# Patient Record
Sex: Female | Born: 1965 | Race: White | Hispanic: No | Marital: Single | Smoking: Former smoker
Health system: Southern US, Community
[De-identification: ages and names within clinical notes are randomized; demographics above are authoritative.]

## PROBLEM LIST (undated history)

## (undated) DIAGNOSIS — I7300 Raynaud's syndrome without gangrene: Secondary | ICD-10-CM

## (undated) DIAGNOSIS — N83299 Other ovarian cyst, unspecified side: Secondary | ICD-10-CM

## (undated) DIAGNOSIS — B029 Zoster without complications: Secondary | ICD-10-CM

## (undated) DIAGNOSIS — R7301 Impaired fasting glucose: Secondary | ICD-10-CM

## (undated) DIAGNOSIS — K589 Irritable bowel syndrome without diarrhea: Secondary | ICD-10-CM

## (undated) DIAGNOSIS — E7800 Pure hypercholesterolemia, unspecified: Secondary | ICD-10-CM

## (undated) DIAGNOSIS — K56609 Unspecified intestinal obstruction, unspecified as to partial versus complete obstruction: Secondary | ICD-10-CM

## (undated) DIAGNOSIS — B9689 Other specified bacterial agents as the cause of diseases classified elsewhere: Secondary | ICD-10-CM

## (undated) DIAGNOSIS — Z8719 Personal history of other diseases of the digestive system: Secondary | ICD-10-CM

## (undated) DIAGNOSIS — F329 Major depressive disorder, single episode, unspecified: Secondary | ICD-10-CM

## (undated) DIAGNOSIS — J45909 Unspecified asthma, uncomplicated: Secondary | ICD-10-CM

## (undated) DIAGNOSIS — F32A Depression, unspecified: Secondary | ICD-10-CM

## (undated) DIAGNOSIS — R011 Cardiac murmur, unspecified: Secondary | ICD-10-CM

## (undated) DIAGNOSIS — G4730 Sleep apnea, unspecified: Secondary | ICD-10-CM

## (undated) DIAGNOSIS — N816 Rectocele: Secondary | ICD-10-CM

## (undated) DIAGNOSIS — R7303 Prediabetes: Secondary | ICD-10-CM

## (undated) DIAGNOSIS — M1990 Unspecified osteoarthritis, unspecified site: Secondary | ICD-10-CM

## (undated) DIAGNOSIS — N760 Acute vaginitis: Secondary | ICD-10-CM

## (undated) DIAGNOSIS — E559 Vitamin D deficiency, unspecified: Secondary | ICD-10-CM

## (undated) DIAGNOSIS — IMO0002 Reserved for concepts with insufficient information to code with codable children: Secondary | ICD-10-CM

## (undated) DIAGNOSIS — K219 Gastro-esophageal reflux disease without esophagitis: Secondary | ICD-10-CM

## (undated) DIAGNOSIS — M797 Fibromyalgia: Secondary | ICD-10-CM

## (undated) DIAGNOSIS — R42 Dizziness and giddiness: Secondary | ICD-10-CM

## (undated) DIAGNOSIS — B379 Candidiasis, unspecified: Secondary | ICD-10-CM

## (undated) DIAGNOSIS — R339 Retention of urine, unspecified: Secondary | ICD-10-CM

## (undated) DIAGNOSIS — Z205 Contact with and (suspected) exposure to viral hepatitis: Secondary | ICD-10-CM

## (undated) DIAGNOSIS — I10 Essential (primary) hypertension: Secondary | ICD-10-CM

## (undated) DIAGNOSIS — D4701 Cutaneous mastocytosis: Secondary | ICD-10-CM

## (undated) DIAGNOSIS — N951 Menopausal and female climacteric states: Secondary | ICD-10-CM

## (undated) DIAGNOSIS — R002 Palpitations: Secondary | ICD-10-CM

## (undated) DIAGNOSIS — F419 Anxiety disorder, unspecified: Secondary | ICD-10-CM

## (undated) DIAGNOSIS — R079 Chest pain, unspecified: Secondary | ICD-10-CM

## (undated) DIAGNOSIS — N815 Vaginal enterocele: Secondary | ICD-10-CM

## (undated) DIAGNOSIS — Z974 Presence of external hearing-aid: Secondary | ICD-10-CM

## (undated) DIAGNOSIS — E039 Hypothyroidism, unspecified: Secondary | ICD-10-CM

## (undated) DIAGNOSIS — M7918 Myalgia, other site: Secondary | ICD-10-CM

## (undated) DIAGNOSIS — M75100 Unspecified rotator cuff tear or rupture of unspecified shoulder, not specified as traumatic: Secondary | ICD-10-CM

## (undated) DIAGNOSIS — R638 Other symptoms and signs concerning food and fluid intake: Secondary | ICD-10-CM

## (undated) DIAGNOSIS — U071 COVID-19: Secondary | ICD-10-CM

## (undated) DIAGNOSIS — N200 Calculus of kidney: Secondary | ICD-10-CM

## (undated) DIAGNOSIS — E876 Hypokalemia: Secondary | ICD-10-CM

## (undated) DIAGNOSIS — J3489 Other specified disorders of nose and nasal sinuses: Secondary | ICD-10-CM

## (undated) DIAGNOSIS — J449 Chronic obstructive pulmonary disease, unspecified: Secondary | ICD-10-CM

## (undated) HISTORY — DX: Anxiety disorder, unspecified: F41.9

## (undated) HISTORY — PX: TONSILLECTOMY: SUR1361

## (undated) HISTORY — PX: APPENDECTOMY: SHX54

## (undated) HISTORY — DX: Acute vaginitis: N76.0

## (undated) HISTORY — DX: Sleep apnea, unspecified: G47.30

## (undated) HISTORY — PX: LITHOTRIPSY: SUR834

## (undated) HISTORY — DX: Cutaneous mastocytosis: D47.01

## (undated) HISTORY — DX: Vaginal enterocele: N81.5

## (undated) HISTORY — DX: Unspecified osteoarthritis, unspecified site: M19.90

## (undated) HISTORY — DX: Pure hypercholesterolemia, unspecified: E78.00

## (undated) HISTORY — DX: Rectocele: N81.6

## (undated) HISTORY — DX: Chest pain, unspecified: R07.9

## (undated) HISTORY — DX: Depression, unspecified: F32.A

## (undated) HISTORY — PX: UPPER GASTROINTESTINAL ENDOSCOPY: SHX188

## (undated) HISTORY — DX: Unspecified intestinal obstruction, unspecified as to partial versus complete obstruction: K56.609

## (undated) HISTORY — DX: Vitamin D deficiency, unspecified: E55.9

## (undated) HISTORY — PX: OOPHORECTOMY: SHX86

## (undated) HISTORY — DX: Other specified disorders of nose and nasal sinuses: J34.89

## (undated) HISTORY — PX: OTHER SURGICAL HISTORY: SHX169

## (undated) HISTORY — PX: COLONOSCOPY: SHX174

## (undated) HISTORY — DX: Essential (primary) hypertension: I10

## (undated) HISTORY — PX: ABDOMINAL HYSTERECTOMY: SHX81

## (undated) HISTORY — DX: Other symptoms and signs concerning food and fluid intake: R63.8

## (undated) HISTORY — DX: Fibromyalgia: M79.7

## (undated) HISTORY — DX: Menopausal and female climacteric states: N95.1

## (undated) HISTORY — DX: Retention of urine, unspecified: R33.9

## (undated) HISTORY — DX: Candidiasis, unspecified: B37.9

## (undated) HISTORY — PX: ANKLE SURGERY: SHX546

## (undated) HISTORY — DX: Prediabetes: R73.03

## (undated) HISTORY — DX: Gastro-esophageal reflux disease without esophagitis: K21.9

## (undated) HISTORY — DX: Contact with and (suspected) exposure to viral hepatitis: Z20.5

## (undated) HISTORY — DX: Raynaud's syndrome without gangrene: I73.00

## (undated) HISTORY — DX: Hypokalemia: E87.6

## (undated) HISTORY — DX: Reserved for concepts with insufficient information to code with codable children: IMO0002

## (undated) HISTORY — DX: Impaired fasting glucose: R73.01

## (undated) HISTORY — DX: Palpitations: R00.2

## (undated) HISTORY — PX: PARTIAL HYSTERECTOMY: SHX80

## (undated) HISTORY — DX: Major depressive disorder, single episode, unspecified: F32.9

## (undated) HISTORY — DX: Other specified bacterial agents as the cause of diseases classified elsewhere: B96.89

---
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
## 2004-10-14 ENCOUNTER — Ambulatory Visit: Payer: Self-pay | Admitting: Family Medicine

## 2004-11-23 ENCOUNTER — Ambulatory Visit: Payer: Self-pay | Admitting: Family Medicine

## 2005-02-18 ENCOUNTER — Ambulatory Visit: Payer: Self-pay | Admitting: Family Medicine

## 2005-08-16 ENCOUNTER — Ambulatory Visit: Payer: Self-pay | Admitting: Family Medicine

## 2005-09-13 ENCOUNTER — Ambulatory Visit: Payer: Self-pay | Admitting: Family Medicine

## 2005-10-04 ENCOUNTER — Ambulatory Visit: Payer: Self-pay | Admitting: Family Medicine

## 2005-12-23 ENCOUNTER — Ambulatory Visit: Payer: Self-pay | Admitting: Family Medicine

## 2008-07-25 ENCOUNTER — Encounter: Admission: RE | Admit: 2008-07-25 | Discharge: 2008-07-25 | Payer: Self-pay | Admitting: Family Medicine

## 2013-08-31 DIAGNOSIS — G4700 Insomnia, unspecified: Secondary | ICD-10-CM | POA: Insufficient documentation

## 2013-11-01 HISTORY — PX: COLPORRHAPHY: SHX921

## 2014-02-13 ENCOUNTER — Ambulatory Visit: Payer: Self-pay | Admitting: Obstetrics and Gynecology

## 2014-02-13 LAB — BASIC METABOLIC PANEL
ANION GAP: 6 — AB (ref 7–16)
BUN: 26 mg/dL — ABNORMAL HIGH (ref 7–18)
CALCIUM: 10.3 mg/dL — AB (ref 8.5–10.1)
CHLORIDE: 94 mmol/L — AB (ref 98–107)
CO2: 36 mmol/L — AB (ref 21–32)
Creatinine: 1.03 mg/dL (ref 0.60–1.30)
Glucose: 116 mg/dL — ABNORMAL HIGH (ref 65–99)
Osmolality: 278 (ref 275–301)
Potassium: 2.6 mmol/L — ABNORMAL LOW (ref 3.5–5.1)
SODIUM: 136 mmol/L (ref 136–145)

## 2014-02-13 LAB — CBC
HCT: 43.6 % (ref 35.0–47.0)
HGB: 14.3 g/dL (ref 12.0–16.0)
MCH: 27.9 pg (ref 26.0–34.0)
MCHC: 32.8 g/dL (ref 32.0–36.0)
MCV: 85 fL (ref 80–100)
Platelet: 408 10*3/uL (ref 150–440)
RBC: 5.12 10*6/uL (ref 3.80–5.20)
RDW: 13.9 % (ref 11.5–14.5)
WBC: 8.3 10*3/uL (ref 3.6–11.0)

## 2014-02-15 ENCOUNTER — Emergency Department: Payer: Self-pay | Admitting: Emergency Medicine

## 2014-02-15 LAB — BASIC METABOLIC PANEL
ANION GAP: 8 (ref 7–16)
BUN: 27 mg/dL — ABNORMAL HIGH (ref 7–18)
CALCIUM: 11.1 mg/dL — AB (ref 8.5–10.1)
CHLORIDE: 93 mmol/L — AB (ref 98–107)
CREATININE: 0.94 mg/dL (ref 0.60–1.30)
Co2: 35 mmol/L — ABNORMAL HIGH (ref 21–32)
EGFR (Non-African Amer.): >60
Glucose: 121 mg/dL — ABNORMAL HIGH (ref 65–99)
OSMOLALITY: 278 (ref 275–301)
Potassium: 2.6 mmol/L — ABNORMAL LOW (ref 3.5–5.1)
SODIUM: 136 mmol/L (ref 136–145)

## 2014-02-15 LAB — TROPONIN I: Troponin-I: <0.02 ng/mL

## 2014-02-15 LAB — MAGNESIUM: Magnesium: 1.5 mg/dL — ABNORMAL LOW

## 2014-02-15 LAB — POTASSIUM: Potassium: 3.2 mmol/L — ABNORMAL LOW (ref 3.5–5.1)

## 2014-02-18 ENCOUNTER — Ambulatory Visit: Payer: Self-pay | Admitting: Obstetrics and Gynecology

## 2014-02-19 LAB — ELECTROLYTE PANEL
ANION GAP: 8 (ref 7–16)
CHLORIDE: 102 mmol/L (ref 98–107)
CO2: 27 mmol/L (ref 21–32)
Potassium: 3.5 mmol/L (ref 3.5–5.1)
SODIUM: 137 mmol/L (ref 136–145)

## 2014-02-19 LAB — HEMOGLOBIN: HGB: 11.2 g/dL — AB (ref 12.0–16.0)

## 2014-02-21 LAB — URINE CULTURE

## 2014-06-12 ENCOUNTER — Inpatient Hospital Stay: Payer: Self-pay | Admitting: Internal Medicine

## 2014-06-12 LAB — MAGNESIUM
MAGNESIUM: 1 mg/dL — AB
Magnesium: 1.9 mg/dL

## 2014-06-12 LAB — DIFFERENTIAL
Basophil #: 0 10*3/uL (ref 0.0–0.1)
Basophil %: 0.2 %
Eosinophil #: 0 10*3/uL (ref 0.0–0.7)
Eosinophil %: 0.3 %
LYMPHS PCT: 17.6 %
Lymphocyte #: 2.3 10*3/uL (ref 1.0–3.6)
Monocyte #: 0.6 x10 3/mm (ref 0.2–0.9)
Monocyte %: 4.5 %
NEUTROS PCT: 77.4 %
Neutrophil #: 10 10*3/uL — ABNORMAL HIGH (ref 1.4–6.5)

## 2014-06-12 LAB — TROPONIN I
TROPONIN-I: 0.04 ng/mL
Troponin-I: 0.04 ng/mL
Troponin-I: 0.04 ng/mL

## 2014-06-12 LAB — CBC
HCT: 42.7 % (ref 35.0–47.0)
HGB: 14 g/dL (ref 12.0–16.0)
MCH: 27.7 pg (ref 26.0–34.0)
MCHC: 32.7 g/dL (ref 32.0–36.0)
MCV: 85 fL (ref 80–100)
Platelet: 374 10*3/uL (ref 150–440)
RBC: 5.04 10*6/uL (ref 3.80–5.20)
RDW: 13.8 % (ref 11.5–14.5)
WBC: 13 10*3/uL — AB (ref 3.6–11.0)

## 2014-06-12 LAB — BASIC METABOLIC PANEL
Anion Gap: 10 (ref 7–16)
BUN: 15 mg/dL (ref 7–18)
CALCIUM: 8.8 mg/dL (ref 8.5–10.1)
CHLORIDE: 91 mmol/L — AB (ref 98–107)
CREATININE: 1 mg/dL (ref 0.60–1.30)
Co2: 34 mmol/L — ABNORMAL HIGH (ref 21–32)
GLUCOSE: 148 mg/dL — AB (ref 65–99)
Osmolality: 274 (ref 275–301)
Potassium: 2 mmol/L — CL (ref 3.5–5.1)
Sodium: 135 mmol/L — ABNORMAL LOW (ref 136–145)

## 2014-06-12 LAB — CK-MB: CK-MB: <0.5 ng/mL — ABNORMAL LOW (ref 0.5–3.6)

## 2014-06-12 LAB — HEMOGLOBIN: HGB: 13 g/dL (ref 12.0–16.0)

## 2014-06-13 LAB — BASIC METABOLIC PANEL
ANION GAP: 7 (ref 7–16)
BUN: 14 mg/dL (ref 7–18)
CHLORIDE: 102 mmol/L (ref 98–107)
Calcium, Total: 7.9 mg/dL — ABNORMAL LOW (ref 8.5–10.1)
Co2: 32 mmol/L (ref 21–32)
Creatinine: 0.77 mg/dL (ref 0.60–1.30)
GLUCOSE: 103 mg/dL — AB (ref 65–99)
OSMOLALITY: 282 (ref 275–301)
Potassium: 3.8 mmol/L (ref 3.5–5.1)
Sodium: 141 mmol/L (ref 136–145)

## 2014-06-13 LAB — CBC WITH DIFFERENTIAL/PLATELET
BASOS ABS: 0 10*3/uL (ref 0.0–0.1)
BASOS PCT: 0.3 %
EOS PCT: 1.3 %
Eosinophil #: 0.1 10*3/uL (ref 0.0–0.7)
HCT: 38.9 % (ref 35.0–47.0)
HGB: 13.2 g/dL (ref 12.0–16.0)
LYMPHS PCT: 35.7 %
Lymphocyte #: 3.6 10*3/uL (ref 1.0–3.6)
MCH: 28.5 pg (ref 26.0–34.0)
MCHC: 33.9 g/dL (ref 32.0–36.0)
MCV: 84 fL (ref 80–100)
MONO ABS: 0.6 x10 3/mm (ref 0.2–0.9)
Monocyte %: 6.1 %
NEUTROS PCT: 56.6 %
Neutrophil #: 5.6 10*3/uL (ref 1.4–6.5)
Platelet: 347 10*3/uL (ref 150–440)
RBC: 4.61 10*6/uL (ref 3.80–5.20)
RDW: 13.8 % (ref 11.5–14.5)
WBC: 10 10*3/uL (ref 3.6–11.0)

## 2014-06-13 LAB — TSH: THYROID STIMULATING HORM: 2.85 u[IU]/mL

## 2014-06-13 LAB — CK-MB: CK-MB: 0.6 ng/mL (ref 0.5–3.6)

## 2014-06-13 LAB — MAGNESIUM: MAGNESIUM: 1.8 mg/dL

## 2014-12-03 DIAGNOSIS — E039 Hypothyroidism, unspecified: Secondary | ICD-10-CM | POA: Insufficient documentation

## 2014-12-03 DIAGNOSIS — J4540 Moderate persistent asthma, uncomplicated: Secondary | ICD-10-CM | POA: Insufficient documentation

## 2014-12-03 DIAGNOSIS — I10 Essential (primary) hypertension: Secondary | ICD-10-CM | POA: Insufficient documentation

## 2014-12-03 DIAGNOSIS — E559 Vitamin D deficiency, unspecified: Secondary | ICD-10-CM | POA: Insufficient documentation

## 2014-12-03 DIAGNOSIS — J45901 Unspecified asthma with (acute) exacerbation: Secondary | ICD-10-CM | POA: Insufficient documentation

## 2014-12-03 DIAGNOSIS — F419 Anxiety disorder, unspecified: Secondary | ICD-10-CM | POA: Insufficient documentation

## 2014-12-03 DIAGNOSIS — J4530 Mild persistent asthma, uncomplicated: Secondary | ICD-10-CM | POA: Insufficient documentation

## 2014-12-03 DIAGNOSIS — J4520 Mild intermittent asthma, uncomplicated: Secondary | ICD-10-CM | POA: Insufficient documentation

## 2014-12-03 HISTORY — DX: Vitamin D deficiency, unspecified: E55.9

## 2015-02-21 DIAGNOSIS — R339 Retention of urine, unspecified: Secondary | ICD-10-CM | POA: Insufficient documentation

## 2015-02-21 DIAGNOSIS — F419 Anxiety disorder, unspecified: Secondary | ICD-10-CM

## 2015-02-21 DIAGNOSIS — N951 Menopausal and female climacteric states: Secondary | ICD-10-CM | POA: Insufficient documentation

## 2015-02-21 DIAGNOSIS — F329 Major depressive disorder, single episode, unspecified: Secondary | ICD-10-CM | POA: Insufficient documentation

## 2015-02-21 DIAGNOSIS — E669 Obesity, unspecified: Secondary | ICD-10-CM | POA: Insufficient documentation

## 2015-02-21 DIAGNOSIS — N815 Vaginal enterocele: Secondary | ICD-10-CM | POA: Insufficient documentation

## 2015-02-21 DIAGNOSIS — IMO0002 Reserved for concepts with insufficient information to code with codable children: Secondary | ICD-10-CM | POA: Insufficient documentation

## 2015-02-21 DIAGNOSIS — N816 Rectocele: Secondary | ICD-10-CM | POA: Insufficient documentation

## 2015-02-22 NOTE — H&P (Signed)
PATIENT NAME:  Laura [name], Laura [name] MR#:  [id number] DATE OF BIRTH:  1966-07-02  DATE OF ADMISSION:  06/12/2014  PRIMARY CARE PROVIDER: [facility].  EMERGENCY DEPARTMENT REFERRING PHYSICIAN: Dr. Conni Slipper.   CHIEF COMPLAINT: Weakness, chest pain, and leg cramps.   HISTORY OF PRESENT ILLNESS: The patient is a 49 year old white female with history of hyperlipidemia, chronic lower extremity edema, hypothyroidism, hypertension, asthma, anxiety, and panic attacks who is on chronic Lasix therapy for lower extremity swelling, who states that she has been admitted multiple times to [facility], as well as hospital in [place] for severe hypokalemia related to her diuretic.  She reports that whenever her potassium decreases, she starts having chest pain, leg cramps, and weakness. She started having these symptoms since yesterday. She started having leg cramps yesterday and then started feeling very weak around 11:30 a.m. When patient arrived to the ED, her potassium was noted to be 2.0 and her magnesium was 1.0. The patient, otherwise, denies any fevers or chills. No shortness of breath. She does complain of substernal chest pain which she states that she gets whenever she has her potassium low like this. She has had some nausea, but no vomiting or diarrhea.  PAST MEDICAL HISTORY: Significant for:  1.  Hyperlipidemia. 2.  History of chronic lower extremity edema.  3.  Hypothyroidism.  4.  Hypertension.  5.  Asthma.  6.  History of anxiety.  7.  History of panic attacks.   PAST SURGICAL HISTORY:  1.  Status post ankle surgery.  2.  Partial hysterectomy.  3.  Appendectomy.  4.  D and C. 5.  Bilateral tubal ligation. 6.  Thumb surgery. 7.  Lithotripsy x 17. 8.  Tonsillectomy.   ALLERGIES: SHELLFISH, DEMEROL, AND CODEINE.   CURRENT MEDICATIONS:  1.  Xanax 1 mg tablet at bedtime. 2.  Wellbutrin 300 mg 1 tab p.o. at bedtime. 3.  Voltaren topically applied to affected area  as needed. 4.  Vitamin D2 50,000 international units once a week.  5.  Tramadol 50 q. 4 p.r.n..  6.  Protonix 40 daily.  7.  Lipitor 40 at bedtime.  8.  Levothyroxine 112 mcg daily.  9.  Lasix 60 one tab p.o. b.i.d. 10.  Klor-Con 20 mEq 2 tabs 4 times a day. 11.  Diovan 160 daily.  12.  Chlorthalidone 25 mg 1 tab p.o. daily as needed.  13.  Adderall 20 mg 1 tab p.o. b.i.d. p.r.n.   SOCIAL HISTORY: Does not smoke. Does not drink. No drugs.   FAMILY HISTORY: Positive for hypertension.   REVIEW OF SYSTEMS:  CONSTITUTIONAL: Complains of fatigue and weakness. No pain. No weight loss. No weight gain.  EYES: No blurred or double vision. No pain. No redness. No inflammation. No glaucoma. No cataracts.  EAR, NOSE, AND THROAT: No ear pain. No hearing loss. No seasonal or year-round allergies. No difficulty swallowing.  RESPIRATORY: Denies any cough, wheezing, or hemoptysis.  CARDIOVASCULAR: Complains of chest pain, but no orthopnea, edema, or arrhythmia.  GASTROINTESTINAL: Complains of some nausea, but no vomiting or diarrhea. No abdominal pain. No hematemesis. No melena. No ulcer. No GERD. No jaundice.  GENITOURINARY: Denies any dysuria, hematuria, renal calculus or frequency.  ENDOCRINE: Denies any polyuria or nocturia. Does have hypothyroidism.  HEMATOLOGY AND LYMPHATICS: Denies anemia, easy bruisability or bleeding.  SKIN: No acne. No rash.  MUSCULOSKELETAL: Denies any pain in the neck, back, or shoulder.  NEUROLOGIC: No numbness, CVA, TIA, or seizures.  PSYCHIATRIC: No  anxiety, insomnia, or ADD.   PHYSICAL EXAMINATION:  VITAL SIGNS: Temperature 97.8, pulse 88, respirations 18, blood pressure 156/86, oxygen saturation 95%.  GENERAL: The patient is an obese female in no acute distress.  HEENT: Head atraumatic, normocephalic. Pupils equally round, reactive to light and accommodation. There is no conjunctival pallor. No scleral icterus. Nasal exam shows no drainage or ulceration. Oropharynx  clear without any exudate.  NECK: Supple without any JVD.  CARDIOVASCULAR: Regular rate and rhythm. No murmurs, rubs, clicks, or gallops. PMI this is not displaced.  LUNGS: Clear to auscultation bilaterally without any rales, rhonchi, wheezing.  ABDOMEN: Soft, nontender, nondistended. Positive bowel sounds x 4.  EXTREMITIES: No clubbing, cyanosis, or edema.  SKIN: No rash.  LYMPHATICS: No lymph nodes palpable.  VASCULAR: Good DP, PT pulses.  PSYCHIATRIC: Not anxious or depressed.  NEUROLOGIC: Awake, alert, oriented x 3. No focal deficits.   EVALUATIONS:  Glucose 148, BUN 15, creatinine 1.0, sodium 135, potassium 2.0, chloride 91, CO2 is 34, calcium is 8.8, magnesium 1.0, troponin 0.04. WBC 13.0, hemoglobin 14, platelet count 374,000. EKG shows slightly prolonged QTc and no T-wave changes. No ST changes.   ASSESSMENT AND PLAN: The patient is a 49 year old white female with history of recurrent hypokalemia, presents with weakness, leg cramps, and chest pain.   1. Weakness and leg cramps, likely due to severe hypokalemia and hypomagnesemia. At this time, magnesium has been given IV. We will start her on p.o. magnesium. We will also start her on give her IV KCL x 1, followed by p.o. potassium. We will check a BMP every 8 hours.   2.  Chest pain, possibly due to hypokalemia. Reports that she has been worked up in the past with stress test, which has been negative. Her symptoms are resolved with replacement of the potassium.   3.  Hypothyroidism. Continue Synthroid.   4.  Hypertension. We will continue Diovan.   5.  Hyperlipidemia. We will continue atorvastatin as taking at home.   6.  Miscellaneous: We will do Lovenox for deep vein thrombosis prophylaxis.   TIME SPENT ON THIS PATIENT: 55 minutes.     ____________________________ Lafonda Mosses Posey Pronto, MD shp:TT D: 06/12/2014 16:35:48 ET T: 06/12/2014 16:52:05 ET JOB#: [id number]  cc: Cayne Yom H. Posey Pronto, MD, <Dictator> Alric Seton  MD ELECTRONICALLY SIGNED 06/18/2014 14:19

## 2015-02-22 NOTE — Discharge Summary (Signed)
PATIENT NAME:  Laura [name], Laura [name] MR#:  [id number] DATE OF BIRTH:  02/11/1966  DATE OF ADMISSION:  06/12/2014 DATE OF DISCHARGE:  06/13/2014  PRIMARY CARE PHYSICIAN: Eulogio Bear, FNP at [facility].   FINAL DIAGNOSES:  1.  Hypokalemia, hypomagnesemia.  2.  Chest pain.  3.  Hypertension.  4.  Hypothyroidism.  5.  Anxiety. I do recommend checking electrolytes weekly.   MEDICATIONS ON DISCHARGE: Include Lipitor 40 mg at bedtime, Xanax 1 mg at bedtime, levothyroxine 112 mcg daily, Voltaren topical 1% gel applied topically to affected area as needed, Protonix 40 mg at bedtime, Wellbutrin 300 mg per 24 hours 1 tablet once a day at bedtime, vitamin D2 at 50,000 international units 1 capsule per week, tramadol 50 mg every 4 hours as needed, Adderall 20 mg 1 tablet twice a day as needed, Klor-Con 20 mEq 2 tablets 4 times a day, Lasix 80 mg twice a day, magnesium oxide 400 mg every 8 hours. Stop taking Diovan and chlorthalidone.   HOME OXYGEN: No.   DIET: Low-sodium diet, regular consistency.   ACTIVITY: As tolerated.   FOLLOWUP: With Eulogio Bear, FNP in 1-2 weeks.   HOSPITAL COURSE: The patient was admitted 06/12/2014 and discharged 06/13/2014, came in with weakness, chest pain, leg cramps, found to have low potassium and magnesium, was started on IV and oral replacement. The chest pain was believed secondary to the hypokalemia. Had a stress test in the past which was negative.   LABORATORY AND RADIOLOGICAL DATA: EKG: Normal sinus rhythm. Magnesium was 1.0, glucose 148, BUN 15, creatinine 1.0, sodium 135, potassium 3.0, chloride 91, CO2 of 34, calcium 8.8. White blood cell count 13.0, hemoglobin and hematocrit 14.0 and 42.7, platelet count of 374,000. Cardiac enzymes x 3 were negative. Magnesium replaced up to 1.9. TSH 2.85. Potassium upon discharge 3.8.   HOSPITAL COURSE PER PROBLEM LIST:  1.  For the patient's severe hypokalemia, hypomagnesemia, weakness, electrolytes were replaced and  controlled by the next day and the patient was sent home in stable condition. I told her to stop the chlorthalidone. The patient insists that she must be on the Lasix for lower extremity edema. This means the patient has to check electrolytes on a weekly basis to make sure that they are stable and the patient must have oral supplementation.  2.  Chest pain resolved. Enzymes negative.  3.  Hypertension. Blood pressure on the lower side, stopped the Diovan and the chlorthalidone.  4.  Hypothyroidism. TSH normal range, on levothyroxine.  5.  Anxiety, on numerous psychiatric medications.  TIME SPENT ON DISCHARGE: Thirty-five minutes.    ____________________________ Tana Conch. Leslye Peer, MD rjw:TT D: 06/13/2014 14:12:48 ET T: 06/13/2014 15:24:23 ET JOB#: [id number]  cc: Tana Conch. Leslye Peer, MD, <Dictator> [place], FNP  Marisue Brooklyn MD ELECTRONICALLY SIGNED 07/05/2014 15:02

## 2015-02-22 NOTE — Op Note (Signed)
PATIENT NAME:  Laura [name], Laura [name] MR#:  [id number] DATE OF BIRTH:  Dec 08, 1965  DATE OF PROCEDURE:  02/18/2014  PREOPERATIVE DIAGNOSIS: 1.  Pelvic relaxation.   POSTOPERATIVE DIAGNOSES: 1.  High rectocele.  2.  Enterocele.   SURGICAL PROCEDURE:  Posterior colporrhaphy with enterocele ligation.   SURGEON: Alanda Slim. DeFrancesco, M.D.   FIRST ASSISTANT: Dr. Marcelline Mates.   SECOND ASSISTANT  Fredrik Cove, PA-S.   ANESTHESIA: General endotracheal.   INDICATIONS: The patient is a 49 year old white female status post hysterectomy in the past, who presents for surgical management of symptomatic pelvic relaxation. The patient had unsuccessful pessary trial. She does have high rectocele and enterocele on exam.   FINDINGS AT SURGERY: Revealed the high rectocele and enterocele. There was moderate scarring at the apex of the vagina. The enterocele sac was not entered. Two pursestring sutures were used to reduce the enterocele, and a good posterior shelf was created with the posterior colporrhaphy.   DESCRIPTION OF PROCEDURE: The patient was brought to the operating room where she was placed in the supine position. General endotracheal anesthesia was induced without difficulty. She was placed in the dorsal lithotomy position using candy cane stirrups. A Betadine perineal and intravaginal prep and drape was performed in standard fashion. A Foley catheter was placed and was draining clear yellow urine from the bladder. The lateral aspects of the introitus were grasped with Allis clamps. A triangular wedge of vaginal mucosa posteriorly was excised from the introitus. Metzenbaum scissors were used to undermine the vaginal mucosa in the midline, and this tissue was incised. Allis-Adair retractors were used to facilitate exposure. The sequential undermining and incising technique was carried out up to the vaginal apex. The perirectal fascia was dissected from the vagina through sharp and blunt dissection. Once adequately  mobilized, there appeared to be persistent scarring up at the apex of the vagina and decision was made not to open the enterocele sac.  Two pursestring sutures of 0 Vicryl were then placed to reduce the high enterocele. With successive sutures, the enterocele defect was reduced. The posterior colporrhaphy was then performed using horizontal mattress stitches of 0 Vicryl. Once this was completed, the excess vaginal mucosa was trimmed and the vagina was reapproximated in the midline. Following completion of the closure of the vaginal mucosa, the vagina was packed with Premarin-coated Kerlix guide. The patient was then awakened, extubated and taken to the recovery room in satisfactory condition.   ESTIMATED BLOOD LOSS: 100 mL.   INTRAVENOUS FLUIDS: 800 mL.   URINE OUTPUT: 200 mL.   No antibiotics were given.     ____________________________ Alanda Slim. DeFrancesco, MD mad:dmm D: 02/18/2014 12:14:30 ET T: 02/18/2014 12:25:19 ET JOB#: [id number]  cc: Hassell Done A. DeFrancesco, MD, <Dictator> Alanda Slim DEFRANCESCO MD ELECTRONICALLY SIGNED 02/21/2014 12:00

## 2015-04-02 ENCOUNTER — Encounter: Payer: Self-pay | Admitting: Obstetrics and Gynecology

## 2015-04-02 ENCOUNTER — Other Ambulatory Visit: Payer: Self-pay | Admitting: Family Medicine

## 2015-04-02 DIAGNOSIS — E039 Hypothyroidism, unspecified: Secondary | ICD-10-CM

## 2015-04-02 DIAGNOSIS — E876 Hypokalemia: Secondary | ICD-10-CM

## 2015-04-25 ENCOUNTER — Other Ambulatory Visit: Payer: Self-pay | Admitting: Family Medicine

## 2015-05-20 ENCOUNTER — Encounter: Payer: Self-pay | Admitting: Obstetrics and Gynecology

## 2015-05-27 ENCOUNTER — Other Ambulatory Visit: Payer: Self-pay | Admitting: Family Medicine

## 2015-05-27 DIAGNOSIS — E876 Hypokalemia: Secondary | ICD-10-CM

## 2015-06-06 ENCOUNTER — Ambulatory Visit (INDEPENDENT_AMBULATORY_CARE_PROVIDER_SITE_OTHER): Payer: BLUE CROSS/BLUE SHIELD | Admitting: Family Medicine

## 2015-06-06 ENCOUNTER — Encounter: Payer: Self-pay | Admitting: Family Medicine

## 2015-06-06 VITALS — BP 136/74 | HR 78 | Temp 98.0°F | Resp 18 | Wt 192.3 lb

## 2015-06-06 DIAGNOSIS — I10 Essential (primary) hypertension: Secondary | ICD-10-CM | POA: Diagnosis not present

## 2015-06-06 DIAGNOSIS — R600 Localized edema: Secondary | ICD-10-CM | POA: Diagnosis not present

## 2015-06-06 DIAGNOSIS — E039 Hypothyroidism, unspecified: Secondary | ICD-10-CM | POA: Diagnosis not present

## 2015-06-06 DIAGNOSIS — L2989 Other pruritus: Secondary | ICD-10-CM

## 2015-06-06 DIAGNOSIS — G8929 Other chronic pain: Secondary | ICD-10-CM

## 2015-06-06 DIAGNOSIS — M159 Polyosteoarthritis, unspecified: Secondary | ICD-10-CM | POA: Diagnosis not present

## 2015-06-06 DIAGNOSIS — M2550 Pain in unspecified joint: Secondary | ICD-10-CM | POA: Diagnosis not present

## 2015-06-06 DIAGNOSIS — E8941 Symptomatic postprocedural ovarian failure: Secondary | ICD-10-CM

## 2015-06-06 DIAGNOSIS — L298 Other pruritus: Secondary | ICD-10-CM

## 2015-06-06 DIAGNOSIS — E876 Hypokalemia: Secondary | ICD-10-CM

## 2015-06-06 DIAGNOSIS — N958 Other specified menopausal and perimenopausal disorders: Secondary | ICD-10-CM

## 2015-06-06 MED ORDER — TRIAMCINOLONE ACETONIDE 40 MG/ML IJ SUSP: 40.0000 mg | Freq: Once | INTRAMUSCULAR | Status: DC

## 2015-06-06 MED ORDER — NYSTATIN-TRIAMCINOLONE 100000-0.1 UNIT/GM-% EX OINT: 1.0000 "application " | TOPICAL_OINTMENT | Freq: Four times a day (QID) | CUTANEOUS | Status: DC

## 2015-06-06 MED ORDER — FLUCONAZOLE 150 MG PO TABS: ORAL_TABLET | ORAL | Status: DC

## 2015-06-06 MED ORDER — CELECOXIB 100 MG PO CAPS: 100.0000 mg | ORAL_CAPSULE | Freq: Two times a day (BID) | ORAL | Status: DC

## 2015-06-06 NOTE — Progress Notes (Signed)
Name: Laura [name]   MRN: [id number]    DOB: 10/24/1966   Date:06/06/2015       Progress Note  Subjective  Chief Complaint  Chief Complaint  Patient presents with  . Rash    patient states the rash is recurring and she has seen dermatology for this.Patient stated that she was givne Kenalog for this and it helped. Patient stated this always happens during her cycle.    HPI  Rash: Patient complains of rash involving the abdomen, back, torso and under breasts. Rash started several months ago. Appearance of rash at onset: Texture of lesion(s): pruritic. Rash has not changed over time Initial distribution: abdomen, back, torso and under breasts.  Discomfort associated with rash: is pruritic.  Associated symptoms: arthralgia and decrease in energy level. Denies: abdominal pain, congestion, cough, fever, nausea and sore throat. Patient has had previous evaluation of rash. Patient has had previous treatment.  Response to treatment: Seen dermatology and given kenalog shot.  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 Patient has not had contacts with similar rash. Patient has not identified precipitant. Patient has not had new exposures (soaps, lotions, laundry detergents, foods, medications, plants, insects or animals.)  Edema: Patient complains of edema. The location of the edema is lower leg(s) bilateral.  The edema has been mild, moderate and off and on.  Onset of symptoms was several years ago, stable since that time. The edema is present in the evening. The patient states the problem is long-standing.  The swelling has been aggravated by dependency of involved area, relieved by diuretics, support stockings, elevation of involved area, and been associated with venous insufficiency. Cardiac risk factors include dyslipidemia, hypertension and obesity  (BMI >= 30 kg/m2).  Hypertension: Patient is here for evaluation of routine follow up of hypertention associated with propensity for hypokalemia and hypomagnesium.  Age at onset of elevated blood pressure:  Several years ago. Cardiac symptoms lower extremity edema. Patient denies chest pain, chest pressure/discomfort, claudication, irregular heart beat and palpitations.  Cardiovascular risk factors: dyslipidemia, hypertension and obesity (BMI >= 30 kg/m2). Use of agents associated with hypertension: NSAIDS and thyroid hormones. History of target organ damage: none.   Joint/Muscle Pain: Patient complains of arthralgias for which has been present for several years. Pain is located in multiple joints, is described as aching and throbbing, and is constant .  Associated symptoms include: edema, tenderness and warmth.  The patient has tried NSAIDs for pain, with minimal relief.  Related to injury:   No.  Willing to try Celebrex again.  Patient Active Problem List   Diagnosis Date Noted  . 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 Hypokalemia 06/06/2015  . Bilateral lower extremity edema 06/06/2015  . Hot flashes due to surgical menopause 06/06/2015  . Chronic pain of multiple joints 06/06/2015  . Osteoarthrosis, generalized, multiple joints 06/06/2015  . Vaginal enterocele   . Urinary retention with incomplete bladder emptying   . Vaginal dryness, menopausal   . Increased BMI   . Rectocele   . Cystocele   . Anxiety and depression   . Acquired hypothyroidism 12/03/2014  . Anxiety disorder 12/03/2014  . Essential (primary) hypertension 12/03/2014  . Asthma, mild intermittent 12/03/2014  . Avitaminosis D 12/03/2014    History  Substance Use Topics  . Smoking status: Never Smoker   . Smokeless tobacco:  Not on file  . Alcohol Use: No     Current outpatient prescriptions:  .  ALPRAZolam (XANAX) 1 MG tablet, , Disp: , Rfl: 2 .  amphetamine-dextroamphetamine (ADDERALL) 20 MG tablet, ,  Disp: , Rfl: 0 .  atorvastatin (LIPITOR) 40 MG tablet, Take 40 mg by mouth daily., Disp: , Rfl:  .  diclofenac sodium (VOLTAREN) 1 % GEL, Apply topically 2 (two) times daily., Disp: , Rfl:  .  ergocalciferol (VITAMIN D2) 50000 UNITS capsule, Take 50,000 Units by mouth once a week., Disp: , Rfl:  .  fluconazole (DIFLUCAN) 150 MG tablet, Take one tablet by mouth every 7 days for total 4 doses., Disp: 4 tablet, Rfl: 1 .  furosemide (LASIX) 80 MG tablet, , Disp: , Rfl: 1 .  KLOR-CON 25 MEQ PACK, TAKE CONTENTS OF ONE PACKET BY MOUTH TWICE DAILY MIXED WITH WATER OR BEVERAGE, Disp: 60 each, Rfl: 3 .  KLOR-CON M20 20 MEQ tablet, TAKE TWO TABLETS BY MOUTH THREE TIMES DAILY, Disp: 180 tablet, Rfl: 5 .  levothyroxine (SYNTHROID, LEVOTHROID) 125 MCG tablet, TAKE ONE TABLET BY MOUTH ONCE DAILY, Disp: 90 tablet, Rfl: 1 .  magnesium gluconate (MAGONATE) 500 MG tablet, Take 500 mg by mouth 3 (three) times daily., Disp: , Rfl:  .  nystatin-triamcinolone ointment (MYCOLOG), Apply 1 application topically 4 (four) times daily., Disp: 60 g, Rfl: 1 .  pantoprazole (PROTONIX) 40 MG tablet, Take 40 mg by mouth daily., Disp: , Rfl:  .  tiZANidine (ZANAFLEX) 4 MG capsule, Take 4 mg by mouth 3 (three) times daily as needed., Disp: , Rfl:  .  valsartan (DIOVAN) 160 MG tablet, Take 160 mg by mouth daily., Disp: , Rfl:   Current facility-administered medications:  .  triamcinolone acetonide (KENALOG-40) injection 40 mg, 40 mg, Intramuscular, Once, Bobetta Lime, MD  History reviewed. No pertinent past surgical history.  No family history on file.  Allergies  Allergen Reactions  . Meperidine Hives  . Shellfish Allergy Shortness Of Breath and Swelling     Review of Systems  CONSTITUTIONAL: No significant weight changes, fever, chills, weakness or fatigue.  HEENT:  - Eyes: No visual changes.  - Ears: No auditory changes. No pain.  - Nose: No sneezing, congestion, runny nose. - Throat: No sore throat. No changes  in swallowing. SKIN: Yes rash or itching.  CARDIOVASCULAR: No chest pain, chest pressure or chest discomfort. Yes edema.  RESPIRATORY: No shortness of breath, cough or sputum.  GASTROINTESTINAL: No anorexia, nausea, vomiting. No changes in bowel habits. No abdominal pain or blood.  GENITOURINARY: No dysuria. No frequency. No discharge.  NEUROLOGICAL: No headache, dizziness, syncope, paralysis, ataxia, numbness or tingling in the extremities. No memory changes. No change in bowel or bladder control.  MUSCULOSKELETAL: Yes joint pain. No muscle pain. HEMATOLOGIC: No anemia, bleeding or bruising.  LYMPHATICS: No enlarged lymph nodes.  PSYCHIATRIC: No change in mood. No change in sleep pattern.  ENDOCRINOLOGIC: No reports of sweating, cold or heat intolerance. No polyuria or polydipsia.     Objective  BP 136/74 mmHg  Pulse 78  Temp(Src) 98 F (36.7 C) (Oral)  Resp 18  Wt 192 lb 4.8 oz (87.227 kg)  SpO2 98%  LMP  Body mass index is 31.05 kg/(m^2).  Physical Exam  Constitutional: Patient appears well-developed and well-nourished. In no distress.  HEENT:  - Head: Normocephalic and atraumatic.  - Ears: Bilateral TMs gray, no erythema or effusion - Nose: Nasal mucosa moist - Mouth/Throat: Oropharynx is clear and moist.  No tonsillar hypertrophy or erythema. No post nasal drainage.  - Eyes: Conjunctivae clear, EOM movements normal. PERRLA. No scleral icterus.  Neck: Normal range of motion. Neck supple. No JVD present. No thyromegaly present.  Cardiovascular: Normal rate, regular rhythm and normal heart sounds.  No murmur heard.  Pulmonary/Chest: Effort normal and breath sounds normal. No respiratory distress. Musculoskeletal: Normal range of motion bilateral UE and LE, no joint effusions but does have nodules at PIP/DIP joints. Peripheral vascular: Bilateral trace pedal edema.  Neurological: CN II-XII grossly intact with no focal deficits. Alert and oriented to person, place, and time.  Coordination, balance, strength, speech and gait are normal.  Skin: Skin is warm and dry. Scattered maculopapular fine rash under breasts, on abdomen, upper back and hips and under abdomen without scaling or hives. Psychiatric: Patient has a normal mood and affect. Behavior is normal in office today. Judgment and thought content normal in office today.    Assessment & Plan  1. Acquired hypothyroidism Recheck lab work.  - Lipid panel - TSH - T3, free - T4, free - Hemoglobin A1c  2. Chronic pruritic rash in adult Will initiate work up for autoimmune disorder.  - nystatin-triamcinolone ointment (MYCOLOG); Apply 1 application topically 4 (four) times daily.  Dispense: 60 g; Refill: 1 - fluconazole (DIFLUCAN) 150 MG tablet; Take one tablet by mouth every 7 days for total 4 doses.  Dispense: 4 tablet; Refill: 1 - CBC with Differential/Platelet - Comprehensive metabolic panel - Testosterone,Free and Total - Luteinizing hormone - Follicle stimulating hormone - Hemoglobin A1c - Prolactin - DHEA-sulfate - Antinuclear Antib (ANA) - Rheumatoid factor - Estradiol  3. Essential (primary) hypertension Stable.  - CBC with Differential/Platelet - Comprehensive metabolic panel - Lipid panel - Magnesium - Hemoglobin A1c  4. Hypokalemia Recheck lab work.  - Comprehensive metabolic panel - Magnesium  5. Bilateral lower extremity edema Chronic stable problem  - Comprehensive metabolic panel - Magnesium  6. Hot flashes due to surgical menopause Start with lab work.  - Testosterone,Free and Total - Luteinizing hormone - Follicle stimulating hormone  7. Chronic pain of multiple joints Willing to re-try Celebrex.  - triamcinolone acetonide (KENALOG-40) injection 40 mg; Inject 1 mL (40 mg total) into the muscle once. - celecoxib (CELEBREX) 100 MG capsule; Take 1 capsule (100 mg total) by mouth 2 (two) times daily.  Dispense: 60 capsule; Refill: 5 - Prolactin - DHEA-sulfate -  Antinuclear Antib (ANA) - Rheumatoid factor - Estradiol  8. Osteoarthrosis, generalized, multiple joints Willing to re-try celebrex.  - triamcinolone acetonide (KENALOG-40) injection 40 mg; Inject 1 mL (40 mg total) into the muscle once. - celecoxib (CELEBREX) 100 MG capsule; Take 1 capsule (100 mg total) by mouth 2 (two) times daily.  Dispense: 60 capsule; Refill: 5

## 2015-06-07 LAB — CBC WITH DIFFERENTIAL/PLATELET
BASOS ABS: 0 10*3/uL (ref 0.0–0.2)
BASOS: 1 %
EOS (ABSOLUTE): 0.2 10*3/uL (ref 0.0–0.4)
Eos: 2 %
HEMOGLOBIN: 13.2 g/dL (ref 11.1–15.9)
Hematocrit: 39.2 % (ref 34.0–46.6)
IMMATURE GRANS (ABS): 0 10*3/uL (ref 0.0–0.1)
IMMATURE GRANULOCYTES: 0 %
LYMPHS: 26 %
Lymphocytes Absolute: 2 10*3/uL (ref 0.7–3.1)
MCH: 28.8 pg (ref 26.6–33.0)
MCHC: 33.7 g/dL (ref 31.5–35.7)
MCV: 86 fL (ref 79–97)
MONOCYTES: 7 %
Monocytes Absolute: 0.5 10*3/uL (ref 0.1–0.9)
NEUTROS PCT: 64 %
Neutrophils Absolute: 4.9 10*3/uL (ref 1.4–7.0)
PLATELETS: 365 10*3/uL (ref 150–379)
RBC: 4.58 x10E6/uL (ref 3.77–5.28)
RDW: 14.3 % (ref 12.3–15.4)
WBC: 7.6 10*3/uL (ref 3.4–10.8)

## 2015-06-07 LAB — COMPREHENSIVE METABOLIC PANEL
A/G RATIO: 1.7 (ref 1.1–2.5)
ALBUMIN: 4 g/dL (ref 3.5–5.5)
ALK PHOS: 49 IU/L (ref 39–117)
ALT: 12 IU/L (ref 0–32)
AST: 13 IU/L (ref 0–40)
BILIRUBIN TOTAL: 0.3 mg/dL (ref 0.0–1.2)
BUN/Creatinine Ratio: 16 (ref 9–23)
BUN: 15 mg/dL (ref 6–24)
CHLORIDE: 101 mmol/L (ref 97–108)
CO2: 25 mmol/L (ref 18–29)
CREATININE: 0.92 mg/dL (ref 0.57–1.00)
Calcium: 9.5 mg/dL (ref 8.7–10.2)
GFR calc non Af Amer: 73 mL/min/{1.73_m2} (ref >59)
GFR, EST AFRICAN AMERICAN: 85 mL/min/{1.73_m2} (ref >59)
GLUCOSE: 91 mg/dL (ref 65–99)
Globulin, Total: 2.4 g/dL (ref 1.5–4.5)
Potassium: 4.1 mmol/L (ref 3.5–5.2)
Sodium: 140 mmol/L (ref 134–144)
Total Protein: 6.4 g/dL (ref 6.0–8.5)

## 2015-06-07 LAB — TESTOSTERONE,FREE AND TOTAL
TESTOSTERONE: 17 ng/dL (ref 8–48)
Testosterone, Free: 1.3 pg/mL (ref 0.0–4.2)

## 2015-06-07 LAB — HEMOGLOBIN A1C
Est. average glucose Bld gHb Est-mCnc: 123 mg/dL
Hgb A1c MFr Bld: 5.9 % — ABNORMAL HIGH (ref 4.8–5.6)

## 2015-06-07 LAB — ESTRADIOL: Estradiol: 326.6 pg/mL

## 2015-06-07 LAB — T4, FREE: Free T4: 1.41 ng/dL (ref 0.82–1.77)

## 2015-06-07 LAB — RHEUMATOID FACTOR: Rheumatoid fact SerPl-aCnc: <7 [IU]/mL (ref 0.0–13.9)

## 2015-06-07 LAB — LIPID PANEL
Chol/HDL Ratio: 4.4 ratio (ref 0.0–4.4)
Cholesterol, Total: 284 mg/dL — ABNORMAL HIGH (ref 100–199)
HDL: 65 mg/dL
LDL Calculated: 182 mg/dL — ABNORMAL HIGH (ref 0–99)
Triglycerides: 185 mg/dL — ABNORMAL HIGH (ref 0–149)
VLDL Cholesterol Cal: 37 mg/dL (ref 5–40)

## 2015-06-07 LAB — MAGNESIUM: Magnesium: 2.1 mg/dL (ref 1.6–2.3)

## 2015-06-07 LAB — T3, FREE: T3, Free: 2.6 pg/mL (ref 2.0–4.4)

## 2015-06-07 LAB — DHEA-SULFATE: DHEA-SO4: 113.3 ug/dL (ref 41.2–243.7)

## 2015-06-07 LAB — FOLLICLE STIMULATING HORMONE: FSH: 5.1 m[IU]/mL

## 2015-06-07 LAB — TSH: TSH: 2.75 u[IU]/mL (ref 0.450–4.500)

## 2015-06-07 LAB — PROLACTIN: Prolactin: 9.6 ng/mL (ref 4.8–23.3)

## 2015-06-07 LAB — LUTEINIZING HORMONE: LH: 7.2 m[IU]/mL

## 2015-06-08 ENCOUNTER — Encounter: Payer: Self-pay | Admitting: Family Medicine

## 2015-06-08 LAB — ANA: ANA: NEGATIVE

## 2015-06-13 ENCOUNTER — Ambulatory Visit (INDEPENDENT_AMBULATORY_CARE_PROVIDER_SITE_OTHER): Payer: BLUE CROSS/BLUE SHIELD | Admitting: Family Medicine

## 2015-06-13 ENCOUNTER — Encounter: Payer: Self-pay | Admitting: Family Medicine

## 2015-06-13 VITALS — BP 128/82 | HR 86 | Temp 97.7°F | Resp 16 | Wt 189.0 lb

## 2015-06-13 DIAGNOSIS — R112 Nausea with vomiting, unspecified: Secondary | ICD-10-CM

## 2015-06-13 DIAGNOSIS — K529 Noninfective gastroenteritis and colitis, unspecified: Secondary | ICD-10-CM

## 2015-06-13 DIAGNOSIS — R197 Diarrhea, unspecified: Secondary | ICD-10-CM | POA: Diagnosis not present

## 2015-06-13 DIAGNOSIS — K219 Gastro-esophageal reflux disease without esophagitis: Secondary | ICD-10-CM | POA: Insufficient documentation

## 2015-06-13 MED ORDER — ONDANSETRON 8 MG PO TBDP: 8.0000 mg | ORAL_TABLET | Freq: Three times a day (TID) | ORAL | Status: DC | PRN

## 2015-06-13 NOTE — Patient Instructions (Signed)

## 2015-06-13 NOTE — Progress Notes (Signed)
Name: Laura [name]   MRN: [id number]    DOB: May 02, 1966   Date:06/13/2015       Progress Note  Subjective  Chief Complaint  Chief Complaint  Patient presents with  . GI Problem    patient presents with GI upset for about 3 days. Patient has had nausea, vomitting, diarrhea and fever. Patient stated that she ate a buffet in [facility].    HPI  Abdominal Pain: Patient complains of abdominal pain associated with nausea, vomiting diarrhea after eating at a buffet 3 days ago. The pain is described as colicky and cramping, and is 7/10 in intensity. Pain is located in the diffusely without radiation. Onset was 3 days ago. Symptoms have been gradually improving since. Aggravating factors: eating.  Alleviating factors: bowel movements. Associated symptoms: anorexia, diarrhea, nausea and vomiting. The patient denies constipation, fever, hematochezia and myalgias.    Patient Active Problem List   Diagnosis Date Noted  . 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 Hypokalemia 06/06/2015  . Bilateral lower extremity edema 06/06/2015  . Hot flashes due to surgical menopause 06/06/2015  . Chronic pain of multiple joints 06/06/2015  . Osteoarthrosis, generalized, multiple joints 06/06/2015  . Vaginal enterocele   . Urinary retention with incomplete bladder emptying   . Vaginal dryness, menopausal   . Increased BMI   . Rectocele   . Cystocele   . Anxiety and depression   . Acquired hypothyroidism 12/03/2014  . Anxiety disorder 12/03/2014  . Essential (primary) hypertension 12/03/2014  . Asthma, mild intermittent 12/03/2014  . Avitaminosis D 12/03/2014    Social History  Substance Use Topics  . Smoking status: Never Smoker   . Smokeless tobacco: Not on file  . Alcohol Use: No     Current outpatient prescriptions:  .  ALPRAZolam (XANAX) 1 MG tablet, , Disp: , Rfl: 2 .  amphetamine-dextroamphetamine (ADDERALL) 20 MG tablet, , Disp: , Rfl: 0 .  atorvastatin (LIPITOR) 40 MG tablet, Take  40 mg by mouth daily., Disp: , Rfl:  .  celecoxib (CELEBREX) 100 MG capsule, Take 1 capsule (100 mg total) by mouth 2 (two) times daily., Disp: 60 capsule, Rfl: 5 .  diclofenac sodium (VOLTAREN) 1 % GEL, Apply topically 2 (two) times daily., Disp: , Rfl:  .  ergocalciferol (VITAMIN D2) 50000 UNITS capsule, Take 50,000 Units by mouth once a week., Disp: , Rfl:  .  fluconazole (DIFLUCAN) 150 MG tablet, Take one tablet by mouth every 7 days for total 4 doses., Disp: 4 tablet, Rfl: 1 .  furosemide (LASIX) 80 MG tablet, , Disp: , Rfl: 1 .  KLOR-CON 25 MEQ PACK, TAKE CONTENTS OF ONE PACKET BY MOUTH TWICE DAILY MIXED WITH WATER OR BEVERAGE, Disp: 60 each, Rfl: 3 .  KLOR-CON M20 20 MEQ tablet, TAKE TWO TABLETS BY MOUTH THREE TIMES DAILY, Disp: 180 tablet, Rfl: 5 .  levothyroxine (SYNTHROID, LEVOTHROID) 125 MCG tablet, TAKE ONE TABLET BY MOUTH ONCE DAILY, Disp: 90 tablet, Rfl: 1 .  magnesium gluconate (MAGONATE) 500 MG tablet, Take 500 mg by mouth 3 (three) times daily., Disp: , Rfl:  .  nystatin-triamcinolone ointment (MYCOLOG), Apply 1 application topically 4 (four) times daily., Disp: 60 g, Rfl: 1 .  pantoprazole (PROTONIX) 40 MG tablet, Take 40 mg by mouth daily., Disp: , Rfl:  .  tiZANidine (ZANAFLEX) 4 MG capsule, Take 4 mg by mouth 3 (three) times daily as needed., Disp: , Rfl:  .  valsartan (DIOVAN) 160 MG tablet, Take 160 mg by  mouth daily., Disp: , Rfl:   Current facility-administered medications:  .  triamcinolone acetonide (KENALOG-40) injection 40 mg, 40 mg, Intramuscular, Once, Bobetta Lime, MD  History reviewed. No pertinent past surgical history.  History reviewed. No pertinent family history.  Allergies  Allergen Reactions  . Meperidine Hives  . Shellfish Allergy Shortness Of Breath and Swelling     Review of Systems  CONSTITUTIONAL: No significant weight changes, fever, chills. Yes weakness and fatigue.  HEENT:  - Eyes: No visual changes.  - Ears: No auditory changes. No  pain.  - Nose: No sneezing, congestion, runny nose. - Throat: No sore throat. No changes in swallowing. SKIN: No rash or itching.  CARDIOVASCULAR: No chest pain, chest pressure or chest discomfort. No palpitations or edema.  RESPIRATORY: No shortness of breath, cough or sputum.  GASTROINTESTINAL: Yes anorexia, nausea, vomiting. Yes changes in bowel habits. Yes abdominal pain.  GENITOURINARY: No dysuria. No frequency. No discharge.  NEUROLOGICAL: No headache, dizziness, syncope, paralysis, ataxia, numbness or tingling in the extremities. No memory changes. No change in bowel or bladder control.  MUSCULOSKELETAL: Chronic joint pain. No muscle pain. HEMATOLOGIC: No anemia, bleeding or bruising.  LYMPHATICS: No enlarged lymph nodes.  PSYCHIATRIC: No change in mood. No change in sleep pattern.  ENDOCRINOLOGIC: No reports of sweating, cold or heat intolerance. No polyuria or polydipsia.     Objective  BP 128/82 mmHg  Pulse 86  Temp(Src) 97.7 F (36.5 C) (Oral)  Resp 16  Wt 189 lb (85.73 kg)  SpO2 95% Body mass index is 30.52 kg/(m^2).  Physical Exam  Constitutional: Patient is obese and well-nourished. In no distress but is fatigued and laying on exam table. HEENT:  - Head: Normocephalic and atraumatic.  - Ears: Bilateral TMs gray, no erythema or effusion - Nose: Nasal mucosa moist - Mouth/Throat: Oropharynx is clear and moist. No tonsillar hypertrophy or erythema. No post nasal drainage.  - Eyes: Conjunctivae clear, EOM movements normal. PERRLA. No scleral icterus.  Neck: Normal range of motion. Neck supple. No JVD present. No thyromegaly present.  Cardiovascular: Normal rate, regular rhythm and normal heart sounds.  No murmur heard.  Pulmonary/Chest: Effort normal and breath sounds normal. No respiratory distress. Abdomen: Soft, Non tender, non distended, bowel sounds increased throughout without guarding or fluid wave. Musculoskeletal: Normal range of motion bilateral UE and LE,  no joint effusions. Peripheral vascular: Bilateral LE no edema. Neurological: CN II-XII grossly intact with no focal deficits. Alert and oriented to person, place, and time. Coordination, balance, strength, speech and gait are normal.  Skin: Skin is warm and dry. No rash noted. No erythema.  Psychiatric: Patient has a stable mood and affect. Behavior is normal in office today. Judgment and thought content normal in office today.   Recent Results (from the past 2160 hour(s))  CBC with Differential/Platelet     Status: None   Collection Time: 06/06/15 10:15 AM  Result Value Ref Range   WBC 7.6 3.4 - 10.8 x10E3/uL   RBC 4.58 3.77 - 5.28 x10E6/uL   Hemoglobin 13.2 11.1 - 15.9 g/dL   Hematocrit 39.2 34.0 - 46.6 %   MCV 86 79 - 97 fL   MCH 28.8 26.6 - 33.0 pg   MCHC 33.7 31.5 - 35.7 g/dL   RDW 14.3 12.3 - 15.4 %   Platelets 365 150 - 379 x10E3/uL   Neutrophils 64 %   Lymphs 26 %   Monocytes 7 %   Eos 2 %   Basos 1 %  Neutrophils Absolute 4.9 1.4 - 7.0 x10E3/uL   Lymphocytes Absolute 2.0 0.7 - 3.1 x10E3/uL   Monocytes Absolute 0.5 0.1 - 0.9 x10E3/uL   EOS (ABSOLUTE) 0.2 0.0 - 0.4 x10E3/uL   Basophils Absolute 0.0 0.0 - 0.2 x10E3/uL   Immature Granulocytes 0 %   Immature Grans (Abs) 0.0 0.0 - 0.1 x10E3/uL  Comprehensive metabolic panel     Status: None   Collection Time: 06/06/15 10:15 AM  Result Value Ref Range   Glucose 91 65 - 99 mg/dL   BUN 15 6 - 24 mg/dL   Creatinine, Ser 0.92 0.57 - 1.00 mg/dL   GFR calc non Af Amer 73 >59 mL/min/1.73   GFR calc Af Amer 85 >59 mL/min/1.73   BUN/Creatinine Ratio 16 9 - 23   Sodium 140 134 - 144 mmol/L   Potassium 4.1 3.5 - 5.2 mmol/L   Chloride 101 97 - 108 mmol/L   CO2 25 18 - 29 mmol/L   Calcium 9.5 8.7 - 10.2 mg/dL   Total Protein 6.4 6.0 - 8.5 g/dL   Albumin 4.0 3.5 - 5.5 g/dL   Globulin, Total 2.4 1.5 - 4.5 g/dL   Albumin/Globulin Ratio 1.7 1.1 - 2.5   Bilirubin Total 0.3 0.0 - 1.2 mg/dL   Alkaline Phosphatase 49 39 - 117 IU/L    AST 13 0 - 40 IU/L   ALT 12 0 - 32 IU/L  Lipid panel     Status: Abnormal   Collection Time: 06/06/15 10:15 AM  Result Value Ref Range   Cholesterol, Total 284 (H) 100 - 199 mg/dL   Triglycerides 185 (H) 0 - 149 mg/dL   HDL 65 >39 mg/dL    Comment: According to ATP-III Guidelines, HDL-C >59 mg/dL is considered a negative risk factor for CHD.    VLDL Cholesterol Cal 37 5 - 40 mg/dL   LDL Calculated 182 (H) 0 - 99 mg/dL   Chol/HDL Ratio 4.4 0.0 - 4.4 ratio units    Comment:                                   T. Chol/HDL Ratio                                             Men  Women                               1/2 Avg.Risk  3.4    3.3                                   Avg.Risk  5.0    4.4                                2X Avg.Risk  9.6    7.1                                3X Avg.Risk 23.4   11.0   TSH     Status: None   Collection Time: 06/06/15 10:15 AM  Result Value  Ref Range   TSH 2.750 0.450 - 4.500 uIU/mL  T3, free     Status: None   Collection Time: 06/06/15 10:15 AM  Result Value Ref Range   T3, Free 2.6 2.0 - 4.4 pg/mL  T4, free     Status: None   Collection Time: 06/06/15 10:15 AM  Result Value Ref Range   Free T4 1.41 0.82 - 1.77 ng/dL  Testosterone,Free and Total     Status: None   Collection Time: 06/06/15 10:15 AM  Result Value Ref Range   Testosterone 17 8 - 48 ng/dL   Testosterone, Free 1.3 0.0 - 4.2 pg/mL  Luteinizing hormone     Status: None   Collection Time: 06/06/15 10:15 AM  Result Value Ref Range   LH 7.2 mIU/mL    Comment:                     Follicular phase        2.4 -  12.6                     Ovulation phase        14.0 -  95.6                     Luteal phase            1.0 -  11.4                     Postmenopausal          7.7 -  19.5   Follicle stimulating hormone     Status: None   Collection Time: 06/06/15 10:15 AM  Result Value Ref Range   FSH 5.1 mIU/mL    Comment:                     Follicular phase        3.5 -  12.5                      Ovulation phase         4.7 -  21.5                     Luteal phase            1.7 -   7.7                     Postmenopausal         25.8 - 134.8   Magnesium     Status: None   Collection Time: 06/06/15 10:15 AM  Result Value Ref Range   Magnesium 2.1 1.6 - 2.3 mg/dL  Hemoglobin A1c     Status: Abnormal   Collection Time: 06/06/15 10:15 AM  Result Value Ref Range   Hgb A1c MFr Bld 5.9 (H) 4.8 - 5.6 %    Comment:          Pre-diabetes: 5.7 - 6.4          Diabetes: >6.4          Glycemic control for adults with diabetes: <7.0    Est. average glucose Bld gHb Est-mCnc 123 mg/dL  Prolactin     Status: None   Collection Time: 06/06/15 10:15 AM  Result Value Ref Range   Prolactin 9.6 4.8 - 23.3 ng/mL  DHEA-sulfate     Status: None  Collection Time: 06/06/15 10:15 AM  Result Value Ref Range   DHEA-SO4 113.3 41.2 - 243.7 ug/dL  Antinuclear Antib (ANA)     Status: None   Collection Time: 06/06/15 10:15 AM  Result Value Ref Range   Anit Nuclear Antibody(ANA) Negative Negative  Rheumatoid factor     Status: None   Collection Time: 06/06/15 10:15 AM  Result Value Ref Range   Rhuematoid fact SerPl-aCnc <7.0 0.0 - 13.9 IU/mL  Estradiol     Status: None   Collection Time: 06/06/15 10:15 AM  Result Value Ref Range   Estradiol 326.6 pg/mL    Comment:                     Adult Female:                       Follicular phase   31.4 -   166.0                       Ovulation phase    85.8 -   498.0                       Luteal phase       43.8 -   211.0                       Postmenopausal     <6.0 -    54.7                     Pregnancy                       1st trimester     215.0 - >4300.0                     Girls (1-10 years)    6.0 -    27.0 Roche ECLIA methodology      Assessment & Plan  1. Acute gastroenteritis Clinically stable. Diet modification to clear liquids advance as tolerated. May use nausea mediation. If symptoms persist by next week will order stool  studies.  - ondansetron (ZOFRAN-ODT) 8 MG disintegrating tablet; Take 1 tablet (8 mg total) by mouth every 8 (eight) hours as needed for nausea or vomiting.  Dispense: 30 tablet; Refill: 1  2. Nausea vomiting and diarrhea Use zofran prn.  - ondansetron (ZOFRAN-ODT) 8 MG disintegrating tablet; Take 1 tablet (8 mg total) by mouth every 8 (eight) hours as needed for nausea or vomiting.  Dispense: 30 tablet; Refill: 1

## 2015-06-24 ENCOUNTER — Encounter: Payer: Self-pay | Admitting: Family Medicine

## 2015-06-25 ENCOUNTER — Other Ambulatory Visit: Payer: Self-pay | Admitting: Family Medicine

## 2015-06-30 ENCOUNTER — Other Ambulatory Visit: Payer: Self-pay | Admitting: Family Medicine

## 2015-06-30 DIAGNOSIS — R7309 Other abnormal glucose: Secondary | ICD-10-CM

## 2015-06-30 MED ORDER — GLUCOSE BLOOD VI STRP: ORAL_STRIP | Status: DC

## 2015-07-02 ENCOUNTER — Encounter: Payer: Self-pay | Admitting: Obstetrics and Gynecology

## 2015-07-02 ENCOUNTER — Other Ambulatory Visit: Payer: Self-pay

## 2015-07-02 MED ORDER — ERGOCALCIFEROL 1.25 MG (50000 UT) PO CAPS: 50000.0000 [IU] | ORAL_CAPSULE | ORAL | Status: DC

## 2015-07-02 MED ORDER — MAGNESIUM OXIDE 400 MG PO TABS: 400.0000 mg | ORAL_TABLET | Freq: Every day | ORAL | Status: DC

## 2015-07-02 NOTE — Telephone Encounter (Addendum)
Got a fax from [facility] requesting a refill of Vit D 50,000units and Mag-OX 400mg   to be sent in.  Refill request was sent to Dr. Bobetta Lime for approval and submission.

## 2015-07-08 ENCOUNTER — Ambulatory Visit (INDEPENDENT_AMBULATORY_CARE_PROVIDER_SITE_OTHER): Payer: BLUE CROSS/BLUE SHIELD | Admitting: Family Medicine

## 2015-07-08 ENCOUNTER — Encounter: Payer: Self-pay | Admitting: Family Medicine

## 2015-07-08 VITALS — BP 122/86 | HR 96 | Temp 98.5°F | Resp 16 | Wt 192.3 lb

## 2015-07-08 DIAGNOSIS — R238 Other skin changes: Secondary | ICD-10-CM | POA: Diagnosis not present

## 2015-07-08 DIAGNOSIS — R600 Localized edema: Secondary | ICD-10-CM

## 2015-07-08 DIAGNOSIS — I10 Essential (primary) hypertension: Secondary | ICD-10-CM | POA: Diagnosis not present

## 2015-07-08 DIAGNOSIS — E669 Obesity, unspecified: Secondary | ICD-10-CM

## 2015-07-08 DIAGNOSIS — R233 Spontaneous ecchymoses: Secondary | ICD-10-CM

## 2015-07-08 MED ORDER — LIRAGLUTIDE -WEIGHT MANAGEMENT 18 MG/3ML ~~LOC~~ SOPN: 3.0000 mg | PEN_INJECTOR | Freq: Every day | SUBCUTANEOUS | Status: DC

## 2015-07-08 NOTE — Patient Instructions (Signed)
Vitamin C supplement Iron supplement

## 2015-07-08 NOTE — Progress Notes (Signed)
Name: Laura [name]   MRN: [id number]    DOB: [birth year]   Date:07/08/2015       Progress Note  Subjective  Chief Complaint  Chief Complaint  Patient presents with  . Obesity    patient is here to try the new medication  . Bleeding/Bruising    patient stated that she has been bruising a lot and is concerned.    HPI  Laura [name] is a 49 year old female with ongoing issues concerning nonspecific symptoms of cycle body rash, edema, fatigue, joint pain, HTN, pre-diabetes.  She reports today having had high sodium foods over the long weekend which has not helped with her edema. Patient complains of edema. The location of the edema is lower leg(s) bilateral. The edema has been mild, moderate and off and on. Onset of symptoms was several years ago, stable since that time. The edema is present in the evening. The patient states the problem is long-standing. The swelling has been aggravated by dependency of involved area, relieved by diuretics, support stockings, elevation of involved area, and been associated with venous insufficiency. Cardiac risk factors include dyslipidemia, hypertension and obesity (BMI >= 30 kg/m2). Patient is here for evaluation of routine follow up of hypertention associated with propensity for hypokalemia and hypomagnesium. Age at onset of elevated blood pressure: Several years ago. Cardiac symptoms lower extremity edema. Patient denies chest pain, chest pressure/discomfort, claudication, irregular heart beat and palpitations. Cardiovascular risk factors: dyslipidemia, hypertension and obesity (BMI >= 30 kg/m2). Use of agents associated with hypertension: NSAIDS and thyroid hormones. History of target organ damage: none.  Laura [name] is appropriately concerned about her weight. Onset of weight issues started several years ago. Highest recorded weight 225 lbs. Associated symptoms or diseases include pain in weight bearing joints, HTN, HLD, pre-diabetes, skin lesions, depression, poor  self esteem. Current efforts to reduce weight include exercise aerobic and weights 30-45 mins 4x/week.  She is interested in discussing prescription weight loss therapy to aid in her efforts. Other symptoms she complains of is easy bruisability. Just bumping into another object or even sexual intercourse leaves her with bruises on her body. She denies heavy menses or prolonged bleeding or epistaxis.    Patient Active Problem List   Diagnosis Date Noted  . Blood glucose labile 06/30/2015  . Acute gastroenteritis 06/13/2015  . Nausea vomiting and diarrhea 06/13/2015  . 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 Hypokalemia 06/06/2015  . Bilateral lower extremity edema 06/06/2015  . Hot flashes due to surgical menopause 06/06/2015  . Chronic pain of multiple joints 06/06/2015  . Osteoarthrosis, generalized, multiple joints 06/06/2015  . Vaginal enterocele   . Urinary retention with incomplete bladder emptying   . Vaginal dryness, menopausal   . Increased BMI   . Rectocele   . Cystocele   . Anxiety and depression   . Acquired hypothyroidism 12/03/2014  . Anxiety disorder 12/03/2014  . Essential (primary) hypertension 12/03/2014  . Asthma, mild intermittent 12/03/2014  . Avitaminosis D 12/03/2014    Social History  Substance Use Topics  . Smoking status: Never Smoker   . Smokeless tobacco: Not on file  . Alcohol Use: No     Current outpatient prescriptions:  .  ALPRAZolam (XANAX) 1 MG tablet, , Disp: , Rfl: 2 .  amphetamine-dextroamphetamine (ADDERALL) 20 MG tablet, , Disp: , Rfl: 0 .  atorvastatin (LIPITOR) 40 MG tablet, Take 40 mg by mouth daily., Disp: , Rfl:  .  buPROPion (WELLBUTRIN XL) 150 MG 24 hr  tablet, , Disp: , Rfl: 0 .  celecoxib (CELEBREX) 100 MG capsule, Take 1 capsule (100 mg total) by mouth 2 (two) times daily., Disp: 60 capsule, Rfl: 5 .  diclofenac sodium (VOLTAREN) 1 % GEL, Apply topically 2 (two) times daily., Disp: , Rfl:  .  ergocalciferol (VITAMIN D2)  50000 UNITS capsule, Take 1 capsule (50,000 Units total) by mouth once a week., Disp: 12 capsule, Rfl: 0 .  fluconazole (DIFLUCAN) 150 MG tablet, Take one tablet by mouth every 7 days for total 4 doses., Disp: 4 tablet, Rfl: 1 .  furosemide (LASIX) 80 MG tablet, , Disp: , Rfl: 1 .  glucose blood (ACCU-CHEK SMARTVIEW) test strip, Use as instructed to check blood glucose once a day and prn, Disp: 50 each, Rfl: 1 .  KLOR-CON 25 MEQ PACK, TAKE CONTENTS OF ONE PACKET BY MOUTH TWICE DAILY MIXED WITH WATER OR BEVERAGE, Disp: 60 each, Rfl: 3 .  KLOR-CON M20 20 MEQ tablet, TAKE TWO TABLETS BY MOUTH THREE TIMES DAILY, Disp: 180 tablet, Rfl: 5 .  levothyroxine (SYNTHROID, LEVOTHROID) 125 MCG tablet, TAKE ONE TABLET BY MOUTH ONCE DAILY, Disp: 90 tablet, Rfl: 1 .  magnesium gluconate (MAGONATE) 500 MG tablet, Take 500 mg by mouth 3 (three) times daily., Disp: , Rfl:  .  magnesium oxide (MAG-OX) 400 MG tablet, Take 1 tablet (400 mg total) by mouth daily., Disp: 90 tablet, Rfl: 1 .  nystatin-triamcinolone ointment (MYCOLOG), Apply 1 application topically 4 (four) times daily., Disp: 60 g, Rfl: 1 .  ondansetron (ZOFRAN-ODT) 8 MG disintegrating tablet, Take 1 tablet (8 mg total) by mouth every 8 (eight) hours as needed for nausea or vomiting., Disp: 30 tablet, Rfl: 1 .  pantoprazole (PROTONIX) 40 MG tablet, Take 40 mg by mouth daily., Disp: , Rfl:  .  tiZANidine (ZANAFLEX) 4 MG capsule, Take 4 mg by mouth 3 (three) times daily as needed., Disp: , Rfl:  .  valsartan (DIOVAN) 160 MG tablet, Take 160 mg by mouth daily., Disp: , Rfl:   Current facility-administered medications:  .  triamcinolone acetonide (KENALOG-40) injection 40 mg, 40 mg, Intramuscular, Once, Bobetta Lime, MD  No past surgical history on file.  No family history on file.  Allergies  Allergen Reactions  . Meperidine Hives  . Shellfish Allergy Shortness Of Breath and Swelling     Review of Systems  CONSTITUTIONAL: No significant weight  changes, fever, chills, weakness. Chronic fatigue.  HEENT:  - Eyes: No visual changes.  - Ears: No auditory changes. No pain.  - Nose: No sneezing, congestion, runny nose. - Throat: No sore throat. No changes in swallowing. SKIN: Cyclic skin rash. CARDIOVASCULAR: No chest pain, chest pressure or chest discomfort. No palpitations. Yes edema RESPIRATORY: No shortness of breath, cough or sputum.  GASTROINTESTINAL: No anorexia, nausea, vomiting. No changes in bowel habits. No abdominal pain or blood.  GENITOURINARY: No dysuria. No frequency. No discharge.  NEUROLOGICAL: No headache, dizziness, syncope, paralysis, ataxia, numbness or tingling in the extremities. No memory changes. No change in bowel or bladder control.  MUSCULOSKELETAL: Yes joint pain. No muscle pain. HEMATOLOGIC: No anemia, bleeding.  Yes bruising.  LYMPHATICS: No enlarged lymph nodes.  PSYCHIATRIC: No change in mood. No change in sleep pattern.  ENDOCRINOLOGIC: No reports of sweating, cold or heat intolerance. No polyuria or polydipsia.     Objective  BP 122/86 mmHg  Pulse 96  Temp(Src) 98.5 F (36.9 C) (Oral)  Resp 16  Wt 192 lb 4.8 oz (87.227 kg)  SpO2 99% Body mass index is 31.05 kg/(m^2).  Physical Exam  Constitutional: Patient appears overweight and well-nourished. In no distress.  HEENT:  - Head: Normocephalic and atraumatic.  - Ears: Bilateral TMs gray, no erythema or effusion - Nose: Nasal mucosa moist - Mouth/Throat: Oropharynx is clear and moist. No tonsillar hypertrophy or erythema. No post nasal drainage.  - Eyes: Conjunctivae clear, EOM movements normal. PERRLA. No scleral icterus.  Neck: Normal range of motion. Neck supple. No JVD present. No thyromegaly present.  Cardiovascular: Normal rate, regular rhythm and normal heart sounds.  No murmur heard.  Pulmonary/Chest: Effort normal and breath sounds normal. No respiratory distress. Musculoskeletal: Normal range of motion bilateral UE and LE, no  joint effusions. Peripheral vascular: Bilateral LE 1+ pitting edema at ankles. Neurological: CN II-XII grossly intact with no focal deficits. Alert and oriented to person, place, and time. Coordination, balance, strength, speech and gait are normal.  Skin: Skin is warm and dry. Faint bruise left upper extremity. No scattered petechia. Psychiatric: Patient has a normal mood and affect. Behavior is normal in office today. Judgment and thought content normal in office today.   Recent Results (from the past 2160 hour(s))  CBC with Differential/Platelet     Status: None   Collection Time: 06/06/15 10:15 AM  Result Value Ref Range   WBC 7.6 3.4 - 10.8 x10E3/uL   RBC 4.58 3.77 - 5.28 x10E6/uL   Hemoglobin 13.2 11.1 - 15.9 g/dL   Hematocrit 39.2 34.0 - 46.6 %   MCV 86 79 - 97 fL   MCH 28.8 26.6 - 33.0 pg   MCHC 33.7 31.5 - 35.7 g/dL   RDW 14.3 12.3 - 15.4 %   Platelets 365 150 - 379 x10E3/uL   Neutrophils 64 %   Lymphs 26 %   Monocytes 7 %   Eos 2 %   Basos 1 %   Neutrophils Absolute 4.9 1.4 - 7.0 x10E3/uL   Lymphocytes Absolute 2.0 0.7 - 3.1 x10E3/uL   Monocytes Absolute 0.5 0.1 - 0.9 x10E3/uL   EOS (ABSOLUTE) 0.2 0.0 - 0.4 x10E3/uL   Basophils Absolute 0.0 0.0 - 0.2 x10E3/uL   Immature Granulocytes 0 %   Immature Grans (Abs) 0.0 0.0 - 0.1 x10E3/uL  Comprehensive metabolic panel     Status: None   Collection Time: 06/06/15 10:15 AM  Result Value Ref Range   Glucose 91 65 - 99 mg/dL   BUN 15 6 - 24 mg/dL   Creatinine, Ser 0.92 0.57 - 1.00 mg/dL   GFR calc non Af Amer 73 >59 mL/min/1.73   GFR calc Af Amer 85 >59 mL/min/1.73   BUN/Creatinine Ratio 16 9 - 23   Sodium 140 134 - 144 mmol/L   Potassium 4.1 3.5 - 5.2 mmol/L   Chloride 101 97 - 108 mmol/L   CO2 25 18 - 29 mmol/L   Calcium 9.5 8.7 - 10.2 mg/dL   Total Protein 6.4 6.0 - 8.5 g/dL   Albumin 4.0 3.5 - 5.5 g/dL   Globulin, Total 2.4 1.5 - 4.5 g/dL   Albumin/Globulin Ratio 1.7 1.1 - 2.5   Bilirubin Total 0.3 0.0 - 1.2  mg/dL   Alkaline Phosphatase 49 39 - 117 IU/L   AST 13 0 - 40 IU/L   ALT 12 0 - 32 IU/L  Lipid panel     Status: Abnormal   Collection Time: 06/06/15 10:15 AM  Result Value Ref Range   Cholesterol, Total 284 (H) 100 - 199 mg/dL  Triglycerides 185 (H) 0 - 149 mg/dL   HDL 65 >39 mg/dL    Comment: According to ATP-III Guidelines, HDL-C >59 mg/dL is considered a negative risk factor for CHD.    VLDL Cholesterol Cal 37 5 - 40 mg/dL   LDL Calculated 182 (H) 0 - 99 mg/dL   Chol/HDL Ratio 4.4 0.0 - 4.4 ratio units    Comment:                                   T. Chol/HDL Ratio                                             Men  Women                               1/2 Avg.Risk  3.4    3.3                                   Avg.Risk  5.0    4.4                                2X Avg.Risk  9.6    7.1                                3X Avg.Risk 23.4   11.0   TSH     Status: None   Collection Time: 06/06/15 10:15 AM  Result Value Ref Range   TSH 2.750 0.450 - 4.500 uIU/mL  T3, free     Status: None   Collection Time: 06/06/15 10:15 AM  Result Value Ref Range   T3, Free 2.6 2.0 - 4.4 pg/mL  T4, free     Status: None   Collection Time: 06/06/15 10:15 AM  Result Value Ref Range   Free T4 1.41 0.82 - 1.77 ng/dL  Testosterone,Free and Total     Status: None   Collection Time: 06/06/15 10:15 AM  Result Value Ref Range   Testosterone 17 8 - 48 ng/dL   Testosterone, Free 1.3 0.0 - 4.2 pg/mL  Luteinizing hormone     Status: None   Collection Time: 06/06/15 10:15 AM  Result Value Ref Range   LH 7.2 mIU/mL    Comment:                     Follicular phase        2.4 -  12.6                     Ovulation phase        14.0 -  95.6                     Luteal phase            1.0 -  11.4                     Postmenopausal          7.7 -  46.2   Follicle stimulating hormone     Status: None   Collection Time: 06/06/15 10:15 AM  Result Value Ref Range   FSH 5.1 mIU/mL    Comment:                      Follicular phase        3.5 -  12.5                     Ovulation phase         4.7 -  21.5                     Luteal phase            1.7 -   7.7                     Postmenopausal         25.8 - 134.8   Magnesium     Status: None   Collection Time: 06/06/15 10:15 AM  Result Value Ref Range   Magnesium 2.1 1.6 - 2.3 mg/dL  Hemoglobin A1c     Status: Abnormal   Collection Time: 06/06/15 10:15 AM  Result Value Ref Range   Hgb A1c MFr Bld 5.9 (H) 4.8 - 5.6 %    Comment:          Pre-diabetes: 5.7 - 6.4          Diabetes: >6.4          Glycemic control for adults with diabetes: <7.0    Est. average glucose Bld gHb Est-mCnc 123 mg/dL  Prolactin     Status: None   Collection Time: 06/06/15 10:15 AM  Result Value Ref Range   Prolactin 9.6 4.8 - 23.3 ng/mL  DHEA-sulfate     Status: None   Collection Time: 06/06/15 10:15 AM  Result Value Ref Range   DHEA-SO4 113.3 41.2 - 243.7 ug/dL  Antinuclear Antib (ANA)     Status: None   Collection Time: 06/06/15 10:15 AM  Result Value Ref Range   Anit Nuclear Antibody(ANA) Negative Negative  Rheumatoid factor     Status: None   Collection Time: 06/06/15 10:15 AM  Result Value Ref Range   Rhuematoid fact SerPl-aCnc <7.0 0.0 - 13.9 IU/mL  Estradiol     Status: None   Collection Time: 06/06/15 10:15 AM  Result Value Ref Range   Estradiol 326.6 pg/mL    Comment:                     Adult Female:                       Follicular phase   86.3 -   166.0                       Ovulation phase    85.8 -   498.0                       Luteal phase       43.8 -   211.0                       Postmenopausal     <6.0 -    54.7  Pregnancy                       1st trimester     215.0 - >4300.0                     Girls (1-10 years)    6.0 -    27.0 Roche ECLIA methodology      Assessment & Plan  1. Bilateral lower extremity edema Worse than previous due to recent high sodium diet. Elevate legs, balance fluid intake, continue  diuretic and weight loss.   2. Easy bruisability Will check common clotting and vitamin deficiencies.  - PTT - INR/PT - B12 and Folate Panel - Ferritin - Iron - Iron and TIBC  3. Obesity, Class I, BMI 30.0-34.9 (see actual BMI) Co morbidities include HTN, HLD, pre-diabetes, edema in lower extremities. Decided on Saxenda trial, sample kit provided to initiate. The patient has been counseled on the proper use, side effects and potential interactions of the new medication. Patient encouraged to review the side effects and safety profile pamphlet provided with the prescription from the pharmacy as well as request counseling from the pharmacy team as needed.   - Liraglutide -Weight Management (SAXENDA) 18 MG/3ML SOPN; Inject 3 mg into the skin daily.  Dispense: 12 mL; Refill: 3  4. Hypertension goal BP (blood pressure) < 140/90 Clinically stable findings based on clinical exam and on review of any pertinent results. Recommended to patient that they continue their current regimen with regular follow ups.

## 2015-07-09 ENCOUNTER — Other Ambulatory Visit: Payer: Self-pay | Admitting: Family Medicine

## 2015-07-09 LAB — IRON AND TIBC
IRON SATURATION: 15 % (ref 15–55)
IRON: 60 ug/dL (ref 27–159)
Total Iron Binding Capacity: 389 ug/dL (ref 250–450)
UIBC: 329 ug/dL (ref 131–425)

## 2015-07-09 LAB — PROTIME-INR
INR: 0.9 (ref 0.8–1.2)
Prothrombin Time: 9.6 s (ref 9.1–12.0)

## 2015-07-09 LAB — B12 AND FOLATE PANEL: Vitamin B-12: 486 pg/mL (ref 211–946)

## 2015-07-09 LAB — APTT: APTT: 24 s (ref 24–33)

## 2015-07-09 LAB — FERRITIN: Ferritin: 85 ng/mL (ref 15–150)

## 2015-07-09 MED ORDER — INSULIN PEN NEEDLE 32G X 6 MM MISC: 1.0000 | Freq: Every day | Status: DC

## 2015-07-10 ENCOUNTER — Encounter: Payer: Self-pay | Admitting: Family Medicine

## 2015-07-13 ENCOUNTER — Other Ambulatory Visit: Payer: Self-pay | Admitting: Family Medicine

## 2015-07-14 ENCOUNTER — Encounter: Payer: Self-pay | Admitting: Family Medicine

## 2015-07-14 ENCOUNTER — Other Ambulatory Visit: Payer: Self-pay | Admitting: Family Medicine

## 2015-07-14 DIAGNOSIS — K219 Gastro-esophageal reflux disease without esophagitis: Secondary | ICD-10-CM

## 2015-07-14 MED ORDER — PANTOPRAZOLE SODIUM 40 MG PO TBEC: 40.0000 mg | DELAYED_RELEASE_TABLET | Freq: Every day | ORAL | Status: DC

## 2015-07-15 ENCOUNTER — Telehealth: Payer: Self-pay

## 2015-07-15 NOTE — Telephone Encounter (Signed)
This patient called stating a authorization was needed for her Saxenda. I informed her that we have not received any prior authorizations.  Spoke to [employer] and she stated they have tried to send it and it has not gone through so she was going to manually send it to our office.   I then left a message for this patient to inform her that as soon as we get that paper our referral coordinator will get started on it.

## 2015-08-06 ENCOUNTER — Other Ambulatory Visit: Payer: Self-pay | Admitting: Family Medicine

## 2015-08-06 ENCOUNTER — Encounter: Payer: Self-pay | Admitting: Family Medicine

## 2015-08-07 ENCOUNTER — Telehealth: Payer: Self-pay | Admitting: Family Medicine

## 2015-08-07 MED ORDER — VALSARTAN 160 MG PO TABS: 160.0000 mg | ORAL_TABLET | Freq: Every day | ORAL | Status: DC

## 2015-08-07 NOTE — Telephone Encounter (Signed)
Sent pt medication to requested pharmacy.

## 2015-08-07 NOTE — Telephone Encounter (Signed)
Pt called again and didn't realize she was out of her medication. She is asking if someone can call this in for her.

## 2015-08-07 NOTE — Telephone Encounter (Signed)
Pt has been out of her BP meds for3 days. Needs refill . Pharm is [facility] on [address]

## 2015-08-07 NOTE — Telephone Encounter (Signed)
LMOM to inform pt °

## 2015-08-09 ENCOUNTER — Other Ambulatory Visit: Payer: Self-pay | Admitting: Family Medicine

## 2015-08-11 ENCOUNTER — Other Ambulatory Visit: Payer: Self-pay | Admitting: Family Medicine

## 2015-08-11 MED ORDER — VALSARTAN 160 MG PO TABS: 160.0000 mg | ORAL_TABLET | Freq: Every day | ORAL | Status: DC

## 2015-08-27 ENCOUNTER — Telehealth: Payer: Self-pay | Admitting: Family Medicine

## 2015-08-27 ENCOUNTER — Encounter: Payer: Self-pay | Admitting: Family Medicine

## 2015-08-27 NOTE — Telephone Encounter (Signed)
Patient is coming in for her flu, pnuemonia shot on this coming Friday. She would like to know if you advise her to get the shingle shot as well. Please return call [phone]

## 2015-08-27 NOTE — Telephone Encounter (Signed)
Shingle shot not approved for her age.

## 2015-08-28 NOTE — Telephone Encounter (Signed)
Informed of the shingle shot not being covered by her insurance. Patient was okay with that.

## 2015-08-29 ENCOUNTER — Ambulatory Visit (INDEPENDENT_AMBULATORY_CARE_PROVIDER_SITE_OTHER): Payer: BLUE CROSS/BLUE SHIELD

## 2015-08-29 DIAGNOSIS — Z23 Encounter for immunization: Secondary | ICD-10-CM

## 2015-09-01 ENCOUNTER — Other Ambulatory Visit: Payer: Self-pay

## 2015-09-01 DIAGNOSIS — L298 Other pruritus: Secondary | ICD-10-CM

## 2015-09-01 MED ORDER — FLUCONAZOLE 150 MG PO TABS: 150.0000 mg | ORAL_TABLET | Freq: Once | ORAL | Status: DC

## 2015-09-01 NOTE — Telephone Encounter (Signed)
When patient came in for her influenza vaccine on 08/29/15, she stated that she had a yeast infection and needed a refill of her Diflucan, patient was told that I would discuss this with Dr. Nadine Counts, but that she might need an office visit.

## 2015-09-18 ENCOUNTER — Telehealth: Payer: Self-pay | Admitting: Family Medicine

## 2015-09-18 MED ORDER — POTASSIUM BICARBONATE 25 MEQ PO TBEF: 25.0000 meq | EFFERVESCENT_TABLET | Freq: Two times a day (BID) | ORAL | Status: DC

## 2015-09-18 NOTE — Telephone Encounter (Signed)
Jan from [facility] called on behalf of pt about one of prescriptions. Please return her call @ [phone]

## 2015-09-18 NOTE — Telephone Encounter (Signed)
Contacted Jan at [facility] to see how we can be of assistance to her. Dr. Nadine Counts gave a verbal to change this patient's Klor Con since the packet will be discontinued. The new rx was submitted via phone.

## 2015-10-01 ENCOUNTER — Other Ambulatory Visit: Payer: Self-pay | Admitting: Family Medicine

## 2015-10-20 ENCOUNTER — Encounter: Payer: Self-pay | Admitting: Family Medicine

## 2015-11-09 ENCOUNTER — Other Ambulatory Visit: Payer: Self-pay | Admitting: Family Medicine

## 2015-11-12 ENCOUNTER — Telehealth: Payer: Self-pay

## 2015-11-12 NOTE — Telephone Encounter (Signed)
Laura [name] got a request for a prior auth on her saxenda.  Patient informed that could not be approved until she was seen for a f/u visit to get weight in order to get med approved.

## 2015-11-19 ENCOUNTER — Encounter: Payer: Self-pay | Admitting: Family Medicine

## 2015-11-19 ENCOUNTER — Ambulatory Visit (INDEPENDENT_AMBULATORY_CARE_PROVIDER_SITE_OTHER): Payer: BLUE CROSS/BLUE SHIELD | Admitting: Family Medicine

## 2015-11-19 VITALS — BP 120/84 | HR 94 | Temp 97.8°F | Resp 14 | Ht 66.0 in | Wt 186.1 lb

## 2015-11-19 DIAGNOSIS — E039 Hypothyroidism, unspecified: Secondary | ICD-10-CM | POA: Diagnosis not present

## 2015-11-19 DIAGNOSIS — E785 Hyperlipidemia, unspecified: Secondary | ICD-10-CM | POA: Diagnosis not present

## 2015-11-19 DIAGNOSIS — R7309 Other abnormal glucose: Secondary | ICD-10-CM

## 2015-11-19 DIAGNOSIS — N9489 Other specified conditions associated with female genital organs and menstrual cycle: Secondary | ICD-10-CM

## 2015-11-19 DIAGNOSIS — K219 Gastro-esophageal reflux disease without esophagitis: Secondary | ICD-10-CM | POA: Diagnosis not present

## 2015-11-19 DIAGNOSIS — E669 Obesity, unspecified: Secondary | ICD-10-CM | POA: Diagnosis not present

## 2015-11-19 DIAGNOSIS — E782 Mixed hyperlipidemia: Secondary | ICD-10-CM | POA: Insufficient documentation

## 2015-11-19 DIAGNOSIS — E559 Vitamin D deficiency, unspecified: Secondary | ICD-10-CM

## 2015-11-19 DIAGNOSIS — Z3009 Encounter for other general counseling and advice on contraception: Secondary | ICD-10-CM

## 2015-11-19 DIAGNOSIS — R21 Rash and other nonspecific skin eruption: Secondary | ICD-10-CM

## 2015-11-19 DIAGNOSIS — Z309 Encounter for contraceptive management, unspecified: Secondary | ICD-10-CM

## 2015-11-19 DIAGNOSIS — N898 Other specified noninflammatory disorders of vagina: Secondary | ICD-10-CM

## 2015-11-19 DIAGNOSIS — I10 Essential (primary) hypertension: Secondary | ICD-10-CM

## 2015-11-19 MED ORDER — LIRAGLUTIDE -WEIGHT MANAGEMENT 18 MG/3ML ~~LOC~~ SOPN: 3.0000 mg | PEN_INJECTOR | Freq: Every day | SUBCUTANEOUS | Status: DC

## 2015-11-19 MED ORDER — HYDROXYZINE HCL 10 MG PO TABS: 10.0000 mg | ORAL_TABLET | Freq: Three times a day (TID) | ORAL | Status: DC | PRN

## 2015-11-19 MED ORDER — MICONAZOLE NITRATE 4 % VA CREA: 1.0000 "application " | TOPICAL_CREAM | Freq: Every day | VAGINAL | Status: DC | PRN

## 2015-11-19 MED ORDER — PANTOPRAZOLE SODIUM 40 MG PO TBEC: 40.0000 mg | DELAYED_RELEASE_TABLET | Freq: Every day | ORAL | Status: DC

## 2015-11-19 MED ORDER — NORGESTIMATE-ETH ESTRADIOL 0.25-35 MG-MCG PO TABS: 1.0000 | ORAL_TABLET | Freq: Every day | ORAL | Status: DC

## 2015-11-19 NOTE — Progress Notes (Signed)
Name: Laura [name]   MRN: [id number]    DOB: 04-Jul-1966   Date:11/19/2015       Progress Note  Subjective  Chief Complaint  Chief Complaint  Patient presents with  . Medication Refill  . Hypertension  . Hyperlipidemia    Stoped med due to muscle pain  . Hypothyroidism  . Gastroesophageal Reflux  . Obesity    weight loss     HPI  Laura [name] is a 50 year old female with ongoing issues concerning nonspecific symptoms of cycle body rash, edema, fatigue, joint pain, HTN, pre-diabetes, obesity. She reports today that her chronic issues have been stable.   Previously she complained of edema. The location of the edema is lower leg(s) bilateral. The edema has been mild to moderate and off and on. Onset of symptoms was several years ago, stable since that time with use of diuretic. The edema is present in the evening usually. The patient states the problem is long-standing. Strong family history of edema if females on mother's side. The swelling has been aggravated by dependency of involved area, relieved by diuretics, support stockings, elevation of involved area, and been associated with venous insufficiency. Cardiac risk factors include dyslipidemia, hypertension and obesity (BMI >= 30 kg/m2).   She self discontinued statin medication due to muscle pain.   Patient is here for evaluation of routine follow up of hypertention associated with propensity for hypokalemia and hypomagnesium. Age at onset of elevated blood pressure: Several years ago. Cardiac symptoms lower extremity edema. Patient denies chest pain, chest pressure/discomfort, claudication, irregular heart beat and palpitations. Cardiovascular risk factors: dyslipidemia, hypertension and obesity (BMI >= 30 kg/m2). Use of agents associated with hypertension: NSAIDS and thyroid hormones. History of target organ damage: none.   Laura [name] is appropriately concerned about her weight. Onset of weight issues started several years ago.  Highest recorded weight 225 lbs. Associated symptoms or diseases include pain in weight bearing joints, HTN, HLD, pre-diabetes, skin lesions, depression, poor self esteem. Current efforts to reduce weight include exercise aerobic and weights 30-45 mins 4x/week. She is also prescribed Saxenda to help with weight loss.   Patient still has a rash that emerges on her body every month assoicated with menses. 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   In addition Laura [name] requests a prescription for miconazole to use external vaginal area as suggested by her gynecologist for chronic vaginal discharge after intercourse not necessarily bacterial vaginosis.   Past Medical History  Diagnosis Date  . Vaginal enterocele   . High cholesterol   . GERD (gastroesophageal reflux disease)   . Exposure to hepatitis C   . Bacterial vaginitis   . Yeast infection   . Kidney disease   . Urinary retention with incomplete bladder emptying   . Vaginal dryness, menopausal   . Increased BMI   . Hypokalemia   . Rectocele   . Anxiety and depression   . Cystocele   . Elevated fasting blood sugar     Patient Active Problem List   Diagnosis Date Noted  . Easy bruisability 07/08/2015  . Blood glucose labile 06/30/2015  . GERD without esophagitis 06/13/2015  . Nausea vomiting and diarrhea 06/13/2015  . 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 Hypokalemia 06/06/2015  . Bilateral lower extremity edema 06/06/2015  . Hot flashes due to surgical menopause 06/06/2015  . Chronic  pain of multiple joints 06/06/2015  . Osteoarthrosis, generalized, multiple joints 06/06/2015  . Vaginal enterocele   . Urinary retention with incomplete bladder emptying   . Vaginal dryness, menopausal   . Obesity, Class I, BMI 30.0-34.9 (see actual BMI)   . Rectocele   .  Cystocele   . Anxiety and depression   . Acquired hypothyroidism 12/03/2014  . Anxiety disorder 12/03/2014  . Hypertension goal BP (blood pressure) < 140/90 12/03/2014  . Asthma, mild intermittent 12/03/2014  . Avitaminosis D 12/03/2014    Social History  Substance Use Topics  . Smoking status: Never Smoker   . Smokeless tobacco: Not on file  . Alcohol Use: No     Current outpatient prescriptions:  .  ALPRAZolam (XANAX) 1 MG tablet, , Disp: , Rfl: 2 .  amphetamine-dextroamphetamine (ADDERALL) 20 MG tablet, , Disp: , Rfl: 0 .  buPROPion (WELLBUTRIN XL) 150 MG 24 hr tablet, , Disp: , Rfl: 0 .  celecoxib (CELEBREX) 100 MG capsule, Take 1 capsule (100 mg total) by mouth 2 (two) times daily., Disp: 60 capsule, Rfl: 5 .  diclofenac sodium (VOLTAREN) 1 % GEL, Apply topically 2 (two) times daily., Disp: , Rfl:  .  fluconazole (DIFLUCAN) 150 MG tablet, Take 1 tablet (150 mg total) by mouth once., Disp: 1 tablet, Rfl: 0 .  furosemide (LASIX) 80 MG tablet, TAKE ONE TABLET BY MOUTH TWICE DAILY, Disp: 180 tablet, Rfl: 2 .  glucose blood (ACCU-CHEK SMARTVIEW) test strip, Use as instructed to check blood glucose once a day and prn, Disp: 50 each, Rfl: 1 .  Insulin Pen Needle 32G X 6 MM MISC, 1 Device by Does not apply route daily., Disp: 50 each, Rfl: 1 .  levothyroxine (SYNTHROID, LEVOTHROID) 125 MCG tablet, TAKE ONE TABLET BY MOUTH ONCE DAILY, Disp: 90 tablet, Rfl: 1 .  Liraglutide -Weight Management (SAXENDA) 18 MG/3ML SOPN, Inject 3 mg into the skin daily., Disp: 12 mL, Rfl: 3 .  magnesium gluconate (MAGONATE) 500 MG tablet, Take 500 mg by mouth 3 (three) times daily., Disp: , Rfl:  .  magnesium oxide (MAG-OX) 400 MG tablet, Take 1 tablet (400 mg total) by mouth daily., Disp: 90 tablet, Rfl: 1 .  metFORMIN (GLUCOPHAGE) 500 MG tablet, TAKE ONE TABLET BY MOUTH ONCE DAILY, Disp: 90 tablet, Rfl: 2 .  nystatin-triamcinolone ointment (MYCOLOG), Apply 1 application topically 4 (four) times daily.,  Disp: 60 g, Rfl: 1 .  pantoprazole (PROTONIX) 40 MG tablet, Take 1 tablet (40 mg total) by mouth daily., Disp: 30 tablet, Rfl: 5 .  potassium bicarbonate (KLOR-CON/EF) 25 MEQ disintegrating tablet, Take 1 tablet (25 mEq total) by mouth 2 (two) times daily., Disp: 120 tablet, Rfl: 5 .  tiZANidine (ZANAFLEX) 4 MG capsule, Take 4 mg by mouth 3 (three) times daily as needed., Disp: , Rfl:  .  valsartan (DIOVAN) 160 MG tablet, Take 1 tablet (160 mg total) by mouth daily., Disp: 90 tablet, Rfl: 3 .  Vitamin D, Ergocalciferol, (DRISDOL) 50000 UNITS CAPS capsule, TAKE ONE CAPSULE BY MOUTH ONCE A WEEK, Disp: 12 capsule, Rfl: 0  Current facility-administered medications:  .  triamcinolone acetonide (KENALOG-40) injection 40 mg, 40 mg, Intramuscular, Once, Bobetta Lime, MD  No past surgical history on file.  No family history on file.  Allergies  Allergen Reactions  . Meperidine Hives  . Shellfish Allergy Shortness Of Breath and Swelling     Review of Systems  CONSTITUTIONAL: Yes intended weight loss. No fever, chills, weakness or fatigue.  SKIN: Cycle generalized rash currently not present.  CARDIOVASCULAR: No chest pain, chest pressure or chest discomfort. No palpitations or worsening edema.  RESPIRATORY: No shortness of breath, cough or sputum.  GASTROINTESTINAL: No anorexia, nausea, vomiting. No changes in bowel habits. No abdominal pain or blood.  NEUROLOGICAL: No headache, dizziness, syncope, paralysis, ataxia, numbness or tingling in the extremities. No memory changes. No change in bowel or bladder control.  MUSCULOSKELETAL: Occasional joint pain. No muscle pain. HEMATOLOGIC: No anemia, bleeding or bruising.  LYMPHATICS: No enlarged lymph nodes.  PSYCHIATRIC: No change in mood. No change in sleep pattern.  ENDOCRINOLOGIC: No reports of sweating, cold or heat intolerance. No polyuria or polydipsia.     Objective  BP 120/84 mmHg  Pulse 94  Temp(Src) 97.8 F (36.6 C) (Oral)   Resp 14  Ht 5\' 6"  (1.676 m)  Wt 186 lb 1.6 oz (84.414 kg)  BMI 30.05 kg/m2  SpO2 96% Body mass index is 30.05 kg/(m^2).  Physical Exam  Constitutional: Patient is overweight (overall weight loss) and well-nourished. In no distress.  Cardiovascular: Normal rate, regular rhythm and normal heart sounds.  No murmur heard.  Pulmonary/Chest: Effort normal and breath sounds normal. No respiratory distress. Musculoskeletal: Normal range of motion bilateral UE and LE, no joint effusions. Peripheral vascular: Bilateral LE trace pedal edema. Neurological: CN II-XII grossly intact with no focal deficits. Alert and oriented to person, place, and time. Coordination, balance, strength, speech and gait are normal.  Skin: Skin is warm and dry. No rash noted. No erythema.  Psychiatric: Patient has a normal mood and affect. Behavior is normal in office today. Judgment and thought content normal in office today.  Assessment & Plan  1. Hypertension goal BP (blood pressure) < 140/90 Well controled.  - CBC with Differential/Platelet - Comprehensive metabolic panel  2. GERD without esophagitis Refilled.  - pantoprazole (PROTONIX) 40 MG tablet; Take 1 tablet (40 mg total) by mouth daily.  Dispense: 90 tablet; Refill: 2  3. Obesity, Class I, BMI 30.0-34.9 (see actual BMI) Continues to lose weight on diet and exercise with Saxenda use which is also helping with her propensity for fluid retention.  - Liraglutide -Weight Management (SAXENDA) 18 MG/3ML SOPN; Inject 3 mg into the skin daily.  Dispense: 12 mL; Refill: 2  4. Encounter for general counseling on prescription of oral contraceptives Patient has been counseled on birth control options today. We discussed risks and benefits of each available contraception. Counseled on risk for STDs not reduced by birth control use. Decided on oral contraceptives. The patient has been counseled on the proper usage, side effects and potential interactions of the new  medication.  - norgestimate-ethinyl estradiol (ORTHO-CYCLEN,SPRINTEC,PREVIFEM) 0.25-35 MG-MCG tablet; Take 1 tablet by mouth daily.  Dispense: 1 Package; Refill: 11  5. Blood glucose labile Continues to take metformin 500 mg a day.  - Hemoglobin A1c  6. 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 Avitaminosis D  - VITAMIN D 25 Hydroxy (Vit-D Deficiency, Fractures)  8. Acquired hypothyroidism  - TSH - T3, free - T4, free  9. Hyperlipidemia LDL goal <100  - Lipid panel  10. Vaginal odor Refilled  - MICONAZOLE NITRATE VAGINAL 4 % CREA; Place 1 application vaginally daily as needed.  Dispense: 25 g; Refill: 3

## 2015-11-20 ENCOUNTER — Other Ambulatory Visit: Payer: Self-pay

## 2015-11-20 ENCOUNTER — Telehealth: Payer: Self-pay

## 2015-11-20 DIAGNOSIS — R21 Rash and other nonspecific skin eruption: Secondary | ICD-10-CM

## 2015-11-20 MED ORDER — HYDROXYZINE HCL 10 MG PO TABS: 10.0000 mg | ORAL_TABLET | Freq: Three times a day (TID) | ORAL | Status: DC | PRN

## 2015-11-20 NOTE — Telephone Encounter (Signed)
Pharmacy called states patient has been taking Vistaril 25mg , is this a dosage decrease?

## 2015-11-21 ENCOUNTER — Other Ambulatory Visit: Payer: Self-pay | Admitting: Family Medicine

## 2015-11-21 ENCOUNTER — Encounter: Payer: Self-pay | Admitting: Family Medicine

## 2015-11-21 DIAGNOSIS — N898 Other specified noninflammatory disorders of vagina: Secondary | ICD-10-CM

## 2015-11-21 MED ORDER — HYDROXYZINE HCL 25 MG PO TABS: 25.0000 mg | ORAL_TABLET | Freq: Three times a day (TID) | ORAL | Status: DC | PRN

## 2015-11-21 MED ORDER — METRONIDAZOLE 0.75 % VA GEL: 1.0000 | Freq: Every day | VAGINAL | Status: DC | PRN

## 2015-11-21 NOTE — Telephone Encounter (Signed)
Changed Rx from 10mg  to 25 mg and sent to pharmacy again.

## 2015-11-22 ENCOUNTER — Encounter: Payer: Self-pay | Admitting: Family Medicine

## 2015-11-24 ENCOUNTER — Encounter: Payer: Self-pay | Admitting: Family Medicine

## 2015-11-25 LAB — CBC WITH DIFFERENTIAL/PLATELET
BASOS: 1 %
Basophils Absolute: 0 10*3/uL (ref 0.0–0.2)
EOS (ABSOLUTE): 0.5 10*3/uL — AB (ref 0.0–0.4)
EOS: 7 %
HEMATOCRIT: 38.1 % (ref 34.0–46.6)
Hemoglobin: 12.5 g/dL (ref 11.1–15.9)
IMMATURE GRANULOCYTES: 0 %
Immature Grans (Abs): 0 10*3/uL (ref 0.0–0.1)
LYMPHS ABS: 2.9 10*3/uL (ref 0.7–3.1)
Lymphs: 40 %
MCH: 28.5 pg (ref 26.6–33.0)
MCHC: 32.8 g/dL (ref 31.5–35.7)
MCV: 87 fL (ref 79–97)
MONOS ABS: 0.6 10*3/uL (ref 0.1–0.9)
Monocytes: 8 %
NEUTROS ABS: 3.3 10*3/uL (ref 1.4–7.0)
NEUTROS PCT: 44 %
PLATELETS: 393 10*3/uL — AB (ref 150–379)
RBC: 4.39 x10E6/uL (ref 3.77–5.28)
RDW: 13.7 % (ref 12.3–15.4)
WBC: 7.3 10*3/uL (ref 3.4–10.8)

## 2015-11-25 LAB — LIPID PANEL
CHOL/HDL RATIO: 4.8 ratio — AB (ref 0.0–4.4)
CHOLESTEROL TOTAL: 238 mg/dL — AB (ref 100–199)
HDL: 50 mg/dL (ref >39)
LDL Calculated: 124 mg/dL — ABNORMAL HIGH (ref 0–99)
TRIGLYCERIDES: 319 mg/dL — AB (ref 0–149)
VLDL Cholesterol Cal: 64 mg/dL — ABNORMAL HIGH (ref 5–40)

## 2015-11-25 LAB — COMPREHENSIVE METABOLIC PANEL
A/G RATIO: 1.6 (ref 1.1–2.5)
ALT: 9 IU/L (ref 0–32)
AST: 11 IU/L (ref 0–40)
Albumin: 3.7 g/dL (ref 3.5–5.5)
Alkaline Phosphatase: 61 IU/L (ref 39–117)
BUN / CREAT RATIO: 20 (ref 9–23)
BUN: 18 mg/dL (ref 6–24)
CALCIUM: 9.2 mg/dL (ref 8.7–10.2)
CO2: 26 mmol/L (ref 18–29)
CREATININE: 0.88 mg/dL (ref 0.57–1.00)
Chloride: 101 mmol/L (ref 96–106)
GFR calc Af Amer: 89 mL/min/{1.73_m2} (ref >59)
GFR calc non Af Amer: 77 mL/min/{1.73_m2} (ref >59)
GLOBULIN, TOTAL: 2.3 g/dL (ref 1.5–4.5)
Glucose: 97 mg/dL (ref 65–99)
Potassium: 4.3 mmol/L (ref 3.5–5.2)
Sodium: 141 mmol/L (ref 134–144)
Total Protein: 6 g/dL (ref 6.0–8.5)

## 2015-11-25 LAB — VITAMIN D 25 HYDROXY (VIT D DEFICIENCY, FRACTURES): VIT D 25 HYDROXY: 37.4 ng/mL (ref 30.0–100.0)

## 2015-11-25 LAB — T4, FREE: Free T4: 1.12 ng/dL (ref 0.82–1.77)

## 2015-11-25 LAB — TSH: TSH: 4.84 u[IU]/mL — ABNORMAL HIGH (ref 0.450–4.500)

## 2015-11-25 LAB — HEMOGLOBIN A1C
ESTIMATED AVERAGE GLUCOSE: 114 mg/dL
HEMOGLOBIN A1C: 5.6 % (ref 4.8–5.6)

## 2015-11-25 LAB — T3, FREE: T3, Free: 2.2 pg/mL (ref 2.0–4.4)

## 2015-12-14 ENCOUNTER — Other Ambulatory Visit: Payer: Self-pay | Admitting: Family Medicine

## 2015-12-16 ENCOUNTER — Ambulatory Visit (INDEPENDENT_AMBULATORY_CARE_PROVIDER_SITE_OTHER): Payer: BLUE CROSS/BLUE SHIELD | Admitting: Family Medicine

## 2015-12-16 ENCOUNTER — Encounter: Payer: Self-pay | Admitting: Family Medicine

## 2015-12-16 VITALS — BP 122/80 | HR 104 | Temp 98.1°F | Resp 16 | Ht 65.0 in | Wt 190.2 lb

## 2015-12-16 DIAGNOSIS — R0602 Shortness of breath: Secondary | ICD-10-CM | POA: Diagnosis not present

## 2015-12-16 DIAGNOSIS — R002 Palpitations: Secondary | ICD-10-CM

## 2015-12-16 DIAGNOSIS — F329 Major depressive disorder, single episode, unspecified: Secondary | ICD-10-CM

## 2015-12-16 DIAGNOSIS — F419 Anxiety disorder, unspecified: Secondary | ICD-10-CM

## 2015-12-16 DIAGNOSIS — E876 Hypokalemia: Secondary | ICD-10-CM | POA: Diagnosis not present

## 2015-12-16 DIAGNOSIS — B379 Candidiasis, unspecified: Secondary | ICD-10-CM | POA: Insufficient documentation

## 2015-12-16 DIAGNOSIS — F418 Other specified anxiety disorders: Secondary | ICD-10-CM | POA: Diagnosis not present

## 2015-12-16 DIAGNOSIS — J0101 Acute recurrent maxillary sinusitis: Secondary | ICD-10-CM

## 2015-12-16 DIAGNOSIS — T3695XA Adverse effect of unspecified systemic antibiotic, initial encounter: Secondary | ICD-10-CM

## 2015-12-16 DIAGNOSIS — R011 Cardiac murmur, unspecified: Secondary | ICD-10-CM | POA: Diagnosis not present

## 2015-12-16 DIAGNOSIS — F32A Depression, unspecified: Secondary | ICD-10-CM

## 2015-12-16 DIAGNOSIS — J320 Chronic maxillary sinusitis: Secondary | ICD-10-CM | POA: Insufficient documentation

## 2015-12-16 DIAGNOSIS — L298 Other pruritus: Secondary | ICD-10-CM | POA: Diagnosis not present

## 2015-12-16 MED ORDER — AMOXICILLIN-POT CLAVULANATE 875-125 MG PO TABS: 1.0000 | ORAL_TABLET | Freq: Two times a day (BID) | ORAL | Status: DC

## 2015-12-16 MED ORDER — FLUCONAZOLE 150 MG PO TABS: 150.0000 mg | ORAL_TABLET | Freq: Once | ORAL | Status: DC

## 2015-12-16 NOTE — Progress Notes (Signed)
Name: Laura [name]   MRN: [id number]    DOB: [birth year]   Date:12/16/2015       Progress Note  Subjective  Chief Complaint  Chief Complaint  Patient presents with  . Palpitations    onset 2 weeks with elevated heart rate,sob and chest pain.  Thinks it may be due to low potassium  . Sinusitis    HPI  Laura [name] is a 50 year old female with ongoing issues concerning nonspecific symptoms of cycle body rash, edema, fatigue, joint pain, HTN, pre-diabetes, obesity. She reports today that since stopping Saxenda (due to mental status change) she is gaining weight, having more on/off swelling, SOB, intermittent palpitations.  She complains of chronic edema. The location of the edema is lower leg(s) bilateral. The edema has been mild to moderate and off and on. Onset of symptoms was several years ago, stable since that time with use of diuretic. The edema is present in the evening usually. The patient states the problem is long-standing. Strong family history of edema if females on mother's side. The swelling has been aggravated by dependency of involved area, relieved by diuretics, support stockings, elevation of involved area, and been associated with venous insufficiency. Cardiac risk factors include dyslipidemia, hypertension and obesity (BMI >= 30 kg/m2).    Today she also reports symptoms of intermittent palpitations, SOB, chest pressure up in her throat area onset 2 weeks ago. No arm numbness, no dizziness, no jaw pain or abdominal pain. Not sure if it is related to anxiety as she does have a lot going on in her life.   Patient is also here today with concerns regarding the following symptoms congestion, sneezing, ear pressure, sinus pressure and achiness that started weeks ago.  Associated with fatigue. Not associated with fever. Has tried the following home remedies: called tele doc and was given antibiotic amoxill. For 10 days 2 weeks ago with no relief.   Past Medical History   Diagnosis Date  . Vaginal enterocele   . High cholesterol   . GERD (gastroesophageal reflux disease)   . Exposure to hepatitis C   . Bacterial vaginitis   . Yeast infection   . Kidney disease   . Urinary retention with incomplete bladder emptying   . Vaginal dryness, menopausal   . Increased BMI   . Hypokalemia   . Rectocele   . Anxiety and depression   . Cystocele   . Elevated fasting blood sugar     Social History  Substance Use Topics  . Smoking status: Never Smoker   . Smokeless tobacco: Not on file  . Alcohol Use: No     Current outpatient prescriptions:  .  ALPRAZolam (XANAX) 1 MG tablet, , Disp: , Rfl: 2 .  amphetamine-dextroamphetamine (ADDERALL) 20 MG tablet, , Disp: , Rfl: 0 .  buPROPion (WELLBUTRIN XL) 150 MG 24 hr tablet, , Disp: , Rfl: 0 .  celecoxib (CELEBREX) 100 MG capsule, Take 1 capsule (100 mg total) by mouth 2 (two) times daily., Disp: 60 capsule, Rfl: 5 .  diclofenac sodium (VOLTAREN) 1 % GEL, Apply topically 2 (two) times daily., Disp: , Rfl:  .  fluconazole (DIFLUCAN) 150 MG tablet, Take 1 tablet (150 mg total) by mouth once., Disp: 1 tablet, Rfl: 0 .  furosemide (LASIX) 80 MG tablet, TAKE ONE TABLET BY MOUTH TWICE DAILY, Disp: 180 tablet, Rfl: 2 .  glucose blood (ACCU-CHEK SMARTVIEW) test strip, Use as instructed to check blood glucose once a day and prn, Disp:  50 each, Rfl: 1 .  hydrOXYzine (ATARAX/VISTARIL) 25 MG tablet, Take 1 tablet (25 mg total) by mouth 3 (three) times daily as needed., Disp: 60 tablet, Rfl: 2 .  Insulin Pen Needle 32G X 6 MM MISC, 1 Device by Does not apply route daily., Disp: 50 each, Rfl: 1 .  levothyroxine (SYNTHROID, LEVOTHROID) 125 MCG tablet, TAKE ONE TABLET BY MOUTH ONCE DAILY, Disp: 90 tablet, Rfl: 1 .  magnesium gluconate (MAGONATE) 500 MG tablet, Take 500 mg by mouth 3 (three) times daily., Disp: , Rfl:  .  magnesium oxide (MAG-OX) 400 MG tablet, TAKE ONE TABLET BY MOUTH ONCE DAILY, Disp: 90 tablet, Rfl: 0 .   metFORMIN (GLUCOPHAGE) 500 MG tablet, TAKE ONE TABLET BY MOUTH ONCE DAILY, Disp: 90 tablet, Rfl: 2 .  metroNIDAZOLE (METROGEL) 0.75 % vaginal gel, Place 1 Applicatorful vaginally daily as needed. Patient name previously [employer], Disp: 70 g, Rfl: 2 .  MICONAZOLE NITRATE VAGINAL 4 % CREA, Place 1 application vaginally daily as needed., Disp: 25 g, Rfl: 3 .  norgestimate-ethinyl estradiol (ORTHO-CYCLEN,SPRINTEC,PREVIFEM) 0.25-35 MG-MCG tablet, Take 1 tablet by mouth daily., Disp: 1 Package, Rfl: 11 .  nystatin-triamcinolone ointment (MYCOLOG), Apply 1 application topically 4 (four) times daily., Disp: 60 g, Rfl: 1 .  pantoprazole (PROTONIX) 40 MG tablet, Take 1 tablet (40 mg total) by mouth daily., Disp: 90 tablet, Rfl: 2 .  potassium bicarbonate (KLOR-CON/EF) 25 MEQ disintegrating tablet, Take 1 tablet (25 mEq total) by mouth 2 (two) times daily., Disp: 120 tablet, Rfl: 5 .  tiZANidine (ZANAFLEX) 4 MG capsule, Take 4 mg by mouth 3 (three) times daily as needed., Disp: , Rfl:  .  valsartan (DIOVAN) 160 MG tablet, Take 1 tablet (160 mg total) by mouth daily., Disp: 90 tablet, Rfl: 3 .  Vitamin D, Ergocalciferol, (DRISDOL) 50000 units CAPS capsule, TAKE ONE CAPSULE BY MOUTH ONCE A WEEK, Disp: 12 capsule, Rfl: 0  Current facility-administered medications:  .  triamcinolone acetonide (KENALOG-40) injection 40 mg, 40 mg, Intramuscular, Once, Bobetta Lime, MD  Allergies  Allergen Reactions  . Meperidine Hives  . Shellfish Allergy Shortness Of Breath and Swelling    ROS  CONSTITUTIONAL: No fever, chills, weakness or fatigue.  HEENT:  - Eyes: No visual changes.  - Ears: No auditory changes. No pain.  - Nose: Yes congestion and sinus pressure. - Throat: No sore throat. No changes in swallowing. SKIN: No rash or itching.  CARDIOVASCULAR: Yes chest pain, SOB, palpitations. Chronic edema.  RESPIRATORY: No cough or sputum.  GASTROINTESTINAL: No anorexia, nausea, vomiting. No changes in bowel  habits. No abdominal pain or blood.  GENITOURINARY: No dysuria. No frequency. No discharge.  NEUROLOGICAL: No headache, dizziness, syncope, paralysis, ataxia, numbness or tingling in the extremities. No memory changes. No change in bowel or bladder control.  MUSCULOSKELETAL: Chronic joint pain. No muscle pain. HEMATOLOGIC: No anemia, bleeding or bruising.  LYMPHATICS: No enlarged lymph nodes.  PSYCHIATRIC: No change in mood. No change in sleep pattern.  ENDOCRINOLOGIC: No reports of sweating, cold or heat intolerance. No polyuria or polydipsia.     Objective  Filed Vitals:   12/16/15 1559  BP: 122/80  Pulse: 104  Temp: 98.1 F (36.7 C)  TempSrc: Oral  Resp: 16  Height: 5\' 5"  (1.651 m)  Weight: 190 lb 3.2 oz (86.274 kg)  SpO2: 95%   Body mass index is 31.65 kg/(m^2).   Physical Exam  Constitutional: Patient appears well-developed and well-nourished. In no acute distress but does appear to  be fatigued from acute illness. HEENT:  - Head: Normocephalic and atraumatic.  - Ears: RIGHT TM bulging with minimal clear exudate, LEFT TM bulging with minimal clear exudate.  - Nose: Nasal mucosa boggy and congested.  - Mouth/Throat: Oropharynx is moist with slight erythema of bilateral tonsils without hypertrophy or exudates. Post nasal drainage present.  - Eyes: Conjunctivae clear, EOM movements normal. PERRLA. No scleral icterus.  Neck: Normal range of motion. Neck supple. No JVD present. No thyromegaly present. No local lymphadenopathy. Cardiovascular: Regular rate, regular rhythm with 2/6 SEM Pulmonary/Chest: Effort normal and breath sounds clear in all lung fields.  Musculoskeletal: Normal range of motion bilateral UE and LE, no joint effusions. Skin: Skin is warm and dry. No rash noted. Peripheral Vascular: Trace bilateral ankle edema Psychiatric: Patient has a normal mood and affect. Behavior is normal in office today. Judgment and thought content normal in office  today.   Assessment & Plan  1. Heart palpitations EKG reassuring, NSR with no ST segment changes or T wave inversions to suggest ACS. Further more symptoms are going on for 2 weeks. Anxiety vs intermittent arrhythmias vs electrolyte abnormalities vs cardiac strain. Will start with lab work and consider CTA or cardiac referral.  - CBC with Differential/Platelet - Comprehensive metabolic panel - TSH - T3, free - T4, free - D-Dimer, Quantitative - Magnesium  2. Shortness of breath Anxiety vs intermittent arrhythmias vs electrolyte abnormalities vs cardiac strain. Will start with lab work and consider CTA or cardiac referral.  - CBC with Differential/Platelet - Comprehensive metabolic panel - TSH - T3, free - T4, free - D-Dimer, Quantitative - Magnesium  3. Hypokalemia  - CBC with Differential/Platelet - Comprehensive metabolic panel - TSH - T3, free - T4, free - D-Dimer, Quantitative - Magnesium  4. Heart murmur More prominent than usual. May be due to cardiac demand, fluid overload. ACS less likely.   - CBC with Differential/Platelet - Comprehensive metabolic panel - TSH - T3, free - T4, free - D-Dimer, Quantitative - Magnesium  5. 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 She is not a cigarette smoker so risk of DVT/PE is less but we did discuss this as a possibility and I ordered D-dimer testing.   6. Anxiety and depression Keep this is mind as a possible trigger.  7. Recurrent maxillary sinusitis, unspecified chronicity  - amoxicillin-clavulanate (AUGMENTIN) 875-125 MG tablet; Take 1 tablet by mouth 2 (two) times daily.  Dispense: 20 tablet; Refill: 0  8. Antibiotic-induced yeast infection  - fluconazole (DIFLUCAN) 150 MG tablet; Take 1 tablet (150 mg total) by mouth once.  Dispense: 1 tablet; Refill: 0

## 2015-12-17 ENCOUNTER — Telehealth: Payer: Self-pay

## 2015-12-17 ENCOUNTER — Other Ambulatory Visit: Payer: Self-pay | Admitting: Family Medicine

## 2015-12-17 ENCOUNTER — Encounter: Payer: Self-pay | Admitting: Family Medicine

## 2015-12-17 DIAGNOSIS — E876 Hypokalemia: Secondary | ICD-10-CM

## 2015-12-17 DIAGNOSIS — R002 Palpitations: Secondary | ICD-10-CM

## 2015-12-17 DIAGNOSIS — R600 Localized edema: Secondary | ICD-10-CM

## 2015-12-17 DIAGNOSIS — R011 Cardiac murmur, unspecified: Secondary | ICD-10-CM

## 2015-12-17 LAB — CBC WITH DIFFERENTIAL/PLATELET
BASOS: 1 %
Basophils Absolute: 0 10*3/uL (ref 0.0–0.2)
EOS (ABSOLUTE): 0.3 10*3/uL (ref 0.0–0.4)
EOS: 3 %
HEMATOCRIT: 40.1 % (ref 34.0–46.6)
HEMOGLOBIN: 13.5 g/dL (ref 11.1–15.9)
IMMATURE GRANS (ABS): 0 10*3/uL (ref 0.0–0.1)
IMMATURE GRANULOCYTES: 0 %
LYMPHS ABS: 3.2 10*3/uL — AB (ref 0.7–3.1)
Lymphs: 40 %
MCH: 29 pg (ref 26.6–33.0)
MCHC: 33.7 g/dL (ref 31.5–35.7)
MCV: 86 fL (ref 79–97)
MONOS ABS: 0.6 10*3/uL (ref 0.1–0.9)
Monocytes: 8 %
Neutrophils Absolute: 3.8 10*3/uL (ref 1.4–7.0)
Neutrophils: 48 %
Platelets: 402 10*3/uL — ABNORMAL HIGH (ref 150–379)
RBC: 4.66 x10E6/uL (ref 3.77–5.28)
RDW: 13 % (ref 12.3–15.4)
WBC: 8 10*3/uL (ref 3.4–10.8)

## 2015-12-17 LAB — COMPREHENSIVE METABOLIC PANEL
ALBUMIN: 4.1 g/dL (ref 3.5–5.5)
ALT: 18 IU/L (ref 0–32)
AST: 18 IU/L (ref 0–40)
Albumin/Globulin Ratio: 1.6 (ref 1.1–2.5)
Alkaline Phosphatase: 45 IU/L (ref 39–117)
BUN / CREAT RATIO: 40 — AB (ref 9–23)
BUN: 31 mg/dL — AB (ref 6–24)
Bilirubin Total: <0.2 mg/dL (ref 0.0–1.2)
CALCIUM: 10 mg/dL (ref 8.7–10.2)
CO2: 32 mmol/L — AB (ref 18–29)
CREATININE: 0.78 mg/dL (ref 0.57–1.00)
Chloride: 92 mmol/L — ABNORMAL LOW (ref 96–106)
GFR calc Af Amer: 103 mL/min/{1.73_m2} (ref >59)
GFR, EST NON AFRICAN AMERICAN: 89 mL/min/{1.73_m2} (ref >59)
GLOBULIN, TOTAL: 2.6 g/dL (ref 1.5–4.5)
GLUCOSE: 103 mg/dL — AB (ref 65–99)
Potassium: 3.1 mmol/L — ABNORMAL LOW (ref 3.5–5.2)
SODIUM: 141 mmol/L (ref 134–144)
Total Protein: 6.7 g/dL (ref 6.0–8.5)

## 2015-12-17 LAB — T3, FREE: T3 FREE: 2.6 pg/mL (ref 2.0–4.4)

## 2015-12-17 LAB — D-DIMER, QUANTITATIVE: D-DIMER: 0.42 mg/L FEU (ref 0.00–0.49)

## 2015-12-17 LAB — TSH: TSH: 3.06 u[IU]/mL (ref 0.450–4.500)

## 2015-12-17 LAB — MAGNESIUM: Magnesium: 1.6 mg/dL (ref 1.6–2.3)

## 2015-12-17 LAB — T4, FREE: Free T4: 1.37 ng/dL (ref 0.82–1.77)

## 2015-12-17 MED ORDER — POTASSIUM BICARBONATE 25 MEQ PO TBEF: 50.0000 meq | EFFERVESCENT_TABLET | Freq: Two times a day (BID) | ORAL | Status: DC

## 2015-12-17 NOTE — Telephone Encounter (Signed)
Laura [name] was encouraged to go to the ER and she did, her K was lower, 2.5 in the ER, given IV potassium. I still want her to check her BMP next week, she agreed.

## 2015-12-17 NOTE — Telephone Encounter (Signed)
Patient notified about labs and was told she could increase Potassium med to 117meq a day due to it being low.  Patient states is currently already taking 15meq a day.  I notified patient there was nothing else Dr. Nadine Counts could do, due to being on a high dose of potassium already.  Patient notified Dr. Nadine Counts is going to place referral to Cardio, and if feeling really bad or symptoms get worse patient is reccommended to go to the ER for IV potassium, which would be the quickest way to increase level.

## 2015-12-19 ENCOUNTER — Other Ambulatory Visit: Payer: Self-pay | Admitting: Family Medicine

## 2015-12-23 ENCOUNTER — Other Ambulatory Visit: Payer: Self-pay | Admitting: Family Medicine

## 2015-12-23 DIAGNOSIS — E876 Hypokalemia: Secondary | ICD-10-CM

## 2015-12-23 DIAGNOSIS — R7303 Prediabetes: Secondary | ICD-10-CM

## 2015-12-23 HISTORY — DX: Prediabetes: R73.03

## 2015-12-23 LAB — BASIC METABOLIC PANEL
BUN / CREAT RATIO: 30 — AB (ref 9–23)
BUN: 24 mg/dL (ref 6–24)
CO2: 28 mmol/L (ref 18–29)
CREATININE: 0.79 mg/dL (ref 0.57–1.00)
Calcium: 9.3 mg/dL (ref 8.7–10.2)
Chloride: 89 mmol/L — ABNORMAL LOW (ref 96–106)
GFR calc non Af Amer: 88 mL/min/{1.73_m2} (ref >59)
GFR, EST AFRICAN AMERICAN: 101 mL/min/{1.73_m2} (ref >59)
Glucose: 89 mg/dL (ref 65–99)
Potassium: 3.6 mmol/L (ref 3.5–5.2)
Sodium: 135 mmol/L (ref 134–144)

## 2015-12-23 NOTE — Progress Notes (Signed)
Merdith had to go back to ER for IV iron, I spoke with ER physician who will be admitting her this time and re pleating potassium and magnesium as well as checking am cortisol levels for nephrogenic/adrenal source of electrolyte imbalance. I have placed urgent Nephrology referral.

## 2015-12-24 ENCOUNTER — Ambulatory Visit: Payer: BLUE CROSS/BLUE SHIELD | Admitting: Family Medicine

## 2015-12-27 ENCOUNTER — Encounter: Payer: Self-pay | Admitting: Family Medicine

## 2015-12-29 ENCOUNTER — Other Ambulatory Visit: Payer: Self-pay | Admitting: Family Medicine

## 2015-12-29 ENCOUNTER — Telehealth: Payer: Self-pay

## 2015-12-29 DIAGNOSIS — E876 Hypokalemia: Secondary | ICD-10-CM

## 2015-12-29 NOTE — Telephone Encounter (Signed)
Patient coming in tomorrow for Hospital f/u wants to get labs drawn.  Please order potassium

## 2015-12-29 NOTE — Telephone Encounter (Signed)
BMP lab ordered, please print out. Without considering lowering Furosemide dose I am not sure how else to help her maintain adequate K levels. I am awaiting final hospital notes. What dose of oral potassium is she taking now?

## 2015-12-30 ENCOUNTER — Ambulatory Visit
Admission: RE | Admit: 2015-12-30 | Discharge: 2015-12-30 | Disposition: A | Discharge status: Home / Self Care | Payer: BLUE CROSS/BLUE SHIELD | Source: Ambulatory Visit | Attending: Family Medicine | Admitting: Family Medicine

## 2015-12-30 ENCOUNTER — Encounter: Payer: Self-pay | Admitting: Family Medicine

## 2015-12-30 ENCOUNTER — Ambulatory Visit (INDEPENDENT_AMBULATORY_CARE_PROVIDER_SITE_OTHER): Payer: BLUE CROSS/BLUE SHIELD | Admitting: Family Medicine

## 2015-12-30 VITALS — BP 116/74 | HR 97 | Temp 97.6°F | Resp 14 | Ht 65.0 in | Wt 193.6 lb

## 2015-12-30 DIAGNOSIS — Z91013 Allergy to seafood: Secondary | ICD-10-CM

## 2015-12-30 DIAGNOSIS — R531 Weakness: Secondary | ICD-10-CM

## 2015-12-30 DIAGNOSIS — I10 Essential (primary) hypertension: Secondary | ICD-10-CM | POA: Diagnosis not present

## 2015-12-30 DIAGNOSIS — R002 Palpitations: Secondary | ICD-10-CM | POA: Diagnosis not present

## 2015-12-30 DIAGNOSIS — R42 Dizziness and giddiness: Secondary | ICD-10-CM

## 2015-12-30 DIAGNOSIS — E785 Hyperlipidemia, unspecified: Secondary | ICD-10-CM

## 2015-12-30 DIAGNOSIS — E876 Hypokalemia: Secondary | ICD-10-CM

## 2015-12-30 LAB — BASIC METABOLIC PANEL
BUN / CREAT RATIO: 20 (ref 9–23)
BUN: 17 mg/dL (ref 6–24)
CO2: 23 mmol/L (ref 18–29)
Calcium: 9.1 mg/dL (ref 8.7–10.2)
Chloride: 94 mmol/L — ABNORMAL LOW (ref 96–106)
Creatinine, Ser: 0.85 mg/dL (ref 0.57–1.00)
GFR, EST AFRICAN AMERICAN: 92 mL/min/{1.73_m2} (ref >59)
GFR, EST NON AFRICAN AMERICAN: 80 mL/min/{1.73_m2} (ref >59)
Glucose: 109 mg/dL — ABNORMAL HIGH (ref 65–99)
POTASSIUM: 4 mmol/L (ref 3.5–5.2)
SODIUM: 136 mmol/L (ref 134–144)

## 2015-12-30 MED ORDER — ATORVASTATIN CALCIUM 40 MG PO TABS: 40.0000 mg | ORAL_TABLET | Freq: Every day | ORAL | Status: DC

## 2015-12-30 MED ORDER — EPINEPHRINE 0.3 MG/0.3ML IJ SOAJ: 0.3000 mg | Freq: Once | INTRAMUSCULAR | Status: DC

## 2015-12-30 NOTE — Progress Notes (Signed)
Name: Laura [name]   MRN: [id number]    DOB: 20-Jan-1966   Date:12/30/2015       Progress Note  Subjective  Chief Complaint  Chief Complaint  Patient presents with  . Follow-up    Potassium states has an appointmnet with specialist that deals with potassium at [facility]  . Dizziness    Patient reports having blurred vision and off balance with muscle pain and weakness    HPI  Laura [name] is a 50 year old female here for hospital follow up. Hospitalized for chronic hypokalemia despite oral supplement up 100-125 mEq of potassium from 12/23/15 through 12/24/15. Other ongoing issues concerning nonspecific symptoms of cycle body rash, edema, fatigue, joint pain, HTN, pre-diabetes, obesity.   Today she is off balance and having blurred vision with muscle pain and weakness. Also having focus issues and difficulty driving. Her husband has driven her. But no focal neurological deficit, slurred speech or memory loss. If you may recall she did not tolerate statin previously and on hospital discharge she was placed on statin, benzo and tramadol which are not new to her. Has not taken the tramadol. The clonopin is not new to her, was on it previously, but finds that xanax works better. Overall patient is tearful and frustrated today.  She complains of chronic edema. The location of the edema is lower leg(s) bilateral but can affect arms as well. The edema has been mild to moderate and off and on. Onset of symptoms was several years ago, stable since that time with use of diuretic. The edema is present in the evening usually. The patient states the problem is long-standing. Strong family history of edema if females on mother's side. The swelling has been aggravated by dependency of involved area, relieved by diuretics, support stockings, elevation of involved area, and been associated with venous insufficiency. Cardiac risk factors include dyslipidemia, hypertension and obesity (BMI >= 30 kg/m2).   Her  potassium yesterday on 12/29/15 was 4.0. Changes to medication regimen to help conserve potassium was reducing lasix from 80mg  po bid to 60mg  po bid and adding on spirinolactone 50 mg. They also started atorvastatin 40 mg one po qhs.   Has appointment with [facility] electrolyte balance specialist.   Hospitalist also did 24hr urinary testing. Results pending.   Past Medical History  Diagnosis Date  . Vaginal enterocele   . High cholesterol   . GERD (gastroesophageal reflux disease)   . Exposure to hepatitis C   . Bacterial vaginitis   . Yeast infection   . Kidney disease   . Urinary retention with incomplete bladder emptying   . Vaginal dryness, menopausal   . Increased BMI   . Hypokalemia   . Rectocele   . Anxiety and depression   . Cystocele   . Elevated fasting blood sugar     Patient Active Problem List   Diagnosis Date Noted  . Heart palpitations 12/16/2015  . Shortness of breath 12/16/2015  . Heart murmur 12/16/2015  . Antibiotic-induced yeast infection 12/16/2015  . Rash, skin 11/19/2015  . Hyperlipidemia LDL goal <100 11/19/2015  . Vaginal odor 11/19/2015  . Easy bruisability 07/08/2015  . Blood glucose labile 06/30/2015  . GERD without esophagitis 06/13/2015  . 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 Hypokalemia 06/06/2015  . Bilateral lower extremity edema 06/06/2015  . Hot flashes due to surgical menopause 06/06/2015  . Chronic pain of multiple joints 06/06/2015  . Osteoarthrosis, generalized, multiple joints 06/06/2015  . Vaginal enterocele   .  Urinary retention with incomplete bladder emptying   . Vaginal dryness, menopausal   . Obesity, Class I, BMI 30.0-34.9 (see actual BMI)   . Rectocele   . Cystocele   . Anxiety and depression   . Acquired hypothyroidism 12/03/2014  . Hypertension goal BP (blood pressure) < 140/90 12/03/2014  . Asthma, mild intermittent 12/03/2014  . Avitaminosis D 12/03/2014    Social History  Substance Use Topics  . Smoking  status: Never Smoker   . Smokeless tobacco: Not on file  . Alcohol Use: No     Current outpatient prescriptions:  .  atorvastatin (LIPITOR) 40 MG tablet, Take 40 mg by mouth daily., Disp: , Rfl:  .  potassium chloride (K-DUR,KLOR-CON) 10 MEQ tablet, Take 10 mEq by mouth 3 (three) times daily., Disp: , Rfl:  .  amphetamine-dextroamphetamine (ADDERALL) 20 MG tablet, , Disp: , Rfl: 0 .  buPROPion (WELLBUTRIN XL) 150 MG 24 hr tablet, , Disp: , Rfl: 0 .  clonazePAM (KLONOPIN) 1 MG tablet, Take 1 tablet by mouth 2 (two) times daily., Disp: , Rfl: 0 .  diclofenac sodium (VOLTAREN) 1 % GEL, Apply topically 2 (two) times daily., Disp: , Rfl:  .  fluconazole (DIFLUCAN) 150 MG tablet, Take 1 tablet (150 mg total) by mouth once., Disp: 1 tablet, Rfl: 0 .  furosemide (LASIX) 20 MG tablet, Take 3 tablets by mouth 2 (two) times daily., Disp: , Rfl: 0 .  glucose blood (ACCU-CHEK SMARTVIEW) test strip, Use as instructed to check blood glucose once a day and prn, Disp: 50 each, Rfl: 1 .  Insulin Pen Needle 32G X 6 MM MISC, 1 Device by Does not apply route daily., Disp: 50 each, Rfl: 1 .  levothyroxine (SYNTHROID, LEVOTHROID) 125 MCG tablet, TAKE ONE TABLET BY MOUTH ONCE DAILY, Disp: 90 tablet, Rfl: 1 .  magnesium gluconate (MAGONATE) 500 MG tablet, Take 500 mg by mouth 3 (three) times daily., Disp: , Rfl:  .  magnesium oxide (MAG-OX) 400 MG tablet, TAKE ONE TABLET BY MOUTH ONCE DAILY, Disp: 90 tablet, Rfl: 0 .  metFORMIN (GLUCOPHAGE) 500 MG tablet, TAKE ONE TABLET BY MOUTH ONCE DAILY, Disp: 90 tablet, Rfl: 2 .  metroNIDAZOLE (METROGEL) 0.75 % vaginal gel, Place 1 Applicatorful vaginally daily as needed. Patient name previously [employer], Disp: 70 g, Rfl: 2 .  norgestimate-ethinyl estradiol (ORTHO-CYCLEN,SPRINTEC,PREVIFEM) 0.25-35 MG-MCG tablet, Take 1 tablet by mouth daily., Disp: 1 Package, Rfl: 11 .  pantoprazole (PROTONIX) 40 MG tablet, Take 1 tablet (40 mg total) by mouth daily., Disp: 90 tablet, Rfl:  2 .  spironolactone (ALDACTONE) 50 MG tablet, Take 1 tablet by mouth 2 (two) times daily., Disp: , Rfl: 0 .  tiZANidine (ZANAFLEX) 4 MG capsule, Take 4 mg by mouth 3 (three) times daily as needed., Disp: , Rfl:  .  traZODone (DESYREL) 100 MG tablet, Take 1 tablet by mouth as needed., Disp: , Rfl: 0 .  valsartan (DIOVAN) 160 MG tablet, Take 1 tablet (160 mg total) by mouth daily., Disp: 90 tablet, Rfl: 3 .  Vitamin D, Ergocalciferol, (DRISDOL) 50000 units CAPS capsule, TAKE ONE CAPSULE BY MOUTH ONCE A WEEK, Disp: 12 capsule, Rfl: 0  No past surgical history on file.  No family history on file.  Allergies  Allergen Reactions  . Meperidine Hives  . Shellfish Allergy Shortness Of Breath and Swelling     Review of Systems  CONSTITUTIONAL: No significant weight changes, fever, chills. Yes weakness or fatigue.  HEENT:  - Eyes: Yes visual  changes.  - Ears: No auditory changes. No pain.  - Nose: No sneezing, congestion, runny nose. - Throat: No sore throat. No changes in swallowing. SKIN: No rash or itching.  CARDIOVASCULAR: No chest pain, chest pressure or chest discomfort. No palpitations or edema.  RESPIRATORY: No shortness of breath, cough or sputum.  GASTROINTESTINAL: No anorexia, nausea, vomiting. No changes in bowel habits. No abdominal pain or blood.  GENITOURINARY: No dysuria. No frequency. No discharge. NEUROLOGICAL: No headache, syncope, paralysis, ataxia, numbness or tingling in the extremities. No memory changes. No change in bowel or bladder control.  MUSCULOSKELETAL: Yes joint pain. Yes muscle pain. HEMATOLOGIC: No anemia, bleeding or bruising.  LYMPHATICS: No enlarged lymph nodes.  PSYCHIATRIC: Yes change in mood. No change in sleep pattern.  ENDOCRINOLOGIC: No reports of sweating, cold or heat intolerance. No polyuria or polydipsia.     Objective  BP 116/74 mmHg  Pulse 97  Temp(Src) 97.6 F (36.4 C) (Oral)  Resp 14  Ht 5\' 5"  (1.651 m)  Wt 193 lb 9.6 oz (87.816  kg)  BMI 32.22 kg/m2  SpO2 97% Body mass index is 32.22 kg/(m^2).  Physical Exam  Constitutional: Patient appears well-developed and well-nourished. In no distress.  HEENT:  - Head: Normocephalic and atraumatic.  - Ears: Bilateral TMs gray, no erythema or effusion - Nose: Nasal mucosa moist - Mouth/Throat: Oropharynx is clear and moist. No tonsillar hypertrophy or erythema. No post nasal drainage.  - Eyes: Conjunctivae clear, EOM movements normal. PERRLA. No scleral icterus.  Neck: Normal range of motion. Neck supple. No JVD present. No thyromegaly present.  Cardiovascular: Regularly irregular rythm and No murmur heard.  Pulmonary/Chest: Effort normal and breath sounds normal. No respiratory distress. Musculoskeletal: Normal range of motion bilateral UE and LE, no joint effusions. Peripheral vascular: Bilateral LE no edema. Neurological: CN II-XII grossly intact with no focal deficits. Alert and oriented to person, place, and time. Coordination, balance, strength, speech and gait are normal.  Skin: Skin is warm and dry. No rash noted. No erythema.  Psychiatric: Patient has a tearful mood and affect. Behavior is normal in office today. Judgment and thought content normal in office today.   Recent Results (from the past 2160 hour(s))  CBC with Differential/Platelet     Status: Abnormal   Collection Time: 11/24/15  8:26 AM  Result Value Ref Range   WBC 7.3 3.4 - 10.8 x10E3/uL   RBC 4.39 3.77 - 5.28 x10E6/uL   Hemoglobin 12.5 11.1 - 15.9 g/dL   Hematocrit 38.1 34.0 - 46.6 %   MCV 87 79 - 97 fL   MCH 28.5 26.6 - 33.0 pg   MCHC 32.8 31.5 - 35.7 g/dL   RDW 13.7 12.3 - 15.4 %   Platelets 393 (H) 150 - 379 x10E3/uL   Neutrophils 44 %   Lymphs 40 %   Monocytes 8 %   Eos 7 %   Basos 1 %   Neutrophils Absolute 3.3 1.4 - 7.0 x10E3/uL   Lymphocytes Absolute 2.9 0.7 - 3.1 x10E3/uL   Monocytes Absolute 0.6 0.1 - 0.9 x10E3/uL   EOS (ABSOLUTE) 0.5 (H) 0.0 - 0.4 x10E3/uL   Basophils  Absolute 0.0 0.0 - 0.2 x10E3/uL   Immature Granulocytes 0 %   Immature Grans (Abs) 0.0 0.0 - 0.1 x10E3/uL  Comprehensive metabolic panel     Status: None   Collection Time: 11/24/15  8:26 AM  Result Value Ref Range   Glucose 97 65 - 99 mg/dL   BUN 18 6 -  24 mg/dL   Creatinine, Ser 0.88 0.57 - 1.00 mg/dL   GFR calc non Af Amer 77 >59 mL/min/1.73   GFR calc Af Amer 89 >59 mL/min/1.73   BUN/Creatinine Ratio 20 9 - 23   Sodium 141 134 - 144 mmol/L   Potassium 4.3 3.5 - 5.2 mmol/L   Chloride 101 96 - 106 mmol/L   CO2 26 18 - 29 mmol/L   Calcium 9.2 8.7 - 10.2 mg/dL   Total Protein 6.0 6.0 - 8.5 g/dL   Albumin 3.7 3.5 - 5.5 g/dL   Globulin, Total 2.3 1.5 - 4.5 g/dL   Albumin/Globulin Ratio 1.6 1.1 - 2.5   Bilirubin Total <0.2 0.0 - 1.2 mg/dL   Alkaline Phosphatase 61 39 - 117 IU/L   AST 11 0 - 40 IU/L   ALT 9 0 - 32 IU/L  Hemoglobin A1c     Status: None   Collection Time: 11/24/15  8:26 AM  Result Value Ref Range   Hgb A1c MFr Bld 5.6 4.8 - 5.6 %    Comment:          Pre-diabetes: 5.7 - 6.4          Diabetes: >6.4          Glycemic control for adults with diabetes: <7.0    Est. average glucose Bld gHb Est-mCnc 114 mg/dL  Lipid panel     Status: Abnormal   Collection Time: 11/24/15  8:26 AM  Result Value Ref Range   Cholesterol, Total 238 (H) 100 - 199 mg/dL   Triglycerides 319 (H) 0 - 149 mg/dL   HDL 50 >39 mg/dL   VLDL Cholesterol Cal 64 (H) 5 - 40 mg/dL   LDL Calculated 124 (H) 0 - 99 mg/dL   Chol/HDL Ratio 4.8 (H) 0.0 - 4.4 ratio units    Comment:                                   T. Chol/HDL Ratio                                             Men  Women                               1/2 Avg.Risk  3.4    3.3                                   Avg.Risk  5.0    4.4                                2X Avg.Risk  9.6    7.1                                3X Avg.Risk 23.4   11.0   TSH     Status: Abnormal   Collection Time: 11/24/15  8:26 AM  Result Value Ref Range   TSH 4.840  (H) 0.450 - 4.500 uIU/mL  T3, free     Status: None   Collection  Time: 11/24/15  8:26 AM  Result Value Ref Range   T3, Free 2.2 2.0 - 4.4 pg/mL  T4, free     Status: None   Collection Time: 11/24/15  8:26 AM  Result Value Ref Range   Free T4 1.12 0.82 - 1.77 ng/dL  VITAMIN D 25 Hydroxy (Vit-D Deficiency, Fractures)     Status: None   Collection Time: 11/24/15  8:26 AM  Result Value Ref Range   Vit D, 25-Hydroxy 37.4 30.0 - 100.0 ng/mL    Comment: Vitamin D deficiency has been defined by the [place] and an Endocrine Society practice guideline as a level of serum 25-OH vitamin D less than 20 ng/mL (1,2). The Endocrine Society went on to further define vitamin D insufficiency as a level between 21 and 29 ng/mL (2). 1. IOM (Institute of Medicine). 2010. Dietary reference    intakes for calcium and D. [place]: The    [employer]. 2. Holick MF, Binkley [place], Bischoff-Ferrari HA, et al.    Evaluation, treatment, and prevention of vitamin D    deficiency: an Endocrine Society clinical practice    guideline. JCEM. 2011 Jul; 96(7):1911-30.   CBC with Differential/Platelet     Status: Abnormal   Collection Time: 12/16/15  4:32 PM  Result Value Ref Range   WBC 8.0 3.4 - 10.8 x10E3/uL   RBC 4.66 3.77 - 5.28 x10E6/uL   Hemoglobin 13.5 11.1 - 15.9 g/dL   Hematocrit 40.1 34.0 - 46.6 %   MCV 86 79 - 97 fL   MCH 29.0 26.6 - 33.0 pg   MCHC 33.7 31.5 - 35.7 g/dL   RDW 13.0 12.3 - 15.4 %   Platelets 402 (H) 150 - 379 x10E3/uL   Neutrophils 48 %   Lymphs 40 %   Monocytes 8 %   Eos 3 %   Basos 1 %   Neutrophils Absolute 3.8 1.4 - 7.0 x10E3/uL   Lymphocytes Absolute 3.2 (H) 0.7 - 3.1 x10E3/uL   Monocytes Absolute 0.6 0.1 - 0.9 x10E3/uL   EOS (ABSOLUTE) 0.3 0.0 - 0.4 x10E3/uL   Basophils Absolute 0.0 0.0 - 0.2 x10E3/uL   Immature Granulocytes 0 %   Immature Grans (Abs) 0.0 0.0 - 0.1 x10E3/uL  Comprehensive metabolic panel     Status: Abnormal   Collection  Time: 12/16/15  4:32 PM  Result Value Ref Range   Glucose 103 (H) 65 - 99 mg/dL   BUN 31 (H) 6 - 24 mg/dL   Creatinine, Ser 0.78 0.57 - 1.00 mg/dL   GFR calc non Af Amer 89 >59 mL/min/1.73   GFR calc Af Amer 103 >59 mL/min/1.73   BUN/Creatinine Ratio 40 (H) 9 - 23   Sodium 141 134 - 144 mmol/L   Potassium 3.1 (L) 3.5 - 5.2 mmol/L   Chloride 92 (L) 96 - 106 mmol/L   CO2 32 (H) 18 - 29 mmol/L   Calcium 10.0 8.7 - 10.2 mg/dL   Total Protein 6.7 6.0 - 8.5 g/dL   Albumin 4.1 3.5 - 5.5 g/dL   Globulin, Total 2.6 1.5 - 4.5 g/dL   Albumin/Globulin Ratio 1.6 1.1 - 2.5    Comment: **Effective January 12, 2016 the reference interval**   for A/G Ratio will be changing to:              Age                Female  Female           0 -  7 days       1.1 - 2.3       1.1 - 2.3           8 - 30 days       1.2 - 2.8       1.2 - 2.8           1 -  6 months     1.3 - 3.6       1.3 - 3.6    7 months -  5 years      1.5 - 2.6       1.5 - 2.6              > 5 years      1.2 - 2.2       1.2 - 2.2    Bilirubin Total <0.2 0.0 - 1.2 mg/dL   Alkaline Phosphatase 45 39 - 117 IU/L   AST 18 0 - 40 IU/L   ALT 18 0 - 32 IU/L  TSH     Status: None   Collection Time: 12/16/15  4:32 PM  Result Value Ref Range   TSH 3.060 0.450 - 4.500 uIU/mL  T3, free     Status: None   Collection Time: 12/16/15  4:32 PM  Result Value Ref Range   T3, Free 2.6 2.0 - 4.4 pg/mL  T4, free     Status: None   Collection Time: 12/16/15  4:32 PM  Result Value Ref Range   Free T4 1.37 0.82 - 1.77 ng/dL  D-Dimer, Quantitative     Status: None   Collection Time: 12/16/15  4:32 PM  Result Value Ref Range   D-DIMER 0.42 0.00 - 0.49 mg/L FEU    Comment: According to the assay manufacturer's published package insert, a normal (<0.50 mg/L FEU) D-dimer result in conjunction with a non-high clinical probability assessment, excludes deep vein thrombosis (DVT) and pulmonary embolism (PE) with high sensitivity. D-dimer values increase  with age and this can make VTE exclusion of an older population difficult. To address this, the [place], based on best available evidence and recent guidelines, recommends that clinicians use age-adjusted D-dimer thresholds in patients greater than 75 years of age with: a) a low probability of PE who do not meet all Pulmonary Embolism Rule Out Criteria, or b) in those with intermediate probability of PE. The formula for an age-adjusted D-dimer cut-off is "age/100". For example, a 51 year old patient would have an age-adjusted cut-off of 0.60 mg/L FEU and an 51 year old 0.80 mg/L FEU.   Magnesium     Status: None   Collection Time: 12/16/15  4:32 PM  Result Value Ref Range   Magnesium 1.6 1.6 - 2.3 mg/dL  Basic Metabolic Panel (BMET)     Status: Abnormal   Collection Time: 12/22/15 12:26 PM  Result Value Ref Range   Glucose 89 65 - 99 mg/dL   BUN 24 6 - 24 mg/dL   Creatinine, Ser 0.79 0.57 - 1.00 mg/dL   GFR calc non Af Amer 88 >59 mL/min/1.73   GFR calc Af Amer 101 >59 mL/min/1.73   BUN/Creatinine Ratio 30 (H) 9 - 23   Sodium 135 134 - 144 mmol/L   Potassium 3.6 3.5 - 5.2 mmol/L   Chloride 89 (L) 96 - 106 mmol/L   CO2 28 18 - 29 mmol/L  Calcium 9.3 8.7 - 10.2 mg/dL  Basic metabolic panel     Status: Abnormal   Collection Time: 12/29/15 12:27 PM  Result Value Ref Range   Glucose 109 (H) 65 - 99 mg/dL   BUN 17 6 - 24 mg/dL   Creatinine, Ser 0.85 0.57 - 1.00 mg/dL   GFR calc non Af Amer 80 >59 mL/min/1.73   GFR calc Af Amer 92 >59 mL/min/1.73   BUN/Creatinine Ratio 20 9 - 23   Sodium 136 134 - 144 mmol/L   Potassium 4.0 3.5 - 5.2 mmol/L   Chloride 94 (L) 96 - 106 mmol/L   CO2 23 18 - 29 mmol/L   Calcium 9.1 8.7 - 10.2 mg/dL     Assessment & Plan  1. Hypertension goal BP (blood pressure) < 140/90 Well controled on new regimen.  - CBC with Differential/Platelet - Comprehensive metabolic panel  2. Hyperlipidemia LDL goal <100 Tolerating statin  therapy.  - CBC with Differential/Platelet - Comprehensive metabolic panel - atorvastatin (LIPITOR) 40 MG tablet; Take 1 tablet (40 mg total) by mouth daily.  Dispense: 90 tablet; Refill: 2  3. Hypokalemia Will recheck again today.  - Comprehensive metabolic panel  4. Weakness Etiologies unclear but we discuss possibly related to new medication regimen, changes yet again to her electrolyte balance (will get lab work), rule out stroke although neurological exam intact a cerebellar stroke should be ruled out.  - Magnesium - EKG 12-Lead - CT Head Wo Contrast; STAT, scheduled for today 12/30/15   5. Heart palpitations EKG shows sinus rhythm with PVCs about every 5 beats, no ST segment changes or contiguous T wave inversions.   - EKG 12-Lead - Troponin I  6. Shellfish allergy Refilled per her request  - EPINEPHRINE 0.3 mg/0.3 mL IJ SOAJ injection; Inject 0.3 mLs (0.3 mg total) into the muscle once.  Dispense: 2 Device; Refill: 1  7. Dizziness See A&P #4  - CT Head Wo Contrast;

## 2015-12-31 ENCOUNTER — Other Ambulatory Visit: Payer: Self-pay | Admitting: Family Medicine

## 2015-12-31 DIAGNOSIS — E876 Hypokalemia: Secondary | ICD-10-CM

## 2015-12-31 LAB — CBC WITH DIFFERENTIAL/PLATELET
BASOS: 0 %
Basophils Absolute: 0 10*3/uL (ref 0.0–0.2)
EOS (ABSOLUTE): 0.3 10*3/uL (ref 0.0–0.4)
EOS: 4 %
HEMATOCRIT: 40.9 % (ref 34.0–46.6)
HEMOGLOBIN: 13.5 g/dL (ref 11.1–15.9)
IMMATURE GRANS (ABS): 0 10*3/uL (ref 0.0–0.1)
IMMATURE GRANULOCYTES: 0 %
LYMPHS: 29 %
Lymphocytes Absolute: 2.1 10*3/uL (ref 0.7–3.1)
MCH: 28.4 pg (ref 26.6–33.0)
MCHC: 33 g/dL (ref 31.5–35.7)
MCV: 86 fL (ref 79–97)
MONOCYTES: 6 %
Monocytes Absolute: 0.4 10*3/uL (ref 0.1–0.9)
NEUTROS ABS: 4.5 10*3/uL (ref 1.4–7.0)
NEUTROS PCT: 61 %
Platelets: 389 10*3/uL — ABNORMAL HIGH (ref 150–379)
RBC: 4.75 x10E6/uL (ref 3.77–5.28)
RDW: 13.7 % (ref 12.3–15.4)
WBC: 7.5 10*3/uL (ref 3.4–10.8)

## 2015-12-31 LAB — COMPREHENSIVE METABOLIC PANEL
ALBUMIN: 4.3 g/dL (ref 3.5–5.5)
ALK PHOS: 48 IU/L (ref 39–117)
ALT: 21 IU/L (ref 0–32)
AST: 16 IU/L (ref 0–40)
Albumin/Globulin Ratio: 1.6 (ref 1.1–2.5)
BUN / CREAT RATIO: 24 — AB (ref 9–23)
BUN: 22 mg/dL (ref 6–24)
Bilirubin Total: <0.2 mg/dL (ref 0.0–1.2)
CALCIUM: 9.7 mg/dL (ref 8.7–10.2)
CO2: 25 mmol/L (ref 18–29)
CREATININE: 0.93 mg/dL (ref 0.57–1.00)
Chloride: 93 mmol/L — ABNORMAL LOW (ref 96–106)
GFR calc Af Amer: 83 mL/min/{1.73_m2} (ref >59)
GFR, EST NON AFRICAN AMERICAN: 72 mL/min/{1.73_m2} (ref >59)
GLOBULIN, TOTAL: 2.7 g/dL (ref 1.5–4.5)
GLUCOSE: 92 mg/dL (ref 65–99)
Potassium: 4.7 mmol/L (ref 3.5–5.2)
Sodium: 134 mmol/L (ref 134–144)
TOTAL PROTEIN: 7 g/dL (ref 6.0–8.5)

## 2015-12-31 LAB — MAGNESIUM: MAGNESIUM: 2.2 mg/dL (ref 1.6–2.3)

## 2015-12-31 LAB — TROPONIN I: Troponin I: 0.01 ng/mL (ref 0.00–0.04)

## 2015-12-31 MED ORDER — POTASSIUM CHLORIDE CRYS ER 10 MEQ PO TBCR: 10.0000 meq | EXTENDED_RELEASE_TABLET | Freq: Three times a day (TID) | ORAL | Status: DC

## 2016-01-05 ENCOUNTER — Telehealth: Payer: Self-pay

## 2016-01-05 DIAGNOSIS — E876 Hypokalemia: Secondary | ICD-10-CM

## 2016-01-05 NOTE — Telephone Encounter (Signed)
Patient called stating you were going to order lab to get potassium rechecked again?  Patient states had conversation on my chart b/c you do want her potassium to get to high?  If so please order.

## 2016-01-05 NOTE — Telephone Encounter (Signed)
Labs ordered. Please print our for patient.

## 2016-01-05 NOTE — Telephone Encounter (Signed)
Patient notified

## 2016-01-06 ENCOUNTER — Other Ambulatory Visit: Payer: Self-pay | Admitting: Family Medicine

## 2016-01-07 ENCOUNTER — Encounter: Payer: Self-pay | Admitting: Family Medicine

## 2016-01-07 LAB — BASIC METABOLIC PANEL
BUN / CREAT RATIO: 19 (ref 9–23)
BUN: 17 mg/dL (ref 6–24)
CO2: 25 mmol/L (ref 18–29)
Calcium: 9.4 mg/dL (ref 8.7–10.2)
Chloride: 93 mmol/L — ABNORMAL LOW (ref 96–106)
Creatinine, Ser: 0.9 mg/dL (ref 0.57–1.00)
GFR calc Af Amer: 86 mL/min/{1.73_m2} (ref >59)
GFR calc non Af Amer: 75 mL/min/{1.73_m2} (ref >59)
GLUCOSE: 97 mg/dL (ref 65–99)
POTASSIUM: 4.6 mmol/L (ref 3.5–5.2)
SODIUM: 135 mmol/L (ref 134–144)

## 2016-01-09 ENCOUNTER — Other Ambulatory Visit: Payer: Self-pay | Admitting: Family Medicine

## 2016-01-09 ENCOUNTER — Telehealth: Payer: Self-pay | Admitting: Family Medicine

## 2016-01-09 DIAGNOSIS — F32A Depression, unspecified: Secondary | ICD-10-CM

## 2016-01-09 DIAGNOSIS — F419 Anxiety disorder, unspecified: Principal | ICD-10-CM

## 2016-01-09 DIAGNOSIS — F329 Major depressive disorder, single episode, unspecified: Secondary | ICD-10-CM

## 2016-01-09 MED ORDER — CLONAZEPAM 1 MG PO TABS: 1.0000 mg | ORAL_TABLET | Freq: Two times a day (BID) | ORAL | Status: DC | PRN

## 2016-01-09 NOTE — Telephone Encounter (Signed)
Company faxed over another form.  It was filled out and faxed back in.  Faxed confirmed.

## 2016-01-09 NOTE — Telephone Encounter (Signed)
Patient is checking status on short term disability ([place] [employer]-attending physicians statement) paper work that should have been filled out and faxed back to her job. Stated that her job faxed over the paperwork on 01-05-16

## 2016-01-12 ENCOUNTER — Encounter: Payer: Self-pay | Admitting: Family Medicine

## 2016-01-14 ENCOUNTER — Other Ambulatory Visit: Payer: Self-pay

## 2016-01-14 ENCOUNTER — Telehealth: Payer: Self-pay | Admitting: Family Medicine

## 2016-01-14 ENCOUNTER — Other Ambulatory Visit: Payer: Self-pay | Admitting: Family Medicine

## 2016-01-14 NOTE — Telephone Encounter (Signed)
Pt called and wanted to know if she can get an extended work note. Pt states she is still not able to drive and wants to get a note until she sees her Cardiologist on 02/05/2016. Pt states if she goes back to work 1 day it will mess up her disabilitiy. Please advise pt.

## 2016-01-14 NOTE — Progress Notes (Signed)
Date error on initial letter, corrected.

## 2016-01-14 NOTE — Progress Notes (Signed)
Patient still can not drive safely (see her MyChart messages to me) and requested letter to keep her out of work until after she sees the specialists she has been referred to.

## 2016-01-19 ENCOUNTER — Telehealth: Payer: Self-pay | Admitting: Family Medicine

## 2016-01-19 NOTE — Telephone Encounter (Signed)
Pt needs call back about disability forms that was given to sundaram on Friday. Please advise if done or not.

## 2016-01-20 NOTE — Telephone Encounter (Signed)
Dr. Nadine Counts already gone and did not fill out.  Mailed back to patient to get specialist to fill out.

## 2016-01-26 ENCOUNTER — Telehealth: Payer: Self-pay

## 2016-01-26 ENCOUNTER — Other Ambulatory Visit: Payer: Self-pay

## 2016-01-26 NOTE — Telephone Encounter (Signed)
Sorry. I don't feel comfortable changing. She also sees a sub-specialist at [facility] and they have been adjusting her diuretics. She can contact [facility] and see if they want to adjust her dose.

## 2016-01-26 NOTE — Telephone Encounter (Signed)
Patient called needs refill on Klor con, but states Dr. Nadine Counts wrote it wrong.  She was on klor con 20 bid.  But could not swallow so decreased to 10 and was suppost to be qid, but only wrote for tid.  Can you please change?

## 2016-01-27 NOTE — Telephone Encounter (Signed)
Patient is very upset and states she doesn't understand! She will end up in the hospital again like she has several times over the past couple of months due to her potassium being to low.

## 2016-02-02 ENCOUNTER — Other Ambulatory Visit: Payer: Self-pay

## 2016-02-02 ENCOUNTER — Encounter: Payer: Self-pay | Admitting: Family Medicine

## 2016-02-02 ENCOUNTER — Ambulatory Visit (INDEPENDENT_AMBULATORY_CARE_PROVIDER_SITE_OTHER): Payer: BLUE CROSS/BLUE SHIELD | Admitting: Family Medicine

## 2016-02-02 VITALS — BP 122/82 | HR 86 | Temp 97.7°F | Resp 14 | Wt 191.4 lb

## 2016-02-02 DIAGNOSIS — K219 Gastro-esophageal reflux disease without esophagitis: Secondary | ICD-10-CM | POA: Diagnosis not present

## 2016-02-02 DIAGNOSIS — J4520 Mild intermittent asthma, uncomplicated: Secondary | ICD-10-CM | POA: Diagnosis not present

## 2016-02-02 DIAGNOSIS — R011 Cardiac murmur, unspecified: Secondary | ICD-10-CM

## 2016-02-02 DIAGNOSIS — E876 Hypokalemia: Secondary | ICD-10-CM | POA: Diagnosis not present

## 2016-02-02 DIAGNOSIS — R600 Localized edema: Secondary | ICD-10-CM | POA: Diagnosis not present

## 2016-02-02 DIAGNOSIS — R42 Dizziness and giddiness: Secondary | ICD-10-CM

## 2016-02-02 LAB — BASIC METABOLIC PANEL
BUN / CREAT RATIO: 23 (ref 9–23)
BUN: 20 mg/dL (ref 6–24)
CO2: 26 mmol/L (ref 18–29)
CREATININE: 0.87 mg/dL (ref 0.57–1.00)
Calcium: 10 mg/dL (ref 8.7–10.2)
Chloride: 96 mmol/L (ref 96–106)
GFR, EST AFRICAN AMERICAN: 90 mL/min/{1.73_m2} (ref >59)
GFR, EST NON AFRICAN AMERICAN: 78 mL/min/{1.73_m2} (ref >59)
Glucose: 102 mg/dL — ABNORMAL HIGH (ref 65–99)
Potassium: 5.7 mmol/L — ABNORMAL HIGH (ref 3.5–5.2)
SODIUM: 140 mmol/L (ref 134–144)

## 2016-02-02 MED ORDER — MONTELUKAST SODIUM 10 MG PO TABS: 10.0000 mg | ORAL_TABLET | Freq: Every day | ORAL | Status: DC

## 2016-02-02 MED ORDER — ALBUTEROL SULFATE HFA 108 (90 BASE) MCG/ACT IN AERS: 2.0000 | INHALATION_SPRAY | RESPIRATORY_TRACT | Status: DC | PRN

## 2016-02-02 MED ORDER — POTASSIUM CHLORIDE CRYS ER 10 MEQ PO TBCR: 40.0000 meq | EXTENDED_RELEASE_TABLET | Freq: Three times a day (TID) | ORAL | Status: DC

## 2016-02-02 MED ORDER — VALSARTAN 160 MG PO TABS: 160.0000 mg | ORAL_TABLET | Freq: Every day | ORAL | Status: DC

## 2016-02-02 NOTE — Assessment & Plan Note (Addendum)
Seeing cardiologist and will likely get echo soon; also see vascular doctor; patient is on excessive amounts of diuretics, and has been evaluated by nephrologist who agrees that decreasing her diuretics would be ideal; she is upcoming appts with vascular and cardiologist for this purpose (to see if edema due to other factor and to see if any heart failure); I will not change dose today as she is not interested in that right now, but will look for changing dose at f/u later this week; she has not tolerated compression stockings; last albumin was normal

## 2016-02-02 NOTE — Assessment & Plan Note (Addendum)
Refill of SABA; only use if needed; Mucinex DM; add singulair

## 2016-02-02 NOTE — Assessment & Plan Note (Signed)
Avoiding citrus and high acidic; on PPI which may decrease Mg2+ absorption

## 2016-02-02 NOTE — Patient Instructions (Addendum)
Return for re-evaluation and paperwork later this week (Thursday or Friday) Go right to the emergency room if you feel worse, or develop any chest pain I very much want to work with you on decreasing your lasix (furosemide) We'll contact you with the lab results from today Start the singulair to help with your asthma; be aware that the inhaler can affect your potassium and cause it to drop, so only use it if absolutely necessary

## 2016-02-02 NOTE — Assessment & Plan Note (Addendum)
Check stat K+ today; also will check Mg2+; check EKG today; chronic issue, and this is my first time seeing patient; she had been taking 60 mg of lasix TID as well as 40 mEq of KCl TID prior to today; review of nephrologist note suggests that her low K+ is most likely related to her lasix use; I do not know why she is on this much lasix, but I will be working with patient to get her dose down; the spironolactone was added by last doctor in the last month, and she is on an ARB; will have to watch this closely to avoid over-replacement

## 2016-02-02 NOTE — Progress Notes (Signed)
BP 122/82 mmHg  Pulse 86  Temp(Src) 97.7 F (36.5 C) (Oral)  Resp 14  Wt 191 lb 6.4 oz (86.818 kg)  SpO2 97%   Subjective:    Patient ID: Laura [name], female    DOB: [birth year], 50 y.o.   MRN: [id number]  HPI: Laura [name] is a 50 y.o. female  Chief Complaint  Patient presents with  . Medication Refill    Low paotassium has been hospitalized several times.  Called last week states Dr. Nadine Counts wrote dosage for potassium wrong it is supposed to be 10 meg qid.  . Joint Pain  . Gait Problem    off balance  . Dizziness   She has had years of low potassium; she has been on 60 mg of lasix three times a day; has had weight gain of about 10 pounds around her waist; she does not think she has ever had an echo, but has had multiple EKGs; other symptoms too; dizziness, gait issues, shooting pains in joints she has never had pains in; knees, right shoulder; muscle fatigue; confusion; she does not think she needs to go to the ER; orthostatic hypotension; she is not normally confused She has only been taking  60 mg of lasix TID, but her potassium has not been what she is used to taking She was prescribed potassium 80 mEq TID, and then down to 60 mEq a day, and now is only taking 30 mEq of potassium for the last week; she is concerned that her potassium is going to be too low she has full work-up by nephrologist; she is going to see a cardiologist Dr. Rockey Situ soon She says they did an EKG here last time and it wasn't normal; also had one at the hospital and it wasn't normal either; T waves were upside down patient tells me Has been having heart palpitations with this Reviewed nephrologist note from March 17th; she saw a [facility] kidney specialist and that note mentioned that the cause of her low K+ was most likely related to the lasix therapy She is going to see vascular doctor, April 10th or 11th Dr. Nadine Counts did not want her to drive; on short-term disability, but needs to redo some paperwork; she  is not capable of driving; they are wanting to review her case  Coughing, get allergies and bronchitis; around grandchildren who have been sick; has asthma  Relevant past medical, surgical, family and social history reviewed and updated as indicated Past Medical History  Diagnosis Date  . Vaginal enterocele   . High cholesterol   . GERD (gastroesophageal reflux disease)   . Exposure to hepatitis C   . Bacterial vaginitis   . Yeast infection   . Kidney disease   . Urinary retention with incomplete bladder emptying   . Vaginal dryness, menopausal   . Increased BMI   . Hypokalemia   . Rectocele   . Anxiety and depression   . Cystocele   . Elevated fasting blood sugar    No past surgical history on file.  No family history on file.  Social History  Substance Use Topics  . Smoking status: Former Research scientist (life sciences)  . Smokeless tobacco: None  . Alcohol Use: 0.0 oz/week    0 Standard drinks or equivalent per week   Review of Systems Per HPI unless specifically indicated above     Objective:    BP 122/82 mmHg  Pulse 86  Temp(Src) 97.7 F (36.5 C) (Oral)  Resp 14  Wt 191  lb 6.4 oz (86.818 kg)  SpO2 97%  Wt Readings from Last 3 Encounters:  02/02/16 191 lb 6.4 oz (86.818 kg)  12/30/15 193 lb 9.6 oz (87.816 kg)  12/16/15 190 lb 3.2 oz (86.274 kg)    Physical Exam  Constitutional: She appears well-developed and well-nourished. No distress.  HENT:  Head: Normocephalic and atraumatic.  Right Ear: Hearing, tympanic membrane, external ear and ear canal normal. No drainage or swelling. Tympanic membrane is not erythematous.  Left Ear: Hearing, tympanic membrane, external ear and ear canal normal. No drainage or swelling. Tympanic membrane is not erythematous.  Nose: No rhinorrhea.  Mouth/Throat: Oropharynx is clear and moist and mucous membranes are normal.  Eyes: EOM are normal. Right eye exhibits no discharge. Left eye exhibits no discharge. No scleral icterus.  Cardiovascular:  Normal rate and regular rhythm.   No extrasystoles are present.  Pulmonary/Chest: Effort normal. She has wheezes (very slight wheezes with occasional cough).  Abdominal: She exhibits no distension.  Musculoskeletal: She exhibits no edema.  Psychiatric: She has a normal mood and affect.  Good eye contact with examiner      Assessment & Plan:   Problem List Items Addressed This Visit      Respiratory   Asthma, mild intermittent    Refill of SABA; only use if needed; Mucinex DM; add singulair      Relevant Medications   albuterol (PROVENTIL HFA;VENTOLIN HFA) 108 (90 Base) MCG/ACT inhaler   montelukast (SINGULAIR) 10 MG tablet     Digestive   GERD without esophagitis    Avoiding citrus and high acidic; on PPI which may decrease Mg2+ absorption        Other   Hypokalemia - Primary    Check stat K+ today; also will check Mg2+; check EKG today; chronic issue, and this is my first time seeing patient; she had been taking 60 mg of lasix TID as well as 40 mEq of KCl TID prior to today; review of nephrologist note suggests that her low K+ is most likely related to her lasix use; I do not know why she is on this much lasix, but I will be working with patient to get her dose down; the spironolactone was added by last doctor in the last month, and she is on an ARB; will have to watch this closely to avoid over-replacement      Relevant Orders   EKG [id number]   Basic metabolic panel   Magnesium   Bilateral lower extremity edema    Seeing cardiologist and will likely get echo soon; also see vascular doctor; patient is on excessive amounts of diuretics, and has been evaluated by nephrologist who agrees that decreasing her diuretics would be ideal; she is upcoming appts with vascular and cardiologist for this purpose (to see if edema due to other factor and to see if any heart failure); I will not change dose today as she is not interested in that right now, but will look for changing dose at f/u  later this week; she has not tolerated compression stockings; last albumin was normal      Heart murmur    Noted by previous doctor; I do not hear one today; I do not see an echo in this chart; she has appointment with cardiologist soon; will defer ordering echo, as I am sure that will be part of the work-up if not already done and accessible elsewhere in teh system      Dizziness    Noted by  previous doctor; she is not driving per her instructions; I do not know the etiology of this; she will return for further eval in a few days; to ER if worse         Follow up plan: Return 3-4 days, for follow-up.

## 2016-02-03 ENCOUNTER — Telehealth: Payer: Self-pay | Admitting: Family Medicine

## 2016-02-03 ENCOUNTER — Emergency Department
Admission: EM | Admit: 2016-02-03 | Discharge: 2016-02-03 | Disposition: A | Discharge status: Home / Self Care | Payer: BLUE CROSS/BLUE SHIELD | Attending: Emergency Medicine | Admitting: Emergency Medicine

## 2016-02-03 ENCOUNTER — Encounter: Payer: Self-pay | Admitting: Emergency Medicine

## 2016-02-03 DIAGNOSIS — E876 Hypokalemia: Secondary | ICD-10-CM

## 2016-02-03 DIAGNOSIS — R7989 Other specified abnormal findings of blood chemistry: Secondary | ICD-10-CM | POA: Diagnosis present

## 2016-02-03 DIAGNOSIS — Z87891 Personal history of nicotine dependence: Secondary | ICD-10-CM | POA: Insufficient documentation

## 2016-02-03 LAB — CBC WITH DIFFERENTIAL/PLATELET
Basophils Absolute: 0.1 10*3/uL (ref 0–0.1)
Basophils Relative: 1 %
EOS ABS: 0.4 10*3/uL (ref 0–0.7)
Eosinophils Relative: 6 %
HCT: 38.6 % (ref 35.0–47.0)
HEMOGLOBIN: 12.9 g/dL (ref 12.0–16.0)
Lymphocytes Relative: 29 %
Lymphs Abs: 2 10*3/uL (ref 1.0–3.6)
MCH: 28.6 pg (ref 26.0–34.0)
MCHC: 33.5 g/dL (ref 32.0–36.0)
MCV: 85.3 fL (ref 80.0–100.0)
MONOS PCT: 9 %
Monocytes Absolute: 0.6 10*3/uL (ref 0.2–0.9)
NEUTROS PCT: 55 %
Neutro Abs: 3.8 10*3/uL (ref 1.4–6.5)
Platelets: 338 10*3/uL (ref 150–440)
RBC: 4.53 MIL/uL (ref 3.80–5.20)
RDW: 13.6 % (ref 11.5–14.5)
WBC: 7 10*3/uL (ref 3.6–11.0)

## 2016-02-03 LAB — COMPREHENSIVE METABOLIC PANEL
ALK PHOS: 52 U/L (ref 38–126)
ALT: 19 U/L (ref 14–54)
ANION GAP: 7 (ref 5–15)
AST: 22 U/L (ref 15–41)
Albumin: 3.8 g/dL (ref 3.5–5.0)
BILIRUBIN TOTAL: 0.4 mg/dL (ref 0.3–1.2)
BUN: 19 mg/dL (ref 6–20)
CALCIUM: 9.1 mg/dL (ref 8.9–10.3)
CO2: 27 mmol/L (ref 22–32)
CREATININE: 0.7 mg/dL (ref 0.44–1.00)
Chloride: 100 mmol/L — ABNORMAL LOW (ref 101–111)
Glucose, Bld: 112 mg/dL — ABNORMAL HIGH (ref 65–99)
Potassium: 3.1 mmol/L — ABNORMAL LOW (ref 3.5–5.1)
SODIUM: 134 mmol/L — AB (ref 135–145)
TOTAL PROTEIN: 6.9 g/dL (ref 6.5–8.1)

## 2016-02-03 LAB — URINALYSIS COMPLETE WITH MICROSCOPIC (ARMC ONLY)
BILIRUBIN URINE: NEGATIVE
Bacteria, UA: NONE SEEN
GLUCOSE, UA: NEGATIVE mg/dL
HGB URINE DIPSTICK: NEGATIVE
KETONES UR: NEGATIVE mg/dL
LEUKOCYTES UA: NEGATIVE
NITRITE: NEGATIVE
PH: 7 (ref 5.0–8.0)
Protein, ur: NEGATIVE mg/dL
RBC / HPF: NONE SEEN RBC/hpf (ref 0–5)
Specific Gravity, Urine: 1.002 — ABNORMAL LOW (ref 1.005–1.030)

## 2016-02-03 MED ORDER — SODIUM CHLORIDE 0.9 % IV SOLN
Freq: Once | INTRAVENOUS | Status: AC
  Administered 2016-02-03: 07:00:00 via INTRAVENOUS

## 2016-02-03 MED ORDER — POTASSIUM CHLORIDE CRYS ER 10 MEQ PO TBCR: 40.0000 meq | EXTENDED_RELEASE_TABLET | Freq: Two times a day (BID) | ORAL | Status: DC

## 2016-02-03 MED ORDER — FUROSEMIDE 20 MG PO TABS: ORAL_TABLET | ORAL | Status: DC

## 2016-02-03 MED ORDER — POTASSIUM CHLORIDE CRYS ER 20 MEQ PO TBCR
20.0000 meq | EXTENDED_RELEASE_TABLET | Freq: Once | ORAL | Status: AC
  Administered 2016-02-03: 20 meq via ORAL
  Filled 2016-02-03: qty 1

## 2016-02-03 MED ORDER — SPIRONOLACTONE 50 MG PO TABS: 50.0000 mg | ORAL_TABLET | Freq: Two times a day (BID) | ORAL | Status: DC

## 2016-02-03 MED ORDER — KETOTIFEN FUMARATE 0.025 % OP SOLN: 2.0000 [drp] | Freq: Two times a day (BID) | OPHTHALMIC | Status: DC

## 2016-02-03 MED ORDER — POTASSIUM CHLORIDE 10 MEQ/100ML IV SOLN
10.0000 meq | Freq: Once | INTRAVENOUS | Status: AC
  Administered 2016-02-03: 10 meq via INTRAVENOUS
  Filled 2016-02-03: qty 100

## 2016-02-03 NOTE — ED Provider Notes (Addendum)
Time [name]: Approximately 0 510  I have reviewed the triage notes  Chief Complaint: Abnormal Lab   History of Present Illness: Laura [name] is a 50 y.o. female *who has a significant history of hypo-Kaylene DM. Patient has been hospitalized in the past for significantly low potassium. The patient states that she gets diffuse body aches and heart palpitations. She's been having these symptoms now for the last several days. Patient describes some generalized weakness. The patient saw her primary physician yesterday and had a potassium level of 5.7. He was referred to the emergency department for repeat evaluation of her potassium. She denies any nausea, vomiting, diarrhea, chest pain, syncope, or any other concerns. She does state that she's had some persistent nasal drainage and has had some redness in both of her eyes especially in the left eye. She only has some mild eye pain. She states she hasn't taken any antihistamines because of the risk of elevating her blood pressure. She denies any visual acuity changes. Past Medical History  Diagnosis Date  . Vaginal enterocele   . High cholesterol   . GERD (gastroesophageal reflux disease)   . Exposure to hepatitis C   . Bacterial vaginitis   . Yeast infection   . Kidney disease   . Urinary retention with incomplete bladder emptying   . Vaginal dryness, menopausal   . Increased BMI   . Hypokalemia   . Rectocele   . Anxiety and depression   . Cystocele   . Elevated fasting blood sugar     Patient Active Problem List   Diagnosis Date Noted  . Hypomagnesemia syndrome 01/05/2016  . Weakness 12/30/2015  . Shellfish allergy 12/30/2015  . Dizziness 12/30/2015  . Heart palpitations 12/16/2015  . Shortness of breath 12/16/2015  . Heart murmur 12/16/2015  . Rash, skin 11/19/2015  . Hyperlipidemia LDL goal <100 11/19/2015  . Vaginal odor 11/19/2015  . Easy bruisability 07/08/2015  . Blood glucose labile 06/30/2015  . GERD without  esophagitis 06/13/2015  . 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 Hypokalemia 06/06/2015  . Bilateral lower extremity edema 06/06/2015  . Hot flashes due to surgical menopause 06/06/2015  . Chronic pain of multiple joints 06/06/2015  . Osteoarthrosis, generalized, multiple joints 06/06/2015  . Vaginal enterocele   . Urinary retention with incomplete bladder emptying   . Vaginal dryness, menopausal   . Obesity, Class I, BMI 30.0-34.9 (see actual BMI)   . Rectocele   . Cystocele   . Anxiety and depression   . Acquired hypothyroidism 12/03/2014  . Hypertension goal BP (blood pressure) < 140/90 12/03/2014  . Asthma, mild intermittent 12/03/2014  . Avitaminosis D 12/03/2014    History reviewed. No pertinent past surgical history.  History reviewed. No pertinent past surgical history.  Current Outpatient Rx  Name  Route  Sig  Dispense  Refill  . albuterol (PROVENTIL HFA;VENTOLIN HFA) 108 (90 Base) MCG/ACT inhaler   Inhalation   Inhale 2 puffs into the lungs every 4 (four) hours as needed for wheezing or shortness of breath.   1 Inhaler   1   . ALPRAZolam (XANAX) 1 MG tablet   Oral   Take 1 mg by mouth 4 (four) times daily.         Marland Kitchen amphetamine-dextroamphetamine (ADDERALL) 20 MG tablet            0   . atorvastatin (LIPITOR) 40 MG tablet   Oral   Take 1 tablet (40 mg total) by mouth  daily.   90 tablet   2   . buPROPion (WELLBUTRIN XL) 150 MG 24 hr tablet            0   . diclofenac sodium (VOLTAREN) 1 % GEL   Topical   Apply topically 2 (two) times daily.         Marland Kitchen EPINEPHRINE 0.3 mg/0.3 mL IJ SOAJ injection   Intramuscular   Inject 0.3 mLs (0.3 mg total) into the muscle once.   2 Device   1     Dispense as written.   . furosemide (LASIX) 20 MG tablet   Oral   Take 3 tablets by mouth 3 (three) times daily.      0   . glucose blood (ACCU-CHEK SMARTVIEW) test strip      Use as instructed to check blood glucose once a day and prn   50 each    1   . Insulin Pen Needle 32G X 6 MM MISC   Does not apply   1 Device by Does not apply route daily.   50 each   1   . levothyroxine (SYNTHROID, LEVOTHROID) 125 MCG tablet      TAKE ONE TABLET BY MOUTH ONCE DAILY   90 tablet   1   . magnesium gluconate (MAGONATE) 500 MG tablet   Oral   Take 500 mg by mouth 3 (three) times daily.         . metFORMIN (GLUCOPHAGE) 500 MG tablet      TAKE ONE TABLET BY MOUTH ONCE DAILY   90 tablet   2   . metroNIDAZOLE (METROGEL) 0.75 % vaginal gel   Vaginal   Place 1 Applicatorful vaginally daily as needed. Patient name previously Tam Dutta   70 g   2   . montelukast (SINGULAIR) 10 MG tablet   Oral   Take 1 tablet (10 mg total) by mouth at bedtime. For allergies and asthma   90 tablet   3   . pantoprazole (PROTONIX) 40 MG tablet   Oral   Take 1 tablet (40 mg total) by mouth daily.   90 tablet   2   . potassium chloride (K-DUR,KLOR-CON) 10 MEQ tablet   Oral   Take 4 tablets (40 mEq total) by mouth 3 (three) times daily.   360 tablet   1   . spironolactone (ALDACTONE) 50 MG tablet   Oral   Take 1 tablet by mouth 2 (two) times daily.      0   . tiZANidine (ZANAFLEX) 4 MG capsule   Oral   Take 4 mg by mouth 3 (three) times daily as needed.         . traZODone (DESYREL) 100 MG tablet   Oral   Take 1 tablet by mouth as needed.      0   . valsartan (DIOVAN) 160 MG tablet   Oral   Take 1 tablet (160 mg total) by mouth daily.   90 tablet   3   . Vitamin D, Ergocalciferol, (DRISDOL) 50000 units CAPS capsule      TAKE ONE CAPSULE BY MOUTH ONCE A WEEK   12 capsule   0     Allergies:  Iodine; Meperidine; and Shellfish allergy  Family History: No family history on file.  Social History: Social History  Substance Use Topics  . Smoking status: Former Research scientist (life sciences)  . Smokeless tobacco: None  . Alcohol Use: 0.0 oz/week    0 Standard drinks or  equivalent per week     Review of Systems:   10 point review of  systems was performed and was otherwise negative:  Constitutional: No fever Eyes: No visual disturbances ENT: No sore throat, ear pain Cardiac: No chest pain Respiratory: No shortness of breath, wheezing, or stridor Abdomen: No abdominal pain, no vomiting, No diarrhea Endocrine: No weight loss, No night sweats Extremities: No peripheral edema, cyanosis Skin: No rashes, easy bruising Neurologic: No focal weakness, trouble with speech or swollowing Urologic: No dysuria, Hematuria, or urinary frequency Diffuse body aches  Physical Exam:  ED Triage Vitals  Enc Vitals Group     BP 02/03/16 0517 134/83 mmHg     Pulse Rate 02/03/16 0517 75     Resp 02/03/16 0517 15     Temp --      Temp src --      SpO2 02/03/16 0517 98 %     Weight --      Height --      Head Cir --      Peak Flow --      Pain Score 02/03/16 0032 6     Pain Loc --      Pain Edu? --      Excl. in [place]? --     General: Awake , Alert , and Oriented times 3; GCS 15 Head: Normal cephalic , atraumatic Eyes: Pupils equal , round, reactive to lightBilateral erythema of the conjunctiva are without any involvement of the iris. Nose/Throat: No nasal drainage, patent upper airway without erythema or exudate.  Neck: Supple, Full range of motion, No anterior adenopathy or palpable thyroid masses Lungs: Clear to ascultation without wheezes , rhonchi, or rales Heart: Regular rate, regular rhythm without murmurs , gallops , or rubs Abdomen: Soft, non tender without rebound, guarding , or rigidity; bowel sounds positive and symmetric in all 4 quadrants. No organomegaly .        Extremities: 2 plus symmetric pulses. No edema, clubbing or cyanosis Neurologic: normal ambulation, Motor symmetric without deficits, sensory intact Skin: warm, dry, no rashes   Labs:   All laboratory work was reviewed including any pertinent negatives or positives listed below:  Labs Reviewed  COMPREHENSIVE METABOLIC PANEL - Abnormal; Notable for  the following:    Sodium 134 (*)    Potassium 3.1 (*)    Chloride 100 (*)    Glucose, Bld 112 (*)    All other components within normal limits  URINALYSIS COMPLETEWITH MICROSCOPIC ([facility] ONLY) - Abnormal; Notable for the following:    Color, Urine COLORLESS (*)    APPearance CLEAR (*)    Specific Gravity, Urine 1.002 (*)    Squamous Epithelial / LPF 0-5 (*)    All other components within normal limits  CBC WITH DIFFERENTIAL/PLATELET  Potassium levels is 3.1     EKG:  ED ECG REPORT I, Daymon Larsen, the attending physician, personally viewed and interpreted this ECG.  Date: 02/03/2016 EKG Time: 0 030 Rate: *95 Rhythm: normal sinus rhythm QRS Axis: normal Intervals: normal ST/T Wave abnormalities: normal Conduction Disturbances: none Narrative Interpretation: unremarkable Low-voltage QRS Poor R-wave progression in the anterior leads with no acute ischemic changes No changes indicative of hypokalemia    ED Course:  Patient's stay here was uneventful and the patient remained hemodynamically stable. Patient was given IV 10 mEq of potassium with normal saline piggyback. She was also given 20 mEq by mouth potassium. She understands hypokalemia and was advised to follow up with  her primary physician for repeat assessment. The patient's of conjunctivitis appears to be allergic conjunctivitis and I felt this was unlikely to be infectious. Recommended eyedrops anti-histamine.    Assessment: * Hypokalemia Allergic conjunctivitis   Final Clinical Impression:   Final diagnoses:  Hypokalemia     Plan: * Outpatient management Patient was advised to return immediately if condition worsens. Patient was advised to follow up with their primary care physician or other specialized physicians involved in their outpatient care. The patient and/or family member/power of attorney had laboratory results reviewed at the bedside. All questions and concerns were addressed and appropriate  discharge instructions were distributed by the nursing staff.             Daymon Larsen, MD 02/03/16 [id number]  Daymon Larsen, MD 02/03/16 [phone]

## 2016-02-03 NOTE — Assessment & Plan Note (Signed)
Check labs Thursday

## 2016-02-03 NOTE — Telephone Encounter (Signed)
Patient states that Laura [name] did not receive her potassium, furosemide nor spironolactone prescriptions. She also stated that she is now home from the ER and her potassium level is different. Yesterday from [facility] it was 5.7 and at midnight it was 3.1

## 2016-02-03 NOTE — ED Notes (Signed)
Pharmacy called to verify that they were sending the KCL 10 meq/111ml

## 2016-02-03 NOTE — Discharge Instructions (Signed)

## 2016-02-03 NOTE — ED Notes (Signed)
Pt presents to ED with possible abnormal potassium level. Pt states the past couple of week she has had heart palpitations, weakness, confusion, and chest pain. Typically has hypokalemia and pt was concerned this may be the case again due to her symptoms. Seen by her pcp today and had labs drawn with a resulting k+ of 5.7. Pt reports she had taken 2 more 40mg  doses prior to hearing lab results so this value could be higher now. Currently has generalized pain everywhere.

## 2016-02-03 NOTE — Telephone Encounter (Signed)
I spoke with patient earlier; will have decrease her lasix by one extra pill today She verifies that she was taking 180 mg total of Lasix per day and 120 mEq of potassium per day when she had blood drawn at the ER and it was 3.1; I suspect the potassium drawn at [place] [facility] was hemolyzed Nephrology note says that too much lasix is the suspected cause of her hypokalemia ------------------------------------------- I want to decrease her lasix and therefore potassium even more; she is not on potassium-sparing diuretic and ARB; I believe once we get her lasix dose down, her potassium needs will be much less Roselyn Reef -- please contact patient's nephrologist first thing Wednesday morning so I can speak with her, as I want to run this new lower schedule of diuretic past her: 1.  I would like to decrease her lasix further to 60 mg BID (will be 120 mg, down from 180 mg total per day) and decrease KCl to 40 mEq BID (will be 80 mEq, down from 120 mEq per day) 2.  Should she remain on spironolactone 50 mg BID and the valsartan Also, I entered lab orders for Thursday; thank you

## 2016-02-03 NOTE — ED Notes (Addendum)
Pt reports that her MD told her that her K+ was high and that she needed to come to the ER - pt c/o muscle fatigue and pain all over her body

## 2016-02-04 NOTE — Telephone Encounter (Signed)
Patient notified and agrees

## 2016-02-04 NOTE — Assessment & Plan Note (Signed)
Noted by previous doctor; she is not driving per her instructions; I do not know the etiology of this; she will return for further eval in a few days; to ER if worse

## 2016-02-04 NOTE — Telephone Encounter (Signed)
I spoke with nephrologist; she agrees with the idea of decrease lasix and potassium equally (by 25-30%) at first, and then next step would be to decrease the spironolactone, so she's not left unabated on spironolactone plus ARB We'll watch her potassium closely over the coming weeks as we wean the lasix down ------------------ Laura [name]: please let patient know that I spoke with the nephrologist Have her take lasix as follows: SIXTY milligrams twice a day Have her take potassium as follows:  FORTY milliequivalents twice a day Check K+ tomorrow We'll probably check K+ again on Friday and Monday as we change dose This is a tough case

## 2016-02-04 NOTE — Assessment & Plan Note (Signed)
Noted by previous doctor; I do not hear one today; I do not see an echo in this chart; she has appointment with cardiologist soon; will defer ordering echo, as I am sure that will be part of the work-up if not already done and accessible elsewhere in teh system

## 2016-02-05 ENCOUNTER — Ambulatory Visit: Payer: Self-pay | Admitting: Cardiovascular Disease

## 2016-02-05 ENCOUNTER — Other Ambulatory Visit: Payer: Self-pay

## 2016-02-06 ENCOUNTER — Encounter: Payer: Self-pay | Admitting: Family Medicine

## 2016-02-06 ENCOUNTER — Other Ambulatory Visit
Admission: RE | Admit: 2016-02-06 | Discharge: 2016-02-06 | Disposition: A | Discharge status: Home / Self Care | Payer: BLUE CROSS/BLUE SHIELD | Source: Ambulatory Visit | Attending: Family Medicine | Admitting: Family Medicine

## 2016-02-06 ENCOUNTER — Ambulatory Visit (INDEPENDENT_AMBULATORY_CARE_PROVIDER_SITE_OTHER): Payer: BLUE CROSS/BLUE SHIELD | Admitting: Family Medicine

## 2016-02-06 VITALS — BP 112/84 | HR 104 | Temp 97.9°F | Resp 16 | Wt 191.0 lb

## 2016-02-06 DIAGNOSIS — R410 Disorientation, unspecified: Secondary | ICD-10-CM | POA: Diagnosis not present

## 2016-02-06 DIAGNOSIS — E876 Hypokalemia: Secondary | ICD-10-CM | POA: Diagnosis present

## 2016-02-06 DIAGNOSIS — R531 Weakness: Secondary | ICD-10-CM

## 2016-02-06 DIAGNOSIS — H539 Unspecified visual disturbance: Secondary | ICD-10-CM | POA: Diagnosis not present

## 2016-02-06 LAB — BASIC METABOLIC PANEL
ANION GAP: 7 (ref 5–15)
BUN: 13 mg/dL (ref 6–20)
CHLORIDE: 98 mmol/L — AB (ref 101–111)
CO2: 30 mmol/L (ref 22–32)
CREATININE: 0.83 mg/dL (ref 0.44–1.00)
Calcium: 8.8 mg/dL — ABNORMAL LOW (ref 8.9–10.3)
GFR calc non Af Amer: >60 mL/min (ref >60)
Glucose, Bld: 104 mg/dL — ABNORMAL HIGH (ref 65–99)
POTASSIUM: 3.3 mmol/L — AB (ref 3.5–5.1)
SODIUM: 135 mmol/L (ref 135–145)

## 2016-02-06 LAB — POTASSIUM: POTASSIUM: 4.9 mmol/L (ref 3.5–5.2)

## 2016-02-06 LAB — MAGNESIUM: MAGNESIUM: 1.8 mg/dL (ref 1.7–2.4)

## 2016-02-06 NOTE — Assessment & Plan Note (Signed)
Refer to neurologist 

## 2016-02-06 NOTE — Patient Instructions (Signed)
Please go from here to the hospital outpatient lab for bloodwork I'll call you when I get those results and we'll talk about what to do with your medicine  We'll have you see the neurologist Do not drive Do not work Practice safe precautions (no ladders, no baths, no swimming, etc.)

## 2016-02-06 NOTE — Assessment & Plan Note (Signed)
Redraw stat K+ at the hospital and will continue to try to lower diuretics

## 2016-02-06 NOTE — Progress Notes (Signed)
BP 112/84 mmHg  Pulse 104  Temp(Src) 97.9 F (36.6 C) (Oral)  Resp 16  Wt 191 lb (86.637 kg)  SpO2 97%   Subjective:    Patient ID: Laura [name], female    DOB: 15-Dec-1965, 50 y.o.   MRN: [id number]  HPI: CARLETA BOURDO is a 50 y.o. female  Chief Complaint  Patient presents with  . Follow-up    disability paperwork  . Abnormal Lab    hypokalemia: we decreased lasix to 60mg  bid and K+ 64meq bid    She is here for follow-up  She feels dizzy, light-headed maybe; hard to explain she says; skin in different areas is tender; when touched, the skin actually hurts; confusion; feels bad; new things are coming up that's she has never had before; she can't drive; she has had some vision changes; things she used to be able to see, she has trouble seeing things closer; this is drastically different all of a sudden Light sensitivity, sound sensitivity; whole body hurts; she has been tested for Lyme disease already Joints at the bases of the thumbs hurts; that is normal for her; she has arthritis in those joints; pain in the knees has been new, just in the last 4 weeks or so; not like taking a step wrong; will get shooting pain in the knee just hits her knee; right ankle hurting too; every muscle in her body hurts; muscles are burning and she feels so weak; she doesn't understand if her potassium is   Potassium yesterday was 4.9; previously on Monday, stat K+ was 5.7 but redraw at the hospital was 3.1; significant difference between hospital lab and [place] lab; will have her get labs at the hospital for the time-being She drinks excessive amounts of liquids; has been doing this for years She works Psychiatric nurse; does a lot of typing; however, has lots of pain in the right thumb and has trouble seeing the keyboard and focusing; looks at it like it's completely foreign  Relevant past medical, surgical, family and social history reviewed and updated as indicated. Interim medical history  since our last visit reviewed. No family hx of lupus or MS or epilepsy All the girls have edema in her family High cholesterol and high TG run in the family Father had a stroke  Allergies and medications reviewed and updated.  Review of Systems Per HPI unless specifically indicated above     Objective:    BP 112/84 mmHg  Pulse 104  Temp(Src) 97.9 F (36.6 C) (Oral)  Resp 16  Wt 191 lb (86.637 kg)  SpO2 97%  Wt Readings from Last 3 Encounters:  02/19/16 189 lb 8 oz (85.957 kg)  02/13/16 191 lb 9.6 oz (86.909 kg)  02/06/16 191 lb (86.637 kg)    Physical Exam  Constitutional: She appears well-developed and well-nourished. No distress.  Eyes: No scleral icterus.  Neck: No JVD present.  Cardiovascular: Normal rate and regular rhythm.   Pulmonary/Chest: Effort normal and breath sounds normal.  Musculoskeletal: She exhibits no edema.  Neurological: She is alert. She has normal strength. She displays no tremor. No cranial nerve deficit or sensory deficit. Gait normal.  Psychiatric: Her mood appears anxious.  Good eye contact with examiner    Results for orders placed or performed in visit on 02/05/16  Potassium  Result Value Ref Range   Potassium 4.9 3.5 - 5.2 mmol/L      Assessment & Plan:   Problem List Items Addressed This Visit  Nervous and Auditory   Confusion - Primary   Relevant Orders   Ambulatory referral to Neurology     Other   Hypokalemia    Redraw stat K+ at the hospital and will continue to try to lower diuretics      Relevant Orders   Basic metabolic panel   Magnesium   Weakness    Refer to neurologist      Relevant Orders   Ambulatory referral to Neurology   Visual changes   Relevant Orders   Ambulatory referral to Neurology      Follow up plan: Return in about 1 week (around 02/13/2016) for follow-up.  An after-visit summary was printed and given to the patient at [place].  Please see the patient instructions which may contain  other information and recommendations beyond what is mentioned above in the assessment and plan.

## 2016-02-07 ENCOUNTER — Encounter: Payer: Self-pay | Admitting: Family Medicine

## 2016-02-09 ENCOUNTER — Encounter: Payer: Self-pay | Admitting: Family Medicine

## 2016-02-09 ENCOUNTER — Telehealth: Payer: Self-pay | Admitting: Family Medicine

## 2016-02-09 NOTE — Telephone Encounter (Signed)
How  Long are you wanting to write her out of work for?

## 2016-02-09 NOTE — Telephone Encounter (Signed)
PT IS NEEDING UPDATE WORK EXCUSE STARTING Dec 24 2015 THRU WHEN EVER . SHE DOES NOT KNOW WHEN SHE IS TO GO BACK TO WORK IS DETERMINED BY YOU. AND NEEDS IT TO BE FAXED TO [phone]. ATT: [place] DAVIS. VASCULAR APPT WENT VERY WELL WITH NO PROBLEMS.

## 2016-02-09 NOTE — Telephone Encounter (Signed)
I'm glad to hear that her appointment went well Please write her out of work from Dec 24, 2015 through February 29, 2016, tentatively to return Mar 01, 2016; any restrictions upon returning to work will be listed at that time

## 2016-02-10 ENCOUNTER — Other Ambulatory Visit: Payer: Self-pay

## 2016-02-10 ENCOUNTER — Ambulatory Visit: Payer: Self-pay | Admitting: Cardiology

## 2016-02-10 ENCOUNTER — Telehealth: Payer: Self-pay

## 2016-02-10 NOTE — Telephone Encounter (Signed)
Letter faxed.

## 2016-02-10 NOTE — Telephone Encounter (Signed)
Patient called was seeing if her disability paperwork was finnished and faxed in?  She states you were going to fill it out over the past weekend?

## 2016-02-12 NOTE — Telephone Encounter (Signed)
Done and faxed in

## 2016-02-13 ENCOUNTER — Encounter: Payer: Self-pay | Admitting: Family Medicine

## 2016-02-13 ENCOUNTER — Other Ambulatory Visit
Admission: RE | Admit: 2016-02-13 | Discharge: 2016-02-13 | Disposition: A | Discharge status: Home / Self Care | Payer: BLUE CROSS/BLUE SHIELD | Source: Ambulatory Visit | Attending: Family Medicine | Admitting: Family Medicine

## 2016-02-13 ENCOUNTER — Ambulatory Visit (INDEPENDENT_AMBULATORY_CARE_PROVIDER_SITE_OTHER): Payer: BLUE CROSS/BLUE SHIELD | Admitting: Family Medicine

## 2016-02-13 VITALS — BP 118/88 | HR 90 | Temp 98.2°F | Resp 16 | Ht 65.0 in | Wt 191.6 lb

## 2016-02-13 DIAGNOSIS — H539 Unspecified visual disturbance: Secondary | ICD-10-CM

## 2016-02-13 DIAGNOSIS — F418 Other specified anxiety disorders: Secondary | ICD-10-CM

## 2016-02-13 DIAGNOSIS — R002 Palpitations: Secondary | ICD-10-CM

## 2016-02-13 DIAGNOSIS — E875 Hyperkalemia: Secondary | ICD-10-CM

## 2016-02-13 DIAGNOSIS — M797 Fibromyalgia: Secondary | ICD-10-CM

## 2016-02-13 DIAGNOSIS — E785 Hyperlipidemia, unspecified: Secondary | ICD-10-CM

## 2016-02-13 DIAGNOSIS — F329 Major depressive disorder, single episode, unspecified: Secondary | ICD-10-CM

## 2016-02-13 DIAGNOSIS — F419 Anxiety disorder, unspecified: Secondary | ICD-10-CM

## 2016-02-13 DIAGNOSIS — E876 Hypokalemia: Secondary | ICD-10-CM

## 2016-02-13 DIAGNOSIS — F32A Depression, unspecified: Secondary | ICD-10-CM

## 2016-02-13 DIAGNOSIS — R410 Disorientation, unspecified: Secondary | ICD-10-CM

## 2016-02-13 DIAGNOSIS — I10 Essential (primary) hypertension: Secondary | ICD-10-CM | POA: Diagnosis not present

## 2016-02-13 LAB — BASIC METABOLIC PANEL
Anion gap: 6 (ref 5–15)
BUN: 20 mg/dL (ref 6–20)
CALCIUM: 9.2 mg/dL (ref 8.9–10.3)
CO2: 29 mmol/L (ref 22–32)
CREATININE: 0.88 mg/dL (ref 0.44–1.00)
Chloride: 99 mmol/L — ABNORMAL LOW (ref 101–111)
GFR calc Af Amer: >60 mL/min (ref >60)
GLUCOSE: 107 mg/dL — AB (ref 65–99)
Potassium: 4.3 mmol/L (ref 3.5–5.1)
Sodium: 134 mmol/L — ABNORMAL LOW (ref 135–145)

## 2016-02-13 LAB — MAGNESIUM: Magnesium: 1.7 mg/dL (ref 1.7–2.4)

## 2016-02-13 MED ORDER — DULOXETINE HCL 60 MG PO CPEP: 60.0000 mg | ORAL_CAPSULE | Freq: Every day | ORAL | Status: DC

## 2016-02-13 MED ORDER — MAGNESIUM GLUCONATE 500 MG PO TABS: 500.0000 mg | ORAL_TABLET | Freq: Two times a day (BID) | ORAL | Status: DC

## 2016-02-13 MED ORDER — SPIRONOLACTONE 50 MG PO TABS: 50.0000 mg | ORAL_TABLET | Freq: Every day | ORAL | Status: DC

## 2016-02-13 MED ORDER — POTASSIUM CHLORIDE CRYS ER 10 MEQ PO TBCR: 20.0000 meq | EXTENDED_RELEASE_TABLET | Freq: Two times a day (BID) | ORAL | Status: DC

## 2016-02-13 MED ORDER — MIRTAZAPINE 15 MG PO TABS: 15.0000 mg | ORAL_TABLET | Freq: Every day | ORAL | Status: DC

## 2016-02-13 MED ORDER — FUROSEMIDE 20 MG PO TABS: ORAL_TABLET | ORAL | Status: DC

## 2016-02-13 MED ORDER — ATORVASTATIN CALCIUM 40 MG PO TABS: 20.0000 mg | ORAL_TABLET | Freq: Every day | ORAL | Status: DC

## 2016-02-13 MED ORDER — DULOXETINE HCL 20 MG PO CPEP: 20.0000 mg | ORAL_CAPSULE | Freq: Every day | ORAL | Status: DC

## 2016-02-13 NOTE — Assessment & Plan Note (Signed)
Tender over trigger points; patient has read the information about fibro dx and believes this fits her; she has seen rheum, had testing for autoimmune diseases; will dx her with probable fibromyalgia and initiate cymbalta; taper up slowly; close f/u

## 2016-02-13 NOTE — Assessment & Plan Note (Signed)
She had a head CT Feb 28th; she goes to see neurologist soon; we discussed that some of this may be stress, "fibro fog", depression and anxiety; regardless of the etiology, she is confused and forgetful enough that she cannot remember her job title, cannot drive a car, cannot write checks, etc.; out of work for the time-being

## 2016-02-13 NOTE — Assessment & Plan Note (Signed)
Going to see neuro soon, resolved since stopping eye drops

## 2016-02-13 NOTE — Progress Notes (Signed)
BP 118/88 mmHg  Pulse 90  Temp(Src) 98.2 F (36.8 C) (Oral)  Resp 16  Ht 5\' 5"  (1.651 m)  Wt 191 lb 9.6 oz (86.909 kg)  BMI 31.88 kg/m2  SpO2 96%   Subjective:    Patient ID: Laura [name], female    DOB: Jul 21, 1966, 50 y.o.   MRN: [id number]  HPI: Laura [name] is a 50 y.o. female  Chief Complaint  Patient presents with  . Follow-up    patient is here for her 1 week f/u. she stated that she hurts everywhere. she also stated that she has some confusion. patient stated that she figured out that the eye drops was causing the vision problems.   She has been seeing blue hues around things since starting some new eye drops prescribed at the ER; she moved her arms and saw like 25 arms following it; that lasted a few hours; thought it was the eye drops, so she stopped the eye drops and the weird visual symptoms have resolved; no major headaches associated with it  She has stopped the trazodone because of difficulty urinating; she has had a major problem sleeping for years; not sleeping well; trouble falling asleep and staying asleep; awake again in 2 hours  She is having a little swelling in her legs, but not as bad; hands are swelling; rings are tight, usually they were floppy  Still having heart palpitations, but not as frequently; not sure if potassium or stress or anxiety; she goes to see the cardiologist next week  This whole set of symptoms and episode has been "horrible", very stressful; she has been unable to work; can't concentrate; can't drive; doesn't even feel like she can manage her house; doesn't even feel confident writing checks, doing budget, managing money; this is all new; crummy short-term memory; not having the sensation of wanting to faint as much; drinking water  She read the websites about fibromyalgia; she has felt like this for years off an on; every place that hurts now has hurt in the past; she read about it and thinks it is spot on; she was reading that stuff  and it was looking in the mirror; very tender in many of the trigger locations she saw on-line; terrible pain over the SI joints; pain over the bases of the thumbs; trouble even holdings glasses; she saw the rheumatologist and she had all the blood work to look for RA and she doesn't  Does get panic attacks, uses xanax PRN, no red flags per patient; clonazepam did not work for her, did not help; she does not think there is any issue with her using xanax; used to have a drug problem in her early 72s, but that is all resolved, years ago, water under the bridge, and the xanax is not a trigger or making her think of relapse potential   Depression screen [facility] 2/9 02/13/2016 02/02/2016 11/19/2015 06/06/2015  Decreased Interest 0 0 0 2  Down, Depressed, Hopeless 1 1 0 3  PHQ - 2 Score 1 1 0 5  Altered sleeping 3 - - 1  Tired, decreased energy 3 - - 1  Change in appetite 0 - - 0  Feeling bad or failure about yourself  0 - - 1  Trouble concentrating 1 - - 2  Moving slowly or fidgety/restless 0 - - 0  Suicidal thoughts 0 - - 0  PHQ-9 Score 8 - - 10  Difficult doing work/chores Very difficult - - Somewhat difficult  Relevant past medical, surgical, family and social history reviewed and updated as indicated. Interim medical history since our last visit reviewed. Allergies and medications reviewed and updated.  Review of Systems Per HPI unless specifically indicated above     Objective:    BP 118/88 mmHg  Pulse 90  Temp(Src) 98.2 F (36.8 C) (Oral)  Resp 16  Ht 5\' 5"  (1.651 m)  Wt 191 lb 9.6 oz (86.909 kg)  BMI 31.88 kg/m2  SpO2 96%  Wt Readings from Last 3 Encounters:  02/13/16 191 lb 9.6 oz (86.909 kg)  02/06/16 191 lb (86.637 kg)  02/02/16 191 lb 6.4 oz (86.818 kg)    Physical Exam  Constitutional: She appears well-developed and well-nourished. No distress.  Weight is stable  HENT:  Head: Normocephalic and atraumatic.  Eyes: EOM are normal. No scleral icterus.  Neck: No JVD present.    Musculoskeletal: She exhibits no edema (no pitting pretibial edema).  Very tender to palpation under the medial right clavicle and right lateral epicondyle  Neurological: She is alert.  Psychiatric: Her mood appears anxious. Her affect is not blunt, not labile and not inappropriate. Her speech is not slurred. She is not slowed and not withdrawn. Cognition and memory are impaired (patient was not able to remember the name of her job title at work). She does not express impulsivity or inappropriate judgment. She exhibits a depressed mood (briefly tearful; laid on the exam table during some of the visit, but had good overall eye contact with examiner). She expresses no homicidal and no suicidal ideation.   Results for orders placed or performed during the hospital encounter of 0000000  Basic metabolic panel  Result Value Ref Range   Sodium 135 135 - 145 mmol/L   Potassium 3.3 (L) 3.5 - 5.1 mmol/L   Chloride 98 (L) 101 - 111 mmol/L   CO2 30 22 - 32 mmol/L   Glucose, Bld 104 (H) 65 - 99 mg/dL   BUN 13 6 - 20 mg/dL   Creatinine, Ser 0.83 0.44 - 1.00 mg/dL   Calcium 8.8 (L) 8.9 - 10.3 mg/dL   GFR calc non Af Amer >60 >60 mL/min   GFR calc Af Amer >60 >60 mL/min   Anion gap 7 5 - 15  Magnesium  Result Value Ref Range   Magnesium 1.8 1.7 - 2.4 mg/dL      Assessment & Plan:   Problem List Items Addressed This Visit      Cardiovascular and Mediastinum   Hypertension goal BP (blood pressure) < 140/90    Controlled today; will be checking K+ today and continue to wean down the diuretics that her previous doctor had her on      Relevant Medications   atorvastatin (LIPITOR) 40 MG tablet     Nervous and Auditory   Confusion    She had a head CT Feb 28th; she goes to see neurologist soon; we discussed that some of this may be stress, "fibro fog", depression and anxiety; regardless of the etiology, she is confused and forgetful enough that she cannot remember her job title, cannot drive a car,  cannot write checks, etc.; out of work for the time-being        Musculoskeletal and Integument   Fibromyalgia    Tender over trigger points; patient has read the information about fibro dx and believes this fits her; she has seen rheum, had testing for autoimmune diseases; will dx her with probable fibromyalgia and initiate cymbalta; taper up slowly;  close f/u        Other   Anxiety and depression    Will add cymbalta, and slowly taper up, starting with 20 mg daily x 10 days, then 40 mg daily x 10 days, then 60 mg daily; this should also treat fibro (if indeed that's what she has); close f/u      Hypokalemia - Primary    Check K+ today; will continue to wean down the high doses of diuretics that her previous provider had her on      Relevant Orders   Basic metabolic panel   Hyperlipidemia LDL goal <100    With her aches, will drop her statin dose by 50% and reassess at f/u      Relevant Medications   atorvastatin (LIPITOR) 40 MG tablet   Heart palpitations    Still occurring, less frequently; check K+ and Mg2+ today; she sees cardiologist next week      Hypomagnesemia syndrome    Check Mg2+; increase dietary intake, may take 3-4 a day if needed of supplement      Relevant Medications   magnesium gluconate (MAGONATE) 500 MG tablet   Other Relevant Orders   Magnesium   Visual changes    Going to see neuro soon, resolved since stopping eye drops         Follow up plan: Return in about 2 weeks (around 02/27/2016) for follow-up.  Form completed for work; see copy on EMR  An after-visit summary was printed and given to the patient at [place].  Please see the patient instructions which may contain other information and recommendations beyond what is mentioned above in the assessment and plan.  Meds ordered this encounter  Medications  . DULoxetine (CYMBALTA) 60 MG capsule    Sig: Take 1 capsule (60 mg total) by mouth daily. After tapering up    Dispense:  30 capsule     Refill:  0  . DULoxetine (CYMBALTA) 20 MG capsule    Sig: Take 1 capsule (20 mg total) by mouth daily. For 10 days, then 2 capsules x 10 days, then 60 mg strength    Dispense:  30 capsule    Refill:  0  . mirtazapine (REMERON) 15 MG tablet    Sig: Take 1 tablet (15 mg total) by mouth at bedtime.    Dispense:  30 tablet    Refill:  0  . magnesium gluconate (MAGONATE) 500 MG tablet    Sig: Take 1 tablet (500 mg total) by mouth 2 (two) times daily.    Dispense:  60 tablet    Refill:  0  . atorvastatin (LIPITOR) 40 MG tablet    Sig: Take 0.5 tablets (20 mg total) by mouth daily.    Dispense:  45 tablet    Refill:  0   Orders Placed This Encounter  Procedures  . Basic metabolic panel  . Magnesium

## 2016-02-13 NOTE — Assessment & Plan Note (Signed)
Check Mg2+; increase dietary intake, may take 3-4 a day if needed of supplement

## 2016-02-13 NOTE — Telephone Encounter (Signed)
Changing medications; trying to wean her off of significant diuretics from previous provider; close f/u, lab monitoring

## 2016-02-13 NOTE — Assessment & Plan Note (Addendum)
Check K+ today; will continue to wean down the high doses of diuretics that her previous provider had her on

## 2016-02-13 NOTE — Assessment & Plan Note (Signed)
Still occurring, less frequently; check K+ and Mg2+ today; she sees cardiologist next week

## 2016-02-13 NOTE — Assessment & Plan Note (Signed)
Will add cymbalta, and slowly taper up, starting with 20 mg daily x 10 days, then 40 mg daily x 10 days, then 60 mg daily; this should also treat fibro (if indeed that's what she has); close f/u

## 2016-02-13 NOTE — Assessment & Plan Note (Signed)
Controlled today; will be checking K+ today and continue to wean down the diuretics that her previous doctor had her on

## 2016-02-13 NOTE — Assessment & Plan Note (Signed)
With her aches, will drop her statin dose by 50% and reassess at f/u

## 2016-02-13 NOTE — Patient Instructions (Addendum)
Start the cymbalta (duloxetine) and taper up gradually over the next 3 weeks Start the remeron (mirtazipine) at bedtime; if too strong, okay to take half of a pill Let's get you to the neurologist and the cardiologist Let's check the potassium today and we'll keep tapering your diuretics down Decrease the atorvastatin to 20 mg at bedtime You have my blessing to take magnesium twice a day a day, or just increase your dietary intake of magnesium Fish oil twice a day if you can (put in the frig to help limit burping and fishy taste) Return in 2 weeks

## 2016-02-18 ENCOUNTER — Other Ambulatory Visit
Admission: RE | Admit: 2016-02-18 | Discharge: 2016-02-18 | Disposition: A | Discharge status: Home / Self Care | Payer: BLUE CROSS/BLUE SHIELD | Source: Ambulatory Visit | Attending: Family Medicine | Admitting: Family Medicine

## 2016-02-18 ENCOUNTER — Other Ambulatory Visit: Payer: Self-pay | Admitting: Family Medicine

## 2016-02-18 ENCOUNTER — Telehealth: Payer: Self-pay | Admitting: Family Medicine

## 2016-02-18 DIAGNOSIS — E875 Hyperkalemia: Secondary | ICD-10-CM

## 2016-02-18 LAB — POTASSIUM: Potassium: 4.2 mmol/L (ref 3.5–5.1)

## 2016-02-18 NOTE — Telephone Encounter (Signed)
I am so pleased to see that the K+ is normal Let's leave the dose alone, she'll see cardiologist tomorrow If no indication for such high doses of diuretics, we'll continue to taper I spoke with patient; she will see me next Friday If sx of electrolyte disturbance before then, I'm happy to co-sign order for K+, Dx: E87.6 She thinks the cymbalta is already starting to work Mirtazipine gave her a bad reaction; swelling in the face, so she stopped it

## 2016-02-19 ENCOUNTER — Encounter: Payer: Self-pay | Admitting: Cardiology

## 2016-02-19 ENCOUNTER — Ambulatory Visit (INDEPENDENT_AMBULATORY_CARE_PROVIDER_SITE_OTHER): Payer: BLUE CROSS/BLUE SHIELD | Admitting: Cardiology

## 2016-02-19 ENCOUNTER — Encounter: Payer: Self-pay | Admitting: Family Medicine

## 2016-02-19 VITALS — BP 136/94 | HR 71 | Ht 65.0 in | Wt 189.5 lb

## 2016-02-19 DIAGNOSIS — R002 Palpitations: Secondary | ICD-10-CM

## 2016-02-19 DIAGNOSIS — R609 Edema, unspecified: Secondary | ICD-10-CM

## 2016-02-19 DIAGNOSIS — R079 Chest pain, unspecified: Secondary | ICD-10-CM

## 2016-02-19 DIAGNOSIS — R0602 Shortness of breath: Secondary | ICD-10-CM | POA: Diagnosis not present

## 2016-02-19 NOTE — Patient Instructions (Addendum)
Medication Instructions:  Your physician recommends that you continue on your current medications as directed. Please refer to the Current Medication list given to you today.   Labwork: Labs today are BNP  Testing/Procedures: Your physician has requested that you have an echocardiogram. Echocardiography is a painless test that uses sound waves to create images of your heart. It provides your doctor with information about the size and shape of your heart and how well your heart's chambers and valves are working. This procedure takes approximately one hour. There are no restrictions for this procedure.  Date & Time: ______________________________________________________  Your physician has recommended that you wear a holter monitor. Holter monitors are medical devices that record the heart's electrical activity. Doctors most often use these monitors to diagnose arrhythmias. Arrhythmias are problems with the speed or rhythm of the heartbeat. The monitor is a small, portable device. You can wear one while you do your normal daily activities. This is usually used to diagnose what is causing palpitations/syncope (passing out).  Date & Time:_________________________________________________________  [place]  Your caregiver has ordered a Stress Test with nuclear imaging. The purpose of this test is to evaluate the blood supply to your heart muscle. This procedure is referred to as a "Non-Invasive Stress Test." This is because other than having an IV started in your vein, nothing is inserted or "invades" your body. Cardiac stress tests are done to find areas of poor blood flow to the heart by determining the extent of coronary artery disease (CAD). Some patients exercise on a treadmill, which naturally increases the blood flow to your heart, while others who are  unable to walk on a treadmill due to physical limitations have a pharmacologic/chemical stress agent called Lexiscan . This medicine will  mimic walking on a treadmill by temporarily increasing your coronary blood flow.   Please note: these test may take anywhere between 2-4 hours to complete  PLEASE REPORT TO [facility] AT THE FIRST DESK WILL DIRECT YOU WHERE TO GO  Date of Procedure:____Thursday February 26, 2016 at 07:30AM___________  Arrival Time for Procedure:_____Arrive at 07:15AM____________  Instructions regarding medication:   _X___ : Hold diabetes medication morning of procedure     PLEASE NOTIFY THE OFFICE AT LEAST 24 HOURS IN ADVANCE IF YOU ARE UNABLE TO KEEP YOUR APPOINTMENT.  [phone] AND  PLEASE NOTIFY NUCLEAR MEDICINE AT [facility] AT LEAST 24 HOURS IN ADVANCE IF YOU ARE UNABLE TO KEEP YOUR APPOINTMENT. [phone]  How to prepare for your Myoview test:   Do not eat or drink after midnight  No caffeine for 24 hours prior to test  No smoking 24 hours prior to test.  Your medication may be taken with water.  If your doctor stopped a medication because of this test, do not take that medication.  Ladies, please do not wear dresses.  Skirts or pants are appropriate. Please wear a short sleeve shirt.  No perfume, cologne or lotion.  Wear comfortable walking shoes. No heels!            Follow-Up: Your physician recommends that you schedule a follow-up appointment after testing to review results.  Date & Time:_________________________________________________________________   Any Other Special Instructions Will Be Listed Below (If Applicable).     If you need a refill on your cardiac medications before your next appointment, please call your pharmacy.  Echocardiogram An echocardiogram, or echocardiography, uses sound waves (ultrasound) to produce an image of your heart. The echocardiogram is simple, painless,  obtained within a short period of time, and offers valuable information to your health care provider. The images from an echocardiogram can provide  information such as:  Evidence of coronary artery disease (CAD).  Heart size.  Heart muscle function.  Heart valve function.  Aneurysm detection.  Evidence of a past heart attack.  Fluid buildup around the heart.  Heart muscle thickening.  Assess heart valve function. LET [facility] CARE PROVIDER KNOW ABOUT:  Any allergies you have.  All medicines you are taking, including vitamins, herbs, eye drops, creams, and over-the-counter medicines.  Previous problems you or members of your family have had with the use of anesthetics.  Any blood disorders you have.  Previous surgeries you have had.  Medical conditions you have.  Possibility of pregnancy, if this applies. BEFORE THE PROCEDURE  No special preparation is needed. Eat and drink normally.  PROCEDURE   In order to produce an image of your heart, gel will be applied to your chest and a wand-like tool (transducer) will be moved over your chest. The gel will help transmit the sound waves from the transducer. The sound waves will harmlessly bounce off your heart to allow the heart images to be captured in real-time motion. These images will then be recorded.  You may need an IV to receive a medicine that improves the quality of the pictures. AFTER THE PROCEDURE You may return to your normal schedule including diet, activities, and medicines, unless your health care provider tells you otherwise.   This information is not intended to replace advice given to you by your health care provider. Make sure you discuss any questions you have with your health care provider.   Document Released: 10/15/2000 Document Revised: 11/08/2014 Document Reviewed: 06/25/2013 Elsevier Interactive Patient Education 2016 Elsevier Inc.   Holter Monitoring A Holter monitor is a small device that is used to detect abnormal heart rhythms. It clips to your clothing and is connected by wires to flat, sticky disks (electrodes) that attach to your  chest. It is worn continuously for 24-48 hours. HOME CARE INSTRUCTIONS  Wear your Holter monitor at all times, even while exercising and sleeping, for as long as directed by your health care provider.  Make sure that the Holter monitor is safely clipped to your clothing or close to your body as recommended by your health care provider.  Do not get the monitor or wires wet.  Do not put body lotion or moisturizer on your chest.  Keep your skin clean.  Keep a diary of your daily activities, such as walking and doing chores. If you feel that your heartbeat is abnormal or that your heart is fluttering or skipping a beat:  Record what you are doing when it happens.  Record what time of day the symptoms occur.  Return your Holter monitor as directed by your health care provider.  Keep all follow-up visits as directed by your health care provider. This is important. SEEK IMMEDIATE MEDICAL CARE IF:  You feel lightheaded or you faint.  You have trouble breathing.  You feel pain in your chest, upper arm, or jaw.  You feel sick to your stomach and your skin is pale, cool, or damp.  You heartbeat feels unusual or abnormal.   This information is not intended to replace advice given to you by your health care provider. Make sure you discuss any questions you have with your health care provider.   Document Released: 07/16/2004 Document Revised: 11/08/2014 Document Reviewed: 05/27/2014 Elsevier  Interactive Patient Education 2016 [employer].   Cardiac Nuclear Scanning A cardiac nuclear scan is used to check your heart for problems, such as the following:  A portion of the heart is not getting enough blood.  Part of the heart muscle has died, which happens with a heart attack.  The heart wall is not working normally.  In this test, a radioactive dye (tracer) is injected into your bloodstream. After the tracer has traveled to your heart, a scanning device is used to measure how much  of the tracer is absorbed by or distributed to various areas of your heart. LET [facility] CARE PROVIDER KNOW ABOUT:  Any allergies you have.  All medicines you are taking, including vitamins, herbs, eye drops, creams, and over-the-counter medicines.  Previous problems you or members of your family have had with the use of anesthetics.  Any blood disorders you have.  Previous surgeries you have had.  Medical conditions you have.  RISKS AND COMPLICATIONS Generally, this is a safe procedure. However, as with any procedure, problems can occur. Possible problems include:   Serious chest pain.  Rapid heartbeat.  Sensation of warmth in your chest. This usually passes quickly. BEFORE THE PROCEDURE Ask your health care provider about changing or stopping your regular medicines. PROCEDURE This procedure is usually done at a hospital and takes 2-4 hours.  An IV tube is inserted into one of your veins.  Your health care provider will inject a small amount of radioactive tracer through the tube.  You will then wait for 20-40 minutes while the tracer travels through your bloodstream.  You will lie down on an exam table so images of your heart can be taken. Images will be taken for about 15-20 minutes.  You will exercise on a treadmill or stationary bike. While you exercise, your heart activity will be monitored with an electrocardiogram (ECG), and your blood pressure will be checked.  If you are unable to exercise, you may be given a medicine to make your heart beat faster.  When blood flow to your heart has peaked, tracer will again be injected through the IV tube.  After 20-40 minutes, you will get back on the exam table and have more images taken of your heart.  When the procedure is over, your IV tube will be removed. AFTER THE PROCEDURE  You will likely be able to leave shortly after the test. Unless your health care provider tells you otherwise, you may return to your normal  schedule, including diet, activities, and medicines.  Make sure you find out how and when you will get your test results.   This information is not intended to replace advice given to you by your health care provider. Make sure you discuss any questions you have with your health care provider.   Document Released: 11/12/2004 Document Revised: 10/23/2013 Document Reviewed: 09/26/2013 Elsevier Interactive Patient Education [employer].

## 2016-02-19 NOTE — Progress Notes (Signed)
Cardiology Office Note   Date:  02/19/2016   ID:  Laura [name], DOB 1966-07-05, MRN [id number]  Referring Doctor:  Enid Derry, MD   Cardiologist:   Wende Bushy, MD   Reason for consultation:  Chief Complaint  Patient presents with  . other     C/o Palpitations and edema ankles/feet. Meds reviewed verbally with pt.      History of Present Illness: Laura [name] is a 50 y.o. female who presents for Evaluation of palpitations, shortness of breath, swelling.  In terms of palpitations, this has been going on for quite some time now. She describes it as fluttering in the chest. This curse likely on a daily basis. Duration varies but the most would be  1-1/2 hours. Randomly occurring. Symptoms mainly in the chest, nonradiating. Associated with some chest tightness. At the worse she describes it as 10 out of 10 in severity.  In terms of the shortness of breath, she experiences this with the palpitations. Also, she reports dyspnea with exertion. In the past, she was able to walk approximately 7 miles almost every day. Beginning of this year, when she had issues with low potassium levels, she started noticing more shortness of breath. She just feels very tired and unable to continue with her usual physical activity.  In terms of swelling, she describes swelling of her feet more on the right foot. She also reports episodes with "generalized swelling" to include the abdomen and her hands. She's had this for years. Other family members also has a same condition. She's gone to several doctors, including a nephrologist. She is still not sure why she continues to be dependent on Lasix. She is being workup by her regular doctor and a nephrologist for this. Otherwise, patient denies headache, fever, cough, colds, abdominal pain, orthopnea, PND.  ROS:  Please see the history of present illness. Aside from mentioned under HPI, all other systems are reviewed and negative.    Past Medical History    Diagnosis Date  . Vaginal enterocele   . High cholesterol   . GERD (gastroesophageal reflux disease)   . Exposure to hepatitis C   . Bacterial vaginitis   . Yeast infection   . Kidney disease   . Urinary retention with incomplete bladder emptying   . Vaginal dryness, menopausal   . Increased BMI   . Hypokalemia   . Rectocele   . Anxiety and depression   . Cystocele   . Elevated fasting blood sugar   . Hypertension     Past Surgical History  Procedure Laterality Date  . Thumb surgery    . Partial hysterectomy    . Appendectomy       reports that she has quit smoking. She does not have any smokeless tobacco history on file. She reports that she drinks alcohol. She reports that she does not use illicit drugs.   family history includes Stroke in her father.   Current Outpatient Prescriptions  Medication Sig Dispense Refill  . albuterol (PROVENTIL HFA;VENTOLIN HFA) 108 (90 Base) MCG/ACT inhaler Inhale 2 puffs into the lungs every 4 (four) hours as needed for wheezing or shortness of breath. 1 Inhaler 1  . ALPRAZolam (XANAX) 1 MG tablet Take 1 mg by mouth 4 (four) times daily.    Marland Kitchen amphetamine-dextroamphetamine (ADDERALL) 20 MG tablet   0  . atorvastatin (LIPITOR) 40 MG tablet Take 0.5 tablets (20 mg total) by mouth daily. 45 tablet 0  . buPROPion (WELLBUTRIN XL) 150  MG 24 hr tablet   0  . diclofenac sodium (VOLTAREN) 1 % GEL Apply topically 2 (two) times daily. Reported on 02/19/2016    . DULoxetine (CYMBALTA) 20 MG capsule Take 1 capsule (20 mg total) by mouth daily. For 10 days, then 2 capsules x 10 days, then 60 mg strength 30 capsule 0  . EPINEPHRINE 0.3 mg/0.3 mL IJ SOAJ injection Inject 0.3 mLs (0.3 mg total) into the muscle once. 2 Device 1  . furosemide (LASIX) 20 MG tablet Two tablets (40 mg) every morning, two tablets (40 mg) every afternoon - new instructions 1 tablet 0  . glucose blood (ACCU-CHEK SMARTVIEW) test strip Use as instructed to check blood glucose once a  day and prn 50 each 1  . Insulin Pen Needle 32G X 6 MM MISC 1 Device by Does not apply route daily. 50 each 1  . levothyroxine (SYNTHROID, LEVOTHROID) 125 MCG tablet TAKE ONE TABLET BY MOUTH ONCE DAILY 90 tablet 1  . magnesium gluconate (MAGONATE) 500 MG tablet Take 1 tablet (500 mg total) by mouth 2 (two) times daily. 60 tablet 0  . metroNIDAZOLE (METROGEL) 0.75 % vaginal gel Place 1 Applicatorful vaginally daily as needed. Patient name previously Iyesha Clary 70 g 2  . montelukast (SINGULAIR) 10 MG tablet Take 1 tablet (10 mg total) by mouth at bedtime. For allergies and asthma 90 tablet 3  . pantoprazole (PROTONIX) 40 MG tablet Take 1 tablet (40 mg total) by mouth daily. 90 tablet 2  . potassium chloride (K-DUR,KLOR-CON) 10 MEQ tablet Take 2 tablets (20 mEq total) by mouth 2 (two) times daily. 1 tablet 0  . spironolactone (ALDACTONE) 50 MG tablet Take 1 tablet (50 mg total) by mouth daily. 1 tablet 0  . tiZANidine (ZANAFLEX) 4 MG capsule Take 4 mg by mouth 3 (three) times daily as needed.    . valsartan (DIOVAN) 160 MG tablet Take 1 tablet (160 mg total) by mouth daily. 90 tablet 3  . Vitamin D, Ergocalciferol, (DRISDOL) 50000 units CAPS capsule TAKE ONE CAPSULE BY MOUTH ONCE A WEEK 12 capsule 0  . DULoxetine (CYMBALTA) 60 MG capsule Take 1 capsule (60 mg total) by mouth daily. After tapering up (Patient not taking: Reported on 02/19/2016) 30 capsule 0   No current facility-administered medications for this visit.    Allergies: Iodine; Meperidine; Shellfish allergy; and Mirtazapine    PHYSICAL EXAM: VS:  BP 136/94 mmHg  Pulse 71  Ht 5\' 5"  (1.651 m)  Wt 189 lb 8 oz (85.957 kg)  BMI 31.53 kg/m2 , Body mass index is 31.53 kg/(m^2). Wt Readings from Last 3 Encounters:  02/19/16 189 lb 8 oz (85.957 kg)  02/13/16 191 lb 9.6 oz (86.909 kg)  02/06/16 191 lb (86.637 kg)    GENERAL:  well developed, well nourished, obese, not in acute distress HEENT: normocephalic, pink conjunctivae,  anicteric sclerae, no xanthelasma, normal dentition, oropharynx clear NECK:  no neck vein engorgement, JVP normal, no hepatojugular reflux, carotid upstroke brisk and symmetric, no bruit, no thyromegaly, no lymphadenopathy LUNGS:  good respiratory effort, clear to auscultation bilaterally CV:  PMI not displaced, no thrills, no lifts, S1 and S2 within normal limits, no palpable S3 or S4, no murmurs, no rubs, no gallops ABD:  Soft, nontender, nondistended, normoactive bowel sounds, no abdominal aortic bruit, no hepatomegaly, no splenomegaly MS: nontender back, no kyphosis, no scoliosis, no joint deformities EXT:  2+ DP/PT pulses, Nonpitting edema over the dorsum of right foot, no varicosities, no cyanosis, no  clubbing SKIN: warm, nondiaphoretic, normal turgor, no ulcers NEUROPSYCH: alert, oriented to person, place, and time, sensory/motor grossly intact, normal mood, appropriate affect  Recent Labs: 12/16/2015: TSH 3.060 02/03/2016: ALT 19; Hemoglobin 12.9; Platelets 338 02/13/2016: BUN 20; Creatinine, Ser 0.88; Magnesium 1.7; Sodium 134* 02/18/2016: Potassium 4.2   Lipid Panel    Component Value Date/Time   CHOL 238* 11/24/2015 0826   TRIG 319* 11/24/2015 0826   HDL 50 11/24/2015 0826   CHOLHDL 4.8* 11/24/2015 0826   LDLCALC 124* 11/24/2015 0826     Other studies Reviewed:  EKG:  The ekg from 02/19/2016 was personally reviewed by me and it revealed sinus rhythm, 71 BPM, low voltage QRS.  Additional studies/ records that were reviewed personally reviewed by me today include: None available   ASSESSMENT AND PLAN:  Palpitations, associated with chest tightness Recommend objective evaluation with 24-hour Holter monitor. Recommend echocardiogram.  Shortness of breath with exertion Risk factors for coronary artery disease include hypertension, postmenopausal state, hyperlipidemia. Recommend exercise nuclear stress test.  Swelling Nonpitting edema over right foot No other evidence of  congestive heart failure on physical exam Echocardiogram has been ordered. Recommend to check BNP. Recommend sodium restriction.  Obesity Body mass index is 31.53 kg/(m^2).Marland Kitchen Recommend aggressive weight loss through diet and increased physical activity, once cardiac workup has been done.    Current medicines are reviewed at length with the patient today.  The patient does not have concerns regarding medicines.  Labs/ tests ordered today include:  Orders Placed This Encounter  Procedures  . NM Myocar Multi W/Spect W/Wall Motion / EF  . B Nat Peptide  . Holter monitor - 24 hour  . EKG 12-Lead  . Echocardiogram    I had a lengthy and detailed discussion with the patient regarding diagnoses, prognosis, diagnostic options, treatment options .   I counseled the patient on importance of lifestyle modification including heart healthy diet, regular physical activity .   Disposition:   FU with undersigned after tests   Signed, Wende Bushy, MD  02/19/2016 10:33 AM    [place]

## 2016-02-20 LAB — BRAIN NATRIURETIC PEPTIDE: BNP: 4.8 pg/mL (ref 0.0–100.0)

## 2016-02-25 ENCOUNTER — Telehealth: Payer: Self-pay | Admitting: Cardiology

## 2016-02-25 NOTE — Telephone Encounter (Signed)
Spoke with patient and confirmed exercise myoview scheduled for tomorrow at 07:30AM. Patient verbalized understanding of instructions, time, and location of test with no further questions at this time.

## 2016-02-26 ENCOUNTER — Encounter
Admission: RE | Admit: 2016-02-26 | Discharge: 2016-02-26 | Disposition: A | Discharge status: Home / Self Care | Payer: BLUE CROSS/BLUE SHIELD | Source: Ambulatory Visit | Attending: Cardiology | Admitting: Cardiology

## 2016-02-26 DIAGNOSIS — R079 Chest pain, unspecified: Secondary | ICD-10-CM

## 2016-02-26 DIAGNOSIS — R609 Edema, unspecified: Secondary | ICD-10-CM | POA: Diagnosis not present

## 2016-02-26 DIAGNOSIS — R002 Palpitations: Secondary | ICD-10-CM | POA: Diagnosis not present

## 2016-02-26 DIAGNOSIS — R0602 Shortness of breath: Secondary | ICD-10-CM

## 2016-02-26 LAB — NM MYOCAR MULTI W/SPECT W/WALL MOTION / EF
CHL CUP NUCLEAR SDS: 0
CHL CUP NUCLEAR SRS: 0
CHL CUP NUCLEAR SSS: 0
CHL CUP STRESS STAGE 1 GRADE: 0 %
CHL CUP STRESS STAGE 2 HR: 121 {beats}/min
CHL CUP STRESS STAGE 3 DBP: 78 mmHg
CHL CUP STRESS STAGE 3 GRADE: 12 %
CHL CUP STRESS STAGE 4 HR: 150 {beats}/min
CHL CUP STRESS STAGE 5 HR: 136 {beats}/min
CHL CUP STRESS STAGE 5 SPEED: 0 mph
CHL CUP STRESS STAGE 6 DBP: 78 mmHg
CHL CUP STRESS STAGE 6 GRADE: 0 %
CSEPPMHR: 88 %
Estimated workload: 8.5 METS
Exercise duration (min): 7 min
LV sys vol: 19 mL
LVDIAVOL: 62 mL (ref 46–106)
Peak HR: 150 {beats}/min
Percent HR: 88 %
Rest HR: 76 {beats}/min
Stage 1 HR: 100 {beats}/min
Stage 1 Speed: 0 mph
Stage 2 DBP: 72 mmHg
Stage 2 Grade: 10 %
Stage 2 SBP: 152 mmHg
Stage 2 Speed: 1.7 mph
Stage 3 HR: 146 {beats}/min
Stage 3 SBP: 171 mmHg
Stage 3 Speed: 2.5 mph
Stage 4 Grade: 14 %
Stage 4 Speed: 3.4 mph
Stage 5 Grade: 0 %
Stage 6 HR: 98 {beats}/min
Stage 6 SBP: 134 mmHg
Stage 6 Speed: 0 mph
TID: 1.03

## 2016-02-26 MED ORDER — TECHNETIUM TC 99M SESTAMIBI - CARDIOLITE
13.2600 | Freq: Once | INTRAVENOUS | Status: AC | PRN
  Administered 2016-02-26: 13.26 via INTRAVENOUS

## 2016-02-26 MED ORDER — TECHNETIUM TC 99M SESTAMIBI - CARDIOLITE
30.3900 | Freq: Once | INTRAVENOUS | Status: AC | PRN
  Administered 2016-02-26: 10:00:00 30.39 via INTRAVENOUS

## 2016-03-02 ENCOUNTER — Encounter: Payer: Self-pay | Admitting: Family Medicine

## 2016-03-02 ENCOUNTER — Other Ambulatory Visit: Payer: Self-pay | Admitting: Family Medicine

## 2016-03-02 ENCOUNTER — Other Ambulatory Visit
Admission: RE | Admit: 2016-03-02 | Discharge: 2016-03-02 | Disposition: A | Discharge status: Home / Self Care | Payer: BLUE CROSS/BLUE SHIELD | Source: Ambulatory Visit | Attending: Family Medicine | Admitting: Family Medicine

## 2016-03-02 ENCOUNTER — Ambulatory Visit (INDEPENDENT_AMBULATORY_CARE_PROVIDER_SITE_OTHER): Payer: BLUE CROSS/BLUE SHIELD | Admitting: Family Medicine

## 2016-03-02 VITALS — BP 122/78 | HR 97 | Temp 98.2°F | Resp 16 | Wt 191.0 lb

## 2016-03-02 DIAGNOSIS — M791 Myalgia, unspecified site: Secondary | ICD-10-CM

## 2016-03-02 DIAGNOSIS — E559 Vitamin D deficiency, unspecified: Secondary | ICD-10-CM | POA: Diagnosis not present

## 2016-03-02 DIAGNOSIS — E785 Hyperlipidemia, unspecified: Secondary | ICD-10-CM | POA: Diagnosis not present

## 2016-03-02 DIAGNOSIS — E876 Hypokalemia: Secondary | ICD-10-CM

## 2016-03-02 DIAGNOSIS — R002 Palpitations: Secondary | ICD-10-CM

## 2016-03-02 DIAGNOSIS — M797 Fibromyalgia: Secondary | ICD-10-CM

## 2016-03-02 DIAGNOSIS — M7918 Myalgia, other site: Secondary | ICD-10-CM | POA: Insufficient documentation

## 2016-03-02 LAB — BASIC METABOLIC PANEL
Anion gap: 9 (ref 5–15)
BUN: 11 mg/dL (ref 6–20)
CALCIUM: 9.7 mg/dL (ref 8.9–10.3)
CO2: 28 mmol/L (ref 22–32)
CREATININE: 0.86 mg/dL (ref 0.44–1.00)
Chloride: 102 mmol/L (ref 101–111)
Glucose, Bld: 130 mg/dL — ABNORMAL HIGH (ref 65–99)
Potassium: 3.5 mmol/L (ref 3.5–5.1)
Sodium: 139 mmol/L (ref 135–145)

## 2016-03-02 LAB — CK: Total CK: 44 U/L (ref 38–234)

## 2016-03-02 LAB — MAGNESIUM: MAGNESIUM: 1.8 mg/dL (ref 1.7–2.4)

## 2016-03-02 MED ORDER — DULOXETINE HCL 60 MG PO CPEP: 60.0000 mg | ORAL_CAPSULE | Freq: Every day | ORAL | Status: DC

## 2016-03-02 MED ORDER — FUROSEMIDE 20 MG PO TABS: ORAL_TABLET | ORAL | Status: DC

## 2016-03-02 MED ORDER — MAGNESIUM OXIDE 400 MG PO TABS: 400.0000 mg | ORAL_TABLET | Freq: Two times a day (BID) | ORAL | Status: DC

## 2016-03-02 NOTE — Progress Notes (Signed)
BP 122/78 mmHg  Pulse 97  Temp(Src) 98.2 F (36.8 C) (Oral)  Resp 16  Wt 191 lb (86.637 kg)  SpO2 96%   Subjective:    Patient ID: Laura [name], female    DOB: August 11, 1966, 50 y.o.   MRN: [id number]  HPI: [name] is a 50 y.o. female  Chief Complaint  Patient presents with  . Follow-up    2 weeks   She is here for f/u Had a rough night of very poor sleep; hurting everywhere, exhausted and fatigued She has seen the cardiologist; had the stress test last week; she had a hard time with breathing and had double heart beats that they watched; she didn't understand the test results on MyChart We reviewed the stress test in detail NM Myocar Multi W/Spect W/Wall Motion / EF   Status: Final result       PACS Images     Show images for NM Myocar Multi W/Spect W/Wall Motion / EF     Study Result      Blood pressure demonstrated a hypertensive response to exercise. Frequent PVCs with exercise.  There was no ST segment deviation noted during stress.  No T wave inversion was noted during stress.  The study is normal.  This is a low risk study.  The left ventricular ejection fraction is normal (55-65%).   The cardiologist told her that she doesn't need diuretics; they are going to do the Holter monitor With her diuretics; she is taking two of the 20 mg BID; same with ppotassium, two of the 10 mEq BID Taking spironolactone, taking 50 mg daily  She needs Rx for magnesium; wants to increase the dose from one pill a day to two pills a day  Still taking vit D supplementation, 50k iu weekly; 12 week Rx written by last provider  When starting cymbalta, she could tell a difference at first; weaning up, still better than what it was; just in awful pain, achy and tired; the atorvastatin has been decreased; she has super high TG  She does not think she can work right now; just to sit up and pay the bills, she's just beat; the pain is worse than anything; every joint and  every muscle in her body hurts  Depression screen [facility] 2/9 02/13/2016 02/02/2016 11/19/2015 06/06/2015  Decreased Interest 0 0 0 2  Down, Depressed, Hopeless 1 1 0 3  PHQ - 2 Score 1 1 0 5  Altered sleeping 3 - - 1  Tired, decreased energy 3 - - 1  Change in appetite 0 - - 0  Feeling bad or failure about yourself  0 - - 1  Trouble concentrating 1 - - 2  Moving slowly or fidgety/restless 0 - - 0  Suicidal thoughts 0 - - 0  PHQ-9 Score 8 - - 10  Difficult doing work/chores Very difficult - - Somewhat difficult   Relevant past medical, surgical, family and social history reviewed Past Medical History  Diagnosis Date  . Vaginal enterocele   . High cholesterol   . GERD (gastroesophageal reflux disease)   . Exposure to hepatitis C   . Bacterial vaginitis   . Yeast infection   . Kidney disease   . Urinary retention with incomplete bladder emptying   . Vaginal dryness, menopausal   . Increased BMI   . Hypokalemia   . Rectocele   . Anxiety and depression   . Cystocele   . Elevated fasting blood sugar   .  Hypertension   . Vitamin D deficiency 12/03/2014   Past Surgical History  Procedure Laterality Date  . Thumb surgery    . Partial hysterectomy    . Appendectomy     Family History  Problem Relation Age of Onset  . Stroke Father    Social History  Substance Use Topics  . Smoking status: Former Research scientist (life sciences)  . Smokeless tobacco: None  . Alcohol Use: 0.0 oz/week    0 Standard drinks or equivalent per week   Interim medical history since last visit reviewed. Allergies and medications reviewed  Review of Systems Per HPI unless specifically indicated above     Objective:    BP 122/78 mmHg  Pulse 97  Temp(Src) 98.2 F (36.8 C) (Oral)  Resp 16  Wt 191 lb (86.637 kg)  SpO2 96%  Wt Readings from Last 3 Encounters:  03/02/16 191 lb (86.637 kg)  02/19/16 189 lb 8 oz (85.957 kg)  02/13/16 191 lb 9.6 oz (86.909 kg)    Physical Exam  Constitutional: She appears well-developed and  well-nourished.  HENT:  Mouth/Throat: Mucous membranes are normal.  Eyes: EOM are normal. No scleral icterus.  Neck: No JVD present.  Cardiovascular: Normal rate and regular rhythm.   Pulmonary/Chest: Effort normal and breath sounds normal.  Musculoskeletal: She exhibits edema (trace nonpitting edema).  Skin: No pallor.  Psychiatric: She has a normal mood and affect. Her behavior is normal.   Results for orders placed or performed during the hospital encounter of 02/26/16  NM Myocar Multi W/Spect W/Wall Motion / EF  Result Value Ref Range   Rest HR 76 bpm   Rest BP 131/90 mmHg   Exercise duration (min) 7 min   Exercise duration (sec)  sec   Estimated workload 8.5 METS   Peak HR 150 BPM   Peak BP  mmHg   MPHR  bpm   Percent HR 88 %   RPE     LV sys vol 19 mL   TID 1.03    LV dias vol 62 46 - 106 mL   LHR     SSS 0    SRS 0    SDS 0    Phase 1 name Exercise    Stage 1 Name STAGE 1    Stage 1 Attribute Baseline    Stage 1 Time 00:00:01    Stage 1 Speed 0.0 mph   Stage 1 Grade 0.0 %   Stage 1 HR 100 bpm   Phase 2 Name Exercise    Stage 2 Name STAGE 1    Stage 2 Time 00:03:00    Stage 2 Speed 1.7 mph   Stage 2 Grade 10.0 %   Stage 2 HR 121 bpm   Stage 2 SBP 152 mmHg   Stage 2 DBP 72 mmHg   Phase 3 Name Exercise    Stage 3 Name STAGE 2    Stage 3 Time 00:03:00    Stage 3 Speed 2.5 mph   Stage 3 Grade 12.0 %   Stage 3 HR 146 bpm   Stage 3 SBP 171 mmHg   Stage 3 DBP 78 mmHg   Phase 4 Name Exercise    Stage 4 Name STAGE 3    Stage 4 Attribute Peak    Stage 4 Time 00:01:01    Stage 4 Speed 3.4 mph   Stage 4 Grade 14.0 %   Stage 4 HR 150 bpm   Phase 5 Name Recovery    Stage 5 Attribute  Recovery 58min    Stage 5 Time 00:01:00    Stage 5 Speed 0.0 mph   Stage 5 Grade 0.0 %   Stage 5 HR 136 bpm   Phase 6 Name Recovery    Stage 6 Time 00:06:09    Stage 6 Speed 0.0 mph   Stage 6 Grade 0.0 %   Stage 6 HR 98 bpm   Stage 6 SBP 134 mmHg   Stage 6 DBP 78 mmHg    Percent of predicted max HR 88 %      Assessment & Plan:   Problem List Items Addressed This Visit      Musculoskeletal and Integument   Fibromyalgia    Tapering up; will start 60 mg daily soon; may need 90 mg daily; recheck in 3 weeks; out of work for another 3 weeks until day after our appointment        Other   Vitamin D deficiency    Continue vitamin D supplement for 12 week course, then 1000 iu daily      Hypokalemia - Primary    Check levels today; patient not ready to decrease her diuretics today; will check labs and readdress the taper of diuretics at next visit      Relevant Orders   Basic metabolic panel   Magnesium   Hyperlipidemia LDL goal <100    On half dose of statin; patient not ready to stop altogether; will check CK today      Heart palpitations    PVCs on stress test; pending holter monitor; check -lytes      Myalgia   Relevant Orders   CK (Creatine Kinase)      Follow up plan: Return in about 3 weeks (around 03/23/2016).  An after-visit summary was printed and given to the patient at [place].  Please see the patient instructions which may contain other information and recommendations beyond what is mentioned above in the assessment and plan.  Meds ordered this encounter  Medications  . magnesium oxide (MAG-OX) 400 MG tablet    Sig: Take 1 tablet (400 mg total) by mouth 2 (two) times daily.    Dispense:  60 tablet    Refill:  2  . DULoxetine (CYMBALTA) 60 MG capsule    Sig: Take 1 capsule (60 mg total) by mouth daily. After tapering up    Dispense:  30 capsule    Refill:  0    Orders Placed This Encounter  Procedures  . Basic metabolic panel  . Magnesium  . CK (Creatine Kinase)

## 2016-03-02 NOTE — Assessment & Plan Note (Signed)
Tapering up; will start 60 mg daily soon; may need 90 mg daily; recheck in 3 weeks; out of work for another 3 weeks until day after our appointment

## 2016-03-02 NOTE — Assessment & Plan Note (Signed)
PVCs on stress test; pending holter monitor; check -lytes

## 2016-03-02 NOTE — Assessment & Plan Note (Signed)
On half dose of statin; patient not ready to stop altogether; will check CK today

## 2016-03-02 NOTE — Patient Instructions (Addendum)
When you finish Rx vitamin D, start 1000 iu vitamin D3 once a day Check labs today Return in 3 weeks to see if cymbalta needs to be increased and we'll think about weaning the diuretics at that visit a little more Out of work until I see you again in 3 weeks

## 2016-03-02 NOTE — Assessment & Plan Note (Signed)
Check levels today; patient not ready to decrease her diuretics today; will check labs and readdress the taper of diuretics at next visit

## 2016-03-02 NOTE — Assessment & Plan Note (Addendum)
Continue vitamin D supplement for 12 week course, then 1000 iu daily

## 2016-03-09 ENCOUNTER — Ambulatory Visit (INDEPENDENT_AMBULATORY_CARE_PROVIDER_SITE_OTHER): Payer: BLUE CROSS/BLUE SHIELD

## 2016-03-09 ENCOUNTER — Other Ambulatory Visit: Payer: Self-pay

## 2016-03-09 ENCOUNTER — Telehealth: Payer: Self-pay | Admitting: Family Medicine

## 2016-03-09 DIAGNOSIS — R002 Palpitations: Secondary | ICD-10-CM

## 2016-03-09 DIAGNOSIS — R609 Edema, unspecified: Secondary | ICD-10-CM

## 2016-03-09 DIAGNOSIS — R0602 Shortness of breath: Secondary | ICD-10-CM

## 2016-03-09 DIAGNOSIS — R079 Chest pain, unspecified: Secondary | ICD-10-CM

## 2016-03-09 DIAGNOSIS — E876 Hypokalemia: Secondary | ICD-10-CM

## 2016-03-09 NOTE — Telephone Encounter (Signed)
BMP order ready; thanks

## 2016-03-09 NOTE — Telephone Encounter (Signed)
Done lab slip up front

## 2016-03-09 NOTE — Telephone Encounter (Signed)
Requesting that you write a lab order for it to be drawn at the hospital tomorrow.

## 2016-03-09 NOTE — Assessment & Plan Note (Signed)
Check K+ 

## 2016-03-10 ENCOUNTER — Other Ambulatory Visit
Admission: RE | Admit: 2016-03-10 | Discharge: 2016-03-10 | Disposition: A | Discharge status: Home / Self Care | Payer: BLUE CROSS/BLUE SHIELD | Source: Ambulatory Visit | Attending: Family Medicine | Admitting: Family Medicine

## 2016-03-10 DIAGNOSIS — E876 Hypokalemia: Secondary | ICD-10-CM | POA: Diagnosis not present

## 2016-03-10 LAB — BASIC METABOLIC PANEL
Anion gap: 13 (ref 5–15)
BUN: 16 mg/dL (ref 6–20)
CHLORIDE: 99 mmol/L — AB (ref 101–111)
CO2: 26 mmol/L (ref 22–32)
CREATININE: 0.89 mg/dL (ref 0.44–1.00)
Calcium: 9.9 mg/dL (ref 8.9–10.3)
GFR calc non Af Amer: >60 mL/min (ref >60)
Glucose, Bld: 99 mg/dL (ref 65–99)
Potassium: 3.7 mmol/L (ref 3.5–5.1)
Sodium: 138 mmol/L (ref 135–145)

## 2016-03-11 ENCOUNTER — Ambulatory Visit
Admission: RE | Admit: 2016-03-11 | Discharge: 2016-03-11 | Disposition: A | Discharge status: Home / Self Care | Payer: BLUE CROSS/BLUE SHIELD | Source: Ambulatory Visit | Attending: Cardiology | Admitting: Cardiology

## 2016-03-11 DIAGNOSIS — R079 Chest pain, unspecified: Secondary | ICD-10-CM | POA: Insufficient documentation

## 2016-03-11 DIAGNOSIS — R0602 Shortness of breath: Secondary | ICD-10-CM | POA: Insufficient documentation

## 2016-03-11 DIAGNOSIS — R002 Palpitations: Secondary | ICD-10-CM | POA: Diagnosis present

## 2016-03-23 ENCOUNTER — Encounter: Payer: Self-pay | Admitting: Cardiology

## 2016-03-23 ENCOUNTER — Ambulatory Visit (INDEPENDENT_AMBULATORY_CARE_PROVIDER_SITE_OTHER): Payer: BLUE CROSS/BLUE SHIELD | Admitting: Cardiology

## 2016-03-23 VITALS — BP 130/82 | HR 91 | Ht 65.0 in | Wt 195.0 lb

## 2016-03-23 DIAGNOSIS — I493 Ventricular premature depolarization: Secondary | ICD-10-CM | POA: Diagnosis not present

## 2016-03-23 DIAGNOSIS — R609 Edema, unspecified: Secondary | ICD-10-CM | POA: Diagnosis not present

## 2016-03-23 DIAGNOSIS — E669 Obesity, unspecified: Secondary | ICD-10-CM | POA: Diagnosis not present

## 2016-03-23 DIAGNOSIS — R0602 Shortness of breath: Secondary | ICD-10-CM

## 2016-03-23 MED ORDER — METOPROLOL SUCCINATE ER 25 MG PO TB24: 25.0000 mg | ORAL_TABLET | Freq: Every day | ORAL | Status: DC

## 2016-03-23 NOTE — Patient Instructions (Signed)
Medication Instructions:  Your physician has recommended you make the following change in your medication:  1. Start metoprolol 25 mg once daily   Labwork: None ordered  Testing/Procedures: None ordered  Follow-Up: Your physician recommends that you schedule a follow-up appointment in: 3 months with Dr. Yvone Neu.  Date & Time: ___________________________________________________________________   Any Other Special Instructions Will Be Listed Below (If Applicable).  Discuss possible sleep study testing with your primary care physician.   If you need a refill on your cardiac medications before your next appointment, please call your pharmacy.

## 2016-03-23 NOTE — Progress Notes (Signed)
Cardiology Office Note   Date:  03/23/2016   ID:  Laura [name], DOB November 29, 1965, MRN [id number]  Referring Doctor:  Enid Derry, MD   Cardiologist:   Wende Bushy, MD   Reason for consultation:  Chief Complaint  Patient presents with  . other    F/u echo/holter results c/o palpitations. Meds reviewed verbally with pt.      History of Present Illness: Laura [name] is a 50 y.o. female who presents for Follow-up after tests.   In terms of palpitations, she continues to have episodes. Episodes are similar to prior ones.  In terms of the shortness of breath, she experiences this with the palpitations.   Otherwise, patient denies headache, fever, cough, colds, abdominal pain, orthopnea, PND.  ROS:  Please see the history of present illness. Aside from mentioned under HPI, all other systems are reviewed and negative.    Past Medical History  Diagnosis Date  . Vaginal enterocele   . High cholesterol   . GERD (gastroesophageal reflux disease)   . Exposure to hepatitis C   . Bacterial vaginitis   . Yeast infection   . Kidney disease   . Urinary retention with incomplete bladder emptying   . Vaginal dryness, menopausal   . Increased BMI   . Hypokalemia   . Rectocele   . Anxiety and depression   . Cystocele   . Elevated fasting blood sugar   . Hypertension   . Vitamin D deficiency 12/03/2014    Past Surgical History  Procedure Laterality Date  . Thumb surgery    . Partial hysterectomy    . Appendectomy       reports that she has quit smoking. She does not have any smokeless tobacco history on file. She reports that she drinks alcohol. She reports that she does not use illicit drugs.   family history includes Stroke in her father.   Current Outpatient Prescriptions  Medication Sig Dispense Refill  . albuterol (PROVENTIL HFA;VENTOLIN HFA) 108 (90 Base) MCG/ACT inhaler Inhale 2 puffs into the lungs every 4 (four) hours as needed for wheezing or shortness of  breath. 1 Inhaler 1  . ALPRAZolam (XANAX) 1 MG tablet Take 1 mg by mouth 4 (four) times daily.    Marland Kitchen amphetamine-dextroamphetamine (ADDERALL) 20 MG tablet   0  . atorvastatin (LIPITOR) 40 MG tablet Take 0.5 tablets (20 mg total) by mouth daily. 45 tablet 0  . buPROPion (WELLBUTRIN XL) 150 MG 24 hr tablet   0  . diclofenac sodium (VOLTAREN) 1 % GEL Apply topically 2 (two) times daily. Reported on 02/19/2016    . DULoxetine (CYMBALTA) 60 MG capsule Take 1 capsule (60 mg total) by mouth daily. After tapering up 30 capsule 0  . EPINEPHRINE 0.3 mg/0.3 mL IJ SOAJ injection Inject 0.3 mLs (0.3 mg total) into the muscle once. 2 Device 1  . furosemide (LASIX) 20 MG tablet Two tablets (40 mg) every morning, one tablet (20 mg) every afternoon - new instructions 90 tablet 0  . glucose blood (ACCU-CHEK SMARTVIEW) test strip Use as instructed to check blood glucose once a day and prn 50 each 1  . Insulin Pen Needle 32G X 6 MM MISC 1 Device by Does not apply route daily. 50 each 1  . levothyroxine (SYNTHROID, LEVOTHROID) 125 MCG tablet TAKE ONE TABLET BY MOUTH ONCE DAILY 90 tablet 1  . magnesium oxide (MAG-OX) 400 MG tablet Take 1 tablet (400 mg total) by mouth 2 (two) times daily.  60 tablet 2  . metroNIDAZOLE (METROGEL) 0.75 % vaginal gel Place 1 Applicatorful vaginally daily as needed. Patient name previously Henri Shira 70 g 2  . montelukast (SINGULAIR) 10 MG tablet Take 1 tablet (10 mg total) by mouth at bedtime. For allergies and asthma 90 tablet 3  . pantoprazole (PROTONIX) 40 MG tablet Take 1 tablet (40 mg total) by mouth daily. 90 tablet 2  . potassium chloride (K-DUR,KLOR-CON) 10 MEQ tablet Take 2 tablets (20 mEq total) by mouth 2 (two) times daily. 1 tablet 0  . spironolactone (ALDACTONE) 50 MG tablet Take 1 tablet (50 mg total) by mouth daily. 1 tablet 0  . tiZANidine (ZANAFLEX) 4 MG capsule Take 4 mg by mouth 3 (three) times daily as needed.    . valsartan (DIOVAN) 160 MG tablet Take 1 tablet (160 mg  total) by mouth daily. 90 tablet 3  . Vitamin D, Ergocalciferol, (DRISDOL) 50000 units CAPS capsule TAKE ONE CAPSULE BY MOUTH ONCE A WEEK 12 capsule 0   No current facility-administered medications for this visit.    Allergies: Iodine; Meperidine; Shellfish allergy; and Mirtazapine    PHYSICAL EXAM: VS:  BP 130/82 mmHg  Pulse 91  Ht 5\' 5"  (1.651 m)  Wt 195 lb (88.451 kg)  BMI 32.45 kg/m2 , Body mass index is 32.45 kg/(m^2). Wt Readings from Last 3 Encounters:  03/23/16 195 lb (88.451 kg)  03/02/16 191 lb (86.637 kg)  02/19/16 189 lb 8 oz (85.957 kg)    GENERAL:  well developed, well nourished, obese, not in acute distress HEENT: normocephalic, pink conjunctivae, anicteric sclerae, no xanthelasma, normal dentition, oropharynx clear NECK:  no neck vein engorgement, JVP normal, no hepatojugular reflux, carotid upstroke brisk and symmetric, no bruit, no thyromegaly, no lymphadenopathy LUNGS:  good respiratory effort, clear to auscultation bilaterally CV:  PMI not displaced, no thrills, no lifts, S1 and S2 within normal limits, no palpable S3 or S4, no murmurs, no rubs, no gallops ABD:  Soft, nontender, nondistended, normoactive bowel sounds, no abdominal aortic bruit, no hepatomegaly, no splenomegaly MS: nontender back, no kyphosis, no scoliosis, no joint deformities EXT:  2+ DP/PT pulses, Nonpitting edema over the dorsum of right foot, no varicosities, no cyanosis, no clubbing SKIN: warm, nondiaphoretic, normal turgor, no ulcers NEUROPSYCH: alert, oriented to person, place, and time, sensory/motor grossly intact, normal mood, appropriate affect  Recent Labs: 12/16/2015: TSH 3.060 02/03/2016: ALT 19; Hemoglobin 12.9; Platelets 338 02/19/2016: BNP 4.8 03/02/2016: Magnesium 1.8 03/10/2016: BUN 16; Creatinine, Ser 0.89; Potassium 3.7; Sodium 138   Lipid Panel    Component Value Date/Time   CHOL 238* 11/24/2015 0826   TRIG 319* 11/24/2015 0826   HDL 50 11/24/2015 0826   CHOLHDL 4.8*  11/24/2015 0826   LDLCALC 124* 11/24/2015 0826     Other studies Reviewed:  EKG:  The ekg from 02/19/2016 was personally reviewed by me and it revealed sinus rhythm, 71 BPM, low voltage QRS.  Additional studies/ records that were reviewed personally reviewed by me today include: Echo 03/09/2016: Left ventricle: The cavity size was normal. Systolic function was  normal. The estimated ejection fraction was in the range of 60%  to 65%. Wall motion was normal; there were no regional wall  motion abnormalities. Left ventricular diastolic function  parameters were normal. - Mitral valve: There was mild regurgitation. - Left atrium: The atrium was normal in size. - Right ventricle: Systolic function was normal. - Pulmonary arteries: Systolic pressure was within the normal  range. - Inferior vena cava:  The vessel was normal in size. The  respirophasic diameter changes were in the normal range (>= 50%),  consistent with normal central venous pressure.  Holter 03/09/2016: Overall rhythm was sinus, average of 83 BPM. Minimum heart rate of 64 BPM. Maximum heart rate of 123 BPM.  Supraventricular ectopy comprised of 4 isolated PACs.  Ventricular ectopy comprised of 9% of the total number of beats:10,356 isolated PVCs. One ventricular couplet. 3842 in ventricular bigeminy. 1 nonsustained ventricular run comprised of 4 beats at 146 BPM, 4 a.m. 03/10/2016.  Ex nuc stress test 02/26/2016:  Blood pressure demonstrated a hypertensive response to exercise. Frequent PVCs with exercise.  There was no ST segment deviation noted during stress.  No T wave inversion was noted during stress.  The study is normal.  This is a low risk study. The left ventricular ejection fraction is normal (55-65%).    ASSESSMENT AND PLAN:  Palpitations Discussed findings of Holter monitor: Ventricular ectopy comprised 9% of the total number of beats.10,356 isolated PVCs. One ventricular couplet. 3842 in  ventricular bigeminy. 1 nonsustained ventricular run comprised of 4 beats at 146 BPM, 4 a.m. 03/10/2016. No ischemia on stress testing. Recommend to start beta blocker, metoprolol ER 25 mg once a day. Monitor blood pressure and symptoms. Patient reports that she has been noted to snore. We talked about possibility of sleep apnea in relation to her PVCs. She will discuss this with her PCP and follow-up tomorrow. Recommend sleep study.  Shortness of breath with exertion Risk factors for coronary artery disease include hypertension, postmenopausal state, hyperlipidemia. Exercise nuclear stress test did not reveal ischemia. Normal study. This portends a low likelihood of clinically significant CAD. Recommend risk factor modification.  Swelling Nonpitting edema over right foot No other evidence of congestive heart failure on physical exam Preserved ejection fraction and echocardiogram. BNP was within normal limits. All these findings point to low likelihood of CHF. Recommend sodium restriction.  Obesity Body mass index is 32.45 kg/(m^2).Marland Kitchen Recommend aggressive weight loss through diet and increased physical activity.  Current medicines are reviewed at length with the patient today.  The patient does not have concerns regarding medicines.  Labs/ tests ordered today include:  No orders of the defined types were placed in this encounter.    I had a lengthy and detailed discussion with the patient regarding diagnoses, prognosis, diagnostic options, treatment options .   I counseled the patient on importance of lifestyle modification including heart healthy diet, regular physical activity .   Disposition:   FU with undersigned in 3 months  Signed, Wende Bushy, MD  03/23/2016 9:10 AM    [place]

## 2016-03-24 ENCOUNTER — Ambulatory Visit (INDEPENDENT_AMBULATORY_CARE_PROVIDER_SITE_OTHER): Payer: BLUE CROSS/BLUE SHIELD | Admitting: Family Medicine

## 2016-03-24 ENCOUNTER — Other Ambulatory Visit: Payer: Self-pay | Admitting: Family Medicine

## 2016-03-24 ENCOUNTER — Other Ambulatory Visit
Admission: RE | Admit: 2016-03-24 | Discharge: 2016-03-24 | Disposition: A | Discharge status: Home / Self Care | Payer: BLUE CROSS/BLUE SHIELD | Source: Ambulatory Visit | Attending: Family Medicine | Admitting: Family Medicine

## 2016-03-24 ENCOUNTER — Encounter: Payer: Self-pay | Admitting: Family Medicine

## 2016-03-24 VITALS — BP 110/84 | HR 97 | Temp 98.0°F | Resp 16 | Wt 195.0 lb

## 2016-03-24 DIAGNOSIS — M797 Fibromyalgia: Secondary | ICD-10-CM | POA: Diagnosis not present

## 2016-03-24 DIAGNOSIS — H11002 Unspecified pterygium of left eye: Secondary | ICD-10-CM | POA: Diagnosis not present

## 2016-03-24 DIAGNOSIS — R600 Localized edema: Secondary | ICD-10-CM

## 2016-03-24 DIAGNOSIS — E876 Hypokalemia: Secondary | ICD-10-CM

## 2016-03-24 DIAGNOSIS — J0100 Acute maxillary sinusitis, unspecified: Secondary | ICD-10-CM | POA: Diagnosis not present

## 2016-03-24 DIAGNOSIS — E669 Obesity, unspecified: Secondary | ICD-10-CM | POA: Diagnosis not present

## 2016-03-24 LAB — BASIC METABOLIC PANEL
ANION GAP: 8 (ref 5–15)
BUN: 14 mg/dL (ref 6–20)
CHLORIDE: 102 mmol/L (ref 101–111)
CO2: 27 mmol/L (ref 22–32)
CREATININE: 0.78 mg/dL (ref 0.44–1.00)
Calcium: 9.3 mg/dL (ref 8.9–10.3)
GFR calc non Af Amer: >60 mL/min (ref >60)
Glucose, Bld: 100 mg/dL — ABNORMAL HIGH (ref 65–99)
POTASSIUM: 3.6 mmol/L (ref 3.5–5.1)
SODIUM: 137 mmol/L (ref 135–145)

## 2016-03-24 LAB — MAGNESIUM: Magnesium: 1.9 mg/dL (ref 1.7–2.4)

## 2016-03-24 MED ORDER — DULOXETINE HCL 30 MG PO CPEP: 90.0000 mg | ORAL_CAPSULE | Freq: Every day | ORAL | Status: DC

## 2016-03-24 MED ORDER — POTASSIUM CHLORIDE CRYS ER 10 MEQ PO TBCR: 20.0000 meq | EXTENDED_RELEASE_TABLET | Freq: Two times a day (BID) | ORAL | Status: DC

## 2016-03-24 MED ORDER — POTASSIUM CHLORIDE CRYS ER 10 MEQ PO TBCR: EXTENDED_RELEASE_TABLET | ORAL | Status: DC

## 2016-03-24 MED ORDER — FUROSEMIDE 20 MG PO TABS: ORAL_TABLET | ORAL | Status: DC

## 2016-03-24 MED ORDER — AMOXICILLIN-POT CLAVULANATE 875-125 MG PO TABS: 1.0000 | ORAL_TABLET | Freq: Two times a day (BID) | ORAL | Status: DC

## 2016-03-24 NOTE — Assessment & Plan Note (Signed)
Patient will see eye doctor soon 

## 2016-03-24 NOTE — Assessment & Plan Note (Addendum)
Continue to be out of work as we taper up medicine; increase cymbalta from 60 mg daily to 90 mg daily; return in 3 weeks; I'm trying to dissuade her from thinking of this as a permanent reason to seek disability; I think with the right medicine, activity level, weight loss, etc, she should begin to feel better

## 2016-03-24 NOTE — Assessment & Plan Note (Signed)
Continue to wean.

## 2016-03-24 NOTE — Assessment & Plan Note (Signed)
Check potassium and magnesium today; continue to wean the diuretics

## 2016-03-24 NOTE — Assessment & Plan Note (Signed)
Antibiotic; cautions about C diff; see AVS

## 2016-03-24 NOTE — Assessment & Plan Note (Signed)
Start back on Saxenda; return in 6 weeks just for obesity and weight discussion

## 2016-03-24 NOTE — Progress Notes (Signed)
BP 110/84 mmHg  Pulse 97  Temp(Src) 98 F (36.7 C) (Oral)  Resp 16  Wt 195 lb (88.451 kg)  SpO2 95%   Subjective:    Patient ID: Laura [name], female    DOB: 10-22-1966, 50 y.o.   MRN: [id number]  HPI: Laura [name] is a 50 y.o. female  Chief Complaint  Patient presents with  . Follow-up   She is hurting all over pretty much; neck and upper back and lower back and chest and hands; definitely the hands, "those are my worst"; my muscles Hurts to just do the dishes; just standing at the sink to do a load of dishes She is trying to get long-term disability now; she has exceeded her short-term disability benefits She was referred to neurologist, but she never saw Dr. Melrose Nakayama; she is not having any confusion; she does not think it's necessary to see him; she thinks that was medicine effect; not feeling crazy anymore; just physical part; husband still not letting her drive; she can be out doing something and within minutes she can be exhausted  She saw cardiologist yesterday, Dr. Yvone Neu; they did holter monitor, significant amount were double beats; leaky valve too; goes back to see her in 3 months; on metoprolol for the double beats; I corrected med list; she is only on the long-acting metoprolol  Hx of low potassium; feels like it is low today  Reddish blood vessel growth on the lateral left eye; been there since February; eyes feel itchy; has eye doctor appt coming up  Sinuses bothering her; a good 10 days or so; slight fever; L=R; blowiing out nasty bloody stuff, green; pain and pressure in cheeks, "major" pain and pressure, just to lay face on pillow  She asked about Saxenda; has been on it before; no hx of thyroid cancer; no hx of MEN-2; she did not have problems from the drug; her peak weight was 227 pounds; 195 pounds right now; goal is 145-150 pounds; she does not think seeing nutritionist would be helpful; eats well  Depression screen [facility] 2/9 03/24/2016 02/13/2016 02/02/2016  11/19/2015 06/06/2015  Decreased Interest 3 0 0 0 2  Down, Depressed, Hopeless 2 1 1  0 3  PHQ - 2 Score 5 1 1  0 5  Altered sleeping 2 3 - - 1  Tired, decreased energy 3 3 - - 1  Change in appetite 0 0 - - 0  Feeling bad or failure about yourself  2 0 - - 1  Trouble concentrating 3 1 - - 2  Moving slowly or fidgety/restless 0 0 - - 0  Suicidal thoughts 0 0 - - 0  PHQ-9 Score 15 8 - - 10  Difficult doing work/chores Somewhat difficult Very difficult - - Somewhat difficult   Relevant past medical, surgical, family and social history reviewed Past Medical History  Diagnosis Date  . Vaginal enterocele   . High cholesterol   . GERD (gastroesophageal reflux disease)   . Exposure to hepatitis C   . Bacterial vaginitis   . Yeast infection   . Kidney disease   . Urinary retention with incomplete bladder emptying   . Vaginal dryness, menopausal   . Increased BMI   . Hypokalemia   . Rectocele   . Anxiety and depression   . Cystocele   . Elevated fasting blood sugar   . Hypertension   . Vitamin D deficiency 12/03/2014   Past Surgical History  Procedure Laterality Date  . Thumb surgery    .  Partial hysterectomy    . Appendectomy     Family History  Problem Relation Age of Onset  . Stroke Father    Social History  Substance Use Topics  . Smoking status: Former Research scientist (life sciences)  . Smokeless tobacco: None  . Alcohol Use: 0.0 oz/week    0 Standard drinks or equivalent per week   Interim medical history since last visit reviewed. Allergies and medications reviewed  Review of Systems Per HPI unless specifically indicated above     Objective:    BP 110/84 mmHg  Pulse 97  Temp(Src) 98 F (36.7 C) (Oral)  Resp 16  Wt 195 lb (88.451 kg)  SpO2 95%  Wt Readings from Last 3 Encounters:  03/24/16 195 lb (88.451 kg)  03/23/16 195 lb (88.451 kg)  03/02/16 191 lb (86.637 kg)   body mass index is 32.45 kg/(m^2).  Physical Exam  Constitutional: She appears well-developed and  well-nourished. No distress.  HENT:  Head: Normocephalic and atraumatic.  Nose: Rhinorrhea present.  Mouth/Throat: Oropharynx is clear and moist and mucous membranes are normal.  Bloody purulent material both nares  Eyes: EOM are normal. No scleral icterus.  Yellowish-erythematous fibrous appearing growth of tissue lateral left eye  Neck: No thyromegaly present.  Cardiovascular: Normal rate, regular rhythm and normal heart sounds.   No murmur heard. Pulmonary/Chest: Effort normal and breath sounds normal. No respiratory distress. She has no wheezes.  Abdominal: Soft. Bowel sounds are normal. She exhibits no distension.  Musculoskeletal: Normal range of motion. She exhibits no edema.  Neurological: She is alert. She exhibits normal muscle tone.  Skin: Skin is warm and dry. No pallor.  Psychiatric: She has a normal mood and affect. Her behavior is normal. Judgment and thought content normal.   Results for orders placed or performed during the hospital encounter of 0000000  Basic metabolic panel  Result Value Ref Range   Sodium 138 135 - 145 mmol/L   Potassium 3.7 3.5 - 5.1 mmol/L   Chloride 99 (L) 101 - 111 mmol/L   CO2 26 22 - 32 mmol/L   Glucose, Bld 99 65 - 99 mg/dL   BUN 16 6 - 20 mg/dL   Creatinine, Ser 0.89 0.44 - 1.00 mg/dL   Calcium 9.9 8.9 - 10.3 mg/dL   GFR calc non Af Amer >60 >60 mL/min   GFR calc Af Amer >60 >60 mL/min   Anion gap 13 5 - 15      Assessment & Plan:   Problem List Items Addressed This Visit      Respiratory   Maxillary sinusitis, acute    Antibiotic; cautions about C diff; see AVS      Relevant Medications   amoxicillin-clavulanate (AUGMENTIN) 875-125 MG tablet     Musculoskeletal and Integument   Fibromyalgia - Primary    Continue to be out of work as we taper up medicine; increase cymbalta from 60 mg daily to 90 mg daily; return in 3 weeks; I'm trying to dissuade her from thinking of this as a permanent reason to seek disability; I think  with the right medicine, activity level, weight loss, etc, she should begin to feel better        Other   Bilateral lower extremity edema    Continue to wean      Hypokalemia    Check potassium and magnesium today; continue to wean the diuretics      Relevant Orders   Magnesium   Basic metabolic panel   Obesity, Class  I, BMI 30.0-34.9 (see actual BMI)    Start back on Saxenda; return in 6 weeks just for obesity and weight discussion      Relevant Medications   Liraglutide -Weight Management (SAXENDA) 18 MG/3ML SOPN   Pterygium of left eye    Patient will see eye doctor soon         Follow up plan: Return in about 3 weeks (around 04/14/2016) for fibromyalgia; 6 weeks for obesity/Saxenda.  An after-visit summary was printed and given to the patient at [facility].  Please see the patient instructions which may contain other information and recommendations beyond what is mentioned above in the assessment and plan.  Meds ordered this encounter  Medications  . DISCONTD: metoprolol tartrate (LOPRESSOR) 25 MG tablet    Sig: Take 25 mg by mouth daily.  . Liraglutide -Weight Management (SAXENDA) 18 MG/3ML SOPN    Sig: Inject 0.6 mg into the skin. Daily x 1 week, then 1.2 mg daily x 1 week, then 1.8 mg daily x 1 week, then 2.4 mg daily x 1 week, then 3 mg daily    Dispense:  1 mL    Refill:  0  . DULoxetine (CYMBALTA) 30 MG capsule    Sig: Take 3 capsules (90 mg total) by mouth daily. (stop the 60 mg strength)    Dispense:  270 capsule    Refill:  1    Changing dose from 60 mg daily to 90 mg daily  . amoxicillin-clavulanate (AUGMENTIN) 875-125 MG tablet    Sig: Take 1 tablet by mouth 2 (two) times daily.    Dispense:  20 tablet    Refill:  0    Orders Placed This Encounter  Procedures  . Magnesium  . Basic metabolic panel

## 2016-03-24 NOTE — Patient Instructions (Addendum)
Please do see the eye doctor for the growth on your left eye Do talk with a disability lawyer as you consider disability Start the augmentin Please do eat yogurt daily or take a probiotic daily for the next month or two We want to replace the healthy germs in the gut If you notice foul, watery diarrhea in the next two months, schedule an appointment RIGHT AWAY Use nasal saline rinses Increase the cymbalta from 60 mg daily to 90 mg daily Gradually increase low impact activity Have labs done today

## 2016-03-25 ENCOUNTER — Other Ambulatory Visit: Payer: Self-pay | Admitting: Family Medicine

## 2016-03-25 MED ORDER — FUROSEMIDE 20 MG PO TABS: ORAL_TABLET | ORAL | Status: DC

## 2016-04-01 ENCOUNTER — Other Ambulatory Visit: Payer: Self-pay | Admitting: Family Medicine

## 2016-04-01 NOTE — Telephone Encounter (Signed)
I already denied the OLD furosemide prescription; a newer one was just sent on 03/25/16

## 2016-04-01 NOTE — Telephone Encounter (Signed)
I received a refill request for furosemide; I sent new Rx with new instructions on 03/25/16 Please resolve with pharmacy Thanks

## 2016-04-02 ENCOUNTER — Other Ambulatory Visit: Payer: Self-pay

## 2016-04-03 MED ORDER — INSULIN PEN NEEDLE 32G X 6 MM MISC: 1.0000 | Freq: Every day | Status: DC

## 2016-04-07 ENCOUNTER — Telehealth: Payer: Self-pay | Admitting: Cardiology

## 2016-04-07 ENCOUNTER — Encounter: Payer: Self-pay | Admitting: Family Medicine

## 2016-04-07 ENCOUNTER — Telehealth: Payer: Self-pay | Admitting: Family Medicine

## 2016-04-07 ENCOUNTER — Other Ambulatory Visit
Admission: RE | Admit: 2016-04-07 | Discharge: 2016-04-07 | Disposition: A | Discharge status: Home / Self Care | Payer: BLUE CROSS/BLUE SHIELD | Source: Ambulatory Visit | Attending: Family Medicine | Admitting: Family Medicine

## 2016-04-07 DIAGNOSIS — E876 Hypokalemia: Secondary | ICD-10-CM | POA: Diagnosis not present

## 2016-04-07 DIAGNOSIS — K219 Gastro-esophageal reflux disease without esophagitis: Secondary | ICD-10-CM

## 2016-04-07 DIAGNOSIS — K22719 Barrett's esophagus with dysplasia, unspecified: Secondary | ICD-10-CM

## 2016-04-07 DIAGNOSIS — K2270 Barrett's esophagus without dysplasia: Secondary | ICD-10-CM

## 2016-04-07 HISTORY — DX: Barrett's esophagus without dysplasia: K22.70

## 2016-04-07 LAB — POTASSIUM: POTASSIUM: 4 mmol/L (ref 3.5–5.1)

## 2016-04-07 MED ORDER — PANTOPRAZOLE SODIUM 40 MG PO TBEC
40.0000 mg | DELAYED_RELEASE_TABLET | Freq: Every day | ORAL | Status: DC | PRN
Start: 2016-04-07 — End: 2016-04-07

## 2016-04-07 MED ORDER — PANTOPRAZOLE SODIUM 40 MG PO TBEC: 40.0000 mg | DELAYED_RELEASE_TABLET | Freq: Every day | ORAL | Status: DC

## 2016-04-07 MED ORDER — FLUCONAZOLE 150 MG PO TABS: 150.0000 mg | ORAL_TABLET | Freq: Once | ORAL | Status: DC

## 2016-04-07 NOTE — Telephone Encounter (Signed)
-   Informed and/or LMOV to patient regarding release of information packet.  - Mailed release packet to patient.  Pt is aware of packet coming to her and of the $25 processing fee.

## 2016-04-07 NOTE — Telephone Encounter (Signed)
Ah, thank you! That was not in her problem list or history, so I just added it to both places No, if she has Barrett's, she will stay on that; I sent new Rx to pharmacy Let her know to stay on the pantoprazole Thank you

## 2016-04-07 NOTE — Telephone Encounter (Signed)
Needs lab slip for potassium per the dr. Hazel Sams to be checked again. She is also asking for Plotonix ( for stoamch) and she needs something for a bad yeast infection.  Pharm is [facility] in [place] on [address]

## 2016-04-07 NOTE — Telephone Encounter (Signed)
Pt states has to take protonix due to barretts esophagus?

## 2016-04-07 NOTE — Telephone Encounter (Signed)
I sent the medicines as requested Do let her know, though, to NOT take her cholesterol medicine for 3 days Also, Protonix is not good to stay on long-term; it can cause problems down the road Unless she has an active ulcer or bleeding, I'd suggest she start to wean down (may skip Wednesdays and Sundays for a few weeks and see how she does, then just take one every third day, for example) I'd rather put her on something like 300 mg Zantac instead Potassium order is ready Thank you

## 2016-04-07 NOTE — Assessment & Plan Note (Signed)
Recheck K 

## 2016-04-14 ENCOUNTER — Other Ambulatory Visit
Admission: RE | Admit: 2016-04-14 | Discharge: 2016-04-14 | Disposition: A | Discharge status: Home / Self Care | Payer: BLUE CROSS/BLUE SHIELD | Source: Ambulatory Visit | Attending: Family Medicine | Admitting: Family Medicine

## 2016-04-14 ENCOUNTER — Ambulatory Visit (INDEPENDENT_AMBULATORY_CARE_PROVIDER_SITE_OTHER): Payer: BLUE CROSS/BLUE SHIELD | Admitting: Family Medicine

## 2016-04-14 ENCOUNTER — Encounter: Payer: Self-pay | Admitting: Family Medicine

## 2016-04-14 VITALS — BP 136/82 | HR 89 | Temp 98.4°F | Resp 16 | Wt 200.0 lb

## 2016-04-14 DIAGNOSIS — E876 Hypokalemia: Secondary | ICD-10-CM | POA: Diagnosis not present

## 2016-04-14 DIAGNOSIS — E039 Hypothyroidism, unspecified: Secondary | ICD-10-CM | POA: Diagnosis not present

## 2016-04-14 DIAGNOSIS — Z1211 Encounter for screening for malignant neoplasm of colon: Secondary | ICD-10-CM | POA: Diagnosis not present

## 2016-04-14 DIAGNOSIS — R002 Palpitations: Secondary | ICD-10-CM | POA: Diagnosis not present

## 2016-04-14 DIAGNOSIS — M797 Fibromyalgia: Secondary | ICD-10-CM

## 2016-04-14 DIAGNOSIS — I10 Essential (primary) hypertension: Secondary | ICD-10-CM

## 2016-04-14 DIAGNOSIS — L237 Allergic contact dermatitis due to plants, except food: Secondary | ICD-10-CM | POA: Diagnosis not present

## 2016-04-14 DIAGNOSIS — Z1239 Encounter for other screening for malignant neoplasm of breast: Secondary | ICD-10-CM

## 2016-04-14 LAB — BASIC METABOLIC PANEL
Anion gap: 8 (ref 5–15)
BUN: 14 mg/dL (ref 6–20)
CHLORIDE: 103 mmol/L (ref 101–111)
CO2: 29 mmol/L (ref 22–32)
CREATININE: 0.8 mg/dL (ref 0.44–1.00)
Calcium: 9.5 mg/dL (ref 8.9–10.3)
GFR calc Af Amer: >60 mL/min (ref >60)
GFR calc non Af Amer: >60 mL/min (ref >60)
GLUCOSE: 90 mg/dL (ref 65–99)
Potassium: 4.1 mmol/L (ref 3.5–5.1)
Sodium: 140 mmol/L (ref 135–145)

## 2016-04-14 MED ORDER — CLOBETASOL PROPIONATE 0.05 % EX CREA: 1.0000 "application " | TOPICAL_CREAM | Freq: Two times a day (BID) | CUTANEOUS | Status: DC

## 2016-04-14 NOTE — Assessment & Plan Note (Signed)
Check BMP today; still trying to wean fluid pills

## 2016-04-14 NOTE — Progress Notes (Signed)
BP 136/82 mmHg  Pulse 89  Temp(Src) 98.4 F (36.9 C) (Oral)  Resp 16  Wt 200 lb (90.719 kg)  SpO2 94%   Subjective:    Patient ID: Laura [name], female    DOB: Feb 10, 1966, 50 y.o.   MRN: [id number]  HPI: KARAN INCLAN is a 50 y.o. female  Chief Complaint  Patient presents with  . Follow-up   Since last visit, no trips to specialists Poison ivy broke out about 3 weeks ago; took a week and a half to break out; on legs and arms; extremely itchy; using calamine lotion  She is having swelling in her legs and hands; eyes puffy; has had echocardiogram by cardiologist Down to 20 mg furosemide BID; off of spironolactone; on potassium 20 am and 10 pm  One of her BP meds stopped her kidneys completely; it is not listed on her allergies; she thinks it was a -pril or in that class; just wanted [place] to know; she has been on ARB; she is not adding any salt; canning her own vegetables most years; avoid canned veggies from stores; rare bacon, once a month; no other processed meats  Weight loss attempts with Saxenda, 1.8 mg now just started this week; drinking enough water  Still having palpitations; on beta-blocker per cardiologist  Fibromyalgia; not walking every day, but trying; was in bed until 3 pm one day; on cymbalta; affected by weather, when barometric pressure changing; stress affects her, immediately starts to hurt; not doing any counseling; does some yoga at home; cut back cholesterol medicine, then cut out completely; did not make any difference, going to go back on whole pill; she thinks benefits outweight negatives  No more confusion; going back to July 3rd; would go back 4 hours Mon-Wed-Fri  Relevant past medical, surgical, family and social history reviewed Past Medical History  Diagnosis Date  . Vaginal enterocele   . High cholesterol   . GERD (gastroesophageal reflux disease)   . Exposure to hepatitis C   . Bacterial vaginitis   . Yeast infection   . Kidney disease   .  Urinary retention with incomplete bladder emptying   . Vaginal dryness, menopausal   . Increased BMI   . Hypokalemia   . Rectocele   . Anxiety and depression   . Cystocele   . Elevated fasting blood sugar   . Hypertension   . Vitamin D deficiency 12/03/2014  . Barrett's esophagus 04/07/2016   Past Surgical History  Procedure Laterality Date  . Thumb surgery    . Partial hysterectomy    . Appendectomy     Family History  Problem Relation Age of Onset  . Stroke Father    Social History  Substance Use Topics  . Smoking status: Former Research scientist (life sciences)  . Smokeless tobacco: None  . Alcohol Use: 0.0 oz/week    0 Standard drinks or equivalent per week   Interim medical history since last visit reviewed. Allergies and medications reviewed  Review of Systems Per HPI unless specifically indicated above     Objective:    BP 136/82 mmHg  Pulse 89  Temp(Src) 98.4 F (36.9 C) (Oral)  Resp 16  Wt 200 lb (90.719 kg)  SpO2 94%  Wt Readings from Last 3 Encounters:  04/14/16 200 lb (90.719 kg)  03/24/16 195 lb (88.451 kg)  03/23/16 195 lb (88.451 kg)    Physical Exam  Constitutional: She appears well-developed and well-nourished. No distress.  HENT:  Head: Normocephalic and atraumatic.  Eyes: EOM are normal. No scleral icterus.  Neck: No thyromegaly present.  Cardiovascular: Normal rate, regular rhythm and normal heart sounds.   No extrasystoles are present.  Pulmonary/Chest: Effort normal and breath sounds normal. No respiratory distress. She has no wheezes.  Abdominal: Soft. She exhibits no distension.  Musculoskeletal: Normal range of motion. She exhibits no edema (nonpitting edema both feet, ankles).  Neurological: She is alert. She exhibits normal muscle tone.  Skin: Skin is warm and dry. Rash (several excoriations and linear marks on arm and legs) noted. She is not diaphoretic. No pallor.  Psychiatric: She has a normal mood and affect. Her behavior is normal. Judgment and thought  content normal.   Results for orders placed or performed during the hospital encounter of 04/07/16  Potassium  Result Value Ref Range   Potassium 4.0 3.5 - 5.1 mmol/L      Assessment & Plan:   Problem List Items Addressed This Visit      Cardiovascular and Mediastinum   Hypertension goal BP (blood pressure) < 140/90    DASH guidelines        Endocrine   Acquired hypothyroidism    Last TSH in Feb normal        Musculoskeletal and Integument   Fibromyalgia    Daily yoga or tai chi; continue cymbalta        Other   Breast cancer screening    Order entered for mammogram      Relevant Orders   MM DIGITAL SCREENING BILATERAL   Colon cancer screening    Order entered for colonoscopy      Relevant Orders   Ambulatory referral to Gastroenterology   Heart palpitations    On beta-blocker, seeing cardiologist; keep f/u; checking electrolytes      Hypokalemia    Check BMP today; still trying to wean fluid pills      Relevant Orders   Basic Metabolic Panel (BMET)    Other Visit Diagnoses    Poison ivy dermatitis    -  Primary    explained mode of transmission, how to prevent spread; may use rx cream       Follow up plan: Return in about 2 months (around 06/14/2016) for follow-up.  An after-visit summary was printed and given to the patient at [place].  Please see the patient instructions which may contain other information and recommendations beyond what is mentioned above in the assessment and plan.  Meds ordered this encounter  Medications  . clobetasol cream (TEMOVATE) 0.05 %    Sig: Apply 1 application topically 2 (two) times daily.    Dispense:  30 g    Refill:  0   Orders Placed This Encounter  Procedures  . MM DIGITAL SCREENING BILATERAL  . Basic Metabolic Panel (BMET)  . Ambulatory referral to Gastroenterology

## 2016-04-14 NOTE — Assessment & Plan Note (Signed)
DASH guidelines 

## 2016-04-14 NOTE — Assessment & Plan Note (Signed)
Order entered for mammogram.

## 2016-04-14 NOTE — Assessment & Plan Note (Signed)
Order entered for colonoscopy.

## 2016-04-14 NOTE — Patient Instructions (Addendum)
Use the new cream for the poison ivy You cannot spread poison ivy once you have showered I'll suggest cognitive behavioral therapy for chronic pain and fibromyalgia, as well as yoga and/or tai chi Have labs done today Return to work on July 3rd, and work that first week just 4 hours a day on Monday, Wednesday, and Friday The next week at 4 hours a day, Monday through Friday (July 10th - July 14th) The following week will be 8 hours a day starting July 17th Return in 2 months Please do call to schedule your mammogram; the number to schedule one at either [facility] or [facility] is [phone] I've put in a referral for a colonoscopy Have labs done today If you have not heard anything from my staff in a week about any orders/referrals/studies from today, please contact us here to follow-up [phone]

## 2016-04-14 NOTE — Assessment & Plan Note (Signed)
Last TSH in Feb normal

## 2016-04-14 NOTE — Assessment & Plan Note (Signed)
Daily yoga or tai chi; continue cymbalta

## 2016-04-14 NOTE — Assessment & Plan Note (Signed)
On beta-blocker, seeing cardiologist; keep f/u; checking electrolytes

## 2016-05-05 ENCOUNTER — Ambulatory Visit: Payer: BLUE CROSS/BLUE SHIELD | Admitting: Family Medicine

## 2016-05-06 ENCOUNTER — Encounter: Payer: Self-pay | Admitting: Emergency Medicine

## 2016-05-06 ENCOUNTER — Emergency Department
Admission: EM | Admit: 2016-05-06 | Discharge: 2016-05-06 | Disposition: A | Discharge status: Home / Self Care | Payer: BLUE CROSS/BLUE SHIELD | Attending: Emergency Medicine | Admitting: Emergency Medicine

## 2016-05-06 ENCOUNTER — Ambulatory Visit (INDEPENDENT_AMBULATORY_CARE_PROVIDER_SITE_OTHER): Payer: BLUE CROSS/BLUE SHIELD | Admitting: Family Medicine

## 2016-05-06 ENCOUNTER — Emergency Department: Payer: BLUE CROSS/BLUE SHIELD

## 2016-05-06 ENCOUNTER — Encounter: Payer: Self-pay | Admitting: Family Medicine

## 2016-05-06 VITALS — BP 124/70 | HR 100 | Temp 98.5°F | Resp 16 | Ht 65.0 in | Wt 194.3 lb

## 2016-05-06 DIAGNOSIS — R112 Nausea with vomiting, unspecified: Secondary | ICD-10-CM | POA: Diagnosis not present

## 2016-05-06 DIAGNOSIS — R1011 Right upper quadrant pain: Secondary | ICD-10-CM | POA: Diagnosis present

## 2016-05-06 DIAGNOSIS — Z79899 Other long term (current) drug therapy: Secondary | ICD-10-CM | POA: Diagnosis not present

## 2016-05-06 DIAGNOSIS — E785 Hyperlipidemia, unspecified: Secondary | ICD-10-CM | POA: Diagnosis not present

## 2016-05-06 DIAGNOSIS — I7300 Raynaud's syndrome without gangrene: Secondary | ICD-10-CM

## 2016-05-06 DIAGNOSIS — I10 Essential (primary) hypertension: Secondary | ICD-10-CM | POA: Insufficient documentation

## 2016-05-06 DIAGNOSIS — Z87891 Personal history of nicotine dependence: Secondary | ICD-10-CM | POA: Insufficient documentation

## 2016-05-06 DIAGNOSIS — R509 Fever, unspecified: Secondary | ICD-10-CM

## 2016-05-06 DIAGNOSIS — R1013 Epigastric pain: Secondary | ICD-10-CM | POA: Diagnosis not present

## 2016-05-06 DIAGNOSIS — J4520 Mild intermittent asthma, uncomplicated: Secondary | ICD-10-CM | POA: Diagnosis not present

## 2016-05-06 DIAGNOSIS — E039 Hypothyroidism, unspecified: Secondary | ICD-10-CM | POA: Diagnosis not present

## 2016-05-06 DIAGNOSIS — R195 Other fecal abnormalities: Secondary | ICD-10-CM

## 2016-05-06 LAB — COMPREHENSIVE METABOLIC PANEL
ALBUMIN: 4.2 g/dL (ref 3.5–5.0)
ALK PHOS: 63 U/L (ref 38–126)
ALT: 18 U/L (ref 14–54)
AST: 21 U/L (ref 15–41)
Anion gap: 7 (ref 5–15)
BILIRUBIN TOTAL: 0.3 mg/dL (ref 0.3–1.2)
BUN: 17 mg/dL (ref 6–20)
CALCIUM: 9.2 mg/dL (ref 8.9–10.3)
CO2: 29 mmol/L (ref 22–32)
CREATININE: 0.86 mg/dL (ref 0.44–1.00)
Chloride: 99 mmol/L — ABNORMAL LOW (ref 101–111)
GFR calc Af Amer: >60 mL/min (ref >60)
GFR calc non Af Amer: >60 mL/min (ref >60)
GLUCOSE: 84 mg/dL (ref 65–99)
Potassium: 4.4 mmol/L (ref 3.5–5.1)
SODIUM: 135 mmol/L (ref 135–145)
TOTAL PROTEIN: 7.4 g/dL (ref 6.5–8.1)

## 2016-05-06 LAB — URINALYSIS COMPLETE WITH MICROSCOPIC (ARMC ONLY)
BILIRUBIN URINE: NEGATIVE
GLUCOSE, UA: NEGATIVE mg/dL
Hgb urine dipstick: NEGATIVE
KETONES UR: NEGATIVE mg/dL
Leukocytes, UA: NEGATIVE
NITRITE: NEGATIVE
PH: 5 (ref 5.0–8.0)
Protein, ur: NEGATIVE mg/dL
Specific Gravity, Urine: 1.008 (ref 1.005–1.030)

## 2016-05-06 LAB — CBC
HCT: 44.6 % (ref 35.0–47.0)
Hemoglobin: 15.1 g/dL (ref 12.0–16.0)
MCH: 28.9 pg (ref 26.0–34.0)
MCHC: 33.9 g/dL (ref 32.0–36.0)
MCV: 85.4 fL (ref 80.0–100.0)
PLATELETS: 350 10*3/uL (ref 150–440)
RBC: 5.22 MIL/uL — ABNORMAL HIGH (ref 3.80–5.20)
RDW: 14.1 % (ref 11.5–14.5)
WBC: 8.1 10*3/uL (ref 3.6–11.0)

## 2016-05-06 LAB — LIPASE, BLOOD: Lipase: 25 U/L (ref 11–51)

## 2016-05-06 MED ORDER — ONDANSETRON HCL 4 MG/2ML IJ SOLN
4.0000 mg | Freq: Once | INTRAMUSCULAR | Status: AC
  Administered 2016-05-06: 4 mg via INTRAVENOUS
  Filled 2016-05-06: qty 2

## 2016-05-06 MED ORDER — DIATRIZOATE MEGLUMINE & SODIUM 66-10 % PO SOLN: 15.0000 mL | Freq: Once | ORAL | Status: DC

## 2016-05-06 MED ORDER — SODIUM CHLORIDE 0.9 % IV BOLUS (SEPSIS)
1000.0000 mL | Freq: Once | INTRAVENOUS | Status: AC
  Administered 2016-05-06: 1000 mL via INTRAVENOUS

## 2016-05-06 MED ORDER — MORPHINE SULFATE (PF) 4 MG/ML IV SOLN
4.0000 mg | Freq: Once | INTRAVENOUS | Status: AC
  Administered 2016-05-06: 4 mg via INTRAVENOUS
  Filled 2016-05-06: qty 1

## 2016-05-06 MED ORDER — HYDROCODONE-ACETAMINOPHEN 5-325 MG PO TABS: 1.0000 | ORAL_TABLET | ORAL | Status: DC | PRN

## 2016-05-06 MED ORDER — IOPAMIDOL (ISOVUE-300) INJECTION 61%
100.0000 mL | Freq: Once | INTRAVENOUS | Status: AC | PRN
  Administered 2016-05-06: 100 mL via INTRAVENOUS

## 2016-05-06 NOTE — ED Provider Notes (Signed)
[facility] Emergency Department Provider Note  ____________________________________________  Time seen: Approximately 1:48 PM  I have reviewed the triage vital signs and the nursing notes.   HISTORY  Chief Complaint Abdominal Pain   HPI Laura [name] Seen is a 50 y.o. female with a history of Barrett's esophagitis, hypertension, hypokalemia, kidney stones, and GERD who presents for evaluation of right upper quadrant abdominal pain. Patient reports that the pain started 4 days ago, it is sharp, constant, located in the right upper quadrant, radiating to the epigastric region, associated with severe nausea and a few episodes of nonbloody nonbilious emesis. She reports the pain is exacerbated by eating. She currently has 7/10 pain. She denies ever having similar pain. She went to see her primary care doctor today who referred her to the emergency department to rule out gallbladder/ pancreas pathology as patient is on Saxenda which can cause pancreatitis. Patient also reports she has had a fever as high as 101.18F intermittently over the course of the last 6 days. She reports she hasn't tried anything at home for the pain. Patient denies chest pain, shortness of breath, personal or family history of blood clots, leg pain or swelling, recent travel or immobilization. She describes the pain as nonpleuritic. She denies cough and shortness of breath. She reports multiple kidney stones in the past although reports the pain this time feels different. She also reports that her stools have become clay colored.  Past Medical History  Diagnosis Date  . Vaginal enterocele   . High cholesterol   . GERD (gastroesophageal reflux disease)   . Exposure to hepatitis C   . Bacterial vaginitis   . Yeast infection   . Kidney disease   . Urinary retention with incomplete bladder emptying   . Vaginal dryness, menopausal   . Increased BMI   . Hypokalemia   . Rectocele   . Anxiety and  depression   . Cystocele   . Elevated fasting blood sugar   . Hypertension   . Vitamin D deficiency 12/03/2014  . Barrett's esophagus 04/07/2016    Patient Active Problem List   Diagnosis Date Noted  . Raynaud disease 05/06/2016  . Breast cancer screening 04/14/2016  . Colon cancer screening 04/14/2016  . Barrett's esophagus 04/07/2016  . Pterygium of left eye 03/24/2016  . Myalgia 03/02/2016  . SOB (shortness of breath) 02/19/2016  . Pain in the chest 02/19/2016  . Edema 02/19/2016  . Fibromyalgia 02/13/2016  . Confusion 02/06/2016  . Visual changes 02/06/2016  . Hypomagnesemia syndrome 01/05/2016  . Weakness 12/30/2015  . Shellfish allergy 12/30/2015  . Heart palpitations 12/16/2015  . Heart murmur 12/16/2015  . Hyperlipidemia LDL goal <100 11/19/2015  . GERD without esophagitis 06/13/2015  . 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 Hypokalemia 06/06/2015  . Bilateral lower extremity edema 06/06/2015  . Hot flashes due to surgical menopause 06/06/2015  . Chronic pain of multiple joints 06/06/2015  . Osteoarthrosis, generalized, multiple joints 06/06/2015  . Vaginal enterocele   . Urinary retention with incomplete bladder emptying   . Vaginal dryness, menopausal   . Obesity, Class I, BMI 30.0-34.9 (see actual BMI)   . Rectocele   . Cystocele   . Anxiety and depression   . Acquired hypothyroidism 12/03/2014  . Hypertension goal BP (blood pressure) < 140/90 12/03/2014  . Asthma, mild intermittent 12/03/2014  . Vitamin D deficiency 12/03/2014    Past Surgical History  Procedure Laterality Date  . Thumb surgery    .  Partial hysterectomy    . Appendectomy      Current Outpatient Rx  Name  Route  Sig  Dispense  Refill  . albuterol (PROVENTIL HFA;VENTOLIN HFA) 108 (90 Base) MCG/ACT inhaler   Inhalation   Inhale 2 puffs into the lungs every 4 (four) hours as needed for wheezing or shortness of breath.   1 Inhaler   1   . ALPRAZolam (XANAX) 1 MG tablet   Oral    Take 1 mg by mouth 4 (four) times daily.         Marland Kitchen amphetamine-dextroamphetamine (ADDERALL) 20 MG tablet            0   . atorvastatin (LIPITOR) 40 MG tablet   Oral   Take 0.5 tablets (20 mg total) by mouth daily.   45 tablet   0   . buPROPion (WELLBUTRIN XL) 150 MG 24 hr tablet            0   . clobetasol cream (TEMOVATE) 0.05 %   Topical   Apply 1 application topically 2 (two) times daily.   30 g   0   . diclofenac sodium (VOLTAREN) 1 % GEL   Topical   Apply topically as needed. Reported on 02/19/2016         . DULoxetine (CYMBALTA) 30 MG capsule   Oral   Take 3 capsules (90 mg total) by mouth daily. (stop the 60 mg strength) Patient taking differently: Take 90 mg by mouth daily. (stop the 60 mg strength)   270 capsule   1     Changing dose from 60 mg daily to 90 mg daily   . EPINEPHRINE 0.3 mg/0.3 mL IJ SOAJ injection   Intramuscular   Inject 0.3 mLs (0.3 mg total) into the muscle once.   2 Device   1     Dispense as written.   . furosemide (LASIX) 20 MG tablet      One tablet every morning, one tablet every afternoon - new instructions   60 tablet   0     New instructions; tapering her down   . glucose blood (ACCU-CHEK SMARTVIEW) test strip      Use as instructed to check blood glucose once a day and prn   50 each   1   . Insulin Pen Needle 32G X 6 MM MISC   Does not apply   1 Device by Does not apply route daily.   50 each   0   . levothyroxine (SYNTHROID, LEVOTHROID) 125 MCG tablet      TAKE ONE TABLET BY MOUTH ONCE DAILY   90 tablet   1   . magnesium oxide (MAG-OX) 400 MG tablet   Oral   Take 1 tablet (400 mg total) by mouth 2 (two) times daily.   60 tablet   2   . metoprolol succinate (TOPROL XL) 25 MG 24 hr tablet   Oral   Take 1 tablet (25 mg total) by mouth daily.   30 tablet   9   . pantoprazole (PROTONIX) 40 MG tablet   Oral   Take 1 tablet (40 mg total) by mouth daily. Caution:prolonged use may increase risk of  pneumonia, colitis, osteoporosis, anemia   30 tablet   5     Cancel the other Rx for pantoprazole from earlier  ...   . potassium chloride (K-DUR,KLOR-CON) 10 MEQ tablet      Two tablets by mouth in the morning, one tablet  in the afternoon   1 tablet   0     No Rx sent   . tiZANidine (ZANAFLEX) 4 MG capsule   Oral   Take 4 mg by mouth 3 (three) times daily as needed.         . valsartan (DIOVAN) 160 MG tablet   Oral   Take 1 tablet (160 mg total) by mouth daily.   90 tablet   3   . Vitamin D, Ergocalciferol, (DRISDOL) 50000 units CAPS capsule      TAKE ONE CAPSULE BY MOUTH ONCE A WEEK   12 capsule   0     Allergies Iodine; Meperidine; Shellfish allergy; and Mirtazapine  Family History  Problem Relation Age of Onset  . Stroke Father     Social History Social History  Substance Use Topics  . Smoking status: Former Research scientist (life sciences)  . Smokeless tobacco: None  . Alcohol Use: 0.0 oz/week    0 Standard drinks or equivalent per week    Review of Systems  Constitutional: Negative for fever. Eyes: Negative for visual changes. ENT: Negative for sore throat. Cardiovascular: Negative for chest pain. Respiratory: Negative for shortness of breath. Gastrointestinal: + Right upper quadrant abdominal pain, nausea and vomiting. + Clay-colored stools. No diarrhea. Genitourinary: Negative for dysuria. Musculoskeletal: Negative for back pain. Skin: Negative for rash. Neurological: Negative for headaches, weakness or numbness.  ____________________________________________   PHYSICAL EXAM:  VITAL SIGNS: ED Triage Vitals  Enc Vitals Group     BP 05/06/16 1209 108/68 mmHg     Pulse Rate 05/06/16 1209 91     Resp 05/06/16 1209 18     Temp 05/06/16 1209 97.9 F (36.6 C)     Temp Source 05/06/16 1209 Oral     SpO2 05/06/16 1209 97 %     Weight 05/06/16 1209 194 lb (87.998 kg)     Height 05/06/16 1209 5\' 5"  (1.651 m)     Head Cir --      Peak Flow --      Pain Score  05/06/16 1209 5     Pain Loc --      Pain Edu? --      Excl. in [place]? --     Constitutional: Alert and oriented. Well appearing and in no apparent distress. HEENT:      Head: Normocephalic and atraumatic.         Eyes: Conjunctivae are normal. Sclera is non-icteric. EOMI. PERRL      Mouth/Throat: Mucous membranes are moist.       Neck: Supple with no signs of meningismus. Cardiovascular: Regular rate and rhythm. No murmurs, gallops, or rubs. 2+ symmetrical distal pulses are present in all extremities. No JVD. Respiratory: Normal respiratory effort. Lungs are clear to auscultation bilaterally. No wheezes, crackles, or rhonchi.  Gastrointestinal: Soft, Patient is tender to palpation on the right upper quadrant and epigastric regions,  non distended with positive bowel sounds. No rebound or guarding. Genitourinary: No suprapubic tenderness. No CVA tenderness. Musculoskeletal: Nontender with normal range of motion in all extremities. No edema, cyanosis, or erythema of extremities. Neurologic: Normal speech and language. Face is symmetric. Moving all extremities. No gross focal neurologic deficits are appreciated. Skin: Skin is warm, dry and intact. No rash noted. Psychiatric: Mood and affect are normal. Speech and behavior are normal.  ____________________________________________   LABS (all labs ordered are listed, but only abnormal results are displayed)  Labs Reviewed  COMPREHENSIVE METABOLIC PANEL - Abnormal; Notable for the following:  Chloride 99 (*)    All other components within normal limits  CBC - Abnormal; Notable for the following:    RBC 5.22 (*)    All other components within normal limits  URINALYSIS COMPLETEWITH MICROSCOPIC ([facility] ONLY) - Abnormal; Notable for the following:    Color, Urine YELLOW (*)    APPearance CLEAR (*)    Bacteria, UA RARE (*)    Squamous Epithelial / LPF 6-30 (*)    All other components within normal limits  LIPASE, BLOOD    ____________________________________________  EKG  ED ECG REPORT  I, Rudene Re, the attending physician, personally viewed and interpreted this ECG.  Normal sinus rhythm, rate of 83, normal intervals, normal axis, no ST elevations or depressions. Unchanged from prior.      ____________________________________________  RADIOLOGY  CXR: no acute pathology RUQ [place]: WNL ____________________________________________   PROCEDURES  Procedure(s) performed: None Critical Care performed:  None ____________________________________________   INITIAL IMPRESSION / ASSESSMENT AND PLAN / ED COURSE  50 y.o. female with a history of Barrett's esophagitis, hypertension, hypokalemia, kidney stones, and GERD who presents for evaluation of right upper quadrant abdominal pain present for the last 4 days worse postprandially, associated with Clay-colored stools, nausea and vomiting, and intermittent fever. Patient is well appearing, in no distress, her vital signs are within normal limits, her abdomen is soft with tenderness to palpation on the right upper quadrant and epigastric regions, no rebound or guarding. Labs within normal limits with normal white count of 8.1, normal LFTs, normal alkaline phosphatase, normal T bili, normal lipase, normal UA. Based on these labs low Suspicion for gallbladder pathology however since patient is tender will send her for a right upper quadrant ultrasound. We'll also get a chest x-ray to rule out for possible right lower lobe pathology. Pain is nonpleuritic, patient has no shortness of breath, no hormone use, no prior history of PE or DVT, no recent immobilization or surgery, no hemoptysis, no leg swelling, normal vital signs making PE extremely less likely.   ----------------------------------------- 4:03 PM on 05/06/2016 -----------------------------------------  Patient's ultrasound was negative. Patient continues to have tenderness in the right upper  quadrant and epigastric region and is actively vomiting. Will order CT scan to rule out any other intra-abdominal pathology. Zofran ordered. Care transferred to Dr. Kerman Passey.  Pertinent labs & imaging results that were available during my care of the patient were reviewed by me and considered in my medical decision making (see chart for details).    ____________________________________________   FINAL CLINICAL IMPRESSION(S) / ED DIAGNOSES  Final diagnoses:  RUQ abdominal pain      NEW MEDICATIONS STARTED DURING THIS VISIT:  New Prescriptions   No medications on file     Note:  This document was prepared using Dragon voice recognition software and may include unintentional dictation errors.    Rudene Re, MD 05/06/16 [phone]

## 2016-05-06 NOTE — ED Notes (Signed)
Sent by PCP for eval of RUQ abd pain 4 days , vomiting and nauseated, yesterday fever 101.6

## 2016-05-06 NOTE — Discharge Instructions (Signed)

## 2016-05-06 NOTE — Progress Notes (Signed)
BP 124/70 mmHg  Pulse 100  Temp(Src) 98.5 F (36.9 C) (Oral)  Resp 16  Ht 5\' 5"  (1.651 m)  Wt 194 lb 4.8 oz (88.134 kg)  BMI 32.33 kg/m2  SpO2 93%   Subjective:    Patient ID: Laura [name], female    DOB: [birth year], 50 y.o.   MRN: [id number]  HPI: Laura [name] is a 50 y.o. female  Chief Complaint  Patient presents with  . Obesity    6 week recheck on Saxenda. Having side effects from meds   She is having terrible pain for 3-4 days, mostly under the RUQ and epigastric area; sharp pain She has never had pancreatitis; also occasionally gets sharp pain in the midline Stool has been a funny color, kind of grey; 3-4 days She has been on Saxenda for 5-6 weeks, currently on 3 mg daily Has had a low grade fever, off and on, but was 101.6 last night Appetite has been okay She has lost 6 pounds since starting the Saxenda She vomited all morning yesterday; still nauseated but no vomiting today  Feet and hands have been turning blue; hands turn red; worse when cold; under a lot of stress; mother-in-law just had open heart surgery, valve replacement; just lost her job too; not sleeping okay; not stress eating  Relevant past medical, surgical, family and social history reviewed Past Medical History  Diagnosis Date  . Vaginal enterocele   . High cholesterol   . GERD (gastroesophageal reflux disease)   . Exposure to hepatitis C   . Bacterial vaginitis   . Yeast infection   . Kidney disease   . Urinary retention with incomplete bladder emptying   . Vaginal dryness, menopausal   . Increased BMI   . Hypokalemia   . Rectocele   . Anxiety and depression   . Cystocele   . Elevated fasting blood sugar   . Hypertension   . Vitamin D deficiency 12/03/2014  . Barrett's esophagus 04/07/2016   Past Surgical History  Procedure Laterality Date  . Thumb surgery    . Partial hysterectomy    . Appendectomy     Family History  Problem Relation Age of Onset  . Stroke Father    Social  History  Substance Use Topics  . Smoking status: Former Research scientist (life sciences)  . Smokeless tobacco: None  . Alcohol Use: 0.0 oz/week    0 Standard drinks or equivalent per week   Interim medical history since last visit reviewed. Allergies and medications reviewed  Review of Systems  Gastrointestinal: Positive for nausea, vomiting, abdominal pain, diarrhea (loose and clay colored) and abdominal distention (after eating, in the epigastric region). Negative for blood in stool.  Skin:       Rash on abdomen, going on for 1 week; using steroid cream   Per HPI unless specifically indicated above     Objective:    BP 124/70 mmHg  Pulse 100  Temp(Src) 98.5 F (36.9 C) (Oral)  Resp 16  Ht 5\' 5"  (1.651 m)  Wt 194 lb 4.8 oz (88.134 kg)  BMI 32.33 kg/m2  SpO2 93%  Wt Readings from Last 3 Encounters:  05/06/16 194 lb 4.8 oz (88.134 kg)  04/14/16 200 lb (90.719 kg)  03/24/16 195 lb (88.451 kg)    Physical Exam  Constitutional: She appears well-developed and well-nourished.  Non-toxic appearance. No distress.  Cardiovascular: Regular rhythm.   No extrasystoles are present. Tachycardia present.   Pulses:      Dorsalis pedis  pulses are 2+ on the right side, and 2+ on the left side.  borderiine tachy  Pulmonary/Chest: Breath sounds normal.  Abdominal: Bowel sounds are normal. She exhibits no distension. There is no hepatosplenomegaly. There is tenderness. There is positive Murphy's sign. There is no guarding. No hernia.  Slightly bloated appearance today; tender in the RUQ, epigastrium; positive Murphy's sign  Neurological: She is alert.    Results for orders placed or performed during the hospital encounter of 0000000  Basic metabolic panel  Result Value Ref Range   Sodium 140 135 - 145 mmol/L   Potassium 4.1 3.5 - 5.1 mmol/L   Chloride 103 101 - 111 mmol/L   CO2 29 22 - 32 mmol/L   Glucose, Bld 90 65 - 99 mg/dL   BUN 14 6 - 20 mg/dL   Creatinine, Ser 0.80 0.44 - 1.00 mg/dL   Calcium 9.5 8.9  - 10.3 mg/dL   GFR calc non Af Amer >60 >60 mL/min   GFR calc Af Amer >60 >60 mL/min   Anion gap 8 5 - 15      Assessment & Plan:   Problem List Items Addressed This Visit      Cardiovascular and Mediastinum   Raynaud disease    Other Visit Diagnoses    Epigastric pain    -  Primary    positive Murphy's sign; on Saxenda; risk of pancreatitis, gallbladder disease; STOP Saxenda and send to ER for evaluation    Clay-colored stools        concerning for biliary tract obstruction; sending to ER    Non-intractable vomiting with nausea, vomiting of unspecified type        Fever and chills        101.6 at home; sending patient to ER for labs and evaluation       Follow up plan: No Follow-up on file.  An after-visit summary was printed and given to the patient at [place].  Please see the patient instructions which may contain other information and recommendations beyond what is mentioned above in the assessment and plan.  Medications Discontinued During This Encounter  Medication Reason  . Liraglutide -Weight Management (SAXENDA) 18 MG/3ML SOPN Discontinued by provider

## 2016-05-06 NOTE — ED Provider Notes (Signed)
ED ECG REPORT I, Rudene Re, the attending physician, personally viewed and interpreted this ECG.  Normal sinus rhythm, rate of 83, normal intervals, normal axis, no ST elevations or depressions. Unable to visualize old EKG.  Rudene Re, MD 05/06/16 1218

## 2016-05-06 NOTE — ED Notes (Signed)
Patient completed contrast.  Radiology called and informed she is ready for CT.

## 2016-05-06 NOTE — ED Notes (Signed)
Patient transported to Ultrasound 

## 2016-05-06 NOTE — Patient Instructions (Addendum)
STOP the Saxenda immediatey and forever  GO to the ER now, to be evaluated for possible pancreatitis and/or gallbladder disease   Raynaud Phenomenon Raynaud phenomenon is a condition that affects the blood vessels (arteries) that carry blood to your fingers and toes. The arteries that supply blood to your ears or the tip of your nose might also be affected. Raynaud phenomenon causes the arteries to temporarily narrow. As a result, the flow of blood to the affected areas is temporarily decreased. This usually occurs in response to cold temperatures or stress. During an attack, the skin in the affected areas turns white. You may also feel tingling or numbness in those areas. Attacks usually last for only a brief period, and then the blood flow to the area returns to normal. In most cases, Raynaud phenomenon does not cause serious health problems. CAUSES  For many people with this condition, the cause is not known. Raynaud phenomenon is sometimes associated with other diseases, such as scleroderma or lupus. RISK FACTORS Raynaud phenomenon can affect anyone, but it develops most often in people who are 72-40 years old. It affects more females than males. SIGNS AND SYMPTOMS Symptoms of Raynaud phenomenon may occur when you are exposed to cold temperatures or when you have emotional stress. The symptoms may last for a few minutes or up to several hours. They usually affect your fingers but may also affect your toes, ears, or the tip of your nose. Symptoms may include:  Changes in skin color. The skin in the affected areas will turn pale or white. The skin may then change from white to bluish to red as normal blood flow returns to the area.  Numbness, tingling, or pain in the affected areas. In severe cases, sores may develop in the affected areas.  DIAGNOSIS  Your health care provider will do a physical exam and take your medical history. You may be asked to put your hands in cold water to check for a  reaction to cold temperature. Blood tests may be done to check for other diseases or conditions. Your health care provider may also order a test to check the movement of blood through your arteries and veins (vascular ultrasound). TREATMENT  Treatment often involves making lifestyle changes and taking steps to control your exposure to cold temperatures. For more severe cases, medicine (calcium channel blockers) may be used to improve blood flow. Surgery is sometimes done to block the nerves that control the affected arteries, but this is rare. HOME CARE INSTRUCTIONS   Avoid exposure to cold by taking these steps:  If possible, stay indoors during cold weather.  When you go outside during cold weather, dress in layers and wear mittens, a hat, a scarf, and warm footwear.  Wear mittens or gloves when handling ice or frozen food.  Use holders for glasses or cans containing cold drinks.  Let warm water run for a while before taking a shower or bath.  Warm up the car before driving in cold weather.  If possible, avoid stressful and emotional situations. Exercise, meditation, and yoga may help you cope with stress. Biofeedback may be useful.  Do not use any tobacco products, including cigarettes, chewing tobacco, or electronic cigarettes. If you need help quitting, ask your health care provider.  Avoid secondhand smoke.  Limit your use of caffeine. Switch to decaffeinated coffee, tea, and soda. Avoid chocolate.  Wear loose fitting socks and comfortable, roomy shoes.  Avoid vibrating tools and machinery.  Take medicines only as directed  by your health care provider. SEEK MEDICAL CARE IF:   Your discomfort becomes worse despite lifestyle changes.  You develop sores on your fingers or toes that do not heal.  Your fingers or toes turn black.  You have breaks in the skin on your fingers or toes.  You have a fever.  You have pain or swelling in your joints.  You have a rash.  Your  symptoms occur on only one side of your body.   This information is not intended to replace advice given to you by your health care provider. Make sure you discuss any questions you have with your health care provider.   Document Released: 10/15/2000 Document Revised: 11/08/2014 Document Reviewed: 05/23/2014 Elsevier Interactive Patient Education [employer].

## 2016-05-06 NOTE — ED Provider Notes (Signed)
-----------------------------------------   6:19 PM on 05/06/2016 -----------------------------------------  CT scan has resulted in largely within normal limits besides esophageal thickening, the patient states she has a history of Barrett's esophagus. With normal lab results, normal ultrasound and a largely normal CT scan we will discharge the patient home. I will prescribe a short course of pain medication, and she will also take s over-the-counter Maalox which I discussed with the patient. Patient follow-up with GI medicine area to discuss return precautions, patient is agreeable.  Harvest Dark, MD 05/06/16 Tresa Moore

## 2016-05-11 ENCOUNTER — Other Ambulatory Visit: Payer: Self-pay

## 2016-05-11 ENCOUNTER — Other Ambulatory Visit: Payer: Self-pay | Admitting: Family Medicine

## 2016-05-11 MED ORDER — LEVOTHYROXINE SODIUM 125 MCG PO TABS: 125.0000 ug | ORAL_TABLET | Freq: Every day | ORAL | Status: DC

## 2016-05-11 NOTE — Telephone Encounter (Signed)
Last TSH reviewed, rx approved

## 2016-05-14 ENCOUNTER — Encounter: Payer: Self-pay | Admitting: Family Medicine

## 2016-05-18 ENCOUNTER — Encounter: Payer: Self-pay | Admitting: Internal Medicine

## 2016-05-21 ENCOUNTER — Telehealth: Payer: Self-pay | Admitting: Cardiology

## 2016-05-21 NOTE — Telephone Encounter (Signed)
Pt calling stating she was put on Metoprolol for irregular heart beat  But she is calling stating it does not seem to be working. Would like to know what can be done Please advise.

## 2016-05-24 MED ORDER — METOPROLOL SUCCINATE ER 50 MG PO TB24: 50.0000 mg | ORAL_TABLET | Freq: Every day | ORAL | 3 refills | Status: DC

## 2016-05-24 NOTE — Telephone Encounter (Signed)
Patient reports that she continues to have very prominent palpitations. Her blood pressure this AM was 111/63. Reviewed Dr. Tora Kindred recommendations to increase metoprolol ER to 50 mg Once daily. She agreed with this plan and verbalized understanding to monitor her blood pressure closely and monitor for symptoms. She wants to try this to see if it helps. She verbalized understanding of our conversation, agreement with plan of care, and denies any further questions at this time.

## 2016-05-24 NOTE — Telephone Encounter (Signed)
If patient continues to have significant palpitations, we can increase Metoprolol ER to 50mg  po qd but need to monitor BP and make sure it remains > [id number] systolic and that she is not symptomatic with lightheadedness/dizziness, etc. If she decides to go this route, rec ffup in 1 month. Otherwise, if she would like, she can come in for a visit to discuss.

## 2016-05-25 ENCOUNTER — Encounter: Payer: Self-pay | Admitting: Family Medicine

## 2016-05-25 ENCOUNTER — Ambulatory Visit (INDEPENDENT_AMBULATORY_CARE_PROVIDER_SITE_OTHER): Payer: BLUE CROSS/BLUE SHIELD | Admitting: Family Medicine

## 2016-05-25 ENCOUNTER — Ambulatory Visit
Admission: RE | Admit: 2016-05-25 | Discharge: 2016-05-25 | Disposition: A | Discharge status: Home / Self Care | Payer: BLUE CROSS/BLUE SHIELD | Source: Ambulatory Visit | Attending: Family Medicine | Admitting: Family Medicine

## 2016-05-25 VITALS — BP 124/82 | HR 92 | Temp 98.2°F | Resp 14 | Wt 195.0 lb

## 2016-05-25 DIAGNOSIS — M159 Polyosteoarthritis, unspecified: Secondary | ICD-10-CM

## 2016-05-25 DIAGNOSIS — K22719 Barrett's esophagus with dysplasia, unspecified: Secondary | ICD-10-CM | POA: Diagnosis not present

## 2016-05-25 DIAGNOSIS — M2550 Pain in unspecified joint: Secondary | ICD-10-CM | POA: Diagnosis not present

## 2016-05-25 DIAGNOSIS — Z1231 Encounter for screening mammogram for malignant neoplasm of breast: Secondary | ICD-10-CM | POA: Diagnosis present

## 2016-05-25 DIAGNOSIS — M797 Fibromyalgia: Secondary | ICD-10-CM | POA: Diagnosis not present

## 2016-05-25 DIAGNOSIS — I10 Essential (primary) hypertension: Secondary | ICD-10-CM

## 2016-05-25 DIAGNOSIS — Z1239 Encounter for other screening for malignant neoplasm of breast: Secondary | ICD-10-CM | POA: Insufficient documentation

## 2016-05-25 DIAGNOSIS — Z23 Encounter for immunization: Secondary | ICD-10-CM

## 2016-05-25 DIAGNOSIS — G8929 Other chronic pain: Secondary | ICD-10-CM | POA: Diagnosis not present

## 2016-05-25 NOTE — Assessment & Plan Note (Signed)
Refer to pain clinic? 

## 2016-05-25 NOTE — Assessment & Plan Note (Signed)
controlled 

## 2016-05-25 NOTE — Progress Notes (Signed)
BP 124/82   Pulse 92   Temp 98.2 F (36.8 C) (Oral)   Resp 14   Wt 195 lb (88.5 kg)   SpO2 95%   BMI 32.45 kg/m    Subjective:    Patient ID: Laura [name], female    DOB: September 09, 1966, 50 y.o.   MRN: [id number]  HPI: Laura [name] is a 50 y.o. female  Chief Complaint  Patient presents with  . ER follow-up    Stomach pain, due to inflammed esophagus due to saxenda   She was fired from her job; happened the day she went back; not looking for anything else right now She had back pain, could not stand to get the dishes done; just walks around her house and cries Seeing psychiatrist, Dr. Edwyna Perfect, in [place]; she just put her on Lyrica and she thinks that is going to help her some; husband says that she did not moan in pain during the night the last two nights after starting it She has not been to a pain clinic; taking tramadol but it does not change anything, prescribed by rheumatologist, has inflammation of the joints; seeing rheum at [facility] but would like to see someone closer so she doesn't have to drive Psychiatrist is looking at what she's going through, helping her; patient is not sure she can do much at this point; she is in pain 100% of the time; can't do things to take care of her own household, then the depression hits She is upset about disability decision; she says she cannot work  She was in the ER and is here for f/u; she was having stomach pains and was concerned about possible pancreatitis over the Saxenda; her esophagus was badly inflamed at the bottom; that was from the [place]; she stopped the Saxenda and within a few days, it started to feel better; on omeprazole, continuous gnawing sensation while on omeprazole, that has resolved not that she is off of omeprazole; she is on protonix; she saw the gastroenterologist; has Barrett's esophagus; grandfather died from esophageal cancer; mother has GERD too; no blood in the stools; watches for that  CT scan of abd, pelvis May 06, 2016: IMPRESSION: No acute intra-abdominal intrapelvic abnormalities.  Questionable minimal wall thickening of distal esophagus, can be seen with esophagitis and reflux disease.  Electronically Signed   By: Lavonia Dana M.D.   On: 05/06/2016 17:17  [place] of RUQ May 06, 2016: IMPRESSION: Normal right upper quadrant ultrasound.  Electronically Signed   By: Kathreen Devoid   On: 05/06/2016 15:05  Potassium is normal; patient is very pleased about that; she had suffered for years with abnormal potassium levels; we have weaned down her fluid pills over the last 3-4 months; she is still having swelling in the right foot; has to try to elevate foot over hip or wear compression stocking  HIV testing already done; negative, hepatitis C testing already done, negative; had it done more than once  Depression screen [facility] 2/9 05/25/2016 03/24/2016 02/13/2016 02/02/2016 11/19/2015  Decreased Interest 0 3 0 0 0  Down, Depressed, Hopeless 3 2 1 1  0  PHQ - 2 Score 3 5 1 1  0  Altered sleeping 3 2 3  - -  Tired, decreased energy 3 3 3  - -  Change in appetite 2 0 0 - -  Feeling bad or failure about yourself  3 2 0 - -  Trouble concentrating 3 3 1  - -  Moving slowly or fidgety/restless 0  0 0 - -  Suicidal thoughts 0 0 0 - -  PHQ-9 Score 17 15 8  - -  Difficult doing work/chores Extremely dIfficult Somewhat difficult Very difficult - -   Relevant past medical, surgical, family and social history reviewed Past Medical History:  Diagnosis Date  . Anxiety and depression   . Bacterial vaginitis   . Barrett's esophagus 04/07/2016  . Cystocele   . Elevated fasting blood sugar   . Exposure to hepatitis C   . GERD (gastroesophageal reflux disease)   . High cholesterol   . Hypertension   . Hypokalemia   . Increased BMI   . Kidney disease   . Rectocele   . Urinary retention with incomplete bladder emptying   . Vaginal dryness, menopausal   . Vaginal enterocele   . Vitamin D deficiency 12/03/2014  . Yeast  infection    Past Surgical History:  Procedure Laterality Date  . ABDOMINAL HYSTERECTOMY    . APPENDECTOMY    . PARTIAL HYSTERECTOMY    . thumb surgery     Family History  Problem Relation Age of Onset  . Stroke Father   . Breast cancer Mother 40   Social History  Substance Use Topics  . Smoking status: Former Research scientist (life sciences)  . Smokeless tobacco: Not on file  . Alcohol use 0.0 oz/week   Interim medical history since last visit reviewed. Allergies and medications reviewed  Review of Systems Per HPI unless specifically indicated above     Objective:    BP 124/82   Pulse 92   Temp 98.2 F (36.8 C) (Oral)   Resp 14   Wt 195 lb (88.5 kg)   SpO2 95%   BMI 32.45 kg/m   Wt Readings from Last 3 Encounters:  05/25/16 195 lb (88.5 kg)  05/06/16 194 lb (88 kg)  05/06/16 194 lb 4.8 oz (88.1 kg)    Physical Exam  Constitutional: She appears well-developed and well-nourished. No distress.  Cardiovascular: Normal rate and regular rhythm.   Pulmonary/Chest: Effort normal and breath sounds normal.  Musculoskeletal: She exhibits edema (right pedal edema).  Neurological: She is alert.  Psychiatric: Her mood appears anxious. Her affect is angry. Her speech is not rapid and/or pressured. She does not express impulsivity or inappropriate judgment. She exhibits a depressed mood.  Good eye contact with examiner; upset and angry but not hostile or ugly; cried during part of the visit, visible frustration; not inconsolable or distraught; future-oriented comments   Results for orders placed or performed during the hospital encounter of 05/06/16  Lipase, blood  Result Value Ref Range   Lipase 25 11 - 51 U/L  Comprehensive metabolic panel  Result Value Ref Range   Sodium 135 135 - 145 mmol/L   Potassium 4.4 3.5 - 5.1 mmol/L   Chloride 99 (L) 101 - 111 mmol/L   CO2 29 22 - 32 mmol/L   Glucose, Bld 84 65 - 99 mg/dL   BUN 17 6 - 20 mg/dL   Creatinine, Ser 0.86 0.44 - 1.00 mg/dL   Calcium 9.2  8.9 - 10.3 mg/dL   Total Protein 7.4 6.5 - 8.1 g/dL   Albumin 4.2 3.5 - 5.0 g/dL   AST 21 15 - 41 U/L   ALT 18 14 - 54 U/L   Alkaline Phosphatase 63 38 - 126 U/L   Total Bilirubin 0.3 0.3 - 1.2 mg/dL   GFR calc non Af Amer >60 >60 mL/min   GFR calc Af Amer >60 >60 mL/min  Anion gap 7 5 - 15  CBC  Result Value Ref Range   WBC 8.1 3.6 - 11.0 K/uL   RBC 5.22 (H) 3.80 - 5.20 MIL/uL   Hemoglobin 15.1 12.0 - 16.0 g/dL   HCT 44.6 35.0 - 47.0 %   MCV 85.4 80.0 - 100.0 fL   MCH 28.9 26.0 - 34.0 pg   MCHC 33.9 32.0 - 36.0 g/dL   RDW 14.1 11.5 - 14.5 %   Platelets 350 150 - 440 K/uL  Urinalysis complete, with microscopic  Result Value Ref Range   Color, Urine YELLOW (A) YELLOW   APPearance CLEAR (A) CLEAR   Glucose, UA NEGATIVE NEGATIVE mg/dL   Bilirubin Urine NEGATIVE NEGATIVE   Ketones, ur NEGATIVE NEGATIVE mg/dL   Specific Gravity, Urine 1.008 1.005 - 1.030   Hgb urine dipstick NEGATIVE NEGATIVE   pH 5.0 5.0 - 8.0   Protein, ur NEGATIVE NEGATIVE mg/dL   Nitrite NEGATIVE NEGATIVE   Leukocytes, UA NEGATIVE NEGATIVE   RBC / HPF 0-5 0 - 5 RBC/hpf   WBC, UA 0-5 0 - 5 WBC/hpf   Bacteria, UA RARE (A) NONE SEEN   Squamous Epithelial / LPF 6-30 (A) NONE SEEN   Mucous PRESENT    Budding Yeast PRESENT       Assessment & Plan:   Problem List Items Addressed This Visit      Cardiovascular and Mediastinum   Hypertension goal BP (blood pressure) < 140/90    controlled        Digestive   Barrett's esophagus    Saw GI, needs to be on PPI for life per patient        Musculoskeletal and Integument   Osteoarthrosis, generalized, multiple joints    Refer to pain clinic      Relevant Medications   traMADol (ULTRAM) 50 MG tablet   Other Relevant Orders   Ambulatory referral to Pain Clinic   Ambulatory referral to Rheumatology   Fibromyalgia    Refer to pain clinic      Relevant Orders   Ambulatory referral to Pain Clinic   Ambulatory referral to Rheumatology     Other    Chronic pain of multiple joints - Primary    Has been seen by [facility] and [facility] rheumatology; refer to pain clinic for management of chronic, daily pain      Relevant Medications   LYRICA 100 MG capsule   traZODone (DESYREL) 100 MG tablet   traMADol (ULTRAM) 50 MG tablet   Other Relevant Orders   Ambulatory referral to Pain Clinic   Ambulatory referral to Rheumatology    Other Visit Diagnoses    Need for Tdap vaccination       Relevant Orders   Tdap vaccine greater than or equal to 7yo IM (Completed)      Follow up plan: Return in about 2 months (around 07/26/2016).  An after-visit summary was printed and given to the patient at [place].  Please see the patient instructions which may contain other information and recommendations beyond what is mentioned above in the assessment and plan.  Meds ordered this encounter  Medications  . LYRICA 100 MG capsule    Refill:  0  . traZODone (DESYREL) 100 MG tablet    Sig: Take 100 mg by mouth.  . traMADol (ULTRAM) 50 MG tablet    Sig: Take 50 mg by mouth as needed.    Orders Placed This Encounter  Procedures  . Tdap vaccine greater than  or equal to 7yo IM  . Ambulatory referral to Pain Clinic  . Ambulatory referral to Rheumatology

## 2016-05-25 NOTE — Assessment & Plan Note (Signed)
Saw GI, needs to be on PPI for life per patient

## 2016-05-25 NOTE — Assessment & Plan Note (Signed)
Has been seen by [facility] Distance and [facility] rheumatology; refer to pain clinic for management of chronic, daily pain

## 2016-05-25 NOTE — Patient Instructions (Addendum)
I've put in referrals for rheumatology and the pain clinic Please do call to schedule your mammogram; the number to schedule one at either [facility] or [facility] is [phone] You received the vaccine to protect against tetanus and diphtheria and pertussis today; the tetanus and diphtheria portions will provide protection up to ten years, and the pertussis component will give you protection against whooping cough for life Caution: prolonged use of proton pump inhibitors like omeprazole (Prilosec), pantoprazole (Protonix), esomeprazole (Nexium), and others like Dexilant and Aciphex may increase your risk of pneumonia, Clostridium difficile colitis, osteoporosis, anemia and other health complications Try to limit or avoid triggers like coffee, caffeinated beverages, onions, chocolate, spicy foods, peppermint, acid foods like pizza, spaghetti sauce, and orange juice Lose weight if you are overweight or obese Try elevating the head of your bed by placing a small wedge between your mattress and box springs to keep acid in the stomach at night instead of coming up into your esophagus

## 2016-05-26 ENCOUNTER — Other Ambulatory Visit: Payer: Self-pay | Admitting: Family Medicine

## 2016-05-26 DIAGNOSIS — E876 Hypokalemia: Secondary | ICD-10-CM

## 2016-05-26 MED ORDER — POTASSIUM CHLORIDE CRYS ER 10 MEQ PO TBCR: EXTENDED_RELEASE_TABLET | ORAL | 1 refills | Status: DC

## 2016-06-21 ENCOUNTER — Telehealth: Payer: Self-pay

## 2016-06-21 NOTE — Telephone Encounter (Signed)
Form is ready.

## 2016-06-21 NOTE — Telephone Encounter (Signed)
[employer] for disability sent a letter by fax and mail, wants to make sure you received and when they will be expecting a response.

## 2016-06-22 NOTE — Progress Notes (Signed)
Cardiology Office Note   Date:  06/23/2016   ID:  [name], DOB January 11, 1966, MRN [id number]  Referring Doctor:  Enid Derry, MD   Cardiologist:   Wende Bushy, MD   Reason for consultation:  Chief Complaint  Patient presents with  . Other    3 month follow up. Pt. c/o decreased blood pressure and has frequent palpitations.       History of Present Illness: Laura [name] is a 50 y.o. female who presents for Follow-up after tests.   In terms of palpitations, she continues to have episodes. Episodes are similar to prior ones.  In terms of the shortness of breath, she experiences this with the palpitations.   Despite trying the higher dose of metoprolol, she did not notice any improvement with the palpitations. She noticed her blood pressure drop to the 80s over 50s.  Otherwise, patient denies headache, fever, cough, colds, abdominal pain, orthopnea, PND.  ROS:  Please see the history of present illness. Aside from mentioned under HPI, all other systems are reviewed and negative.    Past Medical History:  Diagnosis Date  . Anxiety and depression   . Bacterial vaginitis   . Barrett's esophagus 04/07/2016  . Cystocele   . Elevated fasting blood sugar   . Exposure to hepatitis C   . GERD (gastroesophageal reflux disease)   . High cholesterol   . Hypertension   . Hypokalemia   . Increased BMI   . Kidney disease   . Rectocele   . Urinary retention with incomplete bladder emptying   . Vaginal dryness, menopausal   . Vaginal enterocele   . Vitamin D deficiency 12/03/2014  . Yeast infection     Past Surgical History:  Procedure Laterality Date  . ABDOMINAL HYSTERECTOMY    . APPENDECTOMY    . PARTIAL HYSTERECTOMY    . thumb surgery       reports that she has quit smoking. She has never used smokeless tobacco. She reports that she drinks alcohol. She reports that she does not use drugs.   family history includes Breast cancer (age of onset: 57) in her mother;  Stroke in her father.   Current Outpatient Prescriptions  Medication Sig Dispense Refill  . albuterol (PROVENTIL HFA;VENTOLIN HFA) 108 (90 Base) MCG/ACT inhaler Inhale 2 puffs into the lungs every 4 (four) hours as needed for wheezing or shortness of breath. 1 Inhaler 1  . ALPRAZolam (XANAX) 1 MG tablet Take 1 mg by mouth 4 (four) times daily.    Marland Kitchen amphetamine-dextroamphetamine (ADDERALL) 20 MG tablet   0  . atorvastatin (LIPITOR) 40 MG tablet Take 0.5 tablets (20 mg total) by mouth daily. 45 tablet 0  . buPROPion (WELLBUTRIN XL) 150 MG 24 hr tablet   0  . clobetasol cream (TEMOVATE) [id number] % Apply 1 application topically 2 (two) times daily. 30 g 0  . diclofenac sodium (VOLTAREN) 1 % GEL Apply topically as needed. Reported on 02/19/2016    . DULoxetine (CYMBALTA) 30 MG capsule Take 3 capsules (90 mg total) by mouth daily. (stop the 60 mg strength) (Patient taking differently: Take 90 mg by mouth daily. (stop the 60 mg strength)) 270 capsule 1  . EPINEPHRINE 0.3 mg/0.3 mL IJ SOAJ injection Inject 0.3 mLs (0.3 mg total) into the muscle once. 2 Device 1  . furosemide (LASIX) 20 MG tablet TAKE ONE TABLET BY MOUTH IN THE MORNING AND  1  EVERY  AFTERNOON 60 tablet 1  . glucose  blood (ACCU-CHEK SMARTVIEW) test strip Use as instructed to check blood glucose once a day and prn 50 each 1  . Insulin Pen Needle 32G X 6 MM MISC 1 Device by Does not apply route daily. 50 each 0  . levothyroxine (SYNTHROID, LEVOTHROID) 125 MCG tablet Take 1 tablet (125 mcg total) by mouth daily. 90 tablet 1  . magnesium oxide (MAG-OX) 400 MG tablet Take 1 tablet (400 mg total) by mouth 2 (two) times daily. 60 tablet 2  . pantoprazole (PROTONIX) 40 MG tablet Take 1 tablet (40 mg total) by mouth daily. Caution:prolonged use may increase risk of pneumonia, colitis, osteoporosis, anemia 30 tablet 5  . potassium chloride (K-DUR,KLOR-CON) 10 MEQ tablet Two tablets by mouth in the morning, one tablet in the afternoon 90 tablet 1  .  tiZANidine (ZANAFLEX) 4 MG capsule Take 4 mg by mouth 3 (three) times daily as needed.    . valsartan (DIOVAN) 160 MG tablet Take 1 tablet (160 mg total) by mouth daily. 90 tablet 3  . acebutolol (SECTRAL) 200 MG capsule Take 1 capsule (200 mg total) by mouth 2 (two) times daily. 60 capsule 6  . gabapentin (NEURONTIN) 600 MG tablet Take 600 mg by mouth 4 (four) times daily.   0   No current facility-administered medications for this visit.     Allergies: Meperidine; Shellfish allergy; and Mirtazapine    PHYSICAL EXAM: VS:  BP 120/80 (BP Location: Left Arm, Patient Position: Sitting, Cuff Size: Normal)   Pulse 67   Ht 5\' 5"  (1.651 m)   Wt 197 lb 5 oz (89.5 kg)   BMI 32.83 kg/m  , Body mass index is 32.83 kg/m. Wt Readings from Last 3 Encounters:  06/23/16 197 lb 5 oz (89.5 kg)  05/25/16 195 lb (88.5 kg)  05/06/16 194 lb (88 kg)    GENERAL:  well developed, well nourished, obese, not in acute distress HEENT: normocephalic, pink conjunctivae, anicteric sclerae, no xanthelasma, normal dentition, oropharynx clear NECK:  no neck vein engorgement, JVP normal, no hepatojugular reflux, carotid upstroke brisk and symmetric, no bruit, no thyromegaly, no lymphadenopathy LUNGS:  good respiratory effort, clear to auscultation bilaterally CV:  PMI not displaced, no thrills, no lifts, S1 and S2 within normal limits, no palpable S3 or S4, no murmurs, no rubs, no gallops ABD:  Soft, nontender, nondistended, normoactive bowel sounds, no abdominal aortic bruit, no hepatomegaly, no splenomegaly MS: nontender back, no kyphosis, no scoliosis, no joint deformities EXT:  2+ DP/PT pulses, Nonpitting edema over the dorsum of right foot, no varicosities, no cyanosis, no clubbing SKIN: warm, nondiaphoretic, normal turgor, no ulcers NEUROPSYCH: alert, oriented to person, place, and time, sensory/motor grossly intact, normal mood, appropriate affect  Recent Labs: 12/16/2015: TSH 3.060 02/19/2016: BNP  4.8 03/24/2016: Magnesium 1.9 05/06/2016: ALT 18; BUN 17; Creatinine, Ser 0.86; Hemoglobin 15.1; Platelets 350; Potassium 4.4; Sodium 135   Lipid Panel    Component Value Date/Time   CHOL 238 (H) 11/24/2015 0826   TRIG 319 (H) 11/24/2015 0826   HDL 50 11/24/2015 0826   CHOLHDL 4.8 (H) 11/24/2015 0826   LDLCALC 124 (H) 11/24/2015 [id number]     Other studies Reviewed:  EKG:  The ekg from 02/19/2016 was personally reviewed by me and it revealed sinus rhythm, 71 BPM, low voltage QRS.  EKG from 06/23/2016 was personally reviewed by me. This showed sinus rhythm, 67 BPM.  Additional studies/ records that were reviewed personally reviewed by me today include: Echo 03/09/2016: Left ventricle: The cavity  size was normal. Systolic function was  normal. The estimated ejection fraction was in the range of 60%  to 65%. Wall motion was normal; there were no regional wall  motion abnormalities. Left ventricular diastolic function  parameters were normal. - Mitral valve: There was mild regurgitation. - Left atrium: The atrium was normal in size. - Right ventricle: Systolic function was normal. - Pulmonary arteries: Systolic pressure was within the normal  range. - Inferior vena cava: The vessel was normal in size. The  respirophasic diameter changes were in the normal range (>= 50%),  consistent with normal central venous pressure.  Holter 03/09/2016: Overall rhythm was sinus, average of 83 BPM. Minimum heart rate of 64 BPM. Maximum heart rate of 123 BPM.  Supraventricular ectopy comprised of 4 isolated PACs.  Ventricular ectopy comprised of 9% of the total number of beats:10,356 isolated PVCs. One ventricular couplet. 3842 in ventricular bigeminy. 1 nonsustained ventricular run comprised of 4 beats at 146 BPM, 4 a.m. 03/10/2016.  Ex nuc stress test 02/26/2016:  Blood pressure demonstrated a hypertensive response to exercise. Frequent PVCs with exercise.  There was no ST segment deviation  noted during stress.  No T wave inversion was noted during stress.  The study is normal.  This is a low risk study. The left ventricular ejection fraction is normal (55-65%).    ASSESSMENT AND PLAN:  Palpitations Discussed findings of Holter monitor: Ventricular ectopy comprised 9% of the total number of beats.10,356 isolated PVCs. One ventricular couplet. 3842 in ventricular bigeminy. 1 nonsustained ventricular run comprised of 4 beats at 146 BPM, 4 a.m. 03/10/2016. No ischemia on stress testing. Since the patient did not see any improvement with metoprolol, we'll try Sectral 200 mg twice a day. Patient reports that she has been noted to snore. We talked about possibility of sleep apnea in relation to her PVCs. She will discuss this with her PCP. Recommend sleep study.  Shortness of breath with exertion Risk factors for coronary artery disease include hypertension, postmenopausal state, hyperlipidemia. Exercise nuclear stress test did not reveal ischemia. Normal study. This portends a low likelihood of clinically significant CAD. Recommend risk factor modification.  Swelling Nonpitting edema over right foot No other evidence of congestive heart failure on physical exam Preserved ejection fraction and echocardiogram. BNP was within normal limits. All these findings point to low likelihood of CHF. Recommend sodium restriction.  Obesity Body mass index is 32.83 kg/m.Marland Kitchen Recommend aggressive weight loss through diet and increased physical activity.  Current medicines are reviewed at length with the patient today.  The patient does not have concerns regarding medicines.  Labs/ tests ordered today include:  No orders of the defined types were placed in this encounter.   I had a lengthy and detailed discussion with the patient regarding diagnoses, prognosis, diagnostic options, treatment options .   I counseled the patient on importance of lifestyle modification including heart  healthy diet, regular physical activity .   Disposition:   FU with undersigned in 61month  Signed, Wende Bushy, MD  06/23/2016 12:59 PM    [place]

## 2016-06-23 ENCOUNTER — Ambulatory Visit (INDEPENDENT_AMBULATORY_CARE_PROVIDER_SITE_OTHER): Payer: BLUE CROSS/BLUE SHIELD | Admitting: Cardiology

## 2016-06-23 ENCOUNTER — Other Ambulatory Visit: Payer: Self-pay | Admitting: Family Medicine

## 2016-06-23 ENCOUNTER — Encounter: Payer: Self-pay | Admitting: Cardiology

## 2016-06-23 VITALS — BP 120/80 | HR 67 | Ht 65.0 in | Wt 197.3 lb

## 2016-06-23 DIAGNOSIS — I493 Ventricular premature depolarization: Secondary | ICD-10-CM | POA: Diagnosis not present

## 2016-06-23 DIAGNOSIS — E669 Obesity, unspecified: Secondary | ICD-10-CM

## 2016-06-23 DIAGNOSIS — R0683 Snoring: Secondary | ICD-10-CM

## 2016-06-23 MED ORDER — ACEBUTOLOL HCL 200 MG PO CAPS: 200.0000 mg | ORAL_CAPSULE | Freq: Two times a day (BID) | ORAL | 6 refills | Status: DC

## 2016-06-23 NOTE — Patient Instructions (Signed)
Medication Instructions:  Your physician has recommended you make the following change in your medication:  1. STOP taking metoprolol 2. START taking Sectral 200 mg Twice Daily  Your physician has requested that you regularly monitor and record your blood pressure readings at home. Please use the same machine at the same time of day to check your readings and record them to bring to your follow-up visit.   Follow-Up: Your physician recommends that you schedule a follow-up appointment in: 1 month with Dr. Yvone Neu.  It was a pleasure seeing you today here in the office. Please do not hesitate to give [place] a call back if you have any further questions. [place], BSN

## 2016-06-23 NOTE — Progress Notes (Signed)
Referral to pulm for sleep study put in after reading Dr. Tora Kindred note

## 2016-06-24 ENCOUNTER — Encounter: Payer: Self-pay | Admitting: Cardiology

## 2016-06-25 NOTE — Addendum Note (Signed)
Addended by: Dede Query R on: 06/25/2016 02:50 PM   Modules accepted: Orders

## 2016-06-28 ENCOUNTER — Encounter: Payer: Self-pay | Admitting: Internal Medicine

## 2016-06-28 ENCOUNTER — Ambulatory Visit (INDEPENDENT_AMBULATORY_CARE_PROVIDER_SITE_OTHER): Payer: BLUE CROSS/BLUE SHIELD | Admitting: Internal Medicine

## 2016-06-28 VITALS — BP 132/84 | HR 54 | Ht 65.0 in | Wt 199.6 lb

## 2016-06-28 DIAGNOSIS — G4719 Other hypersomnia: Secondary | ICD-10-CM

## 2016-06-28 NOTE — Patient Instructions (Signed)
Obtain Sleep Study  Sleep Apnea  Sleep apnea is a sleep disorder characterized by abnormal pauses in breathing while you sleep. When your breathing pauses, the level of oxygen in your blood decreases. This causes you to move out of deep sleep and into light sleep. As a result, your quality of sleep is poor, and the system that carries your blood throughout your body (cardiovascular system) experiences stress. If sleep apnea remains untreated, the following conditions can develop:  High blood pressure (hypertension).  Coronary artery disease.  Inability to achieve or maintain an erection (impotence).  Impairment of your thought process (cognitive dysfunction). There are three types of sleep apnea: 1. Obstructive sleep apnea--Pauses in breathing during sleep because of a blocked airway. 2. Central sleep apnea--Pauses in breathing during sleep because the area of the brain that controls your breathing does not send the correct signals to the muscles that control breathing. 3. Mixed sleep apnea--A combination of both obstructive and central sleep apnea. RISK FACTORS The following risk factors can increase your risk of developing sleep apnea:  Being overweight.  Smoking.  Having narrow passages in your nose and throat.  Being of older age.  Being female.  Alcohol use.  Sedative and tranquilizer use.  Ethnicity. Among individuals younger than 35 years, African Americans are at increased risk of sleep apnea. SYMPTOMS   Difficulty staying asleep.  Daytime sleepiness and fatigue.  Loss of energy.  Irritability.  Loud, heavy snoring.  Morning headaches.  Trouble concentrating.  Forgetfulness.  Decreased interest in sex.  Unexplained sleepiness. DIAGNOSIS  In order to diagnose sleep apnea, your caregiver will perform a physical examination. A sleep study done in the comfort of your own home may be appropriate if you are otherwise healthy. Your caregiver may also recommend  that you spend the night in a sleep lab. In the sleep lab, several monitors record information about your heart, lungs, and brain while you sleep. Your leg and arm movements and blood oxygen level are also recorded. TREATMENT The following actions may help to resolve mild sleep apnea:  Sleeping on your side.   Using a decongestant if you have nasal congestion.   Avoiding the use of depressants, including alcohol, sedatives, and narcotics.   Losing weight and modifying your diet if you are overweight. There also are devices and treatments to help open your airway:  Oral appliances. These are custom-made mouthpieces that shift your lower jaw forward and slightly open your bite. This opens your airway.  Devices that create positive airway pressure. This positive pressure "splints" your airway open to help you breathe better during sleep. The following devices create positive airway pressure:  Continuous positive airway pressure (CPAP) device. The CPAP device creates a continuous level of air pressure with an air pump. The air is delivered to your airway through a mask while you sleep. This continuous pressure keeps your airway open.  Nasal expiratory positive airway pressure (EPAP) device. The EPAP device creates positive air pressure as you exhale. The device consists of single-use valves, which are inserted into each nostril and held in place by adhesive. The valves create very little resistance when you inhale but create much more resistance when you exhale. That increased resistance creates the positive airway pressure. This positive pressure while you exhale keeps your airway open, making it easier to breath when you inhale again.  Bilevel positive airway pressure (BPAP) device. The BPAP device is used mainly in patients with central sleep apnea. This device is similar to  the CPAP device because it also uses an air pump to deliver continuous air pressure through a mask. However, with the  BPAP machine, the pressure is set at two different levels. The pressure when you exhale is lower than the pressure when you inhale.  Surgery. Typically, surgery is only done if you cannot comply with less invasive treatments or if the less invasive treatments do not improve your condition. Surgery involves removing excess tissue in your airway to create a wider passage way.   This information is not intended to replace advice given to you by your health care provider. Make sure you discuss any questions you have with your health care provider.   Document Released: 10/08/2002 Document Revised: 11/08/2014 Document Reviewed: 02/24/2012 Elsevier Interactive Patient Education [employer].

## 2016-06-28 NOTE — Progress Notes (Signed)
[facility] Consultation      Date: 06/28/2016,   MRN# [id number] Laura [name] Laura [name] [birth year] Code Status:  Code Status History    This patient does not have a recorded code status. Please follow your organizational policy for patients in this situation.     Hosp day:@LENGTHOFSTAYDAYS @ Referring MD: @ATDPROV @     PCP:      AdmissionWeight: 199 lb 9.6 oz (90.5 kg)                 CurrentWeight: 199 lb 9.6 oz (90.5 kg) Laura [name] Seen is a 50 y.o. old female seen in consultation for sl;eep problems at the request of Dr. Sanda Klein.     CHIEF COMPLAINT:   Problems sleeping   HISTORY OF PRESENT ILLNESS   50 yo pleasant white female sen today for problems with sleep She has restless sleep, Non-resfreshing sleep when she wakes up She has daily fatigue and daytime sleepiness This has been going on for a long time-for many years Has dx of fibromyalgia which makes her sleep issues worse Has dx of ASTHMA uses albuterol as needed Nonsmoker quit 25 years ago, used to smoke 1 ppd for 8 years Last ER vsisit for ASTHMA was 8 years ago She is steroid reponsive but has lots of side affects Has GERD on protonix Has HTN on 2 meds Has gained 20 pounds in last year  No signs of infection at this time No acute resp issues at this time No wheezing, no cough, no SIB Has chronic LE swelling and edema    PAST MEDICAL HISTORY   Past Medical History:  Diagnosis Date  . Anxiety and depression   . Bacterial vaginitis   . Barrett's esophagus 04/07/2016  . Cystocele   . Elevated fasting blood sugar   . Exposure to hepatitis C   . GERD (gastroesophageal reflux disease)   . High cholesterol   . Hypertension   . Hypokalemia   . Increased BMI   . Kidney disease   . Rectocele   . Urinary retention with incomplete bladder emptying   . Vaginal dryness, menopausal   . Vaginal enterocele   . Vitamin D deficiency 12/03/2014  . Yeast infection      SURGICAL HISTORY   Past  Surgical History:  Procedure Laterality Date  . ABDOMINAL HYSTERECTOMY    . APPENDECTOMY    . PARTIAL HYSTERECTOMY    . thumb surgery       FAMILY HISTORY   Family History  Problem Relation Age of Onset  . Stroke Father   . Breast cancer Mother 54     SOCIAL HISTORY   Social History  Substance Use Topics  . Smoking status: Former Research scientist (life sciences)  . Smokeless tobacco: Never Used  . Alcohol use 0.0 oz/week     MEDICATIONS    Home Medication:  Current Outpatient Rx  . Order #: [id number] Class: Normal  . Order #: [id number] Class: Normal  . Order #: [id number] Class: Historical Med  . Order #: [id number] Class: Historical Med  . Order #: [id number] Class: Normal  . Order #: [id number] Class: Historical Med  . Order #: [id number] Class: Normal  . Order #: [id number] Class: Historical Med  . Order #: [id number] Class: Normal  . Order #: [id number] Class: Normal  . Order #: [id number] Class: Normal  . Order #: [id number] Class: Historical Med  . Order #: [id number] Class: Normal  . Order #: [id number] Class: Normal  . Order #: [id number] Class: Normal  . Order #: [id number] Class: Normal  . Order #: [id number] Class: Normal  .  Order #: [id number] Class: Normal  . Order #: [id number] Class: Historical Med  . Order #: [id number] Class: Normal    Current Medication:  Current Outpatient Prescriptions:  .  acebutolol (SECTRAL) 200 MG capsule, Take 1 capsule (200 mg total) by mouth 2 (two) times daily., Disp: 60 capsule, Rfl: 6 .  albuterol (PROVENTIL HFA;VENTOLIN HFA) 108 (90 Base) MCG/ACT inhaler, Inhale 2 puffs into the lungs every 4 (four) hours as needed for wheezing or shortness of breath., Disp: 1 Inhaler, Rfl: 1 .  ALPRAZolam (XANAX) 1 MG tablet, Take 1 mg by mouth 4 (four) times daily., Disp: , Rfl:  .  amphetamine-dextroamphetamine (ADDERALL) 20 MG tablet, , Disp: , Rfl: 0 .  atorvastatin (LIPITOR) 40 MG tablet, Take 0.5 tablets (20 mg total) by mouth daily., Disp: 45 tablet, Rfl: 0 .  buPROPion (WELLBUTRIN  XL) 150 MG 24 hr tablet, , Disp: , Rfl: 0 .  clobetasol cream (TEMOVATE) [id number] %, Apply 1 application topically 2 (two) times daily., Disp: 30 g, Rfl: 0 .  diclofenac sodium (VOLTAREN) 1 % GEL, Apply topically as needed. Reported on 02/19/2016, Disp: , Rfl:  .  DULoxetine (CYMBALTA) 30 MG capsule, Take 3 capsules (90 mg total) by mouth daily. (stop the 60 mg strength) (Patient taking differently: Take 90 mg by mouth daily. (stop the 60 mg strength)), Disp: 270 capsule, Rfl: 1 .  EPINEPHRINE 0.3 mg/0.3 mL IJ SOAJ injection, Inject 0.3 mLs (0.3 mg total) into the muscle once., Disp: 2 Device, Rfl: 1 .  furosemide (LASIX) 20 MG tablet, TAKE ONE TABLET BY MOUTH IN THE MORNING AND  1  EVERY  AFTERNOON, Disp: 60 tablet, Rfl: 1 .  gabapentin (NEURONTIN) 600 MG tablet, Take 600 mg by mouth 4 (four) times daily. , Disp: , Rfl: 0 .  glucose blood (ACCU-CHEK SMARTVIEW) test strip, Use as instructed to check blood glucose once a day and prn, Disp: 50 each, Rfl: 1 .  Insulin Pen Needle 32G X 6 MM MISC, 1 Device by Does not apply route daily., Disp: 50 each, Rfl: 0 .  levothyroxine (SYNTHROID, LEVOTHROID) 125 MCG tablet, Take 1 tablet (125 mcg total) by mouth daily., Disp: 90 tablet, Rfl: 1 .  magnesium oxide (MAG-OX) 400 MG tablet, Take 1 tablet (400 mg total) by mouth 2 (two) times daily., Disp: 60 tablet, Rfl: 2 .  pantoprazole (PROTONIX) 40 MG tablet, Take 1 tablet (40 mg total) by mouth daily. Caution:prolonged use may increase risk of pneumonia, colitis, osteoporosis, anemia, Disp: 30 tablet, Rfl: 5 .  potassium chloride (K-DUR,KLOR-CON) 10 MEQ tablet, Two tablets by mouth in the morning, one tablet in the afternoon, Disp: 90 tablet, Rfl: 1 .  tiZANidine (ZANAFLEX) 4 MG capsule, Take 4 mg by mouth 3 (three) times daily as needed., Disp: , Rfl:  .  valsartan (DIOVAN) 160 MG tablet, Take 1 tablet (160 mg total) by mouth daily., Disp: 90 tablet, Rfl: 3    ALLERGIES   Meperidine; Shellfish allergy; and  Mirtazapine     REVIEW OF SYSTEMS   Review of Systems  Constitutional: Positive for malaise/fatigue. Negative for chills, diaphoresis, fever and weight loss.  HENT: Negative for congestion and hearing loss.   Eyes: Negative for blurred vision and double vision.  Respiratory: Negative for cough, hemoptysis, sputum production, shortness of breath and wheezing.   Cardiovascular: Negative for chest pain, palpitations and orthopnea.  Gastrointestinal: Positive for heartburn. Negative for abdominal pain, nausea and vomiting.  Genitourinary: Negative for dysuria and urgency.  Musculoskeletal: Negative for  back pain, myalgias and neck pain.  Skin: Negative for rash.  Neurological: Negative for dizziness, tingling, tremors, weakness and headaches.  Endo/Heme/Allergies: Does not bruise/bleed easily.  Psychiatric/Behavioral: Negative for depression, substance abuse and suicidal ideas. The patient is nervous/anxious.   All other systems reviewed and are negative.    VS: BP 132/84 (BP Location: Left Arm, Cuff Size: Normal)   Pulse (!) 54   Ht 5\' 5"  (1.651 m)   Wt 199 lb 9.6 oz (90.5 kg)   SpO2 97%   BMI 33.22 kg/m      PHYSICAL EXAM  Physical Exam  Constitutional: She is oriented to person, place, and time. She appears well-developed and well-nourished. No distress.  HENT:  Head: Normocephalic and atraumatic.  Mouth/Throat: No oropharyngeal exudate.  Eyes: EOM are normal. Pupils are equal, round, and reactive to light. No scleral icterus.  Neck: Normal range of motion. Neck supple.  Cardiovascular: Normal rate, regular rhythm and normal heart sounds.   No murmur heard. Pulmonary/Chest: No stridor. No respiratory distress. She has no wheezes. She has no rales.  Abdominal: Soft. Bowel sounds are normal.  Musculoskeletal: Normal range of motion. She exhibits edema.  Neurological: She is alert and oriented to person, place, and time. No cranial nerve deficit.  Skin: Skin is warm. She  is not diaphoretic.  Psychiatric: She has a normal mood and affect.          IMAGING    CXR 05/2016 images reviewed 06/28/2016 No acute opacities, no masess no efffusions   ASSESSMENT/PLAN   50 yo white female with h/o Fibromyalgia with problems with sleep with probable underlying OSA with increased weight gain and HTN with Mild intermittent well controlled ASTHMA with Well controlled GERD    1.need to obtain sleep study to assess for OSA 2.continue albuterol as needed 3.continue PPI and follow up with GI this week 4.diet and exercise and weight loss recommended  Follow up after test completed    I have personally obtained a history, examined the patient, evaluated laboratory and independently reviewed imaging results, formulated the assessment and plan and placed orders.  The Patient requires high complexity decision making for assessment and support, frequent evaluation and titration of therapies, application of advanced monitoring technologies and extensive interpretation of multiple databases.   Patient satisfied with Plan of action and management. All questions answered  Corrin Parker, M.D.  [facility]  Medical Director [place] Director [facility] Cardio-Pulmonary Department

## 2016-07-01 ENCOUNTER — Telehealth: Payer: Self-pay

## 2016-07-01 ENCOUNTER — Ambulatory Visit: Payer: BLUE CROSS/BLUE SHIELD

## 2016-07-01 VITALS — Ht 65.0 in | Wt 197.4 lb

## 2016-07-01 DIAGNOSIS — Z1211 Encounter for screening for malignant neoplasm of colon: Secondary | ICD-10-CM

## 2016-07-01 NOTE — Telephone Encounter (Signed)
OK to add EGD - Barrett's esophagus dx

## 2016-07-01 NOTE — Telephone Encounter (Signed)
Dr Carlean Purl H/O duodenal ulcer and Barrett's (mgf deceased from esophageal CA 97's) 10-15 yrs ago by we think Ivory Broad, [facility] in [place].  May we add an EGD on?                                 Thank you, Jonie Burdell/PV

## 2016-07-06 DIAGNOSIS — F419 Anxiety disorder, unspecified: Secondary | ICD-10-CM | POA: Insufficient documentation

## 2016-07-06 DIAGNOSIS — F329 Major depressive disorder, single episode, unspecified: Secondary | ICD-10-CM | POA: Insufficient documentation

## 2016-07-06 DIAGNOSIS — I10 Essential (primary) hypertension: Secondary | ICD-10-CM | POA: Insufficient documentation

## 2016-07-06 DIAGNOSIS — M159 Polyosteoarthritis, unspecified: Secondary | ICD-10-CM | POA: Insufficient documentation

## 2016-07-06 DIAGNOSIS — F32A Depression, unspecified: Secondary | ICD-10-CM | POA: Insufficient documentation

## 2016-07-06 DIAGNOSIS — I7300 Raynaud's syndrome without gangrene: Secondary | ICD-10-CM | POA: Insufficient documentation

## 2016-07-06 DIAGNOSIS — F411 Generalized anxiety disorder: Secondary | ICD-10-CM | POA: Insufficient documentation

## 2016-07-13 ENCOUNTER — Encounter: Payer: Self-pay | Admitting: Internal Medicine

## 2016-07-13 ENCOUNTER — Ambulatory Visit (AMBULATORY_SURGERY_CENTER): Payer: BLUE CROSS/BLUE SHIELD | Admitting: Internal Medicine

## 2016-07-13 VITALS — BP 149/86 | HR 66 | Temp 98.6°F | Resp 21 | Ht 65.0 in | Wt 197.0 lb

## 2016-07-13 DIAGNOSIS — K2270 Barrett's esophagus without dysplasia: Secondary | ICD-10-CM

## 2016-07-13 DIAGNOSIS — Z1211 Encounter for screening for malignant neoplasm of colon: Secondary | ICD-10-CM

## 2016-07-13 DIAGNOSIS — D128 Benign neoplasm of rectum: Secondary | ICD-10-CM

## 2016-07-13 DIAGNOSIS — D123 Benign neoplasm of transverse colon: Secondary | ICD-10-CM | POA: Diagnosis not present

## 2016-07-13 DIAGNOSIS — K208 Other esophagitis: Secondary | ICD-10-CM | POA: Diagnosis not present

## 2016-07-13 MED ORDER — SODIUM CHLORIDE 0.9 % IV SOLN: 500.0000 mL | INTRAVENOUS | Status: DC

## 2016-07-13 NOTE — Patient Instructions (Addendum)
You might have Barrett's esophagus - I think mostly changes from a hiatal hernia. I took biopsies and will let you know.  I found and removed 2 polyps from the colon - tiny and benign in appearance.  I will let you know pathology results and when to have another routine colonoscopy by mail/phone.  I appreciate the opportunity to care for you. Gatha Mayer, MD, FACG     YOU HAD AN ENDOSCOPIC PROCEDURE TODAY AT [facility]:   Refer to the procedure report that was given to you for any specific questions about what was found during the examination.  If the procedure report does not answer your questions, please call your gastroenterologist to clarify.  If you requested that your care partner not be given the details of your procedure findings, then the procedure report has been included in a sealed envelope for you to review at your convenience later.  YOU SHOULD EXPECT: Some feelings of bloating in the abdomen. Passage of more gas than usual.  Walking can help get rid of the air that was put into your GI tract during the procedure and reduce the bloating. If you had a lower endoscopy (such as a colonoscopy or flexible sigmoidoscopy) you may notice spotting of blood in your stool or on the toilet paper. If you underwent a bowel prep for your procedure, you may not have a normal bowel movement for a few days.  Please Note:  You might notice some irritation and congestion in your nose or some drainage.  This is from the oxygen used during your procedure.  There is no need for concern and it should clear up in a day or so.  SYMPTOMS TO REPORT IMMEDIATELY:   Following lower endoscopy (colonoscopy or flexible sigmoidoscopy):  Excessive amounts of blood in the stool  Significant tenderness or worsening of abdominal pains  Swelling of the abdomen that is new, acute  Fever of 100F or higher   Following upper endoscopy (EGD)  Vomiting of blood or coffee ground  material  New chest pain or pain under the shoulder blades  Painful or persistently difficult swallowing  New shortness of breath  Fever of 100F or higher  Black, tarry-looking stools  For urgent or emergent issues, a gastroenterologist can be reached at any hour by calling [phone].   DIET:  We do recommend a small meal at first, but then you may proceed to your regular diet.  Drink plenty of fluids but you should avoid alcoholic beverages for 24 hours.  ACTIVITY:  You should plan to take it easy for the rest of today and you should NOT DRIVE or use heavy machinery until tomorrow (because of the sedation medicines used during the test).    FOLLOW UP: Our staff will call the number listed on your records the next business day following your procedure to check on you and address any questions or concerns that you may have regarding the information given to you following your procedure. If we do not reach you, we will leave a message.  However, if you are feeling well and you are not experiencing any problems, there is no need to return our call.  We will assume that you have returned to your regular daily activities without incident.  If any biopsies were taken you will be contacted by phone or by letter within the next 1-3 weeks.  Please call us at [phone] if you have not heard about the biopsies in  3 weeks.    SIGNATURES/CONFIDENTIALITY: You and/or your care partner have signed paperwork which will be entered into your electronic medical record.  These signatures attest to the fact that that the information above on your After Visit Summary has been reviewed and is understood.  Full responsibility of the confidentiality of this discharge information lies with you and/or your care-partner.   Handouts were given to your care partner on polyps and a hiatal hernia. You may resume your current medications today. Await biopsy results. Please call if any questions or  concerns.

## 2016-07-13 NOTE — Progress Notes (Signed)
Called to room to assist during endoscopic procedure.  Patient ID and intended procedure confirmed with present staff. Received instructions for my participation in the procedure from the performing physician.  

## 2016-07-13 NOTE — Progress Notes (Signed)
No problems noted in the recovery room. maw 

## 2016-07-13 NOTE — Op Note (Signed)
[facility] Patient Name: Laura [name] Procedure Date: 07/13/2016 2:52 PM MRN: [id number] Endoscopist: Gatha Mayer , MD Age: 50 Referring MD:  Date of Birth: 1966-04-14 Gender: Female Account #: [id number] Procedure:                Colonoscopy Indications:              Screening for colorectal malignant neoplasm, This                            is the patient's first colonoscopy Medicines:                Propofol per Anesthesia, Monitored Anesthesia Care Procedure:                Pre-Anesthesia Assessment:                           - Prior to the procedure, a History and Physical                            was performed, and patient medications and                            allergies were reviewed. The patient's tolerance of                            previous anesthesia was also reviewed. The risks                            and benefits of the procedure and the sedation                            options and risks were discussed with the patient.                            All questions were answered, and informed consent                            was obtained. Prior Anticoagulants: The patient has                            taken no previous anticoagulant or antiplatelet                            agents. ASA Grade Assessment: II - A patient with                            mild systemic disease. After reviewing the risks                            and benefits, the patient was deemed in                            satisfactory condition to undergo the procedure.  After obtaining informed consent, the colonoscope                            was passed under direct vision. Throughout the                            procedure, the patient's blood pressure, pulse, and                            oxygen saturations were monitored continuously. The                            Model CF-HQ190L [id number]) scope was introduced   through the anus and advanced to the the cecum,                            identified by appendiceal orifice and ileocecal                            valve. The quality of the bowel preparation was                            adequate. The colonoscopy was performed without                            difficulty. The patient tolerated the procedure                            well. The bowel preparation used was Miralax. The                            ileocecal valve, appendiceal orifice, and rectum                            were photographed. Scope In: 3:12:02 PM Scope Out: 3:30:58 PM Scope Withdrawal Time: 0 hours 15 minutes 45 seconds  Total Procedure Duration: 0 hours 18 minutes 56 seconds  Findings:                 The perianal and digital rectal examinations were                            normal.                           No additional abnormalities were found on                            retroflexion.                           Two sessile polyps were found in the rectum and                            transverse colon. The polyps were 3 to 5 mm in  size. These polyps were removed with a cold snare.                            Resection and retrieval were complete. Verification                            of patient identification for the specimen was                            done. Estimated blood loss was minimal.                           The exam was otherwise without abnormality on                            direct and retroflexion views. Complications:            No immediate complications. Estimated blood loss:                            None. Estimated Blood Loss:     Estimated blood loss: none. Impression:               - Two 3 to 5 mm polyps in the rectum and in the                            transverse colon, removed with a cold snare.                            Resected and retrieved.                           - The examination was otherwise  normal on direct                            and retroflexion views. Recommendation:           - Patient has a contact number available for                            emergencies. The signs and symptoms of potential                            delayed complications were discussed with the                            patient. Return to normal activities tomorrow.                            Written discharge instructions were provided to the                            patient.                           - Resume previous diet.                           -  Continue present medications.                           - Patient has a contact number available for                            emergencies. The signs and symptoms of potential                            delayed complications were discussed with the                            patient. Return to normal activities tomorrow.                            Written discharge instructions were provided to the                            patient.                           - Repeat colonoscopy is recommended. The                            colonoscopy date will be determined after pathology                            results from today's exam become available for                            review. Gatha Mayer, MD 07/13/2016 3:45:36 PM This report has been signed electronically.

## 2016-07-13 NOTE — Op Note (Signed)
[facility] Patient Name: Laura [name] Procedure Date: 07/13/2016 2:53 PM MRN: [id number] Endoscopist: Gatha Mayer , MD Age: 50 Referring MD:  Date of Birth: [birth year] Gender: Female Account #: [id number] Procedure:                Upper GI endoscopy Indications:              Follow-up of Barrett's esophagus Medicines:                Propofol per Anesthesia, Monitored Anesthesia Care Procedure:                Pre-Anesthesia Assessment:                           - Prior to the procedure, a History and Physical                            was performed, and patient medications and                            allergies were reviewed. The patient's tolerance of                            previous anesthesia was also reviewed. The risks                            and benefits of the procedure and the sedation                            options and risks were discussed with the patient.                            All questions were answered, and informed consent                            was obtained. Prior Anticoagulants: The patient has                            taken no previous anticoagulant or antiplatelet                            agents. ASA Grade Assessment: II - A patient with                            mild systemic disease. After reviewing the risks                            and benefits, the patient was deemed in                            satisfactory condition to undergo the procedure.                           After obtaining informed consent, the endoscope was  passed under direct vision. Throughout the                            procedure, the patient's blood pressure, pulse, and                            oxygen saturations were monitored continuously. The                            Model GIF-HQ190 [id number]) scope was introduced                            through the mouth, and advanced to the second part                             of duodenum. The upper GI endoscopy was                            accomplished without difficulty. The patient                            tolerated the procedure well. Scope In: Scope Out: Findings:                 The esophagus and gastroesophageal junction were                            examined with white light and narrow band imaging                            (NBI) from a forward view and retroflexed position.                            Barrett's esophagus was present. This involved the                            mucosa along an irregular Z-line (30 cm from the                            incisors). One tongue of salmon-colored mucosa was                            present from 29 to 30 cm. The maximum longitudinal                            extent of these esophageal mucosal changes was 0.5                            cm in length. Biopsies were taken with a cold                            forceps for histology. Verification of patient  identification for the specimen was done. Estimated                            blood loss was minimal.                           A 2 cm hiatal hernia was present.                           The exam was otherwise without abnormality.                           The cardia and gastric fundus were normal on                            retroflexion. Complications:            No immediate complications. Estimated Blood Loss:     Estimated blood loss was minimal. Impression:               - Possible Barrett's esophagus vs hiatal hernia                            effect/irregular Z-line. Biopsied to look for                            intestinal metaplasia in the one tongue.                           - 2 cm hiatal hernia.                           - The examination was otherwise normal. Recommendation:           - Patient has a contact number available for                            emergencies. The signs and symptoms of potential                             delayed complications were discussed with the                            patient. Return to normal activities tomorrow.                            Written discharge instructions were provided to the                            patient.                           - Resume previous diet.                           - Continue present medications.                           -  Await pathology results.                           - See the other procedure note for documentation of                            additional recommendations.                           - These changes do not seem to meet current                            Barrett's criteria - i.e. tongue < 1 cm. she has a                            hx of the dx and is concerned and grandfather died                            of esophageal cancer so I took biopises to look for                            intestinal metaplasia. Gatha Mayer, MD 07/13/2016 3:42:02 PM This report has been signed electronically.

## 2016-07-13 NOTE — Progress Notes (Signed)
Patient awakening,vss,report to rn 

## 2016-07-14 ENCOUNTER — Telehealth: Payer: Self-pay | Admitting: *Deleted

## 2016-07-14 NOTE — Telephone Encounter (Signed)
Name identifier, left message, follow-up 

## 2016-07-15 ENCOUNTER — Encounter: Payer: BLUE CROSS/BLUE SHIELD | Admitting: Internal Medicine
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
## 2016-07-20 ENCOUNTER — Ambulatory Visit (INDEPENDENT_AMBULATORY_CARE_PROVIDER_SITE_OTHER): Payer: BLUE CROSS/BLUE SHIELD | Admitting: Cardiology

## 2016-07-20 ENCOUNTER — Encounter: Payer: Self-pay | Admitting: Cardiology

## 2016-07-20 VITALS — BP 150/78 | HR 89 | Ht 65.0 in | Wt 195.8 lb

## 2016-07-20 DIAGNOSIS — R002 Palpitations: Secondary | ICD-10-CM | POA: Diagnosis not present

## 2016-07-20 MED ORDER — PROPRANOLOL HCL 10 MG PO TABS: 10.0000 mg | ORAL_TABLET | Freq: Three times a day (TID) | ORAL | 6 refills | Status: DC

## 2016-07-20 NOTE — Patient Instructions (Signed)
Medication Instructions:  Your physician has recommended you make the following change in your medication:  1. START Propranolol 10 mg Three times a day  Follow-Up: Your physician recommends that you schedule a follow-up appointment in: 3 months with Dr. Yvone Neu.  It was a pleasure seeing you today here in the office. Please do not hesitate to give [place] a call back if you have any further questions. [place], BSN

## 2016-07-21 ENCOUNTER — Encounter: Payer: Self-pay | Admitting: Internal Medicine

## 2016-07-21 NOTE — Progress Notes (Signed)
No Barrett's hyperplastic rectal polyp and benign mucosal transverse polyp  No EGD recall Recall colon 2027  No letter - I sent My Chart note

## 2016-07-26 ENCOUNTER — Encounter: Payer: Self-pay | Admitting: Family Medicine

## 2016-07-26 ENCOUNTER — Ambulatory Visit (INDEPENDENT_AMBULATORY_CARE_PROVIDER_SITE_OTHER): Payer: BLUE CROSS/BLUE SHIELD | Admitting: Family Medicine

## 2016-07-26 ENCOUNTER — Other Ambulatory Visit: Payer: Self-pay | Admitting: Family Medicine

## 2016-07-26 VITALS — BP 108/72 | HR 98 | Temp 98.3°F | Resp 16 | Wt 191.0 lb

## 2016-07-26 DIAGNOSIS — E039 Hypothyroidism, unspecified: Secondary | ICD-10-CM | POA: Diagnosis not present

## 2016-07-26 DIAGNOSIS — E538 Deficiency of other specified B group vitamins: Secondary | ICD-10-CM

## 2016-07-26 DIAGNOSIS — Z23 Encounter for immunization: Secondary | ICD-10-CM

## 2016-07-26 DIAGNOSIS — E559 Vitamin D deficiency, unspecified: Secondary | ICD-10-CM | POA: Diagnosis not present

## 2016-07-26 DIAGNOSIS — M797 Fibromyalgia: Secondary | ICD-10-CM | POA: Diagnosis not present

## 2016-07-26 HISTORY — DX: Deficiency of other specified B group vitamins: E53.8

## 2016-07-26 NOTE — Assessment & Plan Note (Signed)
Consider integrative medicine

## 2016-07-26 NOTE — Patient Instructions (Addendum)
Try sublingual vitamin B12 under the tongue, 1000 or 2000 mcg daily Let me know what tests your daughter was positive for and we'll see about further testing Do build up activity gradually and slowly You received the flu shot today; it should protect you against the flu virus over the coming months; it will take about two weeks for antibodies to develop; do try to stay away from hospitals, nursing homes, and daycares during peak flu season; taking extra vitamin C daily during flu season may help you avoid getting sick Real green tea can be really helpful Consider meat-free and dairy-free and organic diet if possible Consider functional medicine consultation Such as [facility] Address: [address], [place], [place] [address]  Phone: [phone]

## 2016-07-26 NOTE — Assessment & Plan Note (Signed)
Check labs this week or next; currently on 2,000 iu vitamin D3 daily

## 2016-07-26 NOTE — Assessment & Plan Note (Signed)
Autoimmune disease in the family; check TPO antibodies, Free T3 and Free T4, as well as TSH
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
## 2016-07-26 NOTE — Assessment & Plan Note (Signed)
Start sublingual B12, 1000 or 2000 mcg daily

## 2016-07-28 ENCOUNTER — Ambulatory Visit: Payer: BLUE CROSS/BLUE SHIELD | Attending: Internal Medicine

## 2016-07-28 DIAGNOSIS — G4710 Hypersomnia, unspecified: Secondary | ICD-10-CM | POA: Insufficient documentation

## 2016-07-28 DIAGNOSIS — G4733 Obstructive sleep apnea (adult) (pediatric): Secondary | ICD-10-CM | POA: Diagnosis not present

## 2016-07-28 DIAGNOSIS — R0683 Snoring: Secondary | ICD-10-CM | POA: Diagnosis not present

## 2016-08-05 ENCOUNTER — Telehealth: Payer: Self-pay | Admitting: *Deleted

## 2016-08-05 DIAGNOSIS — G4733 Obstructive sleep apnea (adult) (pediatric): Secondary | ICD-10-CM

## 2016-08-05 NOTE — Telephone Encounter (Signed)
Pt informed of sleep study results. Order placed. Nothing further needed. 

## 2016-08-05 NOTE — Telephone Encounter (Signed)
-----   Message from Laverle Hobby, MD sent at 08/04/2016  2:47 PM EDT ----- Regarding: sleep study Sleep study positive for mild OSA. Will need titration (auto or in lab).

## 2016-08-12 ENCOUNTER — Ambulatory Visit: Payer: BLUE CROSS/BLUE SHIELD | Attending: Internal Medicine

## 2016-08-13 DIAGNOSIS — G4730 Sleep apnea, unspecified: Secondary | ICD-10-CM | POA: Diagnosis not present

## 2016-08-20 ENCOUNTER — Ambulatory Visit: Payer: BLUE CROSS/BLUE SHIELD

## 2016-08-24 ENCOUNTER — Ambulatory Visit: Payer: BLUE CROSS/BLUE SHIELD | Attending: Internal Medicine

## 2016-08-24 DIAGNOSIS — G4733 Obstructive sleep apnea (adult) (pediatric): Secondary | ICD-10-CM | POA: Diagnosis not present

## 2016-08-30 ENCOUNTER — Encounter: Payer: Self-pay | Admitting: Family Medicine

## 2016-08-30 ENCOUNTER — Telehealth: Payer: Self-pay | Admitting: Family Medicine

## 2016-08-30 NOTE — Telephone Encounter (Signed)
Patient stated that she put in a refill request to her pharmacy that will be sending the request here for 4 of her RX maintenance prescriptions.  Patient is requesting a 90 day supply in order to save her money due to her not working at this time.

## 2016-09-02 ENCOUNTER — Other Ambulatory Visit: Payer: Self-pay

## 2016-09-02 ENCOUNTER — Emergency Department
Admission: EM | Admit: 2016-09-02 | Discharge: 2016-09-02 | Disposition: A | Discharge status: Home / Self Care | Payer: BLUE CROSS/BLUE SHIELD | Attending: Emergency Medicine | Admitting: Emergency Medicine

## 2016-09-02 ENCOUNTER — Emergency Department: Payer: BLUE CROSS/BLUE SHIELD

## 2016-09-02 ENCOUNTER — Encounter: Payer: Self-pay | Admitting: Family Medicine

## 2016-09-02 ENCOUNTER — Encounter: Payer: Self-pay | Admitting: *Deleted

## 2016-09-02 DIAGNOSIS — Z87891 Personal history of nicotine dependence: Secondary | ICD-10-CM | POA: Insufficient documentation

## 2016-09-02 DIAGNOSIS — I10 Essential (primary) hypertension: Secondary | ICD-10-CM | POA: Insufficient documentation

## 2016-09-02 DIAGNOSIS — Z791 Long term (current) use of non-steroidal anti-inflammatories (NSAID): Secondary | ICD-10-CM | POA: Diagnosis not present

## 2016-09-02 DIAGNOSIS — Z79899 Other long term (current) drug therapy: Secondary | ICD-10-CM | POA: Insufficient documentation

## 2016-09-02 DIAGNOSIS — M5410 Radiculopathy, site unspecified: Secondary | ICD-10-CM

## 2016-09-02 DIAGNOSIS — J45909 Unspecified asthma, uncomplicated: Secondary | ICD-10-CM | POA: Insufficient documentation

## 2016-09-02 DIAGNOSIS — M25511 Pain in right shoulder: Secondary | ICD-10-CM | POA: Diagnosis present

## 2016-09-02 DIAGNOSIS — E039 Hypothyroidism, unspecified: Secondary | ICD-10-CM | POA: Insufficient documentation

## 2016-09-02 LAB — BASIC METABOLIC PANEL
ANION GAP: 7 (ref 5–15)
BUN: 12 mg/dL (ref 6–20)
CHLORIDE: 103 mmol/L (ref 101–111)
CO2: 30 mmol/L (ref 22–32)
Calcium: 9.7 mg/dL (ref 8.9–10.3)
Creatinine, Ser: 0.81 mg/dL (ref 0.44–1.00)
GFR calc Af Amer: >60 mL/min (ref >60)
GLUCOSE: 101 mg/dL — AB (ref 65–99)
POTASSIUM: 3.9 mmol/L (ref 3.5–5.1)
Sodium: 140 mmol/L (ref 135–145)

## 2016-09-02 LAB — CBC
HEMATOCRIT: 40.6 % (ref 35.0–47.0)
HEMOGLOBIN: 14 g/dL (ref 12.0–16.0)
MCH: 28.9 pg (ref 26.0–34.0)
MCHC: 34.4 g/dL (ref 32.0–36.0)
MCV: 84 fL (ref 80.0–100.0)
Platelets: 363 10*3/uL (ref 150–440)
RBC: 4.84 MIL/uL (ref 3.80–5.20)
RDW: 14.7 % — ABNORMAL HIGH (ref 11.5–14.5)
WBC: 7.7 10*3/uL (ref 3.6–11.0)

## 2016-09-02 LAB — TROPONIN I: Troponin I: <0.03 ng/mL (ref <0.03)

## 2016-09-02 MED ORDER — ACETAMINOPHEN 500 MG PO TABS
ORAL_TABLET | ORAL | Status: AC
  Administered 2016-09-02: 1000 mg via ORAL
  Filled 2016-09-02: qty 2

## 2016-09-02 MED ORDER — KETOROLAC TROMETHAMINE 60 MG/2ML IM SOLN
INTRAMUSCULAR | Status: AC
  Administered 2016-09-02: 60 mg via INTRAMUSCULAR
  Filled 2016-09-02: qty 2

## 2016-09-02 MED ORDER — OXYCODONE HCL 5 MG PO TABS
ORAL_TABLET | ORAL | Status: AC
  Administered 2016-09-02: 5 mg via ORAL
  Filled 2016-09-02: qty 1

## 2016-09-02 MED ORDER — OXYCODONE-ACETAMINOPHEN 5-325 MG PO TABS: 1.0000 | ORAL_TABLET | ORAL | 0 refills | Status: DC | PRN

## 2016-09-02 MED ORDER — KETOROLAC TROMETHAMINE 60 MG/2ML IM SOLN
60.0000 mg | Freq: Once | INTRAMUSCULAR | Status: AC
  Administered 2016-09-02: 60 mg via INTRAMUSCULAR

## 2016-09-02 MED ORDER — ACETAMINOPHEN 500 MG PO TABS
1000.0000 mg | ORAL_TABLET | Freq: Once | ORAL | Status: AC
  Administered 2016-09-02: 1000 mg via ORAL

## 2016-09-02 MED ORDER — OXYCODONE HCL 5 MG PO TABS
5.0000 mg | ORAL_TABLET | Freq: Once | ORAL | Status: AC
  Administered 2016-09-02: 5 mg via ORAL

## 2016-09-02 NOTE — ED Notes (Signed)
Pt drove herself to the ER - states she can get a ride home (giving her a narcotic).

## 2016-09-02 NOTE — ED Triage Notes (Signed)
Pt reports right side shoulder/neck pain for 3 days.  No known injury.  Pain began after getting up in the morning.  Pt taking tylenol/motrin without pain relief.  Intermittent nausea.  No chest pain. Pt alert speech clear.
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
## 2016-09-02 NOTE — ED Notes (Signed)
Pt ambulated to room. Pt states rt shoulder pain that goes into her neck. Denies injury. States tylenol and motrin "don't work". No cp, intermittent nausea with the pain.

## 2016-09-12 ENCOUNTER — Encounter: Payer: Self-pay | Admitting: Family Medicine

## 2016-09-13 ENCOUNTER — Telehealth: Payer: Self-pay | Admitting: *Deleted

## 2016-09-13 DIAGNOSIS — G4733 Obstructive sleep apnea (adult) (pediatric): Secondary | ICD-10-CM

## 2016-09-13 NOTE — Telephone Encounter (Signed)
LMOM for pt to return call in regards to CPAP titration.

## 2016-09-13 NOTE — Telephone Encounter (Signed)
Laura [name], please see note about calling pharmacy; thank you

## 2016-09-13 NOTE — Telephone Encounter (Signed)
Laura [name], We do not have any record of prescribing tizanidine Please call her pharmacy and get fill hx for this for the last year with prescriber data Thank you

## 2016-09-14 NOTE — Telephone Encounter (Signed)
LMOM for pt to return call. 

## 2016-09-14 NOTE — Telephone Encounter (Signed)
Laura [name], please see my two previous notes about calling the pharmacy Thank you

## 2016-09-15 NOTE — Telephone Encounter (Signed)
LMOM for pt to return call. 

## 2016-09-16 NOTE — Telephone Encounter (Signed)
Pt informed we will be placing order for her CPAP machine. Order placed. Nothing further needed.

## 2016-09-20 ENCOUNTER — Ambulatory Visit: Payer: BLUE CROSS/BLUE SHIELD | Admitting: Internal Medicine

## 2016-10-05 ENCOUNTER — Ambulatory Visit: Payer: BLUE CROSS/BLUE SHIELD | Admitting: Cardiology

## 2016-10-11 DIAGNOSIS — J45909 Unspecified asthma, uncomplicated: Secondary | ICD-10-CM

## 2016-10-11 DIAGNOSIS — E079 Disorder of thyroid, unspecified: Secondary | ICD-10-CM

## 2016-10-11 DIAGNOSIS — F1190 Opioid use, unspecified, uncomplicated: Secondary | ICD-10-CM | POA: Insufficient documentation

## 2016-10-11 DIAGNOSIS — E785 Hyperlipidemia, unspecified: Secondary | ICD-10-CM | POA: Insufficient documentation

## 2016-10-11 DIAGNOSIS — Z79891 Long term (current) use of opiate analgesic: Secondary | ICD-10-CM | POA: Insufficient documentation

## 2016-10-11 DIAGNOSIS — N189 Chronic kidney disease, unspecified: Secondary | ICD-10-CM | POA: Insufficient documentation

## 2016-10-11 NOTE — Progress Notes (Signed)
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 Long term prescription opiate use   4. Opiate use   5. Fibromyalgia   6. Osteoarthrosis, generalized, multiple joints   7. Chronic bilateral low back pain without sciatica   8. Lumbar facet joint syndrome   9. Chronic sacroiliac joint pain   10. Vitamin D deficiency   11. Vitamin B12 deficiency   12. SOB (shortness of breath) on exertion   13. Bilateral lower extremity edema   14. Chronic shoulder pain (Location of Primary Source of Pain) (Bilateral) (R>L)   15. Chronic neck pain (Location of Secondary source of pain) (Bilateral) (R>L)   16. Chronic upper back pain (Bilateral) (L>R)   17. Chronic hand pain, unspecified laterality   18. Chronic hand pain (Right)   19. Chronic hand pain (Left)   20. Chronic knee pain (Right)   21. Osteoarthritis of knee (Right)   22. Musculoskeletal pain    Plan of Care  Initial treatment plan:  Please be advised that as per protocol, today's visit has been an evaluation only. We  have not taken over the patient's controlled substance management.  Problem-specific plan: No problem-specific Assessment & Plan notes found for this encounter.  Ordered Lab-work, Procedure(s), Referral(s), & Consult(s): Orders Placed This Encounter  Procedures  . DG Lumbar Spine Complete W/Bend  . DG Si Joints  . DG Shoulder Left  . DG Shoulder Right  . DG Cervical Spine Complete  . DG Knee 1-2 Views Right  . DG Hand Complete Left  . DG Hand Complete Right  . Compliance Drug Analysis, Ur  . Comprehensive metabolic panel  . C-reactive protein  . Magnesium  . Sedimentation rate  . Vitamin B12  . 25-Hydroxyvitamin D Lcms D2+D3  . Brain natriuretic peptide  . Ambulatory referral to Psychology   Pharmacotherapy: Medications ordered:  No orders of the defined types were placed in this encounter.  Medications administered during this visit: Laura [name] had no medications administered during this visit.   Pharmacotherapy under consideration:  Opioid Analgesics: The patient was informed that there is no guarantee that she would be a candidate for opioid analgesics. The decision will be made following CDC guidelines. This decision will be based on the results of diagnostic studies, as well as Laura [name]'s risk profile.  Membrane stabilizer: To be determined at a later time Muscle relaxant: To be determined at a later time NSAID: To be determined at a later time Other analgesic(s): To be determined at a later time   Interventional therapies under consideration: Laura [name] was informed that there is no guarantee that she would be a candidate for interventional therapies. The decision will be based on the results of diagnostic studies, as well as Laura [name]'s risk profile.  Possible procedure(s): Diagnostic bilateral intra-articular shoulder joint injection under fluoroscopic guidance  Diagnostic bilateral suprascapular nerve block under fluoroscopic guidance  Possible bilateral  suprascapular nerve radiofrequency ablation  Right cervical epidural steroid injection under fluoroscopic guidance  Diagnostic bilateral cervical facet block under fluoroscopic guidance  Diagnostic bilateral lumbar facet block  Possible bilateral lumbar facet radiofrequency ablation  Right intra-articular knee joint injection with local anesthetic and steroid  Series of 5 intra-articular Hyalgan knee injections for the right knee  Diagnostic right knee genicular nerve blocks  Possible right knee genicular radiofrequency ablation    Provider-requested follow-up: Return for 2nd Visit, after [facility] evaluation.  Future Appointments Date Time Provider [place]  10/13/2016 8:40 AM Arnetha Courser, MD [place] None  12/02/2016 11:30 AM Flora Lipps, MD [facility] None    Primary Care Physician: Arnetha Courser, MD Location: [facility] Note by: Kathlen Brunswick Dossie Arbour, M.D, DABA, DABAPM, DABPM, DABIPP, FIPP Date: 10/12/16; Time: 10:42 AM  Pain Score Disclaimer: We use the NRS-11 scale. This is a self-reported, subjective measurement of pain severity with only modest accuracy. It is used primarily to identify changes within a particular patient. It must be understood that outpatient pain scales are significantly less accurate that those used for research, where they can be applied under ideal controlled circumstances with minimal exposure to variables. In reality, the score is likely to be a combination of pain intensity and pain affect, where pain affect describes the degree of emotional arousal or changes in action readiness caused by the sensory experience of pain. Factors such as social and work situation, setting, emotional state, anxiety levels, expectation, and prior pain experience may influence pain perception and show large inter-individual differences that may also be affected by time variables.  Patient instructions provided during this  appointment: There are no Patient Instructions on file for this visit.

## 2016-10-12 ENCOUNTER — Ambulatory Visit
Admission: RE | Admit: 2016-10-12 | Discharge: 2016-10-12 | Disposition: A | Discharge status: Home / Self Care | Payer: BLUE CROSS/BLUE SHIELD | Source: Ambulatory Visit | Attending: Pain Medicine | Admitting: Pain Medicine

## 2016-10-12 ENCOUNTER — Ambulatory Visit: Payer: BLUE CROSS/BLUE SHIELD | Attending: Pain Medicine | Admitting: Pain Medicine

## 2016-10-12 ENCOUNTER — Other Ambulatory Visit
Admission: RE | Admit: 2016-10-12 | Discharge: 2016-10-12 | Disposition: A | Discharge status: Home / Self Care | Payer: BLUE CROSS/BLUE SHIELD | Source: Ambulatory Visit | Attending: Pain Medicine | Admitting: Pain Medicine

## 2016-10-12 ENCOUNTER — Encounter: Payer: Self-pay | Admitting: Pain Medicine

## 2016-10-12 VITALS — BP 155/84 | HR 90 | Temp 98.4°F | Resp 16 | Ht 65.0 in | Wt 197.0 lb

## 2016-10-12 DIAGNOSIS — M533 Sacrococcygeal disorders, not elsewhere classified: Secondary | ICD-10-CM

## 2016-10-12 DIAGNOSIS — M542 Cervicalgia: Secondary | ICD-10-CM | POA: Diagnosis not present

## 2016-10-12 DIAGNOSIS — Z79891 Long term (current) use of opiate analgesic: Secondary | ICD-10-CM | POA: Diagnosis not present

## 2016-10-12 DIAGNOSIS — Z803 Family history of malignant neoplasm of breast: Secondary | ICD-10-CM | POA: Insufficient documentation

## 2016-10-12 DIAGNOSIS — R0602 Shortness of breath: Secondary | ICD-10-CM | POA: Insufficient documentation

## 2016-10-12 DIAGNOSIS — M797 Fibromyalgia: Secondary | ICD-10-CM | POA: Diagnosis not present

## 2016-10-12 DIAGNOSIS — M79642 Pain in left hand: Secondary | ICD-10-CM | POA: Diagnosis not present

## 2016-10-12 DIAGNOSIS — J4520 Mild intermittent asthma, uncomplicated: Secondary | ICD-10-CM | POA: Diagnosis not present

## 2016-10-12 DIAGNOSIS — G8929 Other chronic pain: Secondary | ICD-10-CM

## 2016-10-12 DIAGNOSIS — M50321 Other cervical disc degeneration at C4-C5 level: Secondary | ICD-10-CM | POA: Insufficient documentation

## 2016-10-12 DIAGNOSIS — E559 Vitamin D deficiency, unspecified: Secondary | ICD-10-CM

## 2016-10-12 DIAGNOSIS — G894 Chronic pain syndrome: Secondary | ICD-10-CM

## 2016-10-12 DIAGNOSIS — M25512 Pain in left shoulder: Secondary | ICD-10-CM

## 2016-10-12 DIAGNOSIS — G629 Polyneuropathy, unspecified: Secondary | ICD-10-CM | POA: Insufficient documentation

## 2016-10-12 DIAGNOSIS — M19042 Primary osteoarthritis, left hand: Secondary | ICD-10-CM | POA: Diagnosis not present

## 2016-10-12 DIAGNOSIS — M79643 Pain in unspecified hand: Secondary | ICD-10-CM

## 2016-10-12 DIAGNOSIS — Z87891 Personal history of nicotine dependence: Secondary | ICD-10-CM | POA: Insufficient documentation

## 2016-10-12 DIAGNOSIS — E7800 Pure hypercholesterolemia, unspecified: Secondary | ICD-10-CM | POA: Diagnosis not present

## 2016-10-12 DIAGNOSIS — M25511 Pain in right shoulder: Secondary | ICD-10-CM | POA: Insufficient documentation

## 2016-10-12 DIAGNOSIS — G4700 Insomnia, unspecified: Secondary | ICD-10-CM | POA: Diagnosis not present

## 2016-10-12 DIAGNOSIS — R600 Localized edema: Secondary | ICD-10-CM | POA: Insufficient documentation

## 2016-10-12 DIAGNOSIS — F329 Major depressive disorder, single episode, unspecified: Secondary | ICD-10-CM | POA: Insufficient documentation

## 2016-10-12 DIAGNOSIS — M546 Pain in thoracic spine: Secondary | ICD-10-CM | POA: Insufficient documentation

## 2016-10-12 DIAGNOSIS — M545 Low back pain, unspecified: Secondary | ICD-10-CM | POA: Insufficient documentation

## 2016-10-12 DIAGNOSIS — M19012 Primary osteoarthritis, left shoulder: Secondary | ICD-10-CM | POA: Insufficient documentation

## 2016-10-12 DIAGNOSIS — M50322 Other cervical disc degeneration at C5-C6 level: Secondary | ICD-10-CM | POA: Insufficient documentation

## 2016-10-12 DIAGNOSIS — M25561 Pain in right knee: Secondary | ICD-10-CM | POA: Insufficient documentation

## 2016-10-12 DIAGNOSIS — M5031 Other cervical disc degeneration,  high cervical region: Secondary | ICD-10-CM | POA: Diagnosis not present

## 2016-10-12 DIAGNOSIS — M47816 Spondylosis without myelopathy or radiculopathy, lumbar region: Secondary | ICD-10-CM | POA: Insufficient documentation

## 2016-10-12 DIAGNOSIS — I129 Hypertensive chronic kidney disease with stage 1 through stage 4 chronic kidney disease, or unspecified chronic kidney disease: Secondary | ICD-10-CM | POA: Diagnosis not present

## 2016-10-12 DIAGNOSIS — M7918 Myalgia, other site: Secondary | ICD-10-CM

## 2016-10-12 DIAGNOSIS — H11002 Unspecified pterygium of left eye: Secondary | ICD-10-CM | POA: Diagnosis not present

## 2016-10-12 DIAGNOSIS — M791 Myalgia: Secondary | ICD-10-CM

## 2016-10-12 DIAGNOSIS — M5136 Other intervertebral disc degeneration, lumbar region: Secondary | ICD-10-CM | POA: Insufficient documentation

## 2016-10-12 DIAGNOSIS — I509 Heart failure, unspecified: Secondary | ICD-10-CM | POA: Insufficient documentation

## 2016-10-12 DIAGNOSIS — K219 Gastro-esophageal reflux disease without esophagitis: Secondary | ICD-10-CM | POA: Insufficient documentation

## 2016-10-12 DIAGNOSIS — M1990 Unspecified osteoarthritis, unspecified site: Secondary | ICD-10-CM | POA: Diagnosis not present

## 2016-10-12 DIAGNOSIS — N951 Menopausal and female climacteric states: Secondary | ICD-10-CM | POA: Insufficient documentation

## 2016-10-12 DIAGNOSIS — Z91013 Allergy to seafood: Secondary | ICD-10-CM | POA: Insufficient documentation

## 2016-10-12 DIAGNOSIS — M19041 Primary osteoarthritis, right hand: Secondary | ICD-10-CM | POA: Insufficient documentation

## 2016-10-12 DIAGNOSIS — R739 Hyperglycemia, unspecified: Secondary | ICD-10-CM | POA: Diagnosis not present

## 2016-10-12 DIAGNOSIS — I7300 Raynaud's syndrome without gangrene: Secondary | ICD-10-CM | POA: Insufficient documentation

## 2016-10-12 DIAGNOSIS — E039 Hypothyroidism, unspecified: Secondary | ICD-10-CM | POA: Insufficient documentation

## 2016-10-12 DIAGNOSIS — N189 Chronic kidney disease, unspecified: Secondary | ICD-10-CM | POA: Insufficient documentation

## 2016-10-12 DIAGNOSIS — E669 Obesity, unspecified: Secondary | ICD-10-CM | POA: Diagnosis not present

## 2016-10-12 DIAGNOSIS — Z823 Family history of stroke: Secondary | ICD-10-CM | POA: Insufficient documentation

## 2016-10-12 DIAGNOSIS — E538 Deficiency of other specified B group vitamins: Secondary | ICD-10-CM

## 2016-10-12 DIAGNOSIS — F1190 Opioid use, unspecified, uncomplicated: Secondary | ICD-10-CM

## 2016-10-12 DIAGNOSIS — M549 Dorsalgia, unspecified: Secondary | ICD-10-CM

## 2016-10-12 DIAGNOSIS — E876 Hypokalemia: Secondary | ICD-10-CM | POA: Insufficient documentation

## 2016-10-12 DIAGNOSIS — M1288 Other specific arthropathies, not elsewhere classified, other specified site: Secondary | ICD-10-CM

## 2016-10-12 DIAGNOSIS — M1711 Unilateral primary osteoarthritis, right knee: Secondary | ICD-10-CM | POA: Insufficient documentation

## 2016-10-12 DIAGNOSIS — Z90711 Acquired absence of uterus with remaining cervical stump: Secondary | ICD-10-CM | POA: Insufficient documentation

## 2016-10-12 DIAGNOSIS — M79641 Pain in right hand: Secondary | ICD-10-CM | POA: Diagnosis not present

## 2016-10-12 DIAGNOSIS — M159 Polyosteoarthritis, unspecified: Secondary | ICD-10-CM

## 2016-10-12 LAB — MAGNESIUM: Magnesium: 1.8 mg/dL (ref 1.7–2.4)

## 2016-10-12 LAB — COMPREHENSIVE METABOLIC PANEL WITH GFR
ALT: 21 U/L (ref 14–54)
AST: 25 U/L (ref 15–41)
Albumin: 4.4 g/dL (ref 3.5–5.0)
Alkaline Phosphatase: 57 U/L (ref 38–126)
Anion gap: 7 (ref 5–15)
BUN: 17 mg/dL (ref 6–20)
CO2: 29 mmol/L (ref 22–32)
Calcium: 9.8 mg/dL (ref 8.9–10.3)
Chloride: 101 mmol/L (ref 101–111)
Creatinine, Ser: 0.67 mg/dL (ref 0.44–1.00)
GFR calc Af Amer: >60 mL/min
GFR calc non Af Amer: >60 mL/min
Glucose, Bld: 113 mg/dL — ABNORMAL HIGH (ref 65–99)
Potassium: 3.6 mmol/L (ref 3.5–5.1)
Sodium: 137 mmol/L (ref 135–145)
Total Bilirubin: 0.6 mg/dL (ref 0.3–1.2)
Total Protein: 8 g/dL (ref 6.5–8.1)

## 2016-10-12 LAB — SEDIMENTATION RATE: SED RATE: 6 mm/h (ref 0–30)

## 2016-10-12 LAB — BRAIN NATRIURETIC PEPTIDE: B Natriuretic Peptide: 11 pg/mL (ref 0.0–100.0)

## 2016-10-12 LAB — VITAMIN B12: Vitamin B-12: 689 pg/mL (ref 180–914)

## 2016-10-12 LAB — C-REACTIVE PROTEIN: CRP: <0.8 mg/dL (ref <1.0)

## 2016-10-12 NOTE — Progress Notes (Signed)
Safety precautions to be maintained throughout the outpatient stay will include: orient to surroundings, keep bed in low position, maintain call bell within reach at all times, provide assistance with transfer out of bed and ambulation.  

## 2016-10-12 NOTE — Progress Notes (Signed)
-   Normal fasting (NPO x 8 hours) glucose levels are between 65-99 mg/dl, with 2 hour fasting, levels are usually less than 140 mg/dl. Any random blood glucose level greater than 200 mg/dl is considered to be Diabetes.

## 2016-10-13 ENCOUNTER — Ambulatory Visit: Payer: BLUE CROSS/BLUE SHIELD | Admitting: Family Medicine

## 2016-10-15 LAB — 25-HYDROXY VITAMIN D LCMS D2+D3
25-Hydroxy, Vitamin D-2: 19 ng/mL
25-Hydroxy, Vitamin D-3: 42 ng/mL

## 2016-10-15 LAB — 25-HYDROXYVITAMIN D LCMS D2+D3: 25-HYDROXY, VITAMIN D: 61 ng/mL

## 2016-10-16 LAB — COMPLIANCE DRUG ANALYSIS, UR

## 2016-10-18 ENCOUNTER — Encounter: Payer: Self-pay | Admitting: Family Medicine

## 2016-10-18 ENCOUNTER — Ambulatory Visit (INDEPENDENT_AMBULATORY_CARE_PROVIDER_SITE_OTHER): Payer: BLUE CROSS/BLUE SHIELD | Admitting: Family Medicine

## 2016-10-18 DIAGNOSIS — R739 Hyperglycemia, unspecified: Secondary | ICD-10-CM

## 2016-10-18 DIAGNOSIS — R519 Headache, unspecified: Secondary | ICD-10-CM

## 2016-10-18 DIAGNOSIS — R51 Headache: Secondary | ICD-10-CM | POA: Diagnosis not present

## 2016-10-18 DIAGNOSIS — R479 Unspecified speech disturbances: Secondary | ICD-10-CM | POA: Insufficient documentation

## 2016-10-18 DIAGNOSIS — N182 Chronic kidney disease, stage 2 (mild): Secondary | ICD-10-CM

## 2016-10-18 DIAGNOSIS — E039 Hypothyroidism, unspecified: Secondary | ICD-10-CM | POA: Diagnosis not present

## 2016-10-18 DIAGNOSIS — R413 Other amnesia: Secondary | ICD-10-CM | POA: Diagnosis not present

## 2016-10-18 DIAGNOSIS — E785 Hyperlipidemia, unspecified: Secondary | ICD-10-CM | POA: Diagnosis not present

## 2016-10-18 DIAGNOSIS — M62838 Other muscle spasm: Secondary | ICD-10-CM

## 2016-10-18 MED ORDER — TIZANIDINE HCL 4 MG PO TABS: 4.0000 mg | ORAL_TABLET | Freq: Three times a day (TID) | ORAL | 0 refills | Status: DC | PRN

## 2016-10-18 NOTE — Assessment & Plan Note (Signed)
Check A1c. 

## 2016-10-18 NOTE — Assessment & Plan Note (Signed)
Check lipids tomorrow

## 2016-10-18 NOTE — Assessment & Plan Note (Signed)
Check labs; avoid NSAIDs 

## 2016-10-18 NOTE — Assessment & Plan Note (Signed)
Check Mg2+ and refer to neurologist
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
## 2016-10-18 NOTE — Patient Instructions (Addendum)
Try probiotics or kimchi or yogurt with live cultures Return for fasting labs tomorrow Return in the next 2 weeks for further work-up

## 2016-10-18 NOTE — Assessment & Plan Note (Signed)
Refer to neurologist; not sure if fibromyalgia, but she is having other neurologic symptoms and would like specialist evaluation

## 2016-10-18 NOTE — Assessment & Plan Note (Signed)
Refer to neurologist for new headaches with other neurologic symptoms

## 2016-10-18 NOTE — Assessment & Plan Note (Signed)
Check TSH 

## 2016-10-19 ENCOUNTER — Encounter: Payer: Self-pay | Admitting: Family Medicine

## 2016-10-19 ENCOUNTER — Other Ambulatory Visit: Payer: Self-pay

## 2016-10-19 DIAGNOSIS — R739 Hyperglycemia, unspecified: Secondary | ICD-10-CM

## 2016-10-19 DIAGNOSIS — E785 Hyperlipidemia, unspecified: Secondary | ICD-10-CM

## 2016-10-19 LAB — LIPID PANEL
CHOL/HDL RATIO: 5.4 ratio — AB (ref <5.0)
Cholesterol: 281 mg/dL — ABNORMAL HIGH (ref <200)
HDL: 52 mg/dL (ref >50)
LDL CALC: 187 mg/dL — AB (ref <100)
Triglycerides: 212 mg/dL — ABNORMAL HIGH (ref <150)
VLDL: 42 mg/dL — AB (ref <30)

## 2016-10-19 LAB — POTASSIUM: Potassium: 4.5 mmol/L (ref 3.5–5.3)

## 2016-10-19 LAB — HEMOGLOBIN A1C
HEMOGLOBIN A1C: 5.7 % — AB (ref <5.7)
Mean Plasma Glucose: 117 mg/dL

## 2016-10-19 LAB — MAGNESIUM: Magnesium: 2 mg/dL (ref 1.5–2.5)

## 2016-10-21 ENCOUNTER — Ambulatory Visit: Payer: BLUE CROSS/BLUE SHIELD | Admitting: Family Medicine

## 2016-10-28 ENCOUNTER — Encounter: Payer: Self-pay | Admitting: Family Medicine

## 2016-10-28 ENCOUNTER — Ambulatory Visit (INDEPENDENT_AMBULATORY_CARE_PROVIDER_SITE_OTHER): Payer: BLUE CROSS/BLUE SHIELD | Admitting: Family Medicine

## 2016-10-28 DIAGNOSIS — R7303 Prediabetes: Secondary | ICD-10-CM | POA: Diagnosis not present

## 2016-10-28 DIAGNOSIS — E785 Hyperlipidemia, unspecified: Secondary | ICD-10-CM | POA: Diagnosis not present

## 2016-10-28 MED ORDER — COLESEVELAM HCL 625 MG PO TABS: 1875.0000 mg | ORAL_TABLET | Freq: Two times a day (BID) | ORAL | 0 refills | Status: DC

## 2016-10-28 NOTE — Assessment & Plan Note (Signed)
Start welchol, limit sweets, whites like she is doing; work on weight loss; discussed importance of weight loss to help prevent progression to diabetes; limited with activity, understood
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
## 2016-10-28 NOTE — Patient Instructions (Signed)
Titrate up on the Welchol, start with just one pill a day with a meal; then increase by one pill every 3-5 days as tolerated Goal is three pills with morning and evening meals (6 per day total) Try to limit saturated fats in your diet (bologna, hot dogs, barbeque, cheeseburgers, hamburgers, steak, bacon, sausage, cheese, etc.) and get more fresh fruits, vegetables, and whole grains Return in 3 months for fasting labs

## 2016-10-28 NOTE — Assessment & Plan Note (Signed)
Intolerant to statins; start welchol because of benefit for lipids and A1c; gradually titrate, one pill every few days

## 2016-11-01 ENCOUNTER — Other Ambulatory Visit: Payer: Self-pay | Admitting: Family Medicine

## 2016-11-01 DIAGNOSIS — K219 Gastro-esophageal reflux disease without esophagitis: Secondary | ICD-10-CM

## 2016-11-01 DIAGNOSIS — K2270 Barrett's esophagus without dysplasia: Secondary | ICD-10-CM

## 2016-11-02 ENCOUNTER — Other Ambulatory Visit: Payer: Self-pay | Admitting: Family Medicine

## 2016-11-02 DIAGNOSIS — K2270 Barrett's esophagus without dysplasia: Secondary | ICD-10-CM | POA: Insufficient documentation

## 2016-11-02 MED ORDER — CHOLESTYRAMINE 4 GM/DOSE PO POWD: 4.0000 g | Freq: Three times a day (TID) | ORAL | 12 refills | Status: DC

## 2016-11-02 NOTE — Telephone Encounter (Signed)
Glitch in computer system; I was not getting refill requests; addressing today ------------------------- [place] per Dr. Celesta Aver note; Rx approved

## 2016-11-02 NOTE — Assessment & Plan Note (Signed)
Continue PPI ?

## 2016-11-02 NOTE — Progress Notes (Signed)
Welchol not covered by pt's insurance Try cholestyramine

## 2016-11-04 ENCOUNTER — Other Ambulatory Visit: Payer: Self-pay

## 2016-11-04 MED ORDER — FUROSEMIDE 20 MG PO TABS: 20.0000 mg | ORAL_TABLET | Freq: Every day | ORAL | 2 refills | Status: DC

## 2016-11-04 NOTE — Telephone Encounter (Signed)
Yes, I already switched to [place]; thanks

## 2016-11-04 NOTE — Telephone Encounter (Signed)
Also prior auth for welchol was denied can you switch to something else?

## 2016-12-01 ENCOUNTER — Telehealth: Payer: Self-pay | Admitting: Family Medicine

## 2016-12-01 MED ORDER — LEVOTHYROXINE SODIUM 125 MCG PO TABS: 125.0000 ug | ORAL_TABLET | Freq: Every day | ORAL | 0 refills | Status: DC

## 2016-12-01 NOTE — Telephone Encounter (Signed)
Lab Results  Component Value Date   TSH 3.060 12/16/2015    rx approved

## 2016-12-01 NOTE — Telephone Encounter (Signed)
Pt has changed pharmacy and is asking that you send levothyroxine 125 mg 90 day supply to [facility] [address]. As of today patient is completely out.

## 2016-12-02 ENCOUNTER — Ambulatory Visit: Payer: BLUE CROSS/BLUE SHIELD | Admitting: Internal Medicine

## 2016-12-30 ENCOUNTER — Other Ambulatory Visit: Payer: Self-pay

## 2016-12-30 MED ORDER — FUROSEMIDE 20 MG PO TABS: 20.0000 mg | ORAL_TABLET | Freq: Every day | ORAL | 1 refills | Status: DC

## 2016-12-30 NOTE — Telephone Encounter (Signed)
Patient needs 90 day supply

## 2017-01-27 ENCOUNTER — Encounter: Payer: Self-pay | Admitting: Family Medicine

## 2017-01-27 ENCOUNTER — Ambulatory Visit (INDEPENDENT_AMBULATORY_CARE_PROVIDER_SITE_OTHER): Payer: BLUE CROSS/BLUE SHIELD | Admitting: Family Medicine

## 2017-01-27 VITALS — BP 160/108 | HR 90 | Temp 98.8°F | Resp 16 | Wt 200.1 lb

## 2017-01-27 DIAGNOSIS — R7303 Prediabetes: Secondary | ICD-10-CM | POA: Diagnosis not present

## 2017-01-27 DIAGNOSIS — K2270 Barrett's esophagus without dysplasia: Secondary | ICD-10-CM

## 2017-01-27 DIAGNOSIS — R002 Palpitations: Secondary | ICD-10-CM | POA: Diagnosis not present

## 2017-01-27 DIAGNOSIS — I10 Essential (primary) hypertension: Secondary | ICD-10-CM | POA: Diagnosis not present

## 2017-01-27 DIAGNOSIS — E559 Vitamin D deficiency, unspecified: Secondary | ICD-10-CM

## 2017-01-27 DIAGNOSIS — N182 Chronic kidney disease, stage 2 (mild): Secondary | ICD-10-CM

## 2017-01-27 DIAGNOSIS — Z5181 Encounter for therapeutic drug level monitoring: Secondary | ICD-10-CM | POA: Diagnosis not present

## 2017-01-27 DIAGNOSIS — E785 Hyperlipidemia, unspecified: Secondary | ICD-10-CM

## 2017-01-27 DIAGNOSIS — E876 Hypokalemia: Secondary | ICD-10-CM

## 2017-01-27 LAB — COMPLETE METABOLIC PANEL WITH GFR
ALT: 18 U/L (ref 6–29)
AST: 18 U/L (ref 10–35)
Albumin: 3.8 g/dL (ref 3.6–5.1)
Alkaline Phosphatase: 64 U/L (ref 33–130)
BUN: 10 mg/dL (ref 7–25)
CALCIUM: 9.8 mg/dL (ref 8.6–10.4)
CHLORIDE: 102 mmol/L (ref 98–110)
CO2: 27 mmol/L (ref 20–31)
Creat: 0.74 mg/dL (ref 0.50–1.05)
Glucose, Bld: 110 mg/dL — ABNORMAL HIGH (ref 65–99)
POTASSIUM: 4.6 mmol/L (ref 3.5–5.3)
Sodium: 141 mmol/L (ref 135–146)
Total Bilirubin: 0.3 mg/dL (ref 0.2–1.2)
Total Protein: 6.7 g/dL (ref 6.1–8.1)

## 2017-01-27 LAB — CBC WITH DIFFERENTIAL/PLATELET
BASOS PCT: 1 %
Basophils Absolute: 52 cells/uL (ref 0–200)
Eosinophils Absolute: 364 cells/uL (ref 15–500)
Eosinophils Relative: 7 %
HCT: 41.9 % (ref 35.0–45.0)
HEMOGLOBIN: 13.5 g/dL (ref 11.7–15.5)
LYMPHS ABS: 1924 {cells}/uL (ref 850–3900)
Lymphocytes Relative: 37 %
MCH: 28.2 pg (ref 27.0–33.0)
MCHC: 32.2 g/dL (ref 32.0–36.0)
MCV: 87.7 fL (ref 80.0–100.0)
MONO ABS: 364 {cells}/uL (ref 200–950)
MPV: 8.5 fL (ref 7.5–12.5)
Monocytes Relative: 7 %
NEUTROS ABS: 2496 {cells}/uL (ref 1500–7800)
Neutrophils Relative %: 48 %
Platelets: 366 10*3/uL (ref 140–400)
RBC: 4.78 MIL/uL (ref 3.80–5.10)
RDW: 13.6 % (ref 11.0–15.0)
WBC: 5.2 10*3/uL (ref 3.8–10.8)

## 2017-01-27 LAB — TSH: TSH: 2.04 m[IU]/L

## 2017-01-27 MED ORDER — VALSARTAN 320 MG PO TABS: 320.0000 mg | ORAL_TABLET | Freq: Every day | ORAL | 0 refills | Status: DC

## 2017-01-27 MED ORDER — AMOXICILLIN-POT CLAVULANATE 875-125 MG PO TABS: 1.0000 | ORAL_TABLET | Freq: Two times a day (BID) | ORAL | 0 refills | Status: AC

## 2017-01-27 NOTE — Assessment & Plan Note (Signed)
Check lipids; avoid saturated fats 

## 2017-01-27 NOTE — Assessment & Plan Note (Signed)
Try DASH guidelines, increase ARB; return in 2 weeks for recheck and BMP

## 2017-01-27 NOTE — Assessment & Plan Note (Signed)
Taking PPI; no blood in stool

## 2017-01-27 NOTE — Assessment & Plan Note (Signed)
Avoid NSAIDs 

## 2017-01-27 NOTE — Patient Instructions (Addendum)
I'll recommend that you do not take your Adderall until your blood pressure is controlled Increase the Diovan dose to 320 mg and have you return in 2 weeks for recheck and NON-fasting labs Start the antibiotics Please do eat yogurt daily or take a probiotic daily for the next month or two We want to replace the healthy germs in the gut If you notice foul, watery diarrhea in the next two months, schedule an appointment RIGHT AWAY  [place] stands for "Dietary Approaches to Stop Hypertension." The DASH eating plan is a healthy eating plan that has been shown to reduce high blood pressure (hypertension). It may also reduce your risk for type 2 diabetes, heart disease, and stroke. The DASH eating plan may also help with weight loss. What are tips for following this plan? General guidelines   Avoid eating more than 2,300 mg (milligrams) of salt (sodium) a day. If you have hypertension, you may need to reduce your sodium intake to 1,500 mg a day.  Limit alcohol intake to no more than 1 drink a day for nonpregnant women and 2 drinks a day for men. One drink equals 12 oz of beer, 5 oz of wine, or 1 oz of hard liquor.  Work with your health care provider to maintain a healthy body weight or to lose weight. Ask what an ideal weight is for you.  Get at least 30 minutes of exercise that causes your heart to beat faster (aerobic exercise) most days of the week. Activities may include walking, swimming, or biking.  Work with your health care provider or diet and nutrition specialist (dietitian) to adjust your eating plan to your individual calorie needs. Reading food labels   Check food labels for the amount of sodium per serving. Choose foods with less than 5 percent of the Daily Value of sodium. Generally, foods with less than 300 mg of sodium per serving fit into this eating plan.  To find whole grains, look for the word "whole" as the first word in the ingredient list. Shopping   Buy  products labeled as "low-sodium" or "no salt added."  Buy fresh foods. Avoid canned foods and premade or frozen meals. Cooking   Avoid adding salt when cooking. Use salt-free seasonings or herbs instead of table salt or sea salt. Check with your health care provider or pharmacist before using salt substitutes.  Do not fry foods. Cook foods using healthy methods such as baking, boiling, grilling, and broiling instead.  Cook with heart-healthy oils, such as olive, canola, soybean, or sunflower oil. Meal planning    Eat a balanced diet that includes:  5 or more servings of fruits and vegetables each day. At each meal, try to fill half of your plate with fruits and vegetables.  Up to 6-8 servings of whole grains each day.  Less than 6 oz of lean meat, poultry, or fish each day. A 3-oz serving of meat is about the same size as a deck of cards. One egg equals 1 oz.  2 servings of low-fat dairy each day.  A serving of nuts, seeds, or beans 5 times each week.  Heart-healthy fats. Healthy fats called Omega-3 fatty acids are found in foods such as flaxseeds and coldwater fish, like sardines, salmon, and mackerel.  Limit how much you eat of the following:  Canned or prepackaged foods.  Food that is high in trans fat, such as fried foods.  Food that is high in saturated fat, such as fatty  meat.  Sweets, desserts, sugary drinks, and other foods with added sugar.  Full-fat dairy products.  Do not salt foods before eating.  Try to eat at least 2 vegetarian meals each week.  Eat more home-cooked food and less restaurant, buffet, and fast food.  When eating at a restaurant, ask that your food be prepared with less salt or no salt, if possible. What foods are recommended? The items listed may not be a complete list. Talk with your dietitian about what dietary choices are best for you. Grains  Whole-grain or whole-wheat bread. Whole-grain or whole-wheat pasta. Brown rice. [place]row. Bulgur. Whole-grain and low-sodium cereals. Pita bread. Low-fat, low-sodium crackers. Whole-wheat flour tortillas. Vegetables  Fresh or frozen vegetables (raw, steamed, roasted, or grilled). Low-sodium or reduced-sodium tomato and vegetable juice. Low-sodium or reduced-sodium tomato sauce and tomato paste. Low-sodium or reduced-sodium canned vegetables. Fruits  All fresh, dried, or frozen fruit. Canned fruit in natural juice (without added sugar). Meat and other protein foods  Skinless chicken or [place]. Ground chicken or [place]. Pork with fat trimmed off. Fish and seafood. Egg whites. Dried beans, peas, or lentils. Unsalted nuts, nut butters, and seeds. Unsalted canned beans. Lean cuts of beef with fat trimmed off. Low-sodium, lean deli meat. Dairy  Low-fat (1%) or fat-free (skim) milk. Fat-free, low-fat, or reduced-fat cheeses. Nonfat, low-sodium ricotta or cottage cheese. Low-fat or nonfat yogurt. Low-fat, low-sodium cheese. Fats and oils  Soft margarine without trans fats. Vegetable oil. Low-fat, reduced-fat, or light mayonnaise and salad dressings (reduced-sodium). Canola, safflower, olive, soybean, and sunflower oils. Avocado. Seasoning and other foods  Herbs. Spices. Seasoning mixes without salt. Unsalted popcorn and pretzels. Fat-free sweets. What foods are not recommended? The items listed may not be a complete list. Talk with your dietitian about what dietary choices are best for you. Grains  Baked goods made with fat, such as croissants, muffins, or some breads. Dry pasta or rice meal packs. Vegetables  Creamed or fried vegetables. Vegetables in a cheese sauce. Regular canned vegetables (not low-sodium or reduced-sodium). Regular canned tomato sauce and paste (not low-sodium or reduced-sodium). Regular tomato and vegetable juice (not low-sodium or reduced-sodium). Angie Fava. Olives. Fruits  Canned fruit in a light or heavy syrup. Fried fruit. Fruit in cream or butter sauce. Meat  and other protein foods  Fatty cuts of meat. Ribs. Fried meat. Berniece Salines. Sausage. Bologna and other processed lunch meats. Salami. Fatback. Hotdogs. Bratwurst. Salted nuts and seeds. Canned beans with added salt. Canned or smoked fish. Whole eggs or egg yolks. Chicken or [place] with skin. Dairy  Whole or 2% milk, cream, and half-and-half. Whole or full-fat cream cheese. Whole-fat or sweetened yogurt. Full-fat cheese. Nondairy creamers. Whipped toppings. Processed cheese and cheese spreads. Fats and oils  Butter. Stick margarine. Lard. Shortening. Ghee. Bacon fat. Tropical oils, such as coconut, palm kernel, or palm oil. Seasoning and other foods  Salted popcorn and pretzels. Onion salt, garlic salt, seasoned salt, table salt, and sea salt. Worcestershire sauce. Tartar sauce. Barbecue sauce. Teriyaki sauce. Soy sauce, including reduced-sodium. Steak sauce. Canned and packaged gravies. Fish sauce. Oyster sauce. Cocktail sauce. Horseradish that you find on the shelf. Ketchup. Mustard. Meat flavorings and tenderizers. Bouillon cubes. Hot sauce and Tabasco sauce. Premade or packaged marinades. Premade or packaged taco seasonings. Relishes. Regular salad dressings. Where to find more information:  National Heart, Lung, and [place]: https://wilson-eaton.com/  American Heart Association: www.heart.org Summary  The DASH eating plan is a healthy eating plan that has been shown  to reduce high blood pressure (hypertension). It may also reduce your risk for type 2 diabetes, heart disease, and stroke.  With the DASH eating plan, you should limit salt (sodium) intake to 2,300 mg a day. If you have hypertension, you may need to reduce your sodium intake to 1,500 mg a day.  When on the DASH eating plan, aim to eat more fresh fruits and vegetables, whole grains, lean proteins, low-fat dairy, and heart-healthy fats.  Work with your health care provider or diet and nutrition specialist (dietitian) to adjust your  eating plan to your individual calorie needs. This information is not intended to replace advice given to you by your health care provider. Make sure you discuss any questions you have with your health care provider. Document Released: 10/07/2011 Document Revised: 10/11/2016 Document Reviewed: 10/11/2016 Elsevier Interactive Patient Education  2017 [employer].

## 2017-01-27 NOTE — Assessment & Plan Note (Signed)
Check vit D level. 
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
## 2017-01-27 NOTE — Assessment & Plan Note (Signed)
Check Mg2+ and K+ 

## 2017-01-27 NOTE — Assessment & Plan Note (Signed)
Check A1c; avoid whites

## 2017-01-27 NOTE — Assessment & Plan Note (Signed)
Check K+ and Mg2+ 

## 2017-01-28 LAB — HEMOGLOBIN A1C
Hgb A1c MFr Bld: 5.8 % — ABNORMAL HIGH
Mean Plasma Glucose: 120 mg/dL

## 2017-01-28 LAB — VITAMIN D 25 HYDROXY (VIT D DEFICIENCY, FRACTURES): Vit D, 25-Hydroxy: 32 ng/mL (ref 30–100)

## 2017-02-11 ENCOUNTER — Ambulatory Visit (INDEPENDENT_AMBULATORY_CARE_PROVIDER_SITE_OTHER): Payer: BLUE CROSS/BLUE SHIELD | Admitting: Family Medicine

## 2017-02-11 ENCOUNTER — Encounter: Payer: Self-pay | Admitting: Family Medicine

## 2017-02-11 VITALS — BP 124/82 | HR 91 | Temp 98.0°F | Resp 14 | Wt 201.3 lb

## 2017-02-11 DIAGNOSIS — F329 Major depressive disorder, single episode, unspecified: Secondary | ICD-10-CM | POA: Diagnosis not present

## 2017-02-11 DIAGNOSIS — R140 Abdominal distension (gaseous): Secondary | ICD-10-CM

## 2017-02-11 DIAGNOSIS — I10 Essential (primary) hypertension: Secondary | ICD-10-CM

## 2017-02-11 DIAGNOSIS — F419 Anxiety disorder, unspecified: Secondary | ICD-10-CM

## 2017-02-11 DIAGNOSIS — B3731 Acute candidiasis of vulva and vagina: Secondary | ICD-10-CM

## 2017-02-11 DIAGNOSIS — B373 Candidiasis of vulva and vagina: Secondary | ICD-10-CM

## 2017-02-11 DIAGNOSIS — F32A Depression, unspecified: Secondary | ICD-10-CM

## 2017-02-11 MED ORDER — POLYETHYLENE GLYCOL 3350 17 GM/SCOOP PO POWD: 17.0000 g | Freq: Every day | ORAL | 1 refills | Status: DC

## 2017-02-11 MED ORDER — SULFAMETHOXAZOLE-TRIMETHOPRIM 800-160 MG PO TABS: 1.0000 | ORAL_TABLET | Freq: Two times a day (BID) | ORAL | 0 refills | Status: AC

## 2017-02-11 MED ORDER — FLUCONAZOLE 150 MG PO TABS: 150.0000 mg | ORAL_TABLET | Freq: Once | ORAL | 0 refills | Status: AC

## 2017-02-11 NOTE — Patient Instructions (Addendum)
Check out services at [facility] Phone [phone] Address: [address], [place], [place] [address]   Start the miralax Let's get the pelvic ultrasound Try to get regular walking   12 Ways to Curb Anxiety  ?Anxiety is normal human sensation. It is what helped our ancestors survive the pitfalls of the wilderness. Anxiety is defined as experiencing worry or nervousness about an imminent event or something with an uncertain outcome. It is a feeling experienced by most people at some point in their lives. Anxiety can be triggered by a very personal issue, such as the illness of a loved one, or an event of global proportions, such as a refugee crisis. Some of the symptoms of anxiety are:  Feeling restless.  Having a feeling of impending danger.  Increased heart rate.  Rapid breathing. Sweating.  Shaking.  Weakness or feeling tired.  Difficulty concentrating on anything except the current worry.  Insomnia.  Stomach or bowel problems. What can we do about anxiety we may be feeling? There are many techniques to help manage stress and relax. Here are 12 ways you can reduce your anxiety almost immediately: 1. Turn off the constant feed of information. Take a social media sabbatical. Studies have shown that social media directly contributes to social anxiety.  2. Monitor your television viewing habits. Are you watching shows that are also contributing to your anxiety, such as 24-hour news stations? Try watching something else, or better yet, nothing at all. Instead, listen to music, read an inspirational book or practice a hobby. 3. Eat nutritious meals. Also, don't skip meals and keep healthful snacks on hand. Hunger and poor diet contributes to feeling anxious. 4. Sleep. Sleeping on a regular schedule for at least seven to eight hours a night will do wonders for your outlook when you are awake. 5. Exercise. Regular exercise will help rid your body of that anxious energy and help you get more restful  sleep. 6. Try deep (diaphragmatic) breathing. Inhale slowly through your nose for five seconds and exhale through your mouth. 7. Practice acceptance and gratitude. When anxiety hits, accept that there are things out of your control that shouldn't be of immediate concern.  8. Seek out humor. When anxiety strikes, watch a funny video, read jokes or call a friend who makes you laugh. Laughter is healing for our bodies and releases endorphins that are calming. 9. Stay positive. Take the effort to replace negative thoughts with positive ones. Try to see a stressful situation in a positive light. Try to come up with solutions rather than dwelling on the problem. 10. Figure out what triggers your anxiety. Keep a journal and make note of anxious moments and the events surrounding them. This will help you identify triggers you can avoid or even eliminate. 11. Talk to someone. Let a trusted friend, family member or even trained professional know that you are feeling overwhelmed and anxious. Verbalize what you are feeling and why.  12. Volunteer. If your anxiety is triggered by a crisis on a large scale, become an advocate and work to resolve the problem that is causing you unease. Anxiety is often unwelcome and can become overwhelming. If not kept in check, it can become a disorder that could require medical treatment. However, if you take the time to care for yourself and avoid the triggers that make you anxious, you will be able to find moments of relaxation and clarity that make your life much more enjoyable.
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
## 2017-02-13 NOTE — Assessment & Plan Note (Signed)
Well controlled 

## 2017-02-13 NOTE — Assessment & Plan Note (Signed)
Patient encouraged to follow-up with her psychiatrist; she was told about [facility] services, walk-in clinic for crisis; see AVS for dealing with anxiety

## 2017-02-13 NOTE — Assessment & Plan Note (Deleted)
Well-contorlled

## 2017-02-16 ENCOUNTER — Ambulatory Visit: Payer: BLUE CROSS/BLUE SHIELD

## 2017-02-23 IMAGING — CR DG SHOULDER 2+V*R*
1 series · 3 of 3 positions shown · non-contrast
Comparison: 09/02/2016

CLINICAL DATA: Chronic pain.  Fibromyalgia.

EXAM:
RIGHT SHOULDER - 2+ VIEW

[Series 1: dg shoulder right · 0.14mm/px · 3 of 3 slices shown]
[im 1/3]
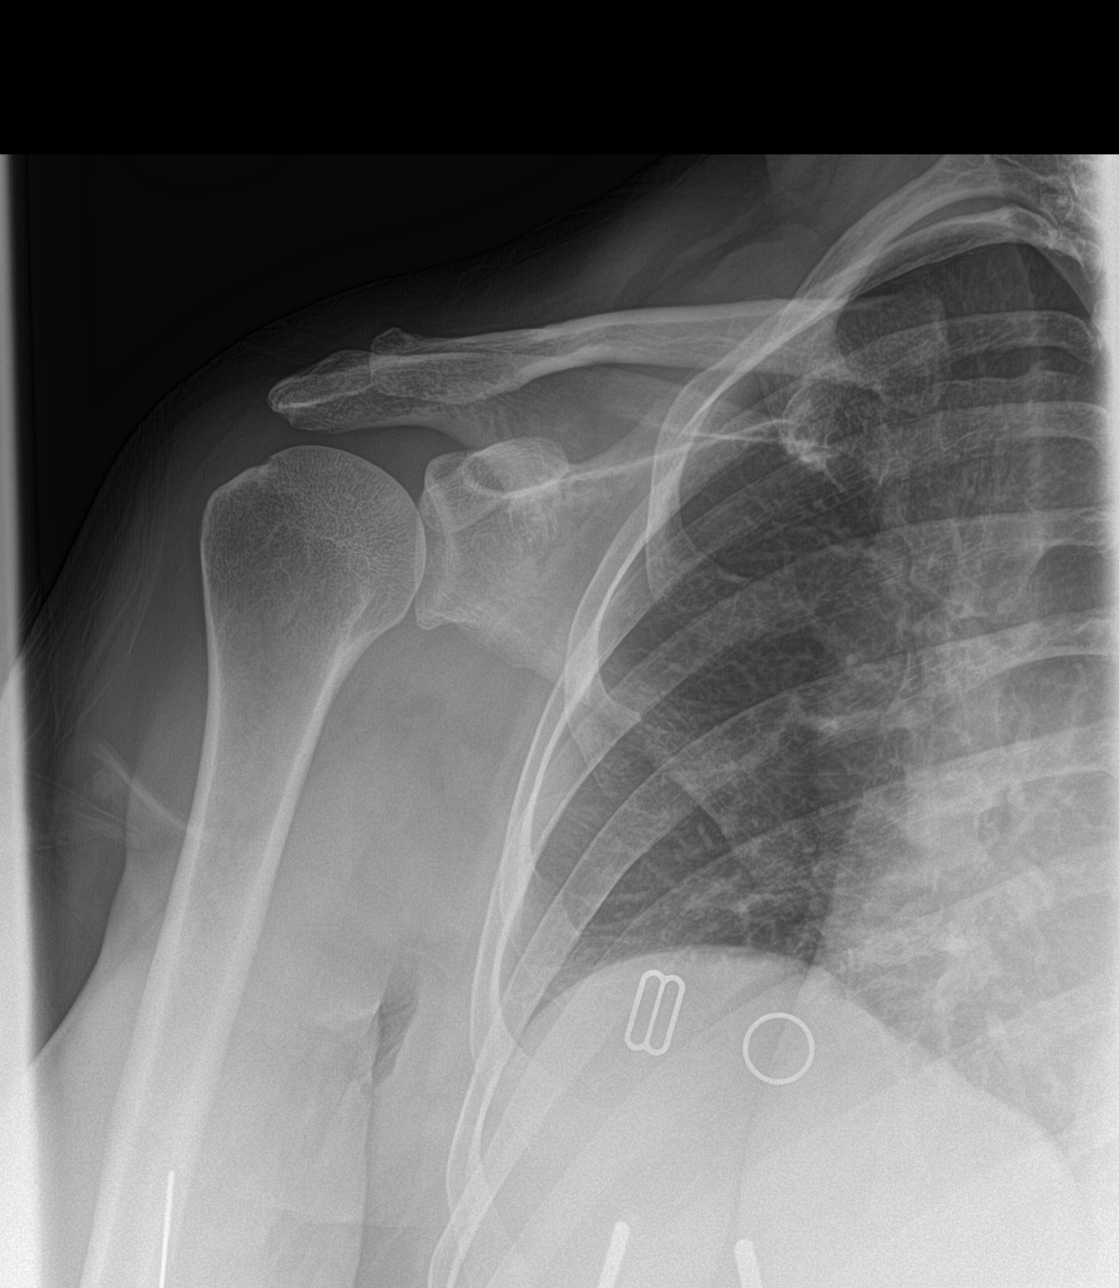
[im 2/3]
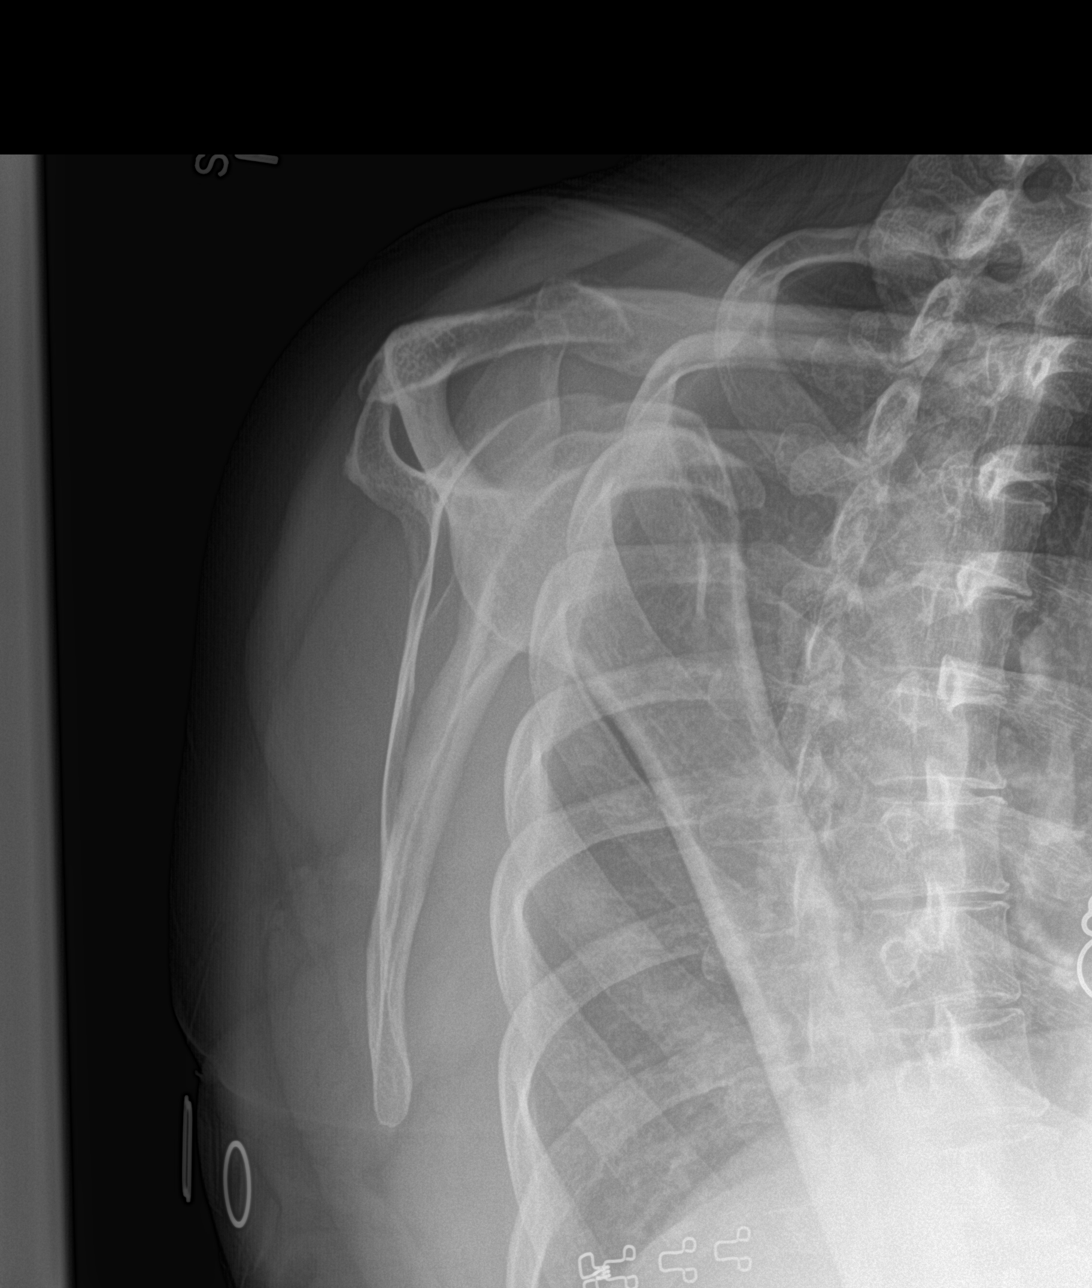
[im 3/3]
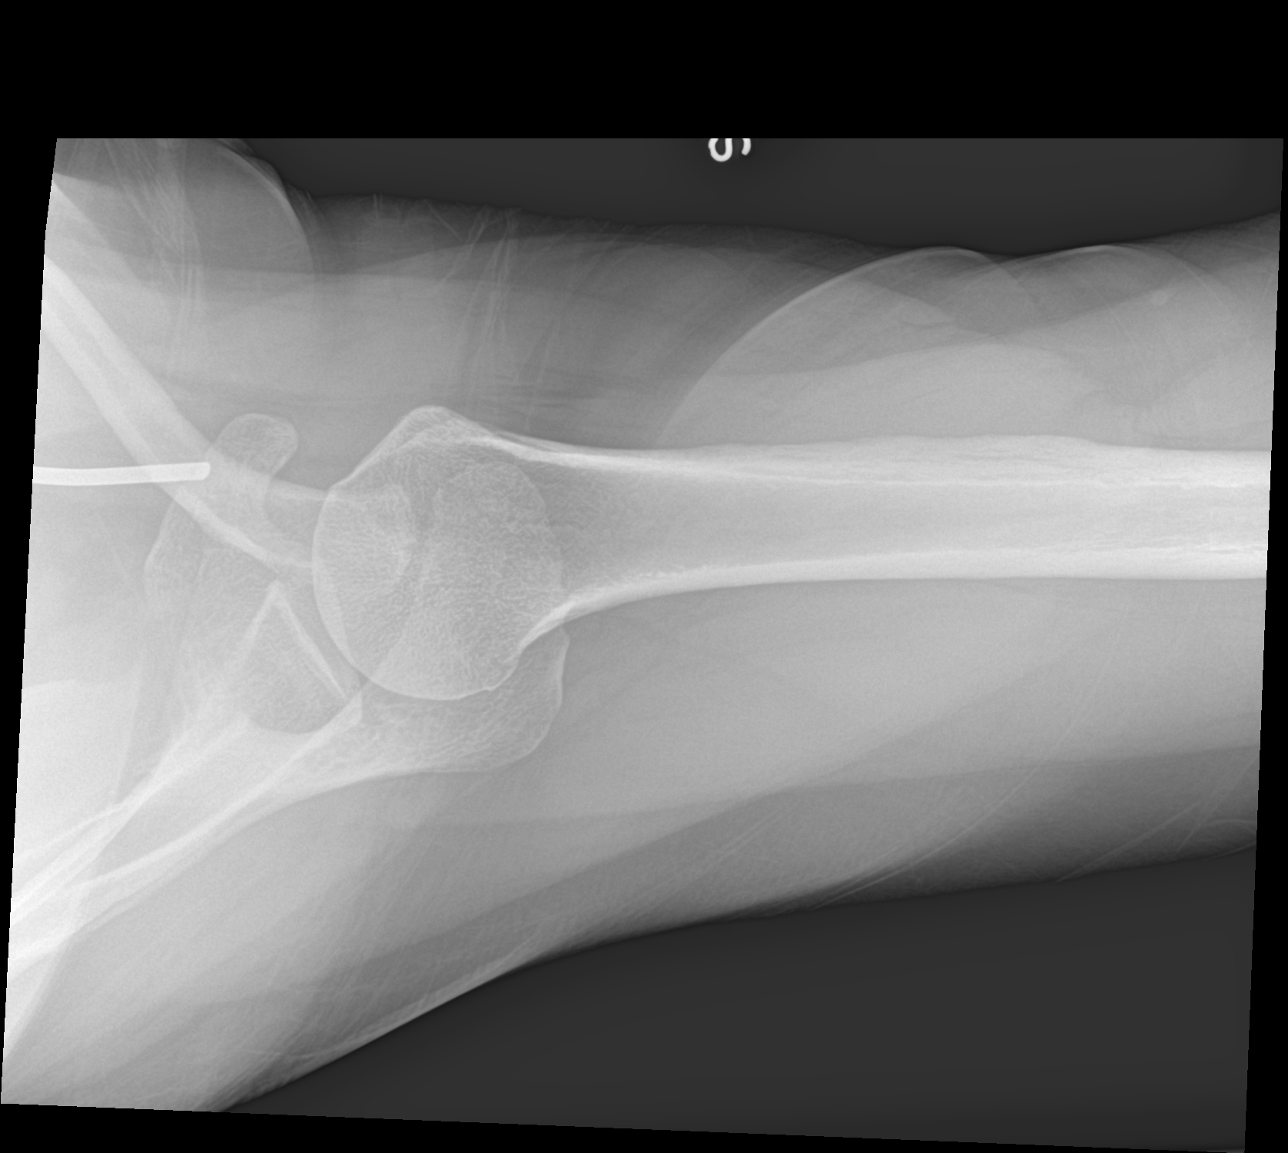

[3 of 3 positions shown; findings below may reference images not displayed]

FINDINGS: No fracture.  No bone lesion.

The glenohumeral and AC joints are normally spaced and aligned.
There are no significant arthropathic changes.

Soft tissues are unremarkable.
IMPRESSION: Negative.

## 2017-02-23 IMAGING — CR DG KNEE 1-2V*R*
1 series · 2 of 2 positions shown · non-contrast
Comparison: 09/11/2015

CLINICAL DATA: Chronic right knee pain.  Fibromyalgia.

EXAM:
RIGHT KNEE - 1-2 VIEW

[Series 1: dg knee 1-2 views right · 0.14mm/px · 2 of 2 slices shown]
[im 1/2]
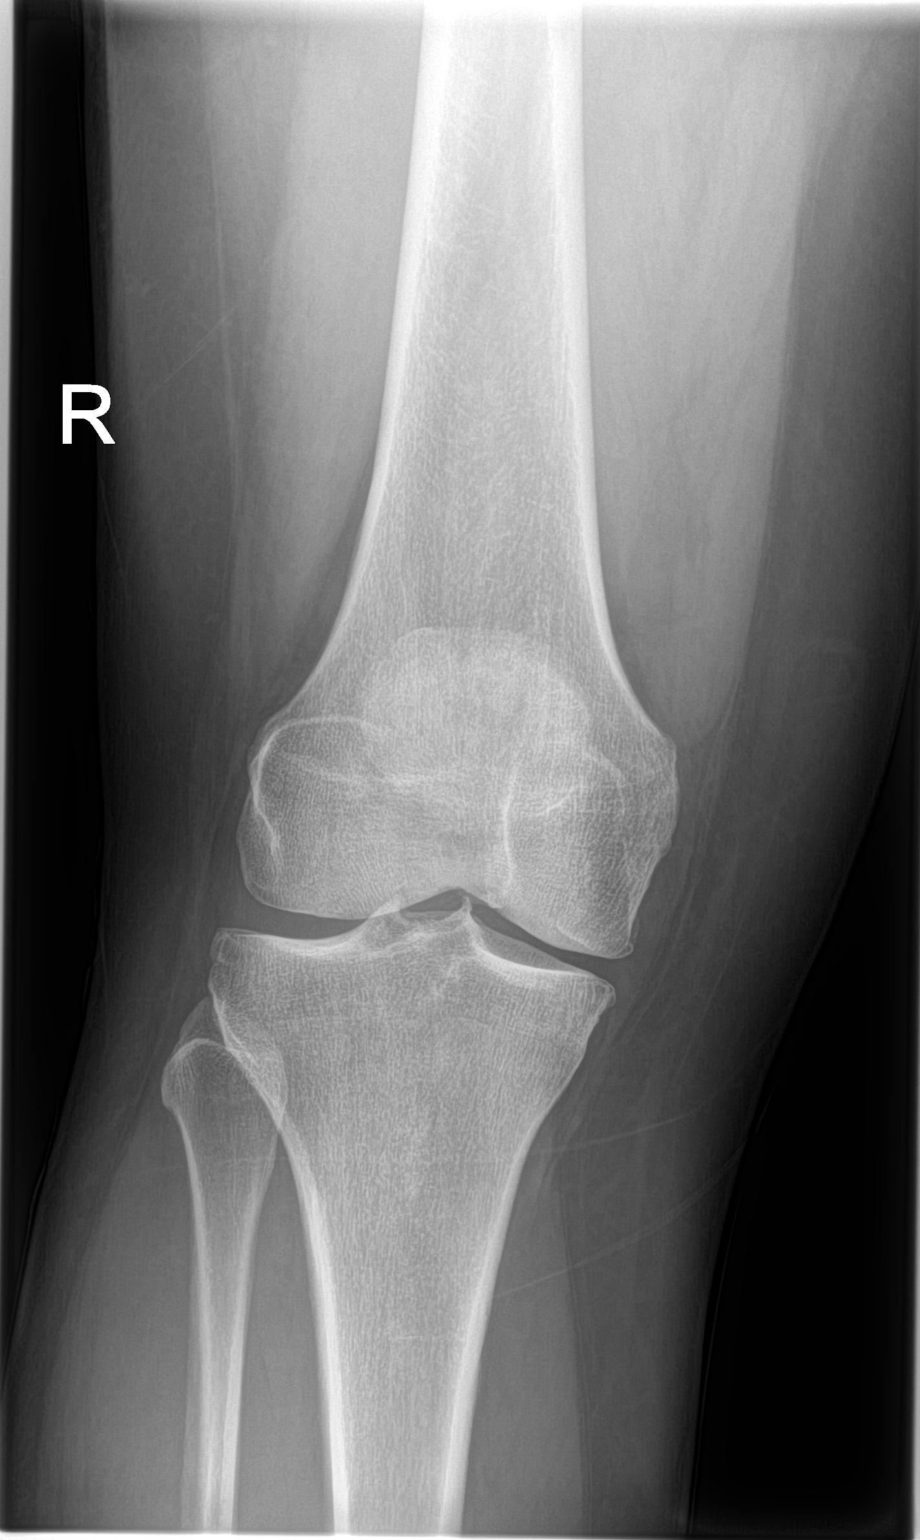
[im 2/2]
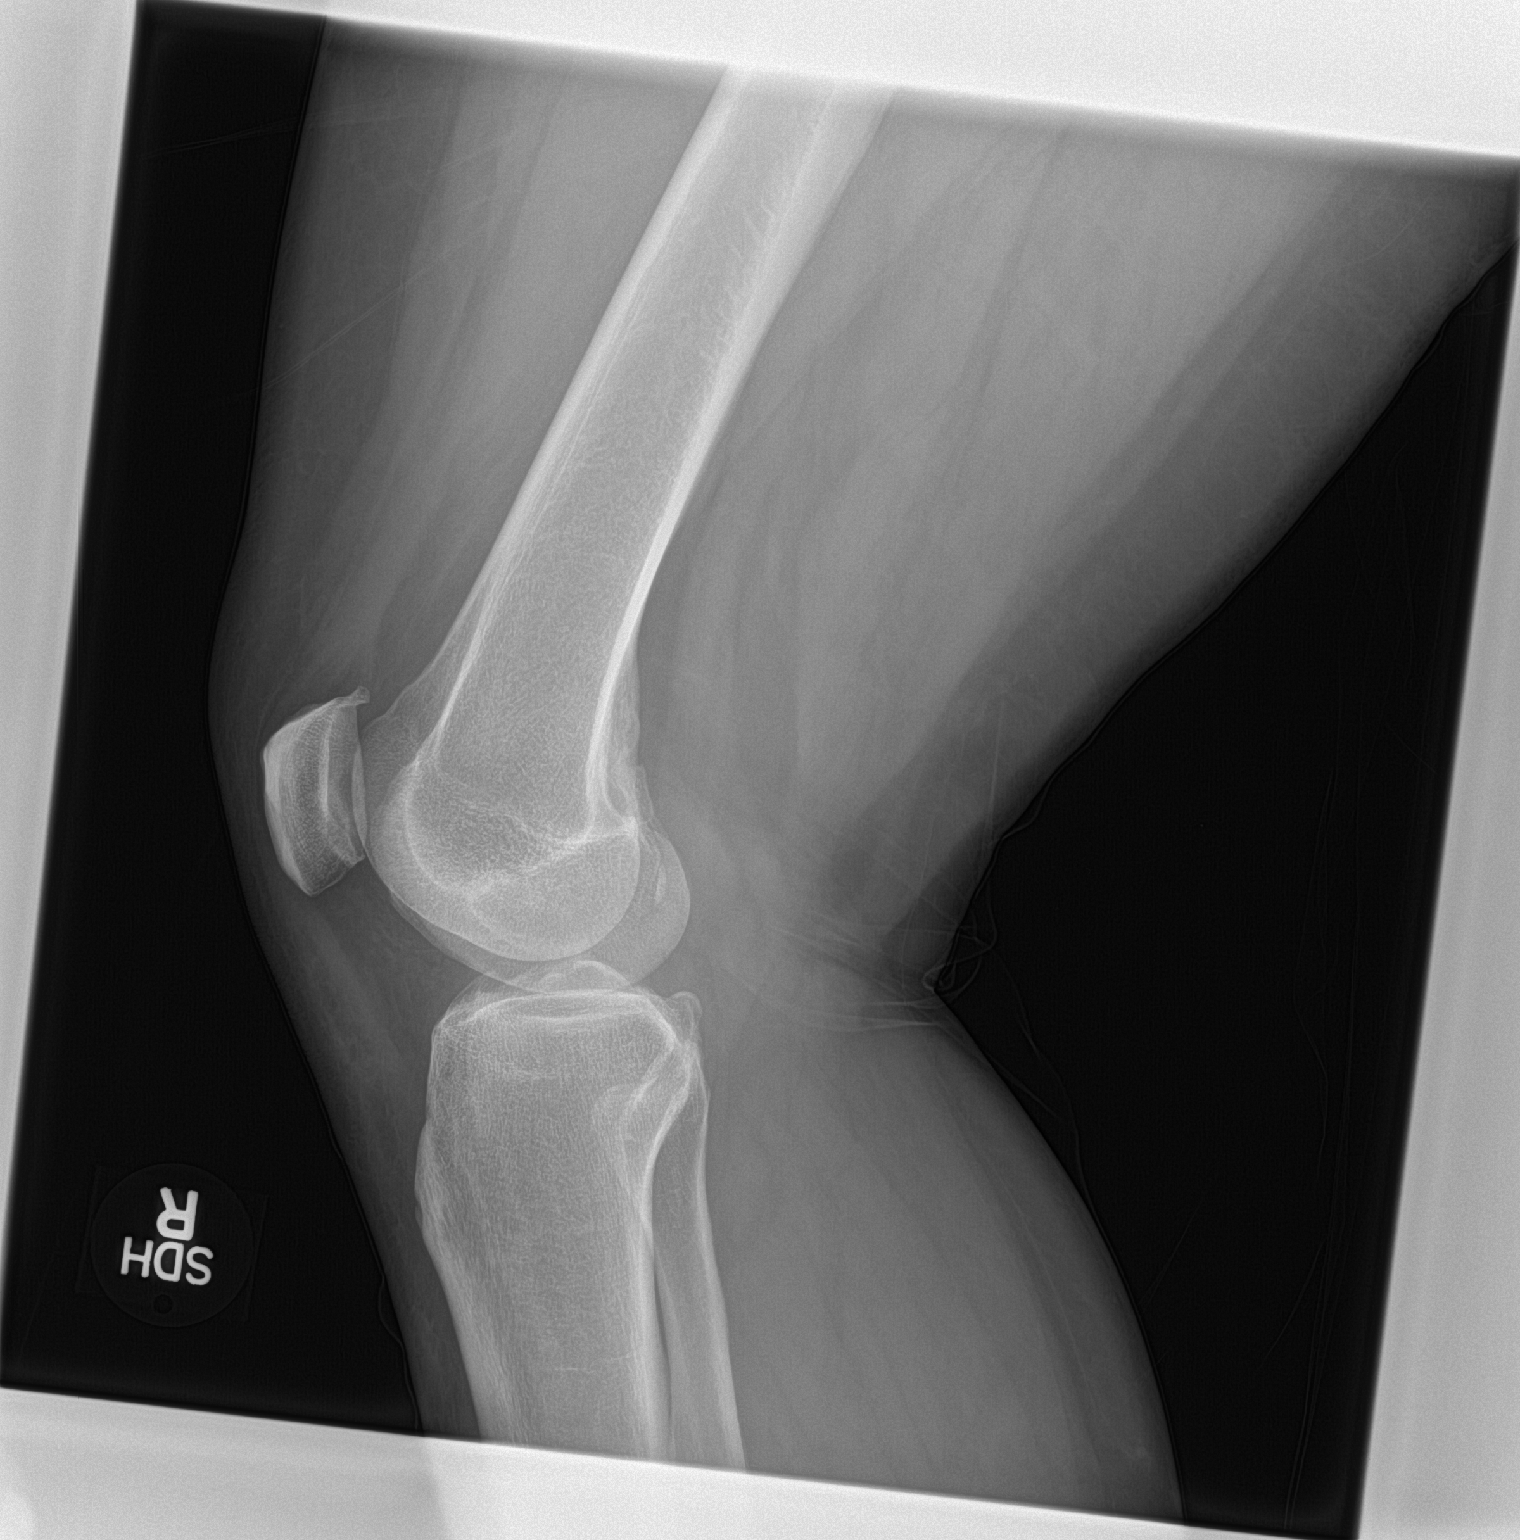

[2 of 2 positions shown; findings below may reference images not displayed]

FINDINGS: No fracture.  No bone lesion.

Mild narrowing of the medial joint space compartment. Small spurs
noted from the medial compartment and superior patella.

No other degenerative/ arthropathic change.  No joint effusion.

Soft tissues are unremarkable.
IMPRESSION: 1. No fracture, bone lesion or acute finding.
2. Mild osteoarthritis.

## 2017-02-23 IMAGING — CR DG HAND COMPLETE 3+V*L*
1 series · 3 of 3 positions shown · non-contrast
Comparison: None.

CLINICAL DATA: Chronic left hand pain.  Fibromyalgia.

EXAM:
LEFT HAND - COMPLETE 3+ VIEW

[Series 1: dg hand complete left · 0.14mm/px · 3 of 3 slices shown]
[im 1/3]
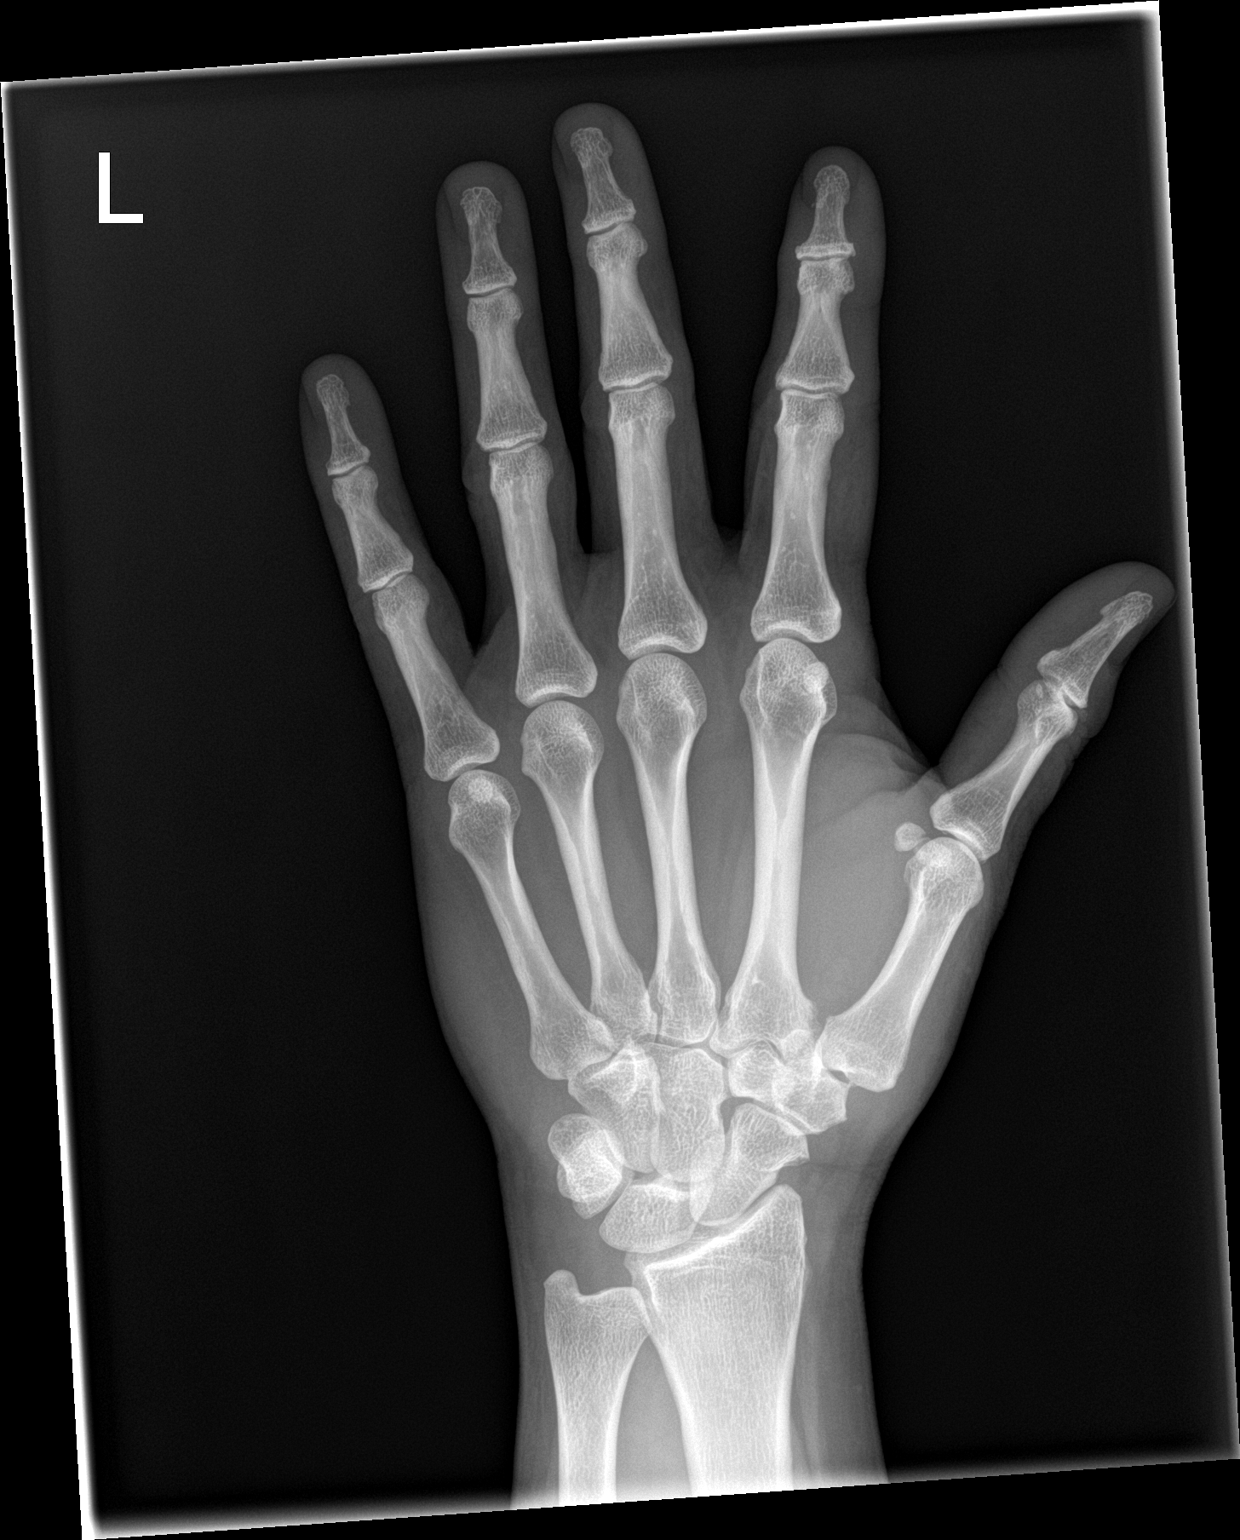
[im 2/3]
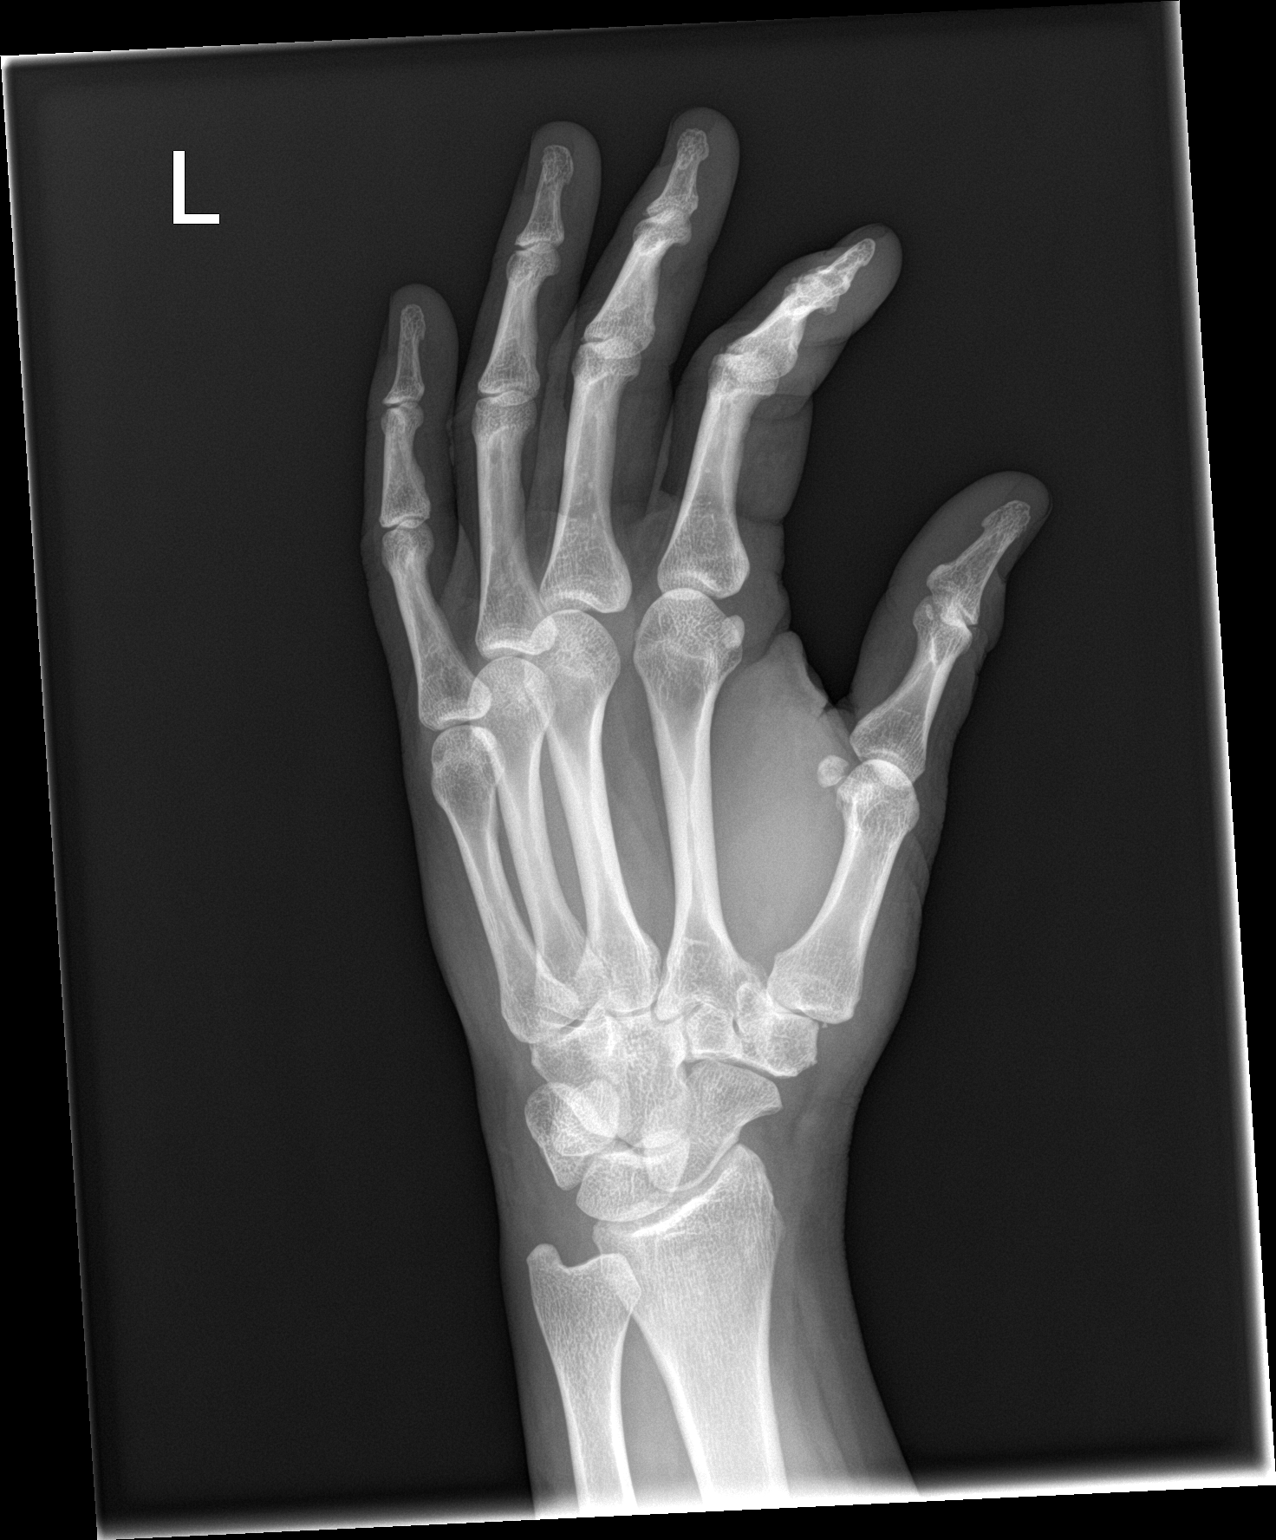
[im 3/3]
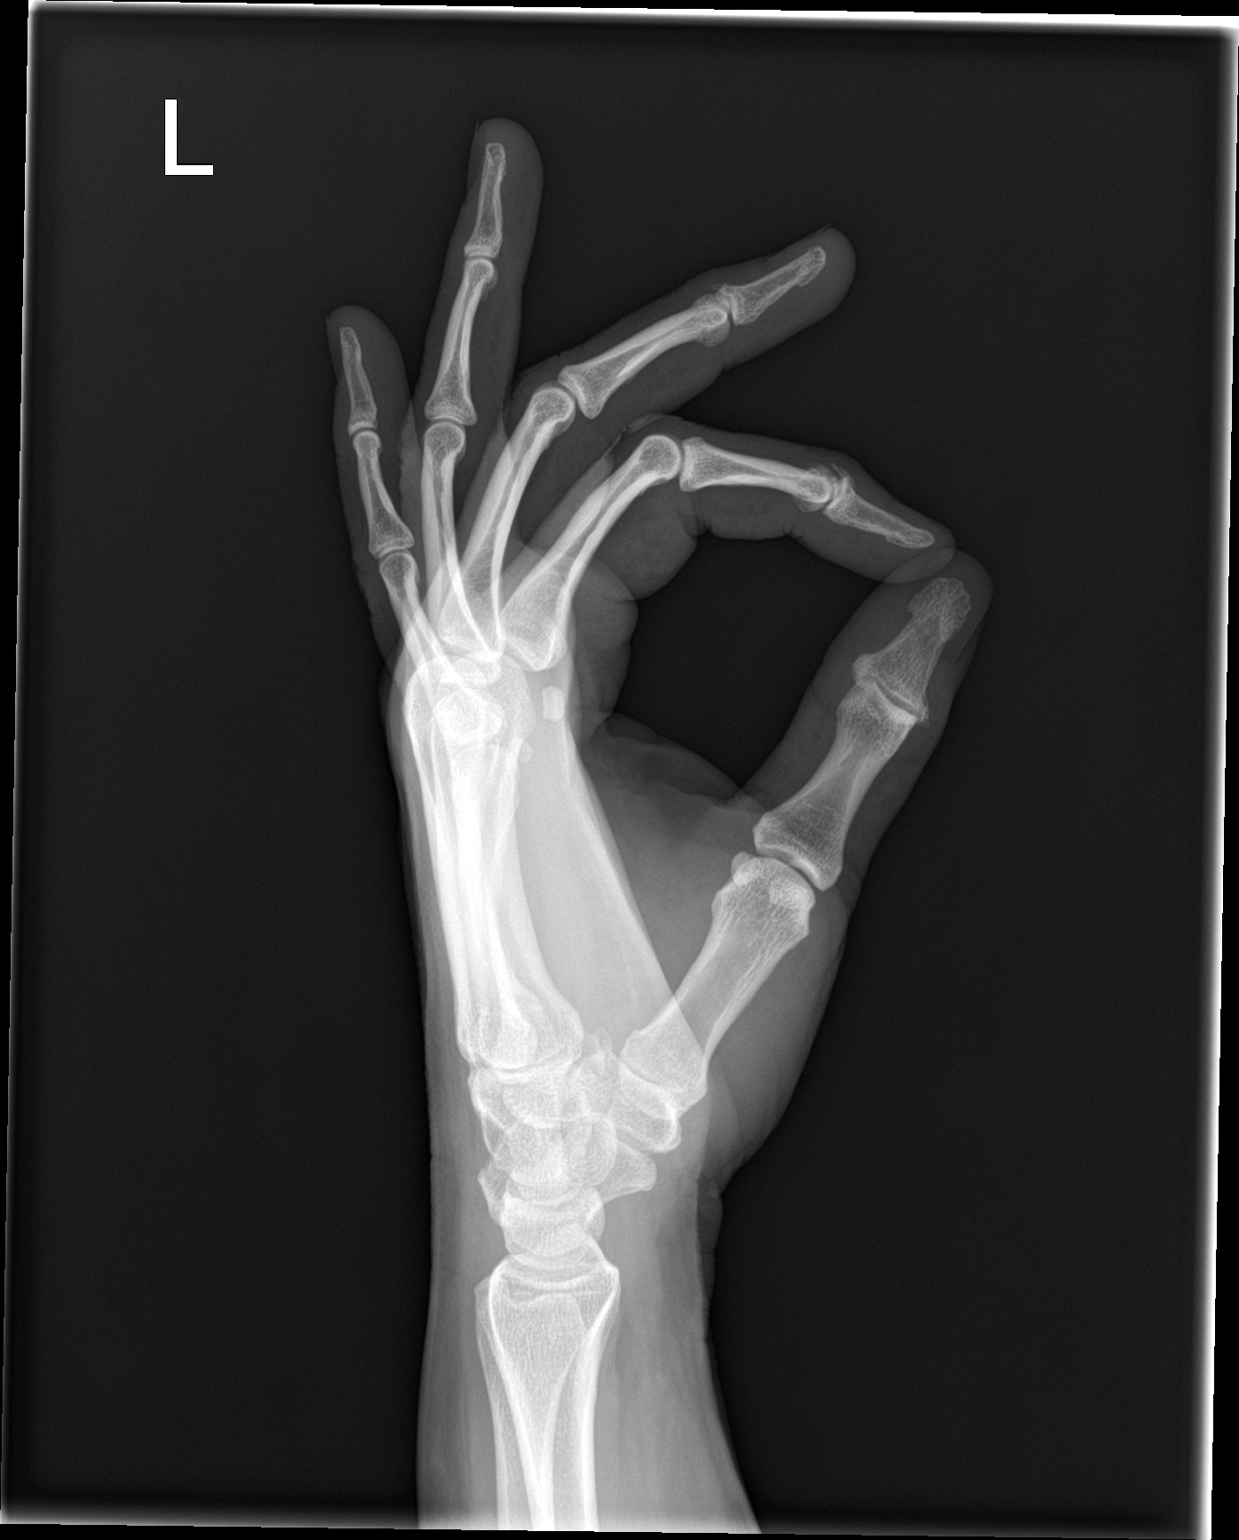

[3 of 3 positions shown; findings below may reference images not displayed]

FINDINGS: No fracture or bone lesion.

There is minor asymmetric joint space narrowing at the IP joint of
the thumb and DIP joints of the index and middle fingers with
associated small marginal osteophytes. There is minor joint space
narrowing at the trapezium first metacarpal articulation. These
findings are consistent with osteoarthritis. No periarticular
erosions.

Normal soft tissues.
IMPRESSION: 1. No fracture, bone lesion or acute abnormality.
2. Mild osteoarthritis involving the IP joint of the thumb, and DIP
joints of the index and middle fingers and first carpal metacarpal
articulation.

## 2017-02-24 ENCOUNTER — Ambulatory Visit
Admission: RE | Admit: 2017-02-24 | Discharge: 2017-02-24 | Disposition: A | Discharge status: Home / Self Care | Payer: BLUE CROSS/BLUE SHIELD | Source: Ambulatory Visit | Attending: Family Medicine | Admitting: Family Medicine

## 2017-02-24 DIAGNOSIS — N838 Other noninflammatory disorders of ovary, fallopian tube and broad ligament: Secondary | ICD-10-CM | POA: Insufficient documentation

## 2017-02-24 DIAGNOSIS — Z90721 Acquired absence of ovaries, unilateral: Secondary | ICD-10-CM | POA: Insufficient documentation

## 2017-02-24 DIAGNOSIS — R140 Abdominal distension (gaseous): Secondary | ICD-10-CM | POA: Insufficient documentation

## 2017-02-24 DIAGNOSIS — Z90710 Acquired absence of both cervix and uterus: Secondary | ICD-10-CM | POA: Insufficient documentation

## 2017-02-28 ENCOUNTER — Telehealth: Payer: Self-pay | Admitting: Family Medicine

## 2017-02-28 ENCOUNTER — Other Ambulatory Visit: Payer: Self-pay | Admitting: Family Medicine

## 2017-02-28 DIAGNOSIS — N838 Other noninflammatory disorders of ovary, fallopian tube and broad ligament: Secondary | ICD-10-CM

## 2017-02-28 MED ORDER — LEVOTHYROXINE SODIUM 125 MCG PO TABS: 125.0000 ug | ORAL_TABLET | Freq: Every day | ORAL | 3 refills | Status: DC

## 2017-02-28 NOTE — Telephone Encounter (Signed)
I talked with patient; refer back to Dr. Keturah Barre; disposition up to him If no word about appt in 1 week, call us; she agrees

## 2017-02-28 NOTE — Telephone Encounter (Signed)
Pt had a ultrasound done last week and would like to know if results are available? Please call her back.

## 2017-02-28 NOTE — Telephone Encounter (Signed)
Spoke with [place]; Pharm Tech from [facility]. She states pt  has a month left of her Valsartan.

## 2017-02-28 NOTE — Telephone Encounter (Signed)
Pt taking 320 mg Valsartan Daily. Pt stated the 160 mg was a old refill. Pt also need a refill on her thyroid medicine.

## 2017-02-28 NOTE — Telephone Encounter (Signed)
Please clarify with patient which strength of valsartan she takes We have 320 mg daily listed Pharmacy requesting 160 mg Thank you I'll contact her this afternoon about her scan results

## 2017-02-28 NOTE — Telephone Encounter (Signed)
She shouldn't be out of the 320 mg valsartan yet I'll send thyroid med

## 2017-03-01 ENCOUNTER — Other Ambulatory Visit: Payer: Self-pay | Admitting: Family Medicine

## 2017-03-01 NOTE — Telephone Encounter (Signed)
One year approved yesterday

## 2017-03-04 ENCOUNTER — Telehealth: Payer: Self-pay | Admitting: Obstetrics and Gynecology

## 2017-03-04 NOTE — Telephone Encounter (Signed)
Ref from Dr Sanda Klein for ovarian cyst - scheduled patient for the first opening I see - 03/17/2017 - patietn is having pain and wants to know if she can possibly be seen any sooner  Please call

## 2017-03-07 NOTE — Telephone Encounter (Signed)
R/s pt appt for 03/08/17 at 8:45. Pt very appreciative.

## 2017-03-08 ENCOUNTER — Encounter: Payer: Self-pay | Admitting: Obstetrics and Gynecology

## 2017-03-08 ENCOUNTER — Telehealth: Payer: Self-pay | Admitting: Obstetrics and Gynecology

## 2017-03-08 ENCOUNTER — Ambulatory Visit (INDEPENDENT_AMBULATORY_CARE_PROVIDER_SITE_OTHER): Payer: BLUE CROSS/BLUE SHIELD | Admitting: Obstetrics and Gynecology

## 2017-03-08 VITALS — BP 130/84 | HR 88 | Ht 65.0 in | Wt 203.7 lb

## 2017-03-08 DIAGNOSIS — Z90710 Acquired absence of both cervix and uterus: Secondary | ICD-10-CM | POA: Insufficient documentation

## 2017-03-08 DIAGNOSIS — N83299 Other ovarian cyst, unspecified side: Secondary | ICD-10-CM

## 2017-03-08 DIAGNOSIS — R1032 Left lower quadrant pain: Secondary | ICD-10-CM

## 2017-03-08 HISTORY — DX: Other ovarian cyst, unspecified side: N83.299

## 2017-03-08 HISTORY — DX: Acquired absence of both cervix and uterus: Z90.710

## 2017-03-08 NOTE — Patient Instructions (Signed)
1. Blood work is obtained today for ovarian malignancy screening 2. Return in 10 days for follow-up
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
## 2017-03-08 NOTE — Telephone Encounter (Signed)
Pt states she is having left sided pelvic pain. Tried tylenol 500mg  3 tabs and 4 hours later took tramadol(1:00pm) with no relief. Pls advise.

## 2017-03-08 NOTE — Telephone Encounter (Signed)
Patient was seen today for an ovarian cyst and states she is now in a lot of pain. Please Advise.

## 2017-03-09 LAB — OVARIAN MALIGNANCY RISK-ROMA
CA 125: 16.5 U/mL (ref 0.0–38.1)
HE4 (HUMAN EPID PROT 4): 51.7 pmol/L (ref 0.0–105.2)
POSTMENOPAUSAL ROMA: 1.26
PREMENOPAUSAL ROMA: 0.81

## 2017-03-09 LAB — POSTMENOPAUSAL INTERP: LOW

## 2017-03-09 LAB — PREMENOPAUSAL INTERP: LOW

## 2017-03-09 NOTE — Telephone Encounter (Signed)
Pt aware  Per mad to take 800mg  ibup tid and ES tylenol 2 qid. Pt voices understanding.

## 2017-03-17 ENCOUNTER — Encounter: Payer: BLUE CROSS/BLUE SHIELD | Admitting: Obstetrics and Gynecology

## 2017-03-21 ENCOUNTER — Other Ambulatory Visit: Payer: Self-pay | Admitting: Family Medicine

## 2017-03-22 ENCOUNTER — Encounter: Payer: Self-pay | Admitting: Obstetrics and Gynecology

## 2017-03-22 ENCOUNTER — Ambulatory Visit (INDEPENDENT_AMBULATORY_CARE_PROVIDER_SITE_OTHER): Payer: BLUE CROSS/BLUE SHIELD | Admitting: Obstetrics and Gynecology

## 2017-03-22 VITALS — BP 137/90 | HR 86 | Ht 65.0 in | Wt 201.8 lb

## 2017-03-22 DIAGNOSIS — Z90710 Acquired absence of both cervix and uterus: Secondary | ICD-10-CM

## 2017-03-22 DIAGNOSIS — N83299 Other ovarian cyst, unspecified side: Secondary | ICD-10-CM | POA: Diagnosis not present

## 2017-03-22 DIAGNOSIS — R1032 Left lower quadrant pain: Secondary | ICD-10-CM | POA: Diagnosis not present

## 2017-03-22 NOTE — Progress Notes (Signed)
Chief complaint: 1. Chronic left lower quadrant pain 2. Complex left ovarian cyst  Patient presents for follow-up on laboratory testing for evaluation of complex left ovarian cyst. ROMA score is reassuring for postmenopausal patient. Patient is continuing to experience left lower quadrant discomfort, and desires surgical management. Multiple questions regarding libido and sexual performance were addressed.  OBJECTIVE: BP 137/90   Pulse 86   Ht 5\' 5"  (1.651 m)   Wt 201 lb 12.8 oz (91.5 kg)   BMI 33.58 kg/m  Physical exam-deferred  Postmenopausal ROMA-1.26 (normal)  Ultrasound:Left ovary Measurements: 3.6 x 1.5 x 2.4 cm. 3.4 x 1.1 x 3.7 cm hypoechoic left ovarian lesion. Although this could represent a complex cyst a solid lesion cannot be excluded. Left ovarian cancer cannot be excluded. Gynecologic evaluation suggested .  ASSESSMENT: 1. Symptomatic complex left ovarian cyst 2. ROMA score low potential for malignancy 3. Patient desires surgical intervention  PLAN: 1. Laparoscopic left oophorectomy scheduled 2. Return for preoperative appointment before surgery 3. Multiple questions regarding sexual performance and ERT were addressed  A total of 15 minutes were spent face-to-face with the patient during this encounter and over half of that time dealt with counseling and coordination of care.  Brayton Mars, MD  Note: This dictation was prepared with Dragon dictation along with smaller phrase technology. Any transcriptional errors that result from this process are unintentional.

## 2017-03-22 NOTE — Patient Instructions (Signed)
1. Laparoscopic left oophorectomy will be scheduled 2. Return for preop appointment the week before surgery

## 2017-03-26 ENCOUNTER — Other Ambulatory Visit: Payer: Self-pay | Admitting: Family Medicine

## 2017-03-26 NOTE — Telephone Encounter (Signed)
rx request for tizanidine; I just approved a prescription a few days ago; denied this one

## 2017-04-05 ENCOUNTER — Encounter: Payer: Self-pay | Admitting: Obstetrics and Gynecology

## 2017-04-05 ENCOUNTER — Other Ambulatory Visit: Payer: BLUE CROSS/BLUE SHIELD

## 2017-04-05 ENCOUNTER — Ambulatory Visit (INDEPENDENT_AMBULATORY_CARE_PROVIDER_SITE_OTHER): Payer: BLUE CROSS/BLUE SHIELD | Admitting: Obstetrics and Gynecology

## 2017-04-05 VITALS — BP 153/84 | HR 88 | Ht 65.0 in | Wt 197.9 lb

## 2017-04-05 DIAGNOSIS — Z90710 Acquired absence of both cervix and uterus: Secondary | ICD-10-CM

## 2017-04-05 DIAGNOSIS — R1032 Left lower quadrant pain: Secondary | ICD-10-CM

## 2017-04-05 DIAGNOSIS — N83299 Other ovarian cyst, unspecified side: Secondary | ICD-10-CM

## 2017-04-05 DIAGNOSIS — Z01818 Encounter for other preprocedural examination: Secondary | ICD-10-CM
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
## 2017-04-05 NOTE — Patient Instructions (Signed)
1. Return for postop evaluation 1 week after surgery

## 2017-04-07 ENCOUNTER — Encounter
Admission: RE | Admit: 2017-04-07 | Discharge: 2017-04-07 | Disposition: A | Discharge status: Home / Self Care | Payer: BLUE CROSS/BLUE SHIELD | Source: Ambulatory Visit | Attending: Obstetrics and Gynecology | Admitting: Obstetrics and Gynecology

## 2017-04-07 DIAGNOSIS — Z136 Encounter for screening for cardiovascular disorders: Secondary | ICD-10-CM

## 2017-04-07 DIAGNOSIS — Z01810 Encounter for preprocedural cardiovascular examination: Secondary | ICD-10-CM | POA: Insufficient documentation

## 2017-04-07 DIAGNOSIS — R609 Edema, unspecified: Secondary | ICD-10-CM | POA: Insufficient documentation

## 2017-04-07 DIAGNOSIS — Z01812 Encounter for preprocedural laboratory examination: Secondary | ICD-10-CM | POA: Insufficient documentation

## 2017-04-07 HISTORY — DX: Cardiac murmur, unspecified: R01.1

## 2017-04-07 HISTORY — DX: Calculus of kidney: N20.0

## 2017-04-07 HISTORY — DX: Prediabetes: R73.03

## 2017-04-07 HISTORY — DX: Chronic obstructive pulmonary disease, unspecified: J44.9

## 2017-04-07 HISTORY — DX: Unspecified asthma, uncomplicated: J45.909

## 2017-04-07 HISTORY — DX: Irritable bowel syndrome without diarrhea: K58.9

## 2017-04-07 LAB — CBC WITH DIFFERENTIAL/PLATELET
Basophils Absolute: 0 10*3/uL (ref 0–0.1)
Basophils Relative: 1 %
EOS PCT: 6 %
Eosinophils Absolute: 0.3 10*3/uL (ref 0–0.7)
HEMATOCRIT: 40.8 % (ref 35.0–47.0)
Hemoglobin: 13.8 g/dL (ref 12.0–16.0)
LYMPHS ABS: 1.5 10*3/uL (ref 1.0–3.6)
LYMPHS PCT: 29 %
MCH: 28.3 pg (ref 26.0–34.0)
MCHC: 33.9 g/dL (ref 32.0–36.0)
MCV: 83.5 fL (ref 80.0–100.0)
MONO ABS: 0.5 10*3/uL (ref 0.2–0.9)
Monocytes Relative: 9 %
NEUTROS ABS: 3 10*3/uL (ref 1.4–6.5)
Neutrophils Relative %: 55 %
PLATELETS: 364 10*3/uL (ref 150–440)
RBC: 4.88 MIL/uL (ref 3.80–5.20)
RDW: 14.6 % — AB (ref 11.5–14.5)
WBC: 5.3 10*3/uL (ref 3.6–11.0)

## 2017-04-07 LAB — BASIC METABOLIC PANEL
Anion gap: 8 (ref 5–15)
BUN: 17 mg/dL (ref 6–20)
CHLORIDE: 103 mmol/L (ref 101–111)
CO2: 28 mmol/L (ref 22–32)
Calcium: 9.7 mg/dL (ref 8.9–10.3)
Creatinine, Ser: 0.71 mg/dL (ref 0.44–1.00)
GFR calc Af Amer: >60 mL/min (ref >60)
GFR calc non Af Amer: >60 mL/min (ref >60)
GLUCOSE: 116 mg/dL — AB (ref 65–99)
POTASSIUM: 3.6 mmol/L (ref 3.5–5.1)
SODIUM: 139 mmol/L (ref 135–145)

## 2017-04-07 LAB — TYPE AND SCREEN
ABO/RH(D): A POS
Antibody Screen: NEGATIVE

## 2017-04-07 LAB — RAPID HIV SCREEN (HIV 1/2 AB+AG)
HIV 1/2 Antibodies: NONREACTIVE
HIV-1 P24 Antigen - HIV24: NONREACTIVE

## 2017-04-07 NOTE — Patient Instructions (Signed)
Your procedure is scheduled on: 04/11/17 Mon Report to Same Day Surgery 2nd floor medical mall [facility] Entrance-take elevator on left to 2nd floor.  Check in with surgery information desk.) To find out your arrival time please call [phone] between 1PM - 3PM on 04/08/17 Fri  Remember: Instructions that are not followed completely may result in serious medical risk, up to and including death, or upon the discretion of your surgeon and anesthesiologist your surgery may need to be rescheduled.    _x___ 1. Do not eat food or drink liquids after midnight. No gum chewing or                              hard candies.     __x__ 2. No Alcohol for 24 hours before or after surgery.   __x__3. No Smoking for 24 prior to surgery.   ____  4. Bring all medications with you on the day of surgery if instructed.    __x__ 5. Notify your doctor if there is any change in your medical condition     (cold, fever, infections).     Do not wear jewelry, make-up, hairpins, clips or nail polish.  Do not wear lotions, powders, or perfumes. You may wear deodorant.  Do not shave 48 hours prior to surgery. Men may shave face and neck.  Do not bring valuables to the hospital.    [facility] is not responsible for any belongings or valuables.               Contacts, dentures or bridgework may not be worn into surgery.  Leave your suitcase in the car. After surgery it may be brought to your room.  For patients admitted to the hospital, discharge time is determined by your                       treatment team.   Patients discharged the day of surgery will not be allowed to drive home.  You will need someone to drive you home and stay with you the night of your procedure.    Please read over the following fact sheets that you were given:   [facility] Preparing for Surgery and or MRSA Information   _x___ Take anti-hypertensive (unless it includes a diuretic), cardiac, seizure, asthma,     anti-reflux and  psychiatric medicines. These include:  1. albuterol (PROVENTIL   2.levothyroxine (SYNTHROID  3.pantoprazole (PROTONIX)  4.valsartan (DIOVAN  5.  6.  ____Fleets enema or Magnesium Citrate as directed.   _x___ Use CHG Soap or sage wipes as directed on instruction sheet   _x___ Use inhalers on the day of surgery and bring to hospital day of surgery  ____ Stop Metformin and Janumet 2 days prior to surgery.    ____ Take 1/2 of usual insulin dose the night before surgery and none on the morning     surgery.   _x___ Follow recommendations from Cardiologist, Pulmonologist or PCP regarding          stopping Aspirin, Coumadin, Pllavix ,Eliquis, Effient, or Pradaxa, and Pletal.  X____Stop Anti-inflammatories such as Advil, Aleve, Ibuprofen, Motrin, Naproxen, Naprosyn, Goodies powders or aspirin products. OK to take Tylenol and                          Celebrex.   _x___ Stop supplements until after surgery.  But may  continue Vitamin D, Vitamin B,       and multivitamin.   ____ Bring C-Pap to the hospital.

## 2017-04-08 LAB — RPR: RPR Ser Ql: NONREACTIVE

## 2017-04-11 ENCOUNTER — Ambulatory Visit: Payer: BLUE CROSS/BLUE SHIELD | Admitting: Anesthesiology

## 2017-04-11 ENCOUNTER — Encounter
Admission: RE | Disposition: A | Discharge status: Home / Self Care | Payer: Self-pay | Source: Ambulatory Visit | Attending: Obstetrics and Gynecology

## 2017-04-11 ENCOUNTER — Ambulatory Visit
Admission: RE | Admit: 2017-04-11 | Discharge: 2017-04-11 | Disposition: A | Discharge status: Home / Self Care | Payer: BLUE CROSS/BLUE SHIELD | Source: Ambulatory Visit | Attending: Obstetrics and Gynecology | Admitting: Obstetrics and Gynecology

## 2017-04-11 ENCOUNTER — Encounter: Payer: Self-pay | Admitting: *Deleted

## 2017-04-11 DIAGNOSIS — F329 Major depressive disorder, single episode, unspecified: Secondary | ICD-10-CM | POA: Diagnosis not present

## 2017-04-11 DIAGNOSIS — M47816 Spondylosis without myelopathy or radiculopathy, lumbar region: Secondary | ICD-10-CM

## 2017-04-11 DIAGNOSIS — E039 Hypothyroidism, unspecified: Secondary | ICD-10-CM | POA: Insufficient documentation

## 2017-04-11 DIAGNOSIS — Z803 Family history of malignant neoplasm of breast: Secondary | ICD-10-CM | POA: Insufficient documentation

## 2017-04-11 DIAGNOSIS — K219 Gastro-esophageal reflux disease without esophagitis: Secondary | ICD-10-CM | POA: Diagnosis not present

## 2017-04-11 DIAGNOSIS — Z91013 Allergy to seafood: Secondary | ICD-10-CM | POA: Diagnosis not present

## 2017-04-11 DIAGNOSIS — Z79899 Other long term (current) drug therapy: Secondary | ICD-10-CM | POA: Diagnosis not present

## 2017-04-11 DIAGNOSIS — Z87891 Personal history of nicotine dependence: Secondary | ICD-10-CM | POA: Insufficient documentation

## 2017-04-11 DIAGNOSIS — Z888 Allergy status to other drugs, medicaments and biological substances status: Secondary | ICD-10-CM | POA: Diagnosis not present

## 2017-04-11 DIAGNOSIS — N83299 Other ovarian cyst, unspecified side: Secondary | ICD-10-CM

## 2017-04-11 DIAGNOSIS — Z800 Family history of malignant neoplasm of digestive organs: Secondary | ICD-10-CM | POA: Insufficient documentation

## 2017-04-11 DIAGNOSIS — R1032 Left lower quadrant pain: Secondary | ICD-10-CM | POA: Diagnosis present

## 2017-04-11 DIAGNOSIS — F1120 Opioid dependence, uncomplicated: Secondary | ICD-10-CM | POA: Diagnosis not present

## 2017-04-11 DIAGNOSIS — N83292 Other ovarian cyst, left side: Secondary | ICD-10-CM | POA: Insufficient documentation

## 2017-04-11 DIAGNOSIS — N736 Female pelvic peritoneal adhesions (postinfective): Secondary | ICD-10-CM | POA: Insufficient documentation

## 2017-04-11 DIAGNOSIS — F419 Anxiety disorder, unspecified: Secondary | ICD-10-CM | POA: Insufficient documentation

## 2017-04-11 DIAGNOSIS — M797 Fibromyalgia: Secondary | ICD-10-CM | POA: Insufficient documentation

## 2017-04-11 DIAGNOSIS — Z885 Allergy status to narcotic agent status: Secondary | ICD-10-CM | POA: Diagnosis not present

## 2017-04-11 DIAGNOSIS — Z9889 Other specified postprocedural states: Secondary | ICD-10-CM

## 2017-04-11 DIAGNOSIS — I10 Essential (primary) hypertension: Secondary | ICD-10-CM | POA: Insufficient documentation

## 2017-04-11 DIAGNOSIS — Z90711 Acquired absence of uterus with remaining cervical stump: Secondary | ICD-10-CM | POA: Diagnosis not present

## 2017-04-11 DIAGNOSIS — J449 Chronic obstructive pulmonary disease, unspecified: Secondary | ICD-10-CM | POA: Insufficient documentation

## 2017-04-11 HISTORY — PX: LAPAROSCOPIC SALPINGO OOPHERECTOMY: SHX5927

## 2017-04-11 HISTORY — PX: CYSTOSCOPY: SHX5120

## 2017-04-11 LAB — ABO/RH: ABO/RH(D): A POS

## 2017-04-11 LAB — GLUCOSE, CAPILLARY: GLUCOSE-CAPILLARY: 110 mg/dL — AB (ref 65–99)

## 2017-04-11 SURGERY — SALPINGO-OOPHORECTOMY, LAPAROSCOPIC
Anesthesia: General | Laterality: Left | Wound class: Clean Contaminated

## 2017-04-11 MED ORDER — SUGAMMADEX SODIUM 200 MG/2ML IV SOLN
INTRAVENOUS | Status: AC
  Filled 2017-04-11: qty 2

## 2017-04-11 MED ORDER — LIDOCAINE HCL (PF) 2 % IJ SOLN
INTRAMUSCULAR | Status: AC
Start: 2017-04-11 — End: 2017-04-11
  Filled 2017-04-11: qty 2

## 2017-04-11 MED ORDER — ROCURONIUM BROMIDE 100 MG/10ML IV SOLN
INTRAVENOUS | Status: DC | PRN
  Administered 2017-04-11: 50 mg via INTRAVENOUS
  Administered 2017-04-11: 30 mg via INTRAVENOUS

## 2017-04-11 MED ORDER — FLUORESCEIN SODIUM 10 % IV SOLN
INTRAVENOUS | Status: DC | PRN
  Administered 2017-04-11: 50 mg via INTRAVENOUS

## 2017-04-11 MED ORDER — ROCURONIUM BROMIDE 50 MG/5ML IV SOLN
INTRAVENOUS | Status: AC
  Filled 2017-04-11: qty 1

## 2017-04-11 MED ORDER — FLUORESCEIN SODIUM 10 % IV SOLN
INTRAVENOUS | Status: AC
Start: 2017-04-11 — End: 2017-04-11
  Filled 2017-04-11: qty 5

## 2017-04-11 MED ORDER — SUGAMMADEX SODIUM 200 MG/2ML IV SOLN
INTRAVENOUS | Status: DC | PRN
  Administered 2017-04-11: 100 mg via INTRAVENOUS

## 2017-04-11 MED ORDER — PROPOFOL 10 MG/ML IV BOLUS
INTRAVENOUS | Status: DC | PRN
  Administered 2017-04-11: 150 mg via INTRAVENOUS
  Administered 2017-04-11: 50 mg via INTRAVENOUS

## 2017-04-11 MED ORDER — FENTANYL CITRATE (PF) 250 MCG/5ML IJ SOLN
INTRAMUSCULAR | Status: AC
  Filled 2017-04-11: qty 5

## 2017-04-11 MED ORDER — ACETAMINOPHEN NICU IV SYRINGE 10 MG/ML
INTRAVENOUS | Status: AC
Start: 2017-04-11 — End: 2017-04-11
  Filled 2017-04-11: qty 1

## 2017-04-11 MED ORDER — MIDAZOLAM HCL 2 MG/2ML IJ SOLN
INTRAMUSCULAR | Status: DC | PRN
  Administered 2017-04-11: 2 mg via INTRAVENOUS

## 2017-04-11 MED ORDER — FENTANYL CITRATE (PF) 100 MCG/2ML IJ SOLN
25.0000 ug | INTRAMUSCULAR | Status: DC | PRN
  Administered 2017-04-11 (×2): 25 ug via INTRAVENOUS

## 2017-04-11 MED ORDER — SCOPOLAMINE 1 MG/3DAYS TD PT72
1.0000 | MEDICATED_PATCH | TRANSDERMAL | Status: DC
  Administered 2017-04-11: 1.5 mg via TRANSDERMAL

## 2017-04-11 MED ORDER — DEXAMETHASONE SODIUM PHOSPHATE 4 MG/ML IJ SOLN
INTRAMUSCULAR | Status: DC | PRN
  Administered 2017-04-11: 5 mg via INTRAVENOUS

## 2017-04-11 MED ORDER — LIDOCAINE HCL (CARDIAC) 20 MG/ML IV SOLN
INTRAVENOUS | Status: DC | PRN
  Administered 2017-04-11: 100 mg via INTRAVENOUS

## 2017-04-11 MED ORDER — PROPOFOL 10 MG/ML IV BOLUS
INTRAVENOUS | Status: AC
  Filled 2017-04-11: qty 20

## 2017-04-11 MED ORDER — ACETAMINOPHEN 10 MG/ML IV SOLN
INTRAVENOUS | Status: DC | PRN
Start: 2017-04-11 — End: 2017-04-11
  Administered 2017-04-11: 1000 mg via INTRAVENOUS

## 2017-04-11 MED ORDER — MIDAZOLAM HCL 2 MG/2ML IJ SOLN
INTRAMUSCULAR | Status: AC
  Filled 2017-04-11: qty 2

## 2017-04-11 MED ORDER — TRAMADOL HCL 50 MG PO TABS
ORAL_TABLET | ORAL | Status: AC
  Administered 2017-04-11: 50 mg via ORAL
  Filled 2017-04-11: qty 1

## 2017-04-11 MED ORDER — GLYCOPYRROLATE 0.2 MG/ML IJ SOLN
INTRAMUSCULAR | Status: AC
  Filled 2017-04-11: qty 1

## 2017-04-11 MED ORDER — SODIUM CHLORIDE 0.9 % IV SOLN
INTRAVENOUS | Status: DC
Start: 2017-04-11 — End: 2017-04-11
  Administered 2017-04-11: 09:00:00 via INTRAVENOUS

## 2017-04-11 MED ORDER — SCOPOLAMINE 1 MG/3DAYS TD PT72
MEDICATED_PATCH | TRANSDERMAL | Status: AC
  Administered 2017-04-11: 1.5 mg via TRANSDERMAL
  Filled 2017-04-11: qty 1

## 2017-04-11 MED ORDER — IBUPROFEN 800 MG PO TABS: 800.0000 mg | ORAL_TABLET | Freq: Three times a day (TID) | ORAL | 1 refills | Status: DC

## 2017-04-11 MED ORDER — ONDANSETRON HCL 4 MG/2ML IJ SOLN: 4.0000 mg | Freq: Once | INTRAMUSCULAR | Status: DC | PRN

## 2017-04-11 MED ORDER — FENTANYL CITRATE (PF) 100 MCG/2ML IJ SOLN
INTRAMUSCULAR | Status: DC | PRN
  Administered 2017-04-11 (×2): 75 ug via INTRAVENOUS
  Administered 2017-04-11 (×2): 100 ug via INTRAVENOUS
  Administered 2017-04-11: 150 ug via INTRAVENOUS

## 2017-04-11 MED ORDER — FENTANYL CITRATE (PF) 100 MCG/2ML IJ SOLN
INTRAMUSCULAR | Status: AC
  Administered 2017-04-11: 25 ug via INTRAVENOUS
  Filled 2017-04-11: qty 2

## 2017-04-11 MED ORDER — TRAMADOL HCL 50 MG PO TABS
50.0000 mg | ORAL_TABLET | Freq: Four times a day (QID) | ORAL | Status: DC
  Administered 2017-04-11: 50 mg via ORAL
  Filled 2017-04-11: qty 1

## 2017-04-11 MED ORDER — LACTATED RINGERS IV SOLN: INTRAVENOUS | Status: DC

## 2017-04-11 MED ORDER — TRAMADOL HCL 50 MG PO TABS: 50.0000 mg | ORAL_TABLET | Freq: Four times a day (QID) | ORAL | 0 refills | Status: DC | PRN

## 2017-04-11 SURGICAL SUPPLY — 39 items
ADH SKN CLS APL DERMABOND .7 (GAUZE/BANDAGES/DRESSINGS)
ANCHOR TIS RET SYS 235ML (MISCELLANEOUS) IMPLANT
BAG TISS RTRVL C235 10X14 (MISCELLANEOUS)
BLADE SURG SZ11 CARB STEEL (BLADE) ×3 IMPLANT
CANISTER SUCT 1200ML W/VALVE (MISCELLANEOUS) ×3 IMPLANT
CATH ROBINSON RED A/P 16FR (CATHETERS) ×3 IMPLANT
CHLORAPREP W/TINT 26ML (MISCELLANEOUS) ×3 IMPLANT
DERMABOND ADVANCED (GAUZE/BANDAGES/DRESSINGS)
DERMABOND ADVANCED .7 DNX12 (GAUZE/BANDAGES/DRESSINGS) IMPLANT
DRSG TEGADERM 2-3/8X2-3/4 SM (GAUZE/BANDAGES/DRESSINGS) ×9 IMPLANT
GLOVE BIO SURGEON STRL SZ8 (GLOVE) ×6 IMPLANT
GLOVE INDICATOR 8.0 STRL GRN (GLOVE) ×3 IMPLANT
GOWN STRL REUS W/ TWL LRG LVL3 (GOWN DISPOSABLE) ×4 IMPLANT
GOWN STRL REUS W/ TWL XL LVL3 (GOWN DISPOSABLE) ×2 IMPLANT
GOWN STRL REUS W/TWL LRG LVL3 (GOWN DISPOSABLE) ×6
GOWN STRL REUS W/TWL XL LVL3 (GOWN DISPOSABLE) ×3
IRRIGATION STRYKERFLOW (MISCELLANEOUS) IMPLANT
IRRIGATOR STRYKERFLOW (MISCELLANEOUS) ×3
IV LACTATED RINGERS 1000ML (IV SOLUTION) ×3 IMPLANT
KIT PINK PAD W/HEAD ARE REST (MISCELLANEOUS) ×3
KIT PINK PAD W/HEAD ARM REST (MISCELLANEOUS) ×2 IMPLANT
KIT RM TURNOVER CYSTO AR (KITS) ×3 IMPLANT
LABEL OR SOLS (LABEL) ×3 IMPLANT
NS IRRIG 1000ML POUR BTL (IV SOLUTION) ×3 IMPLANT
NS IRRIG 500ML POUR BTL (IV SOLUTION) ×3 IMPLANT
PACK GYN LAPAROSCOPIC (MISCELLANEOUS) ×3 IMPLANT
PAD OB MATERNITY 4.3X12.25 (PERSONAL CARE ITEMS) ×3 IMPLANT
PAD PREP 24X41 OB/GYN DISP (PERSONAL CARE ITEMS) ×3 IMPLANT
POUCH ENDO CATCH 10MM SPEC (MISCELLANEOUS) IMPLANT
SCISSORS METZENBAUM CVD 33 (INSTRUMENTS) ×1 IMPLANT
SHEARS HARMONIC ACE PLUS 36CM (ENDOMECHANICALS) ×1 IMPLANT
SLEEVE ENDOPATH XCEL 5M (ENDOMECHANICALS) IMPLANT
SUT MNCRL 4-0 (SUTURE) ×3
SUT MNCRL 4-0 27XMFL (SUTURE) ×2
SUT VIC AB 0 UR5 27 (SUTURE) ×3 IMPLANT
SUTURE MNCRL 4-0 27XMF (SUTURE) ×2 IMPLANT
TROCAR ENDO BLADELESS 11MM (ENDOMECHANICALS) ×1 IMPLANT
TROCAR XCEL NON-BLD 5MMX100MML (ENDOMECHANICALS) ×3 IMPLANT
TUBING INSUFFLATOR HI FLOW (MISCELLANEOUS) ×3 IMPLANT
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
## 2017-04-11 NOTE — Op Note (Signed)
OPERATIVE NOTE:  [name] PROCEDURE DATE: 04/11/2017   PREOPERATIVE DIAGNOSIS:  1. Left lower quadrant pain 2. Complex left ovarian cyst  POSTOPERATIVE DIAGNOSIS:  1. Left lower quadrant pain 2. Dense pelvic adhesive disease  PROCEDURE:  1. Laparoscopic adhesiolysis with left oophorectomy  2. Cystoscopy  SURGEON:  Brayton Mars, MD ASSISTANTS: Dr. Jeannie Fend ANESTHESIA: General INDICATIONS: 51 y.o. G4P0010 with history of chronic left lower quadrant pain, worsening, with preoperative workup notable for possible complex left ovarian cyst. Patient desires definitive surgery.  FINDINGS:  Extensive pelvic adhesive disease involving small bowel, cul-de-sac, left adnexa, and left pelvic sidewall. The left ovary was removed. The ureters could not be identified intraoperatively and therefore cystoscopy was performed to verify ureteral patency. There was eflux from each ureteral orifice bilaterally   I/O's: Total I/O In: -  Out: 150 [Urine:150]; EBL: 25 cc COUNTS:  YES SPECIMENS: Left ovary ANTIBIOTIC PROPHYLAXIS:N/A COMPLICATIONS: None immediate  PROCEDURE IN DETAIL: Patient was brought to the operating room and placed in position. General endotracheal anesthesia was induced without difficulty. She was placed in the dorsolithotomy position using the bumblebee stirrups. A Hibiclens and ChloraPrep abdominal perineal intravaginal prep and drape was performed in standard fashion. Timeout was completed. Red Robison catheter was used to drain 150 mL of urine from the bladder. A sponge stick was placed into the vagina to facilitate anatomic orientation intraoperatively. The 5 mm. Optiview laparoscopic trocar was placed directly into the abdominal pelvic cavity following a 5 mm subumbilical incision. No bowel or vascular injury was identified. A 5 mm port was placed in the right lower quadrant under direct visualization. An 11 mm port was placed in the left lower quadrant under  direct visualization. Survey of the abdomen was notable for extensive omental adhesive disease in the upper abdomen as well as in the right lower quadrant. There also was extensive bowel/adnexa/cul-de-sac adhesive disease that had to be lysed. The Ace Harmonic scalpel was used along with graspers to help perform the 90 minutes of adhesiolysis procedure. Eventually there was restoration of somewhat normal anatomy in the left lower quadrant where the ovary was identified, eventually isolated, and resected. The Ace Harmonic scalpel was used to facilitate the oophorectomy. Residual adhesive disease, minimal, was left within the cul-de-sac region because of the density of the adhesions and the patient's lack of symptoms which could have warranted a more extensive adhesiolysis. There was slight bleeding from the infundibulopelvic ligament following the oophorectomy. This was controlled with Kleppinger bipolar forceps. Once satisfied with hemostasis in the pelvis, the 11 mm port incision site was closed with a Carter-Thomason suture LigaSure device. The skin incisions were closed with 4-0 Monocryl suture. Dermabond glue was placed over the incisions. Cystoscopy was then performed in standard fashion. The 60 scope was utilized. Fluorescein dye was injected, 0.5 cc intravenously. Cystoscopy did demonstrate good reflux of fluorescein dye from each ureter. No gross abnormalities were identified with the bladder mucosa. The procedure was then terminated. All instrumentation was removed from the vagina and bladder. Bladder was drained. Patient was then awakened mobilized and taken to recovery room in satisfactory condition.  Marrtin A. Zipporah Plants, MD, A[facility]

## 2017-04-11 NOTE — Transfer of Care (Signed)
Immediate Anesthesia Transfer of Care Note  Patient: Laura [name]  Procedure(s) Performed: Procedure(s): LAPAROSCOPIC LEFT SALPINGO OOPHORECTOMY (Left) CYSTOSCOPY  Patient Location: PACU  Anesthesia Type:General  Level of Consciousness: drowsy  Airway & Oxygen Therapy: Patient Spontanous Breathing and Patient connected to nasal cannula oxygen  Post-op Assessment: Report given to RN and Post -op Vital signs reviewed and stable  Post vital signs: Reviewed and stable  Last Vitals:  Vitals:   04/11/17 0825 04/11/17 1135  BP: (!) 149/99   Pulse: 98 82  Resp: 16 12  Temp: 36.6 C 37.8 C    Last Pain:  Vitals:   04/11/17 0825  TempSrc: Oral  PainSc: 7          Complications: No apparent anesthesia complications

## 2017-04-11 NOTE — Discharge Instructions (Signed)
AMBULATORY SURGERY  DISCHARGE INSTRUCTIONS   1) The drugs that you were given will stay in your system until tomorrow so for the next 24 hours you should not:  A) Drive an automobile B) Make any legal decisions C) Drink any alcoholic beverage   2) You may resume regular meals tomorrow.  Today it is better to start with liquids and gradually work up to solid foods.  You may eat anything you prefer, but it is better to start with liquids, then soup and crackers, and gradually work up to solid foods.   3) Please notify your doctor immediately if you have any unusual bleeding, trouble breathing, redness and pain at the surgery site, drainage, fever, or pain not relieved by medication.    4) Additional Instructions:        Please contact your physician with any problems or Same Day Surgery at [phone], Monday through Friday 6 am to 4 pm, or [place] at [facility], The number at [phone].      Ovarian Cyst An ovarian cyst is a fluid-filled sac on an ovary. The ovaries are organs that make eggs in women. Most ovarian cysts go away on their own and are not cancerous (are benign). Some cysts need treatment. Follow these instructions at home:  Take over-the-counter and prescription medicines only as told by your doctor.  Do not drive or use heavy machinery while taking prescription pain medicine.  Get pelvic exams and Pap tests as often as told by your doctor.  Return to your normal activities as told by your doctor. Ask your doctor what activities are safe for you.  Do not use any products that contain nicotine or tobacco, such as cigarettes and e-cigarettes. If you need help quitting, ask your doctor.  Keep all follow-up visits as told by your doctor. This is important. Contact a doctor if:  Your periods are: ? Late. ? Irregular. ? Painful.  Your periods stop.  You have pelvic pain that does not go away.  You have pressure on your bladder.  You have  trouble making your bladder empty when you pee (urinate).  You have pain during sex.  You have any of the following in your belly (abdomen): ? A feeling of fullness. ? Pressure. ? Discomfort. ? Pain that does not go away. ? Swelling.  You feel sick most of the time.  You have trouble pooping (have constipation).  You are not as hungry as usual (you lose your appetite).  You get very bad acne.  You start to have more hair on your body and face.  You are gaining weight or losing weight without changing your exercise and eating habits.  You think you may be pregnant. Get help right away if:  You have belly pain that is very bad or gets worse.  You cannot eat or drink without throwing up (vomiting).  You suddenly get a fever.  Your period is a lot heavier than usual. This information is not intended to replace advice given to you by your health care provider. Make sure you discuss any questions you have with your health care provider. Document Released: 04/05/2008 Document Revised: 05/07/2016 Document Reviewed: 03/21/2016 Elsevier Interactive Patient Education  2017 [employer].

## 2017-04-11 NOTE — Anesthesia Post-op Follow-up Note (Cosign Needed)
Anesthesia QCDR form completed.        

## 2017-04-11 NOTE — Anesthesia Preprocedure Evaluation (Addendum)
Anesthesia Evaluation  Patient identified by MRN, date of birth, ID band Patient awake    Reviewed: Allergy & Precautions, NPO status , Patient's Chart, lab work & pertinent test results  Airway Mallampati: II  TM Distance: >3 FB     Dental no notable dental hx.    Pulmonary asthma , COPD,  COPD inhaler, former smoker,    Pulmonary exam normal        Cardiovascular hypertension, + angina with exertion + Peripheral Vascular Disease  Normal cardiovascular exam+ dysrhythmias + Valvular Problems/Murmurs      Neuro/Psych  Headaches, PSYCHIATRIC DISORDERS Anxiety Depression    GI/Hepatic GERD  ,  Endo/Other  Hypothyroidism   Renal/GU Renal InsufficiencyRenal disease  negative genitourinary   Musculoskeletal  (+) Arthritis , Osteoarthritis,  Fibromyalgia -, narcotic dependent  Abdominal Normal abdominal exam  (+)   Peds negative pediatric ROS (+)  Hematology   Anesthesia Other Findings Past Medical History: No date: Anginal pain ([place])     Comment: "stress related" No date: Anxiety and depression No date: Asthma No date: Bacterial vaginitis No date: COPD (chronic obstructive pulmonary disease) (* No date: Cystocele No date: Dysrhythmia No date: Elevated fasting blood sugar No date: Exposure to hepatitis C No date: Fibromyalgia No date: GERD (gastroesophageal reflux disease) No date: Heart murmur No date: High cholesterol No date: Hypertension No date: Hypokalemia No date: IBS (irritable bowel syndrome) No date: Increased BMI No date: Kidney disease No date: Kidney stones No date: Osteoarthritis No date: Pre-diabetes 12/23/2015: Prediabetes     Comment: Overview:  Hba1c higher but not diabetic. Took              metformin to try to lessen No date: Raynaud disease No date: Raynaud's disease No date: Rectocele No date: Urinary retention with incomplete bladder empt* No date: Vaginal dryness, menopausal No  date: Vaginal enterocele 12/03/2014: Vitamin D deficiency No date: Yeast infection   Noted some stiffness of neck when extending and moving side to side   Reproductive/Obstetrics                            Anesthesia Physical Anesthesia Plan  ASA: III  Anesthesia Plan: General   Post-op Pain Management:    Induction: Intravenous  PONV Risk Score and Plan: 4 or greater and Ondansetron, Dexamethasone, Propofol, Midazolam, Scopolamine patch - Pre-op and Treatment may vary due to age or medical condition  Airway Management Planned: Oral ETT  Additional Equipment:   Intra-op Plan:   Post-operative Plan: Extubation in OR  Informed Consent: I have reviewed the patients History and Physical, chart, labs and discussed the procedure including the risks, benefits and alternatives for the proposed anesthesia with the patient or authorized representative who has indicated his/her understanding and acceptance.   Dental advisory given  Plan Discussed with: CRNA and Surgeon  Anesthesia Plan Comments:         Anesthesia Quick Evaluation

## 2017-04-11 NOTE — Anesthesia Procedure Notes (Signed)
Procedure Name: Intubation Date/Time: 04/11/2017 9:16 AM Performed by: Rosaria Ferries, Sylvio Weatherall Pre-anesthesia Checklist: Patient identified, Emergency Drugs available, Suction available and Patient being monitored Patient Re-evaluated:Patient Re-evaluated prior to inductionOxygen Delivery Method: Circle system utilized Preoxygenation: Pre-oxygenation with 100% oxygen Intubation Type: IV induction Laryngoscope Size: Mac and 3 Grade View: Grade I Tube size: 7.0 mm Number of attempts: 1 Placement Confirmation: ETT inserted through vocal cords under direct vision,  breath sounds checked- equal and bilateral and positive ETCO2 Secured at: 21 cm Tube secured with: Tape Dental Injury: Teeth and Oropharynx as per pre-operative assessment

## 2017-04-11 NOTE — Interval H&P Note (Signed)
History and Physical Interval Note:  04/11/2017 8:51 AM  Laura [name]  has presented today for surgery, with the diagnosis of COMPLEX OVARIAN CYST, LLQ PAIN  The various methods of treatment have been discussed with the patient and family. After consideration of risks, benefits and other options for treatment, the patient has consented to  Procedure(s): LAPAROSCOPIC LEFT SALPINGO OOPHORECTOMY (Left) as a surgical intervention .  The patient's history has been reviewed, patient examined, no change in status, stable for surgery.  I have reviewed the patient's chart and labs.  Questions were answered to the patient's satisfaction.     Hassell Done A Defrancesco

## 2017-04-12 ENCOUNTER — Encounter: Payer: Self-pay | Admitting: Obstetrics and Gynecology

## 2017-04-12 ENCOUNTER — Telehealth: Payer: Self-pay

## 2017-04-12 ENCOUNTER — Ambulatory Visit (INDEPENDENT_AMBULATORY_CARE_PROVIDER_SITE_OTHER): Payer: BLUE CROSS/BLUE SHIELD | Admitting: Obstetrics and Gynecology

## 2017-04-12 VITALS — BP 120/80 | HR 69 | Temp 97.6°F | Wt 204.2 lb

## 2017-04-12 DIAGNOSIS — T7840XA Allergy, unspecified, initial encounter: Secondary | ICD-10-CM | POA: Diagnosis not present

## 2017-04-12 DIAGNOSIS — Z4889 Encounter for other specified surgical aftercare: Secondary | ICD-10-CM

## 2017-04-12 LAB — SURGICAL PATHOLOGY

## 2017-04-12 MED ORDER — OXYCODONE-ACETAMINOPHEN 2.5-325 MG PO TABS: 1.0000 | ORAL_TABLET | ORAL | 0 refills | Status: DC | PRN

## 2017-04-12 NOTE — Telephone Encounter (Signed)
Ovarian cyst removed yesterday. Incision bleeding. Incision  No drainage other than blood. Pt states it is open about 1/8 inch. No fever. Pain scale 7. Tramadol has been making her itch. Pain medication keeping her awake. Pt scheduled an appt at 10:45am

## 2017-04-12 NOTE — Anesthesia Postprocedure Evaluation (Signed)
Anesthesia Post Note  Patient: Laura [name]  Procedure(s) Performed: Procedure(s) (LRB): LAPAROSCOPIC LEFT SALPINGO OOPHORECTOMY (Left) CYSTOSCOPY  Patient location during evaluation: PACU Anesthesia Type: General Level of consciousness: awake and alert and oriented Pain management: pain level controlled Vital Signs Assessment: post-procedure vital signs reviewed and stable Respiratory status: spontaneous breathing Cardiovascular status: blood pressure returned to baseline Anesthetic complications: no     Last Vitals:  Vitals:   04/11/17 1245 04/11/17 1252  BP: 122/78 121/78  Pulse: 68 69  Resp: 16 18  Temp: 36.9 C     Last Pain:  Vitals:   04/11/17 1245  TempSrc:   PainSc: 5                  Kole Hilyard

## 2017-04-12 NOTE — Addendum Note (Signed)
Addended by: Raliegh Ip on: 04/12/2017 12:08 PM   Modules accepted: Orders

## 2017-04-12 NOTE — Progress Notes (Signed)
HPI:      Laura [name] is a 51 y.o. G4P0010 who LMP was No LMP recorded. Patient has had a hysterectomy.  Subjective:   She presents today With complaint of an allergic reaction to tramadol causing itching and hives mainly on her face. She has taken Benadryl but it is not helping at this time.  We have discussed her pain medication and her allergic reactions in detail. She states that she has never tried Ambulance person and is willing to try this. I have explained to her that this may also cause an allergic reaction but other choices are limited. She also complains that her left lower quadrant port incision drained "huge amounts"of blood. Has had some drainage today but not very much. Patient passing flatus. Denies nausea and vomiting. Urinating without difficulty.    Hx: The following portions of the patient's history were reviewed and updated as appropriate:             She  has a past medical history of Anginal pain ([place]); Anxiety and depression; Asthma; Bacterial vaginitis; COPD (chronic obstructive pulmonary disease) ([place]); Cystocele; Dysrhythmia; Elevated fasting blood sugar; Exposure to hepatitis C; Fibromyalgia; GERD (gastroesophageal reflux disease); Heart murmur; High cholesterol; Hypertension; Hypokalemia; IBS (irritable bowel syndrome); Increased BMI; Kidney disease; Kidney stones; Osteoarthritis; Pre-diabetes; Prediabetes (12/23/2015); Raynaud disease; Raynaud's disease; Rectocele; Urinary retention with incomplete bladder emptying; Vaginal dryness, menopausal; Vaginal enterocele; Vitamin D deficiency (12/03/2014); and Yeast infection. She  does not have any pertinent problems on file. She  has a past surgical history that includes thumb surgery; Partial hysterectomy; Appendectomy; Abdominal hysterectomy; Ankle surgery; Lithotripsy; Colporrhaphy (2015); Laparoscopic salpingo oophorectomy (Left, 04/11/2017); Cystoscopy (04/11/2017); and Oophorectomy. Her family history includes Breast cancer (age  of onset: 42) in her mother; Diabetes in her father; Stroke in her father. She  reports that she quit smoking about 22 years ago. She has never used smokeless tobacco. She reports that she drinks about 0.6 oz of alcohol per week . She reports that she does not use drugs. She is allergic to meperidine; shellfish allergy; acebutolol; codeine; hydrocodone; metoprolol; mirtazapine; sectral [acebutolol hcl]; and tramadol.       Review of Systems:  Review of Systems  Constitutional: Denied constitutional symptoms, night sweats, recent illness, fatigue, fever, insomnia and weight loss.  Eyes: Denied eye symptoms, eye pain, photophobia, vision change and visual disturbance.  Ears/Nose/Throat/Neck: Denied ear, nose, throat or neck symptoms, hearing loss, nasal discharge, sinus congestion and sore throat.  Cardiovascular: Denied cardiovascular symptoms, arrhythmia, chest pain/pressure, edema, exercise intolerance, orthopnea and palpitations.  Respiratory: Denied pulmonary symptoms, asthma, pleuritic pain, productive sputum, cough, dyspnea and wheezing.  Gastrointestinal: Denied, gastro-esophageal reflux, melena, nausea and vomiting.  Genitourinary: See HPI for additional information.  Musculoskeletal: Denied musculoskeletal symptoms, stiffness, swelling, muscle weakness and myalgia.  Dermatologic: Denied dermatology symptoms, rash and scar.  Neurologic: Denied neurology symptoms, dizziness, headache, neck pain and syncope.  Psychiatric: Denied psychiatric symptoms, anxiety and depression.  Endocrine: Denied endocrine symptoms including hot flashes and night sweats.   Meds:   Current Outpatient Prescriptions on File Prior to Visit  Medication Sig Dispense Refill  . albuterol (PROVENTIL HFA;VENTOLIN HFA) 108 (90 Base) MCG/ACT inhaler Inhale 2 puffs into the lungs every 4 (four) hours as needed for wheezing or shortness of breath. 1 Inhaler 1  . ALPRAZolam (XANAX) 1 MG tablet Take 1 mg by mouth 4 (four)  times daily.     Marland Kitchen amphetamine-dextroamphetamine (ADDERALL) 30 MG tablet TAKE 1 TABLET BY MOUTH  EVERY MORNING AND TAKE 1 TABLET AT NOON AND TAKE 1 TABLET AT 4PM  0  . Cetirizine HCl 10 MG CAPS Take 10 mg by mouth daily.    . clobetasol cream (TEMOVATE) 8.29 % Apply 1 application topically 2 (two) times daily. (Patient taking differently: Apply 1 application topically 2 (two) times daily as needed. ) 30 g 0  . diclofenac sodium (VOLTAREN) 1 % GEL Apply 1 application topically 2 (two) times daily as needed. Reported on 02/19/2016    . DULoxetine (CYMBALTA) 60 MG capsule Take 120 mg by mouth daily.    Marland Kitchen EPINEPHRINE 0.3 mg/0.3 mL IJ SOAJ injection Inject 0.3 mLs (0.3 mg total) into the muscle once. 2 Device 1  . furosemide (LASIX) 20 MG tablet Take 1 tablet (20 mg total) by mouth daily. (Patient taking differently: Take 20 mg by mouth 2 (two) times daily. ) 90 tablet 1  . glucose blood ([employer] TEST STRIPS) test strip     . ibuprofen (ADVIL,MOTRIN) 800 MG tablet Take 1 tablet (800 mg total) by mouth 3 (three) times daily. 30 tablet 1  . levothyroxine (SYNTHROID, LEVOTHROID) 125 MCG tablet Take 1 tablet (125 mcg total) by mouth daily before breakfast. 90 tablet 3  . magnesium oxide (MAG-OX) 400 MG tablet Take 1 tablet (400 mg total) by mouth 2 (two) times daily. (Patient taking differently: Take 400 mg by mouth daily. ) 60 tablet 2  . pantoprazole (PROTONIX) 40 MG tablet TAKE 1 TABLET BY MOUTH DAILY 30 tablet 5  . polyethylene glycol powder (GLYCOLAX/MIRALAX) powder Take 17 g by mouth daily. (Patient taking differently: Take 17 g by mouth daily as needed for moderate constipation. ) 3350 g 1  . potassium chloride (K-DUR,KLOR-CON) 10 MEQ tablet Two tablets by mouth in the morning, one tablet in the afternoon 90 tablet 1  . tiZANidine (ZANAFLEX) 4 MG tablet TAKE 1 TABLET (4 MG TOTAL) BY MOUTH EVERY 8 (EIGHT) HOURS AS NEEDED FOR MUSCLE SPASMS. 30 tablet 0  . traMADol (ULTRAM) 50 MG tablet Take 1 tablet (50 mg  total) by mouth every 6 (six) hours as needed. 20 tablet 0  . valsartan (DIOVAN) 320 MG tablet Take 1 tablet (320 mg total) by mouth daily. 90 tablet 0  . zolpidem (AMBIEN) 10 MG tablet Take 10 mg by mouth at bedtime.     No current facility-administered medications on file prior to visit.     Objective:     Vitals:   04/12/17 1113  BP: 120/80  Pulse: 69               Abdomen: Soft.  Mildly-tender.  No masses.  No HSM. No rebound  No guarding.  Incision/s: Intact.  Healing well.  No erythema.  No drainage at this time.  Bruising at LLQ port site.        Assessment:    G4P0010 Patient Active Problem List   Diagnosis Date Noted  . Status post hysterectomy 03/08/2017  . Left lower quadrant pain 03/08/2017  . Complex ovarian cyst, left 03/08/2017  . Barrett's esophagus 11/02/2016  . Difficulty with speech 10/18/2016  . Muscle spasm 10/18/2016  . Headache 10/18/2016  . Memory impairment 10/18/2016  . Chronic pain syndrome 10/12/2016  . Chronic low back pain (Location of Tertiary source of pain) (Bilateral) (L>R) 10/12/2016  . Lumbar facet joint syndrome ([place]) 10/12/2016  . Chronic sacroiliac joint pain 10/12/2016  . SOB (shortness of breath) on exertion 10/12/2016  . Chronic shoulder pain (Location of  Primary Source of Pain) (Bilateral) (R>L) 10/12/2016  . Chronic neck pain (Location of Secondary source of pain) (Bilateral) (R>L) 10/12/2016  . Chronic upper back pain (Bilateral) (L>R) 10/12/2016  . Chronic hand pain (Bilateral) (R>L) 10/12/2016  . Chronic hand pain (Right) 10/12/2016  . Chronic hand pain (Left) 10/12/2016  . Chronic knee pain (Right) 10/12/2016  . Osteoarthritis of knee (Right) 10/12/2016  . Asthma without status asthmaticus 10/11/2016  . CKD (chronic kidney disease) 10/11/2016  . Hyperlipidemia, unspecified 10/11/2016  . Thyroid disease 10/11/2016  . Long term current use of opiate analgesic 10/11/2016  . Long term prescription opiate use 10/11/2016   . Opiate use 10/11/2016  . Vitamin B12 deficiency 07/26/2016  . Anxiety 07/06/2016  . Depression 07/06/2016  . Generalized osteoarthritis of hand 07/06/2016  . Raynaud's disease without gangrene 07/06/2016  . Raynaud disease 05/06/2016  . Breast cancer screening 04/14/2016  . Pterygium of left eye 03/24/2016  . Musculoskeletal pain 03/02/2016  . Fibromyalgia 02/13/2016  . Hypomagnesemia syndrome 01/05/2016  . Weakness 12/30/2015  . Shellfish allergy 12/30/2015  . Prediabetes 12/23/2015  . Hypomagnesemia 12/23/2015  . Hypokalemia 12/17/2015  . Localized edema 12/17/2015  . Heart palpitations 12/16/2015  . Heart murmur 12/16/2015  . Hyperlipidemia LDL goal <100 11/19/2015  . GERD without esophagitis 06/13/2015  . Bilateral lower extremity edema 06/06/2015  . Hot flashes due to surgical menopause 06/06/2015  . Chronic pain of multiple joints 06/06/2015  . Osteoarthrosis, generalized, multiple joints 06/06/2015  . Vaginal enterocele   . Urinary retention with incomplete bladder emptying   . Vaginal dryness, menopausal   . Obesity, Class I, BMI 30.0-34.9 (see actual BMI)   . Rectocele   . Cystocele   . Anxiety and depression   . Acquired hypothyroidism 12/03/2014  . Hypertension goal BP (blood pressure) < 140/90 12/03/2014  . Asthma, mild intermittent 12/03/2014  . Vitamin D deficiency 12/03/2014  . Mild intermittent asthma without complication 37/85/8850  . Insomnia 08/31/2013     1. Encounter for postoperative wound care   2. Allergic reaction to drug, initial encounter     Possibly had drainage of a seroma yesterday. No evidence of drainage today. Wounds look excellent and are well approximated.  Allergic reaction to tramadol. We will try Percocet. ( Have discussed possible allergic reaction in detail with the patient)  use of Benadryl also discussed.  Narcotic choices very limited.   Plan:            1.  Stop tramadol, we will try Percocet.  2. Benadryl for  current allergy reaction.  3.  Wound care discussed in detail. Orders No orders of the defined types were placed in this encounter.    Meds ordered this encounter  Medications  . oxycodone-acetaminophen (PERCOCET) 2.5-325 MG tablet    Sig: Take 1 tablet by mouth every 4 (four) hours as needed for pain.    Dispense:  30 tablet    Refill:  0        F/U  Return for As Scheduled Post-op.  Finis Bud, M.D. 04/12/2017 11:52 AM

## 2017-04-13 ENCOUNTER — Telehealth: Payer: Self-pay | Admitting: Obstetrics and Gynecology

## 2017-04-13 ENCOUNTER — Other Ambulatory Visit: Payer: Self-pay | Admitting: Obstetrics and Gynecology

## 2017-04-13 NOTE — Telephone Encounter (Signed)
Patient left VM that she was up all night itching due to the medication - Is there another medication she can be given for pain?? Please call

## 2017-04-13 NOTE — Telephone Encounter (Signed)
Pt was given morphine 15mg  due to continually having problems with generalized itching when using  various pain meds. Pt notified she needs to pick script up from office. States husband will be by to pick it up.

## 2017-04-19 ENCOUNTER — Other Ambulatory Visit: Payer: Self-pay | Admitting: Family Medicine

## 2017-04-19 NOTE — Telephone Encounter (Signed)
Patient has Ambien, Ultram, Xanax, and Percocet in her current medicine list, in addition to her Adderall I am NOT comfortable with all of these (I don't prescribe any of them) Please let her know that I'm concerned about her medicines, as they can interact and result in accidental overdose and death I am NOT comfortable prescribing her any Zanaflex (requested) and will remove it from her medicine list If she wants help or thinks she has a problem with addiction to controlled substances, we are here to help and not judge; we are just worried about her

## 2017-04-19 NOTE — Telephone Encounter (Signed)
Left detailed voicemail

## 2017-04-20 ENCOUNTER — Ambulatory Visit (INDEPENDENT_AMBULATORY_CARE_PROVIDER_SITE_OTHER): Payer: BLUE CROSS/BLUE SHIELD | Admitting: Obstetrics and Gynecology

## 2017-04-20 ENCOUNTER — Encounter: Payer: Self-pay | Admitting: Obstetrics and Gynecology

## 2017-04-20 VITALS — BP 134/80 | HR 102 | Ht 65.0 in | Wt 196.2 lb

## 2017-04-20 DIAGNOSIS — Z09 Encounter for follow-up examination after completed treatment for conditions other than malignant neoplasm: Secondary | ICD-10-CM

## 2017-04-20 DIAGNOSIS — R509 Fever, unspecified: Secondary | ICD-10-CM | POA: Diagnosis not present

## 2017-04-20 DIAGNOSIS — N83299 Other ovarian cyst, unspecified side: Secondary | ICD-10-CM

## 2017-04-20 DIAGNOSIS — R1032 Left lower quadrant pain: Secondary | ICD-10-CM

## 2017-04-20 LAB — POCT URINALYSIS DIPSTICK
Bilirubin, UA: NEGATIVE
Blood, UA: NEGATIVE
Glucose, UA: NEGATIVE
KETONES UA: NEGATIVE
NITRITE UA: NEGATIVE
PH UA: 6 (ref 5.0–8.0)
PROTEIN UA: NEGATIVE
Spec Grav, UA: 1.01 (ref 1.010–1.025)
UROBILINOGEN UA: 0.2 U/dL

## 2017-04-20 MED ORDER — ESTRADIOL 1 MG PO TABS: 1.0000 mg | ORAL_TABLET | Freq: Every day | ORAL | 12 refills | Status: DC

## 2017-04-20 NOTE — Progress Notes (Signed)
Chief complaint: 1. Postop check 2. Status post laparoscopic adhesiolysis with left oophorectomy 3. History of chronic left lower quadrant pain 4. Vasomotor symptoms  Patient presents for follow-up 3 weeks after laparoscopic surgery for removal of complex left ovarian cyst noted on ultrasound during workup for evaluation of left lower quadrant pain.  Pathology revealed simple ovarian cyst without any other pathologic changes. Findings from surgery were reviewed including extensive adhesive disease involving the bowel, omentum, anterior abdominal wall, pelvic sidewalls, and adnexa.  Since surgery patient has had a rocky course with significant narcotic tolerance. She finally is taking oral morphine with some success for severe exacerbations of pain. Patient is needing to use routine dosing of Advil which tends to control pain except prior to bedtime. Bowel and bladder function are normal. Patient reports having fever of 101F yesterday. She does not complain of any focal symptoms of upper respiratory infection, chronic cough, extremity tenderness, lower extremity edema, nausea or vomiting, or food intolerance. She does not report significant UTI symptoms although she has difficulty with starting her urine stream and feeding it. She states that she feels very similar to when she gets symptomatic kidney stones. She has not noted any blood in her urine.  Past medical history, past surgical history, problem list, medications, and allergies are reviewed  OBJECTIVE: BP 134/80   Pulse (!) 102   Ht 5\' 5"  (1.651 m)   Wt 196 lb 3.2 oz (89 kg)   BMI 32.65 kg/m  Pleasant female, alert and oriented, in no acute distress Back: No CVA tenderness Abdomen: Soft, without peritoneal signs; there is moderate ecchymoses in the left lower quadrant port sites without palpable mass or hernia. Mild ecchymosis is noted in the right lower quadrant port site. Subumbilical incision site is well approximated and without  evidence of erythema or drainage  ASSESSMENT: 1. Postop check, 3 weeks status post laparoscopic adhesiolysis and left oophorectomy 2. Isolated fever to 101F yesterday without focal symptomatology 3. UTI symptoms with a sense of stretching or pulling the left lower quadrant and she voids (similar to when she has renal lithiasis) 4. Surgical menopause, symptomatic, vasomotor symptoms  PLAN: 1. Urinalysis and urine culture to rule out hematuria or UTI 2. CBC 3. Return in 2 weeks for follow-up or sooner if symptoms worsen regarding recurrent fever, worsening abdominal pain, or UTI symptoms. 4. Begin estradiol 1 mg a day  Brayton Mars, MD  Note: This dictation was prepared with Dragon dictation along with smaller phrase technology. Any transcriptional errors that result from this process are unintentional.

## 2017-04-20 NOTE — Patient Instructions (Signed)
1. Begin estradiol 1 mg a day 2. Continue with ibuprofen as needed for abdominal pelvic pain 3. Return in 2 weeks for follow-up or sooner if worsening symptoms develop 4. Urinalysis with urine culture is obtained today 5. CBC is obtained today.

## 2017-04-21 LAB — CBC WITH DIFFERENTIAL/PLATELET
BASOS ABS: 0 10*3/uL (ref 0.0–0.2)
BASOS: 0 %
EOS (ABSOLUTE): 0.3 10*3/uL (ref 0.0–0.4)
Eos: 3 %
Hematocrit: 39.6 % (ref 34.0–46.6)
Hemoglobin: 13 g/dL (ref 11.1–15.9)
IMMATURE GRANULOCYTES: 0 %
Immature Grans (Abs): 0 10*3/uL (ref 0.0–0.1)
LYMPHS: 26 %
Lymphocytes Absolute: 2.5 10*3/uL (ref 0.7–3.1)
MCH: 27.9 pg (ref 26.6–33.0)
MCHC: 32.8 g/dL (ref 31.5–35.7)
MCV: 85 fL (ref 79–97)
MONOS ABS: 0.9 10*3/uL (ref 0.1–0.9)
Monocytes: 9 %
NEUTROS ABS: 5.7 10*3/uL (ref 1.4–7.0)
NEUTROS PCT: 62 %
PLATELETS: 416 10*3/uL — AB (ref 150–379)
RBC: 4.66 x10E6/uL (ref 3.77–5.28)
RDW: 14.8 % (ref 12.3–15.4)
WBC: 9.4 10*3/uL (ref 3.4–10.8)

## 2017-04-22 LAB — URINE CULTURE

## 2017-04-24 ENCOUNTER — Other Ambulatory Visit: Payer: Self-pay | Admitting: Family Medicine

## 2017-04-25 ENCOUNTER — Telehealth: Payer: Self-pay

## 2017-04-25 MED ORDER — NITROFURANTOIN MONOHYD MACRO 100 MG PO CAPS: 100.0000 mg | ORAL_CAPSULE | Freq: Two times a day (BID) | ORAL | 0 refills | Status: DC

## 2017-04-25 NOTE — Telephone Encounter (Signed)
Pt aware. Med erx. 

## 2017-04-25 NOTE — Telephone Encounter (Signed)
-----   Message from Brayton Mars, MD sent at 04/22/2017  3:17 PM EDT ----- Please notify - Abnormal Labs UTI present. Call in [place] twice a day for 7 days

## 2017-04-27 ENCOUNTER — Other Ambulatory Visit: Payer: Self-pay | Admitting: Family Medicine

## 2017-04-27 NOTE — Telephone Encounter (Signed)
Pt needs refill on Valsartan 320mg . Pharm is [facility]. Says that pharm sent a request several days ago, but explained that dr is on vac. She is out.

## 2017-04-28 MED ORDER — VALSARTAN 320 MG PO TABS: 320.0000 mg | ORAL_TABLET | Freq: Every day | ORAL | 0 refills | Status: DC

## 2017-04-28 NOTE — Telephone Encounter (Addendum)
Sent to [facility] in [place]. [facility] was [facility] [place]. [facility] and [facility]. The pharmacist will have it cancel at the [place].

## 2017-05-03 ENCOUNTER — Telehealth: Payer: Self-pay | Admitting: Obstetrics and Gynecology

## 2017-05-03 NOTE — Telephone Encounter (Signed)
Note appears to be blank; signing off 

## 2017-05-03 NOTE — Telephone Encounter (Signed)
Pt states the Macrobid helped. She is having slight discomfort but not has bad as it was at her visit on 6/20. Advised her to come by and drop off a urine. She states she will wait until her visit on 7/10. If sx gets worse she will contact office. Advised to push h20 and cranberry juice.

## 2017-05-03 NOTE — Telephone Encounter (Signed)
Patient called wanting to speak with Joyice Faster, She stated that her medication is working well and improving condition but would like another refill. Patient did not disclose another information. Please advise.

## 2017-05-10 ENCOUNTER — Telehealth: Payer: Self-pay

## 2017-05-10 ENCOUNTER — Ambulatory Visit (INDEPENDENT_AMBULATORY_CARE_PROVIDER_SITE_OTHER): Payer: BLUE CROSS/BLUE SHIELD | Admitting: Obstetrics and Gynecology

## 2017-05-10 ENCOUNTER — Encounter: Payer: Self-pay | Admitting: Obstetrics and Gynecology

## 2017-05-10 DIAGNOSIS — N736 Female pelvic peritoneal adhesions (postinfective): Secondary | ICD-10-CM | POA: Insufficient documentation

## 2017-05-10 DIAGNOSIS — R1032 Left lower quadrant pain: Secondary | ICD-10-CM

## 2017-05-10 DIAGNOSIS — N951 Menopausal and female climacteric states: Secondary | ICD-10-CM | POA: Diagnosis not present

## 2017-05-10 DIAGNOSIS — N390 Urinary tract infection, site not specified: Secondary | ICD-10-CM | POA: Diagnosis not present

## 2017-05-10 HISTORY — DX: Female pelvic peritoneal adhesions (postinfective): N73.6

## 2017-05-10 MED ORDER — EST ESTROGENS-METHYLTEST 1.25-2.5 MG PO TABS: 1.0000 | ORAL_TABLET | Freq: Every day | ORAL | 5 refills | Status: DC

## 2017-05-10 NOTE — Patient Instructions (Signed)
1. Stop Estrace 1 mg daily tablets 2. Begin Estratest 1.25/2.5 mg daily 3. Return in 6 weeks for follow-up on vasomotor symptoms 4. Urinalysis with urine culture is ordered today to verify UTI is resolved  Esterified Estrogens; Methyltestosterone tablets What is this medicine? ESTERIFIED ESTROGENS; METHYLTESTOSTERONE (es TAIR i fyed ES troe jenz; meth il tes TOS te rone) is a combination of hormones. This medicine is used to treat some of the symptoms of menopause like hot flashes and vaginal dryness. This medicine may be used for other purposes; ask your health care provider or pharmacist if you have questions. COMMON BRAND NAME(S): Covaryx, Covaryx H.S., EEMT, EEMT HS, Essian, Essian HS, Estratest, Estratest HS, Syntest DS, Syntest HS What should I tell my health care provider before I take this medicine? They need to know if you have any of these conditions: -abnormal vaginal bleeding -blood vessel disease or blood clots -breast, cervical, endometrial, ovarian, liver, or uterine cancer -dementia -diabetes -gallbladder disease -heart disease or recent heart attack -high blood pressure -high cholesterol -high level of calcium in the blood -hysterectomy -kidney disease -liver disease -migraine headaches -stroke -systemic lupus erythematosus (SLE) -tobacco smoker -vaginal bleeding -an unusual or allergic reaction to estrogens, other hormones, medicines, foods, dyes, or preservatives -pregnant or trying to get pregnant -breast-feeding How should I use this medicine? Take this medicine by mouth with a glass of water. To reduce nausea, this medicine may be taken with food. Follow the directions on the prescription label. Take this medicine at the same time each day. Do not take your medicine more often than directed. Talk to your pediatrician regarding the use of this medicine in children. Special care may be needed. A patient package insert for the product will be given with each  prescription and refill. Read this sheet carefully each time. The sheet may change frequently. Overdosage: If you think you have taken too much of this medicine contact a poison control center or emergency room at once. NOTE: This medicine is only for you. Do not share this medicine with others. What if I miss a dose? If you miss a dose, take it as soon as you can. If it is almost time for your next dose, take only that dose. Do not take double or extra doses. What may interact with this medicine? Do not take this medicine with any of the following medications: -medicines for cancer like aminoglutethimide, anastrozole, exemestane, letrozole, testolactone, vorozole This medicine may also interact with the following medications: -antibiotics like erythromycin, clarithromycin -carbamazepine -female hormones, like estrogens or progestins and birth control pills -grapefruit juice -herbal remedies for menopause or female problems -insulin -itraconazole -ketoconazole -medicines that treat or prevent blood clots like warfarin -oxyphenbutazone -phenobarbital -rifampin -ritonavir -St. John's Wort This list may not describe all possible interactions. Give your health care provider a list of all the medicines, herbs, non-prescription drugs, or dietary supplements you use. Also tell them if you smoke, drink alcohol, or use illegal drugs. Some items may interact with your medicine. What should I watch for while using this medicine? Visit your doctor or health care professional for regular checks on your progress. You will need a regular breast and pelvic exam and Pap smear while on this medicine. You should also discuss the need for regular mammograms with your health care professional, and follow his or her guidelines for these tests. This medicine can make your body retain fluid, making your fingers, hands, or ankles swell. Your blood pressure can go up. Contact your  doctor or health care professional  if you feel you are retaining fluid. If you have any reason to think you are pregnant, stop taking this medicine right away and contact your doctor or health care professional. Smoking increases the risk of getting a blood clot or having a stroke while you are taking this medicine, especially if you are more than 52 years old. You are strongly advised not to smoke. If you wear contact lenses and notice visual changes, or if the lenses begin to feel uncomfortable, consult your eye doctor or health care professional. This medicine can increase the risk of developing a condition (endometrial hyperplasia) that may lead to cancer of the lining of the uterus. Taking progestins, another hormone drug, with this medicine lowers the risk of developing this condition. Therefore, if your uterus has not been removed (by a hysterectomy), your doctor may prescribe a progestin for you to take together with your estrogen. You should know, however, that taking estrogens with progestins may have additional health risks. You should discuss the use of estrogens and progestins with your health care professional to determine the benefits and risks for you. If you are going to have surgery, you may need to stop taking this medicine. Consult your health care professional for advice before you schedule the surgery. What side effects may I notice from receiving this medicine? Side effects that you should report to your doctor or health care professional as soon as possible: -allergic reactions like skin rash, itching or hives, swelling of the face, lips, or tongue -breast tissue changes or discharge -changes in vision -chest pain -confusion, trouble speaking or understanding -dark urine -general ill feeling or flu-like symptoms -light-colored stools -nausea, vomiting -pain, swelling, warmth in the leg -right upper belly pain -severe headaches -shortness of breath -sudden numbness or weakness of the face, arm or  leg -trouble walking, dizziness, loss of balance or coordination -unusual vaginal bleeding -yellowing of the eyes or skin Side effects that usually do not require medical attention (report to your doctor or health care professional if they continue or are bothersome): -hair loss -increased hunger or thirst -increased urination -symptoms of vaginal infection like itching, irritation or unusual discharge -unusually weak or tired This list may not describe all possible side effects. Call your doctor for medical advice about side effects. You may report side effects to FDA at 1-800-FDA-1088. Where should I keep my medicine? Keep out of the reach of children. Store at room temperature between 15 and 30 degrees C (59 and 86 degrees F). Throw away any unused medicine after the expiration date. NOTE: This sheet is a summary. It may not cover all possible information. If you have questions about this medicine, talk to your doctor, pharmacist, or health care provider.  2018 Elsevier/Gold Standard (2008-10-03 11:46:40)

## 2017-05-10 NOTE — Telephone Encounter (Signed)
Pt aware.

## 2017-05-10 NOTE — Progress Notes (Signed)
Chief complaint: 1. Left lower quadrant pain 2. Status post UTI therapy 3. Status post laparoscopic adhesiolysis with left oophorectomy 1 month ago 4. Vasomotor symptoms  Patient reports feeling much improved since her last visit 2 weeks ago. She was diagnosed with Escherichia coli UTI and treated with antibiotics. She is not having any urinary frequency, urgency or dysuria; she is however, experiencing the dull achy left mid abdominal discomfort which she commonly associates with her kidney stones. Vasomotor symptoms are improved but not completely resolved with Estrace 1 mg daily tablets.  Past medical history, past surgical history, problem list, medications, and allergies are reviewed  OBJECTIVE: There were no vitals taken for this visit.  Pleasant female, alert and oriented, in no acute distress Back: No CVA tenderness Abdomen: Soft, without peritoneal signs; moderate ecchymoses is resolved in the left lower quadrant port sites; no palpable mass or hernia. Mild ecchymosis in the right lower quadrant port site is resolved. Subumbilical incision site is well approximated and without evidence of erythema or drainage.  ASSESSMENT: 1. Status post laparoscopic adhesiolysis with left oophorectomy 1 month ago, improving symptom complex 2. UTI, treated, and asymptomatic at this time 3. Vasomotor symptoms, improving, but not resolved  PLAN: 1. Urinalysis with urine culture 2. Discontinue estradiol tablets 3. Begin Estratest 1.5/2.5 mg daily 4. Return in 6 weeks for follow-up  A total of 15 minutes were spent face-to-face with the patient during this encounter and over half of that time dealt with counseling and coordination of care.  Brayton Mars, MD  Note: This dictation was prepared with Dragon dictation along with smaller phrase technology. Any transcriptional errors that result from this process are unintentional.

## 2017-05-10 NOTE — Telephone Encounter (Signed)
Pt's insurance will not cover estra test. She would like an alternative. Pls advise.

## 2017-05-13 ENCOUNTER — Encounter: Payer: Self-pay | Admitting: Family Medicine

## 2017-05-13 ENCOUNTER — Ambulatory Visit (INDEPENDENT_AMBULATORY_CARE_PROVIDER_SITE_OTHER): Payer: BLUE CROSS/BLUE SHIELD | Admitting: Family Medicine

## 2017-05-13 DIAGNOSIS — Z5181 Encounter for therapeutic drug level monitoring: Secondary | ICD-10-CM | POA: Diagnosis not present

## 2017-05-13 DIAGNOSIS — N390 Urinary tract infection, site not specified: Secondary | ICD-10-CM | POA: Diagnosis not present

## 2017-05-13 DIAGNOSIS — N182 Chronic kidney disease, stage 2 (mild): Secondary | ICD-10-CM | POA: Diagnosis not present

## 2017-05-13 DIAGNOSIS — E785 Hyperlipidemia, unspecified: Secondary | ICD-10-CM | POA: Diagnosis not present

## 2017-05-13 DIAGNOSIS — Z713 Dietary counseling and surveillance: Secondary | ICD-10-CM | POA: Insufficient documentation

## 2017-05-13 DIAGNOSIS — I10 Essential (primary) hypertension: Secondary | ICD-10-CM

## 2017-05-13 DIAGNOSIS — R7303 Prediabetes: Secondary | ICD-10-CM | POA: Diagnosis not present

## 2017-05-13 LAB — POCT URINALYSIS DIPSTICK
Bilirubin, UA: NEGATIVE
Blood, UA: NEGATIVE
Glucose, UA: NEGATIVE
KETONES UA: NEGATIVE
LEUKOCYTES UA: NEGATIVE
Nitrite, UA: NEGATIVE
PH UA: 6 (ref 5.0–8.0)
PROTEIN UA: NEGATIVE
Spec Grav, UA: 1.025 (ref 1.010–1.025)
UROBILINOGEN UA: 0.2 U/dL

## 2017-05-13 LAB — LIPID PANEL
Cholesterol: 279 mg/dL — ABNORMAL HIGH (ref <200)
HDL: 47 mg/dL — AB (ref >50)
Total CHOL/HDL Ratio: 5.9 Ratio — ABNORMAL HIGH (ref <5.0)
Triglycerides: 427 mg/dL — ABNORMAL HIGH (ref <150)

## 2017-05-13 LAB — HEPATIC FUNCTION PANEL
ALBUMIN: 3.7 g/dL (ref 3.6–5.1)
ALT: 8 U/L (ref 6–29)
AST: 12 U/L (ref 10–35)
Alkaline Phosphatase: 70 U/L (ref 33–130)
BILIRUBIN TOTAL: 0.2 mg/dL (ref 0.2–1.2)
Bilirubin, Direct: 0 mg/dL (ref <=0.2)
Indirect Bilirubin: 0.2 mg/dL (ref 0.2–1.2)
Total Protein: 6.4 g/dL (ref 6.1–8.1)

## 2017-05-13 LAB — GLUCOSE, RANDOM: GLUCOSE: 91 mg/dL (ref 65–99)

## 2017-05-13 MED ORDER — AMLODIPINE BESYLATE 2.5 MG PO TABS: 2.5000 mg | ORAL_TABLET | Freq: Every day | ORAL | 3 refills | Status: DC

## 2017-05-13 NOTE — Addendum Note (Signed)
Addended by: Elouise Munroe on: 05/13/2017 11:50 AM   Modules accepted: Orders
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
## 2017-05-13 NOTE — Patient Instructions (Signed)
Add the amlodipine Monitor blood pressure and notify me if not under 140/90 Try the DASH guidelines We'll get labs and contact you with the results

## 2017-05-13 NOTE — Assessment & Plan Note (Addendum)
Continue ARB; Cr from April 07, 2017

## 2017-05-13 NOTE — Assessment & Plan Note (Signed)
Check A1c. 

## 2017-05-13 NOTE — Assessment & Plan Note (Signed)
Patient going to have this checked through GYN

## 2017-05-13 NOTE — Assessment & Plan Note (Signed)
Check today; limit saturated fats 

## 2017-05-13 NOTE — Assessment & Plan Note (Signed)
Check sgpt 

## 2017-05-13 NOTE — Assessment & Plan Note (Signed)
Add CCB; avoid beta-blockers; monitor BP at home and call me if not to goal; try DASH guidelines

## 2017-05-14 ENCOUNTER — Other Ambulatory Visit: Payer: Self-pay | Admitting: Family Medicine

## 2017-05-14 ENCOUNTER — Encounter: Payer: Self-pay | Admitting: Family Medicine

## 2017-05-14 DIAGNOSIS — E785 Hyperlipidemia, unspecified: Secondary | ICD-10-CM

## 2017-05-14 DIAGNOSIS — Z5181 Encounter for therapeutic drug level monitoring: Secondary | ICD-10-CM

## 2017-05-14 LAB — HEMOGLOBIN A1C
HEMOGLOBIN A1C: 5.8 % — AB (ref <5.7)
MEAN PLASMA GLUCOSE: 120 mg/dL

## 2017-05-14 MED ORDER — PITAVASTATIN CALCIUM 2 MG PO TABS: 1.0000 | ORAL_TABLET | Freq: Every day | ORAL | 1 refills | Status: DC

## 2017-05-14 NOTE — Progress Notes (Signed)
  Will try livalo; did not tolerate atorvastatin Lipids and sgpt orders entered

## 2017-05-14 NOTE — Assessment & Plan Note (Signed)
Check lipids after 6 weeks of livalo

## 2017-05-14 NOTE — Assessment & Plan Note (Signed)
Check liver enzymes after 6 weeks on livalo

## 2017-05-15 LAB — URINE CULTURE

## 2017-05-16 ENCOUNTER — Telehealth: Payer: Self-pay | Admitting: Family Medicine

## 2017-05-16 NOTE — Telephone Encounter (Signed)
Pharmacy updated.

## 2017-05-16 NOTE — Telephone Encounter (Signed)
Medicine already sent; glad she is willing to take it

## 2017-05-16 NOTE — Telephone Encounter (Signed)
FYI: Pt states that her prescriptions keep getting sent to the wrong pharmacy. Please update chart the only pharmacy that should be in her chart is the [facility]

## 2017-05-17 ENCOUNTER — Telehealth: Payer: Self-pay | Admitting: Obstetrics and Gynecology

## 2017-05-17 NOTE — Telephone Encounter (Signed)
Pt aware per vm- urine culture neg.

## 2017-05-17 NOTE — Telephone Encounter (Signed)
Patient called and stated that she missed a call from [employer] and that she would like for her to call her back. Please advise.

## 2017-05-31 ENCOUNTER — Other Ambulatory Visit: Payer: Self-pay | Admitting: Family Medicine

## 2017-05-31 DIAGNOSIS — K219 Gastro-esophageal reflux disease without esophagitis: Secondary | ICD-10-CM

## 2017-06-21 ENCOUNTER — Telehealth: Payer: Self-pay

## 2017-06-21 ENCOUNTER — Emergency Department: Payer: BLUE CROSS/BLUE SHIELD

## 2017-06-21 ENCOUNTER — Encounter: Payer: Self-pay | Admitting: *Deleted

## 2017-06-21 ENCOUNTER — Emergency Department
Admission: EM | Admit: 2017-06-21 | Discharge: 2017-06-21 | Disposition: A | Discharge status: Home / Self Care | Payer: BLUE CROSS/BLUE SHIELD | Attending: Emergency Medicine | Admitting: Emergency Medicine

## 2017-06-21 ENCOUNTER — Encounter: Payer: BLUE CROSS/BLUE SHIELD | Admitting: Obstetrics and Gynecology

## 2017-06-21 DIAGNOSIS — Z87891 Personal history of nicotine dependence: Secondary | ICD-10-CM | POA: Insufficient documentation

## 2017-06-21 DIAGNOSIS — R0789 Other chest pain: Secondary | ICD-10-CM | POA: Insufficient documentation

## 2017-06-21 DIAGNOSIS — R0602 Shortness of breath: Secondary | ICD-10-CM | POA: Diagnosis not present

## 2017-06-21 DIAGNOSIS — N189 Chronic kidney disease, unspecified: Secondary | ICD-10-CM | POA: Insufficient documentation

## 2017-06-21 DIAGNOSIS — Z79899 Other long term (current) drug therapy: Secondary | ICD-10-CM | POA: Diagnosis not present

## 2017-06-21 DIAGNOSIS — I129 Hypertensive chronic kidney disease with stage 1 through stage 4 chronic kidney disease, or unspecified chronic kidney disease: Secondary | ICD-10-CM | POA: Insufficient documentation

## 2017-06-21 DIAGNOSIS — R002 Palpitations: Secondary | ICD-10-CM | POA: Insufficient documentation

## 2017-06-21 DIAGNOSIS — J45909 Unspecified asthma, uncomplicated: Secondary | ICD-10-CM | POA: Insufficient documentation

## 2017-06-21 DIAGNOSIS — I10 Essential (primary) hypertension: Secondary | ICD-10-CM

## 2017-06-21 DIAGNOSIS — R079 Chest pain, unspecified: Secondary | ICD-10-CM

## 2017-06-21 LAB — BASIC METABOLIC PANEL
ANION GAP: 11 (ref 5–15)
BUN: 17 mg/dL (ref 6–20)
CALCIUM: 9.7 mg/dL (ref 8.9–10.3)
CO2: 27 mmol/L (ref 22–32)
CREATININE: 0.8 mg/dL (ref 0.44–1.00)
Chloride: 101 mmol/L (ref 101–111)
GFR calc Af Amer: >60 mL/min (ref >60)
GLUCOSE: 109 mg/dL — AB (ref 65–99)
Potassium: 3.7 mmol/L (ref 3.5–5.1)
Sodium: 139 mmol/L (ref 135–145)

## 2017-06-21 LAB — CBC
HCT: 42.9 % (ref 35.0–47.0)
HEMOGLOBIN: 14.5 g/dL (ref 12.0–16.0)
MCH: 27.9 pg (ref 26.0–34.0)
MCHC: 33.9 g/dL (ref 32.0–36.0)
MCV: 82.4 fL (ref 80.0–100.0)
PLATELETS: 420 10*3/uL (ref 150–440)
RBC: 5.21 MIL/uL — ABNORMAL HIGH (ref 3.80–5.20)
RDW: 14.3 % (ref 11.5–14.5)
WBC: 7.2 10*3/uL (ref 3.6–11.0)

## 2017-06-21 LAB — TROPONIN I
TROPONIN I: 0.03 ng/mL — AB (ref <0.03)
TROPONIN I: 0.03 ng/mL — AB (ref <0.03)

## 2017-06-21 LAB — FIBRIN DERIVATIVES D-DIMER (ARMC ONLY): Fibrin derivatives D-dimer (ARMC): 726.87 — ABNORMAL HIGH (ref 0.00–499.00)

## 2017-06-21 MED ORDER — AMLODIPINE BESYLATE 5 MG PO TABS
5.0000 mg | ORAL_TABLET | Freq: Once | ORAL | Status: AC
  Administered 2017-06-21: 5 mg via ORAL
  Filled 2017-06-21: qty 1

## 2017-06-21 MED ORDER — HYDROCHLOROTHIAZIDE 12.5 MG PO CAPS
12.5000 mg | ORAL_CAPSULE | Freq: Once | ORAL | Status: AC
  Administered 2017-06-21: 12.5 mg via ORAL
  Filled 2017-06-21: qty 1

## 2017-06-21 MED ORDER — IOPAMIDOL (ISOVUE-370) INJECTION 76%
75.0000 mL | Freq: Once | INTRAVENOUS | Status: AC | PRN
  Administered 2017-06-21: 75 mL via INTRAVENOUS

## 2017-06-21 NOTE — ED Triage Notes (Signed)
Pt report shaving had chest pains and feeling as though her heart is racing for the past 2 weeks. Pt reports hx of low K+ and irregular heart beat. Pt has toe cramps and reports feeling weak like  She had in the past.   Pt also reports having elevated blood pressure over the past week but has been taken off losartan and was told by PCP to come to ED because "they did not have a replacement medication at the pharmacy."

## 2017-06-21 NOTE — ED Notes (Signed)
Date and time results received: 08/21/181230 (use smartphrase ".now" to insert current time)  Test: troponin Critical Value: 0.03  Name of Provider Notified: Dr Jimmye Norman  Orders Received? Or Actions Taken?:

## 2017-06-21 NOTE — Telephone Encounter (Signed)
Pt having chest pains for a week now, BP  Readings 180 top number last couple of days  Last reading was 158/98. Pt states she has dizziness, visual blurriness, headaches , chest pains and irregular heart beats. Pt denies any other symptoms. Pt states she's taking valsartan and just now receive a letter regarding the recall. She also takes amlodipine but the pt states the lowest her BP has been 158/98. She was asking about other medication she can take but I mention to pt she will need too go to the ER ASAP and see if someone can take her I do not recommend for her to drive.  I suggest to the pt the first priority is to get her to the ER ASAP and I will send DR. lada a message.  Pt agreed to get someone to take her to the ER.

## 2017-06-21 NOTE — ED Notes (Signed)
Per MD will draw troponin at 420
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
## 2017-06-21 NOTE — Discharge Instructions (Signed)
Please return for worse chest pain shortness of breath or any other symptoms. Please follow-up with the cardiologist. He should call you tomorrow with an appointment. If he does not you can call him at the number included in this chart. Please take 5 mg of amlodipine instead of 2-1/2.

## 2017-06-22 ENCOUNTER — Telehealth: Payer: Self-pay | Admitting: Cardiology

## 2017-06-22 ENCOUNTER — Telehealth: Payer: Self-pay

## 2017-06-22 MED ORDER — LOSARTAN POTASSIUM 100 MG PO TABS: 100.0000 mg | ORAL_TABLET | Freq: Every day | ORAL | 2 refills | Status: DC

## 2017-06-22 NOTE — Telephone Encounter (Signed)
Spoke with patient and she was previously taking Valsartan 320 mg once daily and her pharmacy does not have any of this to refill due to recall. Let her know that I would route it to NP here today for his review and recommendation and will give her a call back. She has upcoming appointment with Ignacia Bayley NP 07/13/17. She had no further questions or concerns at this time.

## 2017-06-22 NOTE — Telephone Encounter (Signed)
Lmov for patient to call back Need to schedule appointment  Will try again at a later time

## 2017-06-22 NOTE — Telephone Encounter (Signed)
Ok to replace w/ losartan 100 mg PO daily.

## 2017-06-22 NOTE — Telephone Encounter (Signed)
Left voicemail message to call back  

## 2017-06-22 NOTE — Telephone Encounter (Signed)
(  See previous phone note)   Pt has been having high bp for the last three weeks she states.  Her normal is around 112/72  She is stating it has been around 180/102 She was advised that if her BP went stayed that high to go ED  She understood and is aware. She wanted to also mention up until yesterday she was taking her Valsartan She was not aware of the recall   Please advise

## 2017-06-22 NOTE — Telephone Encounter (Signed)
-----   Message from Debbora Dus sent at 06/22/2017 12:17 PM EDT ----- Kermit Balo afternoon!  Dr. Corinna Capra sent Dr. Curt Bears a message asking to get this patient in to see one of your doc's in [place]. She is having chest pain-pressure-tightness, some SOB.  Thanks, Mleissa

## 2017-06-22 NOTE — Telephone Encounter (Signed)
Spoke with patient and reviewed medication substitute Losartan 100 mg once daily and instructed her to monitor her blood pressures and keep a log of those readings. Instructed her to call if blood pressures are 140/80 or higher and she verbalized understanding with no further questions or needs at this time.

## 2017-06-23 NOTE — Telephone Encounter (Signed)
Patient states that her blood pressure this AM was 111/71 and so far is doing well on the medication. Reviewed with her to continue monitoring and please give [place] a call if it should be 140/80 or higher. She was appreciative for the follow up and had no further questions at this time.

## 2017-06-23 NOTE — Telephone Encounter (Signed)
Patient states that her blood pressure right now is 184/96 with heart rate of 100 and is having palpitations with left leg swelling. She states that she has had previous kidney failure with Metoprolol and is very sensitive with blood pressure medications. She states that she had swelling and it has gotten worse since they increased her amlodipine from 2.5 mg to 5 mg. She feels that the swelling has increased since the increase in amlodipine. She was just not sure if it was the losartan or not. Let her know that I would send this message for review. Advised her to eat low sodium diet, compression hose, and blood pressure log. Reviewed indications to seek attention in the ED and let her know that we would give her a call back with further recommendations.

## 2017-06-24 ENCOUNTER — Telehealth: Payer: Self-pay | Admitting: Nurse Practitioner

## 2017-06-24 MED ORDER — HYDRALAZINE HCL 10 MG PO TABS: 10.0000 mg | ORAL_TABLET | Freq: Three times a day (TID) | ORAL | 3 refills | Status: DC

## 2017-06-24 NOTE — Telephone Encounter (Signed)
Patient states that she was directed to go seek ED evaluation and then they in turn advised her to seek cardiology advice. She reports that she is having chest pain and increased palpitations along with the elevated blood pressures and just feels as if she is just being passed around. She reports her blood pressure 1 & 1/2 hour after AM meds was 178/82. Let her know that I can certainly understand her frustrations and that I would review this with someone for further direction. She was appreciative for the call and had no further questions or concerns at this time.

## 2017-06-24 NOTE — Telephone Encounter (Signed)
Reviewed recommendations with patient and discussed medication changes. She verbalized understanding of all instructions and read back instructions of Amlodipine 2.5 mg once daily, Hydralazine 10 mg three times a day, and Losartan 100 mg once daily. Instructed her to continue low sodium diet, compression socks, and monitoring her blood pressures. She verbalized understanding of our conversation, agreement with plan, and had no further questions.

## 2017-06-24 NOTE — Telephone Encounter (Signed)
I agree that titration of amlodipine may be contributing to lower ext swelling.  She may back down to 2.5 mg daily on amlodipine and we can add hydralazine 10 mg tid.  She may continue lasix and losartan @ current doses.  She should continue to follow her bp and notify us if pressures remain in the 140-150 range, at which point we could titrate hydralazine further.  If she is having chest pain, she will need to be evaluated in the ED.  Recent ED eval was negative for MI and PE, which is reassuring.

## 2017-06-28 ENCOUNTER — Telehealth: Payer: Self-pay | Admitting: Nurse Practitioner

## 2017-06-28 NOTE — Telephone Encounter (Signed)
Patient called and states that she had a rough weekend. She was recently placed on losartan (replacing Valsartan) and was also on amlodipine once daily. She reports that her swelling got much worse, eyes swollen, having trouble breathing along with hands and feet swelling. She stopped the losartan and amlodipine and the swelling has now gone down. She was also not urinating while on those medications. So she stopped those meds, swelling has improved, and urinating has improved as well. She increased her hydralazine to 20 mg three times daily and now her blood pressures have been much better and no noted side effects at this time. Blood pressures have been running between 112/78 to 122/80 but she still has chest pain and palpitations. She did confirm her upcoming appointment with Ignacia Bayley NP and understands indications to seek evaluation for chest pain, palpitations, and shortness of breath. Will route to Ignacia Bayley NP to update on the changes and for his review.

## 2017-06-28 NOTE — Telephone Encounter (Signed)
Pt did not want to leave a message  Just said it was about her medication  Please call back She asked for Pam to call back

## 2017-07-07 ENCOUNTER — Encounter: Payer: Self-pay | Admitting: Family Medicine

## 2017-07-07 ENCOUNTER — Ambulatory Visit (INDEPENDENT_AMBULATORY_CARE_PROVIDER_SITE_OTHER): Payer: BLUE CROSS/BLUE SHIELD | Admitting: Family Medicine

## 2017-07-07 ENCOUNTER — Telehealth: Payer: Self-pay | Admitting: Family Medicine

## 2017-07-07 VITALS — BP 126/78 | HR 96 | Temp 97.8°F | Resp 16 | Ht 65.0 in | Wt 199.9 lb

## 2017-07-07 DIAGNOSIS — M159 Polyosteoarthritis, unspecified: Secondary | ICD-10-CM | POA: Diagnosis not present

## 2017-07-07 DIAGNOSIS — B373 Candidiasis of vulva and vagina: Secondary | ICD-10-CM

## 2017-07-07 DIAGNOSIS — B3731 Acute candidiasis of vulva and vagina: Secondary | ICD-10-CM

## 2017-07-07 DIAGNOSIS — R195 Other fecal abnormalities: Secondary | ICD-10-CM | POA: Diagnosis not present

## 2017-07-07 DIAGNOSIS — R1011 Right upper quadrant pain: Secondary | ICD-10-CM

## 2017-07-07 LAB — COMPLETE METABOLIC PANEL WITH GFR
AG Ratio: 1.5 (calc) (ref 1.0–2.5)
ALT: 18 U/L (ref 6–29)
AST: 22 U/L (ref 10–35)
Albumin: 4.6 g/dL (ref 3.6–5.1)
Alkaline phosphatase (APISO): 75 U/L (ref 33–130)
BUN: 20 mg/dL (ref 7–25)
CO2: 30 mmol/L (ref 20–32)
Calcium: 10.5 mg/dL — ABNORMAL HIGH (ref 8.6–10.4)
Chloride: 96 mmol/L — ABNORMAL LOW (ref 98–110)
Creat: 0.77 mg/dL (ref 0.50–1.05)
GFR, Est African American: 104 mL/min/{1.73_m2} (ref >=60)
GFR, Est Non African American: 89 mL/min/{1.73_m2} (ref >=60)
Globulin: 3 g/dL (calc) (ref 1.9–3.7)
Glucose, Bld: 103 mg/dL (ref 65–139)
Potassium: 4.5 mmol/L (ref 3.5–5.3)
Sodium: 134 mmol/L — ABNORMAL LOW (ref 135–146)
Total Bilirubin: 0.3 mg/dL (ref 0.2–1.2)
Total Protein: 7.6 g/dL (ref 6.1–8.1)

## 2017-07-07 LAB — CBC
HCT: 43.1 % (ref 35.0–45.0)
Hemoglobin: 14.2 g/dL (ref 11.7–15.5)
MCH: 27.7 pg (ref 27.0–33.0)
MCHC: 32.9 g/dL (ref 32.0–36.0)
MCV: 84.2 fL (ref 80.0–100.0)
MPV: 9.2 fL (ref 7.5–12.5)
Platelets: 374 10*3/uL (ref 140–400)
RBC: 5.12 10*6/uL — ABNORMAL HIGH (ref 3.80–5.10)
RDW: 13.1 % (ref 11.0–15.0)
WBC: 5 10*3/uL (ref 3.8–10.8)

## 2017-07-07 LAB — LIPASE: Lipase: 44 U/L (ref 7–60)

## 2017-07-07 MED ORDER — FLUCONAZOLE 150 MG PO TABS: 150.0000 mg | ORAL_TABLET | Freq: Once | ORAL | 0 refills | Status: AC

## 2017-07-07 MED ORDER — DICLOFENAC SODIUM 1 % TD GEL: 4.0000 g | Freq: Two times a day (BID) | TRANSDERMAL | 0 refills | Status: DC | PRN

## 2017-07-07 NOTE — Telephone Encounter (Signed)
PT ASKING FOR RESULTS OF LABS. CASSANDRA BRINGING THEM TO YOU OR HELEN,. ALSO WANTS TO KNOW ABOUT THE ORDER FOR U/S OF GALLBLADDER. SAYS THAT THE DR USUALLY JUST PLACES THE ORDER IN AND SHE GOES ON OVER BUT NURSE TOLD HER THAT THEY WOULD CALL HER.

## 2017-07-07 NOTE — Progress Notes (Addendum)
Name: Laura [name]   MRN: [id number]    DOB: [birth year]   Date:07/07/2017       Progress Note  Subjective  Chief Complaint  Chief Complaint  Patient presents with  . Abdominal Pain    RUQ  . Vaginitis    HPI  Patient presents with multiple complaints:  Vaginal Candidiasis: Was diagnosed with UTI 04/20/2017 and treated with Macrobid by Dr. Enzo Bi - completed course, but has been dealing with a progressively worsening vaginal yeast infection since then.  She is very uncomfortable today - describes vaginal itching.  Denies vaginal pain, pelvic pain, or low abdominal pain. Clumpy white discharge. Also has surrounding rash in vaginal area.  Does not want STD check today - is monogamous with husband.  RUQ Pain: Has been ongoing for several months.  Pain lasts for minutes to hours; she is unsure if it occurs after eating but thinks it may be related.  Stool has been tan colored like "molded clay" - does go back to normal; has back and forth diarrhea and constipation.  No dark/tarry stools and no bright red blood. Endorses nausea and occasional vomiting. She does note that urine has been a little darker than normal lately - drinks plenty of water but takes HCTZ and supplements with Potassium.  Does have mild BLE edema.  Chest Pain/ER Visit: Patient went to the ER on 06/21/2017 for chest pain and palpitations, had mildly elevated Troponin, and she was discharged home.  Has appointment with Cardiology on 07/13/2017 for a follow up.  She has had some intermittent palpitations and chest pains since this visit and feels fatigued - does note that she has fibromyalgia and questions if this is contributing to her fatigue and chest pain. CT angio negative for clot, Chest Xray negative for PNA or other abnormality.  We will defer to cardiology for further work up.  Thumb Pain: Has bilateral thumb pain related to arthritis - uses voltaren gel PRN and has been having a flare the last few weeks. She requests a  refill of this medication today. She has history of osteoarthritis of multiple joints.  Patient Active Problem List   Diagnosis Date Noted  . Medication monitoring encounter 05/13/2017  . 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 Recurrent UTI 05/10/2017  . Status post hysterectomy 03/08/2017  . Complex ovarian cyst, left 03/08/2017  . Barrett's esophagus 11/02/2016  . Difficulty with speech 10/18/2016  . Muscle spasm 10/18/2016  . Headache 10/18/2016  . Memory impairment 10/18/2016  . Chronic pain syndrome 10/12/2016  . Chronic low back pain (Location of Tertiary source of pain) (Bilateral) (L>R) 10/12/2016  . Lumbar facet joint syndrome ([place]) 10/12/2016  . Chronic sacroiliac joint pain 10/12/2016  . SOB (shortness of breath) on exertion 10/12/2016  . Chronic shoulder pain (Location of Primary Source of Pain) (Bilateral) (R>L) 10/12/2016  . Chronic neck pain (Location of Secondary source of pain) (Bilateral) (R>L) 10/12/2016  . Chronic upper back pain (Bilateral) (L>R) 10/12/2016  . Chronic hand pain (Bilateral) (R>L) 10/12/2016  . Chronic hand pain (Right) 10/12/2016  . Chronic hand pain (Left) 10/12/2016  . Chronic knee pain (Right) 10/12/2016  . Osteoarthritis of knee (Right) 10/12/2016  . Asthma without status asthmaticus 10/11/2016  . CKD (chronic kidney disease) 10/11/2016  . Thyroid disease 10/11/2016  . Long term prescription opiate use 10/11/2016  . Opiate use 10/11/2016  . Vitamin B12 deficiency 07/26/2016  . Anxiety 07/06/2016  . Depression 07/06/2016  . Generalized osteoarthritis of hand 07/06/2016  .  Raynaud's disease without gangrene 07/06/2016  . Raynaud disease 05/06/2016  . Breast cancer screening 04/14/2016  . Pterygium of left eye 03/24/2016  . Musculoskeletal pain 03/02/2016  . Fibromyalgia 02/13/2016  . Weakness 12/30/2015  . Shellfish allergy 12/30/2015  . Prediabetes 12/23/2015  . Heart palpitations 12/16/2015  . Heart murmur 12/16/2015  .  Hyperlipidemia LDL goal <100 11/19/2015  . GERD without esophagitis 06/13/2015  . Bilateral lower extremity edema 06/06/2015  . Hot flashes due to surgical menopause 06/06/2015  . Chronic pain of multiple joints 06/06/2015  . Osteoarthrosis, generalized, multiple joints 06/06/2015  . Vaginal enterocele   . Urinary retention with incomplete bladder emptying   . Vaginal dryness, menopausal   . Obesity, Class I, BMI 30.0-34.9 (see actual BMI)   . Rectocele   . Cystocele   . Anxiety and depression   . Acquired hypothyroidism 12/03/2014  . Hypertension goal BP (blood pressure) < 140/90 12/03/2014  . Asthma, mild intermittent 12/03/2014  . Vitamin D deficiency 12/03/2014  . Mild intermittent asthma without complication 82/42/3536  . Insomnia 08/31/2013    Social History  Substance Use Topics  . Smoking status: Former Smoker    Quit date: 04/08/1995  . Smokeless tobacco: Never Used  . Alcohol use 0.6 oz/week    1 Glasses of wine per week     Comment: rare; once every 6 months     Current Outpatient Prescriptions:  .  albuterol (PROVENTIL HFA;VENTOLIN HFA) 108 (90 Base) MCG/ACT inhaler, Inhale 2 puffs into the lungs every 4 (four) hours as needed for wheezing or shortness of breath., Disp: 1 Inhaler, Rfl: 1 .  ALPRAZolam (XANAX) 1 MG tablet, Take 1 mg by mouth 4 (four) times daily. , Disp: , Rfl:  .  amLODipine (NORVASC) 2.5 MG tablet, Take 1 tablet (2.5 mg total) by mouth daily., Disp: 90 tablet, Rfl: 3 .  amphetamine-dextroamphetamine (ADDERALL) 30 MG tablet, TAKE 1 TABLET BY MOUTH EVERY MORNING AND TAKE 1 TABLET AT NOON AND TAKE 1 TABLET AT 4PM, Disp: , Rfl: 0 .  Cetirizine HCl 10 MG CAPS, Take 10 mg by mouth daily., Disp: , Rfl:  .  clobetasol cream (TEMOVATE) 1.44 %, Apply 1 application topically 2 (two) times daily., Disp: 30 g, Rfl: 0 .  diclofenac sodium (VOLTAREN) 1 % GEL, Apply 1 application topically 2 (two) times daily as needed. Reported on 02/19/2016, Disp: , Rfl:  .   DULoxetine (CYMBALTA) 60 MG capsule, Take 120 mg by mouth daily., Disp: , Rfl:  .  EPINEPHRINE 0.3 mg/0.3 mL IJ SOAJ injection, Inject 0.3 mLs (0.3 mg total) into the muscle once., Disp: 2 Device, Rfl: 1 .  estradiol (ESTRACE) 1 MG tablet, Take 1 tablet (1 mg total) by mouth daily. (Patient taking differently: Take 1 mg by mouth daily. ), Disp: 30 tablet, Rfl: 12 .  furosemide (LASIX) 20 MG tablet, Take 1 tablet (20 mg total) by mouth daily. (Patient taking differently: Take 20 mg by mouth 2 (two) times daily. ), Disp: 90 tablet, Rfl: 1 .  glucose blood ([employer] TEST STRIPS) test strip, , Disp: , Rfl:  .  hydrALAZINE (APRESOLINE) 10 MG tablet, Take 1 tablet (10 mg total) by mouth 3 (three) times daily., Disp: 90 tablet, Rfl: 3 .  levothyroxine (SYNTHROID, LEVOTHROID) 125 MCG tablet, Take 1 tablet (125 mcg total) by mouth daily before breakfast., Disp: 90 tablet, Rfl: 3 .  pantoprazole (PROTONIX) 40 MG tablet, TAKE 1 TABLET BY MOUTH DAILY, Disp: 30 tablet, Rfl: 5 .  Pitavastatin Calcium (LIVALO) 2 MG TABS, Take 1 tablet (2 mg total) by mouth at bedtime. For cholesterol, Disp: 30 tablet, Rfl: 1 .  potassium chloride (K-DUR,KLOR-CON) 10 MEQ tablet, Two tablets by mouth in the morning, one tablet in the afternoon (Patient taking differently: Take 20 mEq by mouth daily. Two tablets by mouth in the morning, one tablet in the afternoon), Disp: 90 tablet, Rfl: 1 .  zolpidem (AMBIEN) 10 MG tablet, Take 10 mg by mouth at bedtime., Disp: , Rfl:  .  losartan (COZAAR) 100 MG tablet, Take 1 tablet (100 mg total) by mouth daily. (Patient not taking: Reported on 07/07/2017), Disp: 30 tablet, Rfl: 2 .  magnesium oxide (MAG-OX) 400 MG tablet, Take 1 tablet (400 mg total) by mouth 2 (two) times daily. (Patient not taking: Reported on 07/07/2017), Disp: 60 tablet, Rfl: 2 .  polyethylene glycol powder (GLYCOLAX/MIRALAX) powder, Take 17 g by mouth daily. (Patient not taking: Reported on 06/21/2017), Disp: 3350 g, Rfl: 1 .   tiZANidine (ZANAFLEX) 4 MG tablet, , Disp: , Rfl: 0  Allergies  Allergen Reactions  . Meperidine Hives  . Shellfish Allergy Shortness Of Breath and Swelling  . Acebutolol Swelling  . Codeine Hives and Nausea And Vomiting  . Hydrocodone Other (See Comments)    Keeps patient awake.  . Metoprolol Swelling  . Mirtazapine Swelling  . Oxycodone Itching  . Sectral [Acebutolol Hcl] Swelling  . Tramadol     Unable to sleep, makes her itch    ROS  Ten systems reviewed and is negative except as mentioned in HPI  Objective  Vitals:   07/07/17 0946  BP: 126/78  Pulse: (!) 110  Resp: 16  Temp: 97.8 F (36.6 C)  TempSrc: Oral  SpO2: 95%  Weight: 199 lb 14.4 oz (90.7 kg)  Height: 5' 5"  (1.651 m)   Body mass index is 33.27 kg/m.  Nursing Note and Vital Signs reviewed.  Physical Exam  Constitutional: Patient appears well-developed and well-nourished. No distress.  HEENT: head atraumatic, normocephalic Cardiovascular: Normal rate, regular rhythm, S1/S2 present.  No murmur or rub heard. No BLE edema. Pulmonary/Chest: Effort normal and breath sounds clear. No respiratory distress or retractions. Abdominal: Soft abdomen. RUQ tenderness present; bowel sounds present x4 quadrants.  No CVA Tenderness. Psychiatric: Patient has a normal mood and affect. behavior is normal. Judgment and thought content normal. Skin: No rash noted to bilateral thighs or vaginal area. FEMALE GENITALIA:  External genitalia normal; mild vulvar irritation. External urethra normal MSK: Bilateral thumbs are non-tender to palpation, no swelling or erythema. Otherwise strength is equal bilaterally and good AROM of extremities.  Recent Results (from the past 2160 hour(s))  Glucose, capillary     Status: Abnormal   Collection Time: 04/11/17  8:23 AM  Result Value Ref Range   Glucose-Capillary 110 (H) 65 - 99 mg/dL  ABO/Rh     Status: None   Collection Time: 04/11/17  8:43 AM  Result Value Ref Range    ABO/RH(D) A POS   Surgical pathology     Status: None   Collection Time: 04/11/17 10:46 AM  Result Value Ref Range   SURGICAL PATHOLOGY      Surgical Pathology CASE: [id number] PATIENT: Denecia ATKINS Surgical Pathology Report     SPECIMEN SUBMITTED: A. Ovary, left  CLINICAL HISTORY: None provided  PRE-OPERATIVE DIAGNOSIS: Complex ovarian cyst, LLQ pain  POST-OPERATIVE DIAGNOSIS: Same as pre-op     DIAGNOSIS: A. LEFT OVARY; LEFT OOPHORECTOMY: - SIMPLE CYST AND AGE-RELATED CHANGE.  GROSS DESCRIPTION:  A. Labeled: left ovary   Integrity: shaggy purple tan with adherent fibrous and fatty tissue, grossly intact  Size of specimen:           Ovary: 3.5 x 2.2 x 1.1 cm           Fallopian tube: no fallopian tube grossly identified  Weight of specimen: 7 grams  Ovarian surface: shaggy purple tan with adherent fibrous and fatty tissue  Description of mass(es): focal unilocular cyst  Cyst contents: not determined  Block summary: 1-4-representative sections    Final Diagnosis performed by Delorse Lek, MD.  Electronically signed 04/12/2017 12:22:26PM    The electronic signature i ndicates that the named Attending Pathologist has evaluated the specimen  Technical component performed at [facility], [address], [place], [place] [address] Lab: [phone] Dir: Darrick Penna. Evette Doffing, MD  Professional component performed at [facility], [facility], [place], [place], [place] [address] Lab: [phone] Dir: Dellia Nims. Rubinas, MD    Urine Culture     Status: Abnormal   Collection Time: 04/20/17 12:11 PM  Result Value Ref Range   Urine Culture, Routine Final report (A)    Organism ID, Bacteria Escherichia coli (A)     Comment: Greater than 100,000 colony forming units per mL Cefazolin <=4 ug/mL Cefazolin with an MIC <=16 predicts susceptibility to the oral agents cefaclor, cefdinir, cefpodoxime, cefprozil, cefuroxime, cephalexin, and  loracarbef when used for therapy of uncomplicated urinary tract infections due to E. coli, Klebsiella pneumoniae, and Proteus mirabilis.    Antimicrobial Susceptibility Comment     Comment:       ** S = Susceptible; I = Intermediate; R = Resistant **                    P = Positive; N = Negative             MICS are expressed in micrograms per mL    Antibiotic                 RSLT#1    RSLT#2    RSLT#3    RSLT#4 Amoxicillin/Clavulanic Acid    S Ampicillin                     S Cefepime                       S Ceftriaxone                    S Cefuroxime                     S Ciprofloxacin                  S Ertapenem                      S Gentamicin                     S Imipenem                       S Levofloxacin                   S Meropenem                      S Nitrofurantoin  S Piperacillin/Tazobactam        S Tetracycline                   S Tobramycin                     S Trimethoprim/Sulfa             S   POCT urinalysis dipstick     Status: Abnormal   Collection Time: 04/20/17  1:07 PM  Result Value Ref Range   Color, UA yellow    Clarity, UA cloudy    Glucose, UA neg    Bilirubin, UA neg    Ketones, UA neg    Spec Grav, UA 1.010 1.010 - 1.025   Blood, UA neg    pH, UA 6.0 5.0 - 8.0   Protein, UA neg    Urobilinogen, UA 0.2 0.2 or 1.0 E.U./dL   Nitrite, UA neg    Leukocytes, UA Trace (A) Negative  CBC with Differential/Platelet     Status: Abnormal   Collection Time: 04/20/17  2:42 PM  Result Value Ref Range   WBC 9.4 3.4 - 10.8 x10E3/uL   RBC 4.66 3.77 - 5.28 x10E6/uL   Hemoglobin 13.0 11.1 - 15.9 g/dL   Hematocrit 39.6 34.0 - 46.6 %   MCV 85 79 - 97 fL   MCH 27.9 26.6 - 33.0 pg   MCHC 32.8 31.5 - 35.7 g/dL   RDW 14.8 12.3 - 15.4 %   Platelets 416 (H) 150 - 379 x10E3/uL   Neutrophils 62 Not Estab. %   Lymphs 26 Not Estab. %   Monocytes 9 Not Estab. %   Eos 3 Not Estab. %   Basos 0 Not Estab. %   Neutrophils Absolute 5.7 1.4 - 7.0  x10E3/uL   Lymphocytes Absolute 2.5 0.7 - 3.1 x10E3/uL   Monocytes Absolute 0.9 0.1 - 0.9 x10E3/uL   EOS (ABSOLUTE) 0.3 0.0 - 0.4 x10E3/uL   Basophils Absolute 0.0 0.0 - 0.2 x10E3/uL   Immature Granulocytes 0 Not Estab. %   Immature Grans (Abs) 0.0 0.0 - 0.1 x10E3/uL  Lipid panel     Status: Abnormal   Collection Time: 05/13/17  8:38 AM  Result Value Ref Range   Cholesterol 279 (H) <200 mg/dL   Triglycerides 427 (H) <150 mg/dL   HDL 47 (L) >50 mg/dL   Total CHOL/HDL Ratio 5.9 (H) <5.0 Ratio   VLDL NOT CALC <30 mg/dL    Comment:   Not calculated due to Triglyceride >400. Suggest ordering Direct LDL (Unit Code: [phone]).    LDL Cholesterol NOT CALC <100 mg/dL    Comment:   Not calculated due to Triglyceride >400. Suggest ordering Direct LDL (Unit Code: [phone]).   Hemoglobin A1c     Status: Abnormal   Collection Time: 05/13/17  8:38 AM  Result Value Ref Range   Hgb A1c MFr Bld 5.8 (H) <5.7 %    Comment:   For someone without known diabetes, a hemoglobin A1c value between 5.7% and 6.4% is consistent with prediabetes and should be confirmed with a follow-up test.   For someone with known diabetes, a value <7% indicates that their diabetes is well controlled. A1c targets should be individualized based on duration of diabetes, age, co-morbid conditions and other considerations.   This assay result is consistent with an increased risk of diabetes.   Currently, no consensus exists regarding use of hemoglobin A1c for diagnosis of diabetes in children.  Mean Plasma Glucose 120 mg/dL  Glucose     Status: None   Collection Time: 05/13/17  8:38 AM  Result Value Ref Range   Glucose, Bld 91 65 - 99 mg/dL  Hepatic function panel     Status: None   Collection Time: 05/13/17  8:38 AM  Result Value Ref Range   Total Bilirubin 0.2 0.2 - 1.2 mg/dL   Bilirubin, Direct 0.0 <=0.2 mg/dL   Indirect Bilirubin 0.2 0.2 - 1.2 mg/dL   Alkaline Phosphatase 70 33 - 130 U/L   AST 12 10 - 35 U/L    ALT 8 6 - 29 U/L   Total Protein 6.4 6.1 - 8.1 g/dL   Albumin 3.7 3.6 - 5.1 g/dL  POCT urinalysis dipstick     Status: None   Collection Time: 05/13/17 11:49 AM  Result Value Ref Range   Color, UA yellow    Clarity, UA clear    Glucose, UA neg    Bilirubin, UA neg    Ketones, UA neg    Spec Grav, UA 1.025 1.010 - 1.025   Blood, UA neg    pH, UA 6.0 5.0 - 8.0   Protein, UA neg    Urobilinogen, UA 0.2 0.2 or 1.0 E.U./dL   Nitrite, UA neg    Leukocytes, UA Negative Negative  Urine Culture     Status: None   Collection Time: 05/13/17  2:40 PM  Result Value Ref Range   Urine Culture, Routine Final report    Organism ID, Bacteria Comment     Comment: Mixed urogenital flora 10,000-25,000 colony forming units per mL   Basic metabolic panel     Status: Abnormal   Collection Time: 06/21/17 11:36 AM  Result Value Ref Range   Sodium 139 135 - 145 mmol/L   Potassium 3.7 3.5 - 5.1 mmol/L   Chloride 101 101 - 111 mmol/L   CO2 27 22 - 32 mmol/L   Glucose, Bld 109 (H) 65 - 99 mg/dL   BUN 17 6 - 20 mg/dL   Creatinine, Ser 0.80 0.44 - 1.00 mg/dL   Calcium 9.7 8.9 - 10.3 mg/dL   GFR calc non Af Amer >60 >60 mL/min   GFR calc Af Amer >60 >60 mL/min    Comment: (NOTE) The eGFR has been calculated using the CKD EPI equation. This calculation has not been validated in all clinical situations. eGFR's persistently <60 mL/min signify possible Chronic Kidney Disease.    Anion gap 11 5 - 15  CBC     Status: Abnormal   Collection Time: 06/21/17 11:36 AM  Result Value Ref Range   WBC 7.2 3.6 - 11.0 K/uL   RBC 5.21 (H) 3.80 - 5.20 MIL/uL   Hemoglobin 14.5 12.0 - 16.0 g/dL   HCT 42.9 35.0 - 47.0 %   MCV 82.4 80.0 - 100.0 fL   MCH 27.9 26.0 - 34.0 pg   MCHC 33.9 32.0 - 36.0 g/dL   RDW 14.3 11.5 - 14.5 %   Platelets 420 150 - 440 K/uL  Troponin I     Status: Abnormal   Collection Time: 06/21/17 11:36 AM  Result Value Ref Range   Troponin I 0.03 (HH) <0.03 ng/mL    Comment: CRITICAL  RESULT CALLED TO, READ BACK BY AND VERIFIED WITH JILL COTRONE@1224  ON 06/21/17 BY HKP   Troponin I     Status: Abnormal   Collection Time: 06/21/17  2:18 PM  Result Value Ref Range   Troponin I  0.03 (HH) <0.03 ng/mL    Comment: CRITICAL VALUE NOTED. VALUE IS CONSISTENT WITH PREVIOUSLY REPORTED/CALLED VALUE JJB  Fibrin derivatives D-Dimer ([place] only)     Status: Abnormal   Collection Time: 06/21/17  3:50 PM  Result Value Ref Range   Fibrin derivatives D-dimer ([facility]) 726.87 (H) 0.00 - 499.00    Comment: (NOTE) <> Exclusion of Venous Thromboembolism (VTE) - OUTPATIENT ONLY   (Emergency Department or [place])   0-499 ng/ml (FEU): With a low to intermediate pretest probability                      for VTE this test result excludes the diagnosis                      of VTE.   >499 ng/ml (FEU) : VTE not excluded; additional work up for VTE is                      required. <> Testing on Inpatients and Evaluation of Disseminated Intravascular   Coagulation ([facility]IC) Reference Range:   0-499 ng/ml (FEU)   Troponin I     Status: None   Collection Time: 06/21/17  4:30 PM  Result Value Ref Range   Troponin I <0.03 <0.03 ng/mL     Assessment & Plan  1. RUQ abdominal pain - US Abdomen Limited RUQ; Future - CBC - COMPLETE METABOLIC PANEL WITH GFR - Lipase  2. Vaginal candidiasis - fluconazole (DIFLUCAN) 150 MG tablet; Take 1 tablet (150 mg total) by mouth once. If not improving after 72 hours, take second dose.  Dispense: 2 tablet; Refill: 0  3. Clay-colored stools - US Abdomen Limited RUQ; Future - CBC - COMPLETE METABOLIC PANEL WITH GFR - Lipase  4. Osteoarthrosis, generalized, multiple joints - diclofenac sodium (VOLTAREN) 1 % GEL; Apply 4 g topically 2 (two) times daily as needed. Reported on 02/19/2016  Dispense: 100 g; Refill: 0  -Red flags and when to present for emergency care or RTC including fever >101.85F, chest pain/palpitations that do not go away, shortness of breath, severe  abdominal pain, vomiting that will not stop, new/worsening/un-resolving symptoms, reviewed with patient at time of visit. Follow up and care instructions discussed and provided in AVS.  I have reviewed this encounter including the documentation in this note and/or discussed this patient with the Johney Maine, FNP, NP-C. I am certifying that I agree with the content of this note as supervising physician.  Steele Sizer, MD [facility] 07/09/2017, 4:36 PM

## 2017-07-07 NOTE — Telephone Encounter (Signed)
LMOM for patient to call back regarding results.

## 2017-07-07 NOTE — Patient Instructions (Addendum)
Vaginal Yeast infection, Adult Vaginal yeast infection is a condition that causes soreness, swelling, and redness (inflammation) of the vagina. It also causes vaginal discharge. This is a common condition. Some women get this infection frequently. What are the causes? This condition is caused by a change in the normal balance of the yeast (candida) and bacteria that live in the vagina. This change causes an overgrowth of yeast, which causes the inflammation. What increases the risk? This condition is more likely to develop in:  Women who take antibiotic medicines.  Women who have diabetes.  Women who take birth control pills.  Women who are pregnant.  Women who douche often.  Women who have a weak defense (immune) system.  Women who have been taking steroid medicines for a long time.  Women who frequently wear tight clothing.  What are the signs or symptoms? Symptoms of this condition include:  White, thick vaginal discharge.  Swelling, itching, redness, and irritation of the vagina. The lips of the vagina (vulva) may be affected as well.  Pain or a burning feeling while urinating.  Pain during sex.  How is this diagnosed? This condition is diagnosed with a medical history and physical exam. This will include a pelvic exam. Your health care provider will examine a sample of your vaginal discharge under a microscope. Your health care provider may send this sample for testing to confirm the diagnosis. How is this treated? This condition is treated with medicine. Medicines may be over-the-counter or prescription. You may be told to use one or more of the following:  Medicine that is taken orally.  Medicine that is applied as a cream.  Medicine that is inserted directly into the vagina (suppository).  Follow these instructions at home:  Take or apply over-the-counter and prescription medicines only as told by your health care provider.  Do not have sex until your health  care provider has approved. Tell your sex partner that you have a yeast infection. That person should go to his or her health care provider if he or she develops symptoms.  Do not wear tight clothes, such as pantyhose or tight pants.  Avoid using tampons until your health care provider approves.  Eat more yogurt. This may help to keep your yeast infection from returning.  Try taking a sitz bath to help with discomfort. This is a warm water bath that is taken while you are sitting down. The water should only come up to your hips and should cover your buttocks. Do this 3-4 times per day or as told by your health care provider.  Do not douche.  Wear breathable, cotton underwear.  If you have diabetes, keep your blood sugar levels under control. Contact a health care provider if:  You have a fever.  Your symptoms go away and then return.  Your symptoms do not get better with treatment.  Your symptoms get worse.  You have new symptoms.  You develop blisters in or around your vagina.  You have blood coming from your vagina and it is not your menstrual period.  You develop pain in your abdomen. This information is not intended to replace advice given to you by your health care provider. Make sure you discuss any questions you have with your health care provider. Document Released: 07/28/2005 Document Revised: 03/31/2016 Document Reviewed: 04/21/2015 Elsevier Interactive Patient Education  2018 [employer].  Nonspecific Chest Pain Chest pain can be caused by many different conditions. There is a chance that your pain could  be related to something serious, such as a heart attack or a blood clot in your lungs. Chest pain can also be caused by conditions that are not life-threatening. If you have chest pain, it is very important to follow up with your doctor. Follow these instructions at home: Medicines  If you were prescribed an antibiotic medicine, take it as told by your doctor. Do  not stop taking the antibiotic even if you start to feel better.  Take over-the-counter and prescription medicines only as told by your doctor. Lifestyle  Do not use any products that contain nicotine or tobacco, such as cigarettes and e-cigarettes. If you need help quitting, ask your doctor.  Do not drink alcohol.  Make lifestyle changes as told by your doctor. These may include: ? Getting regular exercise. Ask your doctor for some activities that are safe for you. ? Eating a heart-healthy diet. A diet specialist (dietitian) can help you to learn healthy eating options. ? Staying at a healthy weight. ? Managing diabetes, if needed. ? Lowering your stress, as with deep breathing or spending time in nature. General instructions  Avoid any activities that make you feel chest pain.  If your chest pain is because of heartburn: ? Raise (elevate) the head of your bed about 6 inches (15 cm). You can do this by putting blocks under the bed legs at the head of the bed. ? Do not sleep with extra pillows under your head. That does not help heartburn.  Keep all follow-up visits as told by your doctor. This is important. This includes any further testing if your chest pain does not go away. Contact a doctor if:  Your chest pain does not go away.  You have a rash with blisters on your chest.  You have a fever.  You have chills. Get help right away if:  Your chest pain is worse.  You have a cough that gets worse, or you cough up blood.  You have very bad (severe) pain in your belly (abdomen).  You are very weak.  You pass out (faint).  You have either of these for no clear reason: ? Sudden chest discomfort. ? Sudden discomfort in your arms, back, neck, or jaw.  You have shortness of breath at any time.  You suddenly start to sweat, or your skin gets clammy.  You feel sick to your stomach (nauseous).  You throw up (vomit).  You suddenly feel light-headed or dizzy.  Your  heart starts to beat fast, or it feels like it is skipping beats. These symptoms may be an emergency. Do not wait to see if the symptoms will go away. Get medical help right away. Call your local emergency services (911 in the [place] Do not drive yourself to the hospital. This information is not intended to replace advice given to you by your health care provider. Make sure you discuss any questions you have with your health care provider. Document Released: 04/05/2008 Document Revised: 07/12/2016 Document Reviewed: 07/12/2016 Elsevier Interactive Patient Education  2017 [employer].

## 2017-07-13 ENCOUNTER — Ambulatory Visit (INDEPENDENT_AMBULATORY_CARE_PROVIDER_SITE_OTHER): Payer: BLUE CROSS/BLUE SHIELD | Admitting: Nurse Practitioner

## 2017-07-13 ENCOUNTER — Encounter: Payer: Self-pay | Admitting: Nurse Practitioner

## 2017-07-13 ENCOUNTER — Other Ambulatory Visit
Admission: RE | Admit: 2017-07-13 | Discharge: 2017-07-13 | Disposition: A | Discharge status: Home / Self Care | Payer: BLUE CROSS/BLUE SHIELD | Source: Ambulatory Visit | Attending: Nurse Practitioner | Admitting: Nurse Practitioner

## 2017-07-13 VITALS — BP 129/90 | HR 49 | Ht 65.0 in | Wt 203.2 lb

## 2017-07-13 DIAGNOSIS — E876 Hypokalemia: Secondary | ICD-10-CM | POA: Insufficient documentation

## 2017-07-13 DIAGNOSIS — I10 Essential (primary) hypertension: Secondary | ICD-10-CM

## 2017-07-13 DIAGNOSIS — R002 Palpitations: Secondary | ICD-10-CM | POA: Diagnosis not present

## 2017-07-13 LAB — BASIC METABOLIC PANEL
Anion gap: 10 (ref 5–15)
BUN: 14 mg/dL (ref 6–20)
CHLORIDE: 98 mmol/L — AB (ref 101–111)
CO2: 30 mmol/L (ref 22–32)
CREATININE: 0.88 mg/dL (ref 0.44–1.00)
Calcium: 10.3 mg/dL (ref 8.9–10.3)
GFR calc Af Amer: >60 mL/min (ref >60)
GFR calc non Af Amer: >60 mL/min (ref >60)
GLUCOSE: 98 mg/dL (ref 65–99)
Potassium: 3.9 mmol/L (ref 3.5–5.1)
Sodium: 138 mmol/L (ref 135–145)

## 2017-07-13 MED ORDER — HYDRALAZINE HCL 25 MG PO TABS: 25.0000 mg | ORAL_TABLET | Freq: Three times a day (TID) | ORAL | 3 refills | Status: DC
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
## 2017-07-13 NOTE — Patient Instructions (Signed)
Medication Instructions:  Your physician has recommended you make the following change in your medication:  INCREASE hydralazine to 25mg  three times a day   Labwork: BMET today at the [facility]  Testing/Procedures: none  Follow-Up: Your physician recommends that you schedule a follow-up appointment in: 3-6 months with Christell Faith, PA-C   Any Other Special Instructions Will Be Listed Below (If Applicable).     If you need a refill on your cardiac medications before your next appointment, please call your pharmacy.

## 2017-07-15 ENCOUNTER — Telehealth: Payer: Self-pay | Admitting: Nurse Practitioner

## 2017-07-15 ENCOUNTER — Ambulatory Visit
Admission: RE | Admit: 2017-07-15 | Discharge: 2017-07-15 | Disposition: A | Discharge status: Home / Self Care | Payer: BLUE CROSS/BLUE SHIELD | Source: Ambulatory Visit | Attending: Family Medicine | Admitting: Family Medicine

## 2017-07-15 DIAGNOSIS — R1011 Right upper quadrant pain: Secondary | ICD-10-CM | POA: Insufficient documentation

## 2017-07-15 DIAGNOSIS — N261 Atrophy of kidney (terminal): Secondary | ICD-10-CM | POA: Diagnosis not present

## 2017-07-15 DIAGNOSIS — R195 Other fecal abnormalities: Secondary | ICD-10-CM | POA: Insufficient documentation

## 2017-07-15 DIAGNOSIS — R932 Abnormal findings on diagnostic imaging of liver and biliary tract: Secondary | ICD-10-CM | POA: Insufficient documentation

## 2017-07-15 NOTE — Telephone Encounter (Signed)
Reviewed recent labs results w/pt who understands to continue current dose of potassium

## 2017-07-15 NOTE — Telephone Encounter (Signed)
Pt returing call regarding lab results.

## 2017-07-18 ENCOUNTER — Telehealth: Payer: Self-pay | Admitting: *Deleted

## 2017-07-18 ENCOUNTER — Telehealth: Payer: Self-pay

## 2017-07-18 DIAGNOSIS — N261 Atrophy of kidney (terminal): Secondary | ICD-10-CM | POA: Insufficient documentation

## 2017-07-18 NOTE — Telephone Encounter (Signed)
Pharmacy requesting a ninety day refill on losartan 100 mg tabs for the patient.

## 2017-07-18 NOTE — Telephone Encounter (Signed)
Spoke with patient and she states that must have been a error because she is no longer on that medication. She states that she will call and report this to her pharmacy. She was appreciative for the call and had no further questions at this time.

## 2017-07-18 NOTE — Telephone Encounter (Signed)
Patient concern about her [place] results. Can you please look over the result.

## 2017-07-18 NOTE — Telephone Encounter (Signed)
Please review for refill. I do not see Losartan on patient's medication list.

## 2017-07-18 NOTE — Telephone Encounter (Signed)
Contacted patient and discussed the RUQ ultrasound findings which are as follows: 1. Possible fatty infiltration of the liver, and discussed that this may come about as a result of obesity, excess alcohol consumption, other metabolic syndrome such as diabetes and hyperlipidemia. Recommended increasing physical activity, avoiding high carbohydrate load foods etc. 2. Right renal atrophy, this may happen because of infection, renal infarct, history of kidney stones etc. Patient reports history of previous kidney stones, she also has Raynaud's disease. I have recommended a referral to nephrology and will place the referral.  Patient verbalized with management and plan

## 2017-07-18 NOTE — Telephone Encounter (Signed)
Losartan was discontinued on this patient and she should be taking hydralazine.

## 2017-07-19 ENCOUNTER — Other Ambulatory Visit: Payer: Self-pay

## 2017-07-19 MED ORDER — ESTRADIOL 1 MG PO TABS: 1.5000 mg | ORAL_TABLET | Freq: Every day | ORAL | 0 refills | Status: DC

## 2017-07-25 ENCOUNTER — Telehealth: Payer: Self-pay | Admitting: Nurse Practitioner

## 2017-07-25 MED ORDER — HYDRALAZINE HCL 50 MG PO TABS: 50.0000 mg | ORAL_TABLET | Freq: Three times a day (TID) | ORAL | 5 refills | Status: DC

## 2017-07-25 NOTE — Telephone Encounter (Signed)
Looks like we need to push her hydralazine to 50 tid.  She should cont to follow bp and call us in a few days.  Will likely need to titrate further.

## 2017-07-25 NOTE — Telephone Encounter (Signed)
Pt reports elevated BP for the past 24 hours.   Sunday afternoon 187/91 Sunday PM       188/106 Monday AM       16 8/87   Last took hydralazine at 4:30am. Lasix 20mg  BID. Chest aches but thinks this is related to fibromyalgia. Losartan and amlodipine d/c'd d/t LE swelling. Hydralazine increased to 25mg  TID at Sept 12 OV. She is spacing doses out q8hrs for better coverage.  Reports now consuming low sodium diet and "watching what I eat".  Will route to Ignacia Bayley, NP, for consideration of increasing hydralazine.

## 2017-07-25 NOTE — Telephone Encounter (Signed)
Pt c/o BP issue: STAT if pt c/o blurred vision, one-sided weakness or slurred speech  1. What are your last 5 BP readings?   this morning-168/87 Last night-188/106 afternoon, 187/91 These are all resting  2. Are you having any other symptoms (ex. Dizziness, headache, blurred vision, passed out)? Some chest pain, states she does have fibromyalgia  3. What is your BP issue? Elevated, states her medication was changes at last visit

## 2017-07-25 NOTE — Telephone Encounter (Signed)
Reviewed recommendations w/pt who verbalized understanding. New prescription sent to [facility], [facility]. Pt to call back with BP readings in a few days.

## 2017-07-26 ENCOUNTER — Encounter: Payer: Self-pay | Admitting: Emergency Medicine

## 2017-07-26 ENCOUNTER — Ambulatory Visit: Payer: BLUE CROSS/BLUE SHIELD | Admitting: Family Medicine

## 2017-07-26 ENCOUNTER — Emergency Department
Admission: EM | Admit: 2017-07-26 | Discharge: 2017-07-26 | Disposition: A | Discharge status: Home / Self Care | Payer: BLUE CROSS/BLUE SHIELD | Attending: Student in an Organized Health Care Education/Training Program | Admitting: Student in an Organized Health Care Education/Training Program

## 2017-07-26 ENCOUNTER — Emergency Department: Payer: BLUE CROSS/BLUE SHIELD

## 2017-07-26 DIAGNOSIS — R51 Headache: Secondary | ICD-10-CM | POA: Diagnosis not present

## 2017-07-26 DIAGNOSIS — E860 Dehydration: Secondary | ICD-10-CM | POA: Insufficient documentation

## 2017-07-26 DIAGNOSIS — J449 Chronic obstructive pulmonary disease, unspecified: Secondary | ICD-10-CM | POA: Diagnosis not present

## 2017-07-26 DIAGNOSIS — N189 Chronic kidney disease, unspecified: Secondary | ICD-10-CM | POA: Insufficient documentation

## 2017-07-26 DIAGNOSIS — G8929 Other chronic pain: Secondary | ICD-10-CM | POA: Insufficient documentation

## 2017-07-26 DIAGNOSIS — Z79899 Other long term (current) drug therapy: Secondary | ICD-10-CM | POA: Diagnosis not present

## 2017-07-26 DIAGNOSIS — E039 Hypothyroidism, unspecified: Secondary | ICD-10-CM | POA: Diagnosis not present

## 2017-07-26 DIAGNOSIS — Z87891 Personal history of nicotine dependence: Secondary | ICD-10-CM | POA: Insufficient documentation

## 2017-07-26 DIAGNOSIS — J45909 Unspecified asthma, uncomplicated: Secondary | ICD-10-CM | POA: Insufficient documentation

## 2017-07-26 DIAGNOSIS — I129 Hypertensive chronic kidney disease with stage 1 through stage 4 chronic kidney disease, or unspecified chronic kidney disease: Secondary | ICD-10-CM | POA: Diagnosis not present

## 2017-07-26 DIAGNOSIS — R531 Weakness: Secondary | ICD-10-CM | POA: Diagnosis present

## 2017-07-26 DIAGNOSIS — R519 Headache, unspecified: Secondary | ICD-10-CM

## 2017-07-26 LAB — URINALYSIS, COMPLETE (UACMP) WITH MICROSCOPIC
Bilirubin Urine: NEGATIVE
Glucose, UA: NEGATIVE mg/dL
Hgb urine dipstick: NEGATIVE
Ketones, ur: NEGATIVE mg/dL
Leukocytes, UA: NEGATIVE
NITRITE: NEGATIVE
PROTEIN: NEGATIVE mg/dL
RBC / HPF: NONE SEEN RBC/hpf (ref 0–5)
Specific Gravity, Urine: 1.011 (ref 1.005–1.030)
pH: 7 (ref 5.0–8.0)

## 2017-07-26 LAB — COMPREHENSIVE METABOLIC PANEL
ALBUMIN: 4.5 g/dL (ref 3.5–5.0)
ALT: 20 U/L (ref 14–54)
ANION GAP: 14 (ref 5–15)
AST: 29 U/L (ref 15–41)
Alkaline Phosphatase: 78 U/L (ref 38–126)
BUN: 21 mg/dL — ABNORMAL HIGH (ref 6–20)
CHLORIDE: 95 mmol/L — AB (ref 101–111)
CO2: 29 mmol/L (ref 22–32)
Calcium: 10.4 mg/dL — ABNORMAL HIGH (ref 8.9–10.3)
Creatinine, Ser: 0.66 mg/dL (ref 0.44–1.00)
GFR calc non Af Amer: >60 mL/min (ref >60)
GLUCOSE: 122 mg/dL — AB (ref 65–99)
Potassium: 3.6 mmol/L (ref 3.5–5.1)
SODIUM: 138 mmol/L (ref 135–145)
Total Bilirubin: 0.4 mg/dL (ref 0.3–1.2)
Total Protein: 8.5 g/dL — ABNORMAL HIGH (ref 6.5–8.1)

## 2017-07-26 LAB — CBC
HEMATOCRIT: 45 % (ref 35.0–47.0)
HEMOGLOBIN: 15.2 g/dL (ref 12.0–16.0)
MCH: 27.8 pg (ref 26.0–34.0)
MCHC: 33.9 g/dL (ref 32.0–36.0)
MCV: 82 fL (ref 80.0–100.0)
Platelets: 402 10*3/uL (ref 150–440)
RBC: 5.48 MIL/uL — AB (ref 3.80–5.20)
RDW: 14.1 % (ref 11.5–14.5)
WBC: 7.6 10*3/uL (ref 3.6–11.0)

## 2017-07-26 LAB — TROPONIN I: Troponin I: 0.03 ng/mL (ref <0.03)

## 2017-07-26 MED ORDER — PROCHLORPERAZINE EDISYLATE 5 MG/ML IJ SOLN
10.0000 mg | Freq: Once | INTRAMUSCULAR | Status: AC
  Administered 2017-07-26: 10 mg via INTRAVENOUS
  Filled 2017-07-26: qty 2

## 2017-07-26 MED ORDER — SODIUM CHLORIDE 0.9 % IV BOLUS (SEPSIS)
1000.0000 mL | Freq: Once | INTRAVENOUS | Status: AC
  Administered 2017-07-26: 1000 mL via INTRAVENOUS

## 2017-07-26 MED ORDER — HYDRALAZINE HCL 50 MG PO TABS
25.0000 mg | ORAL_TABLET | Freq: Once | ORAL | Status: AC
Start: 2017-07-26 — End: 2017-07-26
  Administered 2017-07-26: 25 mg via ORAL
  Filled 2017-07-26: qty 1

## 2017-07-26 MED ORDER — LORAZEPAM 0.5 MG PO TABS
0.5000 mg | ORAL_TABLET | Freq: Once | ORAL | Status: AC
  Administered 2017-07-26: 0.5 mg via ORAL
  Filled 2017-07-26: qty 1

## 2017-07-26 MED ORDER — NITROFURANTOIN MONOHYD MACRO 100 MG PO CAPS: 100.0000 mg | ORAL_CAPSULE | Freq: Two times a day (BID) | ORAL | 0 refills | Status: AC

## 2017-07-26 MED ORDER — DIPHENHYDRAMINE HCL 50 MG/ML IJ SOLN
12.5000 mg | Freq: Once | INTRAMUSCULAR | Status: AC
  Administered 2017-07-26: 12.5 mg via INTRAVENOUS
  Filled 2017-07-26: qty 1

## 2017-07-26 MED ORDER — ACETAMINOPHEN 500 MG PO TABS
1000.0000 mg | ORAL_TABLET | Freq: Once | ORAL | Status: AC
  Administered 2017-07-26: 1000 mg via ORAL
  Filled 2017-07-26: qty 2

## 2017-07-26 NOTE — Discharge Instructions (Signed)
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
## 2017-07-26 NOTE — ED Triage Notes (Signed)
Pt c/o extreme fatigue and weakness getting worse since yesterday. Also had headache.  Hx hypokalemia and has been urinating a lot since yesterday so afraid may be low again.  Ambulatory without difficulty. ST but VSS.  Skin warm and dry.  Respirations unlabored.

## 2017-07-26 NOTE — ED Notes (Signed)
D/c inst to pt.  Pt alert.  Speech clear.

## 2017-07-26 NOTE — ED Notes (Signed)
Meds given as ordered. Pt able to sit up and swallow PO without difficulty. IV meds given. Pt states her husband will be picking her up and he is on his way home from [facility]. Told pt she is to lay down and sleep to help alleviate the pain.

## 2017-07-26 NOTE — ED Notes (Signed)
Pt reports aching all over.  Pt has a headache since yesterday.  Pt report nausea.  Took tylenol without relief.  Pt tearful.  Blood pressure elevated. Pt reports taking meds today.  Pt alert.  Speech clear.  nsr on monitor.

## 2017-07-27 ENCOUNTER — Other Ambulatory Visit: Payer: Self-pay

## 2017-07-27 NOTE — Telephone Encounter (Signed)
90 day supply

## 2017-07-28 MED ORDER — PITAVASTATIN CALCIUM 2 MG PO TABS: 1.0000 | ORAL_TABLET | Freq: Every day | ORAL | 5 refills | Status: DC

## 2017-07-28 NOTE — Telephone Encounter (Signed)
Last sgpt reviewed; Rx approved 

## 2017-07-31 ENCOUNTER — Other Ambulatory Visit: Payer: Self-pay | Admitting: Family Medicine

## 2017-08-01 NOTE — Telephone Encounter (Signed)
Last BP and Cr reviewed; Rx approved

## 2017-08-04 ENCOUNTER — Other Ambulatory Visit: Payer: Self-pay | Admitting: Family Medicine

## 2017-08-04 DIAGNOSIS — M159 Polyosteoarthritis, unspecified: Secondary | ICD-10-CM

## 2017-08-11 ENCOUNTER — Encounter: Payer: Self-pay | Admitting: Family Medicine

## 2017-08-11 ENCOUNTER — Ambulatory Visit (INDEPENDENT_AMBULATORY_CARE_PROVIDER_SITE_OTHER): Payer: BLUE CROSS/BLUE SHIELD | Admitting: Family Medicine

## 2017-08-11 VITALS — BP 142/94 | HR 93 | Temp 98.1°F | Resp 16 | Ht 65.0 in | Wt 204.6 lb

## 2017-08-11 DIAGNOSIS — K2270 Barrett's esophagus without dysplasia: Secondary | ICD-10-CM

## 2017-08-11 DIAGNOSIS — E669 Obesity, unspecified: Secondary | ICD-10-CM

## 2017-08-11 DIAGNOSIS — E876 Hypokalemia: Secondary | ICD-10-CM | POA: Diagnosis not present

## 2017-08-11 DIAGNOSIS — N261 Atrophy of kidney (terminal): Secondary | ICD-10-CM

## 2017-08-11 DIAGNOSIS — E785 Hyperlipidemia, unspecified: Secondary | ICD-10-CM

## 2017-08-11 DIAGNOSIS — Z5181 Encounter for therapeutic drug level monitoring: Secondary | ICD-10-CM | POA: Diagnosis not present

## 2017-08-11 DIAGNOSIS — G4733 Obstructive sleep apnea (adult) (pediatric): Secondary | ICD-10-CM | POA: Insufficient documentation

## 2017-08-11 DIAGNOSIS — Z23 Encounter for immunization: Secondary | ICD-10-CM | POA: Diagnosis not present

## 2017-08-11 DIAGNOSIS — M797 Fibromyalgia: Secondary | ICD-10-CM

## 2017-08-11 DIAGNOSIS — R238 Other skin changes: Secondary | ICD-10-CM | POA: Diagnosis not present

## 2017-08-11 DIAGNOSIS — R233 Spontaneous ecchymoses: Secondary | ICD-10-CM

## 2017-08-11 DIAGNOSIS — K760 Fatty (change of) liver, not elsewhere classified: Secondary | ICD-10-CM | POA: Diagnosis not present

## 2017-08-11 DIAGNOSIS — I10 Essential (primary) hypertension: Secondary | ICD-10-CM

## 2017-08-11 DIAGNOSIS — G4730 Sleep apnea, unspecified: Secondary | ICD-10-CM | POA: Insufficient documentation

## 2017-08-11 DIAGNOSIS — E039 Hypothyroidism, unspecified: Secondary | ICD-10-CM

## 2017-08-11 MED ORDER — DEXLANSOPRAZOLE 30 MG PO CPDR: 30.0000 mg | DELAYED_RELEASE_CAPSULE | Freq: Every day | ORAL | 5 refills | Status: DC

## 2017-08-11 NOTE — Assessment & Plan Note (Signed)
Check lipids (unsweetened tea at 4 am); continue livalo

## 2017-08-11 NOTE — Assessment & Plan Note (Signed)
Recheck TSH since she is trying to lose weight and actually gaining weight

## 2017-08-11 NOTE — Patient Instructions (Addendum)
Avoid foods that come from pigs or cows completely Avoid fried foods Check out the information at familydoctor.org entitled "Nutrition for Weight Loss: What You Need to Know about Fad Diets" Try to lose between 1-2 pounds per week by taking in fewer calories and burning off more calories You can succeed by limiting portions, limiting foods dense in calories and fat, becoming more active, and drinking 8 glasses of water a day (64 ounces) Don't skip meals, especially breakfast, as skipping meals may alter your metabolism Do not use over-the-counter weight loss pills or gimmicks that claim rapid weight loss A healthy BMI (or body mass index) is between 18.5 and 24.9 You can calculate your ideal BMI at the [place] website ClubMonetize.fr We'll refer you to bariatric surgeon We'll get labs today Stop the pantoprazole and start new medicine for Barrett's  DASH Eating Plan DASH stands for "Dietary Approaches to Stop Hypertension." The DASH eating plan is a healthy eating plan that has been shown to reduce high blood pressure (hypertension). It may also reduce your risk for type 2 diabetes, heart disease, and stroke. The DASH eating plan may also help with weight loss. What are tips for following this plan? General guidelines  Avoid eating more than 2,300 mg (milligrams) of salt (sodium) a day. If you have hypertension, you may need to reduce your sodium intake to 1,500 mg a day.  Limit alcohol intake to no more than 1 drink a day for nonpregnant women and 2 drinks a day for men. One drink equals 12 oz of beer, 5 oz of wine, or 1 oz of hard liquor.  Work with your health care provider to maintain a healthy body weight or to lose weight. Ask what an ideal weight is for you.  Get at least 30 minutes of exercise that causes your heart to beat faster (aerobic exercise) most days of the week. Activities may include walking, swimming, or biking.  Work with  your health care provider or diet and nutrition specialist (dietitian) to adjust your eating plan to your individual calorie needs. Reading food labels  Check food labels for the amount of sodium per serving. Choose foods with less than 5 percent of the Daily Value of sodium. Generally, foods with less than 300 mg of sodium per serving fit into this eating plan.  To find whole grains, look for the word "whole" as the first word in the ingredient list. Shopping  Buy products labeled as "low-sodium" or "no salt added."  Buy fresh foods. Avoid canned foods and premade or frozen meals. Cooking  Avoid adding salt when cooking. Use salt-free seasonings or herbs instead of table salt or sea salt. Check with your health care provider or pharmacist before using salt substitutes.  Do not fry foods. Cook foods using healthy methods such as baking, boiling, grilling, and broiling instead.  Cook with heart-healthy oils, such as olive, canola, soybean, or sunflower oil. Meal planning   Eat a balanced diet that includes: ? 5 or more servings of fruits and vegetables each day. At each meal, try to fill half of your plate with fruits and vegetables. ? Up to 6-8 servings of whole grains each day. ? Less than 6 oz of lean meat, poultry, or fish each day. A 3-oz serving of meat is about the same size as a deck of cards. One egg equals 1 oz. ? 2 servings of low-fat dairy each day. ? A serving of nuts, seeds, or beans 5 times each week. ? Heart-healthy  fats. Healthy fats called Omega-3 fatty acids are found in foods such as flaxseeds and coldwater fish, like sardines, salmon, and mackerel.  Limit how much you eat of the following: ? Canned or prepackaged foods. ? Food that is high in trans fat, such as fried foods. ? Food that is high in saturated fat, such as fatty meat. ? Sweets, desserts, sugary drinks, and other foods with added sugar. ? Full-fat dairy products.  Do not salt foods before  eating.  Try to eat at least 2 vegetarian meals each week.  Eat more home-cooked food and less restaurant, buffet, and fast food.  When eating at a restaurant, ask that your food be prepared with less salt or no salt, if possible. What foods are recommended? The items listed may not be a complete list. Talk with your dietitian about what dietary choices are best for you. Grains Whole-grain or whole-wheat bread. Whole-grain or whole-wheat pasta. Brown rice. [place]. Bulgur. Whole-grain and low-sodium cereals. Pita bread. Low-fat, low-sodium crackers. Whole-wheat flour tortillas. Vegetables Fresh or frozen vegetables (raw, steamed, roasted, or grilled). Low-sodium or reduced-sodium tomato and vegetable juice. Low-sodium or reduced-sodium tomato sauce and tomato paste. Low-sodium or reduced-sodium canned vegetables. Fruits All fresh, dried, or frozen fruit. Canned fruit in natural juice (without added sugar). Meat and other protein foods Skinless chicken or [place]. Ground chicken or [place]. Pork with fat trimmed off. Fish and seafood. Egg whites. Dried beans, peas, or lentils. Unsalted nuts, nut butters, and seeds. Unsalted canned beans. Lean cuts of beef with fat trimmed off. Low-sodium, lean deli meat. Dairy Low-fat (1%) or fat-free (skim) milk. Fat-free, low-fat, or reduced-fat cheeses. Nonfat, low-sodium ricotta or cottage cheese. Low-fat or nonfat yogurt. Low-fat, low-sodium cheese. Fats and oils Soft margarine without trans fats. Vegetable oil. Low-fat, reduced-fat, or light mayonnaise and salad dressings (reduced-sodium). Canola, safflower, olive, soybean, and sunflower oils. Avocado. Seasoning and other foods Herbs. Spices. Seasoning mixes without salt. Unsalted popcorn and pretzels. Fat-free sweets. What foods are not recommended? The items listed may not be a complete list. Talk with your dietitian about what dietary choices are best for you. Grains Baked goods made with fat,  such as croissants, muffins, or some breads. Dry pasta or rice meal packs. Vegetables Creamed or fried vegetables. Vegetables in a cheese sauce. Regular canned vegetables (not low-sodium or reduced-sodium). Regular canned tomato sauce and paste (not low-sodium or reduced-sodium). Regular tomato and vegetable juice (not low-sodium or reduced-sodium). Angie Fava. Olives. Fruits Canned fruit in a light or heavy syrup. Fried fruit. Fruit in cream or butter sauce. Meat and other protein foods Fatty cuts of meat. Ribs. Fried meat. Berniece Salines. Sausage. Bologna and other processed lunch meats. Salami. Fatback. Hotdogs. Bratwurst. Salted nuts and seeds. Canned beans with added salt. Canned or smoked fish. Whole eggs or egg yolks. Chicken or [place] with skin. Dairy Whole or 2% milk, cream, and half-and-half. Whole or full-fat cream cheese. Whole-fat or sweetened yogurt. Full-fat cheese. Nondairy creamers. Whipped toppings. Processed cheese and cheese spreads. Fats and oils Butter. Stick margarine. Lard. Shortening. Ghee. Bacon fat. Tropical oils, such as coconut, palm kernel, or palm oil. Seasoning and other foods Salted popcorn and pretzels. Onion salt, garlic salt, seasoned salt, table salt, and sea salt. Worcestershire sauce. Tartar sauce. Barbecue sauce. Teriyaki sauce. Soy sauce, including reduced-sodium. Steak sauce. Canned and packaged gravies. Fish sauce. Oyster sauce. Cocktail sauce. Horseradish that you find on the shelf. Ketchup. Mustard. Meat flavorings and tenderizers. Bouillon cubes. Hot sauce and Tabasco sauce. Premade  or packaged marinades. Premade or packaged taco seasonings. Relishes. Regular salad dressings. Where to find more information:  National Heart, Lung, and [place]: https://wilson-eaton.com/  American Heart Association: www.heart.org Summary  The DASH eating plan is a healthy eating plan that has been shown to reduce high blood pressure (hypertension). It may also reduce your risk for  type 2 diabetes, heart disease, and stroke.  With the DASH eating plan, you should limit salt (sodium) intake to 2,300 mg a day. If you have hypertension, you may need to reduce your sodium intake to 1,500 mg a day.  When on the DASH eating plan, aim to eat more fresh fruits and vegetables, whole grains, lean proteins, low-fat dairy, and heart-healthy fats.  Work with your health care provider or diet and nutrition specialist (dietitian) to adjust your eating plan to your individual calorie needs. This information is not intended to replace advice given to you by your health care provider. Make sure you discuss any questions you have with your health care provider. Document Released: 10/07/2011 Document Revised: 10/11/2016 Document Reviewed: 10/11/2016 Elsevier Interactive Patient Education  2017 [employer].

## 2017-08-11 NOTE — Assessment & Plan Note (Signed)
Check liver and kidneys 

## 2017-08-11 NOTE — Assessment & Plan Note (Signed)
Weight loss encouraged 

## 2017-08-11 NOTE — Assessment & Plan Note (Addendum)
Managed by cardiologist; not to goal; she'll work with cardiology on this

## 2017-08-11 NOTE — Assessment & Plan Note (Signed)
Changing PPI

## 2017-08-11 NOTE — Assessment & Plan Note (Signed)
Limiting ability to exercise and lose weight

## 2017-08-11 NOTE — Assessment & Plan Note (Signed)
Refer to bariatric surgeon 
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
## 2017-08-11 NOTE — Assessment & Plan Note (Signed)
Patient is unfortunately not using her CPAP; I pointed out her elevated RBC on last labs

## 2017-08-11 NOTE — Assessment & Plan Note (Signed)
Going to see nephrologist on Monday

## 2017-08-12 ENCOUNTER — Other Ambulatory Visit: Payer: Self-pay | Admitting: Family Medicine

## 2017-08-12 DIAGNOSIS — Z5181 Encounter for therapeutic drug level monitoring: Secondary | ICD-10-CM

## 2017-08-12 DIAGNOSIS — E785 Hyperlipidemia, unspecified: Secondary | ICD-10-CM

## 2017-08-12 MED ORDER — ROSUVASTATIN CALCIUM 20 MG PO TABS: 20.0000 mg | ORAL_TABLET | Freq: Every day | ORAL | 1 refills | Status: DC

## 2017-08-12 NOTE — Progress Notes (Signed)
Increase statin; check labs in 6-8 weeks

## 2017-08-14 ENCOUNTER — Encounter: Payer: Self-pay | Admitting: Family Medicine

## 2017-08-14 LAB — CBC WITH DIFFERENTIAL/PLATELET
BASOS PCT: 1 %
Basophils Absolute: 40 cells/uL (ref 0–200)
EOS ABS: 128 {cells}/uL (ref 15–500)
Eosinophils Relative: 3.2 %
HCT: 44 % (ref 35.0–45.0)
Hemoglobin: 14.3 g/dL (ref 11.7–15.5)
Lymphs Abs: 1292 cells/uL (ref 850–3900)
MCH: 27 pg (ref 27.0–33.0)
MCHC: 32.5 g/dL (ref 32.0–36.0)
MCV: 83 fL (ref 80.0–100.0)
MONOS PCT: 7.7 %
MPV: 9 fL (ref 7.5–12.5)
NEUTROS PCT: 55.8 %
Neutro Abs: 2232 cells/uL (ref 1500–7800)
PLATELETS: 401 10*3/uL — AB (ref 140–400)
RBC: 5.3 10*6/uL — AB (ref 3.80–5.10)
RDW: 13.2 % (ref 11.0–15.0)
TOTAL LYMPHOCYTE: 32.3 %
WBC mixed population: 308 cells/uL (ref 200–950)
WBC: 4 10*3/uL (ref 3.8–10.8)

## 2017-08-14 LAB — LIPID PANEL
CHOLESTEROL: 415 mg/dL — AB (ref <200)
HDL: 63 mg/dL (ref >50)
Non-HDL Cholesterol (Calc): 352 mg/dL (calc) — ABNORMAL HIGH (ref <130)
Total CHOL/HDL Ratio: 6.6 (calc) — ABNORMAL HIGH (ref <5.0)
Triglycerides: 623 mg/dL — ABNORMAL HIGH (ref <150)

## 2017-08-14 LAB — PROTIME-INR
INR: 0.9
Prothrombin Time: 9.8 s (ref 9.0–11.5)

## 2017-08-14 LAB — COMPLETE METABOLIC PANEL WITH GFR
AG Ratio: 1.2 (calc) (ref 1.0–2.5)
ALBUMIN MSPROF: 4.3 g/dL (ref 3.6–5.1)
ALKALINE PHOSPHATASE (APISO): 76 U/L (ref 33–130)
ALT: 18 U/L (ref 6–29)
AST: 21 U/L (ref 10–35)
BUN: 21 mg/dL (ref 7–25)
CO2: 33 mmol/L — AB (ref 20–32)
CREATININE: 0.69 mg/dL (ref 0.50–1.05)
Calcium: 10.2 mg/dL (ref 8.6–10.4)
Chloride: 95 mmol/L — ABNORMAL LOW (ref 98–110)
GFR, EST AFRICAN AMERICAN: 117 mL/min/{1.73_m2} (ref >=60)
GFR, EST NON AFRICAN AMERICAN: 101 mL/min/{1.73_m2} (ref >=60)
GLOBULIN: 3.5 g/dL (ref 1.9–3.7)
Glucose, Bld: 98 mg/dL (ref 65–99)
Potassium: 3.6 mmol/L (ref 3.5–5.3)
SODIUM: 137 mmol/L (ref 135–146)
TOTAL PROTEIN: 7.8 g/dL (ref 6.1–8.1)
Total Bilirubin: 0.3 mg/dL (ref 0.2–1.2)

## 2017-08-14 LAB — FACTOR 8 RISTOCETIN COFACTOR: RISTOCETIN CO-FACTOR, PLASMA: 143 % (ref 42–200)

## 2017-08-14 LAB — TSH: TSH: 4.36 mIU/L

## 2017-08-14 LAB — APTT: APTT: 28 s (ref 22–34)

## 2017-08-15 ENCOUNTER — Other Ambulatory Visit: Payer: Self-pay

## 2017-08-15 DIAGNOSIS — M159 Polyosteoarthritis, unspecified: Secondary | ICD-10-CM

## 2017-08-15 MED ORDER — DICLOFENAC SODIUM 1 % TD GEL: 2.0000 g | Freq: Two times a day (BID) | TRANSDERMAL | 1 refills | Status: DC | PRN

## 2017-08-15 NOTE — Telephone Encounter (Signed)
Request 90 day supply

## 2017-08-18 ENCOUNTER — Emergency Department: Payer: BLUE CROSS/BLUE SHIELD

## 2017-08-18 ENCOUNTER — Emergency Department
Admission: EM | Admit: 2017-08-18 | Discharge: 2017-08-18 | Disposition: A | Discharge status: Home / Self Care | Payer: BLUE CROSS/BLUE SHIELD | Attending: Emergency Medicine | Admitting: Emergency Medicine

## 2017-08-18 ENCOUNTER — Encounter: Payer: Self-pay | Admitting: Emergency Medicine

## 2017-08-18 DIAGNOSIS — Z79899 Other long term (current) drug therapy: Secondary | ICD-10-CM | POA: Insufficient documentation

## 2017-08-18 DIAGNOSIS — Z87891 Personal history of nicotine dependence: Secondary | ICD-10-CM | POA: Insufficient documentation

## 2017-08-18 DIAGNOSIS — J449 Chronic obstructive pulmonary disease, unspecified: Secondary | ICD-10-CM | POA: Insufficient documentation

## 2017-08-18 DIAGNOSIS — I10 Essential (primary) hypertension: Secondary | ICD-10-CM | POA: Diagnosis not present

## 2017-08-18 DIAGNOSIS — E876 Hypokalemia: Secondary | ICD-10-CM | POA: Diagnosis not present

## 2017-08-18 DIAGNOSIS — J45909 Unspecified asthma, uncomplicated: Secondary | ICD-10-CM | POA: Diagnosis not present

## 2017-08-18 LAB — BASIC METABOLIC PANEL
ANION GAP: 13 (ref 5–15)
BUN: 17 mg/dL (ref 6–20)
CALCIUM: 10.5 mg/dL — AB (ref 8.9–10.3)
CO2: 37 mmol/L — ABNORMAL HIGH (ref 22–32)
Chloride: 89 mmol/L — ABNORMAL LOW (ref 101–111)
Creatinine, Ser: 0.79 mg/dL (ref 0.44–1.00)
Glucose, Bld: 96 mg/dL (ref 65–99)
Potassium: 2.9 mmol/L — ABNORMAL LOW (ref 3.5–5.1)
SODIUM: 139 mmol/L (ref 135–145)

## 2017-08-18 LAB — CBC WITH DIFFERENTIAL/PLATELET
BASOS ABS: 0 10*3/uL (ref 0–0.1)
BASOS PCT: 1 %
EOS ABS: 0.2 10*3/uL (ref 0–0.7)
EOS PCT: 3 %
HCT: 42.9 % (ref 35.0–47.0)
Hemoglobin: 14.3 g/dL (ref 12.0–16.0)
Lymphocytes Relative: 29 %
Lymphs Abs: 1.9 10*3/uL (ref 1.0–3.6)
MCH: 27.7 pg (ref 26.0–34.0)
MCHC: 33.3 g/dL (ref 32.0–36.0)
MCV: 83.4 fL (ref 80.0–100.0)
MONO ABS: 0.4 10*3/uL (ref 0.2–0.9)
Monocytes Relative: 7 %
NEUTROS ABS: 4 10*3/uL (ref 1.4–6.5)
Neutrophils Relative %: 60 %
Platelets: 428 10*3/uL (ref 150–440)
RBC: 5.14 MIL/uL (ref 3.80–5.20)
RDW: 14 % (ref 11.5–14.5)
WBC: 6.5 10*3/uL (ref 3.6–11.0)

## 2017-08-18 MED ORDER — MAGNESIUM CHLORIDE 64 MG PO TBEC
2.0000 | DELAYED_RELEASE_TABLET | Freq: Once | ORAL | Status: AC
  Administered 2017-08-18: 128 mg via ORAL
  Filled 2017-08-18: qty 2

## 2017-08-18 MED ORDER — POTASSIUM CHLORIDE CRYS ER 20 MEQ PO TBCR
80.0000 meq | EXTENDED_RELEASE_TABLET | Freq: Once | ORAL | Status: AC
  Administered 2017-08-18: 80 meq via ORAL
  Filled 2017-08-18: qty 4

## 2017-08-18 NOTE — ED Triage Notes (Signed)
Pt reports had blood drawn Monday for routine labs and they called today because potassium 2.6.  Has had fatigue all week but admits to leg cramps today.  Has had intermittent chest pain today.  Ambulatory to triage.  VSS.

## 2017-08-18 NOTE — ED Notes (Signed)
Patient presents to the ED with likely hypokalemia.  Patient states her potassium was 2.6 on Monday.  Patient is in no obvious distress at this time.  Reports feeling weak.

## 2017-08-18 NOTE — Discharge Instructions (Signed)
For the next week please increase your home dose of potassium and magnesium and follow up with your primary care physician this coming Monday for recheck. Return to the emergency department sooner for any concerns whatsoever.  It was a pleasure to take care of you today, and thank you for coming to our emergency department.  If you have any questions or concerns before leaving please ask the nurse to grab me and I'm more than happy to go through your aftercare instructions again.  If you were prescribed any opioid pain medication today such as Norco, Vicodin, Percocet, morphine, hydrocodone, or oxycodone please make sure you do not drive when you are taking this medication as it can alter your ability to drive safely.  If you have any concerns once you are home that you are not improving or are in fact getting worse before you can make it to your follow-up appointment, please do not hesitate to call 911 and come back for further evaluation.  Darel Hong, MD  Results for orders placed or performed during the hospital encounter of 08/18/17  CBC with Differential  Result Value Ref Range   WBC 6.5 3.6 - 11.0 K/uL   RBC 5.14 3.80 - 5.20 MIL/uL   Hemoglobin 14.3 12.0 - 16.0 g/dL   HCT 42.9 35.0 - 47.0 %   MCV 83.4 80.0 - 100.0 fL   MCH 27.7 26.0 - 34.0 pg   MCHC 33.3 32.0 - 36.0 g/dL   RDW 14.0 11.5 - 14.5 %   Platelets 428 150 - 440 K/uL   Neutrophils Relative % 60 %   Neutro Abs 4.0 1.4 - 6.5 K/uL   Lymphocytes Relative 29 %   Lymphs Abs 1.9 1.0 - 3.6 K/uL   Monocytes Relative 7 %   Monocytes Absolute 0.4 0.2 - 0.9 K/uL   Eosinophils Relative 3 %   Eosinophils Absolute 0.2 0 - 0.7 K/uL   Basophils Relative 1 %   Basophils Absolute 0.0 0 - 0.1 K/uL  Basic metabolic panel  Result Value Ref Range   Sodium 139 135 - 145 mmol/L   Potassium 2.9 (L) 3.5 - 5.1 mmol/L   Chloride 89 (L) 101 - 111 mmol/L   CO2 37 (H) 22 - 32 mmol/L   Glucose, Bld 96 65 - 99 mg/dL   BUN 17 6 - 20 mg/dL   Creatinine, Ser 0.79 0.44 - 1.00 mg/dL   Calcium 10.5 (H) 8.9 - 10.3 mg/dL   GFR calc non Af Amer >60 >60 mL/min   GFR calc Af Amer >60 >60 mL/min   Anion gap 13 5 - 15   Dg Chest 2 View  Result Date: 08/18/2017 CLINICAL DATA:  Short of breath, history of fibromyalgia EXAM: CHEST  2 VIEW COMPARISON:  Chest x-ray of 07/26/2017 FINDINGS: No active infiltrate or effusion is seen. Mediastinal and hilar contours are unremarkable. The heart is within normal limits in size. No bony abnormality is seen. IMPRESSION: No active cardiopulmonary disease. Electronically Signed   By: Ivar Drape M.D.   On: 08/18/2017 14:56   Dg Chest 2 View  Result Date: 07/26/2017 CLINICAL DATA:  Extreme fatigue and weakness.  Headaches. EXAM: CHEST  2 VIEW COMPARISON:  Radiographs and CT 06/21/2017. FINDINGS: The heart size and mediastinal contours are normal. The lungs are clear. There is no pleural effusion or pneumothorax. No acute osseous findings are identified. IMPRESSION: Stable chest.  No active cardiopulmonary process. Electronically Signed   By: Caryl Comes.D.  On: 07/26/2017 15:25

## 2017-08-18 NOTE — ED Notes (Signed)
Discussed pt with dr Corky Downs. Will wait to treat since blood was drawn on Monday.
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
## 2017-08-22 ENCOUNTER — Telehealth: Payer: Self-pay | Admitting: Family Medicine

## 2017-08-22 NOTE — Telephone Encounter (Signed)
Please follow-up with pharmacy and patient about this; thank you

## 2017-08-22 NOTE — Telephone Encounter (Signed)
I called and left on pharmacy voicemail earlier today.  Pt has been sent e-mail to check with them at the end of the day.
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
## 2017-08-24 ENCOUNTER — Other Ambulatory Visit: Payer: Self-pay

## 2017-08-24 NOTE — Telephone Encounter (Signed)
Request for 90 day supply last filled 02/28/2017

## 2017-08-25 NOTE — Telephone Encounter (Signed)
Request denied. Pt has refills.

## 2017-08-25 NOTE — Telephone Encounter (Signed)
Lab Results  Component Value Date   TSH 4.36 08/11/2017   I prescribed a year's worth of thyroid medicine to [facility] in April; she should not be out of this Please resolve with the pharmacy Thank you

## 2017-09-05 ENCOUNTER — Telehealth: Payer: Self-pay | Admitting: Family Medicine

## 2017-09-05 NOTE — Telephone Encounter (Signed)
erroneous encounter nothing sent?

## 2017-09-09 ENCOUNTER — Ambulatory Visit: Payer: BLUE CROSS/BLUE SHIELD | Admitting: Family Medicine

## 2017-09-12 ENCOUNTER — Telehealth: Payer: Self-pay | Admitting: Family Medicine

## 2017-09-12 ENCOUNTER — Ambulatory Visit: Payer: Self-pay

## 2017-09-12 DIAGNOSIS — Z91013 Allergy to seafood: Secondary | ICD-10-CM

## 2017-09-12 MED ORDER — EPINEPHRINE 0.3 MG/0.3ML IJ SOAJ: 0.3000 mg | Freq: Once | INTRAMUSCULAR | 0 refills | Status: AC

## 2017-09-12 NOTE — Telephone Encounter (Signed)
I don't manage her OSA Please see orders from November 2017; this was managed by [facility]

## 2017-09-12 NOTE — Addendum Note (Signed)
Addended by: Saunders Glance A on: 09/12/2017 03:06 PM   Modules accepted: Orders

## 2017-09-12 NOTE — Telephone Encounter (Signed)
Pt states she has blister filled groups of skin rash that are in a "line where my Bra lies on my back". ? Shingles. Pt states she is having areas where her skin feels like she is having tingling but there is not a rash. Care advise given Appt made for tomorrow with Dr. Manuella Ghazi 09/13/17 @ 1020  Reason for Disposition . [1] Shingles rash (matches SYMPTOMS) AND [2] onset within past 72 hours  Answer Assessment - Initial Assessment Questions 1. APPEARANCE of RASH: "Describe the rash."      Blister filled rash 3 "blisters in a pod" Has 3 "pods" 2. LOCATION: "Where is the rash located?"      Back were Bra is lying where it snaps in the back 3. NUMBER: "How many spots are there?"      3 to a group  And there are 3 groups that pt can see. 4. SIZE: "How big are the spots?" (Inches, centimeters or compare to size of a coin)      Quarter sized 5. ONSET: "When did the rash start?"      3 days ago 6. ITCHING: "Does the rash itch?" If so, ask: "How bad is the itch?"  (Scale 1-10; or mild, moderate, severe)     Yes the it "burns like Hell"  7. PAIN: "Does the rash hurt?" If so, ask: "How bad is the pain?"  (Scale 1-10; or mild, moderate, severe)     Yes it hurts 5 - 9/10 8. OTHER SYMPTOMS: "Do you have any other symptoms?" (e.g., fever)     no 9. PREGNANCY: "Is there any chance you are pregnant?" "When was your last menstrual period?"     No s/p total hysterectomy  Protocols used: SHINGLES-A-AH, RASH OR REDNESS - LOCALIZED-A-AH

## 2017-09-12 NOTE — Telephone Encounter (Signed)
Pt had sleep study and wants to check on status with referral to home health for CPAP

## 2017-09-12 NOTE — Telephone Encounter (Signed)
I contacted this patient and informed her that Dr. Sanda Klein is not the one who manages her OSA and that she would have to contact [facility]. She stated that she did not remember who was in charge of it but that she will contact their office to f/u on getting her supplies.  She then went on to say that she never got the Epi-Pen that was supposed to be called in to [facility]. I informed her that I would let Dr. Sanda Klein know and she said thanks.

## 2017-09-12 NOTE — Telephone Encounter (Signed)
I am not sure if any results has been sent to Dr. Sanda Klein dictating what supplies she needs so I am not able to do anything at the moment but I will see if it was sent to Dr. Sanda Klein via email or if it has been printed and placed in the folders to be reviewed.

## 2017-09-12 NOTE — Addendum Note (Signed)
Addended by: LADA, Satira Anis on: 09/12/2017 03:20 PM   Modules accepted: Orders

## 2017-09-13 ENCOUNTER — Other Ambulatory Visit (INDEPENDENT_AMBULATORY_CARE_PROVIDER_SITE_OTHER): Payer: Self-pay | Admitting: Nephrology

## 2017-09-13 ENCOUNTER — Ambulatory Visit (INDEPENDENT_AMBULATORY_CARE_PROVIDER_SITE_OTHER): Payer: BLUE CROSS/BLUE SHIELD | Admitting: Family Medicine

## 2017-09-13 ENCOUNTER — Telehealth: Payer: Self-pay | Admitting: Internal Medicine

## 2017-09-13 ENCOUNTER — Encounter: Payer: Self-pay | Admitting: Family Medicine

## 2017-09-13 VITALS — BP 142/86 | HR 85 | Temp 98.6°F | Resp 16 | Wt 199.1 lb

## 2017-09-13 DIAGNOSIS — B029 Zoster without complications: Secondary | ICD-10-CM

## 2017-09-13 DIAGNOSIS — I10 Essential (primary) hypertension: Secondary | ICD-10-CM

## 2017-09-13 DIAGNOSIS — N261 Atrophy of kidney (terminal): Secondary | ICD-10-CM

## 2017-09-13 MED ORDER — VALACYCLOVIR HCL 1 G PO TABS: 1000.0000 mg | ORAL_TABLET | Freq: Three times a day (TID) | ORAL | 0 refills | Status: AC

## 2017-09-13 MED ORDER — GABAPENTIN 100 MG PO CAPS: 100.0000 mg | ORAL_CAPSULE | Freq: Three times a day (TID) | ORAL | 0 refills | Status: DC
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
## 2017-09-13 NOTE — Telephone Encounter (Signed)
Pt calling stating she thinks she may need a CPAP machine   She is on disability with limited income.  She's been suggested that she may need one  Please advise.

## 2017-09-13 NOTE — Telephone Encounter (Signed)
Appt scheduled 12/4 to discuss options.ss

## 2017-09-14 ENCOUNTER — Encounter (INDEPENDENT_AMBULATORY_CARE_PROVIDER_SITE_OTHER): Payer: Self-pay

## 2017-10-04 ENCOUNTER — Encounter: Payer: Self-pay | Admitting: Internal Medicine

## 2017-10-04 ENCOUNTER — Ambulatory Visit (INDEPENDENT_AMBULATORY_CARE_PROVIDER_SITE_OTHER): Payer: BLUE CROSS/BLUE SHIELD | Admitting: Internal Medicine

## 2017-10-04 VITALS — BP 198/110 | HR 114 | Ht 65.0 in | Wt 193.0 lb

## 2017-10-04 DIAGNOSIS — G4733 Obstructive sleep apnea (adult) (pediatric): Secondary | ICD-10-CM

## 2017-10-04 NOTE — Patient Instructions (Signed)
Discuss options about obtaining CPAP

## 2017-10-04 NOTE — Progress Notes (Signed)
[facility] [facility] Consultation      Date: 10/04/2017,   MRN# [id number] Laura [name] 11/08/1965 Code Status:  Code Status History    This patient does not have a recorded code status. Please follow your organizational policy for patients in this situation.     Hosp day:@LENGTHOFSTAYDAYS @ Referring MD: @ATDPROV @     PCP:      AdmissionWeight: 193 lb (87.5 kg)                 CurrentWeight: 193 lb (87.5 kg) Laura [name] Seen is a 51 y.o. old female seen in consultation for sl;eep problems at the request of Dr. Sanda Klein.     CHIEF COMPLAINT:   Problems sleeping   HISTORY OF PRESENT ILLNESS   Patient dx with OSA Has CPAP titration study but REFUSED to obtain CPAP due to money issues Patient has uncontrolled HTN and needs therapy ASAP  Patient still with fatigue and daytime sleepiness    REVIEW OF SYSTEMS   Review of Systems  Constitutional: Positive for malaise/fatigue. Negative for chills, diaphoresis, fever and weight loss.  HENT: Negative for congestion and hearing loss.   Eyes: Negative for blurred vision and double vision.  Respiratory: Negative for cough, hemoptysis, sputum production, shortness of breath and wheezing.   Cardiovascular: Negative for chest pain, palpitations and orthopnea.  Gastrointestinal: Positive for heartburn. Negative for abdominal pain, nausea and vomiting.  Genitourinary: Negative for dysuria and urgency.  Musculoskeletal: Negative for back pain, myalgias and neck pain.  Skin: Negative for rash.  Neurological: Negative for dizziness, tingling, tremors, weakness and headaches.  Endo/Heme/Allergies: Does not bruise/bleed easily.  Psychiatric/Behavioral: Negative for depression, substance abuse and suicidal ideas. The patient is nervous/anxious.   All other systems reviewed and are negative.    VS: BP (!) 198/110 (BP Location: Left Arm, Cuff Size: Normal)   Pulse (!) 114   Ht 5\' 5"  (1.651 m)   Wt 193 lb (87.5 kg)   SpO2 95%    BMI 32.12 kg/m      PHYSICAL EXAM  Physical Exam  Constitutional: She is oriented to person, place, and time. She appears well-developed and well-nourished. No distress.  HENT:  Head: Normocephalic and atraumatic.  Mouth/Throat: No oropharyngeal exudate.  Eyes: EOM are normal. Pupils are equal, round, and reactive to light. No scleral icterus.  Neck: Normal range of motion. Neck supple.  Cardiovascular: Normal rate, regular rhythm and normal heart sounds.  No murmur heard. Pulmonary/Chest: No stridor. No respiratory distress. She has no wheezes. She has no rales.  Abdominal: Soft. Bowel sounds are normal.  Musculoskeletal: Normal range of motion. She exhibits edema.  Neurological: She is alert and oriented to person, place, and time. No cranial nerve deficit.  Skin: Skin is warm. She is not diaphoretic.  Psychiatric: She has a normal mood and affect.           ASSESSMENT/PLAN   51 yo white female with h/o Fibromyalgia with problems with sleep with  underlying OSA with increased weight gain and HTN with Mild intermittent well controlled ASTHMA with Well controlled GERD    1. Patient to find out what options she has 2.continue albuterol as needed 3.continue PPI and follow up with GI this week 4.diet and exercise and weight loss recommended   Patient asked to follow up after she decides on she wants to do and to pursue CPAP therapy  Corrin Parker, M.D.  [facility] Medicine  Medical Director [facility]  Medical Director [place] Department

## 2017-10-12 ENCOUNTER — Telehealth: Payer: Self-pay | Admitting: Family Medicine

## 2017-10-12 DIAGNOSIS — Z5181 Encounter for therapeutic drug level monitoring: Secondary | ICD-10-CM

## 2017-10-12 DIAGNOSIS — E785 Hyperlipidemia, unspecified: Secondary | ICD-10-CM

## 2017-10-12 NOTE — Telephone Encounter (Signed)
Please remind patient of the blood work we had hoped to get around November 26th Please ask her to come fasting in the next week to get those results done Thank you

## 2017-10-12 NOTE — Telephone Encounter (Signed)
Pt notified will come in tomorrow 

## 2017-10-13 ENCOUNTER — Ambulatory Visit (INDEPENDENT_AMBULATORY_CARE_PROVIDER_SITE_OTHER): Payer: BLUE CROSS/BLUE SHIELD | Admitting: Cardiovascular Disease

## 2017-10-13 ENCOUNTER — Encounter: Payer: Self-pay | Admitting: Cardiovascular Disease

## 2017-10-13 VITALS — BP 168/100 | HR 74 | Ht 65.0 in | Wt 190.0 lb

## 2017-10-13 DIAGNOSIS — I493 Ventricular premature depolarization: Secondary | ICD-10-CM

## 2017-10-13 DIAGNOSIS — I10 Essential (primary) hypertension: Secondary | ICD-10-CM

## 2017-10-13 NOTE — Patient Instructions (Signed)
Medication Instructions: Continue same medications.   Labwork: None.   Procedures/Testing: None.   Follow-Up: 6 months with Gerald Stabs or Thurmond Butts.   Any Additional Special Instructions Will Be Listed Below (If Applicable).     If you need a refill on your cardiac medications before your next appointment, please call your pharmacy.
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
## 2017-10-21 ENCOUNTER — Ambulatory Visit (INDEPENDENT_AMBULATORY_CARE_PROVIDER_SITE_OTHER): Payer: BLUE CROSS/BLUE SHIELD

## 2017-10-21 DIAGNOSIS — N261 Atrophy of kidney (terminal): Secondary | ICD-10-CM | POA: Diagnosis not present

## 2017-10-26 ENCOUNTER — Encounter (INDEPENDENT_AMBULATORY_CARE_PROVIDER_SITE_OTHER): Payer: Self-pay

## 2017-10-28 ENCOUNTER — Other Ambulatory Visit: Payer: Self-pay | Admitting: Family Medicine

## 2017-10-28 NOTE — Telephone Encounter (Signed)
Left detailed voicemail.  Also called on 12/12 and patient stated she would come in and has not done so yet.

## 2017-10-28 NOTE — Telephone Encounter (Signed)
See lab result note about the cholesterol from October. She was due for labs 6-8 weeks later. Please ask her to have those done ASAP. I'll send one week of medicine. Thank you

## 2017-11-09 ENCOUNTER — Telehealth: Payer: Self-pay | Admitting: Family Medicine

## 2017-11-09 NOTE — Telephone Encounter (Signed)
Pt.notified

## 2017-11-09 NOTE — Telephone Encounter (Signed)
Please encourage patient to have her cholesterol done prior to her appointment so we'll have the lab results to review Because her TG are high, I do want her to be fasting for the lab draw Thank you

## 2017-11-10 LAB — LIPID PANEL
CHOL/HDL RATIO: 3.8 (calc) (ref <5.0)
CHOLESTEROL: 252 mg/dL — AB (ref <200)
HDL: 67 mg/dL (ref >50)
LDL CHOLESTEROL (CALC): 145 mg/dL — AB
Non-HDL Cholesterol (Calc): 185 mg/dL (calc) — ABNORMAL HIGH (ref <130)
TRIGLYCERIDES: 245 mg/dL — AB (ref <150)

## 2017-11-10 LAB — ALT: ALT: 14 U/L (ref 6–29)

## 2017-11-11 ENCOUNTER — Other Ambulatory Visit: Payer: Self-pay | Admitting: Family Medicine

## 2017-11-11 MED ORDER — ROSUVASTATIN CALCIUM 20 MG PO TABS: 20.0000 mg | ORAL_TABLET | Freq: Every day | ORAL | 0 refills | Status: DC

## 2017-11-11 NOTE — Progress Notes (Signed)
Note to patient; waiting to see if she wants to increase dose of crestor OR leave at 20 mg and add another agent (welchol or zetia)

## 2017-11-14 ENCOUNTER — Ambulatory Visit: Payer: 59 | Admitting: Family Medicine

## 2017-11-14 ENCOUNTER — Encounter: Payer: Self-pay | Admitting: Family Medicine

## 2017-11-14 VITALS — BP 102/60 | HR 73 | Temp 97.5°F | Resp 16 | Wt 195.0 lb

## 2017-11-14 DIAGNOSIS — G4730 Sleep apnea, unspecified: Secondary | ICD-10-CM

## 2017-11-14 DIAGNOSIS — B9689 Other specified bacterial agents as the cause of diseases classified elsewhere: Secondary | ICD-10-CM

## 2017-11-14 DIAGNOSIS — N760 Acute vaginitis: Secondary | ICD-10-CM | POA: Diagnosis not present

## 2017-11-14 DIAGNOSIS — K219 Gastro-esophageal reflux disease without esophagitis: Secondary | ICD-10-CM | POA: Diagnosis not present

## 2017-11-14 DIAGNOSIS — J4520 Mild intermittent asthma, uncomplicated: Secondary | ICD-10-CM

## 2017-11-14 DIAGNOSIS — R7303 Prediabetes: Secondary | ICD-10-CM | POA: Diagnosis not present

## 2017-11-14 DIAGNOSIS — R1013 Epigastric pain: Secondary | ICD-10-CM

## 2017-11-14 DIAGNOSIS — R600 Localized edema: Secondary | ICD-10-CM

## 2017-11-14 DIAGNOSIS — I10 Essential (primary) hypertension: Secondary | ICD-10-CM

## 2017-11-14 DIAGNOSIS — E785 Hyperlipidemia, unspecified: Secondary | ICD-10-CM

## 2017-11-14 MED ORDER — PANTOPRAZOLE SODIUM 40 MG PO TBEC: 40.0000 mg | DELAYED_RELEASE_TABLET | Freq: Every day | ORAL | 2 refills | Status: DC

## 2017-11-14 MED ORDER — ROSUVASTATIN CALCIUM 40 MG PO TABS: 40.0000 mg | ORAL_TABLET | Freq: Every day | ORAL | 1 refills | Status: DC

## 2017-11-14 MED ORDER — PANTOPRAZOLE SODIUM 40 MG PO TBEC: 40.0000 mg | DELAYED_RELEASE_TABLET | Freq: Every day | ORAL | 1 refills | Status: DC

## 2017-11-14 MED ORDER — METRONIDAZOLE 500 MG PO TABS: 500.0000 mg | ORAL_TABLET | Freq: Two times a day (BID) | ORAL | 0 refills | Status: DC

## 2017-11-14 MED ORDER — EPINEPHRINE 0.3 MG/0.3ML IJ SOAJ: 0.3000 mg | Freq: Once | INTRAMUSCULAR | 1 refills | Status: AC

## 2017-11-14 NOTE — Assessment & Plan Note (Signed)
Check glucose and A1c with next set of labs in 6-8 weeks

## 2017-11-14 NOTE — Assessment & Plan Note (Signed)
Refill inhalers; she will get rid of carpet

## 2017-11-14 NOTE — Assessment & Plan Note (Signed)
Well controlled 

## 2017-11-14 NOTE — Assessment & Plan Note (Signed)
No diuretics

## 2017-11-14 NOTE — Patient Instructions (Addendum)
Check out [employer] and other websites for [employer] ideas and recipes Try meatless crumbles, Boca burgers, Sweet Earth seitan, [place] vegan sausage mix (all plant protein), Sweet Earth [place] breakfast burritos, etc. You can further limit intake of saturated fats by switching to dairy free products (coconut milk coffee creamer, almond milk, Tofutti brand cream cheese and sour cream, Follow Your Heart vegenaise (mayonnaise alternative) and Follow Your Heart cheese alternative (the Antoine Primas is my favorite), and Chao cheese Look up recipe for cashew and nutritional yeast parmesan cheese substitute Increase your crestor (rosuvastatin) to 40 mg daily Return in 6-8 weeks for fasting labs
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
## 2017-11-14 NOTE — Assessment & Plan Note (Signed)
On CPAP now; managed by pulmonologist

## 2017-11-14 NOTE — Assessment & Plan Note (Signed)
Increase statin and recheck lipids in 6-8 weeks; so proud of her for changing her diet

## 2017-12-07 IMAGING — CR DG CHEST 2V
2 series · 2 of 2 positions shown · non-contrast
Comparison: Radiographs and CT 06/21/2017.

CLINICAL DATA: Extreme fatigue and weakness.  Headaches.

EXAM:
CHEST  2 VIEW

[chest pa]
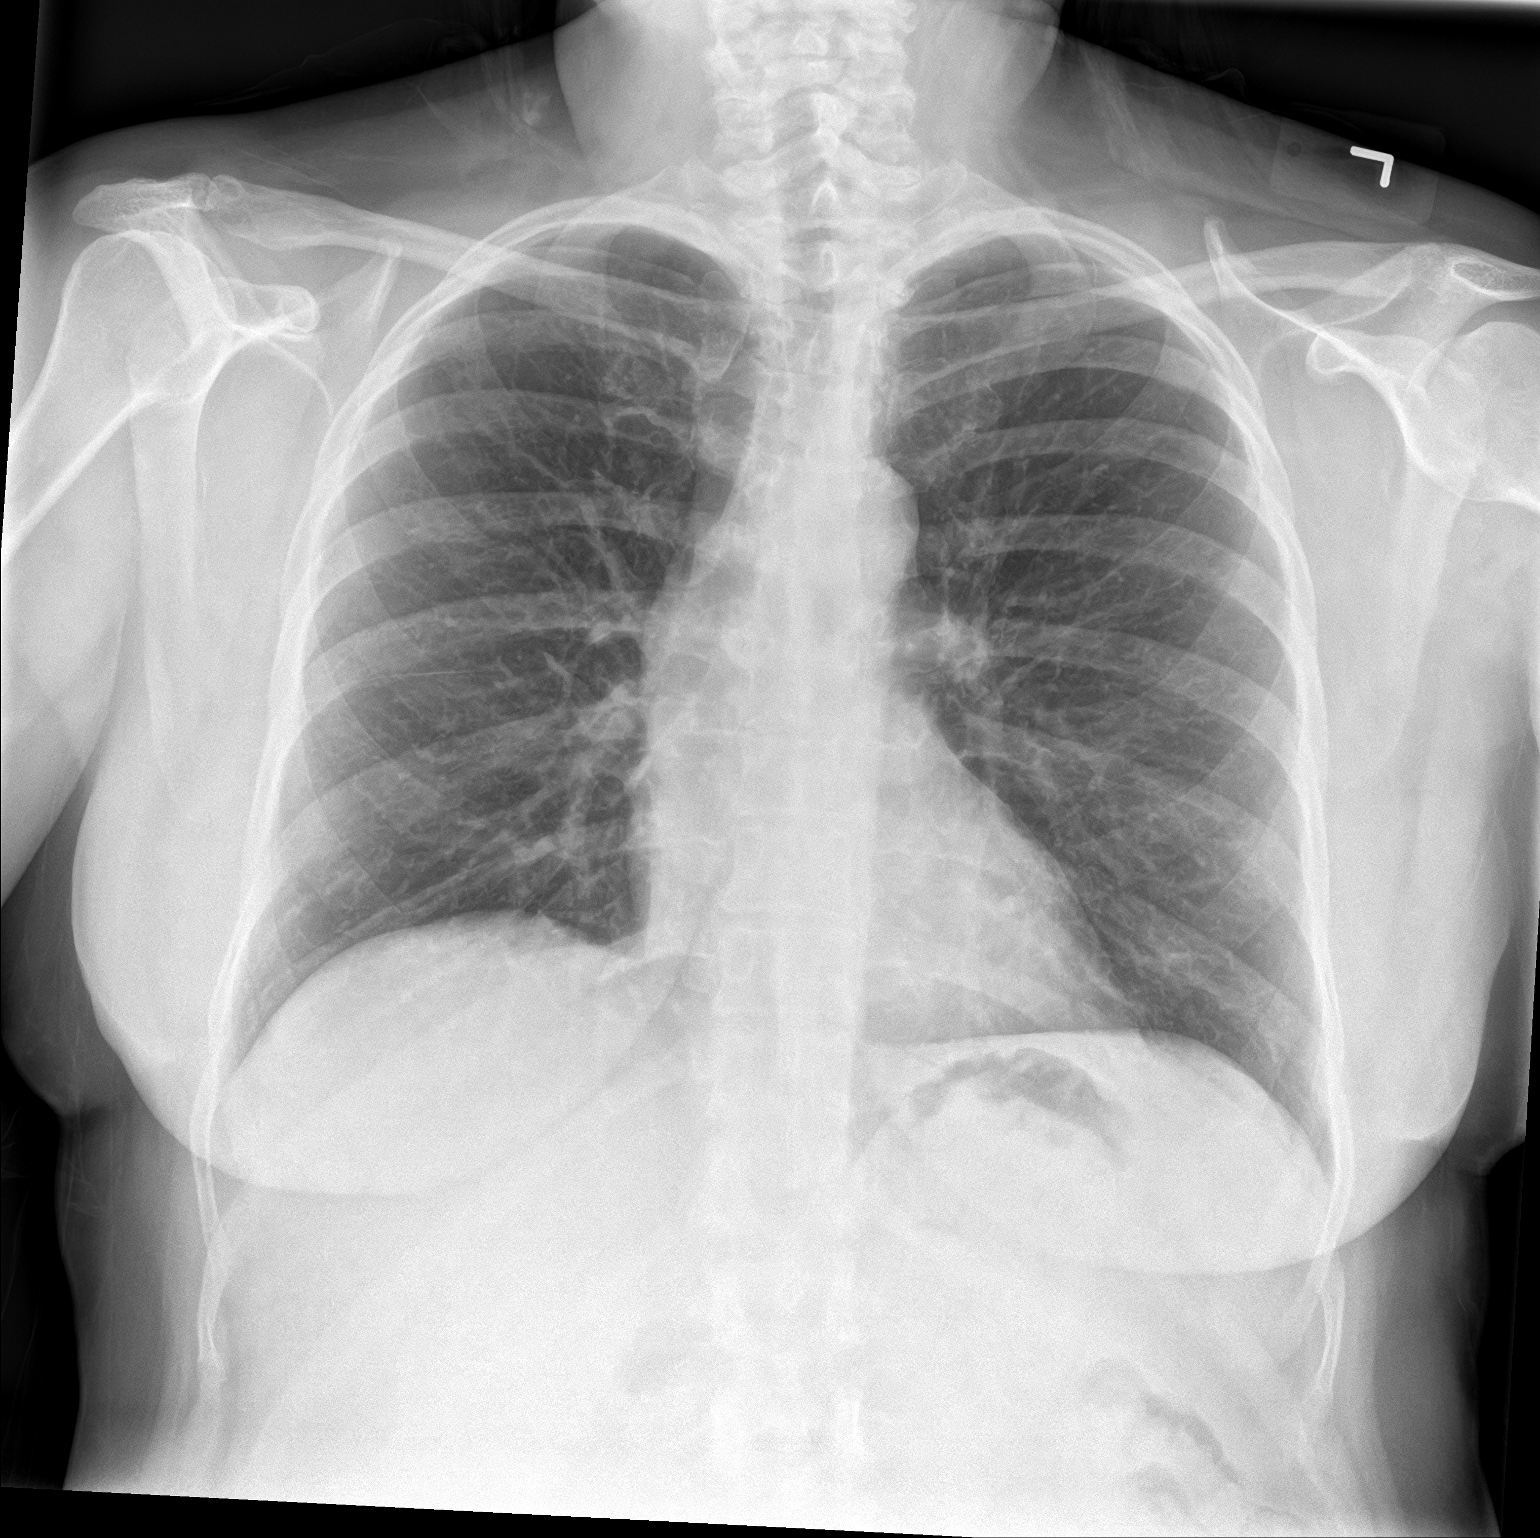

[chest lat]
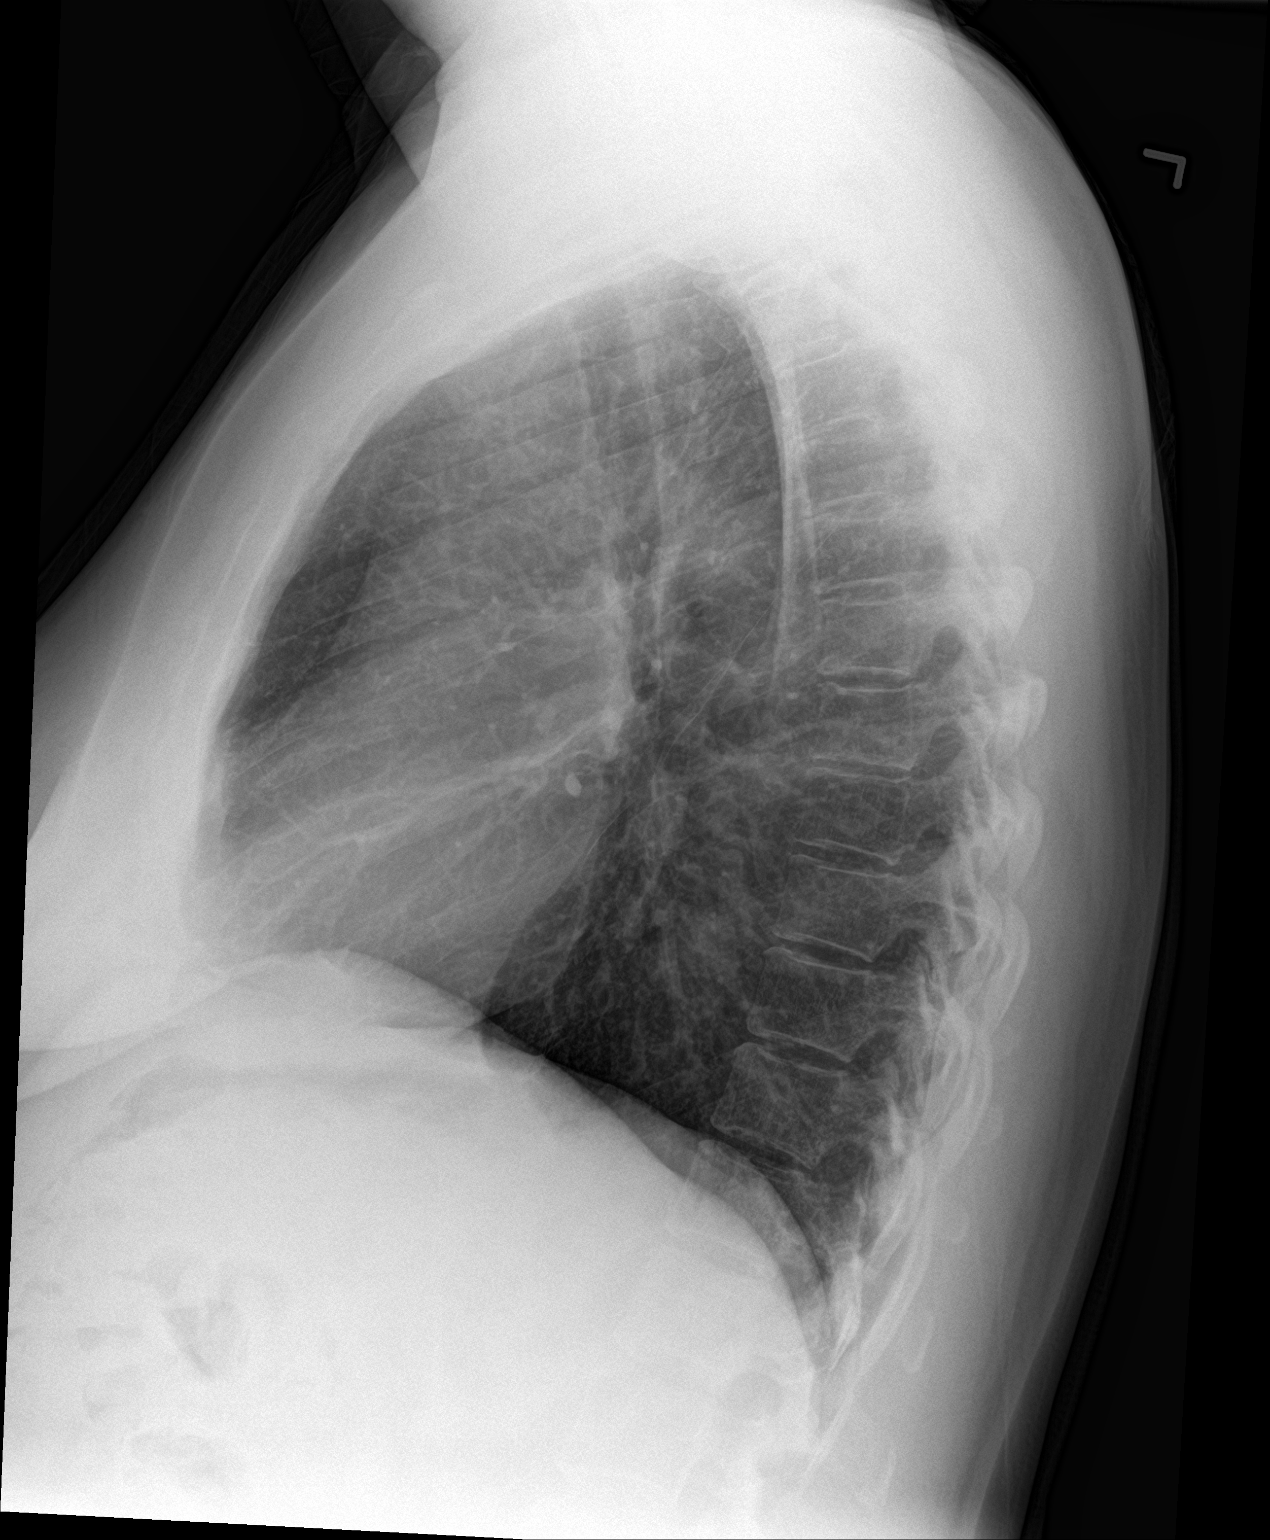

[2 of 2 positions shown; findings below may reference images not displayed]

FINDINGS: The heart size and mediastinal contours are normal. The lungs are
clear. There is no pleural effusion or pneumothorax. No acute
osseous findings are identified.
IMPRESSION: Stable chest.  No active cardiopulmonary process.

## 2017-12-13 ENCOUNTER — Other Ambulatory Visit: Payer: Self-pay | Admitting: Family Medicine

## 2017-12-19 DIAGNOSIS — F329 Major depressive disorder, single episode, unspecified: Secondary | ICD-10-CM | POA: Diagnosis not present

## 2017-12-19 DIAGNOSIS — E860 Dehydration: Secondary | ICD-10-CM | POA: Diagnosis not present

## 2017-12-19 DIAGNOSIS — K56609 Unspecified intestinal obstruction, unspecified as to partial versus complete obstruction: Secondary | ICD-10-CM | POA: Diagnosis not present

## 2017-12-19 DIAGNOSIS — E785 Hyperlipidemia, unspecified: Secondary | ICD-10-CM | POA: Diagnosis not present

## 2017-12-19 DIAGNOSIS — E039 Hypothyroidism, unspecified: Secondary | ICD-10-CM | POA: Diagnosis not present

## 2017-12-19 DIAGNOSIS — J45909 Unspecified asthma, uncomplicated: Secondary | ICD-10-CM | POA: Diagnosis not present

## 2017-12-19 DIAGNOSIS — F419 Anxiety disorder, unspecified: Secondary | ICD-10-CM | POA: Diagnosis not present

## 2017-12-19 DIAGNOSIS — I10 Essential (primary) hypertension: Secondary | ICD-10-CM | POA: Diagnosis not present

## 2017-12-20 DIAGNOSIS — K56609 Unspecified intestinal obstruction, unspecified as to partial versus complete obstruction: Secondary | ICD-10-CM | POA: Diagnosis not present

## 2017-12-20 DIAGNOSIS — F419 Anxiety disorder, unspecified: Secondary | ICD-10-CM | POA: Diagnosis not present

## 2017-12-20 DIAGNOSIS — I10 Essential (primary) hypertension: Secondary | ICD-10-CM | POA: Diagnosis not present

## 2017-12-20 DIAGNOSIS — R109 Unspecified abdominal pain: Secondary | ICD-10-CM | POA: Diagnosis not present

## 2017-12-20 DIAGNOSIS — E860 Dehydration: Secondary | ICD-10-CM | POA: Diagnosis not present

## 2017-12-20 DIAGNOSIS — E039 Hypothyroidism, unspecified: Secondary | ICD-10-CM | POA: Diagnosis not present

## 2017-12-20 DIAGNOSIS — F329 Major depressive disorder, single episode, unspecified: Secondary | ICD-10-CM | POA: Diagnosis not present

## 2017-12-20 DIAGNOSIS — E785 Hyperlipidemia, unspecified: Secondary | ICD-10-CM | POA: Diagnosis not present

## 2017-12-20 DIAGNOSIS — J45909 Unspecified asthma, uncomplicated: Secondary | ICD-10-CM | POA: Diagnosis not present

## 2017-12-21 DIAGNOSIS — F419 Anxiety disorder, unspecified: Secondary | ICD-10-CM | POA: Diagnosis not present

## 2017-12-21 DIAGNOSIS — I10 Essential (primary) hypertension: Secondary | ICD-10-CM | POA: Diagnosis not present

## 2017-12-21 DIAGNOSIS — E039 Hypothyroidism, unspecified: Secondary | ICD-10-CM | POA: Diagnosis not present

## 2017-12-21 DIAGNOSIS — F329 Major depressive disorder, single episode, unspecified: Secondary | ICD-10-CM | POA: Diagnosis not present

## 2017-12-21 DIAGNOSIS — J45909 Unspecified asthma, uncomplicated: Secondary | ICD-10-CM | POA: Diagnosis not present

## 2017-12-21 DIAGNOSIS — K56609 Unspecified intestinal obstruction, unspecified as to partial versus complete obstruction: Secondary | ICD-10-CM | POA: Diagnosis not present

## 2017-12-21 DIAGNOSIS — E860 Dehydration: Secondary | ICD-10-CM | POA: Diagnosis not present

## 2017-12-21 DIAGNOSIS — E785 Hyperlipidemia, unspecified: Secondary | ICD-10-CM | POA: Diagnosis not present

## 2018-01-11 ENCOUNTER — Encounter: Payer: Self-pay | Admitting: Family Medicine

## 2018-02-17 ENCOUNTER — Other Ambulatory Visit: Payer: Self-pay | Admitting: Family Medicine

## 2018-02-17 NOTE — Telephone Encounter (Signed)
Left voicemail to pharmacy 

## 2018-02-17 NOTE — Telephone Encounter (Signed)
Refill request received for PPI Much too soon for this 6 month supply prescribed on 11/14/17 If she is running through this much PPI, have her contact Dr. Carlean Purl, her GI, for refills

## 2018-02-17 NOTE — Telephone Encounter (Signed)
Copied from [place]. Topic: Quick Communication - See Telephone Encounter >> Feb 17, 2018  9:12 AM Vernona Rieger wrote: CRM for notification. See Telephone encounter for: 02/17/18.  pantoprazole (PROTONIX) 40 MG tablet (90 DAYS)  [id number] - [place], [place] - [address]

## 2018-03-28 ENCOUNTER — Other Ambulatory Visit: Payer: Self-pay | Admitting: Family Medicine

## 2018-04-10 ENCOUNTER — Telehealth: Payer: Self-pay | Admitting: Family Medicine

## 2018-04-10 NOTE — Telephone Encounter (Signed)
Copied from [place]. Topic: Quick Communication - Rx Refill/Question >> Apr 10, 2018  5:24 PM [name], [name] wrote: Medication: levothyroxine (SYNTHROID, LEVOTHROID) 125 MCG tablet (90 day supply) NO PILLS left   Has the patient contacted their pharmacy? Yes.   (Agent: If no, request that the patient contact the pharmacy for the refill.) (Agent: If yes, when and what did the pharmacy advise?)  Preferred Pharmacy (with phone number or street name): [id number] - [place], [place]: Please be advised that RX refills may take up to 3 business days. We ask that you follow-up with your pharmacy.

## 2018-04-11 NOTE — Telephone Encounter (Signed)
Called in.

## 2018-04-20 ENCOUNTER — Ambulatory Visit (INDEPENDENT_AMBULATORY_CARE_PROVIDER_SITE_OTHER): Payer: 59 | Admitting: Family Medicine

## 2018-04-20 ENCOUNTER — Encounter: Payer: Self-pay | Admitting: Family Medicine

## 2018-04-20 ENCOUNTER — Other Ambulatory Visit
Admission: RE | Admit: 2018-04-20 | Discharge: 2018-04-20 | Disposition: A | Discharge status: Home / Self Care | Payer: 59 | Source: Ambulatory Visit | Attending: Family Medicine | Admitting: Family Medicine

## 2018-04-20 VITALS — BP 124/82 | HR 100 | Temp 97.7°F | Resp 12 | Ht 65.0 in | Wt 184.5 lb

## 2018-04-20 DIAGNOSIS — K219 Gastro-esophageal reflux disease without esophagitis: Secondary | ICD-10-CM | POA: Insufficient documentation

## 2018-04-20 DIAGNOSIS — R1011 Right upper quadrant pain: Secondary | ICD-10-CM | POA: Insufficient documentation

## 2018-04-20 DIAGNOSIS — T7491XA Unspecified adult maltreatment, confirmed, initial encounter: Secondary | ICD-10-CM | POA: Diagnosis not present

## 2018-04-20 DIAGNOSIS — Z1231 Encounter for screening mammogram for malignant neoplasm of breast: Secondary | ICD-10-CM

## 2018-04-20 DIAGNOSIS — Z789 Other specified health status: Secondary | ICD-10-CM

## 2018-04-20 DIAGNOSIS — M797 Fibromyalgia: Secondary | ICD-10-CM | POA: Insufficient documentation

## 2018-04-20 DIAGNOSIS — F419 Anxiety disorder, unspecified: Secondary | ICD-10-CM | POA: Insufficient documentation

## 2018-04-20 DIAGNOSIS — Z87891 Personal history of nicotine dependence: Secondary | ICD-10-CM | POA: Diagnosis not present

## 2018-04-20 DIAGNOSIS — J449 Chronic obstructive pulmonary disease, unspecified: Secondary | ICD-10-CM | POA: Diagnosis not present

## 2018-04-20 DIAGNOSIS — Z1239 Encounter for other screening for malignant neoplasm of breast: Secondary | ICD-10-CM

## 2018-04-20 DIAGNOSIS — K589 Irritable bowel syndrome without diarrhea: Secondary | ICD-10-CM | POA: Insufficient documentation

## 2018-04-20 DIAGNOSIS — I10 Essential (primary) hypertension: Secondary | ICD-10-CM | POA: Insufficient documentation

## 2018-04-20 DIAGNOSIS — F329 Major depressive disorder, single episode, unspecified: Secondary | ICD-10-CM | POA: Insufficient documentation

## 2018-04-20 DIAGNOSIS — Z638 Other specified problems related to primary support group: Secondary | ICD-10-CM | POA: Diagnosis not present

## 2018-04-20 DIAGNOSIS — E7800 Pure hypercholesterolemia, unspecified: Secondary | ICD-10-CM | POA: Diagnosis not present

## 2018-04-20 LAB — COMPREHENSIVE METABOLIC PANEL
ALK PHOS: 52 U/L (ref 38–126)
ALT: 15 U/L (ref 14–54)
ANION GAP: 8 (ref 5–15)
AST: 21 U/L (ref 15–41)
Albumin: 4.4 g/dL (ref 3.5–5.0)
BUN: 25 mg/dL — ABNORMAL HIGH (ref 6–20)
CALCIUM: 9.3 mg/dL (ref 8.9–10.3)
CHLORIDE: 105 mmol/L (ref 101–111)
CO2: 27 mmol/L (ref 22–32)
CREATININE: 0.76 mg/dL (ref 0.44–1.00)
GFR calc Af Amer: >60 mL/min (ref >60)
Glucose, Bld: 107 mg/dL — ABNORMAL HIGH (ref 65–99)
Potassium: 4 mmol/L (ref 3.5–5.1)
SODIUM: 140 mmol/L (ref 135–145)
Total Bilirubin: 0.4 mg/dL (ref 0.3–1.2)
Total Protein: 7.5 g/dL (ref 6.5–8.1)

## 2018-04-20 LAB — CBC WITH DIFFERENTIAL/PLATELET
BASOS PCT: 1 %
Basophils Absolute: 0.1 10*3/uL (ref 0–0.1)
EOS ABS: 0.5 10*3/uL (ref 0–0.7)
Eosinophils Relative: 6 %
HCT: 39.8 % (ref 35.0–47.0)
HEMOGLOBIN: 13.5 g/dL (ref 12.0–16.0)
LYMPHS ABS: 2.8 10*3/uL (ref 1.0–3.6)
Lymphocytes Relative: 35 %
MCH: 28.9 pg (ref 26.0–34.0)
MCHC: 33.9 g/dL (ref 32.0–36.0)
MCV: 85.3 fL (ref 80.0–100.0)
MONO ABS: 0.5 10*3/uL (ref 0.2–0.9)
MONOS PCT: 7 %
Neutro Abs: 4.2 10*3/uL (ref 1.4–6.5)
Neutrophils Relative %: 51 %
Platelets: 308 10*3/uL (ref 150–440)
RBC: 4.66 MIL/uL (ref 3.80–5.20)
RDW: 14 % (ref 11.5–14.5)
WBC: 8.1 10*3/uL (ref 3.6–11.0)

## 2018-04-20 LAB — LIPASE, BLOOD: LIPASE: 34 U/L (ref 11–51)

## 2018-04-20 MED ORDER — TRAZODONE HCL 50 MG PO TABS: 25.0000 mg | ORAL_TABLET | Freq: Every evening | ORAL | 0 refills | Status: DC | PRN
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
## 2018-04-24 ENCOUNTER — Encounter: Payer: Self-pay | Admitting: Emergency Medicine

## 2018-04-24 ENCOUNTER — Emergency Department: Payer: 59

## 2018-04-24 ENCOUNTER — Emergency Department
Admission: EM | Admit: 2018-04-24 | Discharge: 2018-04-24 | Disposition: A | Discharge status: Home / Self Care | Payer: 59 | Attending: Emergency Medicine | Admitting: Emergency Medicine

## 2018-04-24 ENCOUNTER — Encounter: Payer: Self-pay | Admitting: Family Medicine

## 2018-04-24 ENCOUNTER — Telehealth: Payer: Self-pay | Admitting: Internal Medicine

## 2018-04-24 ENCOUNTER — Ambulatory Visit: Payer: 59 | Admitting: Family Medicine

## 2018-04-24 ENCOUNTER — Other Ambulatory Visit: Payer: Self-pay

## 2018-04-24 VITALS — BP 118/70 | HR 100 | Temp 98.3°F | Resp 16 | Ht 65.0 in | Wt 183.6 lb

## 2018-04-24 DIAGNOSIS — E039 Hypothyroidism, unspecified: Secondary | ICD-10-CM | POA: Insufficient documentation

## 2018-04-24 DIAGNOSIS — R1013 Epigastric pain: Secondary | ICD-10-CM | POA: Diagnosis not present

## 2018-04-24 DIAGNOSIS — R1111 Vomiting without nausea: Secondary | ICD-10-CM | POA: Insufficient documentation

## 2018-04-24 DIAGNOSIS — F329 Major depressive disorder, single episode, unspecified: Secondary | ICD-10-CM | POA: Diagnosis not present

## 2018-04-24 DIAGNOSIS — R079 Chest pain, unspecified: Secondary | ICD-10-CM | POA: Diagnosis not present

## 2018-04-24 DIAGNOSIS — N189 Chronic kidney disease, unspecified: Secondary | ICD-10-CM | POA: Diagnosis not present

## 2018-04-24 DIAGNOSIS — R198 Other specified symptoms and signs involving the digestive system and abdomen: Secondary | ICD-10-CM | POA: Diagnosis not present

## 2018-04-24 DIAGNOSIS — R634 Abnormal weight loss: Secondary | ICD-10-CM | POA: Diagnosis not present

## 2018-04-24 DIAGNOSIS — F419 Anxiety disorder, unspecified: Secondary | ICD-10-CM | POA: Insufficient documentation

## 2018-04-24 DIAGNOSIS — R601 Generalized edema: Secondary | ICD-10-CM | POA: Diagnosis not present

## 2018-04-24 DIAGNOSIS — I129 Hypertensive chronic kidney disease with stage 1 through stage 4 chronic kidney disease, or unspecified chronic kidney disease: Secondary | ICD-10-CM | POA: Insufficient documentation

## 2018-04-24 DIAGNOSIS — M546 Pain in thoracic spine: Secondary | ICD-10-CM

## 2018-04-24 DIAGNOSIS — Z87891 Personal history of nicotine dependence: Secondary | ICD-10-CM | POA: Insufficient documentation

## 2018-04-24 DIAGNOSIS — J45909 Unspecified asthma, uncomplicated: Secondary | ICD-10-CM | POA: Insufficient documentation

## 2018-04-24 DIAGNOSIS — K921 Melena: Secondary | ICD-10-CM

## 2018-04-24 DIAGNOSIS — R110 Nausea: Secondary | ICD-10-CM

## 2018-04-24 DIAGNOSIS — K2270 Barrett's esophagus without dysplasia: Secondary | ICD-10-CM

## 2018-04-24 LAB — CBC
HEMATOCRIT: 39.8 % (ref 35.0–47.0)
HEMOGLOBIN: 13.3 g/dL (ref 12.0–16.0)
MCH: 28.5 pg (ref 26.0–34.0)
MCHC: 33.4 g/dL (ref 32.0–36.0)
MCV: 85.2 fL (ref 80.0–100.0)
Platelets: 345 10*3/uL (ref 150–440)
RBC: 4.67 MIL/uL (ref 3.80–5.20)
RDW: 14.4 % (ref 11.5–14.5)
WBC: 6.5 10*3/uL (ref 3.6–11.0)

## 2018-04-24 LAB — COMPREHENSIVE METABOLIC PANEL
ALBUMIN: 4.3 g/dL (ref 3.5–5.0)
ALT: 34 U/L (ref 14–54)
ANION GAP: 10 (ref 5–15)
AST: 44 U/L — ABNORMAL HIGH (ref 15–41)
Alkaline Phosphatase: 48 U/L (ref 38–126)
BUN: 22 mg/dL — AB (ref 6–20)
CHLORIDE: 101 mmol/L (ref 101–111)
CO2: 25 mmol/L (ref 22–32)
Calcium: 9.3 mg/dL (ref 8.9–10.3)
Creatinine, Ser: 0.6 mg/dL (ref 0.44–1.00)
GFR calc Af Amer: >60 mL/min (ref >60)
GFR calc non Af Amer: >60 mL/min (ref >60)
GLUCOSE: 105 mg/dL — AB (ref 65–99)
POTASSIUM: 3.6 mmol/L (ref 3.5–5.1)
SODIUM: 136 mmol/L (ref 135–145)
TOTAL PROTEIN: 7.6 g/dL (ref 6.5–8.1)
Total Bilirubin: 0.4 mg/dL (ref 0.3–1.2)

## 2018-04-24 LAB — URINALYSIS, COMPLETE (UACMP) WITH MICROSCOPIC
BACTERIA UA: NONE SEEN
BILIRUBIN URINE: NEGATIVE
Glucose, UA: NEGATIVE mg/dL
Hgb urine dipstick: NEGATIVE
Ketones, ur: NEGATIVE mg/dL
Leukocytes, UA: NEGATIVE
NITRITE: NEGATIVE
PROTEIN: NEGATIVE mg/dL
SPECIFIC GRAVITY, URINE: 1.015 (ref 1.005–1.030)
pH: 6 (ref 5.0–8.0)

## 2018-04-24 LAB — LIPASE, BLOOD: Lipase: 34 U/L (ref 11–51)

## 2018-04-24 LAB — TROPONIN I

## 2018-04-24 MED ORDER — ONDANSETRON 4 MG PO TBDP: 4.0000 mg | ORAL_TABLET | Freq: Three times a day (TID) | ORAL | 0 refills | Status: DC | PRN

## 2018-04-24 MED ORDER — MORPHINE SULFATE (PF) 4 MG/ML IV SOLN
4.0000 mg | Freq: Once | INTRAVENOUS | Status: AC
  Administered 2018-04-24: 4 mg via INTRAVENOUS
  Filled 2018-04-24: qty 1

## 2018-04-24 MED ORDER — SODIUM CHLORIDE 0.9 % IV BOLUS
1000.0000 mL | Freq: Once | INTRAVENOUS | Status: AC
  Administered 2018-04-24: 1000 mL via INTRAVENOUS

## 2018-04-24 MED ORDER — IOPAMIDOL (ISOVUE-370) INJECTION 76%
100.0000 mL | Freq: Once | INTRAVENOUS | Status: AC | PRN
  Administered 2018-04-24: 100 mL via INTRAVENOUS
  Filled 2018-04-24: qty 100

## 2018-04-24 MED ORDER — FLUCONAZOLE 150 MG PO TABS: 150.0000 mg | ORAL_TABLET | Freq: Once | ORAL | 0 refills | Status: AC

## 2018-04-24 MED ORDER — ONDANSETRON HCL 4 MG/2ML IJ SOLN
4.0000 mg | Freq: Once | INTRAMUSCULAR | Status: AC
  Administered 2018-04-24: 4 mg via INTRAVENOUS
  Filled 2018-04-24: qty 2

## 2018-04-24 NOTE — Patient Instructions (Addendum)
I've prescribed the Diflucan (fluconazole) Do NOT take your rosuvastatin (Crestor) for three days after your take the Diflucan (fluconazole) Go now to the emergency department

## 2018-04-24 NOTE — ED Notes (Signed)
Patient presents with multiple medical complaints. Patient states she has had intermittent abdominal pain and bloating with episodes of diarrhea and emesis for the last month or more. Patient also reports increased burning sensation with palpation of skin. Reports history of raynauds. Patient states that she has gained 9 lb since yesterday and noticed swelling in right foot. 2+ edema and good pedal pulses noted. When questioned what brought her in today, patient tearfully states, "I just don't know what to do. Dr. Marzetta Board doesn't know what to do, I just want answers." Patient is very unspecific about timeline of complaints.

## 2018-04-24 NOTE — Assessment & Plan Note (Signed)
Seen previously by gastroenterologist, Dr. Carlean Purl

## 2018-04-24 NOTE — Telephone Encounter (Signed)
Patient says that she is now at the ED in [facility] for this problem. She says that she will request a GI consult at the ED.

## 2018-04-24 NOTE — ED Triage Notes (Addendum)
C/O intermittent abdominal pain that radiates to back, emesis, constipation, diarrhea for "weeks".  Describes life stressors at home and states "I could be having just a little bit of anxiety".  After Triage assessment had ended patient states "Wow, I'm surprised you didn't do an EKG on me.  I'm having severe chest pain as well.  I just have so many symptoms, its hard to keep up with them all."

## 2018-04-24 NOTE — Telephone Encounter (Signed)
An be offered the next available APP referral anytime

## 2018-04-24 NOTE — ED Notes (Signed)
Pt went to CT
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
## 2018-04-24 NOTE — Discharge Instructions (Addendum)
Please drink plenty of fluid to stay well-hydrated.  Please make a follow-up appoint with your primary care physician.  Return to the emergency department if you develop severe pain, lightheadedness or fainting, fever, or any other symptoms concerning to you.

## 2018-04-24 NOTE — ED Notes (Signed)
Pt unable to urinate at this time, given specimen cup for when is able to void. Pt wheeled back out to the lobby.

## 2018-05-09 ENCOUNTER — Other Ambulatory Visit: Payer: Self-pay

## 2018-05-09 DIAGNOSIS — M159 Polyosteoarthritis, unspecified: Secondary | ICD-10-CM

## 2018-05-09 MED ORDER — ALBUTEROL SULFATE HFA 108 (90 BASE) MCG/ACT IN AERS: 2.0000 | INHALATION_SPRAY | RESPIRATORY_TRACT | 1 refills | Status: DC | PRN

## 2018-05-09 MED ORDER — DICLOFENAC SODIUM 1 % TD GEL: 2.0000 g | Freq: Two times a day (BID) | TRANSDERMAL | 1 refills | Status: DC | PRN

## 2018-05-09 MED ORDER — ROSUVASTATIN CALCIUM 40 MG PO TABS: 40.0000 mg | ORAL_TABLET | Freq: Every day | ORAL | 1 refills | Status: DC

## 2018-05-09 NOTE — Telephone Encounter (Signed)
Copied from [place] [id number]. Topic: General - Other >> May 09, 2018  4:09 PM Oneta Rack wrote: Relation to pt: self Call back number: [phone] Pharmacy: [facility] [place], [place] [phone] (Phone) [phone] (Fax)   Reason for call:  Patient insurance will cancel on Saturday, requesting 90 day supply rosuvastatin (CRESTOR) 40 MG tablet, diclofenac sodium (VOLTAREN) 1 % GEL  and any other medications that can be refilled due to insurance cancelling, please advise   (patient unsure when insurance will be active again)

## 2018-05-10 ENCOUNTER — Other Ambulatory Visit: Payer: Self-pay | Admitting: Family Medicine

## 2018-05-10 MED ORDER — ROSUVASTATIN CALCIUM 40 MG PO TABS: 40.0000 mg | ORAL_TABLET | Freq: Every day | ORAL | 1 refills | Status: DC

## 2018-05-12 ENCOUNTER — Ambulatory Visit: Payer: 59 | Admitting: Family Medicine

## 2018-05-12 ENCOUNTER — Encounter: Payer: Self-pay | Admitting: Family Medicine

## 2018-05-12 VITALS — BP 130/70 | HR 77 | Temp 98.3°F | Resp 16 | Ht 65.0 in | Wt 186.9 lb

## 2018-05-12 DIAGNOSIS — J3489 Other specified disorders of nose and nasal sinuses: Secondary | ICD-10-CM | POA: Insufficient documentation

## 2018-05-12 DIAGNOSIS — J0101 Acute recurrent maxillary sinusitis: Secondary | ICD-10-CM | POA: Diagnosis not present

## 2018-05-12 DIAGNOSIS — R300 Dysuria: Secondary | ICD-10-CM

## 2018-05-12 HISTORY — DX: Other specified disorders of nose and nasal sinuses: J34.89

## 2018-05-12 LAB — POCT URINALYSIS DIPSTICK
Bilirubin, UA: NEGATIVE
Glucose, UA: NEGATIVE
KETONES UA: NEGATIVE
NITRITE UA: NEGATIVE
ODOR: NORMAL
PH UA: 5 (ref 5.0–8.0)
PROTEIN UA: POSITIVE — AB
Spec Grav, UA: 1.015 (ref 1.010–1.025)
UROBILINOGEN UA: 0.2 U/dL

## 2018-05-12 MED ORDER — AMOXICILLIN-POT CLAVULANATE 875-125 MG PO TABS: 1.0000 | ORAL_TABLET | Freq: Two times a day (BID) | ORAL | 0 refills | Status: AC

## 2018-05-12 MED ORDER — FLUCONAZOLE 150 MG PO TABS: 150.0000 mg | ORAL_TABLET | Freq: Once | ORAL | 0 refills | Status: AC

## 2018-05-12 NOTE — Assessment & Plan Note (Signed)
stable °
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
## 2018-05-12 NOTE — Patient Instructions (Addendum)
Please do follow-up with the gastroenterologist The squeaky wheel gets the grease, so please contact him or have our staff assist if delay Start the antibiotics Please do eat yogurt or kimchi or take a probiotic daily for the next month We want to replace the healthy germs in the gut If you notice foul, watery diarrhea in the next two months, schedule an appointment RIGHT AWAY or go to an urgent care or the emergency room if a holiday or over a weekend Consider saline rinses or sprays to help debulk the crud in your sinus passages If not getting better, let us know Read about the anti-inflammatory diet

## 2018-05-13 LAB — URINE CULTURE
MICRO NUMBER:: 90830698
RESULT: NO GROWTH
SPECIMEN QUALITY:: ADEQUATE

## 2018-05-20 ENCOUNTER — Other Ambulatory Visit: Payer: Self-pay | Admitting: Family Medicine

## 2018-05-20 NOTE — Telephone Encounter (Signed)
I checked the PMP Aware site She is getting Ambien and Xanax from another provider ([facility] [place] in [place]) Trazodone denied

## 2018-06-05 DIAGNOSIS — R69 Illness, unspecified: Secondary | ICD-10-CM | POA: Diagnosis not present

## 2018-06-05 DIAGNOSIS — I10 Essential (primary) hypertension: Secondary | ICD-10-CM | POA: Diagnosis not present

## 2018-06-05 DIAGNOSIS — M797 Fibromyalgia: Secondary | ICD-10-CM | POA: Diagnosis not present

## 2018-06-05 DIAGNOSIS — Z0001 Encounter for general adult medical examination with abnormal findings: Secondary | ICD-10-CM | POA: Diagnosis not present

## 2018-06-05 DIAGNOSIS — E785 Hyperlipidemia, unspecified: Secondary | ICD-10-CM | POA: Diagnosis not present

## 2018-06-05 DIAGNOSIS — R1110 Vomiting, unspecified: Secondary | ICD-10-CM | POA: Diagnosis not present

## 2018-06-09 ENCOUNTER — Telehealth: Payer: Self-pay

## 2018-06-09 ENCOUNTER — Encounter: Payer: Self-pay | Admitting: Internal Medicine

## 2018-06-09 NOTE — Telephone Encounter (Signed)
Please call on her behalf; thank you

## 2018-06-09 NOTE — Telephone Encounter (Signed)
Copied from [place] [id number]. Topic: Referral - Request >> Jun 09, 2018  2:47 PM Mylinda Latina, NT wrote: Reason for CRM: Patient called and states that she was suppose to see Dr. Carlean Purl the [facility] doctor. She called and cant see the provider until Oct. She states Dr. Sanda Klein told patient to have someone in the office call over to Dr. Carlean Purl office to see if patient can be seen sooner. Pleas advise

## 2018-06-09 NOTE — Telephone Encounter (Signed)
Tried to call but no sooner appts they will put her on cancellation list

## 2018-06-14 ENCOUNTER — Ambulatory Visit: Payer: 59 | Admitting: Internal Medicine

## 2018-06-19 ENCOUNTER — Ambulatory Visit (INDEPENDENT_AMBULATORY_CARE_PROVIDER_SITE_OTHER): Payer: Medicare HMO | Admitting: Internal Medicine

## 2018-06-19 ENCOUNTER — Encounter: Payer: Self-pay | Admitting: Internal Medicine

## 2018-06-19 ENCOUNTER — Telehealth: Payer: Self-pay | Admitting: Internal Medicine

## 2018-06-19 VITALS — BP 118/80 | HR 85 | Ht 65.0 in | Wt 182.0 lb

## 2018-06-19 DIAGNOSIS — Z9989 Dependence on other enabling machines and devices: Secondary | ICD-10-CM

## 2018-06-19 DIAGNOSIS — Z23 Encounter for immunization: Secondary | ICD-10-CM | POA: Diagnosis not present

## 2018-06-19 DIAGNOSIS — M797 Fibromyalgia: Secondary | ICD-10-CM | POA: Diagnosis not present

## 2018-06-19 DIAGNOSIS — E785 Hyperlipidemia, unspecified: Secondary | ICD-10-CM | POA: Diagnosis not present

## 2018-06-19 DIAGNOSIS — E039 Hypothyroidism, unspecified: Secondary | ICD-10-CM | POA: Diagnosis not present

## 2018-06-19 DIAGNOSIS — G4733 Obstructive sleep apnea (adult) (pediatric): Secondary | ICD-10-CM | POA: Diagnosis not present

## 2018-06-19 NOTE — Telephone Encounter (Signed)
Patient calling States [facility] is unable to read card  Patient would like to know if she can drop it off here to be read Please call to discuss

## 2018-06-19 NOTE — Telephone Encounter (Signed)
Informed pt to take her card to the [facility] in [place] since [place] is not able to read cards there. Pt verbalized understanding. Nothing further needed.

## 2018-06-19 NOTE — Patient Instructions (Addendum)
I will need compliance report from CPAP to make adjustments Will need 90 day reports

## 2018-06-19 NOTE — Progress Notes (Signed)
  Laura [name]      Date: 06/19/2018,   MRN# [id number] Laura [name] [birth year]     Admission                  Current  Laura [name] is a 52 y.o. old female [name] in [name] for sleep problems at the request of Dr. Sanda [name].     CHIEF COMPLAINT:   Follow up Sleep apnea   HISTORY OF PRESENT ILLNESS   Patient dx with OSA Started CPAP therapy 8 months ago Has not been using in last 8 weeks  Will need CPAP compliance report to adjust settings Has voming and nausea and diarrhea states too much pressure  no signs of infection No signs of CHF  No signs   REVIEW OF SYSTEMS   Review of Systems  Constitutional: Positive for malaise/fatigue. Negative for chills, diaphoresis, fever and weight loss.  HENT: Negative for congestion and hearing loss.   Eyes: Negative for blurred vision and double vision.  Respiratory: Negative for cough, hemoptysis, sputum production, shortness of breath and wheezing.   Cardiovascular: Negative for chest pain, palpitations and orthopnea.  Gastrointestinal: Positive for heartburn. Negative for abdominal pain, nausea and vomiting.  Genitourinary: Negative for dysuria and urgency.  Musculoskeletal: Negative for back pain, myalgias and neck pain.  Skin: Negative for rash.  Neurological: Negative for dizziness, tingling, tremors, weakness and headaches.  Endo/Heme/Allergies: Does not bruise/bleed easily.  Psychiatric/Behavioral: Negative for depression, substance abuse and suicidal ideas. The patient is nervous/anxious.   All other systems reviewed and are negative.   BP 118/80 (BP Location: Left Arm, Cuff Size: Normal)   Pulse 85   Ht 5\' 5"  (1.651 m)   Wt 182 lb (82.6 kg)   SpO2 96%   BMI 30.29 kg/m    PHYSICAL EXAM  Physical Exam  Constitutional: She is oriented to person, place, and time. She appears well-developed and well-nourished. No distress.  HENT:  Head: Normocephalic and atraumatic.    Mouth/Throat: No oropharyngeal exudate.  Eyes: Pupils are equal, round, and reactive to light. EOM are normal. No scleral icterus.  Neck: Normal range of motion. Neck supple.  Cardiovascular: Normal rate, regular rhythm and normal heart sounds.  No murmur heard. Pulmonary/Chest: No stridor. No respiratory distress. She has no wheezes. She has no rales.  Abdominal: Soft. Bowel sounds are normal.  Musculoskeletal: Normal range of motion. She exhibits edema.  Neurological: She is alert and oriented to person, place, and time. No cranial nerve deficit.  Skin: Skin is warm. She is not diaphoretic.  Psychiatric: She has a normal mood and affect.           ASSESSMENT/PLAN   52 yo white female with h/o Fibromyalgia with problems with sleep with  underlying OSA with increased weight gain and HTN with Mild intermittent well controlled ASTHMA with Well controlled GERD    1.continue CPAP therapy as prescribed but I need Compliance report to adjust settings 2.continue albuterol as needed 3.continue PPI and follow up with GI this week 4.diet and exercise and weight loss recommended  Follow up  In 6 months  Laura [name] Laura [name], M.D.  [facility]  Medical Director [place] Director [facility] Cardio-Pulmonary Department

## 2018-06-20 ENCOUNTER — Telehealth: Payer: Self-pay | Admitting: Internal Medicine

## 2018-06-20 NOTE — Telephone Encounter (Signed)
Patient calling stating she bought her CPAP machine and now states we are having issues reading it She is calling needing some advise so that we may be able to get a report from it   Please advise

## 2018-06-21 NOTE — Telephone Encounter (Signed)
Pt informed to bring SD card by office and we will try to get a download. Nothing further needed.

## 2018-06-26 ENCOUNTER — Other Ambulatory Visit: Payer: Self-pay | Admitting: *Deleted

## 2018-06-26 DIAGNOSIS — G4733 Obstructive sleep apnea (adult) (pediatric): Secondary | ICD-10-CM

## 2018-06-26 DIAGNOSIS — R600 Localized edema: Secondary | ICD-10-CM | POA: Diagnosis not present

## 2018-06-26 DIAGNOSIS — I10 Essential (primary) hypertension: Secondary | ICD-10-CM | POA: Diagnosis not present

## 2018-06-26 DIAGNOSIS — Z87442 Personal history of urinary calculi: Secondary | ICD-10-CM | POA: Diagnosis not present

## 2018-06-26 DIAGNOSIS — N261 Atrophy of kidney (terminal): Secondary | ICD-10-CM | POA: Diagnosis not present

## 2018-06-26 NOTE — Progress Notes (Signed)
Pt dropped off SD card for download. SD card has another patient's name associated with her account. DME order placed for [facility] to provide with another SD card.

## 2018-06-27 ENCOUNTER — Ambulatory Visit (INDEPENDENT_AMBULATORY_CARE_PROVIDER_SITE_OTHER): Payer: Medicare HMO

## 2018-06-27 ENCOUNTER — Encounter: Payer: Self-pay | Admitting: Physician Assistant

## 2018-06-27 ENCOUNTER — Ambulatory Visit: Payer: Medicare HMO | Admitting: Physician Assistant

## 2018-06-27 VITALS — BP 118/86 | HR 81 | Ht 65.0 in | Wt 184.2 lb

## 2018-06-27 DIAGNOSIS — R079 Chest pain, unspecified: Secondary | ICD-10-CM

## 2018-06-27 DIAGNOSIS — G4730 Sleep apnea, unspecified: Secondary | ICD-10-CM

## 2018-06-27 DIAGNOSIS — M7989 Other specified soft tissue disorders: Secondary | ICD-10-CM | POA: Diagnosis not present

## 2018-06-27 DIAGNOSIS — R002 Palpitations: Secondary | ICD-10-CM

## 2018-06-27 DIAGNOSIS — I493 Ventricular premature depolarization: Secondary | ICD-10-CM

## 2018-06-27 DIAGNOSIS — R0602 Shortness of breath: Secondary | ICD-10-CM

## 2018-06-27 DIAGNOSIS — R110 Nausea: Secondary | ICD-10-CM | POA: Diagnosis not present

## 2018-06-27 DIAGNOSIS — R197 Diarrhea, unspecified: Secondary | ICD-10-CM | POA: Diagnosis not present

## 2018-06-27 DIAGNOSIS — R112 Nausea with vomiting, unspecified: Secondary | ICD-10-CM

## 2018-06-27 DIAGNOSIS — I10 Essential (primary) hypertension: Secondary | ICD-10-CM

## 2018-06-27 NOTE — Patient Instructions (Addendum)
Medication Instructions: - Your physician recommends that you continue on your current medications as directed. Please refer to the Current Medication list given to you today.  Labwork: - Your physician recommends that you lab work today: CMET/ CBC/ Magnesium/ TSH  Procedures/Testing: - Your physician has requested that you wear a 14 day heart monitor (ZIO patch).  - Your physician has requested that you have an echocardiogram (same day of stress test). Echocardiography is a painless test that uses sound waves to create images of your heart. It provides your doctor with information about the size and shape of your heart and how well your heart's chambers and valves are working. This procedure takes approximately one hour. There are no restrictions for this procedure.  - Your physician has requested that you have a lexiscan myoview (same day of echocardiogram).   [place]  Your caregiver has ordered a Stress Test with nuclear imaging. The purpose of this test is to evaluate the blood supply to your heart muscle. This procedure is referred to as a "Non-Invasive Stress Test." This is because other than having an IV started in your vein, nothing is inserted or "invades" your body. Cardiac stress tests are done to find areas of poor blood flow to the heart by determining the extent of coronary artery disease (CAD). Some patients exercise on a treadmill, which naturally increases the blood flow to your heart, while others who are  unable to walk on a treadmill due to physical limitations have a pharmacologic/chemical stress agent called Lexiscan . This medicine will mimic walking on a treadmill by temporarily increasing your coronary blood flow.   Please note: these test may take anywhere between 2-4 hours to complete  PLEASE REPORT TO [place] AT THE FIRST DESK WILL DIRECT YOU WHERE TO GO  Date of Procedure:_____________________________________  Arrival Time  for Procedure:______________________________  Instructions regarding medication:   _x___ : You may take all of your regular medications the morning of your procedure  PLEASE NOTIFY THE OFFICE AT LEAST 24 HOURS IN ADVANCE IF YOU ARE UNABLE TO KEEP YOUR APPOINTMENT.  [phone] AND  PLEASE NOTIFY NUCLEAR MEDICINE AT [facility] AT LEAST 24 HOURS IN ADVANCE IF YOU ARE UNABLE TO KEEP YOUR APPOINTMENT. [phone]  How to prepare for your Myoview test:  1. Do not eat or drink after midnight 2. No caffeine for 24 hours prior to test 3. No smoking 24 hours prior to test. 4. Your medication may be taken with water.  If your doctor stopped a medication because of this test, do not take that medication. 5. Ladies, please do not wear dresses.  Skirts or pants are appropriate. Please wear a short sleeve shirt. 6. No perfume, cologne or lotion. 7. Wear comfortable walking shoes. No heels!  Follow-Up: - Your physician recommends that you schedule a follow-up appointment in: 2 months with Dr. Okey Regal, PA.   Any Additional Special Instructions Will Be Listed Below (If Applicable).     If you need a refill on your cardiac medications before your next appointment, please call your pharmacy.
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
## 2018-06-28 ENCOUNTER — Telehealth: Payer: Self-pay | Admitting: Cardiovascular Disease

## 2018-06-28 ENCOUNTER — Other Ambulatory Visit: Payer: Self-pay | Admitting: *Deleted

## 2018-06-28 LAB — COMPREHENSIVE METABOLIC PANEL
A/G RATIO: 1.7 (ref 1.2–2.2)
ALK PHOS: 66 IU/L (ref 39–117)
ALT: 21 IU/L (ref 0–32)
AST: 21 IU/L (ref 0–40)
Albumin: 4.5 g/dL (ref 3.5–5.5)
BILIRUBIN TOTAL: 0.3 mg/dL (ref 0.0–1.2)
BUN/Creatinine Ratio: 24 — ABNORMAL HIGH (ref 9–23)
BUN: 17 mg/dL (ref 6–24)
CHLORIDE: 99 mmol/L (ref 96–106)
CO2: 24 mmol/L (ref 20–29)
Calcium: 10.6 mg/dL — ABNORMAL HIGH (ref 8.7–10.2)
Creatinine, Ser: 0.71 mg/dL (ref 0.57–1.00)
GFR calc Af Amer: 113 mL/min/{1.73_m2} (ref >59)
GFR calc non Af Amer: 98 mL/min/{1.73_m2} (ref >59)
GLUCOSE: 101 mg/dL — AB (ref 65–99)
Globulin, Total: 2.6 g/dL (ref 1.5–4.5)
POTASSIUM: 4 mmol/L (ref 3.5–5.2)
Sodium: 141 mmol/L (ref 134–144)
Total Protein: 7.1 g/dL (ref 6.0–8.5)

## 2018-06-28 LAB — CBC WITH DIFFERENTIAL/PLATELET
BASOS ABS: 0.1 10*3/uL (ref 0.0–0.2)
Basos: 1 %
EOS (ABSOLUTE): 0.3 10*3/uL (ref 0.0–0.4)
Eos: 4 %
HEMOGLOBIN: 14.2 g/dL (ref 11.1–15.9)
Hematocrit: 43.9 % (ref 34.0–46.6)
Immature Grans (Abs): 0 10*3/uL (ref 0.0–0.1)
Immature Granulocytes: 0 %
LYMPHS ABS: 2.7 10*3/uL (ref 0.7–3.1)
LYMPHS: 34 %
MCH: 27.8 pg (ref 26.6–33.0)
MCHC: 32.3 g/dL (ref 31.5–35.7)
MCV: 86 fL (ref 79–97)
MONOCYTES: 8 %
Monocytes Absolute: 0.6 10*3/uL (ref 0.1–0.9)
Neutrophils Absolute: 4.2 10*3/uL (ref 1.4–7.0)
Neutrophils: 53 %
PLATELETS: 354 10*3/uL (ref 150–450)
RBC: 5.11 x10E6/uL (ref 3.77–5.28)
RDW: 12.9 % (ref 12.3–15.4)
WBC: 7.8 10*3/uL (ref 3.4–10.8)

## 2018-06-28 LAB — TSH: TSH: 1.6 u[IU]/mL (ref 0.450–4.500)

## 2018-06-28 LAB — MAGNESIUM: MAGNESIUM: 1.6 mg/dL (ref 1.6–2.3)

## 2018-06-28 MED ORDER — MAGNESIUM OXIDE 400 MG PO TABS: 400.0000 mg | ORAL_TABLET | Freq: Every day | ORAL | 3 refills | Status: DC

## 2018-06-28 NOTE — Telephone Encounter (Signed)
Patient returning call for results 

## 2018-06-28 NOTE — Telephone Encounter (Signed)
Patient verbalized understanding of results and plan of care from 06/27/18.

## 2018-06-30 ENCOUNTER — Telehealth: Payer: Self-pay | Admitting: Family Medicine

## 2018-06-30 MED ORDER — ALBUTEROL SULFATE HFA 108 (90 BASE) MCG/ACT IN AERS: 2.0000 | INHALATION_SPRAY | RESPIRATORY_TRACT | 1 refills | Status: DC | PRN

## 2018-06-30 MED ORDER — PANTOPRAZOLE SODIUM 40 MG PO TBEC: 40.0000 mg | DELAYED_RELEASE_TABLET | Freq: Every day | ORAL | 1 refills | Status: DC

## 2018-06-30 MED ORDER — LEVOTHYROXINE SODIUM 125 MCG PO TABS: 125.0000 ug | ORAL_TABLET | Freq: Every day | ORAL | 0 refills | Status: DC

## 2018-06-30 NOTE — Telephone Encounter (Signed)
Copied from [place] [phone]. Topic: Quick Communication - Rx Refill/Question >> Jun 30, 2018 10:21 AM [name] wrote: Medication:levothyroxine (SYNTHROID, LEVOTHROID) 125 MCG tablet [[id number]] 90 day albuterol (PROVENTIL HFA;VENTOLIN HFA) 108 (90 Base) MCG/ACT inhaler [[id number]] pantoprazole (PROTONIX) 40 MG tablet [[id number]] 90 day supply   Has the patient contacted their pharmacy? no Preferred Pharmacy (with phone number or street name):[facility] [place], [place] [phone] (Phone) [phone] (Fax)    Agent: Please be advised that RX refills may take up to 3 business days. We ask that you follow-up with your pharmacy.

## 2018-06-30 NOTE — Telephone Encounter (Signed)
Refilled

## 2018-06-30 NOTE — Telephone Encounter (Signed)
Last Ov: 7/12 next:10/24

## 2018-07-05 ENCOUNTER — Other Ambulatory Visit: Payer: Self-pay

## 2018-07-05 ENCOUNTER — Ambulatory Visit
Admission: RE | Admit: 2018-07-05 | Discharge: 2018-07-05 | Disposition: A | Discharge status: Home / Self Care | Payer: Medicare HMO | Source: Ambulatory Visit | Attending: Physician Assistant | Admitting: Physician Assistant

## 2018-07-05 ENCOUNTER — Ambulatory Visit (INDEPENDENT_AMBULATORY_CARE_PROVIDER_SITE_OTHER): Payer: Medicare HMO

## 2018-07-05 DIAGNOSIS — R0602 Shortness of breath: Secondary | ICD-10-CM

## 2018-07-05 LAB — NM MYOCAR MULTI W/SPECT W/WALL MOTION / EF
CHL CUP NUCLEAR SRS: 2
CSEPHR: 51 %
CSEPPHR: 86 {beats}/min
LV dias vol: 98 mL (ref 46–106)
LVSYSVOL: 30 mL
Rest HR: 45 {beats}/min
SDS: 1
SSS: 0
TID: 1.14

## 2018-07-05 MED ORDER — REGADENOSON 0.4 MG/5ML IV SOLN
0.4000 mg | Freq: Once | INTRAVENOUS | Status: AC
  Administered 2018-07-05: 0.4 mg via INTRAVENOUS

## 2018-07-05 MED ORDER — TECHNETIUM TC 99M TETROFOSMIN IV KIT
32.5120 | PACK | Freq: Once | INTRAVENOUS | Status: AC | PRN
  Administered 2018-07-05: 32.512 via INTRAVENOUS

## 2018-07-05 MED ORDER — TECHNETIUM TC 99M TETROFOSMIN IV KIT
13.3000 | PACK | Freq: Once | INTRAVENOUS | Status: AC | PRN
  Administered 2018-07-05: 13.3 via INTRAVENOUS

## 2018-07-10 ENCOUNTER — Encounter: Payer: Self-pay | Admitting: Family Medicine

## 2018-07-10 ENCOUNTER — Ambulatory Visit (INDEPENDENT_AMBULATORY_CARE_PROVIDER_SITE_OTHER): Payer: Medicare HMO | Admitting: Family Medicine

## 2018-07-10 VITALS — BP 132/74 | HR 85 | Temp 98.4°F | Ht 65.0 in | Wt 182.8 lb

## 2018-07-10 DIAGNOSIS — M25542 Pain in joints of left hand: Secondary | ICD-10-CM | POA: Diagnosis not present

## 2018-07-10 DIAGNOSIS — R002 Palpitations: Secondary | ICD-10-CM

## 2018-07-10 DIAGNOSIS — E538 Deficiency of other specified B group vitamins: Secondary | ICD-10-CM | POA: Diagnosis not present

## 2018-07-10 DIAGNOSIS — M25541 Pain in joints of right hand: Secondary | ICD-10-CM | POA: Diagnosis not present

## 2018-07-10 DIAGNOSIS — Z598 Other problems related to housing and economic circumstances: Secondary | ICD-10-CM | POA: Diagnosis not present

## 2018-07-10 DIAGNOSIS — Z599 Problem related to housing and economic circumstances, unspecified: Secondary | ICD-10-CM

## 2018-07-10 DIAGNOSIS — E559 Vitamin D deficiency, unspecified: Secondary | ICD-10-CM

## 2018-07-10 DIAGNOSIS — R52 Pain, unspecified: Secondary | ICD-10-CM | POA: Diagnosis not present

## 2018-07-10 MED ORDER — MONTELUKAST SODIUM 10 MG PO TABS: 10.0000 mg | ORAL_TABLET | Freq: Every day | ORAL | 3 refills | Status: DC

## 2018-07-10 MED ORDER — LEVOTHYROXINE SODIUM 125 MCG PO TABS: 125.0000 ug | ORAL_TABLET | Freq: Every day | ORAL | 0 refills | Status: DC

## 2018-07-10 MED ORDER — RANITIDINE HCL 300 MG PO TABS: 300.0000 mg | ORAL_TABLET | Freq: Every day | ORAL | 3 refills | Status: DC
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
## 2018-07-10 NOTE — Patient Instructions (Signed)
I'll suggest CoQ 10 for any aches that might be associated with cholesterol medicine if that may be the cause We'll get labs first to see if it's more inflammatory in nature We'll have someone contact you about help with your medicines If you have not heard anything from my staff in a week about any orders/referrals/studies from today, please contact us here to follow-up [phone]

## 2018-07-12 LAB — ANA,IFA RA DIAG PNL W/RFLX TIT/PATN: ANA: NEGATIVE

## 2018-07-12 LAB — COMPLETE METABOLIC PANEL WITH GFR
AG RATIO: 1.5 (calc) (ref 1.0–2.5)
ALKALINE PHOSPHATASE (APISO): 65 U/L (ref 33–130)
ALT: 15 U/L (ref 6–29)
AST: 18 U/L (ref 10–35)
Albumin: 4.4 g/dL (ref 3.6–5.1)
BILIRUBIN TOTAL: 0.3 mg/dL (ref 0.2–1.2)
BUN: 17 mg/dL (ref 7–25)
CHLORIDE: 104 mmol/L (ref 98–110)
CO2: 29 mmol/L (ref 20–32)
Calcium: 10.6 mg/dL — ABNORMAL HIGH (ref 8.6–10.4)
Creat: 0.86 mg/dL (ref 0.50–1.05)
GFR, Est African American: 90 mL/min/{1.73_m2} (ref >=60)
GFR, Est Non African American: 78 mL/min/{1.73_m2} (ref >=60)
Globulin: 2.9 g/dL (calc) (ref 1.9–3.7)
Glucose, Bld: 87 mg/dL (ref 65–139)
POTASSIUM: 4.6 mmol/L (ref 3.5–5.3)
Sodium: 140 mmol/L (ref 135–146)
TOTAL PROTEIN: 7.3 g/dL (ref 6.1–8.1)

## 2018-07-12 LAB — CK: CK TOTAL: 62 U/L (ref 29–143)

## 2018-07-12 LAB — C-REACTIVE PROTEIN: CRP: 3.5 mg/L (ref <8.0)

## 2018-07-12 LAB — VITAMIN B12: Vitamin B-12: 731 pg/mL (ref 200–1100)

## 2018-07-12 LAB — VITAMIN D 25 HYDROXY (VIT D DEFICIENCY, FRACTURES): Vit D, 25-Hydroxy: 37 ng/mL (ref 30–100)

## 2018-07-12 LAB — PHOSPHORUS: Phosphorus: 3.7 mg/dL (ref 2.5–4.5)

## 2018-07-12 LAB — MAGNESIUM: Magnesium: 1.9 mg/dL (ref 1.5–2.5)

## 2018-07-14 ENCOUNTER — Ambulatory Visit (INDEPENDENT_AMBULATORY_CARE_PROVIDER_SITE_OTHER): Payer: Medicare HMO | Admitting: Family Medicine

## 2018-07-14 ENCOUNTER — Encounter: Payer: Self-pay | Admitting: Family Medicine

## 2018-07-14 ENCOUNTER — Other Ambulatory Visit (HOSPITAL_COMMUNITY)
Admission: RE | Admit: 2018-07-14 | Discharge: 2018-07-14 | Disposition: A | Discharge status: Home / Self Care | Payer: Medicare HMO | Source: Ambulatory Visit | Attending: Family Medicine | Admitting: Family Medicine

## 2018-07-14 VITALS — BP 126/70 | HR 89 | Temp 98.4°F | Ht 65.0 in | Wt 182.2 lb

## 2018-07-14 DIAGNOSIS — R300 Dysuria: Secondary | ICD-10-CM | POA: Diagnosis not present

## 2018-07-14 DIAGNOSIS — Z1159 Encounter for screening for other viral diseases: Secondary | ICD-10-CM | POA: Diagnosis not present

## 2018-07-14 DIAGNOSIS — Z113 Encounter for screening for infections with a predominantly sexual mode of transmission: Secondary | ICD-10-CM | POA: Diagnosis present

## 2018-07-14 DIAGNOSIS — R69 Illness, unspecified: Secondary | ICD-10-CM | POA: Diagnosis not present

## 2018-07-14 DIAGNOSIS — N941 Unspecified dyspareunia: Secondary | ICD-10-CM

## 2018-07-14 LAB — POCT URINALYSIS DIPSTICK
BILIRUBIN UA: NEGATIVE
Blood, UA: NEGATIVE
GLUCOSE UA: NEGATIVE
Ketones, UA: NEGATIVE
Nitrite, UA: NEGATIVE
Odor: NORMAL
Protein, UA: NEGATIVE
SPEC GRAV UA: 1.02 (ref 1.010–1.025)
Urobilinogen, UA: 0.2 E.U./dL
pH, UA: 5.5 (ref 5.0–8.0)
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
## 2018-07-14 NOTE — Patient Instructions (Signed)
We'll see what the results of the tests show and then send in medicine and let you know through [place]

## 2018-07-17 ENCOUNTER — Other Ambulatory Visit: Payer: Self-pay | Admitting: Family Medicine

## 2018-07-17 ENCOUNTER — Encounter: Payer: Self-pay | Admitting: Family Medicine

## 2018-07-17 DIAGNOSIS — R002 Palpitations: Secondary | ICD-10-CM | POA: Diagnosis not present

## 2018-07-17 LAB — HEPATITIS PANEL, ACUTE
HEP A IGM: NONREACTIVE
HEP B C IGM: NONREACTIVE
HEP B S AG: NONREACTIVE
Hepatitis C Ab: NONREACTIVE
SIGNAL TO CUT-OFF: 0.01 (ref <1.00)

## 2018-07-17 LAB — URINE CULTURE
MICRO NUMBER:: 91101477
SPECIMEN QUALITY:: ADEQUATE

## 2018-07-17 LAB — RPR: RPR: NONREACTIVE

## 2018-07-17 LAB — HIV ANTIBODY (ROUTINE TESTING W REFLEX): HIV 1&2 Ab, 4th Generation: NONREACTIVE

## 2018-07-17 MED ORDER — FLUCONAZOLE 150 MG PO TABS: 150.0000 mg | ORAL_TABLET | Freq: Once | ORAL | 1 refills | Status: AC

## 2018-07-17 MED ORDER — AMOXICILLIN 875 MG PO TABS: 875.0000 mg | ORAL_TABLET | Freq: Two times a day (BID) | ORAL | 0 refills | Status: DC

## 2018-07-17 NOTE — Addendum Note (Signed)
Addended by: LADA, Satira Anis on: 07/17/2018 05:34 PM   Modules accepted: Orders

## 2018-07-17 NOTE — Progress Notes (Signed)
Start amox for UTI

## 2018-07-18 LAB — URINE CYTOLOGY ANCILLARY ONLY
Chlamydia: NEGATIVE
Neisseria Gonorrhea: NEGATIVE
TRICH (WINDOWPATH): NEGATIVE

## 2018-07-19 ENCOUNTER — Encounter: Payer: Self-pay | Admitting: Family Medicine

## 2018-07-19 ENCOUNTER — Telehealth: Payer: Self-pay | Admitting: Family Medicine

## 2018-07-19 DIAGNOSIS — R5383 Other fatigue: Secondary | ICD-10-CM | POA: Diagnosis not present

## 2018-07-19 DIAGNOSIS — E669 Obesity, unspecified: Secondary | ICD-10-CM | POA: Diagnosis not present

## 2018-07-19 DIAGNOSIS — R5382 Chronic fatigue, unspecified: Secondary | ICD-10-CM | POA: Diagnosis not present

## 2018-07-19 DIAGNOSIS — Z6830 Body mass index (BMI) 30.0-30.9, adult: Secondary | ICD-10-CM | POA: Diagnosis not present

## 2018-07-19 DIAGNOSIS — Z840 Family history of diseases of the skin and subcutaneous tissue: Secondary | ICD-10-CM | POA: Diagnosis not present

## 2018-07-19 DIAGNOSIS — M797 Fibromyalgia: Secondary | ICD-10-CM | POA: Diagnosis not present

## 2018-07-19 DIAGNOSIS — G894 Chronic pain syndrome: Secondary | ICD-10-CM | POA: Diagnosis not present

## 2018-07-19 DIAGNOSIS — M2550 Pain in unspecified joint: Secondary | ICD-10-CM | POA: Diagnosis not present

## 2018-07-19 MED ORDER — OMEPRAZOLE 20 MG PO CPDR: 20.0000 mg | DELAYED_RELEASE_CAPSULE | Freq: Every day | ORAL | 0 refills | Status: DC

## 2018-07-19 MED ORDER — DEXLANSOPRAZOLE 30 MG PO CPDR: 30.0000 mg | DELAYED_RELEASE_CAPSULE | Freq: Every day | ORAL | 0 refills | Status: DC

## 2018-07-19 NOTE — Telephone Encounter (Signed)
Copied from [place] [phone]. Topic: Quick Communication - See Telephone Encounter >> Jul 19, 2018 10:41 AM Ahmed Prima L wrote: CRM for notification. See Telephone encounter for: 07/19/18.  Patient states that ranitidine (ZANTAC) 300 MG tablet is not working. Her stomach is still hurting with her taking it and it is setting off her asma. She is still currently taking it. She would like the nurse to call her back.

## 2018-07-19 NOTE — Telephone Encounter (Signed)
Will use PPI for just one month while she really avoids triggers PPIs have risks if used long-term so we will want her to use this for the shortest time possible Thank you

## 2018-07-19 NOTE — Telephone Encounter (Signed)
Left detailed voicemail

## 2018-07-19 NOTE — Telephone Encounter (Signed)
Pt states she cannot take the omeprazole because she states it gives her abdominal pain/ hunger pain that will not stop.  She states can't take omeprazole, prontix nor zantac?  Has an appt with GI on the 2nd but states needs something until then?

## 2018-07-19 NOTE — Addendum Note (Signed)
Addended by: LADA, Satira Anis on: 07/19/2018 04:46 PM   Modules accepted: Orders

## 2018-07-19 NOTE — Telephone Encounter (Signed)
Talked to patient. She stated that she is unable to take omemprazole and she stated that you were aware of the reasons. Please advise?

## 2018-07-19 NOTE — Telephone Encounter (Signed)
Patient returning call to [place]. Please advise.

## 2018-07-19 NOTE — Telephone Encounter (Signed)
Rx for Dexilant sent in If we get a prior auth form, please complete using the info she's given today

## 2018-07-19 NOTE — Addendum Note (Signed)
Addended by: Jermar Colter, Satira Anis on: 07/19/2018 03:31 PM   Modules accepted: Orders

## 2018-07-19 NOTE — Telephone Encounter (Signed)
I'm sorry, but I do not see omeprazole listed as an allergy and I do not know the reason she can't take it Please just document, add as an allergy or adverse reaction if necessary and ask what she can take and I'll be glad to prescribe

## 2018-07-20 LAB — URINE CYTOLOGY ANCILLARY ONLY
Bacterial vaginitis: NEGATIVE
Candida vaginitis: NEGATIVE

## 2018-07-20 NOTE — Telephone Encounter (Signed)
Laura [name], please document her symptoms in adverse reactions as she mentioned in this note and on the phone

## 2018-07-25 ENCOUNTER — Telehealth: Payer: Self-pay | Admitting: *Deleted

## 2018-07-25 MED ORDER — DILTIAZEM HCL 30 MG PO TABS: 30.0000 mg | ORAL_TABLET | Freq: Three times a day (TID) | ORAL | 3 refills | Status: DC | PRN

## 2018-07-25 NOTE — Telephone Encounter (Signed)
No answer. Left message to call back.   

## 2018-07-25 NOTE — Telephone Encounter (Signed)
Rx sent to pharmacy   

## 2018-07-25 NOTE — Telephone Encounter (Signed)
Results called to pt. Pt verbalized understanding. Patient would like to have some medication to help her with the heart rate. She states sometimes the feeling is so strong she feels like she may vomit. Advised patient I will route to [facility] and call her back with the recommendations.

## 2018-07-25 NOTE — Telephone Encounter (Signed)
Diltiazem 30 mg tid prn palpitations or heart rate > 100 bpm.

## 2018-07-25 NOTE — Telephone Encounter (Signed)
Patient returning call for HM results.  See below

## 2018-07-25 NOTE — Telephone Encounter (Signed)
-----   Message from Rise Mu, PA-C sent at 07/25/2018  7:07 AM EDT ----- Hear monitor showed NSR with an average heart rate of 75 bpm. There were 5 runs of SVT with the longest being 6 bets with an average heart rate of 100 bpm. 2nd degree AV block, type I was noted. Occasional PVCs. Recent normal Lexiscan Myoview and echo with normal EF. She has had intermittent bradycardia which has lead to the inability to use standing beta blockers. If she would like, we could consider prn short-acting propranolol to be used for tachy-palpitations. Just let us know. Recent potassium, magnesium, and thyroid function ok.

## 2018-07-25 NOTE — Telephone Encounter (Signed)
Patient has documented allergies to both beta blocker and calcium channel blockers (swelling to both). Please get details of this. What was going on with the swelling with beta blockers? Was the swelling with amlodipine just lower extremity swelling?

## 2018-07-25 NOTE — Telephone Encounter (Signed)
Patient agreeable. She asked me to send to her mail order pharmacy. Routing back to [place] for clarification of frequency.

## 2018-07-25 NOTE — Telephone Encounter (Signed)
Called patient to clarify allergies. Losartan- swelling of face and hands Amlodipine - Swelling of lower extremities Metoprolol - swelling of lower extremities Bisoprolol - swelling of kidneys, almost went on dialysis until doctor stopped the med- almost 30 years ago.  She is willing to try whatever medication is recommended.

## 2018-07-25 NOTE — Telephone Encounter (Signed)
Trial of diltiazem 30 mg prn tachy-palpitations.

## 2018-08-02 ENCOUNTER — Encounter

## 2018-08-02 ENCOUNTER — Ambulatory Visit: Payer: Medicare HMO | Admitting: Internal Medicine

## 2018-08-02 ENCOUNTER — Other Ambulatory Visit (INDEPENDENT_AMBULATORY_CARE_PROVIDER_SITE_OTHER): Payer: Medicare HMO

## 2018-08-02 ENCOUNTER — Other Ambulatory Visit: Payer: Self-pay | Admitting: Family Medicine

## 2018-08-02 ENCOUNTER — Encounter: Payer: Self-pay | Admitting: Internal Medicine

## 2018-08-02 VITALS — BP 122/80 | HR 71 | Ht 65.0 in | Wt 183.0 lb

## 2018-08-02 DIAGNOSIS — R1011 Right upper quadrant pain: Secondary | ICD-10-CM

## 2018-08-02 DIAGNOSIS — R110 Nausea: Secondary | ICD-10-CM | POA: Diagnosis not present

## 2018-08-02 LAB — LIPASE: Lipase: 60 U/L — ABNORMAL HIGH (ref 11.0–59.0)

## 2018-08-02 LAB — HEPATIC FUNCTION PANEL
ALT: 14 U/L (ref 0–35)
AST: 14 U/L (ref 0–37)
Albumin: 4.5 g/dL (ref 3.5–5.2)
Alkaline Phosphatase: 51 U/L (ref 39–117)
BILIRUBIN DIRECT: 0.1 mg/dL (ref 0.0–0.3)
Total Bilirubin: 0.4 mg/dL (ref 0.2–1.2)
Total Protein: 7.8 g/dL (ref 6.0–8.3)

## 2018-08-02 LAB — AMYLASE: AMYLASE: 65 U/L (ref 27–131)

## 2018-08-02 MED ORDER — MONTELUKAST SODIUM 10 MG PO TABS: 10.0000 mg | ORAL_TABLET | Freq: Every day | ORAL | 3 refills | Status: DC

## 2018-08-02 MED ORDER — ONDANSETRON 4 MG PO TBDP: 4.0000 mg | ORAL_TABLET | Freq: Three times a day (TID) | ORAL | 0 refills | Status: DC | PRN
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
## 2018-08-02 NOTE — Patient Instructions (Addendum)
Your provider has requested that you go to the basement level for lab work before leaving today. Press "B" on the elevator. The lab is located at the first door on the left as you exit the elevator.   We have sent the following medications to your pharmacy for you to pick up at your convenience: Generic zofran  We are giving you samples of IBgard to try.   You have been scheduled for a HIDA scan at [facility] (1st floor) on 08/08/18. Please arrive 30 minutes prior to your scheduled appointment at  4:40HK. Make certain not to have anything to eat or drink at least 6 hours prior to your test. Should this appointment date or time not work well for you, please call radiology scheduling at [phone].  _____________________________________________________________________ hepatobiliary (HIDA) scan is an imaging procedure used to diagnose problems in the liver, gallbladder and bile ducts. In the HIDA scan, a radioactive chemical or tracer is injected into a vein in your arm. The tracer is handled by the liver like bile. Bile is a fluid produced and excreted by your liver that helps your digestive system break down fats in the foods you eat. Bile is stored in your gallbladder and the gallbladder releases the bile when you eat a meal. A special nuclear medicine scanner (gamma camera) tracks the flow of the tracer from your liver into your gallbladder and small intestine.  During your HIDA scan  You'll be asked to change into a hospital gown before your HIDA scan begins. Your health care team will position you on a table, usually on your back. The radioactive tracer is then injected into a vein in your arm.The tracer travels through your bloodstream to your liver, where it's taken up by the bile-producing cells. The radioactive tracer travels with the bile from your liver into your gallbladder and through your bile ducts to your small intestine.You may feel some pressure while the radioactive tracer is  injected into your vein. As you lie on the table, a special gamma camera is positioned over your abdomen taking pictures of the tracer as it moves through your body. The gamma camera takes pictures continually for about an hour. You'll need to keep still during the HIDA scan. This can become uncomfortable, but you may find that you can lessen the discomfort by taking deep breaths and thinking about other things. Tell your health care team if you're uncomfortable. The radiologist will watch on a computer the progress of the radioactive tracer through your body. The HIDA scan may be stopped when the radioactive tracer is seen in the gallbladder and enters your small intestine. This typically takes about an hour. In some cases extra imaging will be performed if original images aren't satisfactory, if morphine is given to help visualize the gallbladder or if the medication CCK is given to look at the contraction of the gallbladder. This test typically takes 2 hours to complete. ________________________________________________________________________   I appreciate the opportunity to care for you. Silvano Rusk, MD, [facility]

## 2018-08-02 NOTE — Telephone Encounter (Signed)
Copied from [place] [id number]. Topic: Quick Communication - See Telephone Encounter >> Aug 02, 2018  8:10 AM Conception Chancy, NT wrote: CRM for notification. See Telephone encounter for: 08/02/18.  Patient is requesting a refill of montelukast (SINGULAIR) 10 MG tablet. She states she is needing this prescription with refills sent to [facility] instead of [facility]. She would like a 90 day supply.  [place] [place] [address] Phone: [phone] Fax: [phone]

## 2018-08-03 LAB — PTH, INTACT AND CALCIUM
CALCIUM: 9.9 mg/dL (ref 8.6–10.4)
PTH: 45 pg/mL (ref 14–64)

## 2018-08-03 LAB — CALCIUM, IONIZED: Calcium, Ion: 5.26 mg/dL (ref 4.8–5.6)

## 2018-08-08 ENCOUNTER — Emergency Department (HOSPITAL_COMMUNITY)
Admission: EM | Admit: 2018-08-08 | Discharge: 2018-08-08 | Disposition: A | Discharge status: Home / Self Care | Payer: Medicare HMO | Attending: Emergency Medicine | Admitting: Emergency Medicine

## 2018-08-08 ENCOUNTER — Other Ambulatory Visit: Payer: Self-pay

## 2018-08-08 ENCOUNTER — Emergency Department (HOSPITAL_COMMUNITY): Payer: Medicare HMO

## 2018-08-08 ENCOUNTER — Ambulatory Visit (HOSPITAL_COMMUNITY)
Admission: RE | Admit: 2018-08-08 | Discharge: 2018-08-08 | Disposition: A | Discharge status: Home / Self Care | Payer: Medicare HMO | Source: Ambulatory Visit | Attending: Internal Medicine | Admitting: Internal Medicine

## 2018-08-08 ENCOUNTER — Encounter (HOSPITAL_COMMUNITY): Payer: Self-pay

## 2018-08-08 DIAGNOSIS — I129 Hypertensive chronic kidney disease with stage 1 through stage 4 chronic kidney disease, or unspecified chronic kidney disease: Secondary | ICD-10-CM | POA: Insufficient documentation

## 2018-08-08 DIAGNOSIS — R1010 Upper abdominal pain, unspecified: Secondary | ICD-10-CM | POA: Diagnosis not present

## 2018-08-08 DIAGNOSIS — J4520 Mild intermittent asthma, uncomplicated: Secondary | ICD-10-CM | POA: Diagnosis not present

## 2018-08-08 DIAGNOSIS — Z79899 Other long term (current) drug therapy: Secondary | ICD-10-CM | POA: Insufficient documentation

## 2018-08-08 DIAGNOSIS — R109 Unspecified abdominal pain: Secondary | ICD-10-CM

## 2018-08-08 DIAGNOSIS — Z87891 Personal history of nicotine dependence: Secondary | ICD-10-CM | POA: Insufficient documentation

## 2018-08-08 DIAGNOSIS — J449 Chronic obstructive pulmonary disease, unspecified: Secondary | ICD-10-CM | POA: Diagnosis not present

## 2018-08-08 DIAGNOSIS — R7303 Prediabetes: Secondary | ICD-10-CM | POA: Insufficient documentation

## 2018-08-08 DIAGNOSIS — R1011 Right upper quadrant pain: Secondary | ICD-10-CM

## 2018-08-08 DIAGNOSIS — N189 Chronic kidney disease, unspecified: Secondary | ICD-10-CM | POA: Diagnosis not present

## 2018-08-08 DIAGNOSIS — R112 Nausea with vomiting, unspecified: Secondary | ICD-10-CM | POA: Diagnosis not present

## 2018-08-08 DIAGNOSIS — R1110 Vomiting, unspecified: Secondary | ICD-10-CM | POA: Diagnosis not present

## 2018-08-08 LAB — URINALYSIS, ROUTINE W REFLEX MICROSCOPIC
Bilirubin Urine: NEGATIVE
Glucose, UA: NEGATIVE mg/dL
Hgb urine dipstick: NEGATIVE
Ketones, ur: NEGATIVE mg/dL
Leukocytes, UA: NEGATIVE
Nitrite: NEGATIVE
Protein, ur: NEGATIVE mg/dL
Specific Gravity, Urine: 1.005 (ref 1.005–1.030)
pH: 5 (ref 5.0–8.0)

## 2018-08-08 LAB — COMPREHENSIVE METABOLIC PANEL
ALT: 16 U/L (ref 0–44)
AST: 16 U/L (ref 15–41)
Albumin: 4 g/dL (ref 3.5–5.0)
Alkaline Phosphatase: 49 U/L (ref 38–126)
Anion gap: 10 (ref 5–15)
BUN: 20 mg/dL (ref 6–20)
CO2: 27 mmol/L (ref 22–32)
Calcium: 10.1 mg/dL (ref 8.9–10.3)
Chloride: 105 mmol/L (ref 98–111)
Creatinine, Ser: 0.72 mg/dL (ref 0.44–1.00)
GFR calc Af Amer: >60 mL/min (ref >60)
GFR calc non Af Amer: >60 mL/min (ref >60)
Glucose, Bld: 112 mg/dL — ABNORMAL HIGH (ref 70–99)
Potassium: 3.8 mmol/L (ref 3.5–5.1)
Sodium: 142 mmol/L (ref 135–145)
Total Bilirubin: 0.5 mg/dL (ref 0.3–1.2)
Total Protein: 7.1 g/dL (ref 6.5–8.1)

## 2018-08-08 LAB — CBC WITH DIFFERENTIAL/PLATELET
Abs Immature Granulocytes: 0.03 10*3/uL (ref 0.00–0.07)
Basophils Absolute: 0 10*3/uL (ref 0.0–0.1)
Basophils Relative: 1 %
Eosinophils Absolute: 0.1 10*3/uL (ref 0.0–0.5)
Eosinophils Relative: 2 %
HCT: 42.2 % (ref 36.0–46.0)
Hemoglobin: 13.6 g/dL (ref 12.0–15.0)
Immature Granulocytes: 0 %
Lymphocytes Relative: 34 %
Lymphs Abs: 2.5 10*3/uL (ref 0.7–4.0)
MCH: 27.8 pg (ref 26.0–34.0)
MCHC: 32.2 g/dL (ref 30.0–36.0)
MCV: 86.3 fL (ref 80.0–100.0)
Monocytes Absolute: 0.6 10*3/uL (ref 0.1–1.0)
Monocytes Relative: 8 %
Neutro Abs: 4 10*3/uL (ref 1.7–7.7)
Neutrophils Relative %: 56 %
Platelets: 297 10*3/uL (ref 150–400)
RBC: 4.89 MIL/uL (ref 3.87–5.11)
RDW: 14.1 % (ref 11.5–15.5)
WBC: 7.3 10*3/uL (ref 4.0–10.5)
nRBC: 0 % (ref 0.0–0.2)

## 2018-08-08 LAB — PREGNANCY, URINE: Preg Test, Ur: NEGATIVE

## 2018-08-08 MED ORDER — TECHNETIUM TC 99M MEBROFENIN IV KIT
5.2000 | PACK | Freq: Once | INTRAVENOUS | Status: AC | PRN
  Administered 2018-08-08: 5.2 via INTRAVENOUS

## 2018-08-08 MED ORDER — OXYCODONE-ACETAMINOPHEN 5-325 MG PO TABS
1.0000 | ORAL_TABLET | Freq: Once | ORAL | Status: DC
  Filled 2018-08-08: qty 1

## 2018-08-08 MED ORDER — IOPAMIDOL (ISOVUE-300) INJECTION 61%
100.0000 mL | Freq: Once | INTRAVENOUS | Status: AC | PRN
  Administered 2018-08-08: 100 mL via INTRAVENOUS

## 2018-08-08 MED ORDER — IOPAMIDOL (ISOVUE-300) INJECTION 61%
INTRAVENOUS | Status: AC
  Filled 2018-08-08: qty 100

## 2018-08-08 MED ORDER — SODIUM CHLORIDE 0.9 % IJ SOLN
INTRAMUSCULAR | Status: AC
  Filled 2018-08-08: qty 50

## 2018-08-08 MED ORDER — IBUPROFEN 200 MG PO TABS
600.0000 mg | ORAL_TABLET | Freq: Once | ORAL | Status: AC
  Administered 2018-08-08: 600 mg via ORAL
  Filled 2018-08-08: qty 3

## 2018-08-08 MED ORDER — SODIUM CHLORIDE 0.9 % IV BOLUS
1000.0000 mL | Freq: Once | INTRAVENOUS | Status: AC
  Administered 2018-08-08: 1000 mL via INTRAVENOUS

## 2018-08-08 NOTE — Progress Notes (Signed)
Let her know:  1) Labs - LFT's calcium tests are ok 2) Gallbladder works ok 3) Hard to make a good argument that the gallbladder needs to come out though sometimes as a last resort it can be removed if a surgeon thinks reasonable 4) sometimes stress aggravates and causes physical sxs - does she think that could be possible? 5) symptom update also please

## 2018-08-08 NOTE — Progress Notes (Signed)
Labs ok See HIDA scan also
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
## 2018-08-08 NOTE — ED Triage Notes (Signed)
Patient c/o left flank pain x 3-4 days and bladder pain x 2 hours. Patient reports a long history of kidney stones and UTI's.

## 2018-08-09 ENCOUNTER — Encounter: Payer: Self-pay | Admitting: Family Medicine

## 2018-08-09 ENCOUNTER — Other Ambulatory Visit: Payer: Self-pay | Admitting: Nurse Practitioner

## 2018-08-09 LAB — URINE CULTURE: Culture: 60000 — AB

## 2018-08-09 MED ORDER — ROSUVASTATIN CALCIUM 40 MG PO TABS: 40.0000 mg | ORAL_TABLET | Freq: Every day | ORAL | 1 refills | Status: DC

## 2018-08-10 ENCOUNTER — Telehealth: Payer: Self-pay | Admitting: *Deleted

## 2018-08-10 NOTE — Telephone Encounter (Signed)
Post ED Visit - Positive Culture Follow-up  Culture report reviewed by antimicrobial stewardship pharmacist:  []  Elenor Quinones, Pharm.D. []  Heide Guile, Pharm.D., BCPS AQ-ID []  Parks Neptune, Pharm.D., BCPS []  Alycia Rossetti, Pharm.D., BCPS []  [place], Pharm.D., BCPS, AAHIVP []  Legrand Como, Pharm.D., BCPS, AAHIVP []  Salome Arnt, PharmD, BCPS []  Johnnette Gourd, PharmD, BCPS []  Hughes Better, PharmD, BCPS []  Leeroy Cha, PharmD  Positive urine culture,  Reviewed by Kennith Maes, PA-C No urinary symptoms documented and no further patient follow-up is required at this time.  Harlon Flor Talley 08/10/2018, 11:08 AM

## 2018-08-10 NOTE — Progress Notes (Signed)
ED Antimicrobial Stewardship Positive Culture Follow Up   Laura [name] is an 52 y.o. female who presented to [facility] on 08/08/2018 with a chief complaint of  Chief Complaint  Patient presents with  . Flank Pain    Recent Results (from the past 720 hour(s))  Urine Culture     Status: Abnormal   Collection Time: 07/14/18  4:36 PM  Result Value Ref Range Status   MICRO NUMBER: [id number]  Final   SPECIMEN QUALITY: ADEQUATE  Final   Sample Source URINE  Final   STATUS: FINAL  Final   Result:   Final    Additional organism(s) less than 10,000 CFU/mL isolated. These organisms, commonly found on external and internal genitalia, are considered colonizers. No further testing performed.   ISOLATE 1: Streptococcus agalactiae (A)  Final    Comment: 10,000-50,000 CFU/mL of Group B Streptococcus isolated Beta-hemolytic Streptococci are predictably susceptible to penicillin and other beta-lactams. Susceptibility testing not routinely performed. Erythromycin and clindamycin are not recommended for  treatment of urinary tract infections, but clindamycin may be useful for treatment of rectovaginal colonization or infection.   Urine culture     Status: Abnormal   Collection Time: 08/08/18 11:19 AM  Result Value Ref Range Status   Specimen Description   Final    URINE, CLEAN CATCH Performed at [facility], [place] [address] [place], [place] [address]    Special Requests   Final    NONE Performed at [facility], [place] [address] [place], [place] [address]    Culture (A)  Final    60,000 COLONIES/mL GROUP B STREP(S.AGALACTIAE)ISOLATED TESTING AGAINST S. AGALACTIAE NOT ROUTINELY PERFORMED DUE TO PREDICTABILITY OF AMP/PEN/VAN SUSCEPTIBILITY. Performed at [facility], [place] [address] [place], [place] [address]    Report Status 08/09/2018 FINAL  Final   No urinary symptoms documented. WBC wnl and patient afebrile. Group B strep likely normal flora. Continue  without treatment.  ED Provider: Lyman Bishop, PA-C  Ronna Polio 08/10/2018, 9:32 AM Clinical Pharmacist Monday - Friday phone -  [phone] Saturday - Sunday phone - [phone]

## 2018-08-13 DIAGNOSIS — N201 Calculus of ureter: Secondary | ICD-10-CM | POA: Diagnosis not present

## 2018-08-13 DIAGNOSIS — I10 Essential (primary) hypertension: Secondary | ICD-10-CM | POA: Diagnosis not present

## 2018-08-13 DIAGNOSIS — Z87442 Personal history of urinary calculi: Secondary | ICD-10-CM | POA: Diagnosis not present

## 2018-08-14 NOTE — Progress Notes (Signed)
Ulcer unlikely while on PPI but if her sxs are persisting (RUQ pain, nausea and vomiting) then EGD is next step  Can set up for [place] there is a 2PM open 10/17, could do 1 PM 10/23

## 2018-08-15 ENCOUNTER — Ambulatory Visit (AMBULATORY_SURGERY_CENTER): Payer: Self-pay | Admitting: *Deleted

## 2018-08-15 VITALS — Ht 65.0 in | Wt 194.0 lb

## 2018-08-15 DIAGNOSIS — R1011 Right upper quadrant pain: Secondary | ICD-10-CM

## 2018-08-15 DIAGNOSIS — R110 Nausea: Secondary | ICD-10-CM

## 2018-08-15 NOTE — Progress Notes (Signed)
Patient denies any allergies to eggs or soy. Patient denies any problems with anesthesia/sedation. Patient denies any oxygen use at home. Patient denies taking any diet/weight loss medications or blood thinners. Pt has Kidney stone and is aware to call us if she is unable to make the EGD appointment.

## 2018-08-16 DIAGNOSIS — Z840 Family history of diseases of the skin and subcutaneous tissue: Secondary | ICD-10-CM | POA: Diagnosis not present

## 2018-08-16 DIAGNOSIS — E669 Obesity, unspecified: Secondary | ICD-10-CM | POA: Diagnosis not present

## 2018-08-16 DIAGNOSIS — M2550 Pain in unspecified joint: Secondary | ICD-10-CM | POA: Diagnosis not present

## 2018-08-16 DIAGNOSIS — R5382 Chronic fatigue, unspecified: Secondary | ICD-10-CM | POA: Diagnosis not present

## 2018-08-16 DIAGNOSIS — Z6832 Body mass index (BMI) 32.0-32.9, adult: Secondary | ICD-10-CM | POA: Diagnosis not present

## 2018-08-16 DIAGNOSIS — G894 Chronic pain syndrome: Secondary | ICD-10-CM | POA: Diagnosis not present

## 2018-08-16 DIAGNOSIS — M797 Fibromyalgia: Secondary | ICD-10-CM | POA: Diagnosis not present

## 2018-08-17 ENCOUNTER — Encounter: Payer: Self-pay | Admitting: Internal Medicine

## 2018-08-17 ENCOUNTER — Ambulatory Visit (AMBULATORY_SURGERY_CENTER): Payer: Medicare HMO | Admitting: Internal Medicine

## 2018-08-17 VITALS — BP 117/61 | HR 65 | Temp 98.7°F | Resp 22

## 2018-08-17 DIAGNOSIS — K2980 Duodenitis without bleeding: Secondary | ICD-10-CM

## 2018-08-17 DIAGNOSIS — R1011 Right upper quadrant pain: Secondary | ICD-10-CM

## 2018-08-17 MED ORDER — SODIUM CHLORIDE 0.9 % IV SOLN: 500.0000 mL | Freq: Once | INTRAVENOUS | Status: DC

## 2018-08-17 MED ORDER — PANTOPRAZOLE SODIUM 40 MG PO TBEC: 40.0000 mg | DELAYED_RELEASE_TABLET | Freq: Two times a day (BID) | ORAL | 1 refills | Status: DC

## 2018-08-17 NOTE — Patient Instructions (Addendum)
There was slight inflammation in the duodenum but no ulcer.  I think we should change the acid blocker, perhaps (pantoprazole) - 2x/day vs other.  I appreciate the opportunity to care for you. Gatha Mayer, MD, [facility]  Please read handout on Hiatal Hernia.    YOU HAD AN ENDOSCOPIC PROCEDURE TODAY AT [facility]:   Refer to the procedure report that was given to you for any specific questions about what was found during the examination.  If the procedure report does not answer your questions, please call your gastroenterologist to clarify.  If you requested that your care partner not be given the details of your procedure findings, then the procedure report has been included in a sealed envelope for you to review at your convenience later.  YOU SHOULD EXPECT: Some feelings of bloating in the abdomen. Passage of more gas than usual.  Walking can help get rid of the air that was put into your GI tract during the procedure and reduce the bloating. If you had a lower endoscopy (such as a colonoscopy or flexible sigmoidoscopy) you may notice spotting of blood in your stool or on the toilet paper. If you underwent a bowel prep for your procedure, you may not have a normal bowel movement for a few days.  Please Note:  You might notice some irritation and congestion in your nose or some drainage.  This is from the oxygen used during your procedure.  There is no need for concern and it should clear up in a day or so.  SYMPTOMS TO REPORT IMMEDIATELY:     Following upper endoscopy (EGD)  Vomiting of blood or coffee ground material  New chest pain or pain under the shoulder blades  Painful or persistently difficult swallowing  New shortness of breath  Fever of 100F or higher  Black, tarry-looking stools  For urgent or emergent issues, a gastroenterologist can be reached at any hour by calling [phone].   DIET:  We do recommend a small meal at first, but then  you may proceed to your regular diet.  Drink plenty of fluids but you should avoid alcoholic beverages for 24 hours.  ACTIVITY:  You should plan to take it easy for the rest of today and you should NOT DRIVE or use heavy machinery until tomorrow (because of the sedation medicines used during the test).    FOLLOW UP: Our staff will call the number listed on your records the next business day following your procedure to check on you and address any questions or concerns that you may have regarding the information given to you following your procedure. If we do not reach you, we will leave a message.  However, if you are feeling well and you are not experiencing any problems, there is no need to return our call.  We will assume that you have returned to your regular daily activities without incident.  If any biopsies were taken you will be contacted by phone or by letter within the next 1-3 weeks.  Please call us at [phone] if you have not heard about the biopsies in 3 weeks.    SIGNATURES/CONFIDENTIALITY: You and/or your care partner have signed paperwork which will be entered into your electronic medical record.  These signatures attest to the fact that that the information above on your After Visit Summary has been reviewed and is understood.  Full responsibility of the confidentiality of this discharge information lies with you and/or your  care-partner.

## 2018-08-17 NOTE — Op Note (Addendum)
[facility] Patient Name: Laura [name] Procedure Date: 08/17/2018 2:00 PM MRN: [id number] Endoscopist: Gatha Mayer , MD Age: 52 Referring MD:  Date of Birth: 08-28-1966 Gender: Female Account #: [id number] Procedure:                Upper GI endoscopy Indications:              Abdominal pain in the right upper quadrant - no                            gallstones - HIDA ok Medicines:                Propofol per Anesthesia, Monitored Anesthesia Care Procedure:                Pre-Anesthesia Assessment:                           - Prior to the procedure, a History and Physical                            was performed, and patient medications and                            allergies were reviewed. The patient's tolerance of                            previous anesthesia was also reviewed. The risks                            and benefits of the procedure and the sedation                            options and risks were discussed with the patient.                            All questions were answered, and informed consent                            was obtained. Prior Anticoagulants: The patient has                            taken no previous anticoagulant or antiplatelet                            agents. ASA Grade Assessment: II - A patient with                            mild systemic disease. After reviewing the risks                            and benefits, the patient was deemed in                            satisfactory condition to undergo the procedure.  After obtaining informed consent, the endoscope was                            passed under direct vision. Throughout the                            procedure, the patient's blood pressure, pulse, and                            oxygen saturations were monitored continuously. The                            Endoscope was introduced through the mouth, and                            advanced to  the second part of duodenum. The upper                            GI endoscopy was accomplished without difficulty.                            The patient tolerated the procedure well. Scope In: Scope Out: Findings:                 A few localized erosions with stigmata of recent                            bleeding were found in the duodenal bulb.                           A 2 cm hiatal hernia was present.                           The exam was otherwise without abnormality.                           The cardia and gastric fundus were normal on                            retroflexion. Complications:            No immediate complications. Estimated Blood Loss:     Estimated blood loss: none. Impression:               - Duodenal erosions with stigmata of recent                            bleeding.                           - 2 cm hiatal hernia.                           - The examination was otherwise normal.                           -  No specimens collected. Recommendation:           - Patient has a contact number available for                            emergencies. The signs and symptoms of potential                            delayed complications were discussed with the                            patient. Return to normal activities tomorrow.                            Written discharge instructions were provided to the                            patient.                           - Resume previous diet.                           - Continue present medications.                           - Will either change PPI or go to bid - she is                            intolerant of other PPI's (abdominal pain - so try                            bid pantoprazole 40 mg and sghe can call.message me                            w/ update in 1 month)                           Will discuss Gatha Mayer, MD 08/17/2018 2:33:43 PM This report has been signed electronically.

## 2018-08-17 NOTE — Progress Notes (Signed)
To PACU, VSS. Report to [email or web address] 

## 2018-08-17 NOTE — Progress Notes (Signed)
Pt's states no medical or surgical changes since previsit or office visit. 

## 2018-08-18 ENCOUNTER — Telehealth: Payer: Self-pay

## 2018-08-18 DIAGNOSIS — N3000 Acute cystitis without hematuria: Secondary | ICD-10-CM | POA: Diagnosis not present

## 2018-08-18 DIAGNOSIS — N200 Calculus of kidney: Secondary | ICD-10-CM | POA: Diagnosis not present

## 2018-08-18 NOTE — Telephone Encounter (Signed)
  Follow up Call-  Call back number 08/17/2018 07/13/2016  Post procedure Call Back phone  # [phone]  Permission to leave phone message Yes Yes  Some recent data might be hidden     Patient questions:  Do you have a fever, pain , or abdominal swelling? No. Pain Score  0 *  Have you tolerated food without any problems? Yes.    Have you been able to return to your normal activities? Yes.    Do you have any questions about your discharge instructions: Diet   No. Medications  No. Follow up visit  No.  Do you have questions or concerns about your Care? No.  Actions: * If pain score is 4 or above: No action needed, pain <4.

## 2018-08-24 ENCOUNTER — Ambulatory Visit: Payer: 59 | Admitting: Family Medicine

## 2018-08-28 ENCOUNTER — Other Ambulatory Visit: Payer: Self-pay | Admitting: Physician Assistant

## 2018-08-28 DIAGNOSIS — M25511 Pain in right shoulder: Secondary | ICD-10-CM

## 2018-09-05 ENCOUNTER — Ambulatory Visit: Payer: Medicare HMO | Admitting: Physician Assistant

## 2018-09-05 ENCOUNTER — Encounter: Payer: Self-pay | Admitting: Family Medicine

## 2018-09-05 DIAGNOSIS — G8929 Other chronic pain: Secondary | ICD-10-CM | POA: Diagnosis not present

## 2018-09-05 DIAGNOSIS — N189 Chronic kidney disease, unspecified: Secondary | ICD-10-CM | POA: Diagnosis not present

## 2018-09-05 DIAGNOSIS — Z6831 Body mass index (BMI) 31.0-31.9, adult: Secondary | ICD-10-CM | POA: Diagnosis not present

## 2018-09-05 DIAGNOSIS — S4991XA Unspecified injury of right shoulder and upper arm, initial encounter: Secondary | ICD-10-CM | POA: Diagnosis not present

## 2018-09-05 DIAGNOSIS — X501XXA Overexertion from prolonged static or awkward postures, initial encounter: Secondary | ICD-10-CM | POA: Diagnosis not present

## 2018-09-05 DIAGNOSIS — M25511 Pain in right shoulder: Secondary | ICD-10-CM | POA: Diagnosis not present

## 2018-09-05 DIAGNOSIS — Z79899 Other long term (current) drug therapy: Secondary | ICD-10-CM | POA: Diagnosis not present

## 2018-09-05 DIAGNOSIS — I129 Hypertensive chronic kidney disease with stage 1 through stage 4 chronic kidney disease, or unspecified chronic kidney disease: Secondary | ICD-10-CM | POA: Diagnosis not present

## 2018-09-07 ENCOUNTER — Telehealth: Payer: Self-pay | Admitting: *Deleted

## 2018-09-07 ENCOUNTER — Telehealth: Payer: Self-pay

## 2018-09-07 ENCOUNTER — Ambulatory Visit: Payer: Medicare HMO | Admitting: Physician Assistant

## 2018-09-07 DIAGNOSIS — M25511 Pain in right shoulder: Secondary | ICD-10-CM

## 2018-09-07 NOTE — Telephone Encounter (Signed)
blank

## 2018-09-07 NOTE — Telephone Encounter (Signed)
Lm for the patient to call me. Asked on the message if she cou ld come in any earlier this morning.  Her appointment is for 11:15 am.

## 2018-09-07 NOTE — Telephone Encounter (Signed)
Please enter referral - thank you

## 2018-09-12 ENCOUNTER — Ambulatory Visit (INDEPENDENT_AMBULATORY_CARE_PROVIDER_SITE_OTHER): Payer: Medicare HMO | Admitting: Cardiovascular Disease

## 2018-09-12 VITALS — BP 128/68 | HR 75 | Ht 65.0 in | Wt 186.0 lb

## 2018-09-12 DIAGNOSIS — E785 Hyperlipidemia, unspecified: Secondary | ICD-10-CM | POA: Diagnosis not present

## 2018-09-12 DIAGNOSIS — I493 Ventricular premature depolarization: Secondary | ICD-10-CM | POA: Diagnosis not present

## 2018-09-12 DIAGNOSIS — I10 Essential (primary) hypertension: Secondary | ICD-10-CM | POA: Diagnosis not present

## 2018-09-12 MED ORDER — NEBIVOLOL HCL 5 MG PO TABS: 5.0000 mg | ORAL_TABLET | Freq: Every day | ORAL | Status: DC

## 2018-09-12 NOTE — Patient Instructions (Signed)
Medication Instructions:  STOP the Quinapril START Bystolic 5 mg daily  If you need a refill on your cardiac medications before your next appointment, please call your pharmacy.   Lab work: None ordered  Testing/Procedures: None ordered  Follow-Up: At [facility], you and your health needs are our priority.  As part of our continuing mission to provide you with exceptional heart care, we have created designated Provider Care Teams.  These Care Teams include your primary Cardiologist (physician) and Advanced Practice Providers (APPs -  Physician Assistants and Nurse Practitioners) who all work together to provide you with the care you need, when you need it. You will need a follow up appointment in 6 months.  Please call our office 2 months in advance to schedule this appointment.  You may see Dr. Fletcher Anon or one of the following Advanced Practice Providers on your designated Care Team:   Murray Hodgkins, NP Christell Faith, PA-C . Marrianne Mood, PA-C  Any Other Special Instructions Will Be Listed Below (If Applicable). Please call us next week to let us know how you are doing with the Bystolic 5 mg [phone]
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
## 2018-09-15 DIAGNOSIS — M25511 Pain in right shoulder: Secondary | ICD-10-CM | POA: Diagnosis not present

## 2018-09-17 ENCOUNTER — Other Ambulatory Visit: Payer: Medicare HMO

## 2018-09-18 ENCOUNTER — Encounter: Payer: Self-pay | Admitting: Physician Assistant

## 2018-09-18 ENCOUNTER — Ambulatory Visit (INDEPENDENT_AMBULATORY_CARE_PROVIDER_SITE_OTHER): Payer: Medicare HMO | Admitting: Physician Assistant

## 2018-09-18 ENCOUNTER — Other Ambulatory Visit: Payer: Self-pay | Admitting: Physician Assistant

## 2018-09-18 VITALS — BP 118/78 | HR 83 | Ht 65.0 in | Wt 184.2 lb

## 2018-09-18 DIAGNOSIS — R1013 Epigastric pain: Secondary | ICD-10-CM | POA: Diagnosis not present

## 2018-09-18 DIAGNOSIS — K219 Gastro-esophageal reflux disease without esophagitis: Secondary | ICD-10-CM | POA: Diagnosis not present

## 2018-09-18 MED ORDER — SUCRALFATE 1 G PO TABS: 1.0000 g | ORAL_TABLET | Freq: Three times a day (TID) | ORAL | 1 refills | Status: DC

## 2018-09-18 NOTE — Patient Instructions (Addendum)
If you are age 53 or older, your body mass index should be between 23-30. Your Body mass index is 30.65 kg/m. If this is out of the aforementioned range listed, please consider follow up with your Primary Care Provider.  If you are age 47 or younger, your body mass index should be between 19-25. Your Body mass index is 30.65 kg/m. If this is out of the aformentioned range listed, please consider follow up with your Primary Care Provider.   You have been scheduled for an esophageal manometry at [facility] on 10/11/18 at 12:30 pm.. Please arrive 30 minutes prior to your procedure for registration. You will need to go to outpatient registration (1st floor of the hospital) first. Make certain to bring your insurance cards as well as a complete list of medications.  Please remember the following:  1) Do not take any muscle relaxants, xanax (alprazolam) or ativan for 1 day prior to your test as well as the day of the test.  2) Nothing to eat or drink for 4 hours before your test.  3) Hold all diabetic medications/insulin the morning of the test. You may eat and take your medications after the test.  It will take at least 2 weeks to receive the results of this test from your physician. ------------------------------------------ ABOUT ESOPHAGEAL MANOMETRY Esophageal manometry (muh-NOM-uh-tree) is a test that gauges how well your esophagus works. Your esophagus is the long, muscular tube that connects your throat to your stomach. Esophageal manometry measures the rhythmic muscle contractions (peristalsis) that occur in your esophagus when you swallow. Esophageal manometry also measures the coordination and force exerted by the muscles of your esophagus.  During esophageal manometry, a thin, flexible tube (catheter) that contains sensors is passed through your nose, down your esophagus and into your stomach. Esophageal manometry can be helpful in diagnosing some mostly uncommon disorders that  affect your esophagus.  Why it's done Esophageal manometry is used to evaluate the movement (motility) of food through the esophagus and into the stomach. The test measures how well the circular bands of muscle (sphincters) at the top and bottom of your esophagus open and close, as well as the pressure, strength and pattern of the wave of esophageal muscle contractions that moves food along.  What you can expect Esophageal manometry is an outpatient procedure done without sedation. Most people tolerate it well. You may be asked to change into a hospital gown before the test starts.  During esophageal manometry  . While you are sitting up, a member of your health care team sprays your throat with a numbing medication or puts numbing gel in your nose or both.  . A catheter is guided through your nose into your esophagus. The catheter may be sheathed in a water-filled sleeve. It doesn't interfere with your breathing. However, your eyes may water, and you may gag. You may have a slight nosebleed from irritation.  . After the catheter is in place, you may be asked to lie on your back on an exam table, or you may be asked to remain seated.  . You then swallow small sips of water. As you do, a computer connected to the catheter records the pressure, strength and pattern of your esophageal muscle contractions.  . During the test, you'll be asked to breathe slowly and smoothly, remain as still as possible, and swallow only when you're asked to do so.  . A member of your health care team may move the catheter down into your  stomach while the catheter continues its measurements.  . The catheter then is slowly withdrawn. The test usually lasts 20 to 30 minutes.  After esophageal manometry  When your esophageal manometry is complete, you may return to your normal activities  This test typically takes 30-45 minutes to complete. ____________________________________________________________________________  STOP  Pantoprazole.  Follow up with Dr. Carlean Purl on November 03, 2018 at 2:15 pm  Thank you for choosing me and [facility].  Ellouise Newer, PA-C
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
## 2018-09-25 ENCOUNTER — Telehealth: Payer: Self-pay | Admitting: Physician Assistant

## 2018-09-25 ENCOUNTER — Encounter: Payer: Self-pay | Admitting: Cardiovascular Disease

## 2018-09-25 ENCOUNTER — Telehealth: Payer: Self-pay | Admitting: Cardiovascular Disease

## 2018-09-25 DIAGNOSIS — Z0100 Encounter for examination of eyes and vision without abnormal findings: Secondary | ICD-10-CM | POA: Diagnosis not present

## 2018-09-25 DIAGNOSIS — H52222 Regular astigmatism, left eye: Secondary | ICD-10-CM | POA: Diagnosis not present

## 2018-09-25 NOTE — Telephone Encounter (Signed)
Pt states she has talked with her GI doctor and would like a call back to discuss this.

## 2018-09-25 NOTE — Telephone Encounter (Signed)
See PCP soon about the edema and make sure its not something else - seems unusual though not impossible that carafate would do this - need to consdier some sort of allergic reaction to that or something else  Can try famotidine 40 mg bid at this point also

## 2018-09-25 NOTE — Telephone Encounter (Signed)
This encounter was created in error - please disregard.  This encounter was created in error - please disregard.

## 2018-09-25 NOTE — Telephone Encounter (Signed)
We can stop Bystolic and resume Quinapril 20 mg daily.

## 2018-09-25 NOTE — Telephone Encounter (Signed)
Dr. Carlean Purl- what would be your recommendation? Thanks-JLL

## 2018-09-25 NOTE — Telephone Encounter (Signed)
Returned the call to the patient. She stated that she has been having swelling in her hands, feet and face for the past 4 days. She was recently started on 2 new medications: Bystolic and Carafate.  The Bystolic was started on 49/82 and then her GI started her on Carafate to replace her protonix. This was started on 11/21.  She did state that the when she had swelling with the Amlodipine iin the past, that it took a few months to show up. She has also placed a call to her GI provider because she does not feel like the carafate is working for her.   She feels like it is the Carafate that is causing her to swell but would like advice on the next steps. She has been advised to try Benadryl for the swelling for now.

## 2018-09-25 NOTE — Telephone Encounter (Signed)
Patty-please see Dr. Celesta Aver recs. Thanks-JLL

## 2018-09-25 NOTE — Telephone Encounter (Signed)
Pt states that sucralfate is not working for her, she has been experiencing gerd and swelling of her face, hands, and legs.

## 2018-09-25 NOTE — Telephone Encounter (Signed)
° °  Pt c/o medication issue:  1. Name of Medication: bystolic 5 mg po q d vs carafate 1 g po QID   2. How are you currently taking this medication (dosage and times per day)?   3. Are you having a reaction (difficulty breathing--STAT)? Swelling in face legs hands SOB denies swelling in mouth/ tongue   4. What is your medication issue? Swelling started 1 wk ago on carafate for 1 wk on bystolic for 2 wks   Patient needs refills but wants to confirm above is not related to bystolic        *STAT* If patient is at the pharmacy, call can be transferred to refill team.   1. Which medications need to be refilled? (please list name of each medication and dose if known) bystolic 5 m po q d  2. Which pharmacy/location (including street and city if local pharmacy) is medication to be sent to? [facility] mail order   3. Do they need a 30 day or 90 day supply? [place]

## 2018-09-25 NOTE — Telephone Encounter (Signed)
The patient called back stating that the GI provider does not feel like it is the Carafate.   The patient stated that the swelling is from fluid retention. She has gained 8 pounds in the past 4 days which she stated is a  typical reaction for her when her body rejects a medication.  She took a beta blocker in the past which she stated had an adverse effect on her kidneys. She feels like it may have been Nadolol but was not sure. This was in a different state and she does not have access to those records.  She currently sees a nephrologist who monitors her kidney function.   She did state that once she stops the medication that is causing the fluid rentention that it goes down eventually on its own.   She currently takes spironolactone 25 mg daily and wanted to know if Dr. Fletcher Anon would refill this for her.

## 2018-09-25 NOTE — Telephone Encounter (Signed)
The pt has developed facial, hand and feet edema since starting carafate.  She was told to stop protonix and has developed return of her reflux.  She states she has burning in the chest and worse when lying down.  Has abd pain about an hour after eating.  Last carafate was this morning.  She was told not to take anymore carafate.  Please advise.

## 2018-09-25 NOTE — Telephone Encounter (Signed)
Left a message for the patient to call back.  

## 2018-09-25 NOTE — Telephone Encounter (Signed)
The pt has been advised of the information and verbalized understanding.    The pt has already been speaking with her cardiologist and nephrologist this morning about appt's.  She states she has tried ranitidine in the past and it doesn't work. She will try again and keep appts as scheduled for mano and Dr Carlean Purl

## 2018-09-25 NOTE — Telephone Encounter (Signed)
Swelling from amlodipine is usually cause by fluid retention and not allergic reaction.   Is the current swelling more like fluid retention or allergy? Any itching, facial swelling, etc? Noted patient has hx of swelling with metoprolol and acebutolol in the past.   We may bee trial with carvedilol 6.25mg  BID to replace Bystolic.

## 2018-09-26 NOTE — Telephone Encounter (Signed)
Returned the call to the patient. She stated that the swelling has gone down some today so she would prefer to stay on the Bystolic for now because this has also helped with her palpitations. She will call back if the swelling does not go down and she wants to resume the Quinapril.

## 2018-10-04 ENCOUNTER — Other Ambulatory Visit: Payer: Self-pay

## 2018-10-04 MED ORDER — LEVOTHYROXINE SODIUM 125 MCG PO TABS: 125.0000 ug | ORAL_TABLET | Freq: Every day | ORAL | 1 refills | Status: DC

## 2018-10-04 NOTE — Addendum Note (Signed)
Addended by: Chilton Greathouse on: 10/04/2018 03:15 PM   Modules accepted: Orders

## 2018-10-04 NOTE — Telephone Encounter (Signed)
Refill request for thyroid medication: Levothyroxine 125 mcg  Last Physical: None indicated   Lab Results  Component Value Date   TSH 1.600 06/27/2018    Follow-ups on file. None indicated

## 2018-10-04 NOTE — Addendum Note (Signed)
Addended by: LADA, Satira Anis on: 10/04/2018 05:57 PM   Modules accepted: Orders

## 2018-10-04 NOTE — Telephone Encounter (Signed)
TSH reviewed Rx approved

## 2018-10-08 ENCOUNTER — Other Ambulatory Visit: Payer: Medicare HMO

## 2018-10-09 ENCOUNTER — Telehealth: Payer: Self-pay | Admitting: Gastroenterology

## 2018-10-09 DIAGNOSIS — N390 Urinary tract infection, site not specified: Secondary | ICD-10-CM | POA: Diagnosis not present

## 2018-10-09 DIAGNOSIS — I10 Essential (primary) hypertension: Secondary | ICD-10-CM | POA: Diagnosis not present

## 2018-10-09 DIAGNOSIS — Z87442 Personal history of urinary calculi: Secondary | ICD-10-CM | POA: Diagnosis not present

## 2018-10-09 DIAGNOSIS — R600 Localized edema: Secondary | ICD-10-CM | POA: Diagnosis not present

## 2018-10-09 NOTE — Telephone Encounter (Signed)
I see detailed instructions have been sent to her via patient portal. That was done early this morning. Saw Anderson Malta last.

## 2018-10-09 NOTE — Telephone Encounter (Signed)
Pt called back wanting to speak with nurse to gv instructions for the procedure that she will be having at [facility] on 10/11/2018

## 2018-10-09 NOTE — Telephone Encounter (Signed)
The pt was notified that instructions were sent to the her via My Chart this morning by Rosanne Sack RN.  She will call with any further questions.

## 2018-10-11 ENCOUNTER — Encounter (HOSPITAL_COMMUNITY)
Admission: RE | Disposition: A | Discharge status: Home / Self Care | Payer: Self-pay | Source: Home / Self Care | Attending: Gastroenterology

## 2018-10-11 ENCOUNTER — Ambulatory Visit (HOSPITAL_COMMUNITY)
Admission: RE | Admit: 2018-10-11 | Discharge: 2018-10-11 | Disposition: A | Discharge status: Home / Self Care | Payer: Medicare HMO | Attending: Gastroenterology | Admitting: Gastroenterology

## 2018-10-11 DIAGNOSIS — R05 Cough: Secondary | ICD-10-CM | POA: Diagnosis not present

## 2018-10-11 DIAGNOSIS — K219 Gastro-esophageal reflux disease without esophagitis: Secondary | ICD-10-CM | POA: Diagnosis present

## 2018-10-11 DIAGNOSIS — R079 Chest pain, unspecified: Secondary | ICD-10-CM | POA: Diagnosis not present

## 2018-10-11 DIAGNOSIS — R12 Heartburn: Secondary | ICD-10-CM | POA: Diagnosis not present

## 2018-10-11 HISTORY — PX: 24 HOUR PH STUDY: SHX5419

## 2018-10-11 HISTORY — PX: PH IMPEDANCE STUDY: SHX5565

## 2018-10-11 HISTORY — PX: ESOPHAGEAL MANOMETRY: SHX5429

## 2018-10-11 SURGERY — MANOMETRY, ESOPHAGUS

## 2018-10-11 MED ORDER — LIDOCAINE VISCOUS HCL 2 % MT SOLN
OROMUCOSAL | Status: AC
  Filled 2018-10-11: qty 15

## 2018-10-11 SURGICAL SUPPLY — 2 items
FACESHIELD LNG OPTICON STERILE (SAFETY) IMPLANT
GLOVE BIO SURGEON STRL SZ8 (GLOVE) ×4 IMPLANT

## 2018-10-11 NOTE — Progress Notes (Signed)
Esophageal manometry performed per protocol.  Patient tolerated procedure without complications.  PH probe placed per protocol at 34cm at about 1324.  Patient tolerated procedure without complications.  Instructions and journal reviewed with patient.  Patient verbalized understanding and to return at about 1325 tomorrow.  Dr. Silverio Decamp to interpret results.

## 2018-10-13 ENCOUNTER — Encounter (HOSPITAL_COMMUNITY): Payer: Self-pay | Admitting: Gastroenterology

## 2018-10-13 ENCOUNTER — Other Ambulatory Visit (HOSPITAL_COMMUNITY)
Admission: RE | Admit: 2018-10-13 | Discharge: 2018-10-13 | Disposition: A | Discharge status: Home / Self Care | Payer: Medicare HMO | Source: Ambulatory Visit | Attending: Nurse Practitioner | Admitting: Nurse Practitioner

## 2018-10-13 ENCOUNTER — Ambulatory Visit (INDEPENDENT_AMBULATORY_CARE_PROVIDER_SITE_OTHER): Payer: Medicare HMO | Admitting: Nurse Practitioner

## 2018-10-13 VITALS — BP 124/82 | HR 92 | Temp 98.4°F | Resp 16 | Ht 65.0 in | Wt 184.3 lb

## 2018-10-13 DIAGNOSIS — J0101 Acute recurrent maxillary sinusitis: Secondary | ICD-10-CM | POA: Diagnosis not present

## 2018-10-13 DIAGNOSIS — N898 Other specified noninflammatory disorders of vagina: Secondary | ICD-10-CM

## 2018-10-13 DIAGNOSIS — L304 Erythema intertrigo: Secondary | ICD-10-CM

## 2018-10-13 MED ORDER — DOXYCYCLINE HYCLATE 100 MG PO TABS: 100.0000 mg | ORAL_TABLET | Freq: Two times a day (BID) | ORAL | 0 refills | Status: DC
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
## 2018-10-13 NOTE — Patient Instructions (Addendum)
-   Continue to use nasal rinse, can use flonase and take antibiotic with meals for entire course.  - keep underarms and under breasts as dry as you can, can take an antihistamine like claritin to help with itching or use hydrocortisone cream if needed for symptom relief - Sending of vaginal swab to test for appropriate infection and will call and send you the appropriate medications to the pharmacy. Do not douche, take probiotics and call us with any other concerning symptoms.    Intertrigo Intertrigo is skin irritation (inflammation) that happens in warm, moist areas of the body. The irritation can cause a rash and make skin raw and itchy. The rash is usually pink or red. It happens mostly between folds of skin or where skin rubs together, such as:  Toes.  Armpits.  Groin.  Belly.  Breasts.  Buttocks.  This condition is not passed from person to person (is not contagious). Follow these instructions at home:  Keep the affected area clean and dry.  Do not scratch your skin.  Stay cool as much as possible. Use an air conditioner or fan, if you can.  Apply over-the-counter and prescription medicines only as told by your doctor.  If you were prescribed an antibiotic medicine, use it as told by your doctor. Do not stop using the antibiotic even if your condition starts to get better.  Keep all follow-up visits as told by your doctor. This is important. How is this prevented?  Stay at a healthy weight.  Keep your feet dry. This is very important if you have diabetes. Wear cotton or wool socks.  Take care of and protect the skin in your groin and butt area as told by your doctor.  Do not wear tight clothes. Wear clothes that: ? Are loose. ? Take away moisture from your body. ? Are made of cotton.  Wear a bra that gives good support, if needed.  Shower and dry yourself fully after being active.  Keep your blood sugar under control if you have diabetes. Contact a doctor  if:  Your symptoms do not get better with treatment.  Your symptoms get worse or they spread.  You notice more redness and warmth.  You have a fever. This information is not intended to replace advice given to you by your health care provider. Make sure you discuss any questions you have with your health care provider. Document Released: 11/20/2010 Document Revised: 03/25/2016 Document Reviewed: 04/21/2015 Elsevier Interactive Patient Education  [employer].

## 2018-10-14 ENCOUNTER — Encounter: Payer: Self-pay | Admitting: Family Medicine

## 2018-10-17 LAB — CERVICOVAGINAL ANCILLARY ONLY
Bacterial vaginitis: POSITIVE — AB
Candida vaginitis: NEGATIVE
Chlamydia: NEGATIVE
NEISSERIA GONORRHEA: NEGATIVE
Trichomonas: NEGATIVE

## 2018-10-18 ENCOUNTER — Other Ambulatory Visit: Payer: Self-pay | Admitting: Nurse Practitioner

## 2018-10-18 DIAGNOSIS — B9689 Other specified bacterial agents as the cause of diseases classified elsewhere: Secondary | ICD-10-CM

## 2018-10-18 DIAGNOSIS — N760 Acute vaginitis: Principal | ICD-10-CM

## 2018-10-18 MED ORDER — METRONIDAZOLE 500 MG PO TABS: 500.0000 mg | ORAL_TABLET | Freq: Two times a day (BID) | ORAL | 0 refills | Status: DC

## 2018-10-29 DIAGNOSIS — R12 Heartburn: Secondary | ICD-10-CM

## 2018-10-29 DIAGNOSIS — R05 Cough: Secondary | ICD-10-CM

## 2018-10-31 ENCOUNTER — Encounter: Payer: Self-pay | Admitting: Family Medicine

## 2018-10-31 MED ORDER — FLUCONAZOLE 150 MG PO TABS: 150.0000 mg | ORAL_TABLET | Freq: Once | ORAL | 0 refills | Status: AC

## 2018-11-03 ENCOUNTER — Ambulatory Visit: Payer: Medicare Other | Admitting: Internal Medicine

## 2018-11-03 ENCOUNTER — Encounter: Payer: Self-pay | Admitting: Internal Medicine

## 2018-11-03 VITALS — BP 104/74 | HR 76 | Ht 64.17 in | Wt 181.0 lb

## 2018-11-03 DIAGNOSIS — K219 Gastro-esophageal reflux disease without esophagitis: Secondary | ICD-10-CM

## 2018-11-03 DIAGNOSIS — R1010 Upper abdominal pain, unspecified: Secondary | ICD-10-CM | POA: Diagnosis not present

## 2018-11-03 DIAGNOSIS — K224 Dyskinesia of esophagus: Secondary | ICD-10-CM

## 2018-11-03 MED ORDER — FAMOTIDINE 40 MG PO TABS: 40.0000 mg | ORAL_TABLET | Freq: Four times a day (QID) | ORAL | 1 refills | Status: DC
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
## 2018-11-03 NOTE — Patient Instructions (Signed)
  We have sent the following medications to your pharmacy for you to pick up at your convenience: pepcid   We will get your study results and be in touch.   I appreciate the opportunity to care for you. Silvano Rusk, MD, [facility]

## 2018-11-08 ENCOUNTER — Telehealth: Payer: Self-pay

## 2018-11-08 NOTE — Telephone Encounter (Signed)
-----   Message from Gatha Mayer, MD sent at 11/08/2018  2:33 PM EST ----- Regarding: ph IMpedance results Let her know that the pH impedance study she did was ok - NO abnormal reflux  At this point I recommend she give the famotidine a couple of months to see if that helps and see me prn

## 2018-11-08 NOTE — Telephone Encounter (Signed)
OK  The pH goes down some in all of [place] - there is a normal amount of that  She did not have abnormal frequency of low pH  I am sorry she still feels bad but I do not know what else to do  Am always willing to refer for another opinion at [place], [place] or [place]

## 2018-11-08 NOTE — Telephone Encounter (Signed)
I spoke with [facility] and she is going to try the famotidine as advised and will let us know if it doesn't help. I called her pharmacy and they didn't have her new insurance . I gave them Janett Billow)  the numbers over the phone and it ran thru for her 30 day supply. I will send in her 90 day supply to her Mail order tomorrow.

## 2018-11-08 NOTE — Telephone Encounter (Signed)
I spoke with [facility] and she is shocked that her test was normal. She said she watched the [place] drop from a 7/8 to a 2 continually during her 24 hour testing. She still has sores in her mouth from the acid after testing and still having the stomach pain. She said I'm not trying to double guess Dr Carlean Purl but I just can't believe it's normal with the amount of pain I've had. She wants me to send in a 30 day rx to her local pharmacy and 90 day to her new mail order pharmacy. I told her I would touch base to make sure the plan is still the famotidine 40mg  QID before I send rx's in. She today has taken her last OTC famotidine pill today which she was doing QID.

## 2018-11-09 ENCOUNTER — Telehealth: Payer: Self-pay | Admitting: Internal Medicine

## 2018-11-09 MED ORDER — FAMOTIDINE 40 MG PO TABS: 40.0000 mg | ORAL_TABLET | Freq: Four times a day (QID) | ORAL | 3 refills | Status: DC

## 2018-11-09 NOTE — Telephone Encounter (Signed)
Famotidine has been resent to [facility] as requested. Left patient voicemail message that rx has been resent.

## 2018-11-09 NOTE — Telephone Encounter (Signed)
90 day supply of famotidine sent to mail order as requested.

## 2018-11-09 NOTE — Telephone Encounter (Signed)
Pt called to inform that famotidine is on backorder at retail pharm.  Pt requested pscrp to be resent to [facility] order.  Pt states that pscrp has not been received and might need to be resent because pt had just set up an account online.

## 2018-11-10 ENCOUNTER — Telehealth: Payer: Self-pay | Admitting: Internal Medicine

## 2018-11-10 NOTE — Telephone Encounter (Signed)
Pt call in and advised that the prescription for famotidine 40mg  is out of stock long term for pharm and for [facility] she stated she was advised by them that they do have the 20mg .

## 2018-11-10 NOTE — Telephone Encounter (Signed)
2 x 20 = 40 Do that

## 2018-11-10 NOTE — Telephone Encounter (Signed)
Please advise Sir? I spoke with [facility] and she said the pharmacy's don't know when they will get it in.

## 2018-11-13 ENCOUNTER — Telehealth: Payer: Self-pay | Admitting: Internal Medicine

## 2018-11-13 NOTE — Telephone Encounter (Signed)
Fantastic news as this is less pills a day for her to take.

## 2018-11-13 NOTE — Telephone Encounter (Signed)
She called back and her local and mail order pharmacy's were able to get the 40 mg pills.

## 2018-11-13 NOTE — Telephone Encounter (Signed)
Local pharm was able to fill 40mg  famotidine she has a month supply and her drug mail order has the script as well she is good to go you do not need to call her back thanks

## 2018-11-13 NOTE — Telephone Encounter (Signed)
Local pharm was able to fill 40mg  famotidineshe has a month supply and her drug mail order has the script as well she is good to go you do not need to call her back thanks

## 2018-11-13 NOTE — Telephone Encounter (Signed)
I called Laura [name] and she said her mail order pharmacy contacted her and they are trying to get the 40 mg tablets. She is going to touch base with her local pharmacy as they have left her a message about a rx being ready. She wants to see if maybe they got the 40mg  from another source. She will call me back.

## 2018-12-05 ENCOUNTER — Encounter: Payer: Self-pay | Admitting: Family Medicine

## 2018-12-05 ENCOUNTER — Ambulatory Visit (INDEPENDENT_AMBULATORY_CARE_PROVIDER_SITE_OTHER): Payer: Commercial Managed Care - HMO | Admitting: Family Medicine

## 2018-12-05 VITALS — BP 134/80 | HR 92 | Temp 97.6°F | Resp 14 | Ht 64.0 in | Wt 185.0 lb

## 2018-12-05 DIAGNOSIS — E669 Obesity, unspecified: Secondary | ICD-10-CM | POA: Diagnosis not present

## 2018-12-05 DIAGNOSIS — J329 Chronic sinusitis, unspecified: Secondary | ICD-10-CM

## 2018-12-05 DIAGNOSIS — R109 Unspecified abdominal pain: Secondary | ICD-10-CM | POA: Diagnosis not present

## 2018-12-05 DIAGNOSIS — R12 Heartburn: Secondary | ICD-10-CM | POA: Diagnosis not present

## 2018-12-05 LAB — POCT URINALYSIS DIPSTICK
Bilirubin, UA: NEGATIVE
Blood, UA: NEGATIVE
Glucose, UA: NEGATIVE
Ketones, UA: NEGATIVE
LEUKOCYTES UA: NEGATIVE
Nitrite, UA: NEGATIVE
Protein, UA: NEGATIVE
Spec Grav, UA: 1.01 (ref 1.010–1.025)
Urobilinogen, UA: 0.2 E.U./dL
pH, UA: 6 (ref 5.0–8.0)

## 2018-12-05 NOTE — Assessment & Plan Note (Signed)
Talk to GI doctor about reaction to medicine; avoid triggers
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
## 2018-12-05 NOTE — Patient Instructions (Addendum)
We'll have you see the Ear Nose Throat doctor about your sinuses You can continue the saline rinses Please contact Dr. Candiss Norse about the quinapril if you think that causes swelling and urinary issues Please contact the GI doctor about your perceived reaction to the sucralfate We'll get an ultrasound of your kidneys  Check out the information at [email or web address].org entitled "Nutrition for Weight Loss: What You Need to Know about Fad Diets" Try to lose between 1-2 pounds per week by taking in fewer calories and burning off more calories You can succeed by limiting portions, limiting foods dense in calories and fat, becoming more active, and drinking 8 glasses of water a day (64 ounces) Don't skip meals, especially breakfast, as skipping meals may alter your metabolism Do not use over-the-counter weight loss pills or gimmicks that claim rapid weight loss A healthy BMI (or body mass index) is between 18.5 and 24.9 You can calculate your ideal BMI at the [place] website ClubMonetize.fr  Caution: prolonged use of proton pump inhibitors like omeprazole (Prilosec), pantoprazole (Protonix), esomeprazole (Nexium), and others like Dexilant and Aciphex may increase your risk of pneumonia, Clostridium difficile colitis, osteoporosis, anemia and other health complications Try to limit or avoid triggers like coffee, caffeinated beverages, onions, chocolate, spicy foods, peppermint, acidic foods like pizza, spaghetti sauce, and orange juice Lose weight if you are overweight or obese Try elevating the head of your bed by placing a small wedge between your mattress and box springs to keep acid in the stomach at night instead of coming up into your esophagus  Obesity, Adult Obesity is the condition of having too much total body fat. Being overweight or obese means that your weight is greater than what is considered healthy for your body size. Obesity is determined by a  measurement called BMI. BMI is an estimate of body fat and is calculated from height and weight. For adults, a BMI of 30 or higher is considered obese. Obesity can eventually lead to other health concerns and major illnesses, including:  Stroke.  Coronary artery disease (CAD).  Type 2 diabetes.  Some types of cancer, including cancers of the colon, breast, uterus, and gallbladder.  Osteoarthritis.  High blood pressure (hypertension).  High cholesterol.  Sleep apnea.  Gallbladder stones.  Infertility problems. What are the causes? The main cause of obesity is taking in (consuming) more calories than your body uses for energy. Other factors that contribute to this condition may include:  Being born with genes that make you more likely to become obese.  Having a medical condition that causes obesity. These conditions include: ? Hypothyroidism. ? Polycystic ovarian syndrome (PCOS). ? Binge-eating disorder. ? Cushing syndrome.  Taking certain medicines, such as steroids, antidepressants, and seizure medicines.  Not being physically active (sedentary lifestyle).  Living where there are limited places to exercise safely or buy healthy foods.  Not getting enough sleep. What increases the risk? The following factors may increase your risk of this condition:  Having a family history of obesity.  Being a woman of African-American descent.  Being a man of Hispanic descent. What are the signs or symptoms? Having excessive body fat is the main symptom of this condition. How is this diagnosed? This condition may be diagnosed based on:  Your symptoms.  Your medical history.  A physical exam. Your health care provider may measure: ? Your BMI. If you are an adult with a BMI between 25 and less than 30, you are considered overweight. If you are an adult  with a BMI of 30 or higher, you are considered obese. ? The distances around your hips and your waist (circumferences). These  may be compared to each other to help diagnose your condition. ? Your skinfold thickness. Your health care provider may gently pinch a fold of your skin and measure it. How is this treated? Treatment for this condition often includes changing your lifestyle. Treatment may include some or all of the following:  Dietary changes. Work with your health care provider and a dietitian to set a weight-loss goal that is healthy and reasonable for you. Dietary changes may include eating: ? Smaller portions. A portion size is the amount of a particular food that is healthy for you to eat at one time. This varies from person to person. ? Low-calorie or low-fat options. ? More whole grains, fruits, and vegetables.  Regular physical activity. This may include aerobic activity (cardio) and strength training.  Medicine to help you lose weight. Your health care provider may prescribe medicine if you are unable to lose 1 pound a week after 6 weeks of eating more healthily and doing more physical activity.  Surgery. Surgical options may include gastric banding and gastric bypass. Surgery may be done if: ? Other treatments have not helped to improve your condition. ? You have a BMI of 40 or higher. ? You have life-threatening health problems related to obesity. Follow these instructions at home:  Eating and drinking   Follow recommendations from your health care provider about what you eat and drink. Your health care provider may advise you to: ? Limit fast foods, sweets, and processed snack foods. ? Choose low-fat options, such as low-fat milk instead of whole milk. ? Eat 5 or more servings of fruits or vegetables every day. ? Eat at home more often. This gives you more control over what you eat. ? Choose healthy foods when you eat out. ? Learn what a healthy portion size is. ? Keep low-fat snacks on hand. ? Avoid sugary drinks, such as soda, fruit juice, iced tea sweetened with sugar, and flavored  milk. ? Eat a healthy breakfast.  Drink enough water to keep your urine clear or pale yellow.  Do not go without eating for long periods of time (do not fast) or follow a fad diet. Fasting and fad diets can be unhealthy and even dangerous. Physical Activity  Exercise regularly, as told by your health care provider. Ask your health care provider what types of exercise are safe for you and how often you should exercise.  Warm up and stretch before being active.  Cool down and stretch after being active.  Rest between periods of activity. Lifestyle  Limit the time that you spend in front of your TV, computer, or video game system.  Find ways to reward yourself that do not involve food.  Limit alcohol intake to no more than 1 drink a day for nonpregnant women and 2 drinks a day for men. One drink equals 12 oz of beer, 5 oz of wine, or 1 oz of hard liquor. General instructions  Keep a weight loss journal to keep track of the food you eat and how much you exercise you get.  Take over-the-counter and prescription medicines only as told by your health care provider.  Take vitamins and supplements only as told by your health care provider.  Consider joining a support group. Your health care provider may be able to recommend a support group.  Keep all follow-up visits as told  by your health care provider. This is important. Contact a health care provider if:  You are unable to meet your weight loss goal after 6 weeks of dietary and lifestyle changes. This information is not intended to replace advice given to you by your health care provider. Make sure you discuss any questions you have with your health care provider. Document Released: 11/25/2004 Document Revised: 03/22/2016 Document Reviewed: 08/06/2015 Elsevier Interactive Patient Education  2019 Elsevier Inc.  Preventing Unhealthy [employer], Adult Staying at a healthy weight is important to your overall health. When fat builds  up in your body, you may become overweight or obese. Being overweight or obese increases your risk of developing certain health problems, such as heart disease, diabetes, sleeping problems, joint problems, and some types of cancer. Unhealthy weight gain is often the result of making unhealthy food choices or not getting enough exercise. You can make changes to your lifestyle to prevent obesity and stay as healthy as possible. What nutrition changes can be made?   Eat only as much as your body needs. To do this: ? Pay attention to signs that you are hungry or full. Stop eating as soon as you feel full. ? If you feel hungry, try drinking water first before eating. Drink enough water so your urine is clear or pale yellow. ? Eat smaller portions. Pay attention to portion sizes when eating out. ? Look at serving sizes on food labels. Most foods contain more than one serving per container. ? Eat the recommended number of calories for your gender and activity level. For most active people, a daily total of 2,000 calories is appropriate. If you are trying to lose weight or are not very active, you may need to eat fewer calories. Talk with your health care provider or a diet and nutrition specialist (dietitian) about how many calories you need each day.  Choose healthy foods, such as: ? Fruits and vegetables. At each meal, try to fill at least half of your plate with fruits and vegetables. ? Whole grains, such as whole-wheat bread, brown rice, and quinoa. ? Lean meats, such as chicken or fish. ? Other healthy proteins, such as beans, eggs, or tofu. ? Healthy fats, such as nuts, seeds, fatty fish, and olive oil. ? Low-fat or fat-free dairy products.  Check food labels, and avoid food and drinks that: ? Are high in calories. ? Have added sugar. ? Are high in sodium. ? Have saturated fats or trans fats.  Cook foods in healthier ways, such as by baking, broiling, or grilling.  Make a meal plan for the  week, and shop with a grocery list to help you stay on track with your purchases. Try to avoid going to the grocery store when you are hungry.  When grocery shopping, try to shop around the outside of the store first, where the fresh foods are. Doing this helps you to avoid prepackaged foods, which can be high in sugar, salt (sodium), and fat. What lifestyle changes can be made?   Exercise for 30 or more minutes on 5 or more days each week. Exercising may include brisk walking, yard work, biking, running, swimming, and team sports like basketball and soccer. Ask your health care provider which exercises are safe for you.  Do muscle-strengthening activities, such as lifting weights or using resistance bands, on 2 or more days a week.  Do not use any products that contain nicotine or tobacco, such as cigarettes and e-cigarettes. If you need help  quitting, ask your health care provider.  Limit alcohol intake to no more than 1 drink a day for nonpregnant women and 2 drinks a day for men. One drink equals 12 oz of beer, 5 oz of wine, or 1 oz of hard liquor.  Try to get 7-9 hours of sleep each night. What other changes can be made?  Keep a food and activity journal to keep track of: ? What you ate and how many calories you had. Remember to count the calories in sauces, dressings, and side dishes. ? Whether you were active, and what exercises you did. ? Your calorie, weight, and activity goals.  Check your weight regularly. Track any changes. If you notice you have gained weight, make changes to your diet or activity routine.  Avoid taking weight-loss medicines or supplements. Talk to your health care provider before starting any new medicine or supplement.  Talk to your health care provider before trying any new diet or exercise plan. Why are these changes important? Eating healthy, staying active, and having healthy habits can help you to prevent obesity. Those changes also:  Help you  manage stress and emotions.  Help you connect with friends and family.  Improve your self-esteem.  Improve your sleep.  Prevent long-term health problems. What can happen if changes are not made? Being obese or overweight can cause you to develop joint or bone problems, which can make it hard for you to stay active or do activities you enjoy. Being obese or overweight also puts stress on your heart and lungs and can lead to health problems like diabetes, heart disease, and some cancers. Where to find more information Talk with your health care provider or a dietitian about healthy eating and healthy lifestyle choices. You may also find information from:  U.S. Department of Agriculture, MyPlate: FormerBoss.no  American Heart Association: www.heart.org  Centers for Disease Control and Prevention: http://www.wolf.info/ Summary  Staying at a healthy weight is important to your overall health. It helps you to prevent certain diseases and health problems, such as heart disease, diabetes, joint problems, sleep disorders, and some types of cancer.  Being obese or overweight can cause you to develop joint or bone problems, which can make it hard for you to stay active or do activities you enjoy.  You can prevent unhealthy weight gain by eating a healthy diet, exercising regularly, not smoking, limiting alcohol, and getting enough sleep.  Talk with your health care provider or a dietitian for guidance about healthy eating and healthy lifestyle choices. This information is not intended to replace advice given to you by your health care provider. Make sure you discuss any questions you have with your health care provider. Document Released: 10/19/2016 Document Revised: 07/29/2017 Document Reviewed: 11/24/2016 Elsevier Interactive Patient Education  2019 [employer].

## 2018-12-05 NOTE — Assessment & Plan Note (Signed)
Encouraged weight loss, see AVS 

## 2018-12-08 ENCOUNTER — Encounter: Payer: Self-pay | Admitting: Emergency Medicine

## 2018-12-08 ENCOUNTER — Other Ambulatory Visit: Payer: Self-pay

## 2018-12-08 ENCOUNTER — Emergency Department: Payer: Medicare Other

## 2018-12-08 ENCOUNTER — Ambulatory Visit: Payer: Self-pay | Admitting: *Deleted

## 2018-12-08 ENCOUNTER — Emergency Department
Admission: EM | Admit: 2018-12-08 | Discharge: 2018-12-08 | Disposition: A | Discharge status: Home / Self Care | Payer: Medicare Other | Attending: Emergency Medicine | Admitting: Emergency Medicine

## 2018-12-08 DIAGNOSIS — J449 Chronic obstructive pulmonary disease, unspecified: Secondary | ICD-10-CM | POA: Insufficient documentation

## 2018-12-08 DIAGNOSIS — M7918 Myalgia, other site: Secondary | ICD-10-CM | POA: Diagnosis not present

## 2018-12-08 DIAGNOSIS — Z79899 Other long term (current) drug therapy: Secondary | ICD-10-CM | POA: Diagnosis not present

## 2018-12-08 DIAGNOSIS — R202 Paresthesia of skin: Secondary | ICD-10-CM | POA: Insufficient documentation

## 2018-12-08 DIAGNOSIS — R079 Chest pain, unspecified: Secondary | ICD-10-CM

## 2018-12-08 DIAGNOSIS — Z87891 Personal history of nicotine dependence: Secondary | ICD-10-CM | POA: Diagnosis not present

## 2018-12-08 DIAGNOSIS — R531 Weakness: Secondary | ICD-10-CM | POA: Diagnosis not present

## 2018-12-08 DIAGNOSIS — I10 Essential (primary) hypertension: Secondary | ICD-10-CM | POA: Diagnosis not present

## 2018-12-08 DIAGNOSIS — J45909 Unspecified asthma, uncomplicated: Secondary | ICD-10-CM | POA: Insufficient documentation

## 2018-12-08 LAB — URINALYSIS, COMPLETE (UACMP) WITH MICROSCOPIC
Bacteria, UA: NONE SEEN
Bilirubin Urine: NEGATIVE
Glucose, UA: NEGATIVE mg/dL
Hgb urine dipstick: NEGATIVE
Ketones, ur: NEGATIVE mg/dL
Leukocytes, UA: NEGATIVE
Nitrite: NEGATIVE
Protein, ur: NEGATIVE mg/dL
SPECIFIC GRAVITY, URINE: 1.012 (ref 1.005–1.030)
pH: 7 (ref 5.0–8.0)

## 2018-12-08 LAB — CBC
HCT: 44.5 % (ref 36.0–46.0)
Hemoglobin: 14.3 g/dL (ref 12.0–15.0)
MCH: 27.9 pg (ref 26.0–34.0)
MCHC: 32.1 g/dL (ref 30.0–36.0)
MCV: 86.7 fL (ref 80.0–100.0)
Platelets: 291 10*3/uL (ref 150–400)
RBC: 5.13 MIL/uL — ABNORMAL HIGH (ref 3.87–5.11)
RDW: 14 % (ref 11.5–15.5)
WBC: 7.2 10*3/uL (ref 4.0–10.5)
nRBC: 0 % (ref 0.0–0.2)

## 2018-12-08 LAB — BASIC METABOLIC PANEL
Anion gap: 7 (ref 5–15)
BUN: 26 mg/dL — ABNORMAL HIGH (ref 6–20)
CO2: 30 mmol/L (ref 22–32)
Calcium: 9.8 mg/dL (ref 8.9–10.3)
Chloride: 101 mmol/L (ref 98–111)
Creatinine, Ser: 0.77 mg/dL (ref 0.44–1.00)
GFR calc Af Amer: >60 mL/min (ref >60)
GFR calc non Af Amer: >60 mL/min (ref >60)
Glucose, Bld: 89 mg/dL (ref 70–99)
Potassium: 3.9 mmol/L (ref 3.5–5.1)
Sodium: 138 mmol/L (ref 135–145)

## 2018-12-08 LAB — TROPONIN I

## 2018-12-08 MED ORDER — PREDNISONE 20 MG PO TABS
60.0000 mg | ORAL_TABLET | Freq: Once | ORAL | Status: AC
  Administered 2018-12-08: 60 mg via ORAL
  Filled 2018-12-08: qty 3

## 2018-12-08 MED ORDER — SODIUM CHLORIDE 0.9% FLUSH: 3.0000 mL | Freq: Once | INTRAVENOUS | Status: DC

## 2018-12-08 MED ORDER — PREDNISONE 10 MG PO TABS: 10.0000 mg | ORAL_TABLET | Freq: Every day | ORAL | 0 refills | Status: DC

## 2018-12-08 NOTE — ED Triage Notes (Signed)
Pt in via POV with complaints of intermittent chest pain and palpitations since yesterday, also reports incident yesterday where both legs gave out on her, w/ intermittent numbness/tingling to bilateral arms/legs.  Pt neurologically intact at this time, hypertensive upon arrival, other vitals WDL.  NAD noted at this time.

## 2018-12-08 NOTE — ED Notes (Addendum)
Pt reports yesterday while mopping the floor her knees got shaky and her legs became weak - she reports "heat going to my private parts" Since then she is having tinlging in arms, hand, legs, feet - chest pain with palpitations (hx of anxiety with same symptoms) - lower back pain for 4-6 weeks off and on with spasms - bilat hip pain - STATES HAS FIBROMYALGIA
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
## 2018-12-08 NOTE — ED Notes (Signed)
Reviewed discharge instructions, follow-up care, and prescriptions with patient. Patient verbalized understanding of all information reviewed. Patient stable, with no distress noted at this time.    

## 2018-12-08 NOTE — Telephone Encounter (Signed)
Pt called with complaints of sudden shakiness  In her knees, feeling like she was losing control of her legs, and a feeling of heat in her vaginal area on 12/07/2018; she also felt faint at that time; the pt says that her legs are still hot and tingling, as well as her hands;the pt; the pt also complains of ongoing back pain (back of rib cage R>L), and swelling in her legs which is not abnormal; she also says that her hands turn blue intermittently but is not abnormal due to her Raynaud's; the pt says that she intermittently has tingling; recommendations made per nurse triage protocol; [facility] with Cassandra at [facility]; pt directed to proceed to ED; she verbalized understand; will route to office for notification of this encounter.  Reason for Disposition . Patient sounds very sick or weak to the triager  Answer Assessment - Initial Assessment Questions 1. SYMPTOM: "What is the main symptom you are concerned about?" (e.g., weakness, numbness)     Tingling in legs and hands 2. ONSET: "When did this start?" (minutes, hours, days; while sleeping)     12/07/2018 3. LAST NORMAL: "When was the last time you were normal (no symptoms)?"     12/07/2018 4. PATTERN "Does this come and go, or has it been constant since it started?"  "Is it present now?"     constant 5. CARDIAC SYMPTOMS: "Have you had any of the following symptoms: chest pain, difficulty breathing, palpitations?"     Pt has palpitations and chest pain due to anxiety all of the time  6. NEUROLOGIC SYMPTOMS: "Have you had any of the following symptoms: headache, dizziness, vision loss, double vision, changes in speech, unsteady on your feet?"     Leg weakness, and tingling 7. OTHER SYMPTOMS: "Do you have any other symptoms?"   8. PREGNANCY: "Is there any chance you are pregnant?" "When was your last menstrual period?"  no hysterectomy  Protocols used: NEUROLOGIC DEFICIT-A-AH

## 2018-12-11 ENCOUNTER — Other Ambulatory Visit: Payer: Self-pay | Admitting: Internal Medicine

## 2018-12-12 ENCOUNTER — Encounter: Payer: Self-pay | Admitting: Family Medicine

## 2018-12-12 NOTE — Telephone Encounter (Signed)
Please check on ENT referral and respond to patient

## 2018-12-14 ENCOUNTER — Ambulatory Visit: Payer: Medicare Other

## 2018-12-19 ENCOUNTER — Ambulatory Visit: Payer: Medicare HMO | Admitting: Internal Medicine

## 2019-01-17 ENCOUNTER — Other Ambulatory Visit: Payer: Self-pay | Admitting: Family Medicine

## 2019-01-17 MED ORDER — LEVOTHYROXINE SODIUM 125 MCG PO TABS: 125.0000 ug | ORAL_TABLET | Freq: Every day | ORAL | 1 refills | Status: DC

## 2019-01-17 NOTE — Telephone Encounter (Signed)
Lab Results  Component Value Date   TSH 1.600 06/27/2018    Rx was teed up to go to local pharmacy Denied as it was not time for refill to local pharmacy Note says [facility] entered and sent to [facility]

## 2019-01-17 NOTE — Telephone Encounter (Signed)
Copied from [place] [id number]. Topic: Quick Communication - Rx Refill/Question >> Jan 17, 2019 10:08 AM Sheran Luz wrote: Medication: levothyroxine (SYNTHROID, LEVOTHROID) 125 MCG tablet   Patient is requesting a refill of this medication.   Preferred Pharmacy (with phone number or street name):[place], [place] [facility]  [phone] (Phone) [phone] (Fax)

## 2019-03-19 ENCOUNTER — Telehealth: Payer: Medicare Other | Admitting: Physician Assistant

## 2019-03-19 ENCOUNTER — Other Ambulatory Visit: Payer: Self-pay | Admitting: Physician Assistant

## 2019-03-19 DIAGNOSIS — B9789 Other viral agents as the cause of diseases classified elsewhere: Secondary | ICD-10-CM | POA: Diagnosis not present

## 2019-03-19 DIAGNOSIS — J329 Chronic sinusitis, unspecified: Secondary | ICD-10-CM

## 2019-03-19 MED ORDER — FLUTICASONE PROPIONATE 50 MCG/ACT NA SUSP: 2.0000 | Freq: Every day | NASAL | 0 refills | Status: DC

## 2019-03-19 NOTE — Progress Notes (Signed)
We are sorry that you are not feeling well.  Here is how we plan to help!  Based on what you have shared with me it looks like you have sinusitis.  Sinusitis is inflammation and infection in the sinus cavities of the head.  Based on your presentation I believe you most likely have Acute Viral Sinusitis.This is an infection most likely caused by a virus. There is not specific treatment for viral sinusitis other than to help you with the symptoms until the infection runs its course.  You may use an oral decongestant such as Mucinex D or if you have glaucoma or high blood pressure use plain Mucinex. Saline nasal spray help and can safely be used as often as needed for congestion, I have prescribed: Fluticasone nasal spray two sprays in each nostril once a day  Some authorities believe that zinc sprays or the use of Echinacea may shorten the course of your symptoms.  Sinus infections are not as easily transmitted as other respiratory infection, however we still recommend that you avoid close contact with loved ones, especially the very young and elderly.  Remember to wash your hands thoroughly throughout the day as this is the number one way to prevent the spread of infection!  Home Care:  Only take medications as instructed by your medical team.  Do not take these medications with alcohol.  A steam or ultrasonic humidifier can help congestion.  You can place a towel over your head and breathe in the steam from hot water coming from a faucet.  Avoid close contacts especially the very young and the elderly.  Cover your mouth when you cough or sneeze.  Always remember to wash your hands.  Get Help Right Away If:  You develop worsening fever or sinus pain.  You develop a severe head ache or visual changes.  Your symptoms persist after you have completed your treatment plan.  Make sure you  Understand these instructions.  Will watch your condition.  Will get help right away if you are not  doing well or get worse.  Your e-visit answers were reviewed by a board certified advanced clinical practitioner to complete your personal care plan.  Depending on the condition, your plan could have included both over the counter or prescription medications.  If there is a problem please reply  once you have received a response from your provider.  Your safety is important to [place].  If you have drug allergies check your prescription carefully.    You can use MyChart to ask questions about today's visit, request a non-urgent call back, or ask for a work or school excuse for 24 hours related to this e-Visit. If it has been greater than 24 hours you will need to follow up with your provider, or enter a new e-Visit to address those concerns.  You will get an e-mail in the next two days asking about your experience.  I hope that your e-visit has been valuable and will speed your recovery. Thank you for using e-visits.  I have spent 7 min in completion and review of this note- Lacy Duverney [facility]

## 2019-04-02 DIAGNOSIS — J329 Chronic sinusitis, unspecified: Secondary | ICD-10-CM | POA: Diagnosis not present

## 2019-04-02 DIAGNOSIS — J3489 Other specified disorders of nose and nasal sinuses: Secondary | ICD-10-CM | POA: Diagnosis not present

## 2019-04-02 DIAGNOSIS — J301 Allergic rhinitis due to pollen: Secondary | ICD-10-CM | POA: Diagnosis not present

## 2019-04-16 ENCOUNTER — Telehealth: Payer: Self-pay | Admitting: Cardiovascular Disease

## 2019-04-16 NOTE — Telephone Encounter (Signed)
Pending

## 2019-04-19 NOTE — Telephone Encounter (Signed)
Attempted to call the patient. No answer- I left a message to please call back.  

## 2019-04-19 NOTE — Telephone Encounter (Signed)
Patient sent this response with mychart consent for visit

## 2019-04-20 NOTE — Telephone Encounter (Signed)
Pt c/o swelling: STAT is pt has developed SOB within 24 hours  1) How much weight have you gained and in what time span?  10 lbs 6 weeks   2) If swelling, where is the swelling located?  BLE more on Right and morning face and hands   3) Are you currently taking a fluid pill? yes  4) Are you currently SOB? Yes but maybe related to asthma   5) Do you have a log of your daily weights (if so, list)?  No   6) Have you gained 3 pounds in a day or 5 pounds in a week? Yes   7) Have you traveled recently?  no

## 2019-04-20 NOTE — Telephone Encounter (Signed)
Call to patient to let her know the recommendations from Ignacia Bayley, NP.   She was agreeable with plan and preferred to have labs taken when she is in clinic next week. Advised pt to call for any further questions or concerns.

## 2019-04-20 NOTE — Telephone Encounter (Signed)
I'd like for her to have a bmet and bnp w/ in office f/u next week.  She may take an extra 1/2 of torsemide x 1, but I'd prefer to see labs before advising any additional adjustment to diuretic dosing.  Ideally she could have these labs drawn STAT over at the lab now, so that we get a result w/in the next couple of hours.

## 2019-04-20 NOTE — Telephone Encounter (Signed)
Call to patient to discuss recent weight gain and SOB.   Pt denies cough, fever, chills. No known exposure to CV 19 virus.   She reports ongoing BLE swelling, worse on R at [facility]. Weight has increased over the past week. Having SOB on exertion. No audible distress noted. Pt speaks in full sentences. Pleasant and laughing during call. Pt does report that she has asthma at [facility] which may be r/t SOB  She denies chest pain. Pt has ongoing heart palpitations, "which has been going on for years".   6/8 176 lbs 6/15 188.2 lbs 6/19 184 lbs.  She reports following a low salt, heart healthy diet.   Pt has maintained torsemide dose, 100 mg daily.  No vs available today.   I scheduled her for first available appt with Ignacia Bayley, NP on 6/25 @ 11 am.   Routing to provider to see if there is anything we should do in the meantime.

## 2019-04-24 ENCOUNTER — Telehealth: Payer: Self-pay

## 2019-04-24 NOTE — Telephone Encounter (Signed)

## 2019-04-26 ENCOUNTER — Ambulatory Visit: Payer: Medicare Other | Admitting: Physician Assistant

## 2019-04-26 ENCOUNTER — Other Ambulatory Visit: Payer: Self-pay

## 2019-04-26 ENCOUNTER — Encounter: Payer: Self-pay | Admitting: Physician Assistant

## 2019-04-26 VITALS — BP 132/80 | HR 69 | Temp 97.2°F | Ht 65.0 in | Wt 184.5 lb

## 2019-04-26 DIAGNOSIS — I10 Essential (primary) hypertension: Secondary | ICD-10-CM | POA: Diagnosis not present

## 2019-04-26 DIAGNOSIS — E785 Hyperlipidemia, unspecified: Secondary | ICD-10-CM | POA: Diagnosis not present

## 2019-04-26 DIAGNOSIS — I493 Ventricular premature depolarization: Secondary | ICD-10-CM | POA: Diagnosis not present

## 2019-04-26 DIAGNOSIS — R079 Chest pain, unspecified: Secondary | ICD-10-CM

## 2019-04-26 MED ORDER — QUINAPRIL HCL 20 MG PO TABS: 20.0000 mg | ORAL_TABLET | Freq: Two times a day (BID) | ORAL | 3 refills | Status: DC

## 2019-04-26 NOTE — Progress Notes (Signed)
Office Visit    Patient Name: Laura [name] Date of Encounter: 04/26/2019  Primary Care Provider:  Arnetha Courser, MD Primary Cardiologist:  Kathlyn Sacramento, MD - Patient will f/u in [place] in the future  Chief Complaint    53 year old female who presents for follow-up of palpitations and swelling, as well as newly noted chest pain.  Past Medical History    Past Medical History:  Diagnosis Date   Anxiety and depression    Asthma    Bacterial vaginitis    Bowel obstruction ([place])    Chest pain    a. 01/2016 Ex MV: Hypertensive response. Freq PVCs w/ exercise. nl EF. No ST/T changes. No ischemia.   COPD (chronic obstructive pulmonary disease) (HCC)    Cystocele    Elevated fasting blood sugar    Exposure to hepatitis C    Fibromyalgia    GERD (gastroesophageal reflux disease)    Heart murmur    a. 03/2016 Echo: EF 60-65%, no rwma, mild MR, nl LA size, nl RV fxn.   High cholesterol    Hypertension    Hypokalemia    IBS (irritable bowel syndrome)    Increased BMI    Kidney stones    Nasal septal perforation 05/12/2018   Hx of cocaine use   Osteoarthritis    Palpitations    a. 03/2016 Holter: Sinus rhythm, avg HR 83, max 123, min 64. 4 PACs. 10,356 isolated PVCs, one vent couplet, 3842 V bigeminy, 4 beats NSVT->prev on BB - dc 2/2 swelling.   Prediabetes 12/23/2015   Overview:  Hba1c higher but not diabetic. Took metformin to try to lessen   Raynaud disease    Rectocele    Sleep apnea    Urinary retention with incomplete bladder emptying    Vaginal dryness, menopausal    Vaginal enterocele    Vitamin D deficiency 12/03/2014   Yeast infection    Past Surgical History:  Procedure Laterality Date   17 HOUR [place] STUDY N/A 10/11/2018   Procedure: 24 HOUR PH STUDY;  Surgeon: Mauri Pole, MD;  Location: [facility];  Service: Endoscopy;  Laterality: N/A;   ABDOMINAL HYSTERECTOMY     ANKLE SURGERY     ran over by mother in car by  [place]  2015   posterior and enterocele ligation   CYSTOSCOPY  04/11/2017   Procedure: CYSTOSCOPY;  Surgeon: Defrancesco, Alanda Slim, MD;  Location: [facility];  Service: Gynecology;;   ESOPHAGEAL MANOMETRY N/A 10/11/2018   Procedure: ESOPHAGEAL MANOMETRY (EM);  Surgeon: Mauri Pole, MD;  Location: [facility];  Service: Endoscopy;  Laterality: N/A;   LAPAROSCOPIC SALPINGO OOPHERECTOMY Left 04/11/2017   Procedure: LAPAROSCOPIC LEFT SALPINGO OOPHORECTOMY;  Surgeon: Brayton Mars, MD;  Location: [facility];  Service: Gynecology;  Laterality: Left;   LITHOTRIPSY     OOPHORECTOMY     PARTIAL HYSTERECTOMY     [place] IMPEDANCE STUDY N/A 10/11/2018   Procedure: [place] IMPEDANCE STUDY;  Surgeon: Mauri Pole, MD;  Location: [facility];  Service: Endoscopy;  Laterality: N/A;   thumb surgery     UPPER GASTROINTESTINAL ENDOSCOPY      Allergies  Allergies  Allergen Reactions   Meperidine Hives   Shellfish Allergy Shortness Of Breath and Swelling   Diltiazem Swelling   Acebutolol Swelling   Amlodipine     Swelling    Codeine Hives and Nausea And Vomiting  Cymbalta [Duloxetine Hcl] Other (See Comments)    Made pt feel crazy   Dexilant [Dexlansoprazole] Other (See Comments)    Abdominal pain   Hydrocodone Other (See Comments)    Keeps patient awake.   Losartan     Swelling    Metoprolol Swelling   Mirtazapine Swelling   Omeprazole     Abdominal pain   Oxycodone Itching   Sectral [Acebutolol Hcl] Swelling   Tramadol     Unable to sleep, makes her itch    History of Present Illness    53 year old female with history of refractory hypertension, symptomatic PVCs, chronic atypical chest pain, fibromyalgia, remote cocaine abuse, obesity, OSA on CPAP with intermittent compliance, asthma, anxiety, and depression who presents for follow-up of palpitations and edema with new complaint of chest pain at  presentation. Of note, she has a history of adverse reactions to antihypertensive medications, which has complicated medical therapy.    She was previously followed by Dr. Yvone Neu and more recently established with Dr. Fletcher Anon.  She had a negative stress test 01/2016.  03/2016 Holter monitor showed 10,000 PVCs.  03/2016 echo showed normal EF.  She has been evaluated by nephrology for refractory hypertension and found to have discrepancy in renal size but no renal artery stenosis.  She was seen by Christell Faith, PA-C 06/2018 with complaint of increased palpitations.  Subsequent ZIO monitor evaluation performed showed 5 short runs of SVT with occasional PVCs and burden of 2.6%.  She was chest pain-free but recommendation was to undergo [facility], which showed no evidence of ischemia and normal EF.  Repeat echo for continued DOE showed normal EF and wall motion.  At her 09/2018 f/u with Dr. Fletcher Anon, she reported her palpitations were worse and started on Bystolic 5mg  once daily with Quinapril held at that time; however, after this change of medications, she reported upper extremity swelling with Bystolic, and Quinapril 20mg  po qd was restarted with Bystolic discontinued.    Since that time, she has had increasing fatigue, shortness of breath, palpitations, and intermittent episodes of chest pain over the last 3 to 4 months. She noted CP at both rest and with exertion and described as central, non-pleuritic, non-radiating, and a "tightness in the center of the chest" that typically lasts around an hour though "sometimes more, sometimes less."  She was unable to report if the chest pain was tender to palpation in the past or during today's exam, given her fibromyalgia. She denied association with food change in position. The chest pain sometimes occured with her palpitations but other times had no identifiable triggers and occurred without palpitations.  She reported that the chest pain was similar but worse in severity  than the CP reported that resulted in her 2017 stress test as above.  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  She continued to note worsening palpitations "to the point of sometimes causing nausea."  She was monitoring her blood pressure at home on a twice daily basis and noted that her SBP wss usually 130s in the morning but increased to 150s at night.  She continued to complain of lower extremity edema and reported recently needing to take an extra half tablet of her torsemide 100 mg daily due to a 10 pound weight increase and associated leg swelling.  She stated that her lower extremity edema usually declined throughout the day  and ended up changing to edema in her upper extremities overnight, specifically noting swelling in her legs/face/and upper body when she wakes up in the morning, which she attributed to lying down.  She stated she weighed herself daily with weight varying 5-10 lbs each day.  She did note that she felt the semi-controlled her edema much better than the Lasix.  Of note, she noted that her fatigue, shortness of breath, palpitations, chest pain episodes, and edema always were worse at night.  She reported medication compliance but had discontinued her statin 1 year ago on her own due to myalgias without follow-up lipids since that time.  She reported walking outside for regular exercise and had noted increasing shortness of breath/dyspnea on exertion from that of baseline and worse when associated with CP.    Home Medications    Prior to Admission medications   Medication Sig Start Date End Date Taking? Authorizing Provider  albuterol (PROVENTIL HFA;VENTOLIN HFA) 108 (90 Base) MCG/ACT inhaler Inhale 2 puffs into the lungs every 4 (four) hours as needed for wheezing or shortness of breath. 06/30/18  Yes Poulose, Bethel Born, NP  ALPRAZolam Duanne Moron) 1 MG  tablet Take 1 mg by mouth 4 (four) times daily.    Yes [provider]  amphetamine-dextroamphetamine (ADDERALL) 30 MG tablet Take 30 mg by mouth 3 (three) times daily.  08/26/16  Yes [provider]  diclofenac sodium (VOLTAREN) 1 % GEL Apply 2-4 g topically 2 (two) times daily as needed. To knees 05/09/18  Yes Lada, Satira Anis, MD  famotidine (PEPCID) 40 MG tablet Take 1 tablet (40 mg total) by mouth 4 (four) times daily. 11/09/18  Yes Gatha Mayer, MD  fluticasone (FLONASE) 50 MCG/ACT nasal spray Place 2 sprays into both nostrils daily. 03/19/19  Yes Alene Mires, Sahar M, PA-C  glucose blood ([employer] TEST STRIPS) test strip  06/30/15  Yes [provider]  levothyroxine (SYNTHROID, LEVOTHROID) 125 MCG tablet Take 1 tablet (125 mcg total) by mouth daily before breakfast. 01/17/19  Yes Lada, Satira Anis, MD  magnesium oxide (MAG-OX) 400 MG tablet Take 1 tablet (400 mg total) by mouth daily. 06/28/18  Yes Dunn, Ryan M, PA-C  montelukast (SINGULAIR) 10 MG tablet Take 1 tablet (10 mg total) by mouth at bedtime. Patient taking differently: Take 10 mg by mouth at bedtime as needed.  08/02/18  Yes Lada, Satira Anis, MD  tiZANidine (ZANAFLEX) 4 MG tablet Take 4 mg by mouth 3 (three) times daily.  04/19/17  Yes [provider]  torsemide (DEMADEX) 100 MG tablet Take 100 mg by mouth daily.   Yes [provider]  zolpidem (AMBIEN) 10 MG tablet Take 10 mg by mouth at bedtime.   Yes [provider]  quinapril (ACCUPRIL) 20 MG tablet Take 1 tablet (20 mg total) by mouth 2 (two) times a day. 04/26/19   Theora Gianotti, NP    Review of Systems    She reported chest pain, palpitations, dyspnea / SOB, LEE/ upper extremity edema, intermittent nausea with palpitations, and fatigue as above.   No pnd, orthopnea, n, v, dizziness, syncope, edema, weight gain, or early satiety.   All other systems reviewed and are otherwise negative except as noted above.  Physical Exam      VS:  BP 132/80 (BP Location: Left Arm, Patient Position: Sitting, Cuff Size: Normal)    Pulse 69    Temp (!) 97.2 F (36.2 C)    Ht 5\' 5"  (1.651 m)  Wt 184 lb 8 oz (83.7 kg)    SpO2 99%    BMI 30.70 kg/m  , BMI Body mass index is 30.7 kg/m. GEN: Well nourished, well developed, in no acute distress. HEENT: normal. Neck: Supple, no JVD, carotid bruits, or masses. Cardiac: RRR, no murmur rubs, or gallops. No clubbing, cyanosis, edema.  Radials/DP/PT 1+ and equal bilaterally.  Respiratory:  Respirations regular and unlabored, clear to auscultation bilaterally. GI: Soft, nontender, nondistended, BS + x 4. MS: nonpitting LEE with R>L, which is usual for her. Skin: warm and dry, no rash. Neuro:  Strength and sensation are intact. Psych: Normal affect.  Accessory Clinical Findings    ECG personally reviewed by me today -normal sinus rhythm, 69 bpm- no acute changes.  Assessment & Plan    Atypical chest pain without history of CAD - Intermittent chest pain over the last 3 to 4 months occurring at both rest and with exertion.  Chest pain is atypical in nature and without clearly identified triggers with 2017 and 2019 stress testing without evidence of ischemia and echo showing normal EF. She did report chest pain during today's office visit and case was thus discussed in detail with Ignacia Bayley, NP. After a long discussion of possible further workup for the patient at this time, including further workup in the ED, the patient indicated her desire to obtain a cardiac CT over going to the ED or getting a stress test.  She has been instructed to call the office if she has any additional concerning symptoms prior to her cardiac CT. She does not have a contrast allergy and will be scheduled for a BMET in preparation for this scan.  Essential hypertension - Negative work-up for secondary hypertension in the past, including renal artery stenosis.  Restarted Quinapril 20 mg daily with discontinuation  of Bystolic due to reported upper extremity edema.  Noted that her blood pressure in the morning and after taking her AM Quinapril was significantly better than that of her blood pressure in the evening with SBP 130s in AM and 150s in PM. Will increase her Quinapril to 20mg  twice daily (for 40mg  total daily) for more optimal BP support and throughout the day. Obtain BMET today to check renal function and electrolytes, as well as in 1 week given increased dose of ACEi in the setting of existing diuresis with torsemide 100mg  daily.   Symptomatic PVCs - Most recent 07/2018 ZIO monitor as above with 5 short runs of SVT, occasional PVCs, burden of 2.6%.  Echo with normal EF and stress testing without ischemia. Continues to report worsening symptoms with intolerances to most trials of medical therapy. Restarted Quinapril 20 mg since her last visit and d/t swelling with Bystolic.  Will increase dose of Quinapril to 20mg  twice daily (40mg  total). Will also obtain a BMET today to check electrolytes and ensure they are at goal and not contributing to her worsening symptoms.   Hyperlipidemia --Discontinued statin therapy ~ 1 year ago on her own. Check lipid panel.   She reported an intolerance to Zetia in the past with swelling.  Disposition: Obtain BMET now   BMET in 1 week Cardiac CT Follow-up 3 months in [place] office  Arvil Chaco, PA-C 04/26/2019, 1:44 PM

## 2019-04-26 NOTE — Patient Instructions (Signed)
Medication Instructions:  INCREASE Quinapril Take 1 tablet (20 mg total) by mouth 2 (two) times a day If you need a refill on your cardiac medications before your next appointment, please call your pharmacy.   Lab work: Your physician recommends that you have lab work today(Lipid, BMET) AND Your physician recommends that you return for lab work in: 1-2 weeks at the medical mall. You will need to be fasting. (BMET) No appt is needed. Hours are M-F 7AM- 6 PM.  If you have labs (blood work) drawn today and your tests are completely normal, you will receive your results only by: Marland Kitchen MyChart Message (if you have MyChart) OR . A paper copy in the mail If you have any lab test that is abnormal or we need to change your treatment, we will call you to review the results.  Testing/Procedures: 1- Please arrive _____ Location _____ Time (30-45 minutes prior to test start time)   Proceed to the [facility] Radiology Department (First Floor).  Please follow these instructions carefully (unless otherwise directed):  On the Night Before the Test: . Be sure to Drink plenty of water. . Do not consume any caffeinated/decaffeinated beverages or chocolate 12 hours prior to your test. . Hold Adderall . Do not take any antihistamines 12 hours prior to your test.   On the Day of the Test: . Drink plenty of water. Do not drink any water within one hour of the test. . Do not eat any food 4 hours prior to the test. . You may take your regular medications prior to the test.    Metoprolol contraindicated for asthma.      After the Test: . Drink plenty of water. . After receiving IV contrast, you may experience a mild flushed feeling. This is normal. . On occasion, you may experience a mild rash up to 24 hours after the test. This is not dangerous. If this occurs, you can take Benadryl 25 mg and increase your fluid intake. . If you experience trouble breathing, this can be serious. If it is severe call 911  IMMEDIATELY. If it is mild, please call our office. . If you take any of these medications: Glipizide/Metformin, Avandament, Glucavance, please do not take 48 hours after completing test.    Follow-Up: At [name], you and your health needs are our priority.  As part of our continuing mission to provide you with exceptional heart care, we have created designated Provider Care Teams.  These Care Teams include your primary Cardiologist (physician) and Advanced Practice Providers (APPs -  Physician Assistants and Nurse Practitioners) who all work together to provide you with the care you need, when you need it. You will need a follow up appointment in 3 months.  Please establish primary cardiologist.

## 2019-04-27 ENCOUNTER — Other Ambulatory Visit: Payer: Self-pay | Admitting: *Deleted

## 2019-04-27 DIAGNOSIS — E785 Hyperlipidemia, unspecified: Secondary | ICD-10-CM

## 2019-04-27 LAB — BASIC METABOLIC PANEL
BUN/Creatinine Ratio: 25 — ABNORMAL HIGH (ref 9–23)
BUN: 22 mg/dL (ref 6–24)
CO2: 25 mmol/L (ref 20–29)
Calcium: 10.7 mg/dL — ABNORMAL HIGH (ref 8.7–10.2)
Chloride: 100 mmol/L (ref 96–106)
Creatinine, Ser: 0.89 mg/dL (ref 0.57–1.00)
GFR calc Af Amer: 86 mL/min/{1.73_m2} (ref >59)
GFR calc non Af Amer: 74 mL/min/{1.73_m2} (ref >59)
Glucose: 98 mg/dL (ref 65–99)
Potassium: 4.4 mmol/L (ref 3.5–5.2)
Sodium: 141 mmol/L (ref 134–144)

## 2019-04-27 LAB — LDL CHOLESTEROL, DIRECT: LDL Direct: 222 mg/dL — ABNORMAL HIGH (ref 0–99)

## 2019-04-30 DIAGNOSIS — J310 Chronic rhinitis: Secondary | ICD-10-CM | POA: Diagnosis not present

## 2019-04-30 DIAGNOSIS — J301 Allergic rhinitis due to pollen: Secondary | ICD-10-CM | POA: Diagnosis not present

## 2019-04-30 DIAGNOSIS — H93299 Other abnormal auditory perceptions, unspecified ear: Secondary | ICD-10-CM | POA: Diagnosis not present

## 2019-05-03 ENCOUNTER — Telehealth: Payer: Self-pay

## 2019-05-03 NOTE — Telephone Encounter (Signed)
Please arrive at the [facility] main entrance of [facility] 30-45 minutes prior to test start time.  [facility] [address] [place],  [address] [phone]  Proceed to the [facility] Radiology Department (First Floor).  Please follow these instructions carefully (unless otherwise directed):  On the Night Before the Test:  Be sure to Drink plenty of water.  Do not consume any caffeinated/decaffeinated beverages or chocolate 12 hours prior to your test.  Hold Adderall  Do not take any antihistamines 12 hours prior to your test.   On the Day of the Test:  Drink plenty of water. Do not drink any water within one hour of the test.  Do not eat any food 4 hours prior to the test.  You may take your regular medications prior to the test.    Metoprolol contraindicated for asthma.  After the Test: . Drink plenty of water. . After receiving IV contrast, you may experience a mild flushed feeling. This is normal. . On occasion, you may experience a mild rash up to 24 hours after the test. This is not dangerous. If this occurs, you can take Benadryl 25 mg and increase your fluid intake. . If you experience trouble breathing, this can be serious. If it is severe call 911 IMMEDIATELY. If it is mild, please call our office. . If you take any of these medications: Glipizide/Metformin, Avandament, Glucavance, please do not take 48 hours after completing test.

## 2019-05-08 ENCOUNTER — Encounter: Payer: Self-pay | Admitting: Family Medicine

## 2019-05-15 ENCOUNTER — Telehealth (HOSPITAL_COMMUNITY): Payer: Self-pay | Admitting: Emergency Medicine

## 2019-05-15 NOTE — Telephone Encounter (Signed)
Left message on voicemail with name and callback number Veronique Warga RN Navigator Cardiac Imaging [facility] [phone] Office [phone] Cell  

## 2019-05-16 ENCOUNTER — Ambulatory Visit (HOSPITAL_COMMUNITY)
Admission: RE | Admit: 2019-05-16 | Discharge: 2019-05-16 | Disposition: A | Discharge status: Home / Self Care | Payer: Medicare Other | Source: Ambulatory Visit | Attending: Nurse Practitioner | Admitting: Nurse Practitioner

## 2019-05-16 ENCOUNTER — Encounter (HOSPITAL_COMMUNITY): Payer: Self-pay

## 2019-05-16 ENCOUNTER — Other Ambulatory Visit: Payer: Self-pay

## 2019-05-16 ENCOUNTER — Encounter: Payer: Medicare Other | Admitting: *Deleted

## 2019-05-16 DIAGNOSIS — Z006 Encounter for examination for normal comparison and control in clinical research program: Secondary | ICD-10-CM

## 2019-05-16 DIAGNOSIS — R079 Chest pain, unspecified: Secondary | ICD-10-CM | POA: Diagnosis not present

## 2019-05-16 MED ORDER — DILTIAZEM HCL 25 MG/5ML IV SOLN
INTRAVENOUS | Status: AC
  Filled 2019-05-16: qty 5

## 2019-05-16 MED ORDER — DILTIAZEM HCL 25 MG/5ML IV SOLN
5.0000 mg | Freq: Once | INTRAVENOUS | Status: AC
  Administered 2019-05-16: 5 mg via INTRAVENOUS
  Filled 2019-05-16: qty 5

## 2019-05-16 MED ORDER — IVABRADINE HCL 5 MG PO TABS
5.0000 mg | ORAL_TABLET | ORAL | Status: AC
  Administered 2019-05-16: 5 mg via ORAL
  Filled 2019-05-16: qty 1

## 2019-05-16 MED ORDER — NITROGLYCERIN 0.4 MG SL SUBL
SUBLINGUAL_TABLET | SUBLINGUAL | Status: AC
  Administered 2019-05-16: 0.8 mg
  Filled 2019-05-16: qty 2

## 2019-05-16 MED ORDER — ALPRAZOLAM 0.5 MG PO TABS
1.0000 mg | ORAL_TABLET | Freq: Once | ORAL | Status: AC
  Administered 2019-05-16: 1 mg via ORAL
  Filled 2019-05-16: qty 1

## 2019-05-16 MED ORDER — IOHEXOL 350 MG/ML SOLN
80.0000 mL | Freq: Once | INTRAVENOUS | Status: AC | PRN
  Administered 2019-05-16: 80 mL via INTRAVENOUS

## 2019-05-16 NOTE — Research (Signed)
Subject Name: Laura [name]  Subject met inclusion and exclusion criteria.  The informed consent form, study requirements and expectations were reviewed with the subject and questions and concerns were addressed prior to the signing of the consent form.  The subject verbalized understanding of the trial requirements.  The subject agreed to participate in the CADFEM G4 trial and signed the informed consent at 1230 on07/15/20.  The informed consent was obtained prior to performance of any protocol-specific procedures for the subject.  A copy of the signed informed consent was given to the subject and a copy was placed in the subject's medical record.   [name]

## 2019-05-16 NOTE — Progress Notes (Signed)
CT scan completed. Tolerated well. D/C home walking, awake and alert. In no distress. 

## 2019-05-18 ENCOUNTER — Telehealth: Payer: Self-pay | Admitting: Cardiovascular Disease

## 2019-05-18 DIAGNOSIS — I493 Ventricular premature depolarization: Secondary | ICD-10-CM

## 2019-05-18 NOTE — Telephone Encounter (Signed)
It seems that we have tried diltiazem, metoprolol and Bystolic.  Did she tolerate any of these medications? Beta blockers are not know to cause swelling.

## 2019-05-18 NOTE — Telephone Encounter (Signed)
I called and spoke with the patient regarding her Cardiac CT. She stated, "well what are we going to do about these ongoing palpitations."  Patient states she was having PVC's frequently during her CT scan, they didn't think they could get the pictures they needed to start with. She is having ongoing palpitations almost all day every day.   She has failed beta blockers as they do not work for her and cause her to swell. She is currently on quinipril 20 mg BID- this was increased at her office visit with [place], [place] on 04/26/19.  She states the increase is not helping. She is not drinking caffeine/ using stimulants. She cannot pinpoint a trigger for what makes these worse. She feels the heat is not making a difference right now.   Last monitor worn in 07/2018. Last echo done 07/2018.   Patient is to follow up in [place], but has not established there yet.  Will forward to Dr. Fletcher Anon to review/ further recommendations regarding ongoing palpitations.

## 2019-05-18 NOTE — Telephone Encounter (Signed)
She said she did not tolerate any of the beta blockers she was on. She said she has underlying swelling that got worse with beta blocker therapy, which I thought was unusual. I asked her if the beta blockers helped in spite of her swelling and she said she really didn't see that they helped.   She said she feels like her swelling is starting to get a little worse with quinipril as well.   I'm not sure how many PVC's she's actually having.  Her last monitor a year ago said "occasional." Not sure if runs of SVT or what.

## 2019-05-22 ENCOUNTER — Other Ambulatory Visit: Payer: Self-pay | Admitting: Family Medicine

## 2019-05-22 ENCOUNTER — Other Ambulatory Visit: Payer: Self-pay | Admitting: Nurse Practitioner

## 2019-05-23 NOTE — Telephone Encounter (Signed)
Spoke with the pt and made her aware of Dr. Tyrell Antonio recommendation. Pt is agreeable with wearing the heart monitor. Pt rqst that the monitor be mailed to her home. Verified the pt mailing address. Adv the pt that if she has nor received the monitor in the mail in 1 week should should call our office to give an update. Pt verbalized understanding.

## 2019-05-23 NOTE — Telephone Encounter (Signed)
zio-monitor registered- to be mailed to the pt home.

## 2019-05-23 NOTE — Telephone Encounter (Signed)
Order a 3 day Zio monitor to see how much PVCs she is having.

## 2019-05-24 ENCOUNTER — Telehealth: Payer: Medicare Other | Admitting: Physician Assistant

## 2019-05-30 ENCOUNTER — Other Ambulatory Visit: Payer: Self-pay | Admitting: Family Medicine

## 2019-06-05 ENCOUNTER — Other Ambulatory Visit: Payer: Self-pay | Admitting: Family Medicine

## 2019-06-07 ENCOUNTER — Ambulatory Visit (INDEPENDENT_AMBULATORY_CARE_PROVIDER_SITE_OTHER): Payer: Medicare Other

## 2019-06-07 DIAGNOSIS — I493 Ventricular premature depolarization: Secondary | ICD-10-CM

## 2019-06-15 ENCOUNTER — Other Ambulatory Visit: Payer: Self-pay | Admitting: Family Medicine

## 2019-06-18 DIAGNOSIS — R002 Palpitations: Secondary | ICD-10-CM | POA: Diagnosis not present

## 2019-06-18 NOTE — Telephone Encounter (Signed)
Pt needs to schedule follow up with labs for refills. Has not had TSH in a year.

## 2019-06-19 ENCOUNTER — Other Ambulatory Visit: Payer: Self-pay

## 2019-06-20 ENCOUNTER — Other Ambulatory Visit: Payer: Self-pay

## 2019-06-20 ENCOUNTER — Encounter: Payer: Self-pay | Admitting: Family Medicine

## 2019-06-20 ENCOUNTER — Ambulatory Visit (INDEPENDENT_AMBULATORY_CARE_PROVIDER_SITE_OTHER): Payer: Medicare Other | Admitting: Family Medicine

## 2019-06-20 VITALS — BP 124/82 | HR 94 | Temp 97.9°F | Resp 16 | Ht 64.0 in | Wt 186.1 lb

## 2019-06-20 DIAGNOSIS — K760 Fatty (change of) liver, not elsewhere classified: Secondary | ICD-10-CM

## 2019-06-20 DIAGNOSIS — E039 Hypothyroidism, unspecified: Secondary | ICD-10-CM | POA: Diagnosis not present

## 2019-06-20 DIAGNOSIS — N182 Chronic kidney disease, stage 2 (mild): Secondary | ICD-10-CM

## 2019-06-20 DIAGNOSIS — I10 Essential (primary) hypertension: Secondary | ICD-10-CM | POA: Diagnosis not present

## 2019-06-20 DIAGNOSIS — B001 Herpesviral vesicular dermatitis: Secondary | ICD-10-CM

## 2019-06-20 DIAGNOSIS — E559 Vitamin D deficiency, unspecified: Secondary | ICD-10-CM | POA: Diagnosis not present

## 2019-06-20 DIAGNOSIS — Z5181 Encounter for therapeutic drug level monitoring: Secondary | ICD-10-CM

## 2019-06-20 DIAGNOSIS — E782 Mixed hyperlipidemia: Secondary | ICD-10-CM

## 2019-06-20 DIAGNOSIS — K2270 Barrett's esophagus without dysplasia: Secondary | ICD-10-CM

## 2019-06-20 DIAGNOSIS — R7303 Prediabetes: Secondary | ICD-10-CM | POA: Diagnosis not present

## 2019-06-20 DIAGNOSIS — E669 Obesity, unspecified: Secondary | ICD-10-CM

## 2019-06-20 DIAGNOSIS — R252 Cramp and spasm: Secondary | ICD-10-CM

## 2019-06-20 DIAGNOSIS — M797 Fibromyalgia: Secondary | ICD-10-CM

## 2019-06-20 DIAGNOSIS — J4530 Mild persistent asthma, uncomplicated: Secondary | ICD-10-CM

## 2019-06-20 DIAGNOSIS — E876 Hypokalemia: Secondary | ICD-10-CM

## 2019-06-20 MED ORDER — PANTOPRAZOLE SODIUM 40 MG PO TBEC: 40.0000 mg | DELAYED_RELEASE_TABLET | Freq: Every day | ORAL | 3 refills | Status: DC

## 2019-06-20 MED ORDER — VALACYCLOVIR HCL 1 G PO TABS: 1000.0000 mg | ORAL_TABLET | Freq: Two times a day (BID) | ORAL | 2 refills | Status: AC

## 2019-06-20 MED ORDER — BUDESONIDE-FORMOTEROL FUMARATE 80-4.5 MCG/ACT IN AERO: 2.0000 | INHALATION_SPRAY | Freq: Two times a day (BID) | RESPIRATORY_TRACT | 3 refills | Status: DC

## 2019-06-20 MED ORDER — ALBUTEROL SULFATE HFA 108 (90 BASE) MCG/ACT IN AERS: INHALATION_SPRAY | RESPIRATORY_TRACT | 3 refills | Status: DC

## 2019-06-20 NOTE — Assessment & Plan Note (Signed)
Well controlled on meds, good med compliance No CP, DOE Has chronic swelling and palpitations, no change Check renal function and electrolytes

## 2019-06-20 NOTE — Assessment & Plan Note (Signed)
Managed by specialist- psych? Gabapentin dose - she's only taking 150 mg at night, having SE of forgetfulness, "foggy"

## 2019-06-20 NOTE — Progress Notes (Signed)
Name: JAIMEY FRANCHINI   MRN: [id number]    DOB: 03-04-1966   Date:06/20/2019       Progress Note  Chief Complaint  Patient presents with   Hypothyroidism    medication refills   Mouth Lesions     Subjective:   JAEMARIE HOCHBERG is a 53 y.o. female, presents to clinic with CC of routine follow-up she does feel like her thyroid is low, is overdue for labs and is here to request med refills.   She has a very long extensive problem list and is managed by many specialists including cardiology, nephrology, GI, psychiatry.  She reports she has a lot of med intolerances and hypersensitivity.  A lot of medications make her swell and she reports long history of chronic pain, is disabled, becomes tearful when she talks about her her health and limitations.  She was a patient of Dr.Lada's who has left the practice and retired.  She is here to get everything sorted and updated and she will be looking for a new PCP closer to where she lives which is about an hour away.   HTN - Managing well with ACEI, checks at home, she reports it is very labile -  140/101 high doesn't feel good, and most of the time 120/70-80.  No current SE on quinapril.  Also on demadex. She has a lot of meds that make her swell. On demadex daily, hx of kidney injury/failure, has a lot of swelling, hx of hypokalemia Sees nephrology No HA, visual disturbances, chest pain  HLD - going to specialist for it - cardiology, she states that she is intolerant of statins.  She is fasting today.  She states she has been working on very low calorie diet, only eating 1000 to 1100 cal a day.  Long list of chronic diseases including anxiety and depression, see PHQ below, fibromyalgia- Managing specialist - Rupinder Kurr - manages her psych/pain/anxiety meds -she is on benzodiazepines, Adderall, Ambien and gabapentin.  She reports she is taking much lower dose of gabapentin than what is on her chart she is only taking 150 mg at night, states she  cannot tolerate any SSRIs or SNRIs, cannot tolerate Lyrica.  She still has very poor sleep, daily chronic pain and fatigue  Hypothyroid- Patient reports several decade history of hypothyroid.  She would like med refill, but also is gaining weight and is fatigued with diffuse bloating and swelling (though long hx of swelling). Wt Readings from Last 5 Encounters:  06/20/19 186 lb 1.6 oz (84.4 kg)  04/26/19 184 lb 8 oz (83.7 kg)  12/08/18 180 lb (81.6 kg)  12/05/18 185 lb (83.9 kg)  11/03/18 181 lb (82.1 kg)   Lab Results  Component Value Date   TSH 1.600 06/27/2018   Obesity- Eating 1000-1100 calories, still gaining weight, cannot exercise due to chronic pain in many joints and fibromyalgia  Prediabetes - watching sugars, checking frequently at home - 120's fasting, last A1C 5.8, had been on metformin in the past tolerated well, not currently on anything.  Cold Sore-  Cold sore developed 4 days ago, trying abreva but no improvement.  Wants oral meds to treat.  Hx of recurrence.   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   Recurrent UTI 05/10/2017   Status post  hysterectomy 03/08/2017   Barrett's esophagus 11/02/2016   Difficulty with speech 10/18/2016   Muscle spasm 10/18/2016   Memory impairment 10/18/2016   Chronic pain syndrome 10/12/2016   Chronic low back pain (Location of Tertiary source of pain) (Bilateral) (L>R) 10/12/2016   Lumbar facet joint syndrome 10/12/2016   Chronic sacroiliac joint pain 10/12/2016   Chronic shoulder pain (Location of Primary Source of Pain) (Bilateral) (R>L) 10/12/2016   Chronic neck pain (Location of Secondary source of pain) (Bilateral) (R>L) 10/12/2016   Chronic upper back pain (Bilateral) (L>R) 10/12/2016   Chronic hand pain  (Bilateral) (R>L) 10/12/2016   Chronic hand pain (Right) 10/12/2016   Chronic hand pain (Left) 10/12/2016   Chronic knee pain (Right) 10/12/2016   CKD (chronic kidney disease) 10/11/2016   Long term prescription opiate use 10/11/2016   Opiate use 10/11/2016   Vitamin B12 deficiency 07/26/2016   Anxiety 07/06/2016   Depression 07/06/2016   Raynaud disease 05/06/2016   Fibromyalgia 02/13/2016   Prediabetes 12/23/2015   Heart palpitations 12/16/2015   Heart murmur 12/16/2015   Hyperlipidemia, mixed 11/19/2015   Gastroesophageal reflux disease 06/13/2015   Osteoarthrosis, generalized, multiple joints 06/06/2015   Vaginal enterocele    Urinary retention with incomplete bladder emptying    Obesity, Class I, BMI 30.0-34.9 (see actual BMI)    Rectocele    Cystocele    Hypothyroidism, unspecified 12/03/2014   Essential hypertension 12/03/2014   Vitamin D deficiency 12/03/2014   Asthma in adult, mild persistent, uncomplicated 93/23/5573   Insomnia 08/31/2013    Past Surgical History:  Procedure Laterality Date   36 HOUR [place] STUDY N/A 10/11/2018   Procedure: 24 HOUR PH STUDY;  Surgeon: Mauri Pole, MD;  Location: [facility];  Service: Endoscopy;  Laterality: N/A;   ABDOMINAL HYSTERECTOMY     ANKLE SURGERY     ran over by mother in car by [place]  2015   posterior and enterocele ligation   CYSTOSCOPY  04/11/2017   Procedure: CYSTOSCOPY;  Surgeon: Defrancesco, Alanda Slim, MD;  Location: [facility];  Service: Gynecology;;   ESOPHAGEAL MANOMETRY N/A 10/11/2018   Procedure: ESOPHAGEAL MANOMETRY (EM);  Surgeon: Mauri Pole, MD;  Location: [facility];  Service: Endoscopy;  Laterality: N/A;   LAPAROSCOPIC SALPINGO OOPHERECTOMY Left 04/11/2017   Procedure: LAPAROSCOPIC LEFT SALPINGO OOPHORECTOMY;  Surgeon: Brayton Mars, MD;  Location: [facility];  Service: Gynecology;  Laterality: Left;     LITHOTRIPSY     OOPHORECTOMY     PARTIAL HYSTERECTOMY     [place] IMPEDANCE STUDY N/A 10/11/2018   Procedure: [place] IMPEDANCE STUDY;  Surgeon: Mauri Pole, MD;  Location: [facility];  Service: Endoscopy;  Laterality: N/A;   thumb surgery     UPPER GASTROINTESTINAL ENDOSCOPY      Family History  Problem Relation Age of Onset   Stroke Father    Diabetes Father    Breast cancer Mother 3   Diverticulitis Mother    Esophageal cancer Maternal Grandfather    Colon cancer Paternal Grandmother    Ovarian cancer Neg Hx    Stomach cancer Neg Hx     Social History   Socioeconomic History   Marital status: Legally Separated    Spouse name: Not on file   Number of children: 2   Years of education: Not on file   Highest education level: Some college, no degree  Occupational History   Occupation: diabled  Social Designer, fashion/clothing strain: Very hard   Food insecurity    Worry: Often true    Inability: Often true   Transportation needs    Medical: No    Non-medical: No  Tobacco Use   Smoking status: Former Smoker    Quit date: 04/08/1995    Years since quitting: 24.2   Smokeless tobacco: Never Used  Substance and Sexual Activity   Alcohol use: Not Currently    Alcohol/week: 1.0 standard drinks    Types: 1 Glasses of wine per week    Comment: rare; once every 6 months   Drug use: No   Sexual activity: Yes    Partners: Male    Birth control/protection: Surgical  Lifestyle   Physical activity    Days per week: 0 days    Minutes per session: 0 min   Stress: Very much  Relationships   Social connections    Talks on phone: More than three times a week    Gets together: Once a week    Attends religious service: More than 4 times per year    Active member of club or organization: No    Attends meetings of clubs or organizations: Never    Relationship status: Separated   Intimate partner violence    Fear of current or ex partner: No     Emotionally abused: No    Physically abused: No    Forced sexual activity: No  Other Topics Concern   Not on file  Social History Narrative   Separated - 1 son and 1 daughter   Disabled    1 caffeine/day   Past smoker   No EtOH, drugs      08/02/2018        Current Outpatient Medications:    albuterol (VENTOLIN HFA) 108 (90 Base) MCG/ACT inhaler, INHALE 2 PUFFS INTO THE LUNGS EVERY 4 HOURS AS NEEDED FOR WHEEZING OR SHORTNESS OF BREATH, Disp: 42.5 g, Rfl: 0   ALPRAZolam (XANAX) 1 MG tablet, Take 1 mg by mouth 4 (four) times daily. , Disp: , Rfl:    amphetamine-dextroamphetamine (ADDERALL) 30 MG tablet, Take 30 mg by mouth 3 (three) times daily. , Disp: , Rfl: 0   diclofenac sodium (VOLTAREN) 1 % GEL, Apply 2-4 g topically 2 (two) times daily as needed. To knees, Disp: 300 g, Rfl: 1   fluticasone (FLONASE) 50 MCG/ACT nasal spray, Place 2 sprays into both nostrils daily., Disp: 16 g, Rfl: 0   gabapentin (NEURONTIN) 600 MG tablet, Take 600 mg by mouth 3 (three) times daily., Disp: , Rfl:    glucose blood ([employer] TEST STRIPS) test strip, , Disp: , Rfl:    levothyroxine (SYNTHROID, LEVOTHROID) 125 MCG tablet, Take 1 tablet (125 mcg total) by mouth daily before breakfast., Disp: 90 tablet, Rfl: 1   magnesium oxide (MAG-OX) 400 MG tablet, Take 1 tablet (400 mg total) by mouth daily., Disp: 90 tablet, Rfl: 3   montelukast (SINGULAIR) 10 MG tablet, Take 1 tablet (10 mg total) by mouth at bedtime. (Patient taking differently: Take 10 mg by mouth at bedtime as needed. ), Disp: 90 tablet, Rfl: 3   pantoprazole (PROTONIX) 40 MG tablet, , Disp: , Rfl:    quinapril (ACCUPRIL) 20 MG tablet, Take 1 tablet (20 mg total) by mouth 2 (two) times a day., Disp: 180 tablet, Rfl: 3   tiZANidine (ZANAFLEX) 4 MG tablet, Take 4 mg by mouth 3 (three) times daily. , Disp: , Rfl: 0  torsemide (DEMADEX) 100 MG tablet, Take 100 mg by mouth daily., Disp: , Rfl:    zolpidem (AMBIEN) 10 MG tablet,  Take 10 mg by mouth at bedtime., Disp: , Rfl:    famotidine (PEPCID) 40 MG tablet, Take 1 tablet (40 mg total) by mouth 4 (four) times daily. (Patient not taking: Reported on 06/20/2019), Disp: 360 tablet, Rfl: 3  Allergies  Allergen Reactions   Meperidine Hives   Shellfish Allergy Shortness Of Breath and Swelling   Diltiazem Swelling   Acebutolol Swelling   Amlodipine     Swelling    Codeine Hives and Nausea And Vomiting   Cymbalta [Duloxetine Hcl] Other (See Comments)    Made pt feel crazy   Dexilant [Dexlansoprazole] Other (See Comments)    Abdominal pain   Hydrocodone Other (See Comments)    Keeps patient awake.   Losartan     Swelling    Metoprolol Swelling   Mirtazapine Swelling   Omeprazole     Abdominal pain   Oxycodone Itching   Sectral [Acebutolol Hcl] Swelling   Tramadol     Unable to sleep, makes her itch    I personally reviewed active problem list, medication list, allergies, family history, social history, notes from last several encounters, lab results, endoscopy procedure notes, imaging with the patient/caregiver today.  Review of Systems  Constitutional: Negative.   HENT: Negative.   Eyes: Negative.   Respiratory: Negative.   Cardiovascular: Negative.   Gastrointestinal: Negative.   Endocrine: Negative.   Genitourinary: Negative.   Musculoskeletal: Negative.   Skin: Negative.   Allergic/Immunologic: Negative.   Neurological: Negative.   Hematological: Negative.   Psychiatric/Behavioral: Negative.   All other systems reviewed and are negative.    Objective:    Vitals:   06/20/19 0757  BP: 124/82  Pulse: 94  Resp: 16  Temp: 97.9 F (36.6 C)  TempSrc: Oral  SpO2: 94%  Weight: 186 lb 1.6 oz (84.4 kg)  Height: 5\' 4"  (1.626 m)    Body mass index is 31.94 kg/m.  Physical Exam Vitals signs and nursing note reviewed.  Constitutional:      General: She is not in acute distress.    Appearance: Normal appearance. She is  well-developed. She is not ill-appearing, toxic-appearing or diaphoretic.  HENT:     Head: Normocephalic and atraumatic.     Right Ear: External ear normal.     Left Ear: External ear normal.     Mouth/Throat:     Mouth: Mucous membranes are moist.     Pharynx: Oropharynx is clear.  Eyes:     General:        Right eye: No discharge.        Left eye: No discharge.     Conjunctiva/sclera: Conjunctivae normal.  Neck:     Musculoskeletal: Normal range of motion.     Trachea: No tracheal deviation.  Cardiovascular:     Rate and Rhythm: Normal rate and regular rhythm.     Pulses: Normal pulses.     Heart sounds: Normal heart sounds. No murmur. No friction rub. No gallop.   Pulmonary:     Effort: Pulmonary effort is normal. No respiratory distress.     Breath sounds: Normal breath sounds. No stridor. No wheezing, rhonchi or rales.  Abdominal:     General: Bowel sounds are normal. There is no distension.     Palpations: Abdomen is soft.     Tenderness: There is no abdominal tenderness. There is no guarding  or rebound.  Musculoskeletal: Normal range of motion.     Right lower leg: Edema (non pitting ankle and pedal edema, slightly larger than left, with scars - chronically R>L) present.     Left lower leg: No edema.  Skin:    General: Skin is warm and dry.     Capillary Refill: Capillary refill takes less than 2 seconds.     Coloration: Skin is not jaundiced or pale.     Findings: No bruising or rash.  Neurological:     Mental Status: She is alert.     Motor: No abnormal muscle tone.     Coordination: Coordination normal.  Psychiatric:        Mood and Affect: Mood normal.        Behavior: Behavior normal.        Thought Content: Thought content normal.        Judgment: Judgment normal.      Recent Results (from the past 2160 hour(s))  LDL cholesterol, direct     Status: Abnormal   Collection Time: 04/26/19  1:38 PM  Result Value Ref Range   LDL Direct 222 (H) 0 - 99 mg/dL    Comment: Comment     Comment: Possible Familial Hypercholesterolemia. FH should be suspected when fasting LDL cholesterol is above 189 mg/dL or non-HDL cholesterol is above 219 mg/dL. A family history of high cholesterol and heart disease in 1st degree relatives should be collected. J Clin Lipidol 6578;4:[id number]   Basic metabolic panel     Status: Abnormal   Collection Time: 04/26/19  1:38 PM  Result Value Ref Range   Glucose 98 65 - 99 mg/dL   BUN 22 6 - 24 mg/dL   Creatinine, Ser 0.89 0.57 - 1.00 mg/dL   GFR calc non Af Amer 74 >59 mL/min/1.73   GFR calc Af Amer 86 >59 mL/min/1.73   BUN/Creatinine Ratio 25 (H) 9 - 23   Sodium 141 134 - 144 mmol/L   Potassium 4.4 3.5 - 5.2 mmol/L   Chloride 100 96 - 106 mmol/L   CO2 25 20 - 29 mmol/L   Calcium 10.7 (H) 8.7 - 10.2 mg/dL    Diabetic Foot Exam: Diabetic Foot Exam - Simple   No data filed       PHQ2/9: Depression screen [facility] 2/9 06/20/2019 12/05/2018 10/13/2018 07/10/2018 05/12/2018  Decreased Interest 2 0 0 3 0  Down, Depressed, Hopeless 1 0 1 3 0  PHQ - 2 Score 3 0 1 6 0  Altered sleeping 3 0 3 3 1   Tired, decreased energy 2 0 3 3 1   Change in appetite 2 0 0 1 0  Feeling bad or failure about yourself  1 0 1 0 1  Trouble concentrating 1 0 0 1 1  Moving slowly or fidgety/restless 1 0 0 3 1  Suicidal thoughts 0 0 0 0 0  PHQ-9 Score 13 0 8 17 -  Difficult doing work/chores Somewhat difficult Not difficult at all Somewhat difficult Very difficult Not difficult at all  Some recent data might be hidden    phq 9 is positive - Seeing specialists -psychiatry  Fall Risk: Fall Risk  06/20/2019 12/05/2018 10/13/2018 07/10/2018 05/12/2018  Falls in the past year? 1 1 1  Yes Yes  Number falls in past yr: 1 1 1 2  or more 2 or more  Injury with Fall? 1 0 1 Yes Yes  Comment - - - - -  Risk Factor Category  - - - -  High Fall Risk  Risk for fall due to : History of fall(s) - - - -     Assessment & Plan:   Patient is a 53 year old female  here for routine follow-up on multiple chronic conditions also feels fatigued and would like her thyroid levels checked.  Her PCP has left the practice unfortunately, patient will be finding a new PCP closer to where she lives about an hour away  Very complicated medical history several specialist, extensive problem list which I attempted to simplify today, seems she has extensive med history often with very comprehensive and elaborate work-ups.    Problem List Items Addressed This Visit      Cardiovascular and Mediastinum   Essential hypertension    Well controlled on meds, good med compliance No CP, DOE Has chronic swelling and palpitations, no change Check renal function and electrolytes        Respiratory   Asthma in adult, mild persistent, uncomplicated    High use of SABA, undercontrolled asthma Start symbicort BID, swish and spit after con't singulair and avoiding triggers      Relevant Medications   albuterol (VENTOLIN HFA) 108 (90 Base) MCG/ACT inhaler   budesonide-formoterol (SYMBICORT) 80-4.5 MCG/ACT inhaler     Digestive   Barrett's esophagus    Per GI, continue protonix      Relevant Medications   pantoprazole (PROTONIX) 40 MG tablet   Fatty liver   Relevant Orders   COMPLETE METABOLIC PANEL WITH GFR (Completed)   Lipid panel (Completed)     Endocrine   Hypothyroidism, unspecified - Primary    Sx consistent with chemical hypothyroid - recheck TSH but feel increase to 88 mcg is safe and appropriate      Relevant Orders   TSH     Genitourinary   CKD (chronic kidney disease)    Per chart - will recheck labs Does see nephrology      Relevant Orders   COMPLETE METABOLIC PANEL WITH GFR (Completed)     Other   Fibromyalgia (Chronic)    Managed by specialist- psych? Gabapentin dose - she's only taking 150 mg at night, having SE of forgetfulness, "foggy"      Relevant Medications   gabapentin (NEURONTIN) 600 MG tablet   Obesity, Class I, BMI  30.0-34.9 (see actual BMI)    Weight increasing despite low calorie diet Exercise limited by chronic conditions Feels like she's bloated and swelling, rechecking thyroid today before refill in case need of dose change Healthy lifestyle as able      Relevant Orders   Hemoglobin A1c   TSH   Lipid panel (Completed)   Vitamin D deficiency    Off supplements and fatigued, recheck level, last was normal Postmenopausal and estrogen deficiency also due to TAH-BSO, if levels normal at least daily 1000IU supplement vit D, hold calcium until labs due to hx of hypercalcemia      Relevant Orders   VITAMIN D 25 Hydroxy (Vit-D Deficiency, Fractures)   VITAMIN D 25 Hydroxy (Vit-D Deficiency, Fractures)   Hyperlipidemia, mixed    Pt not currently taking statin - has many years in the past of taking lipitor and crestor - reported not taking June (2 months ago), her PCP's last note indicated need for increased statin dose and recheck of labs, but she was lost to f/up FLP today Pt going to cardiac specialist in the next couple weeks to specifically address HLD and statin intolerance      Relevant Orders  COMPLETE METABOLIC PANEL WITH GFR (Completed)   Lipid panel (Completed)   Prediabetes    Last A1C 5.8, pt monitoring CBGs at home, fasting 110-120, repeat A1C today with CMP Discussed multiple causes of DM/pathophysiology, she is very anxious about monitoring sugars and starting meds, overall healthy diet, low calories and avoiding starch/carbs/simples sugars in diet.  Advised I would not start meds for her unless A1C worsens or if concerned for insulin resistance or complication from metabolic syndrome      Relevant Orders   Hemoglobin A1c   Lipid panel (Completed)   Hypercalcemia    Recheck Ca level, longstanding hx of hypercalcemia - pt says today she has never seen endocrine, will recheck Ca, if elevated proceed to w/up for hyperparathyroid - serum Ca, PTH, etc      Relevant Orders    COMPLETE METABOLIC PANEL WITH GFR (Completed)   RESOLVED: Medication monitoring encounter   Relevant Orders   CBC with Differential/Platelet (Completed)   COMPLETE METABOLIC PANEL WITH GFR (Completed)   Hemoglobin A1c   TSH   Lipid panel (Completed)    Other Visit Diagnoses    Muscle cramps       Check mag with other electrolytes in chemistry   Relevant Orders   COMPLETE METABOLIC PANEL WITH GFR (Completed)   Magnesium   Magnesium (Completed)   Hypokalemia       hx of, on diuretics, recheck cmp   Relevant Orders   Magnesium   Recurrent cold sores       current cold sore not improving with Abreva, requesting Valtrex, late use may not help, but refills for future outbreaks   Relevant Medications   valACYclovir (VALTREX) 1000 MG tablet       With hx of frequent hypercalcemia, kidney stones, and sx consistent with symptomatic hypercalcemia, concern for possible primary hyperparathyroid - if confirmed and tx, may improve sx. Saw PTH done in the past, was WNL, will research chart further and see what I can recheck - possibly refer to endocrine?   Return in about 6 months (around 12/21/2019) for routine f/up or CPE if pt wishes to continue care here.   Delsa Grana, PA-C 06/20/19 8:23 AM

## 2019-06-20 NOTE — Assessment & Plan Note (Signed)
Pt not currently taking statin - has many years in the past of taking lipitor and crestor - reported not taking June (2 months ago), her PCP's last note indicated need for increased statin dose and recheck of labs, but she was lost to f/up FLP today Pt going to cardiac specialist in the next couple weeks to specifically address HLD and statin intolerance

## 2019-06-20 NOTE — Assessment & Plan Note (Signed)
Sx consistent with chemical hypothyroid - recheck TSH but feel increase to 88 mcg is safe and appropriate

## 2019-06-20 NOTE — Assessment & Plan Note (Addendum)
Per chart - will recheck labs Does see nephrology

## 2019-06-20 NOTE — Assessment & Plan Note (Signed)
Off supplements and fatigued, recheck level, last was normal Postmenopausal and estrogen deficiency also due to TAH-BSO, if levels normal at least daily 1000IU supplement vit D, hold calcium until labs due to hx of hypercalcemia

## 2019-06-20 NOTE — Assessment & Plan Note (Signed)
Recheck Ca level, longstanding hx of hypercalcemia - pt says today she has never seen endocrine, will recheck Ca, if elevated proceed to w/up for hyperparathyroid - serum Ca, PTH, etc

## 2019-06-20 NOTE — Assessment & Plan Note (Signed)
Last A1C 5.8, pt monitoring CBGs at home, fasting 110-120, repeat A1C today with CMP Discussed multiple causes of DM/pathophysiology, she is very anxious about monitoring sugars and starting meds, overall healthy diet, low calories and avoiding starch/carbs/simples sugars in diet.  Advised I would not start meds for her unless A1C worsens or if concerned for insulin resistance or complication from metabolic syndrome

## 2019-06-20 NOTE — Assessment & Plan Note (Signed)
Per GI, continue protonix

## 2019-06-20 NOTE — Assessment & Plan Note (Signed)
High use of SABA, undercontrolled asthma Start symbicort BID, swish and spit after con't singulair and avoiding triggers

## 2019-06-20 NOTE — Assessment & Plan Note (Signed)
Weight increasing despite low calorie diet Exercise limited by chronic conditions Feels like she's bloated and swelling, rechecking thyroid today before refill in case need of dose change Healthy lifestyle as able

## 2019-06-21 ENCOUNTER — Telehealth: Payer: Self-pay

## 2019-06-21 DIAGNOSIS — E039 Hypothyroidism, unspecified: Secondary | ICD-10-CM

## 2019-06-21 LAB — COMPLETE METABOLIC PANEL WITH GFR
AG Ratio: 1.7 (calc) (ref 1.0–2.5)
ALT: 13 U/L (ref 6–29)
AST: 15 U/L (ref 10–35)
Albumin: 4.5 g/dL (ref 3.6–5.1)
Alkaline phosphatase (APISO): 57 U/L (ref 37–153)
BUN: 24 mg/dL (ref 7–25)
CO2: 31 mmol/L (ref 20–32)
Calcium: 10.3 mg/dL (ref 8.6–10.4)
Chloride: 101 mmol/L (ref 98–110)
Creat: 0.88 mg/dL (ref 0.50–1.05)
GFR, Est African American: 87 mL/min/{1.73_m2} (ref >=60)
GFR, Est Non African American: 75 mL/min/{1.73_m2} (ref >=60)
Globulin: 2.7 g/dL (calc) (ref 1.9–3.7)
Glucose, Bld: 104 mg/dL — ABNORMAL HIGH (ref 65–99)
Potassium: 4.4 mmol/L (ref 3.5–5.3)
Sodium: 140 mmol/L (ref 135–146)
Total Bilirubin: 0.4 mg/dL (ref 0.2–1.2)
Total Protein: 7.2 g/dL (ref 6.1–8.1)

## 2019-06-21 LAB — CBC WITH DIFFERENTIAL/PLATELET
Absolute Monocytes: 359 cells/uL (ref 200–950)
Basophils Absolute: 41 cells/uL (ref 0–200)
Basophils Relative: 0.9 %
Eosinophils Absolute: 147 cells/uL (ref 15–500)
Eosinophils Relative: 3.2 %
HCT: 46.7 % — ABNORMAL HIGH (ref 35.0–45.0)
Hemoglobin: 14.9 g/dL (ref 11.7–15.5)
Lymphs Abs: 1923 cells/uL (ref 850–3900)
MCH: 27.8 pg (ref 27.0–33.0)
MCHC: 31.9 g/dL — ABNORMAL LOW (ref 32.0–36.0)
MCV: 87.1 fL (ref 80.0–100.0)
MPV: 9.1 fL (ref 7.5–12.5)
Monocytes Relative: 7.8 %
Neutro Abs: 2130 cells/uL (ref 1500–7800)
Neutrophils Relative %: 46.3 %
Platelets: 324 10*3/uL (ref 140–400)
RBC: 5.36 10*6/uL — ABNORMAL HIGH (ref 3.80–5.10)
RDW: 13.3 % (ref 11.0–15.0)
Total Lymphocyte: 41.8 %
WBC: 4.6 10*3/uL (ref 3.8–10.8)

## 2019-06-21 LAB — LIPID PANEL
Cholesterol: 306 mg/dL — ABNORMAL HIGH (ref <200)
HDL: 50 mg/dL (ref >=50)
LDL Cholesterol (Calc): 196 mg/dL (calc) — ABNORMAL HIGH
Non-HDL Cholesterol (Calc): 256 mg/dL (calc) — ABNORMAL HIGH (ref <130)
Total CHOL/HDL Ratio: 6.1 (calc) — ABNORMAL HIGH (ref <5.0)
Triglycerides: 356 mg/dL — ABNORMAL HIGH (ref <150)

## 2019-06-21 LAB — VITAMIN D 25 HYDROXY (VIT D DEFICIENCY, FRACTURES): Vit D, 25-Hydroxy: 42 ng/mL (ref 30–100)

## 2019-06-21 LAB — HEMOGLOBIN A1C
Hgb A1c MFr Bld: 6 % of total Hgb — ABNORMAL HIGH (ref <5.7)
Mean Plasma Glucose: 126 (calc)
eAG (mmol/L): 7 (calc)

## 2019-06-21 LAB — MAGNESIUM: Magnesium: 2 mg/dL (ref 1.5–2.5)

## 2019-06-21 LAB — TSH: TSH: 2.2 mIU/L

## 2019-06-21 MED ORDER — LEVOTHYROXINE SODIUM 137 MCG PO CAPS: 137.0000 ug | ORAL_CAPSULE | Freq: Every day | ORAL | 3 refills | Status: DC

## 2019-06-21 NOTE — Addendum Note (Signed)
Addended by: Delsa Grana on: 06/21/2019 01:42 PM   Modules accepted: Orders

## 2019-06-21 NOTE — Telephone Encounter (Signed)
-----   Message from Delsa Grana, [place] sent at 06/21/2019  1:42 PM EDT ----- Please notify pt of lab results (see last note)  Vit D was great, thyroid level was good, but for her sx I will send in a little bit higher dose (137 mcg PO q am daily).  She needs repeat TSH lab in ~ 6 weeks after dose change A1C is still showing prediabetes Her calcium, mag and potassium levels were normal

## 2019-06-22 ENCOUNTER — Other Ambulatory Visit: Payer: Self-pay | Admitting: Family Medicine

## 2019-06-22 DIAGNOSIS — J4530 Mild persistent asthma, uncomplicated: Secondary | ICD-10-CM

## 2019-06-22 MED ORDER — ALBUTEROL SULFATE HFA 108 (90 BASE) MCG/ACT IN AERS: 2.0000 | INHALATION_SPRAY | RESPIRATORY_TRACT | 3 refills | Status: AC | PRN

## 2019-06-22 NOTE — Progress Notes (Signed)
Received notification of non-coverage for albuterol inhaler and tirosint (brand levothyroxine 137 mcg) Pt states she requires brand and also needs dose increase - will do PA Albuterol inhaler, new inhaler ordered. Paperwork completed and given to Cathrine Muster and Rosanne Sack, PA-C 06/22/19 2:01 PM

## 2019-06-23 ENCOUNTER — Encounter: Payer: Self-pay | Admitting: Family Medicine

## 2019-06-25 ENCOUNTER — Other Ambulatory Visit: Payer: Self-pay | Admitting: Family Medicine

## 2019-06-25 MED ORDER — LEVOTHYROXINE SODIUM 137 MCG PO CAPS: 137.0000 ug | ORAL_CAPSULE | Freq: Every day | ORAL | 3 refills | Status: DC

## 2019-06-25 MED ORDER — LEVOTHYROXINE SODIUM 137 MCG PO CAPS: 137.0000 ug | ORAL_CAPSULE | Freq: Every day | ORAL | 0 refills | Status: DC

## 2019-07-02 ENCOUNTER — Encounter: Payer: Self-pay | Admitting: Internal Medicine

## 2019-07-02 ENCOUNTER — Other Ambulatory Visit: Payer: Self-pay

## 2019-07-02 ENCOUNTER — Ambulatory Visit: Payer: Medicare Other | Admitting: Internal Medicine

## 2019-07-02 VITALS — BP 126/76 | HR 84 | Temp 97.9°F | Ht 65.0 in | Wt 185.1 lb

## 2019-07-02 DIAGNOSIS — E7849 Other hyperlipidemia: Secondary | ICD-10-CM

## 2019-07-02 DIAGNOSIS — I493 Ventricular premature depolarization: Secondary | ICD-10-CM

## 2019-07-02 DIAGNOSIS — Z789 Other specified health status: Secondary | ICD-10-CM | POA: Diagnosis not present
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
## 2019-07-02 NOTE — Patient Instructions (Signed)
Medication Instructions:  Dr. Debara Pickett recommends PCSK9. This is an injectable cholesterol medication. This medication will need prior approval with your insurance company, which we will work on. If the medication is not approved initially, we may need to do an appeal with your insurance. We will keep you updated on this process. This medication can be provided at some local pharmacies or be shipped to you from a specialty pharmacy.   If you need a refill on your cardiac medications before your next appointment, please call your pharmacy.   Lab work: FASTING lab work in 3-4 months - prior to next appointment If you have labs (blood work) drawn today and your tests are completely normal, you will receive your results only by: Marland Kitchen MyChart Message (if you have MyChart) OR . A paper copy in the mail If you have any lab test that is abnormal or we need to change your treatment, we will call you to review the results.  Testing/Procedures: NONE  Follow-Up: Dr. Debara Pickett recommends that you schedule a follow up visit with him the in the [place] in 3-4 months. Please have fasting blood work about 1 week prior to this visit and he will review the blood work results with you at your appointment.

## 2019-07-03 ENCOUNTER — Telehealth: Payer: Self-pay | Admitting: Cardiovascular Disease

## 2019-07-03 NOTE — Telephone Encounter (Signed)
Spoke with the patient. Adv her that I have pre-lim the results of her monitor. The patient sts that she was seen by Dr.Hilty yesterday and he was concerned about her PVC burden being >12% and deferred to Dr. Fletcher Anon for management. Adv the patient that I will fwd the msg to Dr. Fletcher Anon and will call back once he has reviewed and given his recommendation. Patient voiced appreciation.

## 2019-07-03 NOTE — Telephone Encounter (Signed)
Patient calling to check status of heart monitor results Please call to discuss

## 2019-07-04 ENCOUNTER — Telehealth: Payer: Self-pay

## 2019-07-04 DIAGNOSIS — I493 Ventricular premature depolarization: Secondary | ICD-10-CM

## 2019-07-04 NOTE — Telephone Encounter (Signed)
Patient made aware of monitor results and Dr. Tyrell Antonio recommendation. Patient is agreeable with the ref to EP. Ref placed for a consult with Dr. Caryl Comes at the [facility] office. Adv the patient that a scheduler will call her to schedule. Patient verbalized understanding and voiced appreciation for the call.

## 2019-07-04 NOTE — Telephone Encounter (Signed)
See results note. 

## 2019-07-04 NOTE — Telephone Encounter (Signed)
-----   Message from Wellington Hampshire, MD sent at 07/04/2019 12:09 PM EDT ----- Inform patient that monitor showed worsening PVCs from before.  I recommend EP consultation with Dr. Caryl Comes given her poor tolerance to medications in the past.

## 2019-07-04 NOTE — Telephone Encounter (Signed)
See 9/2 telephone encounter for documentation.

## 2019-07-10 ENCOUNTER — Telehealth: Payer: Self-pay | Admitting: Internal Medicine

## 2019-07-10 MED ORDER — REPATHA SURECLICK 140 MG/ML ~~LOC~~ SOAJ: 1.0000 | SUBCUTANEOUS | 11 refills | Status: DC

## 2019-07-10 NOTE — Telephone Encounter (Signed)
Lynda Rainwater [phone] - phone number for prior authorization

## 2019-07-10 NOTE — Telephone Encounter (Signed)
Request Reference Number: [id number]. REPATHA SURE INJ 140MG /ML is approved through 01/07/2020.

## 2019-07-10 NOTE — Telephone Encounter (Signed)
Prior authorization for Repatha [employer] submitted via [email or web address]  (Key: [id number])

## 2019-07-18 ENCOUNTER — Other Ambulatory Visit: Payer: Self-pay | Admitting: Family Medicine

## 2019-07-18 MED ORDER — LEVOTHYROXINE SODIUM 137 MCG PO TABS: 137.0000 ug | ORAL_TABLET | Freq: Every day | ORAL | 1 refills | Status: DC

## 2019-07-19 ENCOUNTER — Other Ambulatory Visit: Payer: Self-pay

## 2019-07-19 MED ORDER — REPATHA SURECLICK 140 MG/ML ~~LOC~~ SOAJ: 1.0000 | SUBCUTANEOUS | 11 refills | Status: DC

## 2019-07-30 ENCOUNTER — Ambulatory Visit: Payer: Medicare Other | Admitting: Physician Assistant

## 2019-08-02 ENCOUNTER — Telehealth: Payer: Self-pay | Admitting: Cardiovascular Disease

## 2019-08-02 DIAGNOSIS — Z1231 Encounter for screening mammogram for malignant neoplasm of breast: Secondary | ICD-10-CM | POA: Diagnosis not present

## 2019-08-02 DIAGNOSIS — E038 Other specified hypothyroidism: Secondary | ICD-10-CM | POA: Diagnosis not present

## 2019-08-02 DIAGNOSIS — M25561 Pain in right knee: Secondary | ICD-10-CM | POA: Diagnosis not present

## 2019-08-02 DIAGNOSIS — E7800 Pure hypercholesterolemia, unspecified: Secondary | ICD-10-CM | POA: Diagnosis not present

## 2019-08-02 DIAGNOSIS — I10 Essential (primary) hypertension: Secondary | ICD-10-CM | POA: Diagnosis not present

## 2019-08-02 NOTE — Telephone Encounter (Signed)
Pt was seen by PCP on 8/19. At that time TSH was taken and PCP increased dose for fatigue.   Pt has since experienced more palpitations and chest pain. She reports sx are intermittent and worse in the evening. Chest discomfort at this time is 3/10 but 8/10 in the evenings.   I told patient if she has active chest pain and distress our recommendation is to be evaluated in the ED. Pt refuses saying she is going to be seen at PCP office at 12 today and would like to wait.   Pt has appt scheduled for next Thursday which she would like to move to a sooner date. Next available is Monday with DOD. Pt agrees to this appt. Pt has appt with Dr. Caryl Comes 10/22 for PVC burden of 12 seen on zio report.   After speaking with Ignacia Bayley, NP, he suggested she return to previous dose of levothyroxine in the setting of worsening sx and last TSH WNL.   Gerald Stabs also suggested that when she has blood work today they add a fT4 to labs.   Returned call to patient with suggestions from Ignacia Bayley, NP. Pt verbalized understanding and will seek urgent medical care for worsening sx.   Advised pt to call for any further questions or concerns.

## 2019-08-02 NOTE — Telephone Encounter (Signed)
Patient came by office - thought her appointment was today 10/1 Patient was scheduled for 10/8 with C. Berge Patient was offered 10:20a with Dr Fletcher Anon today but patient refused, stated it conflicted with another appointment Patient was upset and stated that she was having a lot of problems and wanted to be seen ASAP Please call to discuss and advise if we can schedule a sooner appointment

## 2019-08-06 ENCOUNTER — Encounter: Payer: Self-pay | Admitting: Physician Assistant

## 2019-08-06 ENCOUNTER — Ambulatory Visit (INDEPENDENT_AMBULATORY_CARE_PROVIDER_SITE_OTHER): Payer: Medicare Other | Admitting: Physician Assistant

## 2019-08-06 ENCOUNTER — Other Ambulatory Visit: Payer: Self-pay

## 2019-08-06 ENCOUNTER — Other Ambulatory Visit
Admission: RE | Admit: 2019-08-06 | Discharge: 2019-08-06 | Disposition: A | Discharge status: Home / Self Care | Payer: Medicare Other | Source: Ambulatory Visit | Attending: Physician Assistant | Admitting: Physician Assistant

## 2019-08-06 VITALS — BP 120/86 | HR 91 | Ht 65.0 in | Wt 186.0 lb

## 2019-08-06 DIAGNOSIS — I10 Essential (primary) hypertension: Secondary | ICD-10-CM

## 2019-08-06 DIAGNOSIS — R0789 Other chest pain: Secondary | ICD-10-CM | POA: Diagnosis not present

## 2019-08-06 DIAGNOSIS — I493 Ventricular premature depolarization: Secondary | ICD-10-CM | POA: Diagnosis not present

## 2019-08-06 DIAGNOSIS — R0602 Shortness of breath: Secondary | ICD-10-CM | POA: Diagnosis not present

## 2019-08-06 DIAGNOSIS — E785 Hyperlipidemia, unspecified: Secondary | ICD-10-CM

## 2019-08-06 DIAGNOSIS — F419 Anxiety disorder, unspecified: Secondary | ICD-10-CM

## 2019-08-06 DIAGNOSIS — E7849 Other hyperlipidemia: Secondary | ICD-10-CM

## 2019-08-06 DIAGNOSIS — G4730 Sleep apnea, unspecified: Secondary | ICD-10-CM | POA: Diagnosis not present

## 2019-08-06 DIAGNOSIS — G4733 Obstructive sleep apnea (adult) (pediatric): Secondary | ICD-10-CM

## 2019-08-06 DIAGNOSIS — K219 Gastro-esophageal reflux disease without esophagitis: Secondary | ICD-10-CM

## 2019-08-06 DIAGNOSIS — Z789 Other specified health status: Secondary | ICD-10-CM

## 2019-08-06 DIAGNOSIS — Z23 Encounter for immunization: Secondary | ICD-10-CM

## 2019-08-06 DIAGNOSIS — M797 Fibromyalgia: Secondary | ICD-10-CM

## 2019-08-06 DIAGNOSIS — R002 Palpitations: Secondary | ICD-10-CM

## 2019-08-06 DIAGNOSIS — K449 Diaphragmatic hernia without obstruction or gangrene: Secondary | ICD-10-CM

## 2019-08-06 NOTE — Progress Notes (Signed)
Follow-up Outpatient Visit Date: 08/06/2019  Primary Care Provider: Greig Right, MD  [place] [address]   Primary Cardiologist: Kathlyn Sacramento, MD / [place] office   Chief Complaint: Chest pain, palpitations  HPI:  Laura [name] is a 53 y.o. year-old female with history of hypertension, PVCs, chronic atypical chest pain, fibromyalgia, obesity, obstructive sleep apnea, asthma, anxiety, depression, documented intolerances to multiple cardiac medications, and who presents today for urgent evaluation of chest pain.  She follows with nephrology for chronic edema and refractory hypertension and has been found to have discrepancy in renal size but no renal artery stenosis.    She has had extensive cardiac workup including a negative 01/2016 and repeat negative  07/2018 cardiac stress test.  03/2016 Holter monitor showed 10,000 PVCs with repeat 07/2018 Zio showing 5 brief SVT runs and occasional PVCs (2.6% burden). Most Zio showed increased burden of PVCs to 12.7%, prompting upcoming EP appointment (scheduled 08/23/2019). Most recent 07/2018 echo with normal EF and no wall motion abnormalities.    In 05/2019, she was [name] in the office for continued CP, palpitations, fatigue, and shortness of breath that had worsened over the preceding 3 to 4 months. 05/2019 cardiac CTA, however, was without coronary artery stenosis or calcification.  She was started on Repatha by Dr. Debara Pickett, given familial hyperlipidemia with most recent LDL 196 and reported statin intolerance.    Over the last 1-2 weeks, she has noted further increase in severity and frequency of her CP, SOB, and palpitations. Repeat recent 4 day Zio was performed as above with increased burden of PVCs from 2.6% to 12.7% and EP appointment scheduled for 10/22. However, given her increased symptoms, she called the office for an urgent office evaluation today.  Since her last visit, she continues to report atypical chest that is  described as unchanged in character from previous appointments but increasing in frequency and severity. She notes central chest tightness, lasting from minutes to all day. She is unable to report if tender to palpation, given her history of fibromyalgia. She "feels like her heart is going to stop." She continues to check her blood pressure multiple times daily at home with higher SBP noted at night ([id number] systolic). She denies improvement in her BP since increasing her ACEi at the her last office visit.  She notes a new daily headache since her last visit without clear triggers. She continues to deny caffeine or alcohol use, as well as current tobacco use.  She reports a low salt diet with plenty of fruits, vegetables, protein, and low carbs; however, she also notes ongoing weight gain and chronic full body edema, for which she states she continues to follow with nephrology.  Her levothyroxine was recently adjusted by her PCP in August due to elevated TSH and T4 (TSH within limits 06/2019 of note), and she feels this may be contributing to her worsening symptoms. She notes progressive shortness of breath and dyspnea on exertion but denies recent travel, asymmetrical edema (beyond her normal) / erythema / warmth / or palpable cord. She does note significantly decreased activity due to right knee pain / OA, with plans for possible surgery per patient. She notes interittent SOB at rest, occuring sometimes when laying flat in bed or even when rolling over to her side as well as DOE. A few nights before her clinic visit today, she awoke from sleep with a feeling of struggling for air or choking though remains compliant with her CPAP.  She feels she may need her CPAP mask refit or a repeat sleep study. She has not been sleeping well but denied any other recent social stressors, medication changes, or diet changes.   --------------------------------------------------------------------------------------------------  Past  Medical History:  Diagnosis Date   Anxiety and depression    Asthma    Bacterial vaginitis    Bowel obstruction ([place])    Chest pain    a. 01/2016 Ex MV: Hypertensive response. Freq PVCs w/ exercise. nl EF. No ST/T changes. No ischemia.   Complex ovarian cyst, left 03/08/2017   COPD (chronic obstructive pulmonary disease) (HCC)    Cystocele    Elevated fasting blood sugar    Exposure to hepatitis C    Fibromyalgia    GERD (gastroesophageal reflux disease)    Heart murmur    a. 03/2016 Echo: EF 60-65%, no rwma, mild MR, nl LA size, nl RV fxn.   High cholesterol    Hypertension    Hypokalemia    IBS (irritable bowel syndrome)    Increased BMI    Kidney stones    Musculoskeletal pain 03/02/2016   Nasal septal perforation 05/12/2018   Hx of cocaine use   Osteoarthritis    Palpitations    a. 03/2016 Holter: Sinus rhythm, avg HR 83, max 123, min 64. 4 PACs. 10,356 isolated PVCs, one vent couplet, 3842 V bigeminy, 4 beats NSVT->prev on BB - dc 2/2 swelling.   Prediabetes 12/23/2015   Overview:  Hba1c higher but not diabetic. Took metformin to try to lessen   Raynaud disease    Rectocele    Sleep apnea    Urinary retention with incomplete bladder emptying    Vaginal dryness, menopausal    Vaginal enterocele    Vitamin D deficiency 12/03/2014   Yeast infection    Past Surgical History:  Procedure Laterality Date   64 HOUR [place] STUDY N/A 10/11/2018   Procedure: 24 HOUR PH STUDY;  Surgeon: Mauri Pole, MD;  Location: [facility];  Service: Endoscopy;  Laterality: N/A;   ABDOMINAL HYSTERECTOMY     ANKLE SURGERY     ran over by mother in car by [place]  2015   posterior and enterocele ligation   CYSTOSCOPY  04/11/2017   Procedure: CYSTOSCOPY;  Surgeon: Defrancesco, Alanda Slim, MD;  Location: [facility];  Service: Gynecology;;   ESOPHAGEAL MANOMETRY N/A 10/11/2018   Procedure: ESOPHAGEAL MANOMETRY  (EM);  Surgeon: Mauri Pole, MD;  Location: [facility];  Service: Endoscopy;  Laterality: N/A;   LAPAROSCOPIC SALPINGO OOPHERECTOMY Left 04/11/2017   Procedure: LAPAROSCOPIC LEFT SALPINGO OOPHORECTOMY;  Surgeon: Brayton Mars, MD;  Location: [facility] ORS;  Service: Gynecology;  Laterality: Left;   LITHOTRIPSY     OOPHORECTOMY     PARTIAL HYSTERECTOMY     [place] IMPEDANCE STUDY N/A 10/11/2018   Procedure: [place] IMPEDANCE STUDY;  Surgeon: Mauri Pole, MD;  Location: [facility];  Service: Endoscopy;  Laterality: N/A;   thumb surgery     UPPER GASTROINTESTINAL ENDOSCOPY      Current Meds  Medication Sig   albuterol (VENTOLIN HFA) 108 (90 Base) MCG/ACT inhaler Inhale 2 puffs into the lungs every 4 (four) hours as needed for wheezing or shortness of breath.   ALPRAZolam (XANAX) 1 MG tablet Take 1 mg by mouth 4 (four) times daily.    amphetamine-dextroamphetamine (ADDERALL) 30 MG tablet Take 30 mg by mouth 3 (three) times daily.  diclofenac sodium (VOLTAREN) 1 % GEL Apply 2-4 g topically 2 (two) times daily as needed. To knees   Evolocumab (REPATHA SURECLICK) [id number] MG/ML SOAJ Inject 1 Dose into the skin every 14 (fourteen) days.   fluticasone (FLONASE) 50 MCG/ACT nasal spray Place 2 sprays into both nostrils daily.   gabapentin (NEURONTIN) 600 MG tablet Take 600 mg by mouth 3 (three) times daily.   glucose blood ([employer] TEST STRIPS) test strip    levothyroxine (SYNTHROID) 112 MCG tablet Take 112 mcg by mouth daily.   magnesium oxide (MAG-OX) 400 MG tablet Take 1 tablet (400 mg total) by mouth daily.   pantoprazole (PROTONIX) 40 MG tablet Take 1 tablet (40 mg total) by mouth daily.   quinapril (ACCUPRIL) 20 MG tablet Take 1 tablet (20 mg total) by mouth 2 (two) times a day.   tiZANidine (ZANAFLEX) 4 MG tablet Take 4 mg by mouth 3 (three) times daily.    torsemide (DEMADEX) 100 MG tablet Take 100 mg by mouth daily.   zolpidem (AMBIEN) 10 MG tablet Take 10 mg  by mouth at bedtime.    Allergies: Meperidine, Shellfish allergy, Diltiazem, Acebutolol, Amlodipine, Codeine, Cymbalta [duloxetine hcl], Dexilant [dexlansoprazole], Hydrocodone, Losartan, Metoprolol, Mirtazapine, Omeprazole, Oxycodone, Sectral [acebutolol hcl], and Tramadol  Social History   Tobacco Use   Smoking status: Former Smoker    Quit date: 04/08/1995    Years since quitting: 24.3   Smokeless tobacco: Never Used  Substance Use Topics   Alcohol use: Not Currently    Alcohol/week: 1.0 standard drinks    Types: 1 Glasses of wine per week    Comment: rare; once every 6 months   Drug use: No    Family History  Problem Relation Age of Onset   Stroke Father    Diabetes Father    Breast cancer Mother 33   Diverticulitis Mother    Esophageal cancer Maternal Grandfather    Colon cancer Paternal Grandmother    Ovarian cancer Neg Hx    Stomach cancer Neg Hx     Review of Systems: A 12-system review of systems was performed and was negative except as noted in the HPI.  Previous CV studies: 07/05/2018 Echo - Left ventricle: The cavity size was normal. Systolic function was   normal. The estimated ejection fraction was in the range of 60%   to 65%. Wall motion was normal; there were no regional wall   motion abnormalities. Left ventricular diastolic function   parameters were normal. - Aortic valve: There was mild regurgitation. - Mitral valve: There was mild regurgitation. - Left atrium: The atrium was normal in size. - Right ventricle: Systolic function was normal. - Pulmonary arteries: Systolic pressure was within the normal   range.  Holter Monitor  06/2019 4-day ZIO patch monitor: Normal sinus rhythm with an average heart rate of 76 bpm. Rare PACs. Frequent PVCs with a total of 53,000 beats in 3 days representing a 12.7% burden.  Occasional ventricular bigeminy and trigeminy. Notes recorded by Wellington Hampshire, MD on 07/04/2019 at 12:09 PM EDT  Inform  patient that monitor showed worsening PVCs from before. I recommend EP consultation with Dr. Caryl Comes given her poor tolerance to medications in the past.  05/2019 CTA Coronary FINDINGS: Non-cardiac: See separate report from [facility]. Pulmonary veins drain normally to the left atrium. Calcium Score: 0 Agatston units. Coronary Arteries: Right dominant with no anomalies LM: No plaque or stenosis. LAD system:  No plaque or stenosis. Circumflex system: No plaque or  stenosis. RCA system:  No plaque or stenosis. IMPRESSION: 1. Coronary artery calcium score 0 Agatston units. This suggests low risk for future cardiac events.   --------------------------------------------------------------------------------------------------  Physical Exam:  Vital Signs. BP 120/86 (BP Location: Left Arm, Patient Position: Sitting, Cuff Size: Normal)    Pulse 91    Ht 5\' 5"  (1.651 m)    Wt 186 lb (84.4 kg)    BMI 30.95 kg/m  General: Well developed, well nourished, in no acute distress.  Sitting comfortably on the table. Head: Normocephalic, atraumatic, nares are without discharge. Neck: Negative for carotid bruits. JVP not elevated. Lungs: Clear bilaterally to auscultation without wheezes, rales, or rhonchi. Breathing is unlabored. Heart: RRR, extrasystole appreciated, S1 S2 without murmurs, rubs, or gallops.  Abdomen: Soft, non-tender, non-distended with normoactive bowel sounds. No rebound/guarding. Extremities: No clubbing or cyanosis.  Mild nonpitting bilateral edema with R>L (chronic finding). Distal pedal pulses are 1+ and equal bilaterally. Neuro: Alert and oriented X 3. Moves all extremities spontaneously. Psych:  Responds to questions appropriately with a normal affect.   EKG: Normal sinus rhythm, 91 bpm, QRS 86 ms, QTC 462 left axis deviation, no acute changes from previous EKG.  Lab Results  Component Value Date   WBC 4.6 06/20/2019   HGB 14.9 06/20/2019   HCT 46.7 (H) 06/20/2019    MCV 87.1 06/20/2019   PLT 324 06/20/2019    Lab Results  Component Value Date   NA 140 06/20/2019   K 4.4 06/20/2019   CL 101 06/20/2019   CO2 31 06/20/2019   BUN 24 06/20/2019   CREATININE 0.88 06/20/2019   GLUCOSE 104 (H) 06/20/2019   ALT 13 06/20/2019    Lab Results  Component Value Date   CHOL 306 (H) 06/20/2019   HDL 50 06/20/2019   LDLCALC 196 (H) 06/20/2019   LDLDIRECT 222 (H) 04/26/2019   TRIG 356 (H) 06/20/2019   CHOLHDL 6.1 (H) 06/20/2019    --------------------------------------------------------------------------------------------------  ASSESSMENT AND PLAN:  Atypical chest pain without history of CAD - Worsening episodes of chest pain over last 1 week with patient concern regarding relation to recent change in thyroid medication. Ongoing chronic atypical chest pain, occuring at both rest and with exertion and without clearly identified triggers.  Previous cardiac work-up without evidence of ischemia, coronary calcium score 0, normal EF as copied above. --Long discussion today regarding possible further work-up options. (1) Recommended workup given self reported GERD, hiatal hernia, gastric ulcers, past self reported EGD /colonoscopy, and possible past diagnosis of Barrett's esophagus. Patient feels strongly symptoms are not related to GI issues with current preference to defer further GI workup at this time.(2) [place] cardiopulmonary exercise testing discussed/recommended, given SOB/DOE; however, this was declined by patient due to right knee pain and as patient states she cannot walk on a treadmill. (3) Discussed new labs - Recent labs obtained 06/20/2019 with stable renal function and electrolytes. Given symptom constellation described above, ordered plasma and 24h metanephrine / catecholamine levels to r/o pheochromacytoma. Discussed tentative plan for fasting morning cortisol at next follow-up with primary cardiologist in 2 months, which should thus be scheduled as an  AM appointment for this morning cortisol lab.(4) Discussed OSA follow-up with Dr. Radford Pax, as below. (5) Discussed the need to keep her EP appointment on 10/22, at which time will revisit need for updated echo with most recent echo as above.   --Plan: Obtain plasma / urine metanephrine and catecholamine labs. EP evaluation 10/22, at which time will revisit updating echo. Referral  to Dr. Golden Hurter in [place] for OSA. AM fasting cortisol labs at next follow-up with primary cardiologist in 2 months. Continue medical management without changes.   Symptomatic PVCs - Ongoing symptomatic PVCs with worsening symptoms over the last week, after patient reports PCP changed dose of levothyroxine. Previous cardiac workup unrevealing and without evidence of ischemia, CAD - echo with nl EF. Most recent 4d ZIO showed increased burden of PVCs from 2%  12%.  --Most recent TSH reportedly abnormal per patient; however, most recent lab within limits and documentation noting medication change due to patient symptoms - defer further workup to PCP. Most recent renal function an electrolytes stable.  --Labs: plasma / urine catecholamine and metanephrine labs as above. Fasting AM cortisol at 2 month follow-up with primary cardiologist. No medication changes. Given patient intolerances, current medical therapy options are also limited. Follow-up with EP on 08/23/2019 with echo update deferred to EP follow-up.  Essential hypertension - Negative work-up for secondary hypertension in the past, including renal artery stenosis.  Followed by nephrology.   --Blood pressure today well controlled; however, patient reports home blood pressure with systolic into the Q000111Q at night.  Recently increased ACEi (Quinapril) without improved BP at home, per patient and despite well controlled BP today.  --No medication changes. Therapy limited by patient's medication intolerances.   --Continue to monitor blood pressure at home with  recommendation to monitor twice daily in a consistent manner and at consistent times each day.  Hyperlipidemia - Total cholesterol 306 with LDL 196 on most recent labs. --Started on Repatha for statin intolerance with family hyperlipidemia as above with plan for repeat lipid panel and LP (a) at follow-up with Dr. Debara Pickett in 3 months.  --Referred to Dr. Broadus John, geneticist, for further evaluation as noted by Dr. Debara Pickett as a genetic abnormality would be helpful to determine and could be used for cascade screening and implementation of appropriate medical therapies.  OSA --Known OSA within 2018 sleep study with [facility] pulmonology/Dr. Mortimer Fries. Compliant with CPAP. Recently notes awakes choking / struggling to breathe. Feels may need repeat study / settings changed on CPAP, or new mask. Referral provided for follow-up with Dr. Golden Hurter, given most recent symptoms. Patient prefers appointment with Dr. Radford Pax over repeat appointment with Dr. Mortimer Fries at this time.    Fibromyalgia --Known history of fibromyalgia, which could be contributing to chest discomfort.  Flu shot --Performed today.  Prolonged discussion took place today regarding patient treatment and care /treatment options and lasting at least 1 hour.   Disposition: Follow-up with EP 10/22 and primary cardiologist in 2 months. Referral to Dr. Radford Pax. Metanephrine /catecholamine labs as above and including flu shot with future AM cortisol at 2 month follow-up with primary cardiologist. No medication changes.   SignedArvil Chaco, PA-C 08/07/2019, 12:07 AM Pager [phone]

## 2019-08-06 NOTE — Patient Instructions (Addendum)
Medication Instructions:  Your physician recommends that you continue on your current medications as directed. Please refer to the Current Medication list given to you today.  If you need a refill on your cardiac medications before your next appointment, please call your pharmacy.   Lab work: Your physician recommends that you return for lab work in: TODAY at the [place] - Serum metanephrine, serum catecholamines. Please go to the [facility]. You will check in at the front desk to the right as you walk into the atrium. Valet Parking is offered if needed.  Lab will also give you a container with instructions on collecting 24 hour urine.   If you have labs (blood work) drawn today and your tests are completely normal, you will receive your results only by: Marland Kitchen MyChart Message (if you have MyChart) OR . A paper copy in the mail If you have any lab test that is abnormal or we need to change your treatment, we will call you to review the results.  Testing/Procedures: NONE  Follow-Up: You have been referred to Cardiologist Dr Fransico Him at [facility] in [place].    At [facility], you and your health needs are our priority.  As part of our continuing mission to provide you with exceptional heart care, we have created designated Provider Care Teams.  These Care Teams include your primary Cardiologist (physician) and Advanced Practice Providers (APPs -  Physician Assistants and Nurse Practitioners) who all work together to provide you with the care you need, when you need it. You will need a follow up appointment in 2 months with Dr Fletcher Anon (morning appointment please). You may see DR Fletcher Anon or one of the following Advanced Practice Providers on your designated Care Team:   Murray Hodgkins, NP Christell Faith, PA-C . Marrianne Mood, PA-C   24-Hour Urine Collection A 24-hour urine specimen is a lab test that requires collection of all your urine for an entire day. This is  sometimes called a timed urine test. It can provide more information than a single urine sample. There are many reasons to have this test. Your health care provider may order the test to check for or monitor the following conditions:  High blood pressure.  Kidney disease.  Kidney stones.  Urinary tract infections.  Pregnancy.  Diabetes. How do I prepare for this test?  You may be asked to follow a special diet during or before the collection period. Follow any instructions from your health care provider. If no special instructions are given, you may eat and drink normally.  Take over-the-counter and prescription medicines only as told by your health care provider.  Let your health care provider know about any medicines that you are taking, including over-the-counter medicines, vitamins, herbs, and supplements.  Choose a collection day when you can be at home or when you have a place to store the urine. All urine must be collected during the testing period. How do I do a 24-hour urine collection?   When you get up in the morning, urinate in the toilet and flush. Write down the time. This will be your start time on the day of collection and your end time on the next morning.  From the start time on, all of your urine should be kept in the collection jug that you received from the lab.  If the jug that is given to you already has liquid in it, that is okay. Do not throw out the liquid or rinse out  the jug. Some tests need the liquid to be added to your urine.  Urinate into a specimen container, such as a urinal or pan that sits over the toilet. Pour the urine from the container into the collection jug. Be careful not to spill any of the urine. Use the equipment provided by the lab.  Do not let any toilet paper or stool (feces) get into the jug. This will contaminate the sample.  Stop collecting your urine 24 hours after you started. Collect the last specimen as close as possible to  the end of the 24-hour period.  Keep the jug cool in an ice chest or keep it in the refrigerator during collection.  When the 24-hour collection is complete, bring the jug to the lab. Keep the jug cool in an ice chest while you are bringing it to the lab. What do the results mean? Talk with your health care provider about what your results mean. Questions to ask your health care provider Ask your health care provider, or the department that is doing the test:  When will my results be ready?  How will I get my results?  What are my treatment options?  What other tests do I need?  What are my next steps? Summary  A 24-hour urine specimen is a lab test that requires collection of all your urine for an entire day.  When you get up in the morning, urinate in the toilet and flush. Write down the time. For the next 24 hours, collect all of your urine in the collection jug that you received from the lab.  Keep the jug cool while collecting the urine and while bringing it back to the lab.  Take the jug of urine back to the lab as soon as possible after the collection period has ended. This information is not intended to replace advice given to you by your health care provider. Make sure you discuss any questions you have with your health care provider. Document Released: 01/14/2009 Document Revised: 02/05/2019 Document Reviewed: 10/31/2017 Elsevier Patient Education  2020 [employer].

## 2019-08-07 ENCOUNTER — Other Ambulatory Visit: Payer: Self-pay

## 2019-08-07 ENCOUNTER — Telehealth: Payer: Self-pay | Admitting: Cardiovascular Disease

## 2019-08-07 DIAGNOSIS — R002 Palpitations: Secondary | ICD-10-CM

## 2019-08-07 NOTE — Telephone Encounter (Signed)
Called patient to scheduled fu appts. Per avs 08/06/19   Patient wants to clarify if she needs to fast for this appt in December due to cortisol  Lab and also to clarify if it is ok to wait until 920 am ?  Patient feels like it was stressed to her to see an MD and to also come as early in the morning as possible.     Please advise is 920 is ok time and also if ok to see APP per patient request.

## 2019-08-07 NOTE — Telephone Encounter (Signed)
Spoke with the patient. The patient is in agreement with having her fasting cortisol drawn first thing in the morning at her local [place]. Verified the patient's mailing address. Lab orders mailed to the patient.  2 month f/u appt scheduled for Dr. Fletcher Anon on 10/12/19 @ 2:40pm. Patient is aware of the appt date and time.  Patient verbalized understanding to the information given and voiced appreciation for the call.

## 2019-08-07 NOTE — Telephone Encounter (Signed)
Yes, please have her fast prior to the AM cortisol lab, which should be obtained as early in the morning as possible; therefore, an 8AM appointment is indicated.   If I recall correctly, she has an hour drive to our office, making this an early day for her - unfortunately, the timing of this lab is important.    Yes, we did discuss follow-up with her primary cardiologist (MD) at her next follow-up visit, as it is important to touch base with her primary MD from time to time. However, if that is still her specific preference, an APP is fine as well.

## 2019-08-07 NOTE — Telephone Encounter (Signed)
Will route to ordering provider Marrianne Mood, PA

## 2019-08-07 NOTE — Telephone Encounter (Signed)
Called to give the patient's Laura [name]'s response. Lmtcb.  We could mail the lab orders and she could go to her local [facility] in [place]. The o/v with Dr. Fletcher Anon can the be scheduled when available.

## 2019-08-07 NOTE — Telephone Encounter (Signed)
Patient returning call.

## 2019-08-08 DIAGNOSIS — Z1231 Encounter for screening mammogram for malignant neoplasm of breast: Secondary | ICD-10-CM | POA: Diagnosis not present

## 2019-08-09 ENCOUNTER — Encounter

## 2019-08-09 ENCOUNTER — Ambulatory Visit: Payer: Medicare Other | Admitting: Nurse Practitioner

## 2019-08-12 LAB — METANEPHRINES, PLASMA
Metanephrine, Free: 20.5 pg/mL (ref 0.0–88.0)
Normetanephrine, Free: 95.9 pg/mL (ref 0.0–136.8)

## 2019-08-13 DIAGNOSIS — M25561 Pain in right knee: Secondary | ICD-10-CM | POA: Diagnosis not present

## 2019-08-13 DIAGNOSIS — G8929 Other chronic pain: Secondary | ICD-10-CM | POA: Diagnosis not present

## 2019-08-13 DIAGNOSIS — M1711 Unilateral primary osteoarthritis, right knee: Secondary | ICD-10-CM | POA: Diagnosis not present

## 2019-08-13 NOTE — Progress Notes (Signed)
Patient plans for collection of cortisol lab early morning and before her next appointment with her primary cardiologist on 12/11.

## 2019-08-15 ENCOUNTER — Encounter: Payer: Self-pay | Admitting: Family Medicine

## 2019-08-15 LAB — CATECHOLAMINES, FRACTIONATED, PLASMA
Dopamine: 31 pg/mL (ref 0–48)
Epinephrine: <15 pg/mL (ref 0–62)
Norepinephrine: 678 pg/mL (ref 0–874)

## 2019-08-16 ENCOUNTER — Encounter: Payer: Self-pay | Admitting: Physician Assistant

## 2019-08-16 ENCOUNTER — Encounter: Payer: Self-pay | Admitting: Family Medicine

## 2019-08-16 NOTE — Telephone Encounter (Signed)
This encounter was created in error - please disregard.

## 2019-08-21 ENCOUNTER — Other Ambulatory Visit: Payer: Self-pay | Admitting: Physician Assistant

## 2019-08-21 DIAGNOSIS — G4730 Sleep apnea, unspecified: Secondary | ICD-10-CM | POA: Diagnosis not present

## 2019-08-21 DIAGNOSIS — R0602 Shortness of breath: Secondary | ICD-10-CM | POA: Diagnosis not present

## 2019-08-21 DIAGNOSIS — R002 Palpitations: Secondary | ICD-10-CM | POA: Diagnosis not present

## 2019-08-22 LAB — CORTISOL: Cortisol: 1.8 ug/dL
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
## 2019-08-23 ENCOUNTER — Encounter: Payer: Self-pay | Admitting: Internal Medicine

## 2019-08-23 ENCOUNTER — Ambulatory Visit (INDEPENDENT_AMBULATORY_CARE_PROVIDER_SITE_OTHER): Payer: Medicare Other | Admitting: Internal Medicine

## 2019-08-23 ENCOUNTER — Telehealth: Payer: Self-pay | Admitting: Internal Medicine

## 2019-08-23 VITALS — BP 130/70 | HR 71 | Ht 65.0 in | Wt 194.0 lb

## 2019-08-23 DIAGNOSIS — I493 Ventricular premature depolarization: Secondary | ICD-10-CM | POA: Diagnosis not present

## 2019-08-23 MED ORDER — FLECAINIDE ACETATE 50 MG PO TABS: 50.0000 mg | ORAL_TABLET | Freq: Two times a day (BID) | ORAL | 6 refills | Status: DC

## 2019-08-23 NOTE — Telephone Encounter (Signed)
December schedule not available to schedule

## 2019-08-23 NOTE — Patient Instructions (Signed)
Medication Instructions:  - Your physician has recommended you make the following change in your medication:   1) Start Flecainide 50 mg- take 1 tablet by mouth twice daily  *If you need a refill on your cardiac medications before your next appointment, please call your pharmacy*  Lab Work: - none ordered  If you have labs (blood work) drawn today and your tests are completely normal, you will receive your results only by: Marland Kitchen MyChart Message (if you have MyChart) OR . A paper copy in the mail If you have any lab test that is abnormal or we need to change your treatment, we will call you to review the results.  Testing/Procedures: - none ordered  Follow-Up: At [facility], you and your health needs are our priority.  As part of our continuing mission to provide you with exceptional heart care, we have created designated Provider Care Teams.  These Care Teams include your primary Cardiologist (physician) and Advanced Practice Providers (APPs -  Physician Assistants and Nurse Practitioners) who all work together to provide you with the care you need, when you need it.  Your next appointment:   8-10 weeks  The format for your next appointment:   In Person  Provider:   Virl Axe, MD  Other Instructions - N/A

## 2019-08-27 ENCOUNTER — Other Ambulatory Visit: Payer: Self-pay

## 2019-08-27 DIAGNOSIS — R002 Palpitations: Secondary | ICD-10-CM

## 2019-08-27 NOTE — Progress Notes (Signed)
mychart message sent to patient to make her aware of lab ordered.

## 2019-08-29 ENCOUNTER — Other Ambulatory Visit
Admission: RE | Admit: 2019-08-29 | Discharge: 2019-08-29 | Disposition: A | Discharge status: Home / Self Care | Payer: Medicare Other | Source: Ambulatory Visit | Attending: Physician Assistant | Admitting: Physician Assistant

## 2019-08-29 DIAGNOSIS — R002 Palpitations: Secondary | ICD-10-CM | POA: Insufficient documentation

## 2019-08-29 DIAGNOSIS — G4730 Sleep apnea, unspecified: Secondary | ICD-10-CM

## 2019-08-29 DIAGNOSIS — R0602 Shortness of breath: Secondary | ICD-10-CM

## 2019-08-29 LAB — CORTISOL: Cortisol, Plasma: 9.4 ug/dL

## 2019-08-29 LAB — CATECHOLAMINES, FRACTIONATED, URINE, 24 HOUR
Dopamine , 24H Ur: 198 ug/24 hr (ref 0–510)
Dopamine, Rand Ur: 66 ug/L
Epinephrine, 24H Ur: <3 ug/24 hr (ref 0–20)
Epinephrine, Rand Ur: <1 ug/L
Norepinephrine, 24H Ur: 24 ug/24 hr (ref 0–135)
Norepinephrine, Rand Ur: 8 ug/L

## 2019-08-30 DIAGNOSIS — M5489 Other dorsalgia: Secondary | ICD-10-CM | POA: Diagnosis not present

## 2019-08-30 DIAGNOSIS — K922 Gastrointestinal hemorrhage, unspecified: Secondary | ICD-10-CM | POA: Diagnosis not present

## 2019-08-30 DIAGNOSIS — Z743 Need for continuous supervision: Secondary | ICD-10-CM | POA: Diagnosis not present

## 2019-08-30 DIAGNOSIS — K921 Melena: Secondary | ICD-10-CM | POA: Diagnosis not present

## 2019-08-30 DIAGNOSIS — R1032 Left lower quadrant pain: Secondary | ICD-10-CM | POA: Diagnosis not present

## 2019-08-30 DIAGNOSIS — R58 Hemorrhage, not elsewhere classified: Secondary | ICD-10-CM | POA: Diagnosis not present

## 2019-08-30 DIAGNOSIS — R1031 Right lower quadrant pain: Secondary | ICD-10-CM | POA: Diagnosis not present

## 2019-08-30 DIAGNOSIS — E039 Hypothyroidism, unspecified: Secondary | ICD-10-CM | POA: Diagnosis not present

## 2019-08-30 DIAGNOSIS — I493 Ventricular premature depolarization: Secondary | ICD-10-CM | POA: Diagnosis not present

## 2019-08-30 DIAGNOSIS — R1084 Generalized abdominal pain: Secondary | ICD-10-CM | POA: Diagnosis not present

## 2019-08-30 DIAGNOSIS — I10 Essential (primary) hypertension: Secondary | ICD-10-CM | POA: Diagnosis not present

## 2019-08-30 DIAGNOSIS — K219 Gastro-esophageal reflux disease without esophagitis: Secondary | ICD-10-CM | POA: Diagnosis not present

## 2019-08-30 DIAGNOSIS — N200 Calculus of kidney: Secondary | ICD-10-CM | POA: Diagnosis not present

## 2019-08-31 DIAGNOSIS — K921 Melena: Secondary | ICD-10-CM | POA: Diagnosis not present

## 2019-08-31 DIAGNOSIS — K625 Hemorrhage of anus and rectum: Secondary | ICD-10-CM | POA: Diagnosis not present

## 2019-09-01 ENCOUNTER — Other Ambulatory Visit: Payer: Self-pay

## 2019-09-01 ENCOUNTER — Observation Stay (HOSPITAL_COMMUNITY)
Admission: AD | Admit: 2019-09-01 | Discharge: 2019-09-02 | Disposition: A | Discharge status: Home / Self Care | Payer: Medicare Other | Source: Other Acute Inpatient Hospital | Attending: Internal Medicine | Admitting: Internal Medicine

## 2019-09-01 ENCOUNTER — Encounter (HOSPITAL_COMMUNITY): Payer: Self-pay | Admitting: Internal Medicine

## 2019-09-01 DIAGNOSIS — J4540 Moderate persistent asthma, uncomplicated: Secondary | ICD-10-CM | POA: Diagnosis present

## 2019-09-01 DIAGNOSIS — Z7951 Long term (current) use of inhaled steroids: Secondary | ICD-10-CM | POA: Insufficient documentation

## 2019-09-01 DIAGNOSIS — I10 Essential (primary) hypertension: Secondary | ICD-10-CM | POA: Diagnosis not present

## 2019-09-01 DIAGNOSIS — K644 Residual hemorrhoidal skin tags: Secondary | ICD-10-CM | POA: Diagnosis not present

## 2019-09-01 DIAGNOSIS — K589 Irritable bowel syndrome without diarrhea: Secondary | ICD-10-CM | POA: Diagnosis not present

## 2019-09-01 DIAGNOSIS — Z79899 Other long term (current) drug therapy: Secondary | ICD-10-CM | POA: Insufficient documentation

## 2019-09-01 DIAGNOSIS — Z791 Long term (current) use of non-steroidal anti-inflammatories (NSAID): Secondary | ICD-10-CM | POA: Insufficient documentation

## 2019-09-01 DIAGNOSIS — J4530 Mild persistent asthma, uncomplicated: Secondary | ICD-10-CM | POA: Diagnosis present

## 2019-09-01 DIAGNOSIS — M1990 Unspecified osteoarthritis, unspecified site: Secondary | ICD-10-CM | POA: Insufficient documentation

## 2019-09-01 DIAGNOSIS — R109 Unspecified abdominal pain: Secondary | ICD-10-CM

## 2019-09-01 DIAGNOSIS — F909 Attention-deficit hyperactivity disorder, unspecified type: Secondary | ICD-10-CM | POA: Diagnosis not present

## 2019-09-01 DIAGNOSIS — I493 Ventricular premature depolarization: Secondary | ICD-10-CM | POA: Insufficient documentation

## 2019-09-01 DIAGNOSIS — K228 Other specified diseases of esophagus: Secondary | ICD-10-CM | POA: Diagnosis not present

## 2019-09-01 DIAGNOSIS — F329 Major depressive disorder, single episode, unspecified: Secondary | ICD-10-CM | POA: Diagnosis not present

## 2019-09-01 DIAGNOSIS — J449 Chronic obstructive pulmonary disease, unspecified: Secondary | ICD-10-CM | POA: Diagnosis not present

## 2019-09-01 DIAGNOSIS — K625 Hemorrhage of anus and rectum: Secondary | ICD-10-CM

## 2019-09-01 DIAGNOSIS — K641 Second degree hemorrhoids: Secondary | ICD-10-CM | POA: Diagnosis not present

## 2019-09-01 DIAGNOSIS — G4730 Sleep apnea, unspecified: Secondary | ICD-10-CM | POA: Diagnosis not present

## 2019-09-01 DIAGNOSIS — K6389 Other specified diseases of intestine: Secondary | ICD-10-CM | POA: Insufficient documentation

## 2019-09-01 DIAGNOSIS — F419 Anxiety disorder, unspecified: Secondary | ICD-10-CM | POA: Diagnosis not present

## 2019-09-01 DIAGNOSIS — E782 Mixed hyperlipidemia: Secondary | ICD-10-CM | POA: Diagnosis present

## 2019-09-01 DIAGNOSIS — R1030 Lower abdominal pain, unspecified: Secondary | ICD-10-CM | POA: Diagnosis not present

## 2019-09-01 DIAGNOSIS — E039 Hypothyroidism, unspecified: Secondary | ICD-10-CM | POA: Diagnosis present

## 2019-09-01 DIAGNOSIS — Z8711 Personal history of peptic ulcer disease: Secondary | ICD-10-CM | POA: Insufficient documentation

## 2019-09-01 DIAGNOSIS — I129 Hypertensive chronic kidney disease with stage 1 through stage 4 chronic kidney disease, or unspecified chronic kidney disease: Secondary | ICD-10-CM | POA: Diagnosis not present

## 2019-09-01 DIAGNOSIS — J4520 Mild intermittent asthma, uncomplicated: Secondary | ICD-10-CM | POA: Diagnosis present

## 2019-09-01 DIAGNOSIS — Z87442 Personal history of urinary calculi: Secondary | ICD-10-CM | POA: Insufficient documentation

## 2019-09-01 DIAGNOSIS — K922 Gastrointestinal hemorrhage, unspecified: Secondary | ICD-10-CM

## 2019-09-01 DIAGNOSIS — K2270 Barrett's esophagus without dysplasia: Secondary | ICD-10-CM | POA: Diagnosis not present

## 2019-09-01 DIAGNOSIS — Z87891 Personal history of nicotine dependence: Secondary | ICD-10-CM | POA: Diagnosis not present

## 2019-09-01 DIAGNOSIS — Z20828 Contact with and (suspected) exposure to other viral communicable diseases: Secondary | ICD-10-CM | POA: Diagnosis not present

## 2019-09-01 DIAGNOSIS — N189 Chronic kidney disease, unspecified: Secondary | ICD-10-CM | POA: Diagnosis not present

## 2019-09-01 DIAGNOSIS — I7300 Raynaud's syndrome without gangrene: Secondary | ICD-10-CM | POA: Insufficient documentation

## 2019-09-01 DIAGNOSIS — K921 Melena: Secondary | ICD-10-CM | POA: Diagnosis not present

## 2019-09-01 DIAGNOSIS — M797 Fibromyalgia: Secondary | ICD-10-CM | POA: Diagnosis not present

## 2019-09-01 DIAGNOSIS — J45901 Unspecified asthma with (acute) exacerbation: Secondary | ICD-10-CM | POA: Diagnosis present

## 2019-09-01 DIAGNOSIS — K219 Gastro-esophageal reflux disease without esophagitis: Secondary | ICD-10-CM | POA: Diagnosis not present

## 2019-09-01 DIAGNOSIS — Z79890 Hormone replacement therapy (postmenopausal): Secondary | ICD-10-CM | POA: Insufficient documentation

## 2019-09-01 DIAGNOSIS — R339 Retention of urine, unspecified: Secondary | ICD-10-CM

## 2019-09-01 HISTORY — DX: Gastrointestinal hemorrhage, unspecified: K92.2

## 2019-09-01 LAB — COMPREHENSIVE METABOLIC PANEL
ALT: 26 U/L (ref 0–44)
AST: 19 U/L (ref 15–41)
Albumin: 3.5 g/dL (ref 3.5–5.0)
Alkaline Phosphatase: 64 U/L (ref 38–126)
Anion gap: 12 (ref 5–15)
BUN: 16 mg/dL (ref 6–20)
CO2: 26 mmol/L (ref 22–32)
Calcium: 9.3 mg/dL (ref 8.9–10.3)
Chloride: 102 mmol/L (ref 98–111)
Creatinine, Ser: 0.84 mg/dL (ref 0.44–1.00)
GFR calc Af Amer: >60 mL/min (ref >60)
GFR calc non Af Amer: >60 mL/min (ref >60)
Glucose, Bld: 124 mg/dL — ABNORMAL HIGH (ref 70–99)
Potassium: 3.9 mmol/L (ref 3.5–5.1)
Sodium: 140 mmol/L (ref 135–145)
Total Bilirubin: 0.6 mg/dL (ref 0.3–1.2)
Total Protein: 5.9 g/dL — ABNORMAL LOW (ref 6.5–8.1)

## 2019-09-01 LAB — GLUCOSE, CAPILLARY
Glucose-Capillary: 99 mg/dL (ref 70–99)
Glucose-Capillary: 103 mg/dL — ABNORMAL HIGH (ref 70–99)
Glucose-Capillary: 123 mg/dL — ABNORMAL HIGH (ref 70–99)

## 2019-09-01 LAB — CBC
HCT: 39.3 % (ref 36.0–46.0)
HCT: 39.5 % (ref 36.0–46.0)
HCT: 38.6 % (ref 36.0–46.0)
Hemoglobin: 12.9 g/dL (ref 12.0–15.0)
Hemoglobin: 12.8 g/dL (ref 12.0–15.0)
Hemoglobin: 12.5 g/dL (ref 12.0–15.0)
MCH: 29 pg (ref 26.0–34.0)
MCH: 28.6 pg (ref 26.0–34.0)
MCH: 28.4 pg (ref 26.0–34.0)
MCHC: 32.8 g/dL (ref 30.0–36.0)
MCHC: 32.4 g/dL (ref 30.0–36.0)
MCHC: 32.4 g/dL (ref 30.0–36.0)
MCV: 88.3 fL (ref 80.0–100.0)
MCV: 88.4 fL (ref 80.0–100.0)
MCV: 87.7 fL (ref 80.0–100.0)
Platelets: 263 10*3/uL (ref 150–400)
Platelets: 256 10*3/uL (ref 150–400)
Platelets: 249 10*3/uL (ref 150–400)
RBC: 4.45 MIL/uL (ref 3.87–5.11)
RBC: 4.47 MIL/uL (ref 3.87–5.11)
RBC: 4.4 MIL/uL (ref 3.87–5.11)
RDW: 14.3 % (ref 11.5–15.5)
RDW: 14.3 % (ref 11.5–15.5)
RDW: 14.3 % (ref 11.5–15.5)
WBC: 5.7 10*3/uL (ref 4.0–10.5)
WBC: 5.4 10*3/uL (ref 4.0–10.5)
WBC: 4.9 10*3/uL (ref 4.0–10.5)
nRBC: 0 % (ref 0.0–0.2)
nRBC: 0 % (ref 0.0–0.2)
nRBC: 0 % (ref 0.0–0.2)

## 2019-09-01 LAB — TYPE AND SCREEN
ABO/RH(D): A POS
Antibody Screen: NEGATIVE

## 2019-09-01 LAB — ABO/RH: ABO/RH(D): A POS

## 2019-09-01 LAB — HIV ANTIBODY (ROUTINE TESTING W REFLEX): HIV Screen 4th Generation wRfx: NONREACTIVE

## 2019-09-01 LAB — MRSA PCR SCREENING: MRSA by PCR: POSITIVE — AB

## 2019-09-01 LAB — SARS CORONAVIRUS 2 (TAT 6-24 HRS): SARS Coronavirus 2: NEGATIVE

## 2019-09-01 MED ORDER — ALPRAZOLAM 0.5 MG PO TABS
1.0000 mg | ORAL_TABLET | Freq: Four times a day (QID) | ORAL | Status: DC
  Administered 2019-09-01 – 2019-09-02 (×6): 1 mg via ORAL
  Filled 2019-09-01 (×6): qty 2

## 2019-09-01 MED ORDER — MAGNESIUM OXIDE 400 (241.3 MG) MG PO TABS
400.0000 mg | ORAL_TABLET | Freq: Every day | ORAL | Status: DC
  Administered 2019-09-01 – 2019-09-02 (×2): 400 mg via ORAL
  Filled 2019-09-01 (×2): qty 1

## 2019-09-01 MED ORDER — SODIUM CHLORIDE 0.9 % IV SOLN: INTRAVENOUS | Status: AC

## 2019-09-01 MED ORDER — ONDANSETRON HCL 4 MG/2ML IJ SOLN: 4.0000 mg | Freq: Four times a day (QID) | INTRAMUSCULAR | Status: DC | PRN

## 2019-09-01 MED ORDER — LEVOTHYROXINE SODIUM 112 MCG PO TABS
112.0000 ug | ORAL_TABLET | Freq: Every day | ORAL | Status: DC
  Administered 2019-09-01: 07:00:00 112 ug via ORAL
  Filled 2019-09-01: qty 1

## 2019-09-01 MED ORDER — PEG-KCL-NACL-NASULF-NA ASC-C 100 G PO SOLR: 1.0000 | Freq: Once | ORAL | Status: DC

## 2019-09-01 MED ORDER — ACETAMINOPHEN 650 MG RE SUPP
650.0000 mg | Freq: Four times a day (QID) | RECTAL | Status: DC | PRN
  Filled 2019-09-01: qty 1

## 2019-09-01 MED ORDER — ACETAMINOPHEN 325 MG PO TABS
650.0000 mg | ORAL_TABLET | Freq: Four times a day (QID) | ORAL | Status: DC | PRN
  Administered 2019-09-01 – 2019-09-02 (×2): 650 mg via ORAL
  Filled 2019-09-01 (×2): qty 2

## 2019-09-01 MED ORDER — QUINAPRIL HCL 10 MG PO TABS
20.0000 mg | ORAL_TABLET | Freq: Two times a day (BID) | ORAL | Status: DC
  Administered 2019-09-01 – 2019-09-02 (×3): 20 mg via ORAL
  Filled 2019-09-01 (×4): qty 2

## 2019-09-01 MED ORDER — ALBUTEROL SULFATE (2.5 MG/3ML) 0.083% IN NEBU: 3.0000 mL | INHALATION_SOLUTION | RESPIRATORY_TRACT | Status: DC | PRN

## 2019-09-01 MED ORDER — TIZANIDINE HCL 4 MG PO TABS
4.0000 mg | ORAL_TABLET | Freq: Three times a day (TID) | ORAL | Status: DC
  Administered 2019-09-01 – 2019-09-02 (×5): 4 mg via ORAL
  Filled 2019-09-01 (×5): qty 1

## 2019-09-01 MED ORDER — MUPIROCIN 2 % EX OINT
TOPICAL_OINTMENT | Freq: Two times a day (BID) | CUTANEOUS | Status: DC
  Administered 2019-09-01 – 2019-09-02 (×2): via NASAL
  Filled 2019-09-01: qty 22

## 2019-09-01 MED ORDER — GABAPENTIN 600 MG PO TABS
600.0000 mg | ORAL_TABLET | Freq: Three times a day (TID) | ORAL | Status: DC
  Administered 2019-09-01 (×2): 600 mg via ORAL
  Filled 2019-09-01 (×3): qty 1

## 2019-09-01 MED ORDER — PEG 3350-KCL-NABCB-NACL-NASULF 236 G PO SOLR
4000.0000 mL | Freq: Once | ORAL | Status: AC
  Administered 2019-09-01: 17:00:00 4000 mL via ORAL
  Filled 2019-09-01: qty 4000

## 2019-09-01 MED ORDER — PEG 3350-KCL-NABCB-NACL-NASULF 236 G PO SOLR
4000.0000 mL | Freq: Once | ORAL | Status: AC
  Administered 2019-09-01: 22:00:00 4000 mL via ORAL
  Filled 2019-09-01: qty 4000

## 2019-09-01 MED ORDER — AMPHETAMINE-DEXTROAMPHETAMINE 10 MG PO TABS
30.0000 mg | ORAL_TABLET | Freq: Three times a day (TID) | ORAL | Status: DC
  Filled 2019-09-01 (×2): qty 3

## 2019-09-01 MED ORDER — FLECAINIDE ACETATE 50 MG PO TABS
50.0000 mg | ORAL_TABLET | Freq: Two times a day (BID) | ORAL | Status: DC
  Administered 2019-09-01 – 2019-09-02 (×3): 50 mg via ORAL
  Filled 2019-09-01 (×3): qty 1

## 2019-09-01 MED ORDER — ZOLPIDEM TARTRATE 5 MG PO TABS
5.0000 mg | ORAL_TABLET | Freq: Every day | ORAL | Status: DC
  Administered 2019-09-01: 5 mg via ORAL
  Filled 2019-09-01: qty 1

## 2019-09-01 MED ORDER — PANTOPRAZOLE SODIUM 40 MG PO TBEC
40.0000 mg | DELAYED_RELEASE_TABLET | Freq: Every day | ORAL | Status: DC
  Administered 2019-09-01 – 2019-09-02 (×2): 40 mg via ORAL
  Filled 2019-09-01 (×2): qty 1

## 2019-09-01 MED ORDER — ONDANSETRON HCL 4 MG PO TABS: 4.0000 mg | ORAL_TABLET | Freq: Four times a day (QID) | ORAL | Status: DC | PRN

## 2019-09-01 MED ORDER — CHLORHEXIDINE GLUCONATE CLOTH 2 % EX PADS
6.0000 | MEDICATED_PAD | Freq: Every day | CUTANEOUS | Status: DC
  Administered 2019-09-01: 22:00:00 6 via TOPICAL

## 2019-09-01 MED ORDER — FLUTICASONE PROPIONATE 50 MCG/ACT NA SUSP
2.0000 | Freq: Every day | NASAL | Status: DC
  Administered 2019-09-01 – 2019-09-02 (×2): 2 via NASAL
  Filled 2019-09-01: qty 16
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
## 2019-09-01 NOTE — Progress Notes (Signed)
pMN admission for GIB. See H&P for details. Consulted GI. Appreciate their assistance. Records request ordered. Trend H&H. Currently stable. Continue PPI. Otherwise, continue as per H&P.   Acute GIB Hx of gastric ulcer, Barrett's esophagus Hypothyroidism HTN Frequent PVCs Hx of fibromyalgia Hx of anxiety Hx of ADHD  General: 53 y.o. female resting in bed in NAD Cardiovascular: RRR, +S1, S2, no m/g/r, equal pulses throughout Respiratory: CTABL, no w/r/r, normal WOB GI: BS+, ND, TTP RUQ/RLQ, no masses noted, no organomegaly noted MSK: No e/c/c Skin: No rashes, bruises, ulcerations noted Neuro: A&O x 3, no focal deficits Psyc: Appropriate interaction and affect, calm/cooperative  .Jonnie Finner, DO
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
## 2019-09-01 NOTE — Progress Notes (Addendum)
CRITICAL VALUE ALERT  Critical Value:  MRSA PCR positive  Date & Time Notied:  09/01/2019; 07:33  Provider Notified: Dr. Marylyn Ishihara  Orders Received/Actions taken: see [facility]

## 2019-09-02 ENCOUNTER — Encounter (HOSPITAL_COMMUNITY): Payer: Self-pay | Admitting: *Deleted

## 2019-09-02 ENCOUNTER — Encounter (HOSPITAL_COMMUNITY)
Admission: AD | Disposition: A | Discharge status: Home / Self Care | Payer: Self-pay | Source: Other Acute Inpatient Hospital | Attending: Internal Medicine

## 2019-09-02 ENCOUNTER — Inpatient Hospital Stay (HOSPITAL_COMMUNITY): Payer: Medicare Other | Admitting: Certified Registered"

## 2019-09-02 ENCOUNTER — Inpatient Hospital Stay (HOSPITAL_COMMUNITY): Payer: Medicare Other

## 2019-09-02 DIAGNOSIS — R339 Retention of urine, unspecified: Secondary | ICD-10-CM | POA: Diagnosis not present

## 2019-09-02 DIAGNOSIS — R109 Unspecified abdominal pain: Secondary | ICD-10-CM | POA: Diagnosis not present

## 2019-09-02 DIAGNOSIS — I7300 Raynaud's syndrome without gangrene: Secondary | ICD-10-CM | POA: Diagnosis not present

## 2019-09-02 DIAGNOSIS — E039 Hypothyroidism, unspecified: Secondary | ICD-10-CM | POA: Diagnosis not present

## 2019-09-02 DIAGNOSIS — K644 Residual hemorrhoidal skin tags: Secondary | ICD-10-CM | POA: Diagnosis not present

## 2019-09-02 DIAGNOSIS — E782 Mixed hyperlipidemia: Secondary | ICD-10-CM | POA: Diagnosis not present

## 2019-09-02 DIAGNOSIS — K589 Irritable bowel syndrome without diarrhea: Secondary | ICD-10-CM | POA: Diagnosis not present

## 2019-09-02 DIAGNOSIS — K625 Hemorrhage of anus and rectum: Secondary | ICD-10-CM | POA: Diagnosis not present

## 2019-09-02 DIAGNOSIS — K921 Melena: Secondary | ICD-10-CM | POA: Diagnosis not present

## 2019-09-02 DIAGNOSIS — Z20828 Contact with and (suspected) exposure to other viral communicable diseases: Secondary | ICD-10-CM | POA: Diagnosis not present

## 2019-09-02 DIAGNOSIS — G4730 Sleep apnea, unspecified: Secondary | ICD-10-CM | POA: Diagnosis not present

## 2019-09-02 DIAGNOSIS — I129 Hypertensive chronic kidney disease with stage 1 through stage 4 chronic kidney disease, or unspecified chronic kidney disease: Secondary | ICD-10-CM | POA: Diagnosis not present

## 2019-09-02 DIAGNOSIS — R1030 Lower abdominal pain, unspecified: Secondary | ICD-10-CM | POA: Diagnosis not present

## 2019-09-02 DIAGNOSIS — K641 Second degree hemorrhoids: Secondary | ICD-10-CM | POA: Diagnosis not present

## 2019-09-02 DIAGNOSIS — I493 Ventricular premature depolarization: Secondary | ICD-10-CM | POA: Diagnosis not present

## 2019-09-02 DIAGNOSIS — K228 Other specified diseases of esophagus: Secondary | ICD-10-CM | POA: Diagnosis not present

## 2019-09-02 DIAGNOSIS — J449 Chronic obstructive pulmonary disease, unspecified: Secondary | ICD-10-CM | POA: Diagnosis not present

## 2019-09-02 DIAGNOSIS — N2889 Other specified disorders of kidney and ureter: Secondary | ICD-10-CM | POA: Diagnosis not present

## 2019-09-02 DIAGNOSIS — M1990 Unspecified osteoarthritis, unspecified site: Secondary | ICD-10-CM | POA: Diagnosis not present

## 2019-09-02 DIAGNOSIS — K2270 Barrett's esophagus without dysplasia: Secondary | ICD-10-CM | POA: Diagnosis not present

## 2019-09-02 DIAGNOSIS — K6389 Other specified diseases of intestine: Secondary | ICD-10-CM | POA: Diagnosis not present

## 2019-09-02 DIAGNOSIS — K219 Gastro-esophageal reflux disease without esophagitis: Secondary | ICD-10-CM | POA: Diagnosis not present

## 2019-09-02 DIAGNOSIS — Z87442 Personal history of urinary calculi: Secondary | ICD-10-CM | POA: Diagnosis not present

## 2019-09-02 DIAGNOSIS — K6289 Other specified diseases of anus and rectum: Secondary | ICD-10-CM | POA: Diagnosis not present

## 2019-09-02 DIAGNOSIS — Z87891 Personal history of nicotine dependence: Secondary | ICD-10-CM | POA: Diagnosis not present

## 2019-09-02 DIAGNOSIS — N189 Chronic kidney disease, unspecified: Secondary | ICD-10-CM | POA: Diagnosis not present

## 2019-09-02 DIAGNOSIS — K922 Gastrointestinal hemorrhage, unspecified: Secondary | ICD-10-CM | POA: Diagnosis not present

## 2019-09-02 DIAGNOSIS — M797 Fibromyalgia: Secondary | ICD-10-CM | POA: Diagnosis not present

## 2019-09-02 HISTORY — PX: BIOPSY: SHX5522

## 2019-09-02 HISTORY — PX: COLONOSCOPY WITH PROPOFOL: SHX5780

## 2019-09-02 HISTORY — PX: ESOPHAGOGASTRODUODENOSCOPY (EGD) WITH PROPOFOL: SHX5813

## 2019-09-02 LAB — RENAL FUNCTION PANEL
Albumin: 3.1 g/dL — ABNORMAL LOW (ref 3.5–5.0)
Anion gap: 7 (ref 5–15)
BUN: 10 mg/dL (ref 6–20)
CO2: 25 mmol/L (ref 22–32)
Calcium: 8.8 mg/dL — ABNORMAL LOW (ref 8.9–10.3)
Chloride: 110 mmol/L (ref 98–111)
Creatinine, Ser: 0.65 mg/dL (ref 0.44–1.00)
GFR calc Af Amer: >60 mL/min (ref >60)
GFR calc non Af Amer: >60 mL/min (ref >60)
Glucose, Bld: 101 mg/dL — ABNORMAL HIGH (ref 70–99)
Phosphorus: 2.9 mg/dL (ref 2.5–4.6)
Potassium: 3.7 mmol/L (ref 3.5–5.1)
Sodium: 142 mmol/L (ref 135–145)

## 2019-09-02 LAB — CBC WITH DIFFERENTIAL/PLATELET
Abs Immature Granulocytes: 0.01 10*3/uL (ref 0.00–0.07)
Basophils Absolute: 0 10*3/uL (ref 0.0–0.1)
Basophils Relative: 1 %
Eosinophils Absolute: 0.2 10*3/uL (ref 0.0–0.5)
Eosinophils Relative: 3 %
HCT: 36.6 % (ref 36.0–46.0)
Hemoglobin: 11.9 g/dL — ABNORMAL LOW (ref 12.0–15.0)
Immature Granulocytes: 0 %
Lymphocytes Relative: 46 %
Lymphs Abs: 2.7 10*3/uL (ref 0.7–4.0)
MCH: 28.6 pg (ref 26.0–34.0)
MCHC: 32.5 g/dL (ref 30.0–36.0)
MCV: 88 fL (ref 80.0–100.0)
Monocytes Absolute: 0.5 10*3/uL (ref 0.1–1.0)
Monocytes Relative: 9 %
Neutro Abs: 2.5 10*3/uL (ref 1.7–7.7)
Neutrophils Relative %: 41 %
Platelets: 249 10*3/uL (ref 150–400)
RBC: 4.16 MIL/uL (ref 3.87–5.11)
RDW: 14.1 % (ref 11.5–15.5)
WBC: 5.9 10*3/uL (ref 4.0–10.5)
nRBC: 0 % (ref 0.0–0.2)

## 2019-09-02 LAB — GLUCOSE, CAPILLARY
Glucose-Capillary: 92 mg/dL (ref 70–99)
Glucose-Capillary: 100 mg/dL — ABNORMAL HIGH (ref 70–99)

## 2019-09-02 LAB — MAGNESIUM: Magnesium: 1.8 mg/dL (ref 1.7–2.4)

## 2019-09-02 SURGERY — ESOPHAGOGASTRODUODENOSCOPY (EGD) WITH PROPOFOL
Anesthesia: Monitor Anesthesia Care

## 2019-09-02 MED ORDER — LIDOCAINE 2% (20 MG/ML) 5 ML SYRINGE
INTRAMUSCULAR | Status: DC | PRN
  Administered 2019-09-02: 50 mg via INTRAVENOUS

## 2019-09-02 MED ORDER — TAMSULOSIN HCL 0.4 MG PO CAPS: 0.4000 mg | ORAL_CAPSULE | Freq: Every day | ORAL | 0 refills | Status: AC

## 2019-09-02 MED ORDER — BUTAMBEN-TETRACAINE-BENZOCAINE 2-2-14 % EX AERO
INHALATION_SPRAY | CUTANEOUS | Status: DC | PRN
  Administered 2019-09-02: 2 via TOPICAL

## 2019-09-02 MED ORDER — PROPOFOL 500 MG/50ML IV EMUL
INTRAVENOUS | Status: DC | PRN
  Administered 2019-09-02 (×2): 50 ug/kg/min via INTRAVENOUS

## 2019-09-02 MED ORDER — LACTATED RINGERS IV SOLN
INTRAVENOUS | Status: DC
  Administered 2019-09-02 (×2): via INTRAVENOUS

## 2019-09-02 SURGICAL SUPPLY — 25 items

## 2019-09-02 NOTE — Care Management Obs Status (Signed)
[place] NOTIFICATION   Patient Details  Name: Laura [name] MRN: [id number] Date of Birth: 1966-02-17   Medicare Observation Status Notification Given:  Yes    Carles Collet, RN 09/02/2019, 3:56 PM

## 2019-09-02 NOTE — Anesthesia Postprocedure Evaluation (Signed)
Anesthesia Post Note  Patient: [name]  Procedure(s) Performed: ESOPHAGOGASTRODUODENOSCOPY (EGD) WITH PROPOFOL (N/A ) COLONOSCOPY WITH PROPOFOL (N/A )     Patient location during evaluation: PACU Anesthesia Type: MAC Level of consciousness: awake and alert Pain management: pain level controlled Vital Signs Assessment: post-procedure vital signs reviewed and stable Respiratory status: spontaneous breathing, nonlabored ventilation, respiratory function stable and patient connected to nasal cannula oxygen Cardiovascular status: stable and blood pressure returned to baseline Postop Assessment: no apparent nausea or vomiting Anesthetic complications: no    Last Vitals:  Vitals:   09/02/19 1145 09/02/19 1211  BP: 109/71 116/82  Pulse: 63 64  Resp: 20 20  Temp:  36.5 C  SpO2: 100% 100%    Last Pain:  Vitals:   09/02/19 1217  TempSrc:   PainSc: 5                  Syra Sirmons DAVID

## 2019-09-02 NOTE — Op Note (Signed)
[facility] Patient Name: Laura [name] Procedure Date : 09/02/2019 MRN: [id number] Attending MD: Justice Britain , MD Date of Birth: 1966/09/26 CSN: [id number] Age: 53 Admit Type: Inpatient Procedure:                Upper GI endoscopy Indications:              Lower abdominal pain, History of PUD Providers:                Justice Britain, MD, Elmer Ramp. Tilden Dome, RN, Lazaro Arms, Technician Referring MD:             Gatha Mayer, MD, [facility] Medicines:                Monitored Anesthesia Care Complications:            No immediate complications. Estimated Blood Loss:     Estimated blood loss: none. Procedure:                Pre-Anesthesia Assessment:                           - Prior to the procedure, a History and Physical                            was performed, and patient medications and                            allergies were reviewed. The patient's tolerance of                            previous anesthesia was also reviewed. The risks                            and benefits of the procedure and the sedation                            options and risks were discussed with the patient.                            All questions were answered, and informed consent                            was obtained. Prior Anticoagulants: The patient has                            taken no previous anticoagulant or antiplatelet                            agents. ASA Grade Assessment: II - A patient with                            mild systemic disease. After reviewing the risks  and benefits, the patient was deemed in                            satisfactory condition to undergo the procedure.                           After obtaining informed consent, the endoscope was                            passed under direct vision. Throughout the                            procedure, the patient's blood pressure, pulse,  and                            oxygen saturations were monitored continuously. The                            [id number]) Olympus gastroscope was                            introduced through the mouth, and advanced to the                            second part of duodenum. The upper GI endoscopy was                            accomplished without difficulty. The patient                            tolerated the procedure. Scope In: Scope Out: Findings:      No gross lesions were noted in the entire esophagus.      The Z-line was irregular and was found 39 cm from the incisors.      No gross lesions were noted in the entire examined stomach.      No gross lesions were noted in the duodenal bulb, in the first portion       of the duodenum and in the second portion of the duodenum. Impression:               - No gross lesions in esophagus.                           - Z-line irregular, 39 cm from the incisors.                           - No gross lesions in the stomach.                           - No gross lesions in the duodenal bulb, in the                            first portion of the duodenum and in the second  portion of the duodenum.                           - No specimens collected. Recommendation:           - Proceed to scheduled colonoscopy.                           - Continue current medications.                           - The findings and recommendations were discussed                            with the patient.                           - The findings and recommendations were discussed                            with the referring physician. Procedure Code(s):        --- Professional ---                           [id number], Esophagogastroduodenoscopy, flexible,                            transoral; diagnostic, including collection of                            specimen(s) by brushing or washing, when performed                             (separate procedure) Diagnosis Code(s):        --- Professional ---                           K22.8, Other specified diseases of esophagus                           R10.30, Lower abdominal pain, unspecified CPT copyright 2019 American Medical Association. All rights reserved. The codes documented in this report are preliminary and upon coder review may  be revised to meet current compliance requirements. Justice Britain, MD 09/02/2019 11:32:44 AM Number of Addenda: 0

## 2019-09-02 NOTE — Progress Notes (Signed)
Patient was discharged home by MD order; discharged instructions  review and give to patient with care notes; IV DIC; skin intact; patient will be escorted to the car by nurse tech via wheelchair.  

## 2019-09-02 NOTE — Anesthesia Preprocedure Evaluation (Signed)
Anesthesia Evaluation  Patient identified by MRN, date of birth, ID band Patient awake    Reviewed: Allergy & Precautions, NPO status , Patient's Chart, lab work & pertinent test results  Airway Mallampati: I  TM Distance: >3 FB Neck ROM: Full    Dental   Pulmonary sleep apnea , COPD, former smoker,    Pulmonary exam normal        Cardiovascular hypertension, Pt. on medications Normal cardiovascular exam     Neuro/Psych Anxiety Depression    GI/Hepatic GERD  Medicated and Controlled,  Endo/Other    Renal/GU      Musculoskeletal   Abdominal   Peds  Hematology   Anesthesia Other Findings   Reproductive/Obstetrics                             Anesthesia Physical Anesthesia Plan  ASA: II  Anesthesia Plan: MAC   Post-op Pain Management:    Induction: Intravenous  PONV Risk Score and Plan: 2 and Treatment may vary due to age or medical condition  Airway Management Planned: Simple Face Mask  Additional Equipment:   Intra-op Plan:   Post-operative Plan:   Informed Consent: I have reviewed the patients History and Physical, chart, labs and discussed the procedure including the risks, benefits and alternatives for the proposed anesthesia with the patient or authorized representative who has indicated his/her understanding and acceptance.       Plan Discussed with: CRNA and Surgeon  Anesthesia Plan Comments:         Anesthesia Quick Evaluation

## 2019-09-02 NOTE — Op Note (Signed)
[facility] Patient Name: Laura [name] Procedure Date : 09/02/2019 MRN: [id number] Attending MD: Justice Britain , MD Date of Birth: 1966-01-27 CSN: [id number] Age: 54 Admit Type: Inpatient Procedure:                Colonoscopy Indications:              Lower abdominal pain, Hematochezia Providers:                Justice Britain, MD, Elmer Ramp. Tilden Dome, RN, Lazaro Arms, Technician Referring MD:             Gatha Mayer, MD, [facility] Medicines:                Monitored Anesthesia Care Complications:            No immediate complications. Estimated Blood Loss:     Estimated blood loss was minimal. Procedure:                Pre-Anesthesia Assessment:                           - Prior to the procedure, a History and Physical                            was performed, and patient medications and                            allergies were reviewed. The patient's tolerance of                            previous anesthesia was also reviewed. The risks                            and benefits of the procedure and the sedation                            options and risks were discussed with the patient.                            All questions were answered, and informed consent                            was obtained. Prior Anticoagulants: The patient has                            taken no previous anticoagulant or antiplatelet                            agents. ASA Grade Assessment: II - A patient with                            mild systemic disease. After reviewing the risks  and benefits, the patient was deemed in                            satisfactory condition to undergo the procedure.                           After obtaining informed consent, the colonoscope                            was passed under direct vision. Throughout the                            procedure, the patient's blood pressure, pulse,  and                            oxygen saturations were monitored continuously. The                            CF-HQ190L ([id number]) Olympus colonoscope was                            introduced through the anus and advanced to the the                            sigmoid colon. The [id number]-[id number] ([id number]) Olympus                            pediatric colonscope was introduced through the                            anus and advanced to the 5 cm into the ileum. The                            colonoscopy was somewhat difficult due to                            restricted mobility of the colon. Successful                            completion of the procedure was aided by changing                            the patient's position, using manual pressure,                            withdrawing and reinserting the scope, withdrawing                            the scope and replacing with the pediatric                            colonoscope, straightening and shortening the scope  to obtain bowel loop reduction and using scope                            torsion. The patient tolerated the procedure. Scope In: 10:49:21 AM Scope Out: 11:14:51 AM Scope Withdrawal Time: 0 hours 11 minutes 32 seconds  Total Procedure Duration: 0 hours 25 minutes 30 seconds  Findings:      The digital rectal exam findings include hemorrhoids. Pertinent       negatives include no palpable rectal lesions.      The terminal ileum and ileocecal valve appeared normal.      Normal mucosa was found at the splenic flexure, in the transverse colon,       at the hepatic flexure, in the ascending colon and in the cecum.      Localized mild mucosal changes characterized by congestion (edema),       erythema and granularity were found in the proximal sigmoid colon, in       the mid descending colon and in the distal descending colon. Biopsies       were taken with a cold forceps for histology to rule out chronic  colitis.      Normal mucosa was found in the rectum, in the recto-sigmoid colon, in       the mid sigmoid colon and in the distal sigmoid colon.      Anal papilla(e) were hypertrophied.      Non-bleeding non-thrombosed external and internal hemorrhoids were found       during retroflexion, during perianal exam and during digital exam. The       hemorrhoids were Grade II (internal hemorrhoids that prolapse but reduce       spontaneously). Impression:               - Hemorrhoids found on digital rectal exam.                           - The examined portion of the ileum was normal.                           - Normal mucosa at the splenic flexure, in the                            transverse colon, at the hepatic flexure, in the                            ascending colon and in the cecum.                           - Localized mild mucosal changes were found in the                            proximal sigmoid colon, in the mid descending colon                            and in the distal descending colon. Biopsied.                           - Normal mucosa in the rectum,  in the recto-sigmoid                            colon, in the mid sigmoid colon and in the distal                            sigmoid colon.                           - Anal papilla(e) were hypertrophied.                           - Non-bleeding non-thrombosed external and internal                            hemorrhoids.                           - Query rectal bleeding was hemorrhoidal, or if the                            area in the [place] returns as abnormal or repairing,                            could she have had some ischemia (it would be in a                            distribution to consider) though reported prior CT                            was negative for any changes (did not see images                            but only read report). Recommendation:           - The patient will be observed post-procedure,                             until all discharge criteria are met.                           - Return patient to hospital ward for ongoing care.                           - Use FiberCon 1 tablet PO daily.                           - Await pathology results.                           - Repeat colonoscopy in 10 years for screening                            purposes as she did not have polyps on her 2017  Colonoscopy either.                           - GI will sign off and follow up pathology and get                            outpatient follow up with primary GI in the coming                            weeks.                           - The findings and recommendations were discussed                            with the patient.                           - The findings and recommendations were discussed                            with the referring physician. Procedure Code(s):        --- Professional ---                           [id number], Colonoscopy, flexible; with biopsy, single                            or multiple Diagnosis Code(s):        --- Professional ---                           K64.1, Second degree hemorrhoids                           K63.89, Other specified diseases of intestine                           K62.89, Other specified diseases of anus and rectum                           R10.30, Lower abdominal pain, unspecified                           K92.1, Melena (includes Hematochezia) CPT copyright 2019 American Medical Association. All rights reserved. The codes documented in this report are preliminary and upon coder review may  be revised to meet current compliance requirements. Justice Britain, MD 09/02/2019 11:42:37 AM Number of Addenda: 0

## 2019-09-02 NOTE — Discharge Summary (Signed)
. Physician Discharge Summary  Laura [name] [id number] DOB: December 03, 1965 DOA: 09/01/2019  PCP: Greig Right, MD  Admit date: 09/01/2019 Discharge date: 09/02/2019  Admitted From: Home Disposition:  Discharged to home.  Recommendations for Outpatient Follow-up:  1. Follow up with PCP in 1-2 weeks 2. Please obtain BMP/CBC in one week  Discharge Condition: Stable  CODE STATUS: FULL   Brief/Interim Summary: Laura I Atkinsis a 53 y.o.femalewithhistory of PVC, hypertension, sleep apnea, hyperlipidemia, fibromyalgia presented to the ER at [facility] with abdominal pain and rectal bleeding. Patient states she had at least 5 episodes of frank rectal bleeding with lower abdominal pain on Thursday 3 days ago. After reaching ER patient had further 3 more episodes. Abdomen was tender on exam in the lower quadrants. Patient states she has not had any colonoscopy previously and has not had a GI bleed previously. Denies taking any NSAIDs. Patient not on any aspirin. In the ER patient's labs showed hemoglobin was at baseline and a CT abdomen pelvis did not show any acute. Good that patient may need further GI work-up patient was transferred to [facility]. At the time of my exam patient is hemodynamically stable abdomen appears benign at this time. Has not had any further GI bleed after admission at the ER in [facility].  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 Localized mild mucosal changes characterized by congestion (edema), erythema and granularity were found in the proximal sigmoid colon, in the mid descending colon and in the distal  descending colon.Biopsies were taken with a cold forceps for histology to rule out chronic colitis.   Hx of gastric ulcer, Barrett's esophagus     - protonix  Hypothyroidism     - synthroid  HTN     - quinapril  Frequent PVCs     - flecanide  Hx of fibromyalgia     - gabapentin, tizanidine  Hx of anxiety     - xanax  Hx of ADHD     - adderall  Urinary hesitation/retention     - flomax     - outpt urology follow up  Discharge Instructions   Allergies as of 09/02/2019      Reactions   Meperidine Hives   Shellfish Allergy Shortness Of Breath, Swelling   Diltiazem Swelling   Acebutolol Swelling   Amlodipine    Swelling   Codeine Hives, Nausea And Vomiting   Cymbalta [duloxetine Hcl] Other (See Comments)   Made pt feel crazy   Dexilant [dexlansoprazole] Other (See Comments)   Abdominal pain   Hydrocodone Other (See Comments)   Keeps patient awake.   Losartan    Swelling   Metoprolol Swelling   Mirtazapine Swelling   Omeprazole    Abdominal pain   Oxycodone Itching   Sectral [acebutolol Hcl] Swelling   Tramadol    Unable to sleep, makes her itch      Medication List    TAKE these medications   albuterol 108 (90 Base) MCG/ACT inhaler Commonly known as: VENTOLIN HFA Inhale 2 puffs into the lungs every 4 (four) hours as needed for wheezing or shortness of breath.   ALPRAZolam 1 MG tablet Commonly known as: Duanne Moron  Take 1 mg by mouth 4 (four) times daily.   amphetamine-dextroamphetamine 30 MG tablet Commonly known as: ADDERALL Take 30 mg by mouth 3 (three) times daily.   diclofenac sodium 1 % Gel Commonly known as: VOLTAREN Apply 2-4 g topically 2 (two) times daily as needed. To knees What changed:   reasons to take this  additional instructions   flecainide 50 MG tablet Commonly known as: TAMBOCOR Take 1 tablet (50 mg total) by mouth 2 (two) times daily.   fluticasone 50 MCG/ACT nasal spray Commonly known as: FLONASE Place 2 sprays into  both nostrils daily.   gabapentin 600 MG tablet Commonly known as: NEURONTIN Take 600 mg by mouth 3 (three) times daily as needed (pain).   levothyroxine 112 MCG tablet Commonly known as: SYNTHROID Take 112 mcg by mouth daily.   magnesium oxide 400 MG tablet Commonly known as: MAG-OX Take 1 tablet (400 mg total) by mouth daily.   pantoprazole 40 MG tablet Commonly known as: PROTONIX Take 1 tablet (40 mg total) by mouth daily. What changed: when to take this   quinapril 20 MG tablet Commonly known as: ACCUPRIL Take 1 tablet (20 mg total) by mouth 2 (two) times a day.   Repatha SureClick [id number] MG/ML Soaj Generic drug: Evolocumab Inject 1 Dose into the skin every 14 (fourteen) days.   tamsulosin 0.4 MG Caps capsule Commonly known as: FLOMAX Take 1 capsule (0.4 mg total) by mouth daily.   tiZANidine 4 MG tablet Commonly known as: ZANAFLEX Take 4 mg by mouth 3 (three) times daily.   torsemide 100 MG tablet Commonly known as: DEMADEX Take 100 mg by mouth daily.   zolpidem 10 MG tablet Commonly known as: AMBIEN Take 10 mg by mouth at bedtime.       Allergies  Allergen Reactions  . Meperidine Hives  . Shellfish Allergy Shortness Of Breath and Swelling  . Diltiazem Swelling  . Acebutolol Swelling  . Amlodipine     Swelling   . Codeine Hives and Nausea And Vomiting  . Cymbalta [Duloxetine Hcl] Other (See Comments)    Made pt feel crazy  . Dexilant [Dexlansoprazole] Other (See Comments)    Abdominal pain  . Hydrocodone Other (See Comments)    Keeps patient awake.  . Losartan     Swelling   . Metoprolol Swelling  . Mirtazapine Swelling  . Omeprazole     Abdominal pain  . Oxycodone Itching  . Sectral [Acebutolol Hcl] Swelling  . Tramadol     Unable to sleep, makes her itch    Consultations:  GI   Procedures/Studies:  No results found.   Subjective: "It's been that way for a long time."  Discharge Exam: Vitals:   09/02/19 1211 09/02/19 1354   BP: 116/82 (!) 143/82  Pulse: 64 (!) 57  Resp: 20 16  Temp: 97.7 F (36.5 C) 98.4 F (36.9 C)  SpO2: 100% 100%   Vitals:   09/02/19 1121 09/02/19 1145 09/02/19 1211 09/02/19 1354  BP: (!) 97/46 109/71 116/82 (!) 143/82  Pulse: 67 63 64 (!) 57  Resp: 14 20 20 16   Temp: (!) 96.7 F (35.9 C)  97.7 F (36.5 C) 98.4 F (36.9 C)  TempSrc: Temporal  Oral Oral  SpO2: 99% 100% 100% 100%  Weight:      Height:        General: 53 y.o. female resting in bed in NAD Cardiovascular: RRR, +S1, S2, no m/g/r, equal pulses throughout Respiratory: CTABL, no w/r/r, normal  WOB GI: BS+, ND, RLQ mild TTP, no masses noted, no organomegaly noted MSK: No e/c/c Skin: No rashes, bruises, ulcerations noted Neuro: A&O x 3, no focal deficits Psyc: Appropriate interaction and affect, calm/cooperative     The results of significant diagnostics from this hospitalization (including imaging, microbiology, ancillary and laboratory) are listed below for reference.     Microbiology: Recent Results (from the past 240 hour(s))  SARS CORONAVIRUS 2 (TAT 6-24 HRS) Nasopharyngeal Nasopharyngeal Swab     Status: None   Collection Time: 09/01/19  5:50 AM   Specimen: Nasopharyngeal Swab  Result Value Ref Range Status   SARS Coronavirus 2 NEGATIVE NEGATIVE Final    Comment: (NOTE) SARS-CoV-2 target nucleic acids are NOT DETECTED. The SARS-CoV-2 RNA is generally detectable in upper and lower respiratory specimens during the acute phase of infection. Negative results do not preclude SARS-CoV-2 infection, do not rule out co-infections with other pathogens, and should not be used as the sole basis for treatment or other patient management decisions. Negative results must be combined with clinical observations, patient history, and epidemiological information. The expected result is Negative. Fact Sheet for Patients: SugarRoll.be Fact Sheet for Healthcare  Providers: https://www.woods-mathews.com/ This test is not yet approved or cleared by the [place] FDA and  has been authorized for detection and/or diagnosis of SARS-CoV-2 by FDA under an Emergency Use Authorization (EUA). This EUA will remain  in effect (meaning this test can be used) for the duration of the COVID-19 declaration under Section 56 4(b)(1) of the Act, 21 U.S.C. section 360bbb-3(b)(1), unless the authorization is terminated or revoked sooner. Performed at [facility], [place] [address] [place], [place] [address]   MRSA PCR Screening     Status: Abnormal   Collection Time: 09/01/19  5:50 AM   Specimen: Nasal Mucosa; Nasopharyngeal  Result Value Ref Range Status   MRSA by PCR POSITIVE (A) NEGATIVE Final    Comment:        The GeneXpert MRSA Assay (FDA approved for NASAL specimens only), is one component of a comprehensive MRSA colonization surveillance program. It is not intended to diagnose MRSA infection nor to guide or monitor treatment for MRSA infections. RESULT CALLED TO, READ BACK BY AND VERIFIED WITH: RN Alphonsus Sias [phone] FCP Performed at [facility], [place] [address] [place], [place] [address]      Labs: BNP (last 3 results) No results for input(s): BNP in the last 8760 hours. Basic Metabolic Panel: Recent Labs  Lab 09/01/19 0447 09/02/19 0242  NA 140 142  K 3.9 3.7  CL 102 110  CO2 26 25  GLUCOSE 124* 101*  BUN 16 10  CREATININE 0.84 0.65  CALCIUM 9.3 8.8*  MG  --  1.8  PHOS  --  2.9   Liver Function Tests: Recent Labs  Lab 09/01/19 0447 09/02/19 0242  AST 19  --   ALT 26  --   ALKPHOS 64  --   BILITOT 0.6  --   PROT 5.9*  --   ALBUMIN 3.5 3.1*   No results for input(s): LIPASE, AMYLASE in the last 168 hours. No results for input(s): AMMONIA in the last 168 hours. CBC: Recent Labs  Lab 09/01/19 0447 09/01/19 0804 09/01/19 1215 09/02/19 0242  WBC 5.7 5.4 4.9 5.9  NEUTROABS  --   --   --   2.5  HGB 12.9 12.8 12.5 11.9*  HCT 39.3 39.5 38.6 36.6  MCV 88.3 88.4 87.7 88.0  PLT 263 256 249 249   Cardiac Enzymes: No results for input(s): CKTOTAL, CKMB, CKMBINDEX, TROPONINI in the last 168 hours. BNP: Invalid input(s): POCBNP CBG: Recent Labs  Lab 09/01/19 0828 09/01/19 1748 09/01/19 2202 09/02/19 0753  GLUCAP 99 103* 123* 92   D-Dimer No results for input(s): DDIMER in the last 72 hours. Hgb A1c No results for input(s): HGBA1C in the last 72 hours. Lipid Profile No results for input(s): CHOL, HDL, LDLCALC, TRIG, CHOLHDL, LDLDIRECT in the last 72 hours. Thyroid function studies No results for input(s): TSH, T4TOTAL, T3FREE, THYROIDAB in the last 72 hours.  Invalid input(s): FREET3 Anemia work up No results for input(s): VITAMINB12, FOLATE, FERRITIN, TIBC, IRON, RETICCTPCT in the last 72 hours. Urinalysis    Component Value Date/Time   COLORURINE STRAW (A) 12/08/2018 1620   APPEARANCEUR CLEAR (A) 12/08/2018 1620   LABSPEC 1.012 12/08/2018 1620   PHURINE 7.0 12/08/2018 1620   GLUCOSEU NEGATIVE 12/08/2018 1620   HGBUR NEGATIVE 12/08/2018 1620   BILIRUBINUR NEGATIVE 12/08/2018 1620   BILIRUBINUR Negative 12/05/2018 [place] 12/08/2018 1620   PROTEINUR NEGATIVE 12/08/2018 1620   UROBILINOGEN 0.2 12/05/2018 1437   NITRITE NEGATIVE 12/08/2018 1620   LEUKOCYTESUR NEGATIVE 12/08/2018 1620   Sepsis Labs Invalid input(s): PROCALCITONIN,  WBC,  LACTICIDVEN Microbiology Recent Results (from the past 240 hour(s))  SARS CORONAVIRUS 2 (TAT 6-24 HRS) Nasopharyngeal Nasopharyngeal Swab     Status: None   Collection Time: 09/01/19  5:50 AM   Specimen: Nasopharyngeal Swab  Result Value Ref Range Status   SARS Coronavirus 2 NEGATIVE NEGATIVE Final    Comment: (NOTE) SARS-CoV-2 target nucleic acids are NOT DETECTED. The SARS-CoV-2 RNA is generally detectable in upper and lower respiratory specimens during the acute phase of infection. Negative results  do not preclude SARS-CoV-2 infection, do not rule out co-infections with other pathogens, and should not be used as the sole basis for treatment or other patient management decisions. Negative results must be combined with clinical observations, patient history, and epidemiological information. The expected result is Negative. Fact Sheet for Patients: SugarRoll.be Fact Sheet for Healthcare Providers: https://www.woods-mathews.com/ This test is not yet approved or cleared by the [place] FDA and  has been authorized for detection and/or diagnosis of SARS-CoV-2 by FDA under an Emergency Use Authorization (EUA). This EUA will remain  in effect (meaning this test can be used) for the duration of the COVID-19 declaration under Section 56 4(b)(1) of the Act, 21 U.S.C. section 360bbb-3(b)(1), unless the authorization is terminated or revoked sooner. Performed at [facility], [place] [address], [place], [place] [address]   MRSA PCR Screening     Status: Abnormal   Collection Time: 09/01/19  5:50 AM   Specimen: Nasal Mucosa; Nasopharyngeal  Result Value Ref Range Status   MRSA by PCR POSITIVE (A) NEGATIVE Final    Comment:        The GeneXpert MRSA Assay (FDA approved for NASAL specimens only), is one component of a comprehensive MRSA colonization surveillance program. It is not intended to diagnose MRSA infection nor to guide or monitor treatment for MRSA infections. RESULT CALLED TO, READ BACK BY AND VERIFIED WITH: RN Alphonsus Sias [phone] FCP Performed at [facility], [place] [address], [place], [place] [address]      Time coordinating discharge: 35 minutes  SIGNED:   Jonnie Finner, DO  [facility] 09/02/2019, 2:46 PM Pager   If 7PM-7AM, please contact night-coverage [email or web address] Password [email or web address]

## 2019-09-02 NOTE — Care Management CC44 (Signed)
Condition Code 44 Documentation Completed  Patient Details  Name: Laura [name] MRN: [id number] Date of Birth: 1966-01-12   Condition Code 44 given:  Yes Patient signature on Condition Code 44 notice:  Yes Documentation of 2 MD's agreement:  Yes Code 44 added to claim:  Yes    Carles Collet, RN 09/02/2019, 3:56 PM

## 2019-09-02 NOTE — Transfer of Care (Signed)
Immediate Anesthesia Transfer of Care Note  Patient: Laura [name]  Procedure(s) Performed: ESOPHAGOGASTRODUODENOSCOPY (EGD) WITH PROPOFOL (N/A ) COLONOSCOPY WITH PROPOFOL (N/A )  Patient Location: PACU  Anesthesia Type:MAC  Level of Consciousness: awake, alert , oriented and patient cooperative  Airway & Oxygen Therapy: Patient Spontanous Breathing and Patient connected to nasal cannula oxygen  Post-op Assessment: Report given to RN and Post -op Vital signs reviewed and stable  Post vital signs: Reviewed and stable  Last Vitals:  Vitals Value Taken Time  BP 97/46 09/02/19 1120  Temp    Pulse 66 09/02/19 1121  Resp 22 09/02/19 1121  SpO2 98 % 09/02/19 1121  Vitals shown include unvalidated device data.  Last Pain:  Vitals:   09/02/19 1005  TempSrc: Temporal  PainSc: 5       Patients Stated Pain Goal: 3 (69/45/03 8882)  Complications: No apparent anesthesia complications

## 2019-09-04 ENCOUNTER — Other Ambulatory Visit: Payer: Self-pay | Admitting: Physician Assistant

## 2019-09-04 DIAGNOSIS — G4730 Sleep apnea, unspecified: Secondary | ICD-10-CM | POA: Diagnosis not present

## 2019-09-04 DIAGNOSIS — R0602 Shortness of breath: Secondary | ICD-10-CM | POA: Diagnosis not present

## 2019-09-04 LAB — SURGICAL PATHOLOGY

## 2019-09-05 ENCOUNTER — Encounter (HOSPITAL_COMMUNITY): Payer: Self-pay | Admitting: Gastroenterology

## 2019-09-07 ENCOUNTER — Encounter: Payer: Self-pay | Admitting: Gastroenterology

## 2019-09-07 LAB — METANEPHRINES, URINE, 24 HOUR
Metaneph Total, Ur: 23 ug/L
Metanephrines, 24H Ur: 87 ug/24 hr (ref 36–209)
Normetanephrine, 24H Ur: 232 ug/24 hr (ref 131–612)
Normetanephrine, Ur: 61 ug/L

## 2019-09-10 DIAGNOSIS — I10 Essential (primary) hypertension: Secondary | ICD-10-CM | POA: Diagnosis not present

## 2019-09-10 DIAGNOSIS — N261 Atrophy of kidney (terminal): Secondary | ICD-10-CM | POA: Diagnosis not present

## 2019-09-10 DIAGNOSIS — Z87442 Personal history of urinary calculi: Secondary | ICD-10-CM | POA: Diagnosis not present

## 2019-09-10 DIAGNOSIS — R300 Dysuria: Secondary | ICD-10-CM | POA: Diagnosis not present

## 2019-09-10 NOTE — Telephone Encounter (Signed)
lmov to schedule with Dr. Caryl Comes

## 2019-09-11 NOTE — Telephone Encounter (Signed)
Call to patient to discuss lab/urine result. She verbalized understanding.   Pt also was inquiring about f/u needed with Dr. Caryl Comes. Per AVS note 10/22, follow up in 8-10 weeks. Availability for [place] 11+ weeks out.  Pt agreeable to go to [facility] as it is not out of the way for her. Routing to [facility] scheduling pool to call patient for [employer].

## 2019-09-11 NOTE — Telephone Encounter (Signed)
-----   Message from Arvil Chaco, PA-C sent at 09/11/2019  1:22 PM EST ----- Please let Laura [name] know that her 24 hour urine metanephrines were within normal ranges, which is reassuring.

## 2019-09-13 DIAGNOSIS — R229 Localized swelling, mass and lump, unspecified: Secondary | ICD-10-CM | POA: Diagnosis not present

## 2019-09-13 DIAGNOSIS — R21 Rash and other nonspecific skin eruption: Secondary | ICD-10-CM | POA: Diagnosis not present

## 2019-09-13 NOTE — Telephone Encounter (Signed)
Scheduled appt in mychart msg.    Closing this encounter

## 2019-09-17 ENCOUNTER — Ambulatory Visit: Payer: Medicare Other | Admitting: Internal Medicine

## 2019-09-21 ENCOUNTER — Telehealth: Payer: Self-pay | Admitting: Internal Medicine

## 2019-09-21 MED ORDER — PROPAFENONE HCL ER 225 MG PO CP12: 225.0000 mg | ORAL_CAPSULE | Freq: Two times a day (BID) | ORAL | 1 refills | Status: DC

## 2019-09-21 NOTE — Telephone Encounter (Signed)
The patient had sent in an appointment request via [facility] on 09/13/19. Scheduling forwarded a message to me that the patient feels that her flecainide is not working. I discussed this with Dr. Caryl Comes. Per Dr. Caryl Comes: - stop flecainide - start propafenone 225 mg- take 1 tablet by mouth twice a day  I have called and spoken with the patient about this and she is agreeable with the above MD recommendations. She would like her RX sent to [facility].   She will follow up with Dr. Caryl Comes on 11/08/2019.

## 2019-10-01 ENCOUNTER — Telehealth: Payer: Self-pay | Admitting: *Deleted

## 2019-10-01 NOTE — Telephone Encounter (Signed)

## 2019-10-02 DIAGNOSIS — M1711 Unilateral primary osteoarthritis, right knee: Secondary | ICD-10-CM | POA: Diagnosis not present

## 2019-10-02 DIAGNOSIS — M25561 Pain in right knee: Secondary | ICD-10-CM | POA: Diagnosis not present

## 2019-10-02 DIAGNOSIS — U071 COVID-19: Secondary | ICD-10-CM

## 2019-10-02 DIAGNOSIS — E785 Hyperlipidemia, unspecified: Secondary | ICD-10-CM | POA: Diagnosis not present

## 2019-10-02 HISTORY — DX: COVID-19: U07.1

## 2019-10-03 ENCOUNTER — Telehealth: Payer: Self-pay | Admitting: *Deleted

## 2019-10-03 ENCOUNTER — Other Ambulatory Visit: Payer: Self-pay

## 2019-10-03 ENCOUNTER — Telehealth (INDEPENDENT_AMBULATORY_CARE_PROVIDER_SITE_OTHER): Payer: Medicare Other | Admitting: Cardiology

## 2019-10-03 ENCOUNTER — Encounter: Payer: Self-pay | Admitting: Cardiology

## 2019-10-03 VITALS — BP 128/76 | Ht 65.0 in | Wt 192.0 lb

## 2019-10-03 DIAGNOSIS — I493 Ventricular premature depolarization: Secondary | ICD-10-CM

## 2019-10-03 DIAGNOSIS — E669 Obesity, unspecified: Secondary | ICD-10-CM | POA: Diagnosis not present

## 2019-10-03 DIAGNOSIS — G4733 Obstructive sleep apnea (adult) (pediatric): Secondary | ICD-10-CM

## 2019-10-03 DIAGNOSIS — I10 Essential (primary) hypertension: Secondary | ICD-10-CM

## 2019-10-03 DIAGNOSIS — G4730 Sleep apnea, unspecified: Secondary | ICD-10-CM

## 2019-10-03 LAB — LIPID PANEL
Chol/HDL Ratio: 2.8 ratio (ref 0.0–4.4)
Cholesterol, Total: 187 mg/dL (ref 100–199)
HDL: 67 mg/dL (ref >39)
LDL Chol Calc (NIH): 95 mg/dL (ref 0–99)
Triglycerides: 143 mg/dL (ref 0–149)
VLDL Cholesterol Cal: 25 mg/dL (ref 5–40)

## 2019-10-03 NOTE — Telephone Encounter (Signed)
-----   Message from Sueanne Margarita, MD sent at 10/03/2019 10:25 AM EST ----- Please order home sleep study for OSA ASAP - needs this done in the next week as she has a device but is getting a lot of stomach air and has not used in a long while and needs knee surgery by the end of the year and needs to get on a CPAP that she can tolerate

## 2019-10-03 NOTE — Telephone Encounter (Signed)
Staff message sent to [facility] patient has [facility]. No PA is required for HST. Ok to schedule.

## 2019-10-03 NOTE — Progress Notes (Signed)
Virtual Visit via Video Note   This visit type was conducted due to national recommendations for restrictions regarding the COVID-19 Pandemic (e.g. social distancing) in an effort to limit this patient's exposure and mitigate transmission in our community.  Due to her co-morbid illnesses, this patient is at least at moderate risk for complications without adequate follow up.  This format is felt to be most appropriate for this patient at this time.  All issues noted in this document were discussed and addressed.  A limited physical exam was performed with this format.  Please refer to the patient's chart for her consent to telehealth for [facility].  Evaluation Performed:  Follow-up visit  This visit type was conducted due to national recommendations for restrictions regarding the COVID-19 Pandemic (e.g. social distancing).  This format is felt to be most appropriate for this patient at this time.  All issues noted in this document were discussed and addressed.  No physical exam was performed (except for noted visual exam findings with Video Visits).  Please refer to the patient's chart (MyChart message for video visits and phone note for telephone visits) for the patient's consent to telehealth for [facility].  Date:  10/03/2019   ID:  Laura [name], DOB 07/26/1966, MRN [id number]  Patient Location:  Home  Provider location:   [place]  PCP:  Greig Right, MD  Sleep Medicine:  Fransico Him, MD Electrophysiologist:  None   Chief Complaint:  OSA  History of Present Illness:    Laura [name] is a 53 y.o. female who presents via audio/video conferencing for a telehealth visit today.    This is a 53yo female with a history of OSA dx in 2017 with sleep study done for excessive daytime sleepiness which showed mild OSA with an [facility] of 8.7/hr and minimum O2 sat of 81%.  She underwent CPAP tiration to 11cm H2O.  Unfortunately she has had some problems with her CPAP device and has not  been using it.  She says that she gets a lot of air in her stomach and her stomach swells up and makes her feel bad.  She uses a nasal mask which she tolerates and does not think that she mouth breathes any.  She is supposed to have knee surgery by the end of the year and wanted to get back on CPAP prior to her surgery.  She also has seen Dr. Caryl Comes for palpitations and was found to have PVCs that were resistant to CCB and BB.  She says that she feels fatigued in the am and during the day and has problems with palpitations ongoing.   The patient does not have symptoms concerning for COVID-19 infection (fever, chills, cough, or new shortness of breath).    Prior CV studies:   The following studies were reviewed today:  Sleep study and CPAP titration from 2017  Past Medical History:  Diagnosis Date   Anxiety and depression    Asthma    Bacterial vaginitis    Bowel obstruction ([place])    Chest pain    a. 01/2016 Ex MV: Hypertensive response. Freq PVCs w/ exercise. nl EF. No ST/T changes. No ischemia.   Complex ovarian cyst, left 03/08/2017   COPD (chronic obstructive pulmonary disease) (HCC)    Cystocele    Elevated fasting blood sugar    Exposure to hepatitis C    Fibromyalgia    GERD (gastroesophageal reflux disease)    Heart murmur    a. 03/2016 Echo:  EF 60-65%, no rwma, mild MR, nl LA size, nl RV fxn.   High cholesterol    Hypertension    Hypokalemia    IBS (irritable bowel syndrome)    Increased BMI    Kidney stones    Musculoskeletal pain 03/02/2016   Nasal septal perforation 05/12/2018   Hx of cocaine use   Osteoarthritis    Palpitations    a. 03/2016 Holter: Sinus rhythm, avg HR 83, max 123, min 64. 4 PACs. 10,356 isolated PVCs, one vent couplet, 3842 V bigeminy, 4 beats NSVT->prev on BB - dc 2/2 swelling.   Prediabetes 12/23/2015   Overview:  Hba1c higher but not diabetic. Took metformin to try to lessen   Raynaud disease    Rectocele    Sleep apnea      Urinary retention with incomplete bladder emptying    Vaginal dryness, menopausal    Vaginal enterocele    Vitamin D deficiency 12/03/2014   Yeast infection    Past Surgical History:  Procedure Laterality Date   27 HOUR [place] STUDY N/A 10/11/2018   Procedure: 24 HOUR PH STUDY;  Surgeon: Mauri Pole, MD;  Location: [facility];  Service: Endoscopy;  Laterality: N/A;   ABDOMINAL HYSTERECTOMY     ANKLE SURGERY     ran over by mother in car by [place]  09/02/2019   Procedure: BIOPSY;  Surgeon: Rush Landmark Telford Nab., MD;  Location: [place];  Service: Gastroenterology;;   COLONOSCOPY     COLONOSCOPY WITH PROPOFOL N/A 09/02/2019   Procedure: COLONOSCOPY WITH PROPOFOL;  Surgeon: Irving Copas., MD;  Location: [place];  Service: Gastroenterology;  Laterality: N/A;   COLPORRHAPHY  2015   posterior and enterocele ligation   CYSTOSCOPY  04/11/2017   Procedure: CYSTOSCOPY;  Surgeon: Defrancesco, Alanda Slim, MD;  Location: [facility];  Service: Gynecology;;   ESOPHAGEAL MANOMETRY N/A 10/11/2018   Procedure: ESOPHAGEAL MANOMETRY (EM);  Surgeon: Mauri Pole, MD;  Location: [facility];  Service: Endoscopy;  Laterality: N/A;   ESOPHAGOGASTRODUODENOSCOPY (EGD) WITH PROPOFOL N/A 09/02/2019   Procedure: ESOPHAGOGASTRODUODENOSCOPY (EGD) WITH PROPOFOL;  Surgeon: Rush Landmark Telford Nab., MD;  Location: [place];  Service: Gastroenterology;  Laterality: N/A;   LAPAROSCOPIC SALPINGO OOPHERECTOMY Left 04/11/2017   Procedure: LAPAROSCOPIC LEFT SALPINGO OOPHORECTOMY;  Surgeon: Brayton Mars, MD;  Location: [facility];  Service: Gynecology;  Laterality: Left;   LITHOTRIPSY     OOPHORECTOMY     PARTIAL HYSTERECTOMY     [place] IMPEDANCE STUDY N/A 10/11/2018   Procedure: [place] IMPEDANCE STUDY;  Surgeon: Mauri Pole, MD;  Location: [facility];  Service: Endoscopy;  Laterality: N/A;   thumb surgery     UPPER GASTROINTESTINAL  ENDOSCOPY       Current Meds  Medication Sig   albuterol (VENTOLIN HFA) 108 (90 Base) MCG/ACT inhaler Inhale 2 puffs into the lungs every 4 (four) hours as needed for wheezing or shortness of breath.   ALPRAZolam (XANAX) 1 MG tablet Take 1 mg by mouth 4 (four) times daily.    amphetamine-dextroamphetamine (ADDERALL) 30 MG tablet Take 30 mg by mouth 3 (three) times daily.    diclofenac sodium (VOLTAREN) 1 % GEL Apply 2-4 g topically 2 (two) times daily as needed. To knees   Evolocumab (REPATHA SURECLICK) XX123456 MG/ML SOAJ Inject 1 Dose into the skin every 14 (fourteen) days.   furosemide (LASIX) 20 MG tablet Take 20 mg by mouth 2 (two) times daily.   gabapentin (NEURONTIN)  600 MG tablet Take 600 mg by mouth 3 (three) times daily as needed (pain).    levothyroxine (SYNTHROID) 112 MCG tablet Take 112 mcg by mouth daily.   magnesium oxide (MAG-OX) 400 MG tablet Take 1 tablet (400 mg total) by mouth daily.   pantoprazole (PROTONIX) 40 MG tablet Take 1 tablet (40 mg total) by mouth daily.   propafenone (RYTHMOL SR) 225 MG 12 hr capsule Take 1 capsule (225 mg total) by mouth 2 (two) times daily.   quinapril (ACCUPRIL) 20 MG tablet Take 1 tablet (20 mg total) by mouth 2 (two) times a day.   tiZANidine (ZANAFLEX) 4 MG tablet Take 4 mg by mouth 3 (three) times daily.    zolpidem (AMBIEN) 10 MG tablet Take 10 mg by mouth at bedtime.     Allergies:   Meperidine, Shellfish allergy, Diltiazem, Acebutolol, Amlodipine, Codeine, Cymbalta [duloxetine hcl], Dexilant [dexlansoprazole], Hydrocodone, Losartan, Metoprolol, Mirtazapine, Omeprazole, Oxycodone, Sectral [acebutolol hcl], and Tramadol   Social History   Tobacco Use   Smoking status: Former Smoker    Quit date: 04/08/1995    Years since quitting: 24.5   Smokeless tobacco: Never Used  Substance Use Topics   Alcohol use: Not Currently    Alcohol/week: 1.0 standard drinks    Types: 1 Glasses of wine per week    Comment: rare; once  every 6 months   Drug use: No     Family Hx: The patient's family history includes Breast cancer (age of onset: 51) in her mother; Colon cancer in her paternal grandmother; Diabetes in her father; Diverticulitis in her mother; Esophageal cancer in her maternal grandfather; Stroke in her father. There is no history of Ovarian cancer or Stomach cancer.  ROS:   Please see the history of present illness.     All other systems reviewed and are negative.   Labs/Other Tests and Data Reviewed:    Recent Labs: 06/20/2019: TSH 2.20 09/01/2019: ALT 26 09/02/2019: BUN 10; Creatinine, Ser 0.65; Hemoglobin 11.9; Magnesium 1.8; Platelets 249; Potassium 3.7; Sodium 142   Recent Lipid Panel Lab Results  Component Value Date/Time   CHOL 187 10/02/2019 09:20 AM   TRIG 143 10/02/2019 09:20 AM   HDL 67 10/02/2019 09:20 AM   CHOLHDL 2.8 10/02/2019 09:20 AM   CHOLHDL 6.1 (H) 06/20/2019 09:08 AM   LDLCALC 95 10/02/2019 09:20 AM   LDLCALC 196 (H) 06/20/2019 09:08 AM   LDLDIRECT 222 (H) 04/26/2019 01:38 PM    Wt Readings from Last 3 Encounters:  10/03/19 192 lb (87.1 kg)  09/02/19 211 lb 6.7 oz (95.9 kg)  08/23/19 194 lb (88 kg)     Objective:    Vital Signs:  BP 128/76    Ht 5\' 5"  (1.651 m)    Wt 192 lb (87.1 kg)    BMI 31.95 kg/m    CONSTITUTIONAL:  Well nourished, well developed female in no acute distress.  EYES: anicteric MOUTH: oral mucosa is pink RESPIRATORY: Normal respiratory effort, symmetric expansion CARDIOVASCULAR: No peripheral edema SKIN: No rash, lesions or ulcers MUSCULOSKELETAL: no digital cyanosis NEURO: Cranial Nerves II-XII grossly intact, moves all extremities PSYCH: Intact judgement and insight.  A&O x 3, Mood/affect appropriate   ASSESSMENT & PLAN:    1.  OSA -she has a hx of mild OSA in the past with an [facility] of 8/hr and had been on CPAP at 11cm H2O -she has not been using her PAP device due to problems with swallowing a lot of air when using it  that makes her  feel very uncomfortable -she has had problems with PVCs and fatigue and wants to get back on her PAP therapy especially since she has a knee surgery coming up -I have recommended a home sleep study to document degree of OSA.  If she only has mild OSA then would place her on CPAP on auto pressure.  If moderate to severe OSA then would recommend in lab CPAP titration.   2.  HTN -BP under control -continue Quinapril 20mg  daily  3.  Obesity -I have encouraged her to get into a routine exercise program and cut back on carbs and portions.   4.  PVC's -followed by Dr. Caryl Comes -may be exacerbated by untreated OSA  COVID-19 Education: The signs and symptoms of COVID-19 were discussed with the patient and how to seek care for testing (follow up with PCP or arrange E-visit).  The importance of social distancing was discussed today.  Patient Risk:   After full review of this patient's clinical status, I feel that they are at least moderate risk at this time.  Time:   Today, I have spent 20 minutes directly with the patient on telemedicine discussing medical problems including PVCs, OSA, HTN.  We also reviewed the symptoms of COVID 19 and the ways to protect against contracting the virus with telehealth technology.  I spent an additional 5 minutes reviewing patient's chart including sleep study and CPAP titration.  Medication Adjustments/Labs and Tests Ordered: Current medicines are reviewed at length with the patient today.  Concerns regarding medicines are outlined above.  Tests Ordered: No orders of the defined types were placed in this encounter.  Medication Changes: No orders of the defined types were placed in this encounter.   Disposition:  Follow up prn after sleep study  Signed, Fransico Him, MD  10/03/2019 8:51 PM    [place]

## 2019-10-04 DIAGNOSIS — M25561 Pain in right knee: Secondary | ICD-10-CM | POA: Diagnosis not present

## 2019-10-08 DIAGNOSIS — R6883 Chills (without fever): Secondary | ICD-10-CM | POA: Diagnosis not present

## 2019-10-08 DIAGNOSIS — M238X1 Other internal derangements of right knee: Secondary | ICD-10-CM | POA: Diagnosis not present

## 2019-10-08 DIAGNOSIS — I10 Essential (primary) hypertension: Secondary | ICD-10-CM | POA: Diagnosis not present

## 2019-10-08 DIAGNOSIS — E7800 Pure hypercholesterolemia, unspecified: Secondary | ICD-10-CM | POA: Diagnosis not present

## 2019-10-08 DIAGNOSIS — E038 Other specified hypothyroidism: Secondary | ICD-10-CM | POA: Diagnosis not present

## 2019-10-09 ENCOUNTER — Telehealth: Payer: Self-pay | Admitting: Cardiovascular Disease

## 2019-10-09 NOTE — Telephone Encounter (Signed)
   Primary Cardiologist: Fransico Him, MD  Chart reviewed as part of pre-operative protocol coverage. Given past medical history and time since last visit, based on ACC/AHA guidelines, Ady I Atkins would be at acceptable risk for the planned procedure without further cardiovascular testing.   She has a RCRI class II risk, 0.9% risk of major cardiac event.  No anticoagulation medications need to be held prior to surgery.  I will route this recommendation to the requesting party via Epic fax function and remove from pre-op pool.  Please call with questions.  Jossie Ng. [facility] [place] [address] Office [phone] Fax [phone]

## 2019-10-09 NOTE — Telephone Encounter (Signed)
° °  [facility] Pre-operative Risk Assessment    Request for surgical clearance:  1. What type of surgery is being performed? R knee Arthroscopy   2. When is this surgery scheduled? TBD   3. What type of clearance is required (medical clearance vs. Pharmacy clearance to hold med vs. Both)? Medical   4. Are there any medications that need to be held prior to surgery and how long? Not noted    5. Practice name and name of physician performing surgery? [facility] Dr. Mia Creek    6. What is your office phone number [phone]    7.   What is your office fax number [phone]  8.   Anesthesia type (None, local, MAC, general) ? General    Laura [name] 10/09/2019, 10:48 AM  _________________________________________________________________   (provider comments below)

## 2019-10-11 NOTE — Telephone Encounter (Addendum)
Patient is aware and agreeable to Home Sleep Study through [facility]. Patient is scheduled for 10/14/19 at 7:30 to pick up home sleep kit and meet with Respiratory therapist at [facility]. Patient is aware that if this appointment date and time does not work for them they should contact [place] directly at [phone]. Patient is aware that a sleep packet will be sent from [facility] in week. Patient is agreeable to treatment and thankful for call.

## 2019-10-11 NOTE — Telephone Encounter (Signed)
Patient called in to report she has tested positive for covid 19 and to cancel her home sleep test.  Sleep lab notified and will call patient with details of rescheduling.

## 2019-10-12 ENCOUNTER — Ambulatory Visit: Payer: Medicare Other | Admitting: Cardiovascular Disease

## 2019-10-15 ENCOUNTER — Telehealth (INDEPENDENT_AMBULATORY_CARE_PROVIDER_SITE_OTHER): Payer: Medicare Other | Admitting: Internal Medicine

## 2019-10-15 ENCOUNTER — Encounter: Payer: Self-pay | Admitting: Internal Medicine

## 2019-10-15 VITALS — BP 126/78 | HR 80 | Temp 100.0°F | Ht 65.0 in | Wt 187.0 lb

## 2019-10-15 DIAGNOSIS — U071 COVID-19: Secondary | ICD-10-CM | POA: Diagnosis not present

## 2019-10-15 DIAGNOSIS — E7849 Other hyperlipidemia: Secondary | ICD-10-CM
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
## 2019-10-15 NOTE — Patient Instructions (Signed)
Medication Instructions:  Your physician recommends that you continue on your current medications as directed. Please refer to the Current Medication list given to you today.  *If you need a refill on your cardiac medications before your next appointment, please call your pharmacy*  Lab Work: FASTING lab work in 6 months to check cholesterol If you have labs (blood work) drawn today and your tests are completely normal, you will receive your results only by: . MyChart Message (if you have MyChart) OR . A paper copy in the mail If you have any lab test that is abnormal or we need to change your treatment, we will call you to review the results.  Testing/Procedures: NONE  Follow-Up: At [facility], you and your health needs are our priority.  As part of our continuing mission to provide you with exceptional heart care, we have created designated Provider Care Teams.  These Care Teams include your primary Cardiologist (physician) and Advanced Practice Providers (APPs -  Physician Assistants and Nurse Practitioners) who all work together to provide you with the care you need, when you need it.  Your next appointment:   6 month(s)  The format for your next appointment:   Either In Person or Virtual  Provider:   K. Chad Hilty, MD  Other Instructions   

## 2019-10-18 ENCOUNTER — Telehealth (INDEPENDENT_AMBULATORY_CARE_PROVIDER_SITE_OTHER): Payer: Medicare Other | Admitting: Cardiovascular Disease

## 2019-10-18 ENCOUNTER — Other Ambulatory Visit: Payer: Self-pay

## 2019-10-18 VITALS — BP 127/86 | HR 78 | Temp 100.8°F | Ht 65.0 in | Wt 193.0 lb

## 2019-10-18 DIAGNOSIS — E7849 Other hyperlipidemia: Secondary | ICD-10-CM

## 2019-10-18 DIAGNOSIS — U071 COVID-19: Secondary | ICD-10-CM

## 2019-10-18 DIAGNOSIS — I493 Ventricular premature depolarization: Secondary | ICD-10-CM | POA: Diagnosis not present

## 2019-10-18 DIAGNOSIS — I10 Essential (primary) hypertension: Secondary | ICD-10-CM

## 2019-10-18 NOTE — Patient Instructions (Signed)
Medication Instructions:  Continue same medications *If you need a refill on your cardiac medications before your next appointment, please call your pharmacy*  Lab Work: None If you have labs (blood work) drawn today and your tests are completely normal, you will receive your results only by: Marland Kitchen MyChart Message (if you have MyChart) OR . A paper copy in the mail If you have any lab test that is abnormal or we need to change your treatment, we will call you to review the results.  Testing/Procedures: Purchase a pulse oximeter to check oxygen level.  If below 90, recommend ED evaluation.  Follow-Up: At [facility], you and your health needs are our priority.  As part of our continuing mission to provide you with exceptional heart care, we have created designated Provider Care Teams.  These Care Teams include your primary Cardiologist (physician) and Advanced Practice Providers (APPs -  Physician Assistants and Nurse Practitioners) who all work together to provide you with the care you need, when you need it.  Your next appointment:   6 month(s)  The format for your next appointment:   Either In Person or Virtual  Provider:    You may see Kathlyn Sacramento, MD or one of the following Advanced Practice Providers on your designated Care Team:    Murray Hodgkins, NP  Christell Faith, PA-C  Marrianne Mood, PA-C   Other Instructions Keep your scheduled follow-up appointment with Dr. Caryl Comes in January.
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
## 2019-11-07 ENCOUNTER — Encounter (HOSPITAL_COMMUNITY): Payer: Self-pay | Admitting: Emergency Medicine

## 2019-11-07 ENCOUNTER — Emergency Department (HOSPITAL_COMMUNITY)
Admission: EM | Admit: 2019-11-07 | Discharge: 2019-11-08 | Disposition: A | Discharge status: Home / Self Care | Payer: Medicare Other | Attending: Emergency Medicine | Admitting: Emergency Medicine

## 2019-11-07 ENCOUNTER — Other Ambulatory Visit: Payer: Self-pay

## 2019-11-07 DIAGNOSIS — Z20822 Contact with and (suspected) exposure to covid-19: Secondary | ICD-10-CM | POA: Insufficient documentation

## 2019-11-07 DIAGNOSIS — K529 Noninfective gastroenteritis and colitis, unspecified: Secondary | ICD-10-CM | POA: Insufficient documentation

## 2019-11-07 DIAGNOSIS — Z743 Need for continuous supervision: Secondary | ICD-10-CM | POA: Diagnosis not present

## 2019-11-07 DIAGNOSIS — Z79899 Other long term (current) drug therapy: Secondary | ICD-10-CM | POA: Diagnosis not present

## 2019-11-07 DIAGNOSIS — R1110 Vomiting, unspecified: Secondary | ICD-10-CM | POA: Diagnosis not present

## 2019-11-07 DIAGNOSIS — J449 Chronic obstructive pulmonary disease, unspecified: Secondary | ICD-10-CM | POA: Insufficient documentation

## 2019-11-07 DIAGNOSIS — R112 Nausea with vomiting, unspecified: Secondary | ICD-10-CM | POA: Diagnosis not present

## 2019-11-07 DIAGNOSIS — I10 Essential (primary) hypertension: Secondary | ICD-10-CM | POA: Diagnosis not present

## 2019-11-07 DIAGNOSIS — R1084 Generalized abdominal pain: Secondary | ICD-10-CM | POA: Diagnosis not present

## 2019-11-07 DIAGNOSIS — E860 Dehydration: Secondary | ICD-10-CM | POA: Diagnosis not present

## 2019-11-07 DIAGNOSIS — Z8709 Personal history of other diseases of the respiratory system: Secondary | ICD-10-CM | POA: Diagnosis not present

## 2019-11-07 DIAGNOSIS — E039 Hypothyroidism, unspecified: Secondary | ICD-10-CM | POA: Insufficient documentation

## 2019-11-07 DIAGNOSIS — U071 COVID-19: Secondary | ICD-10-CM | POA: Diagnosis not present

## 2019-11-07 DIAGNOSIS — Z87891 Personal history of nicotine dependence: Secondary | ICD-10-CM | POA: Insufficient documentation

## 2019-11-07 DIAGNOSIS — R0602 Shortness of breath: Secondary | ICD-10-CM | POA: Diagnosis not present

## 2019-11-07 DIAGNOSIS — I491 Atrial premature depolarization: Secondary | ICD-10-CM | POA: Diagnosis not present

## 2019-11-07 DIAGNOSIS — M7918 Myalgia, other site: Secondary | ICD-10-CM | POA: Diagnosis not present

## 2019-11-07 LAB — COMPREHENSIVE METABOLIC PANEL
ALT: 21 U/L (ref 0–44)
AST: 20 U/L (ref 15–41)
Albumin: 4.2 g/dL (ref 3.5–5.0)
Alkaline Phosphatase: 66 U/L (ref 38–126)
Anion gap: 14 (ref 5–15)
BUN: 15 mg/dL (ref 6–20)
CO2: 23 mmol/L (ref 22–32)
Calcium: 9.4 mg/dL (ref 8.9–10.3)
Chloride: 105 mmol/L (ref 98–111)
Creatinine, Ser: 0.93 mg/dL (ref 0.44–1.00)
GFR calc Af Amer: >60 mL/min (ref >60)
GFR calc non Af Amer: >60 mL/min (ref >60)
Glucose, Bld: 101 mg/dL — ABNORMAL HIGH (ref 70–99)
Potassium: 3.8 mmol/L (ref 3.5–5.1)
Sodium: 142 mmol/L (ref 135–145)
Total Bilirubin: 0.7 mg/dL (ref 0.3–1.2)
Total Protein: 7.5 g/dL (ref 6.5–8.1)

## 2019-11-07 LAB — URINALYSIS, ROUTINE W REFLEX MICROSCOPIC
Bilirubin Urine: NEGATIVE
Glucose, UA: NEGATIVE mg/dL
Hgb urine dipstick: NEGATIVE
Ketones, ur: NEGATIVE mg/dL
Leukocytes,Ua: NEGATIVE
Nitrite: NEGATIVE
Protein, ur: NEGATIVE mg/dL
Specific Gravity, Urine: 1.014 (ref 1.005–1.030)
pH: 5 (ref 5.0–8.0)

## 2019-11-07 LAB — CBC
HCT: 46.4 % — ABNORMAL HIGH (ref 36.0–46.0)
Hemoglobin: 15.3 g/dL — ABNORMAL HIGH (ref 12.0–15.0)
MCH: 28.5 pg (ref 26.0–34.0)
MCHC: 33 g/dL (ref 30.0–36.0)
MCV: 86.6 fL (ref 80.0–100.0)
Platelets: 366 10*3/uL (ref 150–400)
RBC: 5.36 MIL/uL — ABNORMAL HIGH (ref 3.87–5.11)
RDW: 13.6 % (ref 11.5–15.5)
WBC: 7.2 10*3/uL (ref 4.0–10.5)
nRBC: 0 % (ref 0.0–0.2)

## 2019-11-07 LAB — LIPASE, BLOOD: Lipase: 28 U/L (ref 11–51)

## 2019-11-07 MED ORDER — SODIUM CHLORIDE 0.9% FLUSH
3.0000 mL | Freq: Once | INTRAVENOUS | Status: AC
  Administered 2019-11-08: 3 mL via INTRAVENOUS

## 2019-11-07 NOTE — ED Triage Notes (Addendum)
Patient arrived with EMS from home . Tested positive for Covid19 10/08/19 , reports emesis , diarrhea and body aches for several days . Patient added generalized abdominal pain and headache.

## 2019-11-08 ENCOUNTER — Ambulatory Visit: Payer: Medicare Other | Admitting: Internal Medicine

## 2019-11-08 ENCOUNTER — Emergency Department (HOSPITAL_COMMUNITY): Payer: Medicare Other

## 2019-11-08 DIAGNOSIS — R1110 Vomiting, unspecified: Secondary | ICD-10-CM | POA: Diagnosis not present

## 2019-11-08 DIAGNOSIS — R112 Nausea with vomiting, unspecified: Secondary | ICD-10-CM | POA: Diagnosis not present

## 2019-11-08 DIAGNOSIS — R0602 Shortness of breath: Secondary | ICD-10-CM | POA: Diagnosis not present

## 2019-11-08 LAB — POC SARS CORONAVIRUS 2 AG -  ED: SARS Coronavirus 2 Ag: NEGATIVE

## 2019-11-08 LAB — LACTIC ACID, PLASMA: Lactic Acid, Venous: 0.9 mmol/L (ref 0.5–1.9)

## 2019-11-08 MED ORDER — SODIUM CHLORIDE 0.9 % IV BOLUS
1000.0000 mL | Freq: Once | INTRAVENOUS | Status: AC
  Administered 2019-11-08: 1000 mL via INTRAVENOUS

## 2019-11-08 MED ORDER — HYDROMORPHONE HCL 1 MG/ML IJ SOLN
1.0000 mg | Freq: Once | INTRAMUSCULAR | Status: AC
  Administered 2019-11-08: 1 mg via INTRAVENOUS
  Filled 2019-11-08: qty 1

## 2019-11-08 MED ORDER — ACETAMINOPHEN 500 MG PO TABS
1000.0000 mg | ORAL_TABLET | Freq: Once | ORAL | Status: AC
  Administered 2019-11-08: 1000 mg via ORAL
  Filled 2019-11-08: qty 2

## 2019-11-08 MED ORDER — PROMETHAZINE HCL 25 MG/ML IJ SOLN
12.5000 mg | Freq: Once | INTRAMUSCULAR | Status: AC
  Administered 2019-11-08: 12.5 mg via INTRAVENOUS
  Filled 2019-11-08: qty 1

## 2019-11-08 MED ORDER — PROMETHAZINE HCL 12.5 MG PO TABS: 12.5000 mg | ORAL_TABLET | Freq: Four times a day (QID) | ORAL | 0 refills | Status: DC | PRN

## 2019-11-08 MED ORDER — IOHEXOL 300 MG/ML  SOLN
100.0000 mL | Freq: Once | INTRAMUSCULAR | Status: AC | PRN
  Administered 2019-11-08: 100 mL via INTRAVENOUS

## 2019-11-08 MED ORDER — ONDANSETRON HCL 4 MG/2ML IJ SOLN
4.0000 mg | Freq: Once | INTRAMUSCULAR | Status: AC
  Administered 2019-11-08: 4 mg via INTRAVENOUS
  Filled 2019-11-08: qty 2

## 2019-11-08 MED ORDER — MORPHINE SULFATE (PF) 4 MG/ML IV SOLN: 4.0000 mg | Freq: Once | INTRAVENOUS | Status: DC

## 2019-11-08 MED ORDER — ONDANSETRON 4 MG PO TBDP
4.0000 mg | ORAL_TABLET | Freq: Once | ORAL | Status: AC
  Administered 2019-11-08: 4 mg via ORAL
  Filled 2019-11-08: qty 1

## 2019-11-08 NOTE — ED Notes (Signed)
Patient transported to CT 
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
## 2019-11-08 NOTE — ED Notes (Signed)
Pt had one episode of diarrhea, able to use bedside commode with minimal assist.

## 2019-11-08 NOTE — Discharge Instructions (Signed)
Stay hydrated.   Take phenergan for nausea   See your doctor  Return to ER if you have worse vomiting, abdominal pain, fever, trouble breathing

## 2019-11-09 ENCOUNTER — Other Ambulatory Visit: Payer: Self-pay | Admitting: Internal Medicine

## 2019-11-09 DIAGNOSIS — R1084 Generalized abdominal pain: Secondary | ICD-10-CM | POA: Diagnosis not present

## 2019-11-13 LAB — CULTURE, BLOOD (ROUTINE X 2)
Culture: NO GROWTH
Culture: NO GROWTH
Special Requests: ADEQUATE
Special Requests: ADEQUATE

## 2019-11-14 NOTE — Telephone Encounter (Addendum)
PATIENT CALLED TO RESCHEDULE Patient is aware and agreeable to Home Sleep Study through [facility]. Patient is scheduled for 11/21/19 at 7:30 pm to pick up home sleep kit and meet with Respiratory therapist at [facility]. Patient is aware that if this appointment date and time does not work for them they should contact [facility] directly at [phone]. Patient is aware that a sleep packet will be sent from [facility] in week. Patient is agreeable to treatment and thankful for call.

## 2019-11-21 ENCOUNTER — Ambulatory Visit: Payer: Medicare Other | Attending: Cardiology | Admitting: Cardiology

## 2019-11-21 ENCOUNTER — Other Ambulatory Visit: Payer: Self-pay

## 2019-11-21 DIAGNOSIS — G4733 Obstructive sleep apnea (adult) (pediatric): Secondary | ICD-10-CM | POA: Diagnosis not present

## 2019-11-21 DIAGNOSIS — R0683 Snoring: Secondary | ICD-10-CM | POA: Insufficient documentation

## 2019-11-21 DIAGNOSIS — G4730 Sleep apnea, unspecified: Secondary | ICD-10-CM

## 2019-11-22 ENCOUNTER — Telehealth: Payer: Self-pay | Admitting: *Deleted

## 2019-11-22 NOTE — Telephone Encounter (Signed)
-----   Message from Sueanne Margarita, MD sent at 11/22/2019  4:24 PM EST ----- Please  cancel the order it was placed on wrong patient  Traci ----- Message ----- From: Freada Bergeron, CMA Sent: 11/22/2019   4:09 PM EST To: Sueanne Margarita, MD  Yes there is an order in for a cpap. ----- Message ----- From: Sueanne Margarita, MD Sent: 11/22/2019  11:56 AM EST To: Freada Bergeron, CMA  Nina  Can you see if I accidentally put in an order for CPAP on this patient _ there should not be an order for CPAP  Traci

## 2019-11-22 NOTE — Telephone Encounter (Signed)
No order placed for patient.

## 2019-11-23 ENCOUNTER — Encounter: Payer: Self-pay | Admitting: *Deleted

## 2019-11-23 ENCOUNTER — Telehealth: Payer: Self-pay | Admitting: *Deleted

## 2019-11-23 NOTE — Telephone Encounter (Signed)
Informed patient of sleep study results and patient understanding was verbalized. Patient understands her sleep study showed they have mild OSA. Please set up visit to discuss. Pt is aware and agreeable to her results. Pt has an appointment scheduled for 12/11/19 at 8:20.

## 2019-11-23 NOTE — Telephone Encounter (Signed)
-----   Message from Laura Margarita, MD sent at 11/23/2019  9:31 AM EST ----- Please let patient know that they have mild OSA.  Please set up visit to discuss OV.

## 2019-11-23 NOTE — Telephone Encounter (Signed)
This encounter was created in error - please disregard.

## 2019-11-23 NOTE — Telephone Encounter (Signed)
-----   Message from Sueanne Margarita, MD sent at 11/23/2019  9:31 AM EST ----- Please let patient know that they have mild OSA.  Please set up visit to discuss OV.

## 2019-11-23 NOTE — Procedures (Signed)
    Patient Name: Laura [name], Laura [name] Date: 11/21/2019 Gender: Female D.O.B: May 27, 1966 Age (years): 58 Referring Provider: Fransico Him MD, ABSM Height (inches): 65 Interpreting Physician: Fransico Him MD, ABSM Weight (lbs): 194 RPSGT: Peak, Robert BMI: 32 MRN: [id number]  CLINICAL INFORMATION Sleep Study Type: HST  Indication for sleep study: Witnessed Apneas  Epworth Sleepiness Score: NA  SLEEP STUDY TECHNIQUE A multi-channel overnight portable sleep study was performed. The channels recorded were: nasal airflow, thoracic respiratory movement, and oxygen saturation with a pulse oximetry. Snoring was also monitored.  MEDICATIONS Patient self administered medications include: N/A.  SLEEP ARCHITECTURE Patient was studied for 511 minutes. The sleep efficiency was 85.2 % and the patient was supine for 70%. The arousal index was 0.0 per hour.  RESPIRATORY PARAMETERS The overall AHI was 6.8 per hour, with a central apnea index of 1.2 per hour.  The oxygen nadir was 87% during sleep.  CARDIAC DATA Mean heart rate during sleep was 63.0 bpm.  IMPRESSIONS Mild obstructive sleep apnea occurred during this study (AHI = 6.8/h). No significant central sleep apnea occurred during this study (CAI = 1.2/h). Mild oxygen desaturation was noted during this study (Min O2 = 87%). Patient snored 25.0% during the sleep.  DIAGNOSIS Obstructive Sleep Apnea (327.23 [G47.33 ICD-10])  RECOMMENDATIONS Therapeutic CPAP titration to determine optimal pressure required to alleviate sleep disordered breathing. Positional therapy avoiding supine position during sleep. Surgical consultation for Uvulopalatopharyngoplasty (UPPP) may be considered. Oral appliance may be considered. Avoid alcohol, sedatives and other CNS depressants that may worsen sleep apnea and disrupt normal sleep architecture. Sleep hygiene should be reviewed to assess factors that may improve sleep quality. Weight management and  regular exercise should be initiated or continued. Return to Sleep Center to discuss the results of this study Patient may benefit from in-lab study  [Electronically signed] 11/23/2019 09:27 AM  Fransico Him MD, ABSM Diplomate, American Board of Sleep Medicine

## 2019-11-23 NOTE — Telephone Encounter (Signed)

## 2019-11-24 ENCOUNTER — Other Ambulatory Visit: Payer: Self-pay | Admitting: Internal Medicine

## 2019-11-27 DIAGNOSIS — M7542 Impingement syndrome of left shoulder: Secondary | ICD-10-CM | POA: Diagnosis not present

## 2019-11-28 ENCOUNTER — Telehealth: Payer: Self-pay | Admitting: Cardiovascular Disease

## 2019-11-28 NOTE — Telephone Encounter (Signed)
   [facility] Pre-operative Risk Assessment    Request for surgical clearance:  1. What type of surgery is being performed? RT Knee Arthroscopy   2. When is this surgery scheduled? TBD  3. What type of clearance is required (medical clearance vs. Pharmacy clearance to hold med vs. Both)? medical  4. Are there any medications that need to be held prior to surgery and how long? None listed, please advise if needed  5. Practice name and name of physician performing surgery? [facility] - [place], Dr Kurtis Bushman  6. What is your office phone number [phone] ext. [phone]   7.   What is your office fax number [place]  8.   Anesthesia type (None, local, MAC, general) ? General    Laura [name] 11/28/2019, 8:40 AM  _________________________________________________________________   (provider comments below)

## 2019-11-28 NOTE — Telephone Encounter (Signed)
   Primary Cardiologist: Kathlyn Sacramento, MD  Chart reviewed as part of pre-operative protocol coverage. Given past medical history and time since last visit, based on ACC/AHA guidelines, Rory I Atkins would be at acceptable risk for the planned procedure without further cardiovascular testing.   Patient with hx of symptomatic PVCs and her symptoms has been improved since on Rhythmol. She will be at risk for frequent burst of PVCs during and after surgery.   I will route this recommendation to the requesting party via Epic fax function and remove from pre-op pool.  Please call with questions.  [place], [place] 11/28/2019, 9:02 AM

## 2019-12-03 DIAGNOSIS — M25512 Pain in left shoulder: Secondary | ICD-10-CM | POA: Insufficient documentation

## 2019-12-04 ENCOUNTER — Telehealth: Payer: Self-pay | Admitting: Internal Medicine

## 2019-12-04 DIAGNOSIS — E038 Other specified hypothyroidism: Secondary | ICD-10-CM | POA: Diagnosis not present

## 2019-12-04 DIAGNOSIS — Z01818 Encounter for other preprocedural examination: Secondary | ICD-10-CM | POA: Diagnosis not present

## 2019-12-04 DIAGNOSIS — Z79891 Long term (current) use of opiate analgesic: Secondary | ICD-10-CM | POA: Diagnosis not present

## 2019-12-04 NOTE — Telephone Encounter (Signed)
The patient was seen by Dr. Caryl Comes on 08/23/19. She had felt her PVC's were worse and she was started on flecainide at that time.  She sent a patient advise request ~ 11/20 stating she did not feel like the flecainide was working for her. Orders received from Dr. Caryl Comes to: - stop flecainide - start propafenone 225 mg BID  She was to follow up with Dr. Caryl Comes on 11/08/19, but cancelled due to hospitalization. She had tested + for COVID 19 on 10/08/19, but was having ongoing symptoms at the time of her last scheduled visit with Dr. Caryl Comes.   I attempted to call the patient back. No answer- I left a message on her identified voice mail that she needs an OV, either in person or virtual. I advised Dr. Caryl Comes has an opening at 10:40 am on 2/4 if she could make this visit.  I have held the slot for her.  I asked that she call back to confirm.

## 2019-12-04 NOTE — Telephone Encounter (Signed)
Pt c/o medication issue:  1. Name of Medication: propafenone (RHTHMOL)  2. How are you currently taking this medication (dosage and times per day)? 225 MG 1 capsule 2 times daily   3. Are you having a reaction (difficulty breathing--STAT)? no  4. What is your medication issue? Patient calling, states that medication refill is $340 and she is unable to afford at this time.  Patient also feels like it does not do much good.  Would like to check on other options.  Please call to discuss.

## 2019-12-04 NOTE — Telephone Encounter (Signed)
Patient called back and spoke with scheduling. She is scheduled to see Dr. Caryl Comes on 2/4 at 10:40 am.

## 2019-12-06 ENCOUNTER — Encounter: Payer: Self-pay | Admitting: Pharmacist

## 2019-12-06 ENCOUNTER — Ambulatory Visit (INDEPENDENT_AMBULATORY_CARE_PROVIDER_SITE_OTHER): Payer: Medicare Other | Admitting: Internal Medicine

## 2019-12-06 ENCOUNTER — Encounter: Payer: Self-pay | Admitting: Internal Medicine

## 2019-12-06 ENCOUNTER — Other Ambulatory Visit: Payer: Self-pay

## 2019-12-06 VITALS — BP 142/82 | HR 93 | Ht 65.0 in | Wt 191.0 lb

## 2019-12-06 DIAGNOSIS — I493 Ventricular premature depolarization: Secondary | ICD-10-CM

## 2019-12-06 MED ORDER — DRONEDARONE HCL 400 MG PO TABS: 400.0000 mg | ORAL_TABLET | Freq: Two times a day (BID) | ORAL | 0 refills | Status: DC

## 2019-12-06 NOTE — Telephone Encounter (Signed)
Samples for Multaq to be left downstairs at [address] office for patient to pick up 2/5

## 2019-12-06 NOTE — Patient Instructions (Addendum)
Medication Instructions:  - Your physician has recommended you make the following change in your medication:   1) Stop Rhythmol (propafenone)  2) Start Multaq (dronedarone) 400 mg- take 1 tablet (400 mg) by mouth TWICE daily ( about every 12 hours)  Samples given: Multaq 400 mg Lot: [id number] Exp: 1/22 # 2 boxes  Additional samples will be provided from the [place] office.  [address] [place], [place] [address] [phone]  *If you need a refill on your cardiac medications before your next appointment, please call your pharmacy*  Lab Work: - none ordered  If you have labs (blood work) drawn today and your tests are completely normal, you will receive your results only by: Marland Kitchen MyChart Message (if you have MyChart) OR . A paper copy in the mail If you have any lab test that is abnormal or we need to change your treatment, we will call you to review the results.  Testing/Procedures: - You have been referred to : Dr. Thompson Grayer for consideration of a PVC ablation - you will be called to schedule this  Follow-Up: At [facility], you and your health needs are our priority.  As part of our continuing mission to provide you with exceptional heart care, we have created designated Provider Care Teams.  These Care Teams include your primary Cardiologist (physician) and Advanced Practice Providers (APPs -  Physician Assistants and Nurse Practitioners) who all work together to provide you with the care you need, when you need it.  Your next appointment:   Pending your consultation with Dr. Rayann Heman   The format for your next appointment:   In person  Provider:   Virl Axe, MD  Other Instructions n/a
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
## 2019-12-11 ENCOUNTER — Telehealth (INDEPENDENT_AMBULATORY_CARE_PROVIDER_SITE_OTHER): Payer: Medicare Other | Admitting: Cardiology

## 2019-12-11 ENCOUNTER — Other Ambulatory Visit: Payer: Self-pay

## 2019-12-11 ENCOUNTER — Telehealth: Payer: Self-pay | Admitting: Internal Medicine

## 2019-12-11 VITALS — BP 123/78 | HR 76 | Ht 65.0 in | Wt 191.0 lb

## 2019-12-11 DIAGNOSIS — G4733 Obstructive sleep apnea (adult) (pediatric): Secondary | ICD-10-CM | POA: Diagnosis not present

## 2019-12-11 DIAGNOSIS — I10 Essential (primary) hypertension: Secondary | ICD-10-CM

## 2019-12-11 DIAGNOSIS — I493 Ventricular premature depolarization: Secondary | ICD-10-CM | POA: Diagnosis not present
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
## 2019-12-11 NOTE — Telephone Encounter (Signed)
PA for repatha 140mg /mL submitted via CMM (Key: [id number])

## 2019-12-11 NOTE — Telephone Encounter (Signed)
Request Reference Number: [id number]. REPATHA SURE INJ 140MG /ML is approved through 10/31/2020. Your patient may now fill this prescription and it will be covered

## 2019-12-12 ENCOUNTER — Telehealth: Payer: Self-pay | Admitting: *Deleted

## 2019-12-12 DIAGNOSIS — G4733 Obstructive sleep apnea (adult) (pediatric): Secondary | ICD-10-CM

## 2019-12-12 NOTE — Telephone Encounter (Signed)
-----   Message from Sueanne Margarita, MD sent at 12/11/2019  8:42 AM EST ----- Please order a CPAP titration in lab due to problems with stomach bloating on PAP in the past

## 2019-12-12 NOTE — Telephone Encounter (Signed)
Staff message sent to Gae Bon per [facility] web portal no PA required. Ok to schedule CPAP titration. Decision ID [id number].

## 2019-12-12 NOTE — Telephone Encounter (Signed)
cpap titration order placed for in lab testing.

## 2019-12-12 NOTE — Telephone Encounter (Signed)
-----   Message from Freada Bergeron, [place] sent at 12/12/2019 12:39 PM EST ----- Regarding: precert  CPAP titration

## 2019-12-13 ENCOUNTER — Encounter (HOSPITAL_BASED_OUTPATIENT_CLINIC_OR_DEPARTMENT_OTHER): Payer: Self-pay

## 2019-12-13 ENCOUNTER — Telehealth: Payer: Self-pay

## 2019-12-14 ENCOUNTER — Telehealth (INDEPENDENT_AMBULATORY_CARE_PROVIDER_SITE_OTHER): Payer: Medicare Other | Admitting: Internal Medicine

## 2019-12-14 ENCOUNTER — Telehealth: Payer: Self-pay | Admitting: *Deleted

## 2019-12-14 ENCOUNTER — Encounter: Payer: Self-pay | Admitting: Internal Medicine

## 2019-12-14 ENCOUNTER — Telehealth: Payer: Self-pay

## 2019-12-14 VITALS — BP 155/106 | HR 96 | Ht 65.0 in | Wt 190.0 lb

## 2019-12-14 DIAGNOSIS — I493 Ventricular premature depolarization: Secondary | ICD-10-CM

## 2019-12-14 DIAGNOSIS — R0789 Other chest pain: Secondary | ICD-10-CM | POA: Diagnosis not present

## 2019-12-14 DIAGNOSIS — I10 Essential (primary) hypertension: Secondary | ICD-10-CM | POA: Diagnosis not present

## 2019-12-14 NOTE — Telephone Encounter (Signed)
Patient is scheduled for lab study on 12/29/19 8pm. patient is scheduled for COVID screening on 12/26/19 @12 :30 prior to titration.  Patient understands her sleep study will be done at [facility] sleep lab. Patient understands she will receive a sleep packet in a week or so. Patient understands to call if she does not receive the sleep packet in a timely manner. Patient agrees with treatment and thanked me for call.

## 2019-12-14 NOTE — Telephone Encounter (Signed)
Update echo prior to PVC ablation
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
## 2019-12-14 NOTE — Telephone Encounter (Signed)
-----   Message from Thompson Grayer, MD sent at 12/14/2019 12:31 PM EST ----- Schedule PVC ablation [place], ice, anesthesia

## 2019-12-14 NOTE — Telephone Encounter (Signed)
-----   Message from Lauralee Evener, [place] sent at 12/12/2019  2:26 PM EST ----- Regarding: RE: precert Per [facility] web portal no PA is required. Ok to schedule CPAP titration. Decision ID  [id number]. ----- Message ----- From: Freada Bergeron, CMA Sent: 12/12/2019  12:39 PM EST To: Cv Div Sleep Studies Subject: precert                                         CPAP titration

## 2019-12-17 DIAGNOSIS — M25512 Pain in left shoulder: Secondary | ICD-10-CM | POA: Diagnosis not present

## 2019-12-18 DIAGNOSIS — M75101 Unspecified rotator cuff tear or rupture of right shoulder, not specified as traumatic: Secondary | ICD-10-CM | POA: Diagnosis not present

## 2019-12-19 ENCOUNTER — Other Ambulatory Visit: Payer: Self-pay | Admitting: Orthopedic Surgery

## 2019-12-20 ENCOUNTER — Other Ambulatory Visit (HOSPITAL_COMMUNITY): Payer: Medicare Other

## 2019-12-20 NOTE — Telephone Encounter (Signed)
Pt scheduled for PVC ablation on January 18, 2020 at 10:30 am.   Labs/covid test scheduled.  Instruction letter sent via mychart.  Work up complete.  Will also mail instructions per Pt request.

## 2019-12-21 ENCOUNTER — Encounter: Payer: Medicare Other | Admitting: Family Medicine

## 2019-12-21 MED ORDER — REPATHA SURECLICK 140 MG/ML ~~LOC~~ SOAJ: 1.0000 | SUBCUTANEOUS | 11 refills | Status: DC

## 2019-12-24 NOTE — Anesthesia Preprocedure Evaluation (Addendum)
Anesthesia Evaluation  Patient identified by MRN, date of birth, ID band Patient awake    Reviewed: Allergy & Precautions, H&P , NPO status , Patient's Chart, lab work & pertinent test results  Airway Mallampati: II  TM Distance: >3 FB Neck ROM: full    Dental no notable dental hx.    Pulmonary asthma , sleep apnea , COPD, former smoker,    Pulmonary exam normal breath sounds clear to auscultation       Cardiovascular hypertension, Normal cardiovascular exam+ dysrhythmias (Symptomatic PVCs, scheduled to undergo ablation)  Rhythm:regular Rate:Normal   HLD   Neuro/Psych PSYCHIATRIC DISORDERS Anxiety Depression  Fibromyalgia    GI/Hepatic GERD  , IBS Barrett's esophagus   Endo/Other  diabetes (Pre-DM)Hypothyroidism   Renal/GU CRFRenal disease (Stones)     Musculoskeletal  (+) Arthritis , Osteoarthritis,  Fibromyalgia -  Abdominal   Peds  Hematology   Anesthesia Other Findings Raynaud's  Reproductive/Obstetrics                            Anesthesia Physical Anesthesia Plan  ASA: III  Anesthesia Plan: General   Post-op Pain Management:    Induction: Intravenous  PONV Risk Score and Plan: 3 and Treatment may vary due to age or medical condition, Ondansetron, Midazolam and Propofol infusion  Airway Management Planned: LMA  Additional Equipment:   Intra-op Plan:   Post-operative Plan: Extubation in OR  Informed Consent: I have reviewed the patients History and Physical, chart, labs and discussed the procedure including the risks, benefits and alternatives for the proposed anesthesia with the patient or authorized representative who has indicated his/her understanding and acceptance.     Dental Advisory Given  Plan Discussed with: CRNA and Anesthesiologist  Anesthesia Plan Comments:        Anesthesia Quick Evaluation

## 2019-12-26 ENCOUNTER — Other Ambulatory Visit (HOSPITAL_COMMUNITY)
Admission: RE | Admit: 2019-12-26 | Discharge: 2019-12-26 | Disposition: A | Discharge status: Home / Self Care | Payer: Medicare Other | Source: Ambulatory Visit | Attending: Cardiology | Admitting: Cardiology

## 2019-12-26 DIAGNOSIS — Z20822 Contact with and (suspected) exposure to covid-19: Secondary | ICD-10-CM | POA: Diagnosis not present

## 2019-12-26 DIAGNOSIS — Z01812 Encounter for preprocedural laboratory examination: Secondary | ICD-10-CM | POA: Diagnosis not present

## 2019-12-26 LAB — SARS CORONAVIRUS 2 (TAT 6-24 HRS): SARS Coronavirus 2: NEGATIVE

## 2019-12-27 ENCOUNTER — Other Ambulatory Visit: Admission: RE | Admit: 2019-12-27 | Payer: Medicare Other | Source: Ambulatory Visit

## 2019-12-29 ENCOUNTER — Ambulatory Visit (HOSPITAL_BASED_OUTPATIENT_CLINIC_OR_DEPARTMENT_OTHER): Payer: Medicare Other | Attending: Cardiology | Admitting: Cardiology

## 2019-12-29 DIAGNOSIS — Z79899 Other long term (current) drug therapy: Secondary | ICD-10-CM | POA: Diagnosis not present

## 2019-12-29 DIAGNOSIS — G4733 Obstructive sleep apnea (adult) (pediatric): Secondary | ICD-10-CM

## 2019-12-30 NOTE — Procedures (Signed)
    Patient Name: Laura [name], [place] Date: 12/29/2019 Gender: Female D.O.B: October 20, 1966 Age (years): 67 Referring Provider: Fransico Him MD, ABSM Height (inches): 65 Interpreting Physician: Fransico Him MD, ABSM Weight (lbs): 190 RPSGT: Earney [place] BMI: 32 MRN: [id number] Neck Size: 16.00  CLINICAL INFORMATION The patient is referred for a CPAP titration to treat sleep apnea.  SLEEP STUDY TECHNIQUE As per the AASM Manual for the Scoring of Sleep and Associated Events v2.3 (April 2016) with a hypopnea requiring 4% desaturations.  The channels recorded and monitored were frontal, central and occipital EEG, electrooculogram (EOG), submentalis EMG (chin), nasal and oral airflow, thoracic and abdominal wall motion, anterior tibialis EMG, snore microphone, electrocardiogram, and pulse oximetry. Continuous positive airway pressure (CPAP) was initiated at the beginning of the study and titrated to treat sleep-disordered breathing.  MEDICATIONS Medications self-administered by patient taken the night of the study : PROTONIX, ZANAFLEX, QUINAPRIL, AMBIEN, flexainide  TECHNICIAN COMMENTS Comments added by technician: NONE Comments added by scorer: N/A  RESPIRATORY PARAMETERS Optimal PAP Pressure (cm): 10  AHI at Optimal Pressure (/hr):0.0 Overall Minimal O2 (%):89.0  Supine % at Optimal Pressure (%):53 Minimal O2 at Optimal Pressure (%): 90.0   SLEEP ARCHITECTURE The study was initiated at 10:01:26 PM and ended at 4:38:29 AM.  Sleep onset time was 19.4 minutes and the sleep efficiency was 83.2%. The total sleep time was 330.2 minutes.  The patient spent 2.3% of the night in stage N1 sleep, 85.0% in stage N2 sleep, 0.0% in stage N3 and 12.7% in REM.Stage REM latency was 157.0 minutes  Wake after sleep onset was 47.5. Alpha intrusion was absent. Supine sleep was 62.39%.  CARDIAC DATA The 2 lead EKG demonstrated sinus rhythm. The mean heart rate was 55.7 beats per minute. Other  EKG findings include: PVCs.  LEG MOVEMENT DATA The total Periodic Limb Movements of Sleep (PLMS) were 0. The PLMS index was 0.0. A PLMS index of <15 is considered normal in adults.  IMPRESSIONS - The optimal PAP pressure was 10 cm of water. - Central sleep apnea was not noted during this titration (CAI = 0.0/h). - Mild oxygen desaturations were observed during this titration (min O2 = 89.0%). - The patient snored with moderate snoring volume during this titration study. - 2-lead EKG demonstrated: PVCs - Clinically significant periodic limb movements were not noted during this study. Arousals associated with PLMs were rare.  DIAGNOSIS - Obstructive Sleep Apnea (327.23 [G47.33 ICD-10])  RECOMMENDATIONS - Trial of CPAP therapy on 10 cm H2O with a Medium size Philips Respironics Full Face Mask Amara View mask and heated humidification. - Avoid alcohol, sedatives and other CNS depressants that may worsen sleep apnea and disrupt normal sleep architecture. - Sleep hygiene should be reviewed to assess factors that may improve sleep quality. - Weight management and regular exercise should be initiated or continued. - Return to Sleep Center for re-evaluation after 10 weeks of therapy  [Electronically signed] 12/30/2019 03:29 PM  Fransico Him MD, ABSM Diplomate, American Board of Sleep Medicine

## 2019-12-31 ENCOUNTER — Telehealth: Payer: Self-pay | Admitting: *Deleted

## 2019-12-31 NOTE — Telephone Encounter (Signed)
Informed patient of sleep study results and patient understanding was verbalized. Patient understands her sleep study showed they had a successful PAP titration. Pt is aware and agreeable to her results. Upon patient request DME selection is [facility]. Patient understands she will be contacted by [facility]  to set up her cpap. Patient understands to call if Rochelle does not contact him with new setup in a timely manner. Patient understands they will be called once confirmation has been received from [facility] that they have received their new machine to schedule 10 week follow up appointment.  [facility] notified of new cpap order  Please add to airview Patient was grateful for the call and thanked me.

## 2019-12-31 NOTE — Telephone Encounter (Signed)
-----   Message from Sueanne Margarita, MD sent at 12/30/2019  3:35 PM EST ----- Please let patient know that they had a successful PAP titration and let DME know that orders are in EPIC.  Please set up 10 week OV with me.

## 2020-01-07 ENCOUNTER — Other Ambulatory Visit: Payer: Self-pay

## 2020-01-07 ENCOUNTER — Ambulatory Visit (HOSPITAL_COMMUNITY): Payer: Medicare Other | Attending: Cardiology

## 2020-01-07 DIAGNOSIS — N189 Chronic kidney disease, unspecified: Secondary | ICD-10-CM | POA: Insufficient documentation

## 2020-01-07 DIAGNOSIS — I351 Nonrheumatic aortic (valve) insufficiency: Secondary | ICD-10-CM | POA: Diagnosis not present

## 2020-01-07 DIAGNOSIS — I340 Nonrheumatic mitral (valve) insufficiency: Secondary | ICD-10-CM

## 2020-01-07 DIAGNOSIS — I071 Rheumatic tricuspid insufficiency: Secondary | ICD-10-CM

## 2020-01-07 DIAGNOSIS — E669 Obesity, unspecified: Secondary | ICD-10-CM | POA: Diagnosis not present

## 2020-01-07 DIAGNOSIS — Z87891 Personal history of nicotine dependence: Secondary | ICD-10-CM | POA: Diagnosis not present

## 2020-01-07 DIAGNOSIS — I1310 Hypertensive heart and chronic kidney disease without heart failure, with stage 1 through stage 4 chronic kidney disease, or unspecified chronic kidney disease: Secondary | ICD-10-CM | POA: Diagnosis not present

## 2020-01-07 DIAGNOSIS — G4730 Sleep apnea, unspecified: Secondary | ICD-10-CM | POA: Diagnosis not present

## 2020-01-07 DIAGNOSIS — I493 Ventricular premature depolarization: Secondary | ICD-10-CM | POA: Diagnosis not present

## 2020-01-07 DIAGNOSIS — E785 Hyperlipidemia, unspecified: Secondary | ICD-10-CM | POA: Insufficient documentation

## 2020-01-15 ENCOUNTER — Telehealth: Payer: Self-pay | Admitting: Internal Medicine

## 2020-01-15 ENCOUNTER — Other Ambulatory Visit (HOSPITAL_COMMUNITY): Payer: Medicare Other

## 2020-01-15 ENCOUNTER — Other Ambulatory Visit
Admission: RE | Admit: 2020-01-15 | Discharge: 2020-01-15 | Disposition: A | Discharge status: Home / Self Care | Payer: Medicare Other | Source: Ambulatory Visit | Attending: Internal Medicine | Admitting: Internal Medicine

## 2020-01-15 ENCOUNTER — Other Ambulatory Visit: Payer: Medicare Other

## 2020-01-15 DIAGNOSIS — Z01812 Encounter for preprocedural laboratory examination: Secondary | ICD-10-CM | POA: Diagnosis not present

## 2020-01-15 DIAGNOSIS — I493 Ventricular premature depolarization: Secondary | ICD-10-CM

## 2020-01-15 DIAGNOSIS — Z20822 Contact with and (suspected) exposure to covid-19: Secondary | ICD-10-CM | POA: Diagnosis not present

## 2020-01-15 LAB — SARS CORONAVIRUS 2 (TAT 6-24 HRS): SARS Coronavirus 2: NEGATIVE

## 2020-01-15 NOTE — Telephone Encounter (Signed)
Faxed lab orders to [facility] in [place] for Pt.

## 2020-01-15 NOTE — Telephone Encounter (Signed)
Pt called and stated she did not have orders in to get pre-procedure labs. Please advise

## 2020-01-16 ENCOUNTER — Other Ambulatory Visit: Payer: Self-pay | Admitting: Internal Medicine

## 2020-01-16 DIAGNOSIS — I493 Ventricular premature depolarization: Secondary | ICD-10-CM | POA: Diagnosis not present

## 2020-01-17 ENCOUNTER — Ambulatory Visit: Payer: Medicare Other | Admitting: Internal Medicine

## 2020-01-17 LAB — BASIC METABOLIC PANEL
BUN/Creatinine Ratio: 23 (ref 9–23)
BUN: 19 mg/dL (ref 6–24)
CO2: 24 mmol/L (ref 20–29)
Calcium: 10.1 mg/dL (ref 8.7–10.2)
Chloride: 101 mmol/L (ref 96–106)
Creatinine, Ser: 0.82 mg/dL (ref 0.57–1.00)
GFR calc Af Amer: 94 mL/min/{1.73_m2} (ref >59)
GFR calc non Af Amer: 81 mL/min/{1.73_m2} (ref >59)
Glucose: 103 mg/dL — ABNORMAL HIGH (ref 65–99)
Potassium: 4.8 mmol/L (ref 3.5–5.2)
Sodium: 141 mmol/L (ref 134–144)

## 2020-01-17 LAB — CBC WITH DIFFERENTIAL/PLATELET
Basophils Absolute: 0 10*3/uL (ref 0.0–0.2)
Basos: 0 %
EOS (ABSOLUTE): 0.1 10*3/uL (ref 0.0–0.4)
Eos: 2 %
Hematocrit: 45.5 % (ref 34.0–46.6)
Hemoglobin: 14.9 g/dL (ref 11.1–15.9)
Immature Grans (Abs): 0 10*3/uL (ref 0.0–0.1)
Immature Granulocytes: 0 %
Lymphocytes Absolute: 1.9 10*3/uL (ref 0.7–3.1)
Lymphs: 28 %
MCH: 28.5 pg (ref 26.6–33.0)
MCHC: 32.7 g/dL (ref 31.5–35.7)
MCV: 87 fL (ref 79–97)
Monocytes Absolute: 0.5 10*3/uL (ref 0.1–0.9)
Monocytes: 7 %
Neutrophils Absolute: 4.3 10*3/uL (ref 1.4–7.0)
Neutrophils: 63 %
Platelets: 362 10*3/uL (ref 150–450)
RBC: 5.23 x10E6/uL (ref 3.77–5.28)
RDW: 13.1 % (ref 11.7–15.4)
WBC: 6.9 10*3/uL (ref 3.4–10.8)

## 2020-01-17 LAB — SPECIMEN STATUS REPORT

## 2020-01-17 NOTE — Progress Notes (Signed)
Instructed patient on the following items: Arrival time 0830 Nothing to eat or drink after midnight No meds AM of procedure Responsible person to drive you home and stay with you for 24 hrs    

## 2020-01-18 ENCOUNTER — Encounter (HOSPITAL_COMMUNITY)
Admission: RE | Disposition: A | Discharge status: Home / Self Care | Payer: Medicare Other | Source: Ambulatory Visit | Attending: Internal Medicine

## 2020-01-18 ENCOUNTER — Other Ambulatory Visit: Payer: Self-pay

## 2020-01-18 ENCOUNTER — Ambulatory Visit (HOSPITAL_COMMUNITY): Payer: Medicare Other | Admitting: Anesthesiology

## 2020-01-18 ENCOUNTER — Encounter (HOSPITAL_COMMUNITY): Payer: Self-pay | Admitting: Internal Medicine

## 2020-01-18 ENCOUNTER — Telehealth: Payer: Self-pay | Admitting: Internal Medicine

## 2020-01-18 ENCOUNTER — Ambulatory Visit (HOSPITAL_COMMUNITY)
Admission: RE | Admit: 2020-01-18 | Discharge: 2020-01-18 | Disposition: A | Discharge status: Home / Self Care | Payer: Medicare Other | Source: Ambulatory Visit | Attending: Internal Medicine | Admitting: Internal Medicine

## 2020-01-18 DIAGNOSIS — Z87891 Personal history of nicotine dependence: Secondary | ICD-10-CM | POA: Diagnosis not present

## 2020-01-18 DIAGNOSIS — R0789 Other chest pain: Secondary | ICD-10-CM | POA: Insufficient documentation

## 2020-01-18 DIAGNOSIS — Z79890 Hormone replacement therapy (postmenopausal): Secondary | ICD-10-CM | POA: Insufficient documentation

## 2020-01-18 DIAGNOSIS — I10 Essential (primary) hypertension: Secondary | ICD-10-CM | POA: Insufficient documentation

## 2020-01-18 DIAGNOSIS — F329 Major depressive disorder, single episode, unspecified: Secondary | ICD-10-CM | POA: Insufficient documentation

## 2020-01-18 DIAGNOSIS — G8929 Other chronic pain: Secondary | ICD-10-CM | POA: Insufficient documentation

## 2020-01-18 DIAGNOSIS — Z888 Allergy status to other drugs, medicaments and biological substances status: Secondary | ICD-10-CM | POA: Diagnosis not present

## 2020-01-18 DIAGNOSIS — I7300 Raynaud's syndrome without gangrene: Secondary | ICD-10-CM | POA: Insufficient documentation

## 2020-01-18 DIAGNOSIS — N189 Chronic kidney disease, unspecified: Secondary | ICD-10-CM | POA: Diagnosis not present

## 2020-01-18 DIAGNOSIS — F419 Anxiety disorder, unspecified: Secondary | ICD-10-CM | POA: Diagnosis not present

## 2020-01-18 DIAGNOSIS — R7303 Prediabetes: Secondary | ICD-10-CM | POA: Diagnosis not present

## 2020-01-18 DIAGNOSIS — M797 Fibromyalgia: Secondary | ICD-10-CM | POA: Diagnosis not present

## 2020-01-18 DIAGNOSIS — K219 Gastro-esophageal reflux disease without esophagitis: Secondary | ICD-10-CM | POA: Insufficient documentation

## 2020-01-18 DIAGNOSIS — K589 Irritable bowel syndrome without diarrhea: Secondary | ICD-10-CM | POA: Diagnosis not present

## 2020-01-18 DIAGNOSIS — M1990 Unspecified osteoarthritis, unspecified site: Secondary | ICD-10-CM | POA: Diagnosis not present

## 2020-01-18 DIAGNOSIS — I493 Ventricular premature depolarization: Secondary | ICD-10-CM | POA: Diagnosis not present

## 2020-01-18 DIAGNOSIS — Z79899 Other long term (current) drug therapy: Secondary | ICD-10-CM | POA: Diagnosis not present

## 2020-01-18 DIAGNOSIS — E7800 Pure hypercholesterolemia, unspecified: Secondary | ICD-10-CM | POA: Diagnosis not present

## 2020-01-18 DIAGNOSIS — I129 Hypertensive chronic kidney disease with stage 1 through stage 4 chronic kidney disease, or unspecified chronic kidney disease: Secondary | ICD-10-CM | POA: Diagnosis not present

## 2020-01-18 DIAGNOSIS — J449 Chronic obstructive pulmonary disease, unspecified: Secondary | ICD-10-CM | POA: Insufficient documentation

## 2020-01-18 DIAGNOSIS — G4730 Sleep apnea, unspecified: Secondary | ICD-10-CM | POA: Diagnosis not present

## 2020-01-18 DIAGNOSIS — Z885 Allergy status to narcotic agent status: Secondary | ICD-10-CM | POA: Diagnosis not present

## 2020-01-18 HISTORY — PX: PVC ABLATION: EP1236

## 2020-01-18 SURGERY — PVC ABLATION
Anesthesia: Monitor Anesthesia Care

## 2020-01-18 MED ORDER — SODIUM CHLORIDE 0.9 % IV SOLN: INTRAVENOUS | Status: DC

## 2020-01-18 MED ORDER — ISOPROTERENOL HCL 0.2 MG/ML IJ SOLN
INTRAVENOUS | Status: DC | PRN
  Administered 2020-01-18: 11:00:00 10 ug/min via INTRAVENOUS

## 2020-01-18 MED ORDER — ISOPROTERENOL HCL 0.2 MG/ML IJ SOLN
INTRAMUSCULAR | Status: AC
  Filled 2020-01-18: qty 5

## 2020-01-18 SURGICAL SUPPLY — 1 items: PATCH CARTO3 (PAD) ×1 IMPLANT

## 2020-01-18 NOTE — Telephone Encounter (Signed)
New message   Pt said that Dr. Rayann Heman was unable to complete her ablation today. She wondered if she needed to RS that procedure or what was to happen? She said she could be called or contacted through [place] [place]

## 2020-01-18 NOTE — Anesthesia Procedure Notes (Signed)
Procedure Name: MAC Date/Time: 01/18/2020 11:08 AM Performed by: Jenne Campus, CRNA Pre-anesthesia Checklist: Patient identified, Emergency Drugs available, Suction available and Patient being monitored Oxygen Delivery Method: Simple face mask

## 2020-01-18 NOTE — Discharge Instructions (Signed)
Post procedure care instructions: call Dr Jackalyn Lombard office if any problems, questions, or concerns
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
## 2020-01-18 NOTE — Transfer of Care (Signed)
Immediate Anesthesia Transfer of Care Note  Patient: Laura [name]  Procedure(s) Performed: PVC ABLATION (N/A )  Patient Location: Short Stay  Anesthesia Type:MAC  Level of Consciousness: awake, alert , oriented and patient cooperative  Airway & Oxygen Therapy: Patient Spontanous Breathing  Post-op Assessment: Report given to RN and Post -op Vital signs reviewed and stable  Post vital signs: Reviewed  Last Vitals:  Vitals Value Taken Time  BP    Temp    Pulse    Resp    SpO2      Last Pain:  Vitals:   01/18/20 1011  TempSrc:   PainSc: 7       Patients Stated Pain Goal: 7 (19/75/88 3254)  Complications: No apparent anesthesia complications

## 2020-01-18 NOTE — Anesthesia Preprocedure Evaluation (Addendum)
Anesthesia Evaluation  Patient identified by MRN, date of birth, ID band Patient awake    Reviewed: Allergy & Precautions, H&P , NPO status , Patient's Chart, lab work & pertinent test results  Airway Mallampati: II   Neck ROM: full    Dental  (+) Teeth Intact, Dental Advisory Given   Pulmonary asthma , sleep apnea , COPD, former smoker,    breath sounds clear to auscultation       Cardiovascular hypertension, + dysrhythmias  Rhythm:regular Rate:Normal     Neuro/Psych PSYCHIATRIC DISORDERS Anxiety Depression  Neuromuscular disease    GI/Hepatic GERD  Medicated and Controlled,  Endo/Other  Hypothyroidism obese  Renal/GU Renal InsufficiencyRenal diseasestones     Musculoskeletal  (+) Arthritis , Fibromyalgia -  Abdominal   Peds  Hematology   Anesthesia Other Findings   Reproductive/Obstetrics                            Anesthesia Physical Anesthesia Plan  ASA: II  Anesthesia Plan: General   Post-op Pain Management:    Induction: Intravenous  PONV Risk Score and Plan: 3 and Ondansetron, Dexamethasone, Midazolam and Treatment may vary due to age or medical condition  Airway Management Planned: Oral ETT  Additional Equipment:   Intra-op Plan:   Post-operative Plan: Extubation in OR  Informed Consent: I have reviewed the patients History and Physical, chart, labs and discussed the procedure including the risks, benefits and alternatives for the proposed anesthesia with the patient or authorized representative who has indicated his/her understanding and acceptance.       Plan Discussed with: CRNA, Anesthesiologist and Surgeon  Anesthesia Plan Comments:         Anesthesia Quick Evaluation

## 2020-01-21 ENCOUNTER — Encounter: Payer: Self-pay | Admitting: Anesthesiology

## 2020-01-21 NOTE — Anesthesia Postprocedure Evaluation (Signed)
Anesthesia Post Note  Patient: Laura [name]  Procedure(s) Performed: PVC ABLATION (N/A )     Patient location during evaluation: PACU Anesthesia Type: MAC Level of consciousness: awake and alert Pain management: pain level controlled Vital Signs Assessment: post-procedure vital signs reviewed and stable Respiratory status: spontaneous breathing, nonlabored ventilation, respiratory function stable and patient connected to nasal cannula oxygen Cardiovascular status: stable and blood pressure returned to baseline Postop Assessment: no apparent nausea or vomiting Anesthetic complications: no    Last Vitals:  Vitals:   01/18/20 1215 01/18/20 1235  BP: 138/82 133/82  Pulse: 75 70  Resp:    Temp:    SpO2: 96% 97%    Last Pain:  Vitals:   01/18/20 1011  TempSrc:   PainSc: [place]

## 2020-01-23 ENCOUNTER — Telehealth: Payer: Self-pay | Admitting: *Deleted

## 2020-01-23 ENCOUNTER — Telehealth: Payer: Self-pay | Admitting: General Practice

## 2020-01-23 NOTE — Telephone Encounter (Signed)
   Primary Cardiologist: Kathlyn [place], MD  Chart reviewed as part of pre-operative protocol coverage. Given past medical history and time since last visit, based on ACC/AHA guidelines, Laura [name] would be at acceptable risk for the planned procedure without further cardiovascular testing.   Her RCRI is a class I risk, 0.4% risk of major cardiac event.  She is able to complete more than 4 METS of physical activity.  I will route this recommendation to the requesting party via Epic fax function and remove from pre-op pool.  Please call with questions.  Jossie Ng. [facility] [address] Office [phone] Fax [phone]

## 2020-01-23 NOTE — Telephone Encounter (Signed)
    [facility] Pre-operative Risk Assessment    Request for surgical clearance:  1. What type of surgery is being performed?  RT KNEE ARTHROSCOPY   2. When is this surgery scheduled?  02/04/20   3. What type of clearance is required (medical clearance vs. Pharmacy clearance to hold med vs. Both)?  MEDICAL  4. Are there any medications that need to be held prior to surgery and how long? N/A   5. Practice name and name of physician performing surgery?  [facility] / DR. Harlow Mares   6. What is your office phone number [phone] EX: [address] [facility])    7.   What is your office fax number [phone]  8.   Anesthesia type (None, local, MAC, general) ?  GENERAL   Laura [name] 01/23/2020, 9:25 AM  _________________________________________________________________   (provider comments below)

## 2020-01-23 NOTE — Telephone Encounter (Signed)
Elizabeth with [facility] states she is calling to follow up in regards to preop clearance faxed to the office on yesterday, 01/22/20. She states she would like to inquire about whether or not the patient is cleared for the procedure without the ablation as well as whether Dr. Rayann Heman recommends that the patient takes an anticoagulant.  Please return call to [place] at [phone]. (ext #: [phone]).

## 2020-01-24 ENCOUNTER — Other Ambulatory Visit: Payer: Self-pay

## 2020-01-24 ENCOUNTER — Ambulatory Visit: Payer: Medicare Other | Attending: Internal Medicine

## 2020-01-24 ENCOUNTER — Encounter: Payer: Self-pay | Admitting: Orthopedic Surgery

## 2020-01-24 DIAGNOSIS — Z23 Encounter for immunization: Secondary | ICD-10-CM

## 2020-01-24 NOTE — Progress Notes (Signed)
   Covid-19 Vaccination Clinic  Name:  Laura [name]    MRN: [id number] DOB: 1966-01-07  01/24/2020   [name] was observed post Covid-19 immunization for 30 minutes based on pre-vaccination screening without incident. She was provided with Vaccine Information Sheet and instruction to access the V-Safe system.   [name] was instructed to call 911 with any severe reactions post vaccine: Marland Kitchen Difficulty breathing  . Swelling of face and throat  . A fast heartbeat  . A bad rash all over body  . Dizziness and weakness   Immunizations Administered    Name Date Dose VIS Date Route   Pfizer COVID-19 Vaccine 01/24/2020  1:40 PM 0.3 mL 10/12/2019 Intramuscular   Manufacturer: [place]   Lot: [id number]   [place]: [id number]

## 2020-01-25 DIAGNOSIS — I10 Essential (primary) hypertension: Secondary | ICD-10-CM | POA: Diagnosis not present

## 2020-01-25 DIAGNOSIS — G4733 Obstructive sleep apnea (adult) (pediatric): Secondary | ICD-10-CM | POA: Diagnosis not present

## 2020-01-25 NOTE — Telephone Encounter (Signed)
I s/w Laura [name] with [facility] in regards to pre op clearance. Artiga states she did not receive the clearance that was faxed over on 01/23/20. She said she only received the clearance form, not the actual clearance. I assured I will send over both the clearance form and the clearance note. If she still has questions please feel free to call the office. I faxed notes to [phone].

## 2020-01-28 ENCOUNTER — Inpatient Hospital Stay: Admission: RE | Admit: 2020-01-28 | Payer: Medicare Other | Source: Ambulatory Visit

## 2020-01-29 ENCOUNTER — Other Ambulatory Visit: Payer: Self-pay

## 2020-01-29 MED ORDER — QUINAPRIL HCL 20 MG PO TABS: 20.0000 mg | ORAL_TABLET | Freq: Two times a day (BID) | ORAL | 3 refills | Status: DC

## 2020-01-30 ENCOUNTER — Other Ambulatory Visit: Payer: Self-pay | Admitting: Orthopedic Surgery

## 2020-01-31 ENCOUNTER — Telehealth: Payer: Self-pay | Admitting: *Deleted

## 2020-01-31 ENCOUNTER — Other Ambulatory Visit: Payer: Self-pay

## 2020-01-31 ENCOUNTER — Other Ambulatory Visit
Admission: RE | Admit: 2020-01-31 | Discharge: 2020-01-31 | Disposition: A | Discharge status: Home / Self Care | Payer: Medicare Other | Source: Ambulatory Visit | Attending: Orthopedic Surgery | Admitting: Orthopedic Surgery

## 2020-01-31 DIAGNOSIS — Z20822 Contact with and (suspected) exposure to covid-19: Secondary | ICD-10-CM | POA: Insufficient documentation

## 2020-01-31 DIAGNOSIS — Z01812 Encounter for preprocedural laboratory examination: Secondary | ICD-10-CM | POA: Insufficient documentation

## 2020-01-31 DIAGNOSIS — G4733 Obstructive sleep apnea (adult) (pediatric): Secondary | ICD-10-CM

## 2020-01-31 NOTE — Telephone Encounter (Signed)
Please set patient up for BiPAP titration in sleep lab ASAP - she is intolerant to BiPAP   Traci

## 2020-01-31 NOTE — Telephone Encounter (Signed)
Order has been sent to sleep pool.

## 2020-02-01 LAB — SARS CORONAVIRUS 2 (TAT 6-24 HRS): SARS Coronavirus 2: NEGATIVE

## 2020-02-03 NOTE — Telephone Encounter (Signed)
Unfortunately, she did not have very many PVCs and therefore not amenable to ablation.  She has scheduled follow-up with Dr Caryl Comes in 2 weeks to discuss other options.

## 2020-02-04 ENCOUNTER — Encounter
Admission: RE | Disposition: A | Discharge status: Home / Self Care | Payer: Self-pay | Source: Ambulatory Visit | Attending: Orthopedic Surgery

## 2020-02-04 ENCOUNTER — Encounter: Payer: Self-pay | Admitting: Orthopedic Surgery

## 2020-02-04 ENCOUNTER — Ambulatory Visit: Payer: Medicare Other | Admitting: Anesthesiology

## 2020-02-04 ENCOUNTER — Ambulatory Visit
Admission: RE | Admit: 2020-02-04 | Discharge: 2020-02-04 | Disposition: A | Discharge status: Home / Self Care | Payer: Medicare Other | Source: Ambulatory Visit | Attending: Orthopedic Surgery | Admitting: Orthopedic Surgery

## 2020-02-04 ENCOUNTER — Other Ambulatory Visit: Payer: Self-pay

## 2020-02-04 DIAGNOSIS — S83231A Complex tear of medial meniscus, current injury, right knee, initial encounter: Secondary | ICD-10-CM | POA: Diagnosis not present

## 2020-02-04 DIAGNOSIS — R7303 Prediabetes: Secondary | ICD-10-CM | POA: Diagnosis not present

## 2020-02-04 DIAGNOSIS — G4730 Sleep apnea, unspecified: Secondary | ICD-10-CM | POA: Insufficient documentation

## 2020-02-04 DIAGNOSIS — M94261 Chondromalacia, right knee: Secondary | ICD-10-CM | POA: Diagnosis not present

## 2020-02-04 DIAGNOSIS — J449 Chronic obstructive pulmonary disease, unspecified: Secondary | ICD-10-CM | POA: Diagnosis not present

## 2020-02-04 DIAGNOSIS — I10 Essential (primary) hypertension: Secondary | ICD-10-CM | POA: Diagnosis not present

## 2020-02-04 DIAGNOSIS — I7300 Raynaud's syndrome without gangrene: Secondary | ICD-10-CM | POA: Insufficient documentation

## 2020-02-04 DIAGNOSIS — M797 Fibromyalgia: Secondary | ICD-10-CM | POA: Diagnosis not present

## 2020-02-04 DIAGNOSIS — Z87891 Personal history of nicotine dependence: Secondary | ICD-10-CM | POA: Insufficient documentation

## 2020-02-04 DIAGNOSIS — S83281A Other tear of lateral meniscus, current injury, right knee, initial encounter: Secondary | ICD-10-CM | POA: Diagnosis not present

## 2020-02-04 DIAGNOSIS — X58XXXA Exposure to other specified factors, initial encounter: Secondary | ICD-10-CM | POA: Diagnosis not present

## 2020-02-04 DIAGNOSIS — S83241A Other tear of medial meniscus, current injury, right knee, initial encounter: Secondary | ICD-10-CM | POA: Diagnosis not present

## 2020-02-04 HISTORY — PX: KNEE ARTHROSCOPY WITH MEDIAL MENISECTOMY: SHX5651

## 2020-02-04 HISTORY — DX: Unspecified rotator cuff tear or rupture of unspecified shoulder, not specified as traumatic: M75.100

## 2020-02-04 HISTORY — DX: Presence of external hearing-aid: Z97.4

## 2020-02-04 SURGERY — ARTHROSCOPY, KNEE, WITH MEDIAL MENISCECTOMY
Anesthesia: General | Site: Knee | Laterality: Right

## 2020-02-04 MED ORDER — LACTATED RINGERS IV SOLN
INTRAVENOUS | Status: DC | PRN
  Administered 2020-02-04: 2 mL

## 2020-02-04 MED ORDER — ONDANSETRON HCL 4 MG/2ML IJ SOLN
4.0000 mg | Freq: Once | INTRAMUSCULAR | Status: AC | PRN
  Administered 2020-02-04: 4 mg via INTRAVENOUS

## 2020-02-04 MED ORDER — FENTANYL CITRATE (PF) 100 MCG/2ML IJ SOLN
INTRAMUSCULAR | Status: DC | PRN
  Administered 2020-02-04 (×2): 50 ug via INTRAVENOUS

## 2020-02-04 MED ORDER — OXYCODONE HCL 5 MG/5ML PO SOLN: 5.0000 mg | Freq: Once | ORAL | Status: DC | PRN

## 2020-02-04 MED ORDER — LACTATED RINGERS IV SOLN: INTRAVENOUS | Status: DC

## 2020-02-04 MED ORDER — TRIAMCINOLONE ACETONIDE 40 MG/ML IJ SUSP
INTRAMUSCULAR | Status: DC | PRN
  Administered 2020-02-04: 40 mg

## 2020-02-04 MED ORDER — PROMETHAZINE HCL 25 MG/ML IJ SOLN: 6.2500 mg | INTRAMUSCULAR | Status: DC | PRN

## 2020-02-04 MED ORDER — OXYCODONE HCL 5 MG PO TABS: 5.0000 mg | ORAL_TABLET | Freq: Once | ORAL | Status: DC | PRN

## 2020-02-04 MED ORDER — LACTATED RINGERS IV SOLN
10.0000 mL/h | INTRAVENOUS | Status: DC
  Administered 2020-02-04: 10 mL/h via INTRAVENOUS

## 2020-02-04 MED ORDER — CEFAZOLIN SODIUM-DEXTROSE 2-4 GM/100ML-% IV SOLN
2.0000 g | INTRAVENOUS | Status: AC
  Administered 2020-02-04: 2 g via INTRAVENOUS

## 2020-02-04 MED ORDER — PROPOFOL 500 MG/50ML IV EMUL
INTRAVENOUS | Status: DC | PRN
  Administered 2020-02-04: 100 ug/kg/min via INTRAVENOUS

## 2020-02-04 MED ORDER — ACETAMINOPHEN 160 MG/5ML PO SOLN: 325.0000 mg | ORAL | Status: DC | PRN

## 2020-02-04 MED ORDER — ACETAMINOPHEN 325 MG PO TABS: 325.0000 mg | ORAL_TABLET | ORAL | Status: DC | PRN

## 2020-02-04 MED ORDER — MIDAZOLAM HCL 5 MG/5ML IJ SOLN
INTRAMUSCULAR | Status: DC | PRN
  Administered 2020-02-04: 2 mg via INTRAVENOUS

## 2020-02-04 MED ORDER — PROPOFOL 10 MG/ML IV BOLUS
INTRAVENOUS | Status: DC | PRN
  Administered 2020-02-04: 20 mg via INTRAVENOUS
  Administered 2020-02-04 (×2): 50 mg via INTRAVENOUS

## 2020-02-04 MED ORDER — LIDOCAINE HCL (CARDIAC) PF 100 MG/5ML IV SOSY
PREFILLED_SYRINGE | INTRAVENOUS | Status: DC | PRN
  Administered 2020-02-04: 20 mg via INTRATRACHEAL

## 2020-02-04 MED ORDER — BUPIVACAINE-EPINEPHRINE 0.5% -1:200000 IJ SOLN
INTRAMUSCULAR | Status: DC | PRN
  Administered 2020-02-04: 20 mL

## 2020-02-04 MED ORDER — ACETAMINOPHEN 10 MG/ML IV SOLN
1000.0000 mg | Freq: Once | INTRAVENOUS | Status: AC
  Administered 2020-02-04: 12:00:00 1000 mg via INTRAVENOUS

## 2020-02-04 MED ORDER — ROPIVACAINE HCL 5 MG/ML IJ SOLN
INTRAMUSCULAR | Status: DC | PRN
  Administered 2020-02-04: 20 mL

## 2020-02-04 MED ORDER — SODIUM CHLORIDE 0.9 % IV SOLN
800.0000 mg | Freq: Once | INTRAVENOUS | Status: AC
  Administered 2020-02-04: 13:00:00 800 mg via INTRAVENOUS

## 2020-02-04 MED ORDER — HYDROMORPHONE HCL 1 MG/ML IJ SOLN
0.2500 mg | INTRAMUSCULAR | Status: DC | PRN
  Administered 2020-02-04 (×2): 0.5 mg via INTRAVENOUS

## 2020-02-04 MED ORDER — HYDROMORPHONE HCL 2 MG PO TABS: 2.0000 mg | ORAL_TABLET | Freq: Four times a day (QID) | ORAL | 0 refills | Status: AC | PRN

## 2020-02-04 SURGICAL SUPPLY — 32 items
APL PRP STRL LF DISP 70% ISPRP (MISCELLANEOUS) ×1
BLADE FULL RADIUS 3.5 (BLADE) IMPLANT
BLADE SHAVER 4.5 DBL SERAT CV (CUTTER) ×1 IMPLANT
BLADE SURG SZ11 CARB STEEL (BLADE) ×2 IMPLANT
CHLORAPREP W/TINT 26 (MISCELLANEOUS) ×2 IMPLANT
COOLER POLAR GLACIER W/PUMP (MISCELLANEOUS) ×2 IMPLANT
COVER LIGHT HANDLE UNIVERSAL (MISCELLANEOUS) ×4 IMPLANT
DRAPE IMP U-DRAPE 54X76 (DRAPES) ×2 IMPLANT
GAUZE SPONGE 4X4 12PLY STRL (GAUZE/BANDAGES/DRESSINGS) ×2 IMPLANT
GLOVE INDICATOR 8.0 STRL GRN (GLOVE) ×3 IMPLANT
GLOVE SURG ORTHO 8.0 STRL STRW (GLOVE) ×3 IMPLANT
GOWN STRL REUS W/ TWL LRG LVL3 (GOWN DISPOSABLE) ×1 IMPLANT
GOWN STRL REUS W/TWL LRG LVL3 (GOWN DISPOSABLE) ×4
IV LACTATED RINGER IRRG 3000ML (IV SOLUTION) ×4
IV LR IRRIG 3000ML ARTHROMATIC (IV SOLUTION) ×2 IMPLANT
KIT TURNOVER KIT A (KITS) ×2 IMPLANT
MANIFOLD NEPTUNE II (INSTRUMENTS) ×2 IMPLANT
MAT ABSORB  FLUID 56X50 GRAY (MISCELLANEOUS) ×2
MAT ABSORB FLUID 56X50 GRAY (MISCELLANEOUS) ×1 IMPLANT
NDL 18GX1X1/2 (RX/OR ONLY) (NEEDLE) ×1 IMPLANT
NEEDLE 18GX1X1/2 (RX/OR ONLY) (NEEDLE) ×2 IMPLANT
PACK ARTHROSCOPY KNEE (MISCELLANEOUS) ×2 IMPLANT
PAD ABD DERMACEA PRESS 5X9 (GAUZE/BANDAGES/DRESSINGS) ×4 IMPLANT
PAD WRAPON POLAR KNEE (MISCELLANEOUS) ×1 IMPLANT
SCALPEL PROTECTED #11 DISP (BLADE) ×2 IMPLANT
SUT ETHILON 4-0 (SUTURE) ×2
SUT ETHILON 4-0 FS2 18XMFL BLK (SUTURE) ×1
SUTURE ETHLN 4-0 FS2 18XMF BLK (SUTURE) ×1 IMPLANT
SYR 10ML LL (SYRINGE) ×2 IMPLANT
TUBING ARTHRO INFLOW-ONLY STRL (TUBING) ×2 IMPLANT
WAND WEREWOLF FLOW 90D (MISCELLANEOUS) ×2 IMPLANT
WRAPON POLAR PAD KNEE (MISCELLANEOUS) ×2

## 2020-02-04 NOTE — Op Note (Signed)
PATIENT:  Laura [name]  PRE-OPERATIVE DIAGNOSIS:  S83.206A unspecified tear of unspecified meniscus current injury right knee  POST-OPERATIVE DIAGNOSIS:  Same  PROCEDURE:  RIGHT KNEE ARTHROSCOPY WITH PARTIAL LATERAL AND MEDIAL MENISECTOMY,  PARTIAL SYNOVECTOMY AND CHONDROPLASTY  SURGEON:  Kurtis Bushman, MD  ANESTHESIA:   General  PREOPERATIVE INDICATIONS:  Laura [name]  54 y.o. female with a diagnosis of S83.206A unspecified tear of unspecified meniscus current injury right knee who failed conservative management and elected for surgical management.    The risks benefits and alternatives were discussed with the patient preoperatively including the risks of infection, bleeding, nerve injury, knee stiffness, persistent pain, osteoarthritis and the need for further surgery. Medical  risks include DVT and pulmonary embolism, myocardial infarction, stroke, pneumonia, respiratory failure and death. The patient understood these risks and wished to proceed.   OPERATIVE FINDINGS: The suprapatellar pouch, medial and lateral gutters were inspected and found to be free of any loose bodies. The undersurface of the patella and landing zone were inspected and grade 3 and 4 chondromalacia was noted. There was mild evidence of lateral subluxation. The medial compartment showed a complex tear of the posterior horn, then anterior horn was intact and stable to probing. Grade 3 and 4 chondromalacia was identified in the medial compartment.  The notch was inspected and the ACL and PCL were intact and stable to probing. The lateral compartment was entered and minimal degenerative changes were identified. The lateral meniscus had degenerative tearing along the posterior horn. The anterior horn was intact a stable.  OPERATIVE PROCEDURE: Patient was met in the preoperative area. The operative extremity was signed with my initials according the hospital's correct site of surgery protocol.  The patient was brought to  the operating room where they was placed supine on the operative table. General anesthesia was administered. The patient was prepped and draped in a sterile fashion.  A timeout was performed to verify the patient's name, date of birth, medical record number, correct site of surgery correct procedure to be performed. It was also used to verify the patient received antibiotics that all appropriate instruments, and radiographic studies were available in the room. Once all in attendance were in agreement, the case began.  Proposed arthroscopy incisions were drawn out with a surgical marker. These were pre-injected with 0.5% marcaine with epinephrine. An 11 blade was used to establish an inferior lateral and inferomedial portals. The inferomedial portal was created using a 18-gauge spinal needle under direct visualization.  A full diagnostic examination of the knee was performed, please see findings for a complete list of results.  Patient had the meniscal tear treated with a 4-0 resector shaver blade and straight duckbill basket. The meniscus was debrided until a stable rim was achieved. A chondroplasty was performed in all three compartments using the shaver on reverse burr mode. A partial synovectomy was also performed in all three compartments using a 4-0 resector shaver blade and electrocautery.  The knee was then copiously lavaged. All arthroscopic instruments were removed. The 2 arthroscopy portals were closed with 4-0 nylon. A dry sterile and compressive dressing was applied. The patient was brought to the PACU in stable condition. I was scrubbed and present for the entire case and all sharp and instrument counts were correct at the conclusion the case. I spoke with the patient's family postoperatively to let them know the case was performed without complication and the patient was stable in the recovery room.  Kurtis Bushman, MD

## 2020-02-04 NOTE — Anesthesia Procedure Notes (Signed)
Procedure Name: MAC Date/Time: 02/04/2020 12:15 PM Performed by: Vanetta Shawl, CRNA Pre-anesthesia Checklist: Patient identified, Emergency Drugs available, Suction available, Timeout performed and Patient being monitored Patient Re-evaluated:Patient Re-evaluated prior to induction Oxygen Delivery Method: Nasal cannula Placement Confirmation: positive ETCO2

## 2020-02-04 NOTE — H&P (Signed)
The patient has been re-examined, and the chart reviewed, and there have been no interval changes to the documented history and physical.  Plan a right knee scope today.  Anesthesia is not consulted regarding a peripheral nerve block for post-operative pain.  The risks, benefits, and alternatives have been discussed at length, and the patient is willing to proceed.    

## 2020-02-04 NOTE — Anesthesia Postprocedure Evaluation (Signed)
Anesthesia Post Note  Patient: Laura [name]  Procedure(s) Performed: KNEE ARTHROSCOPY WITH PARTIAL LATERAL AND MEDIAL MENISECTOMY,  PARTIAL SYNOVECTOMY AND CHONDROPLASTY (Right Knee)     Patient location during evaluation: PACU Anesthesia Type: General Level of consciousness: awake and alert and oriented Pain management: satisfactory to patient Vital Signs Assessment: post-procedure vital signs reviewed and stable Respiratory status: spontaneous breathing, nonlabored ventilation and respiratory function stable Cardiovascular status: blood pressure returned to baseline and stable Postop Assessment: Adequate PO intake and No signs of nausea or vomiting Anesthetic complications: no    Raliegh Ip

## 2020-02-04 NOTE — Discharge Instructions (Signed)
Post Op Home Instructions for Knee Arthroscopy  1) Do not sit for longer than 1 hour at a time with your leg dangling down.  You should have your legs elevated (higher than your heart) in a recliner chair or couch.  2) You may be up walking around as tolerated but should take periodic breaks to elevate your legs.  Discontinue use of crutches when you feel you are able to walk without pain or a limp.  3) Work on gentle bending and straightening of the knee.  4) You may remove the Ace wrap and dressings two days after surgery.  Place band aids over the incision sites.  5) You may shower after you remove the surgical dressing.  You do not need to cover the incision with plastic wrap.  The incision can get wet, but do not submerge under water.  After your sutures have been removed, you should wait 24 hours before submerging incision under water.  6) Pain medication can cause constipation.  You should increase your fluid intake, increase your intake of high fiber foods and/or take Metamucil as needed for constipation.  7) Continue your physical therapy exercises, as shown at the office, at least twice daily.  You should set up outpatient physical therapy and start within the first week after surgery.  8) Continue to use your Polar Pack continuously for 2-3 days after surgery.  After you remove the surgical dressing, it is a good idea to use your Polar Pack or ice pack for 30 minutes after doing your exercises to reduce swelling.  9) Do not be surprised if you have increased pain at night.  This usually means you have been a little too active during the day and need to reduce your activities.  10) If you develop lower extremity swelling that does not improve after a night of elevation, please call the office.  This could be an early sign of a blood clot.  Please call with any questions at [phone]     General Anesthesia, Adult, Care After This sheet gives you information about how to care  for yourself after your procedure. Your health care provider may also give you more specific instructions. If you have problems or questions, contact your health care provider. What can I expect after the procedure? After the procedure, the following side effects are common:  Pain or discomfort at the IV site.  Nausea.  Vomiting.  Sore throat.  Trouble concentrating.  Feeling cold or chills.  Weak or tired.  Sleepiness and fatigue.  Soreness and body aches. These side effects can affect parts of the body that were not involved in surgery. Follow these instructions at home:  For at least 24 hours after the procedure:  Have a responsible adult stay with you. It is important to have someone help care for you until you are awake and alert.  Rest as needed.  Do not: ? Participate in activities in which you could fall or become injured. ? Drive. ? Use heavy machinery. ? Drink alcohol. ? Take sleeping pills or medicines that cause drowsiness. ? Make important decisions or sign legal documents. ? Take care of children on your own. Eating and drinking  Follow any instructions from your health care provider about eating or drinking restrictions.  When you feel hungry, start by eating small amounts of foods that are soft and easy to digest (bland), such as toast. Gradually return to your regular diet.  Drink enough fluid to keep your urine pale  yellow.  If you vomit, rehydrate by drinking water, juice, or clear broth. General instructions  If you have sleep apnea, surgery and certain medicines can increase your risk for breathing problems. Follow instructions from your health care provider about wearing your sleep device: ? Anytime you are sleeping, including during daytime naps. ? While taking prescription pain medicines, sleeping medicines, or medicines that make you drowsy.  Return to your normal activities as told by your health care provider. Ask your health care  provider what activities are safe for you.  Take over-the-counter and prescription medicines only as told by your health care provider.  If you smoke, do not smoke without supervision.  Keep all follow-up visits as told by your health care provider. This is important. Contact a health care provider if:  You have nausea or vomiting that does not get better with medicine.  You cannot eat or drink without vomiting.  You have pain that does not get better with medicine.  You are unable to pass urine.  You develop a skin rash.  You have a fever.  You have redness around your IV site that gets worse. Get help right away if:  You have difficulty breathing.  You have chest pain.  You have blood in your urine or stool, or you vomit blood. Summary  After the procedure, it is common to have a sore throat or nausea. It is also common to feel tired.  Have a responsible adult stay with you for the first 24 hours after general anesthesia. It is important to have someone help care for you until you are awake and alert.  When you feel hungry, start by eating small amounts of foods that are soft and easy to digest (bland), such as toast. Gradually return to your regular diet.  Drink enough fluid to keep your urine pale yellow.  Return to your normal activities as told by your health care provider. Ask your health care provider what activities are safe for you. This information is not intended to replace advice given to you by your health care provider. Make sure you discuss any questions you have with your health care provider. Document Revised: 10/21/2017 Document Reviewed: 06/03/2017 Elsevier Patient Education  [place].

## 2020-02-04 NOTE — Transfer of Care (Signed)
Immediate Anesthesia Transfer of Care Note  Patient: Laura [name]  Procedure(s) Performed: KNEE ARTHROSCOPY WITH PARTIAL LATERAL AND MEDIAL MENISECTOMY,  PARTIAL SYNOVECTOMY AND CHONDROPLASTY (Right Knee)  Patient Location: PACU  Anesthesia Type: General  Level of Consciousness: awake, alert  and patient cooperative  Airway and Oxygen Therapy: Patient Spontanous Breathing and Patient connected to supplemental oxygen  Post-op Assessment: Post-op Vital signs reviewed, Patient's Cardiovascular Status Stable, Respiratory Function Stable, Patent Airway and No signs of Nausea or vomiting  Post-op Vital Signs: Reviewed and stable  Complications: No apparent anesthesia complications

## 2020-02-05 ENCOUNTER — Encounter: Payer: Self-pay | Admitting: *Deleted

## 2020-02-11 DIAGNOSIS — M25561 Pain in right knee: Secondary | ICD-10-CM | POA: Diagnosis not present

## 2020-02-11 DIAGNOSIS — Z9889 Other specified postprocedural states: Secondary | ICD-10-CM | POA: Diagnosis not present

## 2020-02-12 DIAGNOSIS — G4733 Obstructive sleep apnea (adult) (pediatric): Secondary | ICD-10-CM | POA: Diagnosis not present

## 2020-02-14 ENCOUNTER — Ambulatory Visit (INDEPENDENT_AMBULATORY_CARE_PROVIDER_SITE_OTHER): Payer: Medicare Other

## 2020-02-14 ENCOUNTER — Encounter: Payer: Self-pay | Admitting: Internal Medicine

## 2020-02-14 ENCOUNTER — Other Ambulatory Visit: Payer: Self-pay

## 2020-02-14 ENCOUNTER — Ambulatory Visit (INDEPENDENT_AMBULATORY_CARE_PROVIDER_SITE_OTHER): Payer: Medicare Other | Admitting: Internal Medicine

## 2020-02-14 VITALS — BP 146/90 | HR 82 | Ht 65.0 in | Wt 188.4 lb

## 2020-02-14 DIAGNOSIS — R0600 Dyspnea, unspecified: Secondary | ICD-10-CM

## 2020-02-14 DIAGNOSIS — I493 Ventricular premature depolarization: Secondary | ICD-10-CM | POA: Diagnosis not present

## 2020-02-14 MED ORDER — MEXILETINE HCL 150 MG PO CAPS: ORAL_CAPSULE | ORAL | 6 refills | Status: DC

## 2020-02-14 MED ORDER — HYDROCHLOROTHIAZIDE 25 MG PO TABS: 25.0000 mg | ORAL_TABLET | Freq: Every day | ORAL | 6 refills | Status: DC
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
## 2020-02-14 NOTE — Patient Instructions (Signed)
Medication Instructions:  - Your physician has recommended you make the following change in your medication:   1) Start mexiletine 150 mg- take 1 capsule (150 mg) by mouth twice daily  2) Start hydrochlorothiazide (HCTZ) 25 mg- take 1 tablet by mouth once daily   *If you need a refill on your cardiac medications before your next appointment, please call your pharmacy*   Lab Work: - Your physician recommends that you have lab work today: BMP/ Magnesium  If you have labs (blood work) drawn today and your tests are completely normal, you will receive your results only by: Marland Kitchen MyChart Message (if you have MyChart) OR . A paper copy in the mail If you have any lab test that is abnormal or we need to change your treatment, we will call you to review the results.   Testing/Procedures: - Your physician has recommended that you wear a 3 day Zio monitor. This monitor is a medical device that records the heart's electrical activity. Doctors most often use these monitors to diagnose arrhythmias. Arrhythmias are problems with the speed or rhythm of the heartbeat. The monitor is a small device applied to your chest. You can wear one while you do your normal daily activities. While wearing this monitor if you have any symptoms to push the button and record what you felt. Once you have worn this monitor for the period of time provider prescribed (Usually 14 days), you will return the monitor device in the postage paid box. Once it is returned they will download the data collected and provide [place] with a report which the provider will then review and we will call you with those results. Important tips:  1. Avoid showering during the first 24 hours of wearing the monitor. 2. Avoid excessive sweating to help maximize wear time. 3. Do not submerge the device, no hot tubs, and no swimming pools. 4. Keep any lotions or oils away from the patch. 5. After 24 hours you may shower with the patch on. Take brief showers  with your back facing the shower head.  6. Do not remove patch once it has been placed because that will interrupt data and decrease adhesive wear time. 7. Push the button when you have any symptoms and write down what you were feeling. 8. Once you have completed wearing your monitor, remove and place into box which has postage paid and place in your outgoing mailbox.  9. If for some reason you have misplaced your box then call our office and we can provide another box and/or mail it off for you.         Follow-Up: At [facility], you and your health needs are our priority.  As part of our continuing mission to provide you with exceptional heart care, we have created designated Provider Care Teams.  These Care Teams include your primary Cardiologist (physician) and Advanced Practice Providers (APPs -  Physician Assistants and Nurse Practitioners) who all work together to provide you with the care you need, when you need it.  We recommend signing up for the patient portal called "MyChart".  Sign up information is provided on this After Visit Summary.  MyChart is used to connect with patients for Virtual Visits (Telemedicine).  Patients are able to view lab/test results, encounter notes, upcoming appointments, etc.  Non-urgent messages can be sent to your provider as well.   To learn more about what you can do with MyChart, go to [email or web address].    Your next appointment:  3 month(s)  The format for your next appointment:   In Person  Provider:   Virl Axe, MD   Other Instructions n/a

## 2020-02-15 LAB — BASIC METABOLIC PANEL
BUN/Creatinine Ratio: 29 — ABNORMAL HIGH (ref 9–23)
BUN: 26 mg/dL — ABNORMAL HIGH (ref 6–24)
CO2: 21 mmol/L (ref 20–29)
Calcium: 9.9 mg/dL (ref 8.7–10.2)
Chloride: 100 mmol/L (ref 96–106)
Creatinine, Ser: 0.91 mg/dL (ref 0.57–1.00)
GFR calc Af Amer: 83 mL/min/{1.73_m2} (ref >59)
GFR calc non Af Amer: 72 mL/min/{1.73_m2} (ref >59)
Glucose: 102 mg/dL — ABNORMAL HIGH (ref 65–99)
Potassium: 4.4 mmol/L (ref 3.5–5.2)
Sodium: 141 mmol/L (ref 134–144)

## 2020-02-15 LAB — MAGNESIUM: Magnesium: 2 mg/dL (ref 1.6–2.3)

## 2020-02-19 ENCOUNTER — Ambulatory Visit: Payer: Medicare Other | Attending: Internal Medicine

## 2020-02-19 DIAGNOSIS — Z23 Encounter for immunization: Secondary | ICD-10-CM
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
## 2020-02-19 NOTE — Progress Notes (Signed)
   Covid-19 Vaccination Clinic  Name:  SOLYMAR TREES    MRN: [id number] DOB: 03-06-1966  02/19/2020  [name] was observed post Covid-19 immunization for 30 minutes based on pre-vaccination screening without incident. She was provided with Vaccine Information Sheet and instruction to access the V-Safe system.   [name] was instructed to call 911 with any severe reactions post vaccine: Marland Kitchen Difficulty breathing  . Swelling of face and throat  . A fast heartbeat  . A bad rash all over body  . Dizziness and weakness   Immunizations Administered    Name Date Dose VIS Date Route   Pfizer COVID-19 Vaccine 02/19/2020  1:14 PM 0.3 mL 12/26/2018 Intramuscular   Manufacturer: [place]   Lot: [id number]   [place]: [id number]

## 2020-02-20 ENCOUNTER — Telehealth (INDEPENDENT_AMBULATORY_CARE_PROVIDER_SITE_OTHER): Payer: Medicare Other | Admitting: Cardiology

## 2020-02-20 ENCOUNTER — Other Ambulatory Visit: Payer: Self-pay

## 2020-02-20 VITALS — BP 136/84 | HR 69 | Wt 187.0 lb

## 2020-02-20 DIAGNOSIS — G4733 Obstructive sleep apnea (adult) (pediatric): Secondary | ICD-10-CM

## 2020-02-20 DIAGNOSIS — E669 Obesity, unspecified: Secondary | ICD-10-CM | POA: Diagnosis not present

## 2020-02-20 DIAGNOSIS — I10 Essential (primary) hypertension: Secondary | ICD-10-CM | POA: Diagnosis not present

## 2020-02-20 NOTE — Patient Instructions (Signed)
Medication Instructions:  No changes *If you need a refill on your cardiac medications before your next appointment, please call your pharmacy*   Lab Work: none  Testing/Procedures: none   Follow-Up: At [facility], you and your health needs are our priority.  As part of our continuing mission to provide you with exceptional heart care, we have created designated Provider Care Teams.  These Care Teams include your primary Cardiologist (physician) and Advanced Practice Providers (APPs -  Physician Assistants and Nurse Practitioners) who all work together to provide you with the care you need, when you need it.  Your next appointment:   12 month(s)  The format for your next appointment:   Either In Person or Virtual  Provider:   Fransico Him, MD   Other Instructions

## 2020-02-21 ENCOUNTER — Telehealth: Payer: Medicare Other | Admitting: Internal Medicine

## 2020-02-25 DIAGNOSIS — G4733 Obstructive sleep apnea (adult) (pediatric): Secondary | ICD-10-CM | POA: Diagnosis not present

## 2020-02-26 NOTE — Telephone Encounter (Signed)
Can we try her on hydralazine 25 mg bid for her BP And then we can try some thing different for her PVCs-- one thing at a time Thanks SK

## 2020-02-28 ENCOUNTER — Telehealth: Payer: Self-pay | Admitting: *Deleted

## 2020-02-28 ENCOUNTER — Other Ambulatory Visit: Payer: Self-pay | Admitting: *Deleted

## 2020-02-28 NOTE — Telephone Encounter (Signed)
Staff message sent to Laura [name] ok to schedule BIPAP titration. Per [employer] no PA is required. Decision ID [id number].

## 2020-02-28 NOTE — Progress Notes (Signed)
Error

## 2020-02-29 ENCOUNTER — Other Ambulatory Visit: Payer: Self-pay | Admitting: *Deleted

## 2020-02-29 MED ORDER — HYDRALAZINE HCL 25 MG PO TABS: 25.0000 mg | ORAL_TABLET | Freq: Two times a day (BID) | ORAL | 3 refills | Status: DC

## 2020-02-29 NOTE — Progress Notes (Signed)
RX for hydralazine 25 mg BID sent to [facility].

## 2020-03-03 ENCOUNTER — Encounter (HOSPITAL_BASED_OUTPATIENT_CLINIC_OR_DEPARTMENT_OTHER): Payer: Self-pay

## 2020-03-03 NOTE — Telephone Encounter (Signed)
Patient is scheduled for BiPAP Titration on 03/11/20. pt is scheduled for COVID screening on 03/08/20 11:15 prior to titration.  Patient understands her titration study will be done at [facility] sleep lab. Patient understands she will receive a letter in a week or so detailing appointment, date, time, and location. Patient understands to call if she does not receive the letter in a timely manner. Patient agrees with treatment and thanked me for call.

## 2020-03-03 NOTE — Telephone Encounter (Signed)
RE: precert Lauralee Evener, CMA  Freada Bergeron, CMA  Ok to schedule. No PA is required. [employer] Decision ID :[id number].

## 2020-03-04 ENCOUNTER — Telehealth: Payer: Self-pay | Admitting: Internal Medicine

## 2020-03-04 ENCOUNTER — Encounter (HOSPITAL_BASED_OUTPATIENT_CLINIC_OR_DEPARTMENT_OTHER): Payer: Self-pay

## 2020-03-04 DIAGNOSIS — Z79899 Other long term (current) drug therapy: Secondary | ICD-10-CM

## 2020-03-04 DIAGNOSIS — I493 Ventricular premature depolarization: Secondary | ICD-10-CM | POA: Diagnosis not present

## 2020-03-04 NOTE — Telephone Encounter (Signed)
The patient's ZIO report just posted today in ZIOsuite. I have uploaded her monitor for Dr. Caryl Comes to review.  Will discuss the patient's message below with Dr. Caryl Comes prior to calling her back.

## 2020-03-04 NOTE — Telephone Encounter (Signed)
Pt c/o medication issue:  1. Name of Medication: mexiletine    2. How are you currently taking this medication (dosage and times per day)? 150 mg bid ( morning and night)  3. Are you having a reaction (difficulty breathing--STAT)? n/a  4. What is your medication issue? Does not feel like it is working, wants to know if she can stop. Still having PVC's, SOB, fatigue and states it is affecting patients life  Patient also questioning hydrochlorothiazide and her potassium levels.   Patient is also calling to check on her heart monitor results.  Please advise

## 2020-03-06 NOTE — Telephone Encounter (Signed)
I will reach out to Dr. Lenard Galloway later today and send him a copy of Dr. Jackalyn Lombard note and see whether he would be willing to see her.  It is certainly reasonable to check for that and her magnesium.  Heather Thanks SK

## 2020-03-07 ENCOUNTER — Other Ambulatory Visit: Payer: Self-pay | Admitting: Internal Medicine

## 2020-03-07 DIAGNOSIS — Z79899 Other long term (current) drug therapy: Secondary | ICD-10-CM | POA: Diagnosis not present

## 2020-03-07 DIAGNOSIS — I493 Ventricular premature depolarization: Secondary | ICD-10-CM | POA: Diagnosis not present

## 2020-03-07 NOTE — Telephone Encounter (Signed)
I responded back to the patient through her MyChart that she may have her labs (BMP/ Mag[place]) at the [facility] in [place].   Orders faxed to [phone].  Confirmation received.

## 2020-03-07 NOTE — Addendum Note (Signed)
Addended by: Alvis Lemmings C on: 03/07/2020 12:06 PM   Modules accepted: Orders

## 2020-03-07 NOTE — Telephone Encounter (Signed)
See MyChart messages for recent correspondence with the patient.

## 2020-03-07 NOTE — Telephone Encounter (Signed)
Patient calling  States that Dr Caryl Comes ordered labs and had them set up to be done in [place] Patient lives about an hour away and would like to know if the order can be changed for [facility] in [place] Please call to discuss

## 2020-03-08 ENCOUNTER — Other Ambulatory Visit (HOSPITAL_COMMUNITY)
Admission: RE | Admit: 2020-03-08 | Discharge: 2020-03-08 | Disposition: A | Discharge status: Home / Self Care | Payer: Medicare Other | Source: Ambulatory Visit | Attending: Cardiology | Admitting: Cardiology

## 2020-03-08 DIAGNOSIS — Z01812 Encounter for preprocedural laboratory examination: Secondary | ICD-10-CM | POA: Insufficient documentation

## 2020-03-08 DIAGNOSIS — Z20822 Contact with and (suspected) exposure to covid-19: Secondary | ICD-10-CM | POA: Insufficient documentation

## 2020-03-08 LAB — BASIC METABOLIC PANEL
BUN/Creatinine Ratio: 31 — ABNORMAL HIGH (ref 9–23)
BUN: 27 mg/dL — ABNORMAL HIGH (ref 6–24)
CO2: 32 mmol/L — ABNORMAL HIGH (ref 20–29)
Calcium: 10.3 mg/dL — ABNORMAL HIGH (ref 8.7–10.2)
Chloride: 93 mmol/L — ABNORMAL LOW (ref 96–106)
Creatinine, Ser: 0.87 mg/dL (ref 0.57–1.00)
GFR calc Af Amer: 87 mL/min/{1.73_m2} (ref >59)
GFR calc non Af Amer: 76 mL/min/{1.73_m2} (ref >59)
Glucose: 119 mg/dL — ABNORMAL HIGH (ref 65–99)
Potassium: 4.1 mmol/L (ref 3.5–5.2)
Sodium: 142 mmol/L (ref 134–144)

## 2020-03-08 LAB — SARS CORONAVIRUS 2 (TAT 6-24 HRS): SARS Coronavirus 2: NEGATIVE

## 2020-03-08 LAB — MAGNESIUM: Magnesium: 1.7 mg/dL (ref 1.6–2.3)

## 2020-03-10 ENCOUNTER — Other Ambulatory Visit: Payer: Self-pay

## 2020-03-10 DIAGNOSIS — Z79899 Other long term (current) drug therapy: Secondary | ICD-10-CM

## 2020-03-10 DIAGNOSIS — I493 Ventricular premature depolarization: Secondary | ICD-10-CM

## 2020-03-11 ENCOUNTER — Encounter: Payer: Self-pay | Admitting: Internal Medicine

## 2020-03-11 ENCOUNTER — Other Ambulatory Visit (HOSPITAL_COMMUNITY): Payer: Medicare Other

## 2020-03-11 ENCOUNTER — Other Ambulatory Visit: Payer: Self-pay

## 2020-03-11 ENCOUNTER — Telehealth (INDEPENDENT_AMBULATORY_CARE_PROVIDER_SITE_OTHER): Payer: Medicare Other | Admitting: Internal Medicine

## 2020-03-11 ENCOUNTER — Ambulatory Visit (HOSPITAL_BASED_OUTPATIENT_CLINIC_OR_DEPARTMENT_OTHER): Payer: Medicare Other | Attending: Cardiology | Admitting: Cardiology

## 2020-03-11 VITALS — BP 156/93 | HR 83 | Ht 64.0 in | Wt 187.0 lb

## 2020-03-11 VITALS — Temp 97.6°F | Ht 64.0 in | Wt 186.0 lb

## 2020-03-11 DIAGNOSIS — I493 Ventricular premature depolarization: Secondary | ICD-10-CM

## 2020-03-11 DIAGNOSIS — G4733 Obstructive sleep apnea (adult) (pediatric): Secondary | ICD-10-CM | POA: Diagnosis not present

## 2020-03-11 DIAGNOSIS — Z79899 Other long term (current) drug therapy: Secondary | ICD-10-CM | POA: Diagnosis not present

## 2020-03-11 DIAGNOSIS — U071 COVID-19: Secondary | ICD-10-CM

## 2020-03-11 DIAGNOSIS — R5383 Other fatigue: Secondary | ICD-10-CM | POA: Diagnosis not present
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
## 2020-03-11 NOTE — Patient Instructions (Addendum)
Medication Instructions:  - Your physician recommends that you continue on your current medications as directed. Please refer to the Current Medication list given to you today.  *If you need a refill on your cardiac medications before your next appointment, please call your pharmacy*   Lab Work: - none ordered  If you have labs (blood work) drawn today and your tests are completely normal, you will receive your results only by: Marland Kitchen MyChart Message (if you have MyChart) OR . A paper copy in the mail If you have any lab test that is abnormal or we need to change your treatment, we will call you to review the results.   Testing/Procedures: - none ordered   Follow-Up: At [facility], you and your health needs are our priority.  As part of our continuing mission to provide you with exceptional heart care, we have created designated Provider Care Teams.  These Care Teams include your primary Cardiologist (physician) and Advanced Practice Providers (APPs -  Physician Assistants and Nurse Practitioners) who all work together to provide you with the care you need, when you need it.  We recommend signing up for the patient portal called "MyChart".  Sign up information is provided on this After Visit Summary.  MyChart is used to connect with patients for Virtual Visits (Telemedicine).  Patients are able to view lab/test results, encounter notes, upcoming appointments, etc.  Non-urgent messages can be sent to your provider as well.   To learn more about what you can do with MyChart, go to [email or web address].    Your next appointment:   6 month(s)  The format for your next appointment:   In Person  Provider:   Virl Axe, MD   Other Instructions - You are being referred to Dr. Noralee Stain in [place] for further consultation for PVC's - His office should reach out to you directly to schedule

## 2020-03-12 NOTE — Procedures (Signed)
    Patient Name: Laura [name], Hnat Date: 03/11/2020 Gender: Female D.O.B: 10-May-1966 Age (years): 61 Referring Provider: Fransico Him MD, ABSM Height (inches): 64 Interpreting Physician: Fransico Him MD, ABSM Weight (lbs): 186 RPSGT: Carolin Coy BMI: 32 MRN: [id number] Neck Size: 13.50  CLINICAL INFORMATION The patient is referred for a BiPAP titration to treat sleep apnea.  SLEEP STUDY TECHNIQUE As per the AASM Manual for the Scoring of Sleep and Associated Events v2.3 (April 2016) with a hypopnea requiring 4% desaturations.  The channels recorded and monitored were frontal, central and occipital EEG, electrooculogram (EOG), submentalis EMG (chin), nasal and oral airflow, thoracic and abdominal wall motion, anterior tibialis EMG, snore microphone, electrocardiogram, and pulse oximetry. Bilevel positive airway pressure (BPAP) was initiated at the beginning of the study and titrated to treat sleep-disordered breathing.  MEDICATIONS Medications self-administered by patient taken the night of the study : PROTONIX, ZANAFLEX, QUINAPRIL, AMBIEN, flexainide, HYDRODIURIL, XANAX  RESPIRATORY PARAMETERS Optimal IPAP Pressure (cm): 11  AHI at Optimal Pressure (/hr) 0.0 Optimal EPAP Pressure (cm):7  Overall Minimal O2 (%):92.0  Minimal O2 at Optimal Pressure (%): 93.0  SLEEP ARCHITECTURE Start Time:10:22:37 PM  Stop Time:4:29:10 AM  Total Time (min):366.5  Total Sleep Time (min):348 Sleep Latency (min):7.8  Sleep Efficiency (%):94.9%  REM Latency (min):185.0  WASO (min):10.7 Stage N1 (%): 8.6%  Stage N2 (%): 76.3%  Stage N3 (%): 0.0%  Stage R (%):15.1 Supine (%):18.42  Arousal Index (/hr):8.6   CARDIAC DATA The 2 lead EKG demonstrated sinus rhythm. The mean heart rate was 73.9 beats per minute. Other EKG findings include: PVCs.  LEG MOVEMENT DATA The total Periodic Limb Movements of Sleep (PLMS) were 0. The PLMS index was 0.0. A PLMS index of <15 is considered normal in  adults.  IMPRESSIONS - An optimal PAP pressure was selected for this patient ( 11 / cm of water) - Central sleep apnea was not noted during this titration (CAI = 0.0/h). - Significant oxygen desaturations were not observed during this titration (min O2 = 92.0%). - The patient snored with soft snoring volume. - 2-lead EKG demonstrated: PVCs - Clinically significant periodic limb movements were not noted during this study. Arousals associated with PLMs were rare.  DIAGNOSIS - Obstructive Sleep Apnea (327.23 [G47.33 ICD-10]) - PVCs  RECOMMENDATIONS - Trial of BiPAP therapy on 11/7 cm H2O with a Small size Resmed Full Face Mask AirFit F10 for Her mask and heated humidification. - Avoid alcohol, sedatives and other CNS depressants that may worsen sleep apnea and disrupt normal sleep architecture. - Sleep hygiene should be reviewed to assess factors that may improve sleep quality. - Weight management and regular exercise should be initiated or continued. - Return to Sleep Center for re-evaluation after 8 weeks of therapy  [Electronically signed] 03/12/2020 08:17 AM  Fransico Him MD, ABSM Diplomate, American Board of Sleep Medicine

## 2020-03-14 ENCOUNTER — Telehealth: Payer: Self-pay | Admitting: *Deleted

## 2020-03-14 NOTE — Telephone Encounter (Signed)
Informed patient of sleep study results and patient understanding was verbalized. Patient understands her sleep study showed they had a successful PAP titration and let DME know that orders are in EPIC. Please set up 10 week OV with me.   Upon patient request DME selection is [facility]. Patient understands SHE will be contacted by [facility] to set up HER cpap. Patient understands to call if [facility] does not contact HER with new setup in a timely manner. Patient understands they will be called once confirmation has been received from [facility] that they have received their new machine to schedule 10 week follow up appointment.  [facility] notified of new cpap order  Please add to airview Patient was grateful for the call and thanked me.

## 2020-03-14 NOTE — Telephone Encounter (Signed)
-----   Message from Sueanne Margarita, MD sent at 03/12/2020  8:22 AM EDT ----- Please let patient know that they had a successful PAP titration and let DME know that orders are in EPIC.  Please set up 10 week OV with me.

## 2020-03-20 ENCOUNTER — Telehealth: Payer: Self-pay | Admitting: Internal Medicine

## 2020-03-20 NOTE — Telephone Encounter (Signed)
Contact patient 03/20/20 to schedule for appointment, patient already had appointment but wasn't removed from recall list

## 2020-03-21 ENCOUNTER — Telehealth: Payer: Self-pay | Admitting: *Deleted

## 2020-03-21 NOTE — Telephone Encounter (Signed)
-----   Message from Sueanne Margarita, MD sent at 03/20/2020 11:14 PM EDT ----- Good AHI on PAP but needs to improve compliance

## 2020-03-21 NOTE — Telephone Encounter (Signed)
Informed patient of compliance results and verbalized understanding was indicated. Patient is aware and agreeable to AHI being within range at 2.3. Patient is aware and agreeable to improving in compliance with machine usage.

## 2020-03-25 DIAGNOSIS — I493 Ventricular premature depolarization: Secondary | ICD-10-CM | POA: Diagnosis not present

## 2020-03-26 DIAGNOSIS — G4733 Obstructive sleep apnea (adult) (pediatric): Secondary | ICD-10-CM | POA: Diagnosis not present

## 2020-03-27 ENCOUNTER — Ambulatory Visit: Payer: Medicare Other | Admitting: Cardiovascular Disease

## 2020-04-03 ENCOUNTER — Ambulatory Visit: Payer: Medicare Other | Admitting: Cardiovascular Disease

## 2020-04-03 DIAGNOSIS — E7849 Other hyperlipidemia: Secondary | ICD-10-CM | POA: Diagnosis not present

## 2020-04-03 NOTE — Telephone Encounter (Signed)
Patient has not received her Bipap machine and per [place] Patient she does not qualify for the bipap machine based on the data they received. Dr Theodosia Blender note talks about severe stomach bloating. [facility] says they have the notes but not the bipap titration. I have faxed it over.

## 2020-04-04 LAB — LIPID PANEL
Chol/HDL Ratio: 3.8 ratio (ref 0.0–4.4)
Cholesterol, Total: 212 mg/dL — ABNORMAL HIGH (ref 100–199)
HDL: 56 mg/dL (ref >39)
LDL Chol Calc (NIH): 126 mg/dL — ABNORMAL HIGH (ref 0–99)
Triglycerides: 169 mg/dL — ABNORMAL HIGH (ref 0–149)
VLDL Cholesterol Cal: 30 mg/dL (ref 5–40)

## 2020-04-07 ENCOUNTER — Other Ambulatory Visit: Payer: Self-pay

## 2020-04-08 ENCOUNTER — Encounter: Payer: Self-pay | Admitting: Internal Medicine

## 2020-04-08 ENCOUNTER — Other Ambulatory Visit: Payer: Self-pay | Admitting: Family Medicine

## 2020-04-08 ENCOUNTER — Ambulatory Visit: Payer: Medicare Other | Admitting: Legal Medicine

## 2020-04-08 ENCOUNTER — Telehealth: Payer: Self-pay | Admitting: Internal Medicine

## 2020-04-08 ENCOUNTER — Telehealth (INDEPENDENT_AMBULATORY_CARE_PROVIDER_SITE_OTHER): Payer: Medicare Other | Admitting: Internal Medicine

## 2020-04-08 DIAGNOSIS — M791 Myalgia, unspecified site: Secondary | ICD-10-CM

## 2020-04-08 DIAGNOSIS — I493 Ventricular premature depolarization: Secondary | ICD-10-CM | POA: Diagnosis not present

## 2020-04-08 DIAGNOSIS — K2270 Barrett's esophagus without dysplasia: Secondary | ICD-10-CM

## 2020-04-08 DIAGNOSIS — T466X5A Adverse effect of antihyperlipidemic and antiarteriosclerotic drugs, initial encounter: Secondary | ICD-10-CM

## 2020-04-08 DIAGNOSIS — J4530 Mild persistent asthma, uncomplicated: Secondary | ICD-10-CM

## 2020-04-08 DIAGNOSIS — E785 Hyperlipidemia, unspecified: Secondary | ICD-10-CM

## 2020-04-08 MED ORDER — HYDROCHLOROTHIAZIDE 25 MG PO TABS: 25.0000 mg | ORAL_TABLET | Freq: Every day | ORAL | 1 refills | Status: DC

## 2020-04-08 MED ORDER — HYDROCHLOROTHIAZIDE 25 MG PO TABS: 25.0000 mg | ORAL_TABLET | Freq: Every day | ORAL | 6 refills | Status: DC

## 2020-04-08 NOTE — Telephone Encounter (Signed)
Pt mentioned she is taking HCTZ 25 mg tablet bid per Dr.Klein. Please advise I don't see where medication was changed or prescribed at last OV or telephone note. Pt is requesting new Rx. Last ov note says HCTZ 25 mg tablet qd.

## 2020-04-08 NOTE — Telephone Encounter (Signed)
Please call insurance and explain issue to them

## 2020-04-08 NOTE — Telephone Encounter (Signed)
*  STAT* If patient is at the pharmacy, call can be transferred to refill team.   1. Which medications need to be refilled? (please list name of each medication and dose if known)  HCTZ 25 MG PO BID   2. Which pharmacy/location (including street and city if local pharmacy) is medication to be sent to? [facility]  3. Do they need a 30 day or 90 day supply?   [facility]   Patient is out of meds

## 2020-04-08 NOTE — Telephone Encounter (Signed)
Medication order placed as originally ordered by Dr Caryl Comes.

## 2020-04-08 NOTE — Telephone Encounter (Signed)
Please call her insurance an explain what the issue is

## 2020-04-08 NOTE — Addendum Note (Signed)
Addended by: Fidel Levy on: 04/08/2020 09:10 AM   Modules accepted: Orders

## 2020-04-08 NOTE — Telephone Encounter (Signed)
Patient has not received her Bipap machine and per [place] Patient is still saying she does not qualify for the bipap machine. [facility] patient says when she uses her cpap it works to control her apneas but that is not the problem, the problem is in the mornings her belly is swollen like basketball. DME says insurance will not pay at all.

## 2020-04-08 NOTE — Patient Instructions (Signed)
Medication Instructions:  Your physician recommends that you continue on your current medications as directed. Please refer to the Current Medication list given to you today.  *If you need a refill on your cardiac medications before your next appointment, please call your pharmacy*   Lab Work: FASTING lipid panel in 6 months  If you have labs (blood work) drawn today and your tests are completely normal, you will receive your results only by: . MyChart Message (if you have MyChart) OR . A paper copy in the mail If you have any lab test that is abnormal or we need to change your treatment, we will call you to review the results.   Testing/Procedures: NONE   Follow-Up: At [facility], you and your health needs are our priority.  As part of our continuing mission to provide you with exceptional heart care, we have created designated Provider Care Teams.  These Care Teams include your primary Cardiologist (physician) and Advanced Practice Providers (APPs -  Physician Assistants and Nurse Practitioners) who all work together to provide you with the care you need, when you need it.  We recommend signing up for the patient portal called "MyChart".  Sign up information is provided on this After Visit Summary.  MyChart is used to connect with patients for Virtual Visits (Telemedicine).  Patients are able to view lab/test results, encounter notes, upcoming appointments, etc.  Non-urgent messages can be sent to your provider as well.   To learn more about what you can do with MyChart, go to [email or web address].    Your next appointment:   6 month(s) - lipid clinic  The format for your next appointment:   Either In Person or Virtual  Provider:   K. Chad Hilty, MD   Other Instructions   
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
## 2020-04-09 NOTE — Telephone Encounter (Signed)
Patient advise request response sent to the patient. Will close this encounter.

## 2020-04-10 ENCOUNTER — Other Ambulatory Visit: Payer: Self-pay | Admitting: Internal Medicine

## 2020-04-10 ENCOUNTER — Other Ambulatory Visit: Payer: Self-pay | Admitting: *Deleted

## 2020-04-10 MED ORDER — HYDROCHLOROTHIAZIDE 25 MG PO TABS: ORAL_TABLET | ORAL | 1 refills | Status: DC

## 2020-04-11 NOTE — Telephone Encounter (Signed)
Pt's pharmacy is requesting a 90 day supply. Please address

## 2020-04-15 ENCOUNTER — Encounter: Payer: Self-pay | Admitting: Legal Medicine

## 2020-04-15 ENCOUNTER — Ambulatory Visit (INDEPENDENT_AMBULATORY_CARE_PROVIDER_SITE_OTHER): Payer: Medicare Other | Admitting: Legal Medicine

## 2020-04-15 ENCOUNTER — Other Ambulatory Visit: Payer: Self-pay

## 2020-04-15 VITALS — BP 120/70 | HR 79 | Temp 98.0°F | Resp 17 | Ht 64.0 in | Wt 191.0 lb

## 2020-04-15 DIAGNOSIS — E782 Mixed hyperlipidemia: Secondary | ICD-10-CM | POA: Diagnosis not present

## 2020-04-15 DIAGNOSIS — F1111 Opioid abuse, in remission: Secondary | ICD-10-CM

## 2020-04-15 DIAGNOSIS — N952 Postmenopausal atrophic vaginitis: Secondary | ICD-10-CM

## 2020-04-15 DIAGNOSIS — J0100 Acute maxillary sinusitis, unspecified: Secondary | ICD-10-CM | POA: Diagnosis not present

## 2020-04-15 DIAGNOSIS — I10 Essential (primary) hypertension: Secondary | ICD-10-CM | POA: Diagnosis not present

## 2020-04-15 DIAGNOSIS — R7303 Prediabetes: Secondary | ICD-10-CM

## 2020-04-15 LAB — POCT URINALYSIS DIPSTICK
Bilirubin, UA: NEGATIVE
Blood, UA: NEGATIVE
Glucose, UA: NEGATIVE
Ketones, UA: NEGATIVE
Leukocytes, UA: NEGATIVE
Nitrite, UA: NEGATIVE
Protein, UA: NEGATIVE
Spec Grav, UA: 1.015 (ref 1.010–1.025)
Urobilinogen, UA: 0.2 E.U./dL
pH, UA: 6 (ref 5.0–8.0)

## 2020-04-15 MED ORDER — PREMARIN 0.625 MG/GM VA CREA: 1.0000 | TOPICAL_CREAM | Freq: Every day | VAGINAL | 12 refills | Status: DC

## 2020-04-15 MED ORDER — FLUCONAZOLE 150 MG PO TABS: 150.0000 mg | ORAL_TABLET | Freq: Once | ORAL | 0 refills | Status: AC

## 2020-04-15 MED ORDER — AMOXICILLIN-POT CLAVULANATE 875-125 MG PO TABS: 1.0000 | ORAL_TABLET | Freq: Two times a day (BID) | ORAL | 0 refills | Status: DC
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
## 2020-04-16 ENCOUNTER — Other Ambulatory Visit: Payer: Self-pay

## 2020-04-16 LAB — COMPREHENSIVE METABOLIC PANEL
ALT: 18 IU/L (ref 0–32)
AST: 19 IU/L (ref 0–40)
Albumin/Globulin Ratio: 1.9 (ref 1.2–2.2)
Albumin: 4.7 g/dL (ref 3.8–4.9)
Alkaline Phosphatase: 72 IU/L (ref 48–121)
BUN/Creatinine Ratio: 29 — ABNORMAL HIGH (ref 9–23)
BUN: 21 mg/dL (ref 6–24)
Bilirubin Total: 0.3 mg/dL (ref 0.0–1.2)
CO2: 28 mmol/L (ref 20–29)
Calcium: 9.8 mg/dL (ref 8.7–10.2)
Chloride: 98 mmol/L (ref 96–106)
Creatinine, Ser: 0.73 mg/dL (ref 0.57–1.00)
GFR calc Af Amer: 108 mL/min/{1.73_m2} (ref >59)
GFR calc non Af Amer: 94 mL/min/{1.73_m2} (ref >59)
Globulin, Total: 2.5 g/dL (ref 1.5–4.5)
Glucose: 108 mg/dL — ABNORMAL HIGH (ref 65–99)
Potassium: 3.8 mmol/L (ref 3.5–5.2)
Sodium: 139 mmol/L (ref 134–144)
Total Protein: 7.2 g/dL (ref 6.0–8.5)

## 2020-04-16 LAB — LIPID PANEL
Chol/HDL Ratio: 4.1 ratio (ref 0.0–4.4)
Cholesterol, Total: 202 mg/dL — ABNORMAL HIGH (ref 100–199)
HDL: 49 mg/dL (ref >39)
LDL Chol Calc (NIH): 100 mg/dL — ABNORMAL HIGH (ref 0–99)
Triglycerides: 317 mg/dL — ABNORMAL HIGH (ref 0–149)
VLDL Cholesterol Cal: 53 mg/dL — ABNORMAL HIGH (ref 5–40)

## 2020-04-16 LAB — CBC WITH DIFFERENTIAL/PLATELET
Basophils Absolute: 0 10*3/uL (ref 0.0–0.2)
Basos: 1 %
EOS (ABSOLUTE): 0.1 10*3/uL (ref 0.0–0.4)
Eos: 2 %
Hematocrit: 42.8 % (ref 34.0–46.6)
Hemoglobin: 13.6 g/dL (ref 11.1–15.9)
Immature Grans (Abs): 0 10*3/uL (ref 0.0–0.1)
Immature Granulocytes: 0 %
Lymphocytes Absolute: 2.4 10*3/uL (ref 0.7–3.1)
Lymphs: 36 %
MCH: 27.8 pg (ref 26.6–33.0)
MCHC: 31.8 g/dL (ref 31.5–35.7)
MCV: 87 fL (ref 79–97)
Monocytes Absolute: 0.6 10*3/uL (ref 0.1–0.9)
Monocytes: 9 %
Neutrophils Absolute: 3.4 10*3/uL (ref 1.4–7.0)
Neutrophils: 52 %
Platelets: 387 10*3/uL (ref 150–450)
RBC: 4.9 x10E6/uL (ref 3.77–5.28)
RDW: 13.1 % (ref 11.7–15.4)
WBC: 6.5 10*3/uL (ref 3.4–10.8)

## 2020-04-16 LAB — CARDIOVASCULAR RISK ASSESSMENT

## 2020-04-16 LAB — HEMOGLOBIN A1C
Est. average glucose Bld gHb Est-mCnc: 126 mg/dL
Hgb A1c MFr Bld: 6 % — ABNORMAL HIGH (ref 4.8–5.6)

## 2020-04-16 NOTE — Progress Notes (Signed)
CBC normal, glucose 108, kidney tests normal, liver tests normal Cholesterol LdL high  and triglycerides high- need low cholesterol diet, A1c 6.0, urinalysis normal lp

## 2020-04-17 ENCOUNTER — Telehealth: Payer: Self-pay | Admitting: Legal Medicine

## 2020-04-17 NOTE — Progress Notes (Signed)
°  Chronic Care Management   Note  04/17/2020 Name: DONIELLE KAIGLER MRN: [id number] DOB: 12-Feb-1966  [name] is a 54 y.o. year old female who is a primary care patient of Lillard Anes, MD. I reached out to [name] by phone today in response to a referral sent by Ms. Marquite I Atkins's PCP, Lillard Anes, MD.   [name] was given information about Chronic Care Management services today including:  1. CCM service includes personalized support from designated clinical staff supervised by her physician, including individualized plan of care and coordination with other care providers 2. 24/7 contact phone numbers for assistance for urgent and routine care needs. 3. Service will only be billed when office clinical staff spend 20 minutes or more in a month to coordinate care. 4. Only one practitioner may furnish and bill the service in a calendar month. 5. The patient may stop CCM services at any time (effective at the end of the month) by phone call to the office staff.   Patient agreed to services and verbal consent obtained.  This note is not being shared with the patient for the following reason: To respect privacy (The patient or proxy has requested that the information not be shared).  Follow up plan:   Earney [place] Upstream Scheduler

## 2020-04-26 DIAGNOSIS — G4733 Obstructive sleep apnea (adult) (pediatric): Secondary | ICD-10-CM | POA: Diagnosis not present

## 2020-05-01 NOTE — Telephone Encounter (Addendum)
[facility] patient Lauro) states that the cpap titiration shows that the cpap works so the company ad ot [employer] can not approve to order the [employer].

## 2020-05-06 ENCOUNTER — Other Ambulatory Visit: Payer: Self-pay | Admitting: Internal Medicine

## 2020-05-07 ENCOUNTER — Other Ambulatory Visit: Payer: Self-pay | Admitting: Family Medicine

## 2020-05-07 DIAGNOSIS — K2270 Barrett's esophagus without dysplasia: Secondary | ICD-10-CM

## 2020-05-08 DIAGNOSIS — K529 Noninfective gastroenteritis and colitis, unspecified: Secondary | ICD-10-CM | POA: Diagnosis not present

## 2020-05-08 DIAGNOSIS — E039 Hypothyroidism, unspecified: Secondary | ICD-10-CM | POA: Diagnosis not present

## 2020-05-08 DIAGNOSIS — J449 Chronic obstructive pulmonary disease, unspecified: Secondary | ICD-10-CM | POA: Diagnosis not present

## 2020-05-08 DIAGNOSIS — M1990 Unspecified osteoarthritis, unspecified site: Secondary | ICD-10-CM | POA: Diagnosis not present

## 2020-05-08 DIAGNOSIS — Z79899 Other long term (current) drug therapy: Secondary | ICD-10-CM | POA: Diagnosis not present

## 2020-05-08 DIAGNOSIS — E785 Hyperlipidemia, unspecified: Secondary | ICD-10-CM | POA: Diagnosis not present

## 2020-05-08 DIAGNOSIS — I493 Ventricular premature depolarization: Secondary | ICD-10-CM | POA: Diagnosis not present

## 2020-05-08 DIAGNOSIS — K219 Gastro-esophageal reflux disease without esophagitis: Secondary | ICD-10-CM | POA: Diagnosis not present

## 2020-05-08 DIAGNOSIS — I10 Essential (primary) hypertension: Secondary | ICD-10-CM | POA: Diagnosis not present

## 2020-05-09 DIAGNOSIS — I10 Essential (primary) hypertension: Secondary | ICD-10-CM | POA: Diagnosis not present

## 2020-05-09 DIAGNOSIS — Z79899 Other long term (current) drug therapy: Secondary | ICD-10-CM | POA: Diagnosis not present

## 2020-05-09 DIAGNOSIS — E785 Hyperlipidemia, unspecified: Secondary | ICD-10-CM | POA: Diagnosis not present

## 2020-05-09 DIAGNOSIS — M1990 Unspecified osteoarthritis, unspecified site: Secondary | ICD-10-CM | POA: Diagnosis not present

## 2020-05-09 DIAGNOSIS — J45909 Unspecified asthma, uncomplicated: Secondary | ICD-10-CM | POA: Diagnosis not present

## 2020-05-09 DIAGNOSIS — K219 Gastro-esophageal reflux disease without esophagitis: Secondary | ICD-10-CM | POA: Diagnosis not present

## 2020-05-09 DIAGNOSIS — J449 Chronic obstructive pulmonary disease, unspecified: Secondary | ICD-10-CM | POA: Diagnosis not present

## 2020-05-09 DIAGNOSIS — I493 Ventricular premature depolarization: Secondary | ICD-10-CM | POA: Diagnosis not present

## 2020-05-09 DIAGNOSIS — E039 Hypothyroidism, unspecified: Secondary | ICD-10-CM | POA: Diagnosis not present

## 2020-05-09 DIAGNOSIS — K529 Noninfective gastroenteritis and colitis, unspecified: Secondary | ICD-10-CM | POA: Diagnosis not present

## 2020-05-14 ENCOUNTER — Encounter: Payer: Self-pay | Admitting: Legal Medicine

## 2020-05-14 DIAGNOSIS — I493 Ventricular premature depolarization: Secondary | ICD-10-CM | POA: Diagnosis not present

## 2020-05-14 DIAGNOSIS — Z79899 Other long term (current) drug therapy: Secondary | ICD-10-CM | POA: Diagnosis not present

## 2020-05-14 DIAGNOSIS — Z7689 Persons encountering health services in other specified circumstances: Secondary | ICD-10-CM | POA: Diagnosis not present

## 2020-05-15 ENCOUNTER — Telehealth (INDEPENDENT_AMBULATORY_CARE_PROVIDER_SITE_OTHER): Payer: Medicare Other | Admitting: Internal Medicine

## 2020-05-15 ENCOUNTER — Other Ambulatory Visit: Payer: Self-pay | Admitting: Legal Medicine

## 2020-05-15 ENCOUNTER — Encounter: Payer: Self-pay | Admitting: Internal Medicine

## 2020-05-15 VITALS — Ht 64.0 in | Wt 183.0 lb

## 2020-05-15 DIAGNOSIS — I493 Ventricular premature depolarization: Secondary | ICD-10-CM

## 2020-05-15 DIAGNOSIS — I10 Essential (primary) hypertension: Secondary | ICD-10-CM

## 2020-05-15 LAB — BASIC METABOLIC PANEL
BUN/Creatinine Ratio: 22 (ref 9–23)
BUN: 17 mg/dL (ref 6–24)
CO2: 25 mmol/L (ref 20–29)
Calcium: 10.1 mg/dL (ref 8.7–10.2)
Chloride: 101 mmol/L (ref 96–106)
Creatinine, Ser: 0.76 mg/dL (ref 0.57–1.00)
GFR calc Af Amer: 103 mL/min/{1.73_m2} (ref >59)
GFR calc non Af Amer: 89 mL/min/{1.73_m2} (ref >59)
Glucose: 108 mg/dL — ABNORMAL HIGH (ref 65–99)
Potassium: 4.4 mmol/L (ref 3.5–5.2)
Sodium: 140 mmol/L (ref 134–144)
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
## 2020-05-15 NOTE — Patient Instructions (Signed)
Medication Instructions:  Your physician recommends that you continue on your current medications as directed. Please refer to the Current Medication list given to you today.  *If you need a refill on your cardiac medications before your next appointment, please call your pharmacy*   Follow-Up: At [facility], you and your health needs are our priority.  As part of our continuing mission to provide you with exceptional heart care, we have created designated Provider Care Teams.  These Care Teams include your primary Cardiologist (physician) and Advanced Practice Providers (APPs -  Physician Assistants and Nurse Practitioners) who all work together to provide you with the care you need, when you need it.  We recommend signing up for the patient portal called "MyChart".  Sign up information is provided on this After Visit Summary.  MyChart is used to connect with patients for Virtual Visits (Telemedicine).  Patients are able to view lab/test results, encounter notes, upcoming appointments, etc.  Non-urgent messages can be sent to your provider as well.   To learn more about what you can do with MyChart, go to [email or web address].    Your next appointment:   As needed    

## 2020-05-19 ENCOUNTER — Encounter: Payer: Self-pay | Admitting: Legal Medicine

## 2020-05-19 ENCOUNTER — Other Ambulatory Visit: Payer: Self-pay

## 2020-05-19 ENCOUNTER — Ambulatory Visit (INDEPENDENT_AMBULATORY_CARE_PROVIDER_SITE_OTHER): Payer: Medicare Other | Admitting: Legal Medicine

## 2020-05-19 VITALS — BP 140/72 | HR 72 | Temp 97.8°F | Resp 16 | Ht 64.0 in | Wt 185.0 lb

## 2020-05-19 DIAGNOSIS — L03119 Cellulitis of unspecified part of limb: Secondary | ICD-10-CM

## 2020-05-19 DIAGNOSIS — I10 Essential (primary) hypertension: Secondary | ICD-10-CM | POA: Diagnosis not present

## 2020-05-19 DIAGNOSIS — E039 Hypothyroidism, unspecified: Secondary | ICD-10-CM

## 2020-05-19 DIAGNOSIS — L309 Dermatitis, unspecified: Secondary | ICD-10-CM

## 2020-05-19 DIAGNOSIS — M1711 Unilateral primary osteoarthritis, right knee: Secondary | ICD-10-CM | POA: Diagnosis not present

## 2020-05-19 DIAGNOSIS — E782 Mixed hyperlipidemia: Secondary | ICD-10-CM

## 2020-05-19 DIAGNOSIS — L02619 Cutaneous abscess of unspecified foot: Secondary | ICD-10-CM | POA: Insufficient documentation

## 2020-05-19 DIAGNOSIS — Z6831 Body mass index (BMI) 31.0-31.9, adult: Secondary | ICD-10-CM | POA: Diagnosis not present

## 2020-05-19 MED ORDER — TRIAMCINOLONE ACETONIDE 40 MG/ML IJ SUSP: 40.0000 mg | Freq: Once | INTRAMUSCULAR | Status: DC

## 2020-05-19 MED ORDER — WEGOVY 1 MG/0.5ML ~~LOC~~ SOAJ: 1.0000 mg | SUBCUTANEOUS | 5 refills | Status: DC
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
## 2020-05-20 LAB — CARDIOVASCULAR RISK ASSESSMENT

## 2020-05-20 LAB — COMPREHENSIVE METABOLIC PANEL
ALT: 14 IU/L (ref 0–32)
AST: 19 IU/L (ref 0–40)
Albumin/Globulin Ratio: 1.6 (ref 1.2–2.2)
Albumin: 4.4 g/dL (ref 3.8–4.9)
Alkaline Phosphatase: 73 IU/L (ref 48–121)
BUN/Creatinine Ratio: 28 — ABNORMAL HIGH (ref 9–23)
BUN: 25 mg/dL — ABNORMAL HIGH (ref 6–24)
Bilirubin Total: 0.2 mg/dL (ref 0.0–1.2)
CO2: 26 mmol/L (ref 20–29)
Calcium: 9.8 mg/dL (ref 8.7–10.2)
Chloride: 101 mmol/L (ref 96–106)
Creatinine, Ser: 0.88 mg/dL (ref 0.57–1.00)
GFR calc Af Amer: 86 mL/min/{1.73_m2} (ref >59)
GFR calc non Af Amer: 75 mL/min/{1.73_m2} (ref >59)
Globulin, Total: 2.7 g/dL (ref 1.5–4.5)
Glucose: 89 mg/dL (ref 65–99)
Potassium: 4.2 mmol/L (ref 3.5–5.2)
Sodium: 142 mmol/L (ref 134–144)
Total Protein: 7.1 g/dL (ref 6.0–8.5)

## 2020-05-20 LAB — CBC WITH DIFFERENTIAL/PLATELET
Basophils Absolute: 0.1 10*3/uL (ref 0.0–0.2)
Basos: 1 %
EOS (ABSOLUTE): 0.2 10*3/uL (ref 0.0–0.4)
Eos: 3 %
Hematocrit: 39.1 % (ref 34.0–46.6)
Hemoglobin: 13.2 g/dL (ref 11.1–15.9)
Immature Grans (Abs): 0 10*3/uL (ref 0.0–0.1)
Immature Granulocytes: 0 %
Lymphocytes Absolute: 2.5 10*3/uL (ref 0.7–3.1)
Lymphs: 36 %
MCH: 28.3 pg (ref 26.6–33.0)
MCHC: 33.8 g/dL (ref 31.5–35.7)
MCV: 84 fL (ref 79–97)
Monocytes Absolute: 0.5 10*3/uL (ref 0.1–0.9)
Monocytes: 7 %
Neutrophils Absolute: 3.7 10*3/uL (ref 1.4–7.0)
Neutrophils: 53 %
Platelets: 408 10*3/uL (ref 150–450)
RBC: 4.66 x10E6/uL (ref 3.77–5.28)
RDW: 13 % (ref 11.7–15.4)
WBC: 6.9 10*3/uL (ref 3.4–10.8)

## 2020-05-20 LAB — LIPID PANEL
Chol/HDL Ratio: 3.3 ratio (ref 0.0–4.4)
Cholesterol, Total: 176 mg/dL (ref 100–199)
HDL: 53 mg/dL (ref >39)
LDL Chol Calc (NIH): 73 mg/dL (ref 0–99)
Triglycerides: 315 mg/dL — ABNORMAL HIGH (ref 0–149)
VLDL Cholesterol Cal: 50 mg/dL — ABNORMAL HIGH (ref 5–40)

## 2020-05-20 LAB — TSH: TSH: 1.06 u[IU]/mL (ref 0.450–4.500)

## 2020-05-20 MED ORDER — TRIAMCINOLONE ACETONIDE 0.1 % EX CREA: 1.0000 "application " | TOPICAL_CREAM | Freq: Two times a day (BID) | CUTANEOUS | 2 refills | Status: DC

## 2020-05-20 NOTE — Progress Notes (Signed)
CBC normal, triglycerides high- watch diet, TSH 1.06 normal, BUN up, may be dehydrated, kidney and liver tests normal lp

## 2020-05-20 NOTE — Chronic Care Management (AMB) (Addendum)
Chronic Care Management Pharmacy  Name: Laura [name]  MRN: [id number] DOB: 1966-08-29  Chief Complaint/ HPI  Laura [name],  54 y.o. , female presents for their Initial CCM visit with the clinical pharmacist via telephone due to COVID-19 Pandemic.  PCP : Lillard Anes, MD  Their chronic conditions include: HTN, Asthma, Sleep apnea, GERD, Hypothyroidism, Osteoarthrosis, CKD, Fibromyalgia, Chronic pain syndrome, Vitamin D deficiency, Mixed hyperlipidemia,  Vitamin b12 deficiency, anxiety, depression, prediabetes, insomnia.   Office Visits: 05/19/2020 - CBC normal, triglycerides high- watch diet, TSH 1.06 normal, BUN up, may be dehydrated, kidney and liver tests normal. Wegovy prescribed for weight loss.  04/15/2020 - CBC normal, glucose 108, kidney tests normal, liver tests normal Cholesterol LdL high  and triglycerides high- need low cholesterol diet, A1c 6.0, urinalysis normal. Premarin for vaginal atrophy. Augmentin and fluconazole given for sinus infection. Valtrex prescribed.  02/19/2020 - Covid Vaccine.  01/24/2020 - Covid Vaccine Consult Visit: 05/15/2020 - Cardio - She will adjust her quinapril when she is comfortable with a stable blood pressure following the procedure. 05/08/2020 - Ablation for PVCs.  04/08/2020 - Lipids are up, mostly dietary. She is committed to reducing them. Continue repatha - will repeat lipids in 6 months. Plans for ablation for PVC's with Dr. Noralee Stain in [place].  03/11/2020 - Sleep Medicine - Trial of BiPAP therapy on 11/7 cm H2O with a Small size Resmed Full Face Mask AirFit F10 for Her mask and heated humidification. 03/11/2020 - Cardiology - With hx of COVID wonder whether any of her worsening symptoms are LONGHAUL-- and have suggested she reach out to [facility] whom I have been told is doing a LONGHAUL study 02/20/2020 - encouraged weight loss. Continue same medications for hypertension. Cut back on zolpidem and continue CPAP. 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  She has not tolerated multiple different medications.  We will try her on mexiletine. Reviewed side effects of mexiletine. Blood pressure is elevated.  We will add hydrochlorothiazide.  She is on furosemide; will check her potassium. 02/04/2020 - Knee arthroscopy.  01/23/2020 - Chart reviewed as part of pre-operative protocol coverage. Given past medical history and time since last visit, based on ACC/AHA guidelines, Laura [name] would be at acceptable risk for the planned procedure without further cardiovascular testing.  01/18/2020 - ablation with cardiology.  12/14/2019 - Cardiology for PVC - We will therefore proceed with catheter ablation at the next available time. Will update echo prior to procedure. BP elevated today.  12/11/2019 - Cardio for OSA - sleep study showed mild OSA. Still has some palpitations - continue multaq. HTN - bp controlled. Set up for laba CPAP titration.  12/06/2019 - Cardio - PVC - stop Rhythmol and start Multaq 400 mg bid.  11/28/2019 - cardio - Chart reviewed as part of pre-operative protocol coverage. Given past medical history and time since last visit, based on ACC/AHA guidelines, Laura [name] would be at acceptable risk for the planned procedure without further cardiovascular testing.  11/27/2019 - ortho visit for shoulder impingent. Medications: Outpatient Encounter Medications as of 05/21/2020  Medication Sig  . albuterol (VENTOLIN HFA) 108 (90 Base) MCG/ACT inhaler Inhale 2 puffs into the lungs every 4 (four) hours as needed for wheezing or shortness of breath.  . ALPRAZolam (XANAX) 1 MG tablet Take 1 mg by mouth 4 (four) times daily.   Marland Kitchen amphetamine-dextroamphetamine (ADDERALL) 30 MG tablet Take 30 mg by mouth 3 (three) times daily.   . Diclofenac  Sodium 1 % CREA Apply 2-4 g topically 2 (two) times daily as needed to knees  . EPINEPHrine 0.3 mg/0.3 mL IJ SOAJ injection Inject 0.3 mg into the muscle as needed for  anaphylaxis.  . Evolocumab (REPATHA SURECLICK) 703 MG/ML SOAJ Inject 1 Dose into the skin every 14 (fourteen) days.  . furosemide (LASIX) 40 MG tablet Take 40 mg by mouth daily.   . hydrochlorothiazide (HYDRODIURIL) 25 MG tablet TAKE 1 TABLET(25 MG) BY MOUTH TWICE DAILY (Patient taking differently: TAKE 1 TABLET(25 MG) BY MOUTH DAILY)  . levothyroxine (SYNTHROID) 125 MCG tablet Take 125 mcg by mouth daily before breakfast.  . magnesium oxide (MAG-OX) 400 MG tablet Take 1 tablet (400 mg total) by mouth daily.  . pantoprazole (PROTONIX) 40 MG tablet Take 1 tablet (40 mg total) by mouth daily. (Patient taking differently: Take 40 mg by mouth 3 (three) times a week. )  . potassium chloride SA (KLOR-CON) 20 MEQ tablet Take 20 mEq by mouth daily.   . quinapril (ACCUPRIL) 20 MG tablet Take 1 tablet (20 mg total) by mouth 2 (two) times daily.  Marland Kitchen tiZANidine (ZANAFLEX) 4 MG tablet Take 4 mg by mouth 3 (three) times daily as needed.   . triamcinolone cream (KENALOG) 0.1 % Apply 1 application topically 2 (two) times daily.  Marland Kitchen zolpidem (AMBIEN) 10 MG tablet Take 10 mg by mouth at bedtime.  . conjugated estrogens (PREMARIN) vaginal cream Place 1 Applicatorful vaginally daily. (Patient not taking: Reported on 05/21/2020)  . Semaglutide-Weight Management (WEGOVY) 1 MG/0.5ML SOAJ Inject 1 mg into the skin once a week. (Patient not taking: Reported on 05/21/2020)  . valACYclovir (VALTREX) 1000 MG tablet Take 1,000 mg by mouth 2 (two) times daily as needed. (Patient not taking: Reported on 05/21/2020)  . [DISCONTINUED] Liraglutide -Weight Management (SAXENDA) 18 MG/3ML SOPN Inject 3 mg into the skin daily.  . [DISCONTINUED] triamcinolone acetonide (KENALOG-40) injection 40 mg    No facility-administered encounter medications on file as of 05/21/2020.    Allergies  Allergen Reactions  . Meperidine Hives  . Shellfish Allergy Shortness Of Breath and Swelling  . Diltiazem Swelling  . Acebutolol Swelling  .  Amlodipine     Swelling   . Codeine Hives and Nausea And Vomiting  . Cymbalta [Duloxetine Hcl] Other (See Comments)    Made pt feel crazy  . Dexilant [Dexlansoprazole] Other (See Comments)    Abdominal pain  . Flecainide Swelling  . Gabapentin Other (See Comments)    Makes her feel crazy   . Hydrocodone Other (See Comments)    Keeps patient awake.  . Losartan     Swelling   . Metoprolol Swelling  . Mexiletine     Swelling - hands, legs, face  . Mirtazapine Swelling  . Multaq [Dronedarone] Swelling  . Omeprazole     Abdominal pain  . Oxycodone Itching  . Sectral [Acebutolol Hcl] Swelling  . Toradol [Ketorolac Tromethamine] Itching  . Tramadol     Unable to sleep, makes her itch     SDOH Screenings   Alcohol Screen: Low Risk   . Last Alcohol Screening Score (AUDIT): 0  Depression (PHQ2-9): Medium Risk  . PHQ-2 Score: 13  Financial Resource Strain:   . Difficulty of Paying Living Expenses:   Food Insecurity:   . Worried About Charity fundraiser in the Last Year:   . [place] in the Last Year:   Housing:   . Last Housing Risk Score:   Physical  Activity:   . Days of Exercise per Week:   . Minutes of Exercise per Session:   Social Connections:   . Frequency of Communication with Friends and Family:   . Frequency of Social Gatherings with Friends and Family:   . Attends Religious Services:   . Active Member of Clubs or Organizations:   . Attends Archivist Meetings:   Marland Kitchen Marital Status:   Stress:   . Feeling of Stress :   Tobacco Use: Medium Risk  . Smoking Tobacco Use: Former Smoker  . Smokeless Tobacco Use: Never Used  Transportation Needs:   . Film/video editor (Medical):   Marland Kitchen Lack of Transportation (Non-Medical):      Current Diagnosis/Assessment:  Goals Addressed            This Visit's Progress   . Pharmacy Care Plan       CARE PLAN ENTRY (see longitudinal plan of care for additional care plan information)  Current  Barriers:  . Chronic Disease Management support, education, and care coordination needs related to Hypertension and Hyperlipidemia   Hypertension BP Readings from Last 3 Encounters:  05/19/20 140/72  04/15/20 120/70  03/11/20 (!) 156/93   . Pharmacist Clinical Goal(s): o Over the next 90 days, patient will work with PharmD and providers to achieve BP goal <130/80 . Current regimen:  o Furosemide 40 mg daily o Hydrochlorothiazide 25 mg daily o Quinapril 20 mg twice daily o Potassium 20 meq daily . Interventions: o Recommended walking as tolerated for exercise. Increase exercise as tolerated to achieve goal of >150 minutes of moderate exercise each week.  . Patient self care activities - Over the next 90 days, patient will: o Check BP weekly, document, and provide at future appointments o Ensure daily salt intake < 2300 mg/day  Hyperlipidemia Lab Results  Component Value Date/Time   LDLCALC 73 05/19/2020 02:54 PM   LDLCALC 196 (H) 06/20/2019 09:08 AM   LDLDIRECT 222 (H) 04/26/2019 01:38 PM   . Pharmacist Clinical Goal(s): o Over the next 90 days, patient will work with PharmD and providers to maintain LDL goal < 100  . Current regimen:  o Repatha140 mg/ml into the skin every 14 days. . Interventions: o Discussed [employer] grant and how to manage with pharmacy to avoid being overcharged at refill. Patient will call in refill and request to be billed to [place].  o Discussed some of the healthy diet options are going to be limited with updated financial supplementation for food.  . Patient self care activities - Over the next 90 days, patient will: o Continue healthy diet with available options.  o Continue taking Repatha every 2 weeks.  o Continue staying active.   Pre-Diabetes Lab Results  Component Value Date/Time   HGBA1C 6.0 (H) 04/15/2020 01:41 PM   HGBA1C 6.0 (H) 06/20/2019 09:08 AM   . Pharmacist Clinical Goal(s): o Over the next 90 days, patient will  work with PharmD and providers to maintain A1c goal <6.5% . Current regimen:  o Wegovy 1 mg once weekly . Interventions: o Working with provider to obtain [facility] for patient.  o Keep up the good work with exercise.  . Patient self care activities - Over the next 90 days, patient will: o Begub Wegovy weekly once available.  o Continue to work on [employer] and staying active.   Medication management . Pharmacist Clinical Goal(s): o Over the next 90 days, patient will work with PharmD and providers to achieve  optimal medication adherence . Current pharmacy: [facility] . Interventions o Comprehensive medication review performed. o Continue current medication management strategy . Patient self care activities - Over the next 90 days, patient will: o Focus on medication adherence by continuing to use a pill box.  o Take medications as prescribed o Report any questions or concerns to PharmD and/or provider(s)  Initial goal documentation       Asthma    Eosinophil count:   Lab Results  Component Value Date/Time   EOSPCT 3 09/02/2019 02:42 AM   EOSPCT 1.3 06/13/2014 01:52 AM  %                               Eos (Absolute):  Lab Results  Component Value Date/Time   EOSABS 0.2 05/19/2020 02:54 PM   EOSABS 0.1 06/13/2014 01:52 AM    Tobacco Status:  Social History   Tobacco Use  Smoking Status Former Smoker  . Quit date: 04/08/1995  . Years since quitting: 25.1  Smokeless Tobacco Never Used    Patient has failed these meds in past: cetirizine, flonase, montelukast Patient is currently uncontrolled on the following medications:   Albuterol inhaler 2 puffs q4h prn wheezing/sob Using maintenance inhaler regularly? No Frequency of rescue inhaler use:  infrequently  We discussed:  proper inhaler technique. Gets wheezing/shortness of breath with exercise. Inhaler makes her feel jittery which is undesirable. Discussed Xopenex inhaler as an option if covered by  insurance. Xopenex inhaler is not listed as a covered product on insurnace. Xopenex is available as a tier 4 in nebulizer solution only. Will discuss options and cost associated with patient before consulting Dr. Henrene Pastor.   Plan  Continue current medicaitons.     Hyperlipidemia   LDL goal < 100  Lipid Panel     Component Value Date/Time   CHOL 176 05/19/2020 1454   TRIG 315 (H) 05/19/2020 1454   HDL 53 05/19/2020 1454   LDLCALC 73 05/19/2020 1454   LDLCALC 196 (H) 06/20/2019 0908   LDLDIRECT 222 (H) 04/26/2019 1338    Hepatic Function Latest Ref Rng & Units 05/19/2020 04/15/2020 11/07/2019  Total Protein 6.0 - 8.5 g/dL 7.1 7.2 7.5  Albumin 3.8 - 4.9 g/dL 4.4 4.7 4.2  AST 0 - 40 IU/L _0 ALT 0 - 32 IU/L _1 Alk Phosphatase 48 - 121 IU/L 73 72 66  Total Bilirubin 0.0 - 1.2 mg/dL 0.2 0.3 0.7  Bilirubin, Direct 0.0 - 0.3 mg/dL - - -     The 10-year ASCVD risk score Mikey Bussing DC Jr., et al., 2013) is: 2.6%   Values used to calculate the score:     Age: 41 years     Sex: Female     Is Non-Hispanic African American: No     Diabetic: No     Tobacco smoker: No     Systolic Blood Pressure: 623 mmHg     Is BP treated: Yes     HDL Cholesterol: 53 mg/dL     Total Cholesterol: 176 mg/dL   Patient has failed these meds in past: atorvastatin, cholestyramine, colesvelam, rosuvastatin Patient is currently uncontrolled on the following medications:  . Repatha 140 mg/ml into the skin every 14 days   We discussed:  diet and exercise extensively. Has a [employer] grant to make Repatha more affordable.   Plan  Continue current medications and control with diet and exercise  Hypothyroidism   Lab Results  Component Value Date/Time   TSH 1.060 05/19/2020 02:54 PM   TSH 2.20 06/20/2019 09:08 AM   FREET4 1.37 12/16/2015 04:32 PM   FREET4 1.12 11/24/2015 08:26 AM    Patient has failed these meds in past: n/a Patient is currently controlled on the following medications:   . Levothyroxine 125 mcg daily before breakfast  We discussed:  Patient indicates good adherence and good control of symptoms.   Plan  Continue current medications   Hypertension   Office blood pressures are  BP Readings from Last 3 Encounters:  05/19/20 140/72  04/15/20 120/70  03/11/20 (!) 156/93   Kidney Function Lab Results  Component Value Date/Time   CREATININE 0.88 05/19/2020 02:54 PM   CREATININE 0.76 05/14/2020 11:20 AM   CREATININE 0.88 06/20/2019 09:08 AM   CREATININE 0.86 07/10/2018 04:03 PM   GFRNONAA 75 05/19/2020 02:54 PM   GFRNONAA 75 06/20/2019 09:08 AM   GFRAA 86 05/19/2020 02:54 PM   GFRAA 87 06/20/2019 09:08 AM   K 4.2 05/19/2020 02:54 PM   K 4.4 05/14/2020 11:20 AM   K 3.8 06/13/2014 01:52 AM   K 2.0 (LL) 06/12/2014 01:45 PM    Patient has failed these meds in the past: acebutolol, amlodipine, clonidine, diltiazem, furosemide, hydralazine, irbesartan, metoprolol succinate, propanolol, torsemide, valsartan, spironolactone,  Patient is currently uncontrolled on the following medications:   Furosemide 40 mg daily  Hydrochlorothiazide 25 mg daily  Quinapril 20 mg twice daily   Potassium chloride 20 meq daily   We discussed diet and exercise extensively. Patient has tried many different diuretics due to history of hypokalemia. She didn't respond well to potassium sparing diuretics and they were ineffective.   Normal diet consists of: scrambled egg, cucumber, tomato, cherries for breakfast. May eat an apple for lunch. Dinner may be chicken with vegetables or a salad. She enjoys salmon. Has a garden and cans vegetables. She eats a lot of salads. May include some dried cherries or cranberries occasionally. Most of the time places fresh berries on it. She enjoys cantaloupe. Stays away from pineapple.  Sunday is splurge day with a pot luck kind of meal. She eats more carbs and beef or pork on those days. Denies snacking. Avoids processed or canned foods to  keep sodium out. Her food stamps are being reduced going forward which will make it more difficult to obtain healthy food. She indicates that the food pantry loads you up with unhealthy foods. During the summer she has options for fresh vegetables and healthier food.  Update 05/30/2020 - Pharmacist attempted to find a healthy option for food support since patient's food assistance will be reduced going forward. Meals on Wheels is for patients  Age 1 or above through the [employer]. [employer] is an option that the patient is already familiar with. Will continue to look for healthy food options for patient.    Plan  Continue current medications and control with diet and exercise   Insomnia   Patient has failed these meds in past: melatonin Patient is currently uncontrolled on the following medications:  Marland Kitchen Zolpidem 10 mg qhs  We discussed:  Only gets 3 hours of sleep a night with Ambien. She has terrible dreams with melatonin and unable to tolerate. Patient does not sleep well which she attributes it to her pain. She was sleeping last night but pain wakes her up. Discussed trazodone as an option but unable to tolerate in the past from psychiatrist.  Plan  Continue current medications  GERD   Patient has failed these meds in past: Dexilant, famotidine, omeprazole, ranitidine, sucralfate, nexium Patient is currently controlled on the following medications:  . Pantoprazole 40 mg three times weekly  We discussed:   Has had a GI bleed in the past. Has a hiatal hernia. The other proton pump inhibitors did not help her and made her have stomach pain/feeling of emptiness. She began taking it 3 times weekly and symptoms are mostly controlled.   Plan  Continue current medications    Obesity/Pre-Diabetes   BMP Latest Ref Rng & Units 05/19/2020 05/14/2020 04/15/2020  Glucose 65 - 99 mg/dL 89 108(H) 108(H)  BUN 6 - 24 mg/dL 25(H) 17 21  Creatinine 0.57 - 1.00 mg/dL 0.88 0.76 0.73  BUN/Creat Ratio  9 - 23 28(H) 22 29(H)  Sodium 134 - 144 mmol/L 142 140 139  Potassium 3.5 - 5.2 mmol/L 4.2 4.4 3.8  Chloride 96 - 106 mmol/L 101 101 98  CO2 20 - 29 mmol/L _0 Calcium 8.7 - 10.2 mg/dL 9.8 10.1 9.8    Patient has failed these meds in past: Saxenda, metformin Patient is currently uncontrolled on the following medications:  . Wegovy 68m/0.5 ml once a week  We discussed:  [facility] and reduce blood sugar. She thinks her samples of SKirke Shaggyhas already helped her blood sugar based on 05/19/2020 lab work results. She is waiting on approval to get [facility] Pharmacist following up with provider to determine status of approval.  Update 05/30/2020 - Company provided voucher is not available for patient since she has Medicare. Pharmacist left a message with patient and reached out to drug rep for additional options available. Will continue to work with patient on obtainable options.   Plan  Begin [phone] accessible.    Anxiety/Depression managed by Dr KToy Care  Patient has failed these meds in past: bupropion, duloxetine, hydroxyzine, mirtazapine, trazodone Patient is currently controlled on the following medications:  . Alprazolam 1 mg qid  We discussed:  Takes alprazolam for panic attacks. She takes it four times daily. She has not tolerated medications that modulate serotonin in the past. She follows-up with psychiatrist every 6 months typically unless needed. She has been unable to tolerate anti-depressants in the past. She does have bouts with depression or down days but she has no issue with suicidal. She recognizes that some people have things much harder. She can call Dr. KToy Careand get in immediately if needs.   Plan  Continue current medications  Health Maintenance   Patient is currently controlled on the following medications:  . Valtrex 1000 mg twice daily prn - occasional cold sore . Epipen pr anaphylaxis - allergy to  shellfish . Adderall 30 mg tid - attention deficit hyperactivity disorder . Magnesium 400 mg daily - supplementation . Premarin vaginal cream vaginally daily - vaginal atrophy . Tizanidine 4 mg tid prn - pain . Diclofenac 1% cream 2-4 grams bid prn knees  We discussed: Since having COVID patient is having headaches every day. Tizanidine does help with pain level some. She takes it 1 time daily if she can but does take it 3 times daily if needed for pain. Patient has torn rotator cuff and knee pain. She sees an orthopedic doctor currently. Patient has taken Adderall for years for ADHD. Dr. RChucky Maymanages ADHD.   Patient needs an updated Epipen. Pharmacist coordinating refill with provider.  Plan  Continue current medications  Vaccines   Reviewed and discussed patient's vaccination history.    Immunization History  Administered Date(s) Administered  . Influenza Split 08/15/2013  . Influenza,inj,Quad PF,6+ Mos 12/04/2014, 08/29/2015, 07/26/2016, 08/11/2017, 08/06/2019  . Influenza-Unspecified 12/05/2015  . PFIZER SARS-COV-2 Vaccination 01/24/2020, 02/19/2020  . Pneumococcal Polysaccharide-23 12/18/2015  . Tdap 08/24/2013, 05/25/2016    Plan  Recommended patient receive annual flu vaccine in office.   Medication Management   Pt uses [facility] for all medications Uses pill box? Yes Pt endorses good compliance  We discussed: Writes down on calendar when Repatha is due to help stay on track. She uses a pill box to keep her medications better organized. If she has an early doctor's appointment sometimes she forgets her medications and will take it a littler later. She notices bp increase and swelling if she misses doses of medication which helps keep it on track.   Plan  Continue current medication management strategy    Follow up: 6 month phone visit

## 2020-05-20 NOTE — Addendum Note (Signed)
Addended by: Reinaldo Meeker on: 05/20/2020 03:57 PM   Modules accepted: Orders

## 2020-05-20 NOTE — Patient Instructions (Addendum)
Visit Information  Thank you for your time discussing your medications. I look forward to working with you to achieve your health care goals. Below is a summary of what we talked about during our visit.   Goals Addressed   None     [name] was given information about Chronic Care Management services today including:  1. CCM service includes personalized support from designated clinical staff supervised by her physician, including individualized plan of care and coordination with other care providers 2. 24/7 contact phone numbers for assistance for urgent and routine care needs. 3. Standard insurance, coinsurance, copays and deductibles apply for chronic care management only during months in which we provide at least 20 minutes of these services. Most insurances cover these services at 100%, however patients may be responsible for any copay, coinsurance and/or deductible if applicable. This service may help you avoid the need for more expensive face-to-face services. 4. Only one practitioner may furnish and bill the service in a calendar month. 5. The patient may stop CCM services at any time (effective at the end of the month) by phone call to the office staff.  Patient agreed to services and verbal consent obtained.   The patient verbalized understanding of instructions provided today and agreed to receive a mailed copy of patient instruction and/or educational materials. Telephone follow up appointment with pharmacy team member scheduled for: 08/2020  Sherre Poot, PharmD Clinical Pharmacist [facility] [phone] (office) [phone] (mobile)  DASH Eating Plan DASH stands for "Dietary Approaches to Stop Hypertension." The DASH eating plan is a healthy eating plan that has been shown to reduce high blood pressure (hypertension). It may also reduce your risk for type 2 diabetes, heart disease, and stroke. The DASH eating plan may also help with weight loss. What are  tips for following this plan?  General guidelines  Avoid eating more than 2,300 mg (milligrams) of salt (sodium) a day. If you have hypertension, you may need to reduce your sodium intake to 1,500 mg a day.  Limit alcohol intake to no more than 1 drink a day for nonpregnant women and 2 drinks a day for men. One drink equals 12 oz of beer, 5 oz of wine, or 1 oz of hard liquor.  Work with your health care provider to maintain a healthy body weight or to lose weight. Ask what an ideal weight is for you.  Get at least 30 minutes of exercise that causes your heart to beat faster (aerobic exercise) most days of the week. Activities may include walking, swimming, or biking.  Work with your health care provider or diet and nutrition specialist (dietitian) to adjust your eating plan to your individual calorie needs. Reading food labels   Check food labels for the amount of sodium per serving. Choose foods with less than 5 percent of the Daily Value of sodium. Generally, foods with less than 300 mg of sodium per serving fit into this eating plan.  To find whole grains, look for the word "whole" as the first word in the ingredient list. Shopping  Buy products labeled as "low-sodium" or "no salt added."  Buy fresh foods. Avoid canned foods and premade or frozen meals. Cooking  Avoid adding salt when cooking. Use salt-free seasonings or herbs instead of table salt or sea salt. Check with your health care provider or pharmacist before using salt substitutes.  Do not fry foods. Cook foods using healthy methods such as baking, boiling, grilling, and broiling instead.  Lacinda Axon  with heart-healthy oils, such as olive, canola, soybean, or sunflower oil. Meal planning  Eat a balanced diet that includes: ? 5 or more servings of fruits and vegetables each day. At each meal, try to fill half of your plate with fruits and vegetables. ? Up to 6-8 servings of whole grains each day. ? Less than 6 oz of lean  meat, poultry, or fish each day. A 3-oz serving of meat is about the same size as a deck of cards. One egg equals 1 oz. ? 2 servings of low-fat dairy each day. ? A serving of nuts, seeds, or beans 5 times each week. ? Heart-healthy fats. Healthy fats called Omega-3 fatty acids are found in foods such as flaxseeds and coldwater fish, like sardines, salmon, and mackerel.  Limit how much you eat of the following: ? Canned or prepackaged foods. ? Food that is high in trans fat, such as fried foods. ? Food that is high in saturated fat, such as fatty meat. ? Sweets, desserts, sugary drinks, and other foods with added sugar. ? Full-fat dairy products.  Do not salt foods before eating.  Try to eat at least 2 vegetarian meals each week.  Eat more home-cooked food and less restaurant, buffet, and fast food.  When eating at a restaurant, ask that your food be prepared with less salt or no salt, if possible. What foods are recommended? The items listed may not be a complete list. Talk with your dietitian about what dietary choices are best for you. Grains Whole-grain or whole-wheat bread. Whole-grain or whole-wheat pasta. Amaka Gluth rice. Modena Morrow. Bulgur. Whole-grain and low-sodium cereals. Pita bread. Low-fat, low-sodium crackers. Whole-wheat flour tortillas. Vegetables Fresh or frozen vegetables (raw, steamed, roasted, or grilled). Low-sodium or reduced-sodium tomato and vegetable juice. Low-sodium or reduced-sodium tomato sauce and tomato paste. Low-sodium or reduced-sodium canned vegetables. Fruits All fresh, dried, or frozen fruit. Canned fruit in natural juice (without added sugar). Meat and other protein foods Skinless chicken or [place]. Ground chicken or [place]. Pork with fat trimmed off. Fish and seafood. Egg whites. Dried beans, peas, or lentils. Unsalted nuts, nut butters, and seeds. Unsalted canned beans. Lean cuts of beef with fat trimmed off. Low-sodium, lean deli  meat. Dairy Low-fat (1%) or fat-free (skim) milk. Fat-free, low-fat, or reduced-fat cheeses. Nonfat, low-sodium ricotta or cottage cheese. Low-fat or nonfat yogurt. Low-fat, low-sodium cheese. Fats and oils Soft margarine without trans fats. Vegetable oil. Low-fat, reduced-fat, or light mayonnaise and salad dressings (reduced-sodium). Canola, safflower, olive, soybean, and sunflower oils. Avocado. Seasoning and other foods Herbs. Spices. Seasoning mixes without salt. Unsalted popcorn and pretzels. Fat-free sweets. What foods are not recommended? The items listed may not be a complete list. Talk with your dietitian about what dietary choices are best for you. Grains Baked goods made with fat, such as croissants, muffins, or some breads. Dry pasta or rice meal packs. Vegetables Creamed or fried vegetables. Vegetables in a cheese sauce. Regular canned vegetables (not low-sodium or reduced-sodium). Regular canned tomato sauce and paste (not low-sodium or reduced-sodium). Regular tomato and vegetable juice (not low-sodium or reduced-sodium). Angie Fava. Olives. Fruits Canned fruit in a light or heavy syrup. Fried fruit. Fruit in cream or butter sauce. Meat and other protein foods Fatty cuts of meat. Ribs. Fried meat. Berniece Salines. Sausage. Bologna and other processed lunch meats. Salami. Fatback. Hotdogs. Bratwurst. Salted nuts and seeds. Canned beans with added salt. Canned or smoked fish. Whole eggs or egg yolks. Chicken or [place] with skin. Dairy Whole or  2% milk, cream, and half-and-half. Whole or full-fat cream cheese. Whole-fat or sweetened yogurt. Full-fat cheese. Nondairy creamers. Whipped toppings. Processed cheese and cheese spreads. Fats and oils Butter. Stick margarine. Lard. Shortening. Ghee. Bacon fat. Tropical oils, such as coconut, palm kernel, or palm oil. Seasoning and other foods Salted popcorn and pretzels. Onion salt, garlic salt, seasoned salt, table salt, and sea salt. Worcestershire  sauce. Tartar sauce. Barbecue sauce. Teriyaki sauce. Soy sauce, including reduced-sodium. Steak sauce. Canned and packaged gravies. Fish sauce. Oyster sauce. Cocktail sauce. Horseradish that you find on the shelf. Ketchup. Mustard. Meat flavorings and tenderizers. Bouillon cubes. Hot sauce and Tabasco sauce. Premade or packaged marinades. Premade or packaged taco seasonings. Relishes. Regular salad dressings. Where to find more information:  National Heart, Lung, and [place]: https://wilson-eaton.com/  American Heart Association: www.heart.org Summary  The DASH eating plan is a healthy eating plan that has been shown to reduce high blood pressure (hypertension). It may also reduce your risk for type 2 diabetes, heart disease, and stroke.  With the DASH eating plan, you should limit salt (sodium) intake to 2,300 mg a day. If you have hypertension, you may need to reduce your sodium intake to 1,500 mg a day.  When on the DASH eating plan, aim to eat more fresh fruits and vegetables, whole grains, lean proteins, low-fat dairy, and heart-healthy fats.  Work with your health care provider or diet and nutrition specialist (dietitian) to adjust your eating plan to your individual calorie needs. This information is not intended to replace advice given to you by your health care provider. Make sure you discuss any questions you have with your health care provider. Document Revised: 09/30/2017 Document Reviewed: 10/11/2016 Elsevier Patient Education  2020 [employer].

## 2020-05-21 ENCOUNTER — Ambulatory Visit: Payer: Medicare Other

## 2020-05-21 DIAGNOSIS — E782 Mixed hyperlipidemia: Secondary | ICD-10-CM

## 2020-05-21 DIAGNOSIS — I10 Essential (primary) hypertension: Secondary | ICD-10-CM

## 2020-05-22 ENCOUNTER — Telehealth: Payer: Self-pay | Admitting: *Deleted

## 2020-05-22 ENCOUNTER — Other Ambulatory Visit: Payer: Self-pay | Admitting: Family Medicine

## 2020-05-22 DIAGNOSIS — K2270 Barrett's esophagus without dysplasia: Secondary | ICD-10-CM

## 2020-05-22 NOTE — Telephone Encounter (Signed)
Left voicemail message to call back  

## 2020-05-22 NOTE — Telephone Encounter (Signed)
-----   Message from Deboraha Sprang, MD sent at 05/20/2020  9:39 PM EDT ----- Please Inform Patient that labs are normal  Thanks

## 2020-05-22 NOTE — Telephone Encounter (Signed)
Left voicemail message for patient to call back for results.  

## 2020-05-23 NOTE — Telephone Encounter (Signed)
The patient has viewed her results on MyChart

## 2020-05-23 NOTE — Telephone Encounter (Signed)
Not a patient here. 

## 2020-05-26 ENCOUNTER — Other Ambulatory Visit: Payer: Self-pay

## 2020-05-26 DIAGNOSIS — G4733 Obstructive sleep apnea (adult) (pediatric): Secondary | ICD-10-CM | POA: Diagnosis not present

## 2020-05-26 MED ORDER — EPINEPHRINE 0.3 MG/0.3ML IJ SOAJ: 0.3000 mg | INTRAMUSCULAR | 2 refills | Status: DC | PRN

## 2020-05-27 ENCOUNTER — Encounter: Payer: Self-pay | Admitting: Legal Medicine

## 2020-05-28 ENCOUNTER — Other Ambulatory Visit: Payer: Self-pay | Admitting: Legal Medicine

## 2020-05-28 ENCOUNTER — Other Ambulatory Visit: Payer: Self-pay

## 2020-05-28 ENCOUNTER — Encounter: Payer: Self-pay | Admitting: Legal Medicine

## 2020-05-28 DIAGNOSIS — E669 Obesity, unspecified: Secondary | ICD-10-CM

## 2020-05-28 DIAGNOSIS — K2270 Barrett's esophagus without dysplasia: Secondary | ICD-10-CM

## 2020-05-28 MED ORDER — PANTOPRAZOLE SODIUM 40 MG PO TBEC: 40.0000 mg | DELAYED_RELEASE_TABLET | Freq: Every day | ORAL | 2 refills | Status: DC

## 2020-05-28 MED ORDER — SAXENDA 18 MG/3ML ~~LOC~~ SOPN: 1.8000 mg | PEN_INJECTOR | Freq: Every day | SUBCUTANEOUS | 6 refills | Status: DC

## 2020-05-30 ENCOUNTER — Other Ambulatory Visit: Payer: Self-pay

## 2020-05-30 DIAGNOSIS — E782 Mixed hyperlipidemia: Secondary | ICD-10-CM

## 2020-05-30 DIAGNOSIS — I10 Essential (primary) hypertension: Secondary | ICD-10-CM

## 2020-05-30 NOTE — Telephone Encounter (Signed)
I left Laura [name] a message. I have reached out to the drug rep but the fax I received did not work as a Chief Technology Officer like we were told .

## 2020-05-30 NOTE — Progress Notes (Signed)
Patient had an appt on 05/21/2020 with Sherre Poot, PharmD.

## 2020-06-05 ENCOUNTER — Other Ambulatory Visit: Payer: Self-pay | Admitting: Internal Medicine

## 2020-06-24 DIAGNOSIS — I493 Ventricular premature depolarization: Secondary | ICD-10-CM | POA: Diagnosis not present

## 2020-06-25 DIAGNOSIS — E876 Hypokalemia: Secondary | ICD-10-CM | POA: Diagnosis not present

## 2020-06-26 DIAGNOSIS — G4733 Obstructive sleep apnea (adult) (pediatric): Secondary | ICD-10-CM | POA: Diagnosis not present

## 2020-06-30 ENCOUNTER — Ambulatory Visit (INDEPENDENT_AMBULATORY_CARE_PROVIDER_SITE_OTHER): Payer: Medicare Other | Admitting: Legal Medicine

## 2020-06-30 ENCOUNTER — Encounter: Payer: Self-pay | Admitting: Legal Medicine

## 2020-06-30 ENCOUNTER — Other Ambulatory Visit: Payer: Self-pay

## 2020-06-30 VITALS — BP 124/82 | HR 97 | Temp 98.0°F | Resp 17 | Ht 64.0 in | Wt 187.0 lb

## 2020-06-30 DIAGNOSIS — N200 Calculus of kidney: Secondary | ICD-10-CM | POA: Diagnosis not present

## 2020-06-30 DIAGNOSIS — E049 Nontoxic goiter, unspecified: Secondary | ICD-10-CM | POA: Diagnosis not present

## 2020-06-30 DIAGNOSIS — M549 Dorsalgia, unspecified: Secondary | ICD-10-CM

## 2020-06-30 DIAGNOSIS — M1711 Unilateral primary osteoarthritis, right knee: Secondary | ICD-10-CM | POA: Diagnosis not present

## 2020-06-30 LAB — POCT URINALYSIS DIP (CLINITEK)
Bilirubin, UA: NEGATIVE
Blood, UA: NEGATIVE
Glucose, UA: NEGATIVE mg/dL
Ketones, POC UA: NEGATIVE mg/dL
Leukocytes, UA: NEGATIVE
Nitrite, UA: NEGATIVE
POC PROTEIN,UA: NEGATIVE
Spec Grav, UA: 1.01 (ref 1.010–1.025)
Urobilinogen, UA: 0.2 E.U./dL
pH, UA: 6 (ref 5.0–8.0)

## 2020-06-30 MED ORDER — HYDROMORPHONE HCL 2 MG PO TABS: 1.0000 mg | ORAL_TABLET | ORAL | 0 refills | Status: DC | PRN
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
## 2020-07-01 ENCOUNTER — Encounter: Payer: Self-pay | Admitting: Legal Medicine

## 2020-07-01 ENCOUNTER — Other Ambulatory Visit: Payer: Self-pay | Admitting: Legal Medicine

## 2020-07-01 DIAGNOSIS — E039 Hypothyroidism, unspecified: Secondary | ICD-10-CM

## 2020-07-01 DIAGNOSIS — M549 Dorsalgia, unspecified: Secondary | ICD-10-CM | POA: Diagnosis not present

## 2020-07-01 DIAGNOSIS — I700 Atherosclerosis of aorta: Secondary | ICD-10-CM | POA: Diagnosis not present

## 2020-07-01 DIAGNOSIS — N200 Calculus of kidney: Secondary | ICD-10-CM | POA: Diagnosis not present

## 2020-07-01 LAB — TSH: TSH: 0.697 u[IU]/mL (ref 0.450–4.500)

## 2020-07-01 MED ORDER — LEVOTHYROXINE SODIUM 112 MCG PO TABS: 112.0000 ug | ORAL_TABLET | Freq: Every day | ORAL | 3 refills | Status: DC

## 2020-07-01 NOTE — Progress Notes (Signed)
TSH 0.697 normal,  lp

## 2020-07-04 DIAGNOSIS — I493 Ventricular premature depolarization: Secondary | ICD-10-CM | POA: Diagnosis not present

## 2020-07-10 DIAGNOSIS — I493 Ventricular premature depolarization: Secondary | ICD-10-CM | POA: Diagnosis not present

## 2020-07-14 ENCOUNTER — Other Ambulatory Visit: Payer: Self-pay

## 2020-07-14 DIAGNOSIS — N200 Calculus of kidney: Secondary | ICD-10-CM

## 2020-07-14 MED ORDER — POTASSIUM CHLORIDE CRYS ER 20 MEQ PO TBCR: 20.0000 meq | EXTENDED_RELEASE_TABLET | Freq: Every day | ORAL | 1 refills | Status: DC

## 2020-07-22 ENCOUNTER — Other Ambulatory Visit: Payer: Self-pay

## 2020-07-22 ENCOUNTER — Encounter: Payer: Self-pay | Admitting: Legal Medicine

## 2020-07-22 ENCOUNTER — Ambulatory Visit (INDEPENDENT_AMBULATORY_CARE_PROVIDER_SITE_OTHER): Payer: Medicare Other | Admitting: Legal Medicine

## 2020-07-22 ENCOUNTER — Telehealth: Payer: Self-pay

## 2020-07-22 VITALS — BP 140/70 | HR 66 | Temp 97.9°F | Resp 17 | Ht 64.0 in | Wt 181.0 lb

## 2020-07-22 DIAGNOSIS — E039 Hypothyroidism, unspecified: Secondary | ICD-10-CM | POA: Diagnosis not present

## 2020-07-22 DIAGNOSIS — Z23 Encounter for immunization: Secondary | ICD-10-CM | POA: Diagnosis not present

## 2020-07-22 DIAGNOSIS — B373 Candidiasis of vulva and vagina: Secondary | ICD-10-CM

## 2020-07-22 DIAGNOSIS — N200 Calculus of kidney: Secondary | ICD-10-CM

## 2020-07-22 DIAGNOSIS — E8881 Metabolic syndrome: Secondary | ICD-10-CM

## 2020-07-22 DIAGNOSIS — B3731 Acute candidiasis of vulva and vagina: Secondary | ICD-10-CM | POA: Insufficient documentation

## 2020-07-22 LAB — POCT URINALYSIS DIP (CLINITEK)
Bilirubin, UA: NEGATIVE
Blood, UA: NEGATIVE
Glucose, UA: NEGATIVE mg/dL
Ketones, POC UA: NEGATIVE mg/dL
Leukocytes, UA: NEGATIVE
Nitrite, UA: NEGATIVE
POC PROTEIN,UA: NEGATIVE
Spec Grav, UA: 1.015 (ref 1.010–1.025)
Urobilinogen, UA: NEGATIVE E.U./dL — AB
pH, UA: 6.5 (ref 5.0–8.0)

## 2020-07-22 MED ORDER — FLUCONAZOLE 150 MG PO TABS: 150.0000 mg | ORAL_TABLET | Freq: Once | ORAL | 2 refills | Status: AC

## 2020-07-22 MED ORDER — OZEMPIC (0.25 OR 0.5 MG/DOSE) 2 MG/1.5ML ~~LOC~~ SOPN: 0.5000 mg | PEN_INJECTOR | SUBCUTANEOUS | 6 refills | Status: DC
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
## 2020-07-22 NOTE — Telephone Encounter (Signed)
I called patient to let her know about the appointment for the CT on 07/24/2020 at 4:00 pm and also I let her know that she needs to pick up the fluid for the CT before the appointment.

## 2020-07-24 DIAGNOSIS — K828 Other specified diseases of gallbladder: Secondary | ICD-10-CM | POA: Diagnosis not present

## 2020-07-24 DIAGNOSIS — N3289 Other specified disorders of bladder: Secondary | ICD-10-CM | POA: Diagnosis not present

## 2020-07-24 DIAGNOSIS — N200 Calculus of kidney: Secondary | ICD-10-CM | POA: Diagnosis not present

## 2020-07-24 DIAGNOSIS — Z87442 Personal history of urinary calculi: Secondary | ICD-10-CM | POA: Diagnosis not present

## 2020-07-24 LAB — BASIC METABOLIC PANEL
BUN: 23 — AB (ref 4–21)
Creatinine: 1 (ref 0.5–1.1)

## 2020-07-25 ENCOUNTER — Telehealth: Payer: Self-pay

## 2020-07-25 NOTE — Telephone Encounter (Signed)
Patient was informed about the CT and she would like to go to urology in [place] or [place]. She asked if you have any suggestion.

## 2020-07-25 NOTE — Telephone Encounter (Signed)
Refer to urology, for retained stone lp

## 2020-07-27 DIAGNOSIS — G4733 Obstructive sleep apnea (adult) (pediatric): Secondary | ICD-10-CM | POA: Diagnosis not present

## 2020-07-28 ENCOUNTER — Other Ambulatory Visit: Payer: Self-pay

## 2020-07-28 DIAGNOSIS — N200 Calculus of kidney: Secondary | ICD-10-CM

## 2020-07-28 NOTE — Telephone Encounter (Signed)
I put the order in for urology associate in [place].

## 2020-07-29 ENCOUNTER — Other Ambulatory Visit: Payer: Self-pay

## 2020-07-29 ENCOUNTER — Ambulatory Visit: Payer: Medicare Other

## 2020-07-30 ENCOUNTER — Encounter: Payer: Self-pay | Admitting: Urology

## 2020-07-30 ENCOUNTER — Ambulatory Visit
Admission: RE | Admit: 2020-07-30 | Discharge: 2020-07-30 | Disposition: A | Discharge status: Home / Self Care | Payer: Medicare Other | Attending: Urology | Admitting: Urology

## 2020-07-30 ENCOUNTER — Ambulatory Visit
Admission: RE | Admit: 2020-07-30 | Discharge: 2020-07-30 | Disposition: A | Discharge status: Home / Self Care | Payer: Medicare Other | Source: Ambulatory Visit | Attending: Urology | Admitting: Urology

## 2020-07-30 ENCOUNTER — Other Ambulatory Visit: Payer: Self-pay | Admitting: Urology

## 2020-07-30 ENCOUNTER — Ambulatory Visit (INDEPENDENT_AMBULATORY_CARE_PROVIDER_SITE_OTHER): Payer: Medicare Other | Admitting: Urology

## 2020-07-30 VITALS — BP 151/88 | HR 67 | Ht 64.0 in | Wt 181.0 lb

## 2020-07-30 DIAGNOSIS — N200 Calculus of kidney: Secondary | ICD-10-CM

## 2020-07-30 DIAGNOSIS — N201 Calculus of ureter: Secondary | ICD-10-CM | POA: Diagnosis not present

## 2020-07-30 DIAGNOSIS — I878 Other specified disorders of veins: Secondary | ICD-10-CM | POA: Diagnosis not present

## 2020-07-30 NOTE — Patient Instructions (Signed)
Lithotripsy  Lithotripsy is a treatment that can sometimes help eliminate kidney stones and the pain that they cause. A form of lithotripsy, also known as extracorporeal shock wave lithotripsy, is a nonsurgical procedure that crushes a kidney stone with shock waves. These shock waves pass through your body and focus on the kidney stone. They cause the kidney stone to break up while it is still in the urinary tract. This makes it easier for the smaller pieces of stone to pass in the urine. Tell a health care provider about:  Any allergies you have.  All medicines you are taking, including vitamins, herbs, eye drops, creams, and over-the-counter medicines.  Any blood disorders you have.  Any surgeries you have had.  Any medical conditions you have.  Whether you are pregnant or may be pregnant.  Any problems you or family members have had with anesthetic medicines. What are the risks? Generally, this is a safe procedure. However, problems may occur, including:  Infection.  Bleeding of the kidney.  Bruising of the kidney or skin.  Scarring of the kidney, which can lead to: ? Increased blood pressure. ? Poor kidney function. ? Return (recurrence) of kidney stones.  Damage to other structures or organs, such as the liver, colon, spleen, or pancreas.  Blockage (obstruction) of the the tube that carries urine from the kidney to the bladder (ureter).  Failure of the kidney stone to break into pieces (fragments). What happens before the procedure? Staying hydrated Follow instructions from your health care provider about hydration, which may include:  Up to 2 hours before the procedure - you may continue to drink clear liquids, such as water, clear fruit juice, black coffee, and plain tea. Eating and drinking restrictions Follow instructions from your health care provider about eating and drinking, which may include:  8 hours before the procedure - stop eating heavy meals or foods  such as meat, fried foods, or fatty foods.  6 hours before the procedure - stop eating light meals or foods, such as toast or cereal.  6 hours before the procedure - stop drinking milk or drinks that contain milk.  2 hours before the procedure - stop drinking clear liquids. General instructions  Plan to have someone take you home from the hospital or clinic.  Ask your health care provider about: ? Changing or stopping your regular medicines. This is especially important if you are taking diabetes medicines or blood thinners. ? Taking medicines such as aspirin and ibuprofen. These medicines and other NSAIDs can thin your blood. Do not take these medicines for 7 days before your procedure if your health care provider instructs you not to.  You may have tests, such as: ? Blood tests. ? Urine tests. ? Imaging tests, such as a CT scan. What happens during the procedure?  To lower your risk of infection: ? Your health care team will wash or sanitize their hands. ? Your skin will be washed with soap.  An IV tube will be inserted into one of your veins. This tube will give you fluids and medicines.  You will be given one or more of the following: ? A medicine to help you relax (sedative). ? A medicine to make you fall asleep (general anesthetic).  A water-filled cushion may be placed behind your kidney or on your abdomen. In some cases you may be placed in a tub of lukewarm water.  Your body will be positioned in a way that makes it easy to target the kidney   stone.  A flexible tube with holes in it (stent) may be placed in the ureter. This will help keep urine flowing from the kidney if the fragments of the stone have been blocking the ureter.  An X-ray or ultrasound exam will be done to locate your stone.  Shock waves will be aimed at the stone. If you are awake, you may feel a tapping sensation as the shock waves pass through your body. The procedure may vary among health care  providers and hospitals. What happens after the procedure?  You may have an X-ray to see whether the procedure was able to break up the kidney stone and how much of the stone has passed. If large stone fragments remain after treatment, you may need to have a second procedure at a later time.  Your blood pressure, heart rate, breathing rate, and blood oxygen level will be monitored until the medicines you were given have worn off.  You may be given antibiotics or pain medicine as needed.  If a stent was placed in your ureter during surgery, it may stay in place for a few weeks.  You may need strain your urine to collect pieces of the kidney stone for testing.  You will need to drink plenty of water.  Do not drive for 24 hours if you were given a sedative. Summary  Lithotripsy is a treatment that can sometimes help eliminate kidney stones and the pain that they cause.  A form of lithotripsy, also known as extracorporeal shock wave lithotripsy, is a nonsurgical procedure that crushes a kidney stone with shock waves.  Generally, this is a safe procedure. However, problems may occur, including damage to the kidney or other organs, infection, or obstruction of the tube that carries urine from the kidney to the bladder (ureter).  When you go home, you will need to drink plenty of water. You may be asked to strain your urine to collect pieces of the kidney stone for testing. This information is not intended to replace advice given to you by your health care provider. Make sure you discuss any questions you have with your health care provider. Document Revised: 01/29/2019 Document Reviewed: 09/08/2016 Elsevier Patient Education  2020 Elsevier Inc.    Lithotripsy, Care After This sheet gives you information about how to care for yourself after your procedure. Your health care provider may also give you more specific instructions. If you have problems or questions, contact your health care  provider. What can I expect after the procedure? After the procedure, it is common to have:  Some blood in your urine. This should only last for a few days.  Soreness in your back, sides, or upper abdomen for a few days.  Blotches or bruises on your back where the pressure wave entered the skin.  Pain, discomfort, or nausea when pieces (fragments) of the kidney stone move through the tube that carries urine from the kidney to the bladder (ureter). Stone fragments may pass soon after the procedure, but they may continue to pass for up to 4-8 weeks. ? If you have severe pain or nausea, contact your health care provider. This may be caused by a large stone that was not broken up, and this may mean that you need more treatment.  Some pain or discomfort during urination.  Some pain or discomfort in the lower abdomen or (in men) at the base of the penis. Follow these instructions at home: Medicines  Take over-the-counter and prescription medicines only as told   by your health care provider.  If you were prescribed an antibiotic medicine, take it as told by your health care provider. Do not stop taking the antibiotic even if you start to feel better.  Do not drive for 24 hours if you were given a medicine to help you relax (sedative).  Do not drive or use heavy machinery while taking prescription pain medicine. Eating and drinking      Drink enough water and fluids to keep your urine clear or pale yellow. This helps any remaining pieces of the stone to pass. It can also help prevent new stones from forming.  Eat plenty of fresh fruits and vegetables.  Follow instructions from your health care provider about eating and drinking restrictions. You may be instructed: ? To reduce how much salt (sodium) you eat or drink. Check ingredients and nutrition facts on packaged foods and beverages. ? To reduce how much meat you eat.  Eat the recommended amount of calcium for your age and gender. Ask  your health care provider how much calcium you should have. General instructions  Get plenty of rest.  Most people can resume normal activities 1-2 days after the procedure. Ask your health care provider what activities are safe for you.  Your health care provider may direct you to lie in a certain position (postural drainage) and tap firmly (percuss) over your kidney area to help stone fragments pass. Follow instructions as told by your health care provider.  If directed, strain all urine through the strainer that was provided by your health care provider. ? Keep all fragments for your health care provider to see. Any stones that are found may be sent to a medical lab for examination. The stone may be as small as a grain of salt.  Keep all follow-up visits as told by your health care provider. This is important. Contact a health care provider if:  You have pain that is severe or does not get better with medicine.  You have nausea that is severe or does not go away.  You have blood in your urine longer than your health care provider told you to expect.  You have more blood in your urine.  You have pain during urination that does not go away.  You urinate more frequently than usual and this does not go away.  You develop a rash or any other possible signs of an allergic reaction. Get help right away if:  You have severe pain in your back, sides, or upper abdomen.  You have severe pain while urinating.  Your urine is very dark red.  You have blood in your stool (feces).  You cannot pass any urine at all.  You feel a strong urge to urinate after emptying your bladder.  You have a fever or chills.  You develop shortness of breath, difficulty breathing, or chest pain.  You have severe nausea that leads to persistent vomiting.  You faint. Summary  After this procedure, it is common to have some pain, discomfort, or nausea when pieces (fragments) of the kidney stone move  through the tube that carries urine from the kidney to the bladder (ureter). If this pain or nausea is severe, however, you should contact your health care provider.  Most people can resume normal activities 1-2 days after the procedure. Ask your health care provider what activities are safe for you.  Drink enough water and fluids to keep your urine clear or pale yellow. This helps any remaining pieces of   the stone to pass, and it can help prevent new stones from forming.  If directed, strain your urine and keep all fragments for your health care provider to see. Fragments or stones may be as small as a grain of salt.  Get help right away if you have severe pain in your back, sides, or upper abdomen or have severe pain while urinating. This information is not intended to replace advice given to you by your health care provider. Make sure you discuss any questions you have with your health care provider. Document Revised: 01/29/2019 Document Reviewed: 09/08/2016 Elsevier Patient Education  2020 Elsevier Inc.  
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
## 2020-07-31 ENCOUNTER — Other Ambulatory Visit: Payer: Self-pay

## 2020-07-31 ENCOUNTER — Encounter: Payer: Self-pay | Admitting: Urology

## 2020-07-31 ENCOUNTER — Ambulatory Visit
Admission: RE | Admit: 2020-07-31 | Discharge: 2020-07-31 | Disposition: A | Discharge status: Home / Self Care | Payer: Medicare Other | Attending: Urology | Admitting: Urology

## 2020-07-31 ENCOUNTER — Ambulatory Visit: Payer: Medicare Other

## 2020-07-31 ENCOUNTER — Encounter
Admission: RE | Disposition: A | Discharge status: Home / Self Care | Payer: Self-pay | Source: Home / Self Care | Attending: Urology

## 2020-07-31 DIAGNOSIS — Z87442 Personal history of urinary calculi: Secondary | ICD-10-CM | POA: Diagnosis not present

## 2020-07-31 DIAGNOSIS — E669 Obesity, unspecified: Secondary | ICD-10-CM | POA: Diagnosis not present

## 2020-07-31 DIAGNOSIS — Z800 Family history of malignant neoplasm of digestive organs: Secondary | ICD-10-CM | POA: Diagnosis not present

## 2020-07-31 DIAGNOSIS — N200 Calculus of kidney: Secondary | ICD-10-CM

## 2020-07-31 DIAGNOSIS — Z6831 Body mass index (BMI) 31.0-31.9, adult: Secondary | ICD-10-CM | POA: Diagnosis not present

## 2020-07-31 DIAGNOSIS — Z803 Family history of malignant neoplasm of breast: Secondary | ICD-10-CM | POA: Diagnosis not present

## 2020-07-31 DIAGNOSIS — Z8379 Family history of other diseases of the digestive system: Secondary | ICD-10-CM | POA: Diagnosis not present

## 2020-07-31 DIAGNOSIS — Z87891 Personal history of nicotine dependence: Secondary | ICD-10-CM | POA: Insufficient documentation

## 2020-07-31 DIAGNOSIS — K589 Irritable bowel syndrome without diarrhea: Secondary | ICD-10-CM | POA: Diagnosis not present

## 2020-07-31 DIAGNOSIS — R7303 Prediabetes: Secondary | ICD-10-CM | POA: Diagnosis not present

## 2020-07-31 DIAGNOSIS — I10 Essential (primary) hypertension: Secondary | ICD-10-CM | POA: Diagnosis not present

## 2020-07-31 DIAGNOSIS — G4730 Sleep apnea, unspecified: Secondary | ICD-10-CM | POA: Insufficient documentation

## 2020-07-31 DIAGNOSIS — Z823 Family history of stroke: Secondary | ICD-10-CM | POA: Insufficient documentation

## 2020-07-31 DIAGNOSIS — M797 Fibromyalgia: Secondary | ICD-10-CM | POA: Diagnosis not present

## 2020-07-31 DIAGNOSIS — N201 Calculus of ureter: Secondary | ICD-10-CM | POA: Diagnosis not present

## 2020-07-31 DIAGNOSIS — Z833 Family history of diabetes mellitus: Secondary | ICD-10-CM | POA: Insufficient documentation

## 2020-07-31 DIAGNOSIS — E7800 Pure hypercholesterolemia, unspecified: Secondary | ICD-10-CM | POA: Diagnosis not present

## 2020-07-31 DIAGNOSIS — I499 Cardiac arrhythmia, unspecified: Secondary | ICD-10-CM | POA: Diagnosis not present

## 2020-07-31 DIAGNOSIS — I878 Other specified disorders of veins: Secondary | ICD-10-CM | POA: Diagnosis not present

## 2020-07-31 HISTORY — PX: EXTRACORPOREAL SHOCK WAVE LITHOTRIPSY: SHX1557

## 2020-07-31 SURGERY — LITHOTRIPSY, ESWL
Anesthesia: Moderate Sedation | Laterality: Left

## 2020-07-31 MED ORDER — HYDROMORPHONE HCL 2 MG PO TABS: 1.0000 mg | ORAL_TABLET | ORAL | 0 refills | Status: AC | PRN

## 2020-07-31 MED ORDER — SODIUM CHLORIDE 0.9 % IV SOLN: INTRAVENOUS | Status: DC

## 2020-07-31 MED ORDER — HYDROMORPHONE HCL 2 MG PO TABS
1.0000 mg | ORAL_TABLET | Freq: Once | ORAL | Status: DC
Start: 2020-07-31 — End: 2020-07-31

## 2020-07-31 MED ORDER — HYDROMORPHONE HCL 2 MG PO TABS
ORAL_TABLET | ORAL | Status: AC
  Filled 2020-07-31: qty 1

## 2020-07-31 MED ORDER — DIPHENHYDRAMINE HCL 25 MG PO CAPS
ORAL_CAPSULE | ORAL | Status: AC
  Administered 2020-07-31: 25 mg via ORAL
  Filled 2020-07-31: qty 1

## 2020-07-31 MED ORDER — TAMSULOSIN HCL 0.4 MG PO CAPS: 0.4000 mg | ORAL_CAPSULE | Freq: Every day | ORAL | 0 refills | Status: DC

## 2020-07-31 MED ORDER — ONDANSETRON HCL 4 MG/2ML IJ SOLN
INTRAMUSCULAR | Status: AC
  Administered 2020-07-31: 4 mg via INTRAVENOUS
  Filled 2020-07-31: qty 2

## 2020-07-31 MED ORDER — CIPROFLOXACIN HCL 500 MG PO TABS: 500.0000 mg | ORAL_TABLET | ORAL | Status: AC

## 2020-07-31 MED ORDER — ONDANSETRON HCL 4 MG/2ML IJ SOLN: 4.0000 mg | Freq: Once | INTRAMUSCULAR | Status: AC | PRN

## 2020-07-31 MED ORDER — DIPHENHYDRAMINE HCL 25 MG PO CAPS: 25.0000 mg | ORAL_CAPSULE | ORAL | Status: AC

## 2020-07-31 MED ORDER — DIAZEPAM 5 MG PO TABS: 10.0000 mg | ORAL_TABLET | ORAL | Status: AC

## 2020-07-31 MED ORDER — CIPROFLOXACIN HCL 500 MG PO TABS
ORAL_TABLET | ORAL | Status: AC
  Administered 2020-07-31: 500 mg via ORAL
  Filled 2020-07-31: qty 1

## 2020-07-31 MED ORDER — DIAZEPAM 5 MG PO TABS
ORAL_TABLET | ORAL | Status: AC
  Administered 2020-07-31: 10 mg via ORAL
  Filled 2020-07-31: qty 2

## 2020-07-31 NOTE — Discharge Instructions (Signed)

## 2020-07-31 NOTE — Progress Notes (Signed)
   07/31/20 0750  Clinical Encounter Type  Visited With Family  Visit Type Initial  Referral From Chaplain  Consult/Referral To Chaplain  While rounding [facility] waiting area, chaplain briefly visited with Pt's good friend, Joelene Millin. Joelene Millin said she was bored, but doing fine. She didn't have any questions or concerns.

## 2020-08-01 ENCOUNTER — Encounter: Payer: Self-pay | Admitting: Legal Medicine

## 2020-08-01 ENCOUNTER — Other Ambulatory Visit: Payer: Self-pay

## 2020-08-01 DIAGNOSIS — N200 Calculus of kidney: Secondary | ICD-10-CM

## 2020-08-04 DIAGNOSIS — N261 Atrophy of kidney (terminal): Secondary | ICD-10-CM | POA: Diagnosis not present

## 2020-08-04 DIAGNOSIS — I10 Essential (primary) hypertension: Secondary | ICD-10-CM | POA: Diagnosis not present

## 2020-08-04 DIAGNOSIS — Z87442 Personal history of urinary calculi: Secondary | ICD-10-CM | POA: Diagnosis not present

## 2020-08-04 DIAGNOSIS — R600 Localized edema: Secondary | ICD-10-CM | POA: Diagnosis not present

## 2020-08-05 ENCOUNTER — Telehealth: Payer: Medicare Other

## 2020-08-05 NOTE — H&P (Signed)
UROLOGY H&P UPDATE  Agree with prior H&P dated 9/29.  Cardiac: RRR Lungs: CTA bilaterally  Laterality: left Procedure: shockwave lithotripsy  Urine: UA 9/21 benign  Informed consent obtained, we specifically discussed the risks of bleeding, infection, post-operative pain, need for additional procedures, steinstrasse/obstructive fragments, and persistent pain secondary to other etiology besides non-obstructive stone.  Billey Co, MD 08/05/2020

## 2020-08-05 NOTE — Chronic Care Management (AMB) (Deleted)
Chronic Care Management Pharmacy  Name: Laura [name]  MRN: [id number] DOB: May 17, 1966  Chief Complaint/ HPI  Laura [name],  54 y.o. , female presents for their Follow-Up CCM visit with the clinical pharmacist via telephone due to COVID-19 Pandemic.  PCP : Laura Anes, MD  Their chronic conditions include: HTN, Asthma, Sleep apnea, GERD, Hypothyroidism, Osteoarthrosis, CKD, Fibromyalgia, Chronic pain syndrome, Vitamin D deficiency, Mixed hyperlipidemia,  Vitamin b12 deficiency, anxiety, depression, prediabetes, insomnia.   Office Visits: 07/22/2020 - fluconazole for vaginitis. Ozempic for weight loss.  06/30/2020 - ordered stat CT abdomen and pelvis for renal stones. Dilaudid for pain.  05/19/2020 - CBC normal, triglycerides high- watch diet, TSH 1.06 normal, BUN up, may be dehydrated, kidney and liver tests normal. Wegovy prescribed for weight loss.  04/15/2020 - CBC normal, glucose 108, kidney tests normal, liver tests normal Cholesterol LdL high  and triglycerides high- need low cholesterol diet, A1c 6.0, urinalysis normal. Premarin for vaginal atrophy. Augmentin and fluconazole given for sinus infection. Valtrex prescribed.  02/19/2020 - Covid Vaccine.  01/24/2020 - Covid Vaccine Consult Visit: 08/04/2020 - Nephrology - *** 07/30/2020 - Urology - lithotripsy for stone scheduled 09/30. Marland Kitchen  05/15/2020 - Cardio - She will adjust her quinapril when she is comfortable with a stable blood pressure following the procedure. 05/08/2020 - Ablation for PVCs.  04/08/2020 - Lipids are up, mostly dietary. She is committed to reducing them. Continue repatha - will repeat lipids in 6 months. Plans for ablation for PVC's with Dr. Noralee [name] in [place].  03/11/2020 - Sleep Medicine - Trial of BiPAP therapy on 11/7 cm H2O with a Small size Resmed Full Face Mask AirFit F10 for Her mask and heated humidification. 03/11/2020 - Cardiology - With hx of COVID wonder whether any of her worsening  symptoms are LONGHAUL-- and have suggested she reach out to [facility] whom I have been told is doing a LONGHAUL study 02/20/2020 - encouraged weight loss. Continue same medications for hypertension. Cut back on zolpidem and continue CPAP. 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  She has not tolerated multiple different medications.  We will try her on mexiletine. Reviewed side effects of mexiletine. Blood pressure is elevated.  We will add hydrochlorothiazide.  She is on furosemide; will check her potassium. 02/04/2020 - Knee arthroscopy.  01/23/2020 - Chart reviewed as part of pre-operative protocol coverage. Given past medical history and time since last visit, based on ACC/AHA guidelines, Laura [name] would be at acceptable risk for the planned procedure without further cardiovascular testing.  01/18/2020 - ablation with cardiology.  12/14/2019 - Cardiology for PVC - We will therefore proceed with catheter ablation at the next available time. Will update echo prior to procedure. BP elevated today.  12/11/2019 - Cardio for OSA - sleep study showed mild OSA. Still has some palpitations - continue multaq. HTN - bp controlled. Set up for laba CPAP titration.  12/06/2019 - Cardio - PVC - stop Rhythmol and start Multaq 400 mg bid.  11/28/2019 - cardio - Chart reviewed as part of pre-operative protocol coverage. Given past medical history and time since last visit, based on ACC/AHA guidelines, Laura [name] would be at acceptable risk for the planned procedure without further cardiovascular testing.  11/27/2019 - ortho visit for shoulder impingent. Medications: Outpatient Encounter Medications as of 08/05/2020  Medication Sig  . albuterol (VENTOLIN HFA) 108 (90 Base) MCG/ACT inhaler Inhale 2 puffs into the lungs every 4 (four) hours as needed for wheezing  or shortness of breath.  . ALPRAZolam (XANAX) 1 MG tablet Take 1 mg by mouth 4 (four) times daily.   Marland Kitchen amphetamine-dextroamphetamine  (ADDERALL) 30 MG tablet Take 30 mg by mouth 3 (three) times daily.   Marland Kitchen EPINEPHrine 0.3 mg/0.3 mL IJ SOAJ injection Inject 0.3 mLs (0.3 mg total) into the muscle as needed for anaphylaxis.  . Evolocumab (REPATHA SURECLICK) 974 MG/ML SOAJ Inject 1 Dose into the skin every 14 (fourteen) days.  . furosemide (LASIX) 40 MG tablet Take 40 mg by mouth daily.   . hydrochlorothiazide (HYDRODIURIL) 25 MG tablet TAKE 1 TABLET(25 MG) BY MOUTH TWICE DAILY  . HYDROmorphone (DILAUDID) 2 MG tablet Take 0.5 tablets (1 mg total) by mouth every 4 (four) hours as needed for severe pain.  Marland Kitchen levothyroxine (SYNTHROID) 112 MCG tablet Take 1 tablet (112 mcg total) by mouth daily.  . magnesium oxide (MAG-OX) 400 MG tablet Take 1 tablet (400 mg total) by mouth daily.  . pantoprazole (PROTONIX) 40 MG tablet Take 1 tablet (40 mg total) by mouth daily.  . potassium chloride SA (KLOR-CON) 20 MEQ tablet Take 1 tablet (20 mEq total) by mouth daily.  . quinapril (ACCUPRIL) 20 MG tablet Take 1 tablet (20 mg total) by mouth 2 (two) times daily.  . Semaglutide,0.25 or 0.5MG/DOS, (OZEMPIC, 0.25 OR 0.5 MG/DOSE,) 2 MG/1.5ML SOPN Inject 0.5 mg into the skin once a week.  . tamsulosin (FLOMAX) 0.4 MG CAPS capsule Take 1 capsule (0.4 mg total) by mouth daily after supper.  Marland Kitchen tiZANidine (ZANAFLEX) 4 MG tablet Take 4 mg by mouth 3 (three) times daily as needed.   . triamcinolone cream (KENALOG) 0.1 % Apply 1 application topically 2 (two) times daily.  . valACYclovir (VALTREX) 1000 MG tablet Take 1,000 mg by mouth 2 (two) times daily as needed.   . zolpidem (AMBIEN) 10 MG tablet Take 10 mg by mouth at bedtime.   No facility-administered encounter medications on file as of 08/05/2020.    Allergies  Allergen Reactions  . Meperidine Hives  . Shellfish Allergy Shortness Of Breath and Swelling  . Diltiazem Swelling  . Acebutolol Swelling  . Amlodipine     Swelling   . Codeine Hives and Nausea And Vomiting  . Cymbalta [Duloxetine Hcl]  Other (See Comments)    Made pt feel crazy  . Dexilant [Dexlansoprazole] Other (See Comments)    Abdominal pain  . Flecainide Swelling  . Gabapentin Other (See Comments)    Makes her feel crazy   . Hydrocodone Other (See Comments)    Keeps patient awake.  . Losartan     Swelling   . Metoprolol Swelling  . Mexiletine     Swelling - hands, legs, face  . Mirtazapine Swelling  . Multaq [Dronedarone] Swelling  . Omeprazole     Abdominal pain  . Oxycodone Itching  . Sectral [Acebutolol Hcl] Swelling  . Toradol [Ketorolac Tromethamine] Itching  . Tramadol     Unable to sleep, makes her itch     SDOH Screenings   Alcohol Screen:   . Last Alcohol Screening Score (AUDIT): Not on file  Depression (PHQ2-9):   . PHQ-2 Score: Not on file  Financial Resource Strain:   . Difficulty of Paying Living Expenses: Not on file  Food Insecurity: Food Insecurity Present  . Worried About Charity fundraiser in the Last Year: Often true  . Ran Out of Food in the Last Year: Often true  Housing:   . Last Housing Risk Score:  Not on file  Physical Activity:   . Days of Exercise per Week: Not on file  . Minutes of Exercise per Session: Not on file  Social Connections:   . Frequency of Communication with Friends and Family: Not on file  . Frequency of Social Gatherings with Friends and Family: Not on file  . Attends Religious Services: Not on file  . Active Member of Clubs or Organizations: Not on file  . Attends Archivist Meetings: Not on file  . Marital Status: Not on file  Stress:   . Feeling of Stress : Not on file  Tobacco Use: Medium Risk  . Smoking Tobacco Use: Former Smoker  . Smokeless Tobacco Use: Never Used  Transportation Needs:   . Film/video editor (Medical): Not on file  . Lack of Transportation (Non-Medical): Not on file     Current Diagnosis/Assessment:  Goals Addressed   None    Asthma    Eosinophil count:   Lab Results  Component Value  Date/Time   EOSPCT 3 09/02/2019 02:42 AM   EOSPCT 1.3 06/13/2014 01:52 AM  %                               Eos (Absolute):  Lab Results  Component Value Date/Time   EOSABS 0.2 05/19/2020 02:54 PM   EOSABS 0.1 06/13/2014 01:52 AM    Tobacco Status:  Social History   Tobacco Use  Smoking Status Former Smoker  . Quit date: 04/08/1995  . Years since quitting: 25.3  Smokeless Tobacco Never Used    Patient has failed these meds in past: cetirizine, flonase, montelukast Patient is currently uncontrolled on the following medications:   Albuterol inhaler 2 puffs q4h prn wheezing/sob Using maintenance inhaler regularly? No Frequency of rescue inhaler use:  infrequently  We discussed:  proper inhaler technique. Gets wheezing/shortness of breath with exercise. Inhaler makes her feel jittery which is undesirable. Discussed Xopenex inhaler as an option if covered by insurance. Xopenex inhaler is not listed as a covered product on insurnace. Xopenex is available as a tier 4 in nebulizer solution only. Will discuss options and cost associated with patient before consulting Dr. Henrene Pastor.   Plan  Continue current medicaitons.     Hyperlipidemia   LDL goal < 100  Lipid Panel     Component Value Date/Time   CHOL 176 05/19/2020 1454   TRIG 315 (H) 05/19/2020 1454   HDL 53 05/19/2020 1454   LDLCALC 73 05/19/2020 1454   LDLCALC 196 (H) 06/20/2019 0908   LDLDIRECT 222 (H) 04/26/2019 1338    Hepatic Function Latest Ref Rng & Units 05/19/2020 04/15/2020 11/07/2019  Total Protein 6.0 - 8.5 g/dL 7.1 7.2 7.5  Albumin 3.8 - 4.9 g/dL 4.4 4.7 4.2  AST 0 - 40 IU/L 19 19 20   ALT 0 - 32 IU/L 14 18 21   Alk Phosphatase 48 - 121 IU/L 73 72 66  Total Bilirubin 0.0 - 1.2 mg/dL 0.2 0.3 0.7  Bilirubin, Direct 0.0 - 0.3 mg/dL - - -     The 10-year ASCVD risk score Mikey Bussing DC Jr., et al., 2013) is: 3%   Values used to calculate the score:     Age: 32 years     Sex: Female     Is Non-Hispanic African  American: No     Diabetic: No     Tobacco smoker: No     Systolic Blood Pressure: 578  mmHg     Is BP treated: Yes     HDL Cholesterol: 53 mg/dL     Total Cholesterol: 176 mg/dL   Patient has failed these meds in past: atorvastatin, cholestyramine, colesvelam, rosuvastatin Patient is currently uncontrolled on the following medications:  . Repatha 140 mg/ml into the skin every 14 days   We discussed:  diet and exercise extensively. Has a [employer] grant to make Repatha more affordable.   Plan  Continue current medications and control with diet and exercise  Hypothyroidism   Lab Results  Component Value Date/Time   TSH 0.697 06/30/2020 03:21 PM   TSH 1.060 05/19/2020 02:54 PM   FREET4 1.37 12/16/2015 04:32 PM   FREET4 1.12 11/24/2015 08:26 AM    Patient has failed these meds in past: n/a Patient is currently controlled on the following medications:  . Levothyroxine 125 mcg daily before breakfast  We discussed:  Patient indicates good adherence and good control of symptoms.   Plan  Continue current medications   Hypertension   Office blood pressures are  BP Readings from Last 3 Encounters:  07/31/20 114/68  07/30/20 (!) 151/88  07/22/20 140/70   Kidney Function Lab Results  Component Value Date/Time   CREATININE 1.0 07/24/2020 12:00 AM   CREATININE 0.88 05/19/2020 02:54 PM   CREATININE 0.76 05/14/2020 11:20 AM   CREATININE 0.88 06/20/2019 09:08 AM   CREATININE 0.86 07/10/2018 04:03 PM   GFRNONAA 75 05/19/2020 02:54 PM   GFRNONAA 75 06/20/2019 09:08 AM   GFRAA 86 05/19/2020 02:54 PM   GFRAA 87 06/20/2019 09:08 AM   K 4.2 05/19/2020 02:54 PM   K 4.4 05/14/2020 11:20 AM   K 3.8 06/13/2014 01:52 AM   K 2.0 (LL) 06/12/2014 01:45 PM    Patient has failed these meds in the past: acebutolol, amlodipine, clonidine, diltiazem, furosemide, hydralazine, irbesartan, metoprolol succinate, propanolol, torsemide, valsartan, spironolactone,  Patient is currently  uncontrolled on the following medications:   Furosemide 40 mg daily  Hydrochlorothiazide 25 mg daily  Quinapril 20 mg twice daily   Potassium chloride 20 meq daily   We discussed diet and exercise extensively. Patient has tried many different diuretics due to history of hypokalemia. She didn't respond well to potassium sparing diuretics and they were ineffective.   Normal diet consists of: scrambled egg, cucumber, tomato, cherries for breakfast. May eat an apple for lunch. Dinner may be chicken with vegetables or a salad. She enjoys salmon. Has a garden and cans vegetables. She eats a lot of salads. May include some dried cherries or cranberries occasionally. Most of the time places fresh berries on it. She enjoys cantaloupe. Stays away from pineapple.  Sunday is splurge day with a pot luck kind of meal. She eats more carbs and beef or pork on those days. Denies snacking. Avoids processed or canned foods to keep sodium out. Her food stamps are being reduced going forward which will make it more difficult to obtain healthy food. She indicates that the food pantry loads you up with unhealthy foods. During the summer she has options for fresh vegetables and healthier food.  Update 05/30/2020 - Pharmacist attempted to find a healthy option for food support since patient's food assistance will be reduced going forward. Meals on Wheels is for patients  Age 40 or above through the [employer]. [employer] is an option that the patient is already familiar with. Will continue to look for healthy food options for patient.    Plan  Continue current medications  and control with diet and exercise   Insomnia   Patient has failed these meds in past: melatonin Patient is currently uncontrolled on the following medications:  Marland Kitchen Zolpidem 10 mg qhs  We discussed:  Only gets 3 hours of sleep a night with Ambien. She has terrible dreams with melatonin and unable to tolerate. Patient does not sleep well which she  attributes it to her pain. She was sleeping last night but pain wakes her up. Discussed trazodone as an option but unable to tolerate in the past from psychiatrist.   Plan  Continue current medications  GERD   Patient has failed these meds in past: Dexilant, famotidine, omeprazole, ranitidine, sucralfate, nexium Patient is currently controlled on the following medications:  . Pantoprazole 40 mg three times weekly  We discussed:   Has had a GI bleed in the past. Has a hiatal hernia. The other proton pump inhibitors did not help her and made her have stomach pain/feeling of emptiness. She began taking it 3 times weekly and symptoms are mostly controlled.   Plan  Continue current medications    Obesity/Pre-Diabetes   BMP Latest Ref Rng & Units 07/24/2020 05/19/2020 05/14/2020  Glucose 65 - 99 mg/dL - 89 108(H)  BUN 4 - 21 23(A) 25(H) 17  Creatinine 0.5 - 1.1 1.0 0.88 0.76  BUN/Creat Ratio 9 - 23 - 28(H) 22  Sodium 134 - 144 mmol/L - 142 140  Potassium 3.5 - 5.2 mmol/L - 4.2 4.4  Chloride 96 - 106 mmol/L - 101 101  CO2 20 - 29 mmol/L - 26 25  Calcium 8.7 - 10.2 mg/dL - 9.8 10.1    Patient has failed these meds in past: Saxenda, metformin Patient is currently uncontrolled on the following medications:  . Ozempic 0.25 mg weekly   We discussed:  Patient hopes to get [facility] approved to manage her weight loss and reduce blood sugar. She thinks her samples of Kirke Shaggy has already helped her blood sugar based on 05/19/2020 lab work results. She is waiting on approval to get [facility]. Pharmacist following up with provider to determine status of approval.  Update 05/30/2020 - Company provided voucher is not available for patient since she has Medicare. Pharmacist left a message with patient and reached out to drug rep for additional options available. Will continue to work with patient on obtainable options.   Plan  Begin [phone] once accessible.    Anxiety/Depression managed by Dr Toy Care    Patient has failed these meds in past: bupropion, duloxetine, hydroxyzine, mirtazapine, trazodone Patient is currently controlled on the following medications:  . Alprazolam 1 mg qid  We discussed:  Takes alprazolam for panic attacks. She takes it four times daily. She has not tolerated medications that modulate serotonin in the past. She follows-up with psychiatrist every 6 months typically unless needed. She has been unable to tolerate anti-depressants in the past. She does have bouts with depression or down days but she has no issue with suicidal. She recognizes that some people have things much harder. She can call Dr. Toy Care and get in immediately if needs.   Plan  Continue current medications  Health Maintenance   Patient is currently controlled on the following medications:  . Valtrex 1000 mg twice daily prn - occasional cold sore . Epipen pr anaphylaxis - allergy to shellfish . Adderall 30 mg tid - attention deficit hyperactivity disorder . Magnesium 400 mg daily - supplementation . Premarin vaginal cream vaginally daily - vaginal atrophy . Tizanidine 4  mg tid prn - pain . Diclofenac 1% cream 2-4 grams bid prn knees  We discussed: Since having COVID patient is having headaches every day. Tizanidine does help with pain level some. She takes it 1 time daily if she can but does take it 3 times daily if needed for pain. Patient has torn rotator cuff and knee pain. She sees an orthopedic doctor currently. Patient has taken Adderall for years for ADHD. Dr. Chucky May manages ADHD.   Patient needs an updated Epipen. Pharmacist coordinating refill with provider.   Plan  Continue current medications  Vaccines   Reviewed and discussed patient's vaccination history.    Immunization History  Administered Date(s) Administered  . Influenza Inj Mdck Quad Pf 07/22/2020  . Influenza Split 08/15/2013  . Influenza,inj,Quad PF,6+ Mos 12/04/2014, 08/29/2015, 07/26/2016, 08/11/2017,  08/06/2019  . Influenza-Unspecified 12/05/2015  . PFIZER SARS-COV-2 Vaccination 01/24/2020, 02/19/2020  . Pneumococcal Polysaccharide-23 12/18/2015  . Tdap 08/24/2013, 05/25/2016    Plan  Recommended patient receive annual flu vaccine in office.   Medication Management   Pt uses [facility] for all medications Uses pill box? Yes Pt endorses good compliance  We discussed: Writes down on calendar when Repatha is due to help stay on track. She uses a pill box to keep her medications better organized. If she has an early doctor's appointment sometimes she forgets her medications and will take it a littler later. She notices bp increase and swelling if she misses doses of medication which helps keep it on track.   Plan  Continue current medication management strategy    Follow up: 6 month phone visit

## 2020-08-06 ENCOUNTER — Telehealth: Payer: Medicare Other

## 2020-08-06 NOTE — Chronic Care Management (AMB) (Deleted)
Chronic Care Management Pharmacy  Name: Laura [name]  MRN: [id number] DOB: 03-09-1966  Chief Complaint/ HPI  Laura [name],  54 y.o. , female presents for their Follow-Up CCM visit with the clinical pharmacist via telephone due to COVID-19 Pandemic.  PCP : Lillard Anes, MD  Their chronic conditions include: HTN, Asthma, Sleep apnea, GERD, Hypothyroidism, Osteoarthrosis, CKD, Fibromyalgia, Chronic pain syndrome, Vitamin D deficiency, Mixed hyperlipidemia,  Vitamin b12 deficiency, anxiety, depression, prediabetes, insomnia.   Office Visits: 07/22/2020 - fluconazole for vaginitis. Ozempic for weight loss.  06/30/2020 - ordered stat CT abdomen and pelvis for renal stones. Dilaudid for pain.  05/19/2020 - CBC normal, triglycerides high- watch diet, TSH 1.06 normal, BUN up, may be dehydrated, kidney and liver tests normal. Wegovy prescribed for weight loss.  04/15/2020 - CBC normal, glucose 108, kidney tests normal, liver tests normal Cholesterol LdL high  and triglycerides high- need low cholesterol diet, A1c 6.0, urinalysis normal. Premarin for vaginal atrophy. Augmentin and fluconazole given for sinus infection. Valtrex prescribed.  02/19/2020 - Covid Vaccine.  01/24/2020 - Covid Vaccine Consult Visit: 08/04/2020 - Nephrology - *** 07/30/2020 - Urology - lithotripsy for stone scheduled 09/30. Marland Kitchen  05/15/2020 - Cardio - She will adjust her quinapril when she is comfortable with a stable blood pressure following the procedure. 05/08/2020 - Ablation for PVCs.  04/08/2020 - Lipids are up, mostly dietary. She is committed to reducing them. Continue repatha - will repeat lipids in 6 months. Plans for ablation for PVC's with Dr. Noralee Stain in [place].  03/11/2020 - Sleep Medicine - Trial of BiPAP therapy on 11/7 cm H2O with a Small size Resmed Full Face Mask AirFit F10 for Her mask and heated humidification. 03/11/2020 - Cardiology - With hx of COVID wonder whether any of her worsening  symptoms are LONGHAUL-- and have suggested she reach out to [facility] whom I have been told is doing a LONGHAUL study 02/20/2020 - encouraged weight loss. Continue same medications for hypertension. Cut back on zolpidem and continue CPAP. 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  She has not tolerated multiple different medications.  We will try her on mexiletine. Reviewed side effects of mexiletine. Blood pressure is elevated.  We will add hydrochlorothiazide.  She is on furosemide; will check her potassium. 02/04/2020 - Knee arthroscopy.  01/23/2020 - Chart reviewed as part of pre-operative protocol coverage. Given past medical history and time since last visit, based on ACC/AHA guidelines, Ellia I Atkins would be at acceptable risk for the planned procedure without further cardiovascular testing.  01/18/2020 - ablation with cardiology.  12/14/2019 - Cardiology for PVC - We will therefore proceed with catheter ablation at the next available time. Will update echo prior to procedure. BP elevated today.  12/11/2019 - Cardio for OSA - sleep study showed mild OSA. Still has some palpitations - continue multaq. HTN - bp controlled. Set up for laba CPAP titration.  12/06/2019 - Cardio - PVC - stop Rhythmol and start Multaq 400 mg bid.  11/28/2019 - cardio - Chart reviewed as part of pre-operative protocol coverage. Given past medical history and time since last visit, based on ACC/AHA guidelines, Eleri I Atkins would be at acceptable risk for the planned procedure without further cardiovascular testing.  11/27/2019 - ortho visit for shoulder impingent. Medications: Outpatient Encounter Medications as of 08/06/2020  Medication Sig  . albuterol (VENTOLIN HFA) 108 (90 Base) MCG/ACT inhaler Inhale 2 puffs into the lungs every 4 (four) hours as needed for wheezing  or shortness of breath.  . ALPRAZolam (XANAX) 1 MG tablet Take 1 mg by mouth 4 (four) times daily.   Marland Kitchen amphetamine-dextroamphetamine  (ADDERALL) 30 MG tablet Take 30 mg by mouth 3 (three) times daily.   Marland Kitchen EPINEPHrine 0.3 mg/0.3 mL IJ SOAJ injection Inject 0.3 mLs (0.3 mg total) into the muscle as needed for anaphylaxis.  . Evolocumab (REPATHA SURECLICK) 660 MG/ML SOAJ Inject 1 Dose into the skin every 14 (fourteen) days.  . furosemide (LASIX) 40 MG tablet Take 40 mg by mouth daily.   . hydrochlorothiazide (HYDRODIURIL) 25 MG tablet TAKE 1 TABLET(25 MG) BY MOUTH TWICE DAILY  . HYDROmorphone (DILAUDID) 2 MG tablet Take 0.5 tablets (1 mg total) by mouth every 4 (four) hours as needed for severe pain.  Marland Kitchen levothyroxine (SYNTHROID) 112 MCG tablet Take 1 tablet (112 mcg total) by mouth daily.  . magnesium oxide (MAG-OX) 400 MG tablet Take 1 tablet (400 mg total) by mouth daily.  . pantoprazole (PROTONIX) 40 MG tablet Take 1 tablet (40 mg total) by mouth daily.  . potassium chloride SA (KLOR-CON) 20 MEQ tablet Take 1 tablet (20 mEq total) by mouth daily.  . quinapril (ACCUPRIL) 20 MG tablet Take 1 tablet (20 mg total) by mouth 2 (two) times daily.  . Semaglutide,0.25 or 0.5MG/DOS, (OZEMPIC, 0.25 OR 0.5 MG/DOSE,) 2 MG/1.5ML SOPN Inject 0.5 mg into the skin once a week.  . tamsulosin (FLOMAX) 0.4 MG CAPS capsule Take 1 capsule (0.4 mg total) by mouth daily after supper.  Marland Kitchen tiZANidine (ZANAFLEX) 4 MG tablet Take 4 mg by mouth 3 (three) times daily as needed.   . triamcinolone cream (KENALOG) 0.1 % Apply 1 application topically 2 (two) times daily.  . valACYclovir (VALTREX) 1000 MG tablet Take 1,000 mg by mouth 2 (two) times daily as needed.   . zolpidem (AMBIEN) 10 MG tablet Take 10 mg by mouth at bedtime.   No facility-administered encounter medications on file as of 08/06/2020.    Allergies  Allergen Reactions  . Meperidine Hives  . Shellfish Allergy Shortness Of Breath and Swelling  . Diltiazem Swelling  . Acebutolol Swelling  . Amlodipine     Swelling   . Codeine Hives and Nausea And Vomiting  . Cymbalta [Duloxetine Hcl]  Other (See Comments)    Made pt feel crazy  . Dexilant [Dexlansoprazole] Other (See Comments)    Abdominal pain  . Flecainide Swelling  . Gabapentin Other (See Comments)    Makes her feel crazy   . Hydrocodone Other (See Comments)    Keeps patient awake.  . Losartan     Swelling   . Metoprolol Swelling  . Mexiletine     Swelling - hands, legs, face  . Mirtazapine Swelling  . Multaq [Dronedarone] Swelling  . Omeprazole     Abdominal pain  . Oxycodone Itching  . Sectral [Acebutolol Hcl] Swelling  . Toradol [Ketorolac Tromethamine] Itching  . Tramadol     Unable to sleep, makes her itch     SDOH Screenings   Alcohol Screen:   . Last Alcohol Screening Score (AUDIT): Not on file  Depression (PHQ2-9):   . PHQ-2 Score: Not on file  Financial Resource Strain:   . Difficulty of Paying Living Expenses: Not on file  Food Insecurity: Food Insecurity Present  . Worried About Charity fundraiser in the Last Year: Often true  . Ran Out of Food in the Last Year: Often true  Housing:   . Last Housing Risk Score:  Not on file  Physical Activity:   . Days of Exercise per Week: Not on file  . Minutes of Exercise per Session: Not on file  Social Connections:   . Frequency of Communication with Friends and Family: Not on file  . Frequency of Social Gatherings with Friends and Family: Not on file  . Attends Religious Services: Not on file  . Active Member of Clubs or Organizations: Not on file  . Attends Archivist Meetings: Not on file  . Marital Status: Not on file  Stress:   . Feeling of Stress : Not on file  Tobacco Use: Medium Risk  . Smoking Tobacco Use: Former Smoker  . Smokeless Tobacco Use: Never Used  Transportation Needs:   . Film/video editor (Medical): Not on file  . Lack of Transportation (Non-Medical): Not on file     Current Diagnosis/Assessment:  Goals Addressed   None    Asthma    Eosinophil count:   Lab Results  Component Value  Date/Time   EOSPCT 3 09/02/2019 02:42 AM   EOSPCT 1.3 06/13/2014 01:52 AM  %                               Eos (Absolute):  Lab Results  Component Value Date/Time   EOSABS 0.2 05/19/2020 02:54 PM   EOSABS 0.1 06/13/2014 01:52 AM    Tobacco Status:  Social History   Tobacco Use  Smoking Status Former Smoker  . Quit date: 04/08/1995  . Years since quitting: 25.3  Smokeless Tobacco Never Used    Patient has failed these meds in past: cetirizine, flonase, montelukast Patient is currently uncontrolled on the following medications:   Albuterol inhaler 2 puffs q4h prn wheezing/sob Using maintenance inhaler regularly? No Frequency of rescue inhaler use:  infrequently  We discussed:  proper inhaler technique. Gets wheezing/shortness of breath with exercise. Inhaler makes her feel jittery which is undesirable. Discussed Xopenex inhaler as an option if covered by insurance. Xopenex inhaler is not listed as a covered product on insurnace. Xopenex is available as a tier 4 in nebulizer solution only. Will discuss options and cost associated with patient before consulting Dr. Henrene Pastor.   Plan  Continue current medicaitons.     Hyperlipidemia   LDL goal < 100  Lipid Panel     Component Value Date/Time   CHOL 176 05/19/2020 1454   TRIG 315 (H) 05/19/2020 1454   HDL 53 05/19/2020 1454   LDLCALC 73 05/19/2020 1454   LDLCALC 196 (H) 06/20/2019 0908   LDLDIRECT 222 (H) 04/26/2019 1338    Hepatic Function Latest Ref Rng & Units 05/19/2020 04/15/2020 11/07/2019  Total Protein 6.0 - 8.5 g/dL 7.1 7.2 7.5  Albumin 3.8 - 4.9 g/dL 4.4 4.7 4.2  AST 0 - 40 IU/L 19 19 20   ALT 0 - 32 IU/L 14 18 21   Alk Phosphatase 48 - 121 IU/L 73 72 66  Total Bilirubin 0.0 - 1.2 mg/dL 0.2 0.3 0.7  Bilirubin, Direct 0.0 - 0.3 mg/dL - - -     The 10-year ASCVD risk score Mikey Bussing DC Jr., et al., 2013) is: 3%   Values used to calculate the score:     Age: 75 years     Sex: Female     Is Non-Hispanic African  American: No     Diabetic: No     Tobacco smoker: No     Systolic Blood Pressure: 024  mmHg     Is BP treated: Yes     HDL Cholesterol: 53 mg/dL     Total Cholesterol: 176 mg/dL   Patient has failed these meds in past: atorvastatin, cholestyramine, colesvelam, rosuvastatin Patient is currently uncontrolled on the following medications:  . Repatha 140 mg/ml into the skin every 14 days   We discussed:  diet and exercise extensively. Has a [employer] grant to make Repatha more affordable.   Plan  Continue current medications and control with diet and exercise  Hypothyroidism   Lab Results  Component Value Date/Time   TSH 0.697 06/30/2020 03:21 PM   TSH 1.060 05/19/2020 02:54 PM   FREET4 1.37 12/16/2015 04:32 PM   FREET4 1.12 11/24/2015 08:26 AM    Patient has failed these meds in past: n/a Patient is currently controlled on the following medications:  . Levothyroxine 125 mcg daily before breakfast  We discussed:  Patient indicates good adherence and good control of symptoms.   Plan  Continue current medications   Hypertension   Office blood pressures are  BP Readings from Last 3 Encounters:  07/31/20 114/68  07/30/20 (!) 151/88  07/22/20 140/70   Kidney Function Lab Results  Component Value Date/Time   CREATININE 1.0 07/24/2020 12:00 AM   CREATININE 0.88 05/19/2020 02:54 PM   CREATININE 0.76 05/14/2020 11:20 AM   CREATININE 0.88 06/20/2019 09:08 AM   CREATININE 0.86 07/10/2018 04:03 PM   GFRNONAA 75 05/19/2020 02:54 PM   GFRNONAA 75 06/20/2019 09:08 AM   GFRAA 86 05/19/2020 02:54 PM   GFRAA 87 06/20/2019 09:08 AM   K 4.2 05/19/2020 02:54 PM   K 4.4 05/14/2020 11:20 AM   K 3.8 06/13/2014 01:52 AM   K 2.0 (LL) 06/12/2014 01:45 PM    Patient has failed these meds in the past: acebutolol, amlodipine, clonidine, diltiazem, furosemide, hydralazine, irbesartan, metoprolol succinate, propanolol, torsemide, valsartan, spironolactone,  Patient is currently  uncontrolled on the following medications:   Furosemide 40 mg daily  Hydrochlorothiazide 25 mg daily  Quinapril 20 mg twice daily   Potassium chloride 20 meq daily   We discussed diet and exercise extensively. Patient has tried many different diuretics due to history of hypokalemia. She didn't respond well to potassium sparing diuretics and they were ineffective.   Normal diet consists of: scrambled egg, cucumber, tomato, cherries for breakfast. May eat an apple for lunch. Dinner may be chicken with vegetables or a salad. She enjoys salmon. Has a garden and cans vegetables. She eats a lot of salads. May include some dried cherries or cranberries occasionally. Most of the time places fresh berries on it. She enjoys cantaloupe. Stays away from pineapple.  Sunday is splurge day with a pot luck kind of meal. She eats more carbs and beef or pork on those days. Denies snacking. Avoids processed or canned foods to keep sodium out. Her food stamps are being reduced going forward which will make it more difficult to obtain healthy food. She indicates that the food pantry loads you up with unhealthy foods. During the summer she has options for fresh vegetables and healthier food.  Update 05/30/2020 - Pharmacist attempted to find a healthy option for food support since patient's food assistance will be reduced going forward. Meals on Wheels is for patients  Age 62 or above through the [employer]. [employer] is an option that the patient is already familiar with. Will continue to look for healthy food options for patient.    Plan  Continue current medications  and control with diet and exercise   Insomnia   Patient has failed these meds in past: melatonin Patient is currently uncontrolled on the following medications:  Marland Kitchen Zolpidem 10 mg qhs  We discussed:  Only gets 3 hours of sleep a night with Ambien. She has terrible dreams with melatonin and unable to tolerate. Patient does not sleep well which she  attributes it to her pain. She was sleeping last night but pain wakes her up. Discussed trazodone as an option but unable to tolerate in the past from psychiatrist.   Plan  Continue current medications  GERD   Patient has failed these meds in past: Dexilant, famotidine, omeprazole, ranitidine, sucralfate, nexium Patient is currently controlled on the following medications:  . Pantoprazole 40 mg three times weekly  We discussed:   Has had a GI bleed in the past. Has a hiatal hernia. The other proton pump inhibitors did not help her and made her have stomach pain/feeling of emptiness. She began taking it 3 times weekly and symptoms are mostly controlled.   Plan  Continue current medications    Obesity/Pre-Diabetes   BMP Latest Ref Rng & Units 07/24/2020 05/19/2020 05/14/2020  Glucose 65 - 99 mg/dL - 89 108(H)  BUN 4 - 21 23(A) 25(H) 17  Creatinine 0.5 - 1.1 1.0 0.88 0.76  BUN/Creat Ratio 9 - 23 - 28(H) 22  Sodium 134 - 144 mmol/L - 142 140  Potassium 3.5 - 5.2 mmol/L - 4.2 4.4  Chloride 96 - 106 mmol/L - 101 101  CO2 20 - 29 mmol/L - 26 25  Calcium 8.7 - 10.2 mg/dL - 9.8 10.1    Patient has failed these meds in past: Saxenda, metformin Patient is currently uncontrolled on the following medications:  . Ozempic 0.25 mg weekly   We discussed:  Patient hopes to get [facility] approved to manage her weight loss and reduce blood sugar. She thinks her samples of Kirke Shaggy has already helped her blood sugar based on 05/19/2020 lab work results. She is waiting on approval to get [name]. Pharmacist following up with provider to determine status of approval.  Update 05/30/2020 - Company provided voucher is not available for patient since she has Medicare. Pharmacist left a message with patient and reached out to drug rep for additional options available. Will continue to work with patient on obtainable options.   Plan  Begin [phone] once accessible.    Anxiety/Depression managed by Dr Toy Care    Patient has failed these meds in past: bupropion, duloxetine, hydroxyzine, mirtazapine, trazodone Patient is currently controlled on the following medications:  . Alprazolam 1 mg qid  We discussed:  Takes alprazolam for panic attacks. She takes it four times daily. She has not tolerated medications that modulate serotonin in the past. She follows-up with psychiatrist every 6 months typically unless needed. She has been unable to tolerate anti-depressants in the past. She does have bouts with depression or down days but she has no issue with suicidal. She recognizes that some people have things much harder. She can call Dr. Toy Care and get in immediately if needs.   Plan  Continue current medications  Health Maintenance   Patient is currently controlled on the following medications:  . Valtrex 1000 mg twice daily prn - occasional cold sore . Epipen pr anaphylaxis - allergy to shellfish . Adderall 30 mg tid - attention deficit hyperactivity disorder . Magnesium 400 mg daily - supplementation . Premarin vaginal cream vaginally daily - vaginal atrophy . Tizanidine 4  mg tid prn - pain . Diclofenac 1% cream 2-4 grams bid prn knees  We discussed: Since having COVID patient is having headaches every day. Tizanidine does help with pain level some. She takes it 1 time daily if she can but does take it 3 times daily if needed for pain. Patient has torn rotator cuff and knee pain. She sees an orthopedic doctor currently. Patient has taken Adderall for years for ADHD. Dr. Chucky May manages ADHD.   Patient needs an updated Epipen. Pharmacist coordinating refill with provider.   Plan  Continue current medications  Vaccines   Reviewed and discussed patient's vaccination history.    Immunization History  Administered Date(s) Administered  . Influenza Inj Mdck Quad Pf 07/22/2020  . Influenza Split 08/15/2013  . Influenza,inj,Quad PF,6+ Mos 12/04/2014, 08/29/2015, 07/26/2016, 08/11/2017,  08/06/2019  . Influenza-Unspecified 12/05/2015  . PFIZER SARS-COV-2 Vaccination 01/24/2020, 02/19/2020  . Pneumococcal Polysaccharide-23 12/18/2015  . Tdap 08/24/2013, 05/25/2016    Plan  Recommended patient receive annual flu vaccine in office.   Medication Management   Pt uses [facility] for all medications Uses pill box? Yes Pt endorses good compliance  We discussed: Writes down on calendar when Repatha is due to help stay on track. She uses a pill box to keep her medications better organized. If she has an early doctor's appointment sometimes she forgets her medications and will take it a littler later. She notices bp increase and swelling if she misses doses of medication which helps keep it on track.   Plan  Continue current medication management strategy    Follow up: 6 month phone visit

## 2020-08-07 NOTE — Interval H&P Note (Signed)
  UROLOGY H&P UPDATE  Agree with prior H&P dated 9/29.  Cardiac: RRR Lungs: CTA bilaterally  Laterality: left Procedure: shockwave lithotripsy  Urine: UA 9/21 benign  Informed consent obtained, we specifically discussed the risks of bleeding, infection, post-operative pain, need for additional procedures, steinstrasse/obstructive fragments, and persistent pain secondary to other etiology besides non-obstructive stone.  Nickolas Madrid, MD

## 2020-08-13 ENCOUNTER — Other Ambulatory Visit: Payer: Self-pay

## 2020-08-13 ENCOUNTER — Ambulatory Visit: Payer: Medicare Other

## 2020-08-13 DIAGNOSIS — N200 Calculus of kidney: Secondary | ICD-10-CM

## 2020-08-13 DIAGNOSIS — E782 Mixed hyperlipidemia: Secondary | ICD-10-CM

## 2020-08-13 DIAGNOSIS — I10 Essential (primary) hypertension: Secondary | ICD-10-CM

## 2020-08-13 NOTE — Patient Instructions (Signed)
Visit Information  Goals Addressed            This [place] (see longitudinal plan of care for additional care plan information)  Current Barriers:   Chronic Disease Management support, education, and care coordination needs related to Hypertension and Hyperlipidemia   Hypertension BP Readings from Last 3 Encounters:  05/19/20 140/72  04/15/20 120/70  03/11/20 (!) 156/93    Pharmacist Clinical Goal(s): o Over the next 90 days, patient will work with PharmD and providers to achieve BP goal <130/80  Current regimen:  o Furosemide 40 mg daily o Hydrochlorothiazide 25 mg daily o Quinapril 20 mg twice daily o Potassium 20 meq daily  Interventions: o Recommended walking as tolerated for exercise. Increase exercise as tolerated to achieve goal of >150 minutes of moderate exercise each week.  o Reviewed weight loss and increased activity.   Patient self care activities - Over the next 90 days, patient will: o Check BP weekly, document, and provide at future appointments o Ensure daily salt intake < 2300 mg/day  Hyperlipidemia Lab Results  Component Value Date/Time   LDLCALC 73 05/19/2020 02:54 PM   LDLCALC 196 (H) 06/20/2019 09:08 AM   LDLDIRECT 222 (H) 04/26/2019 01:38 PM    Pharmacist Clinical Goal(s): o Over the next 90 days, patient will work with PharmD and providers to achieve LDL goal < 70   Current regimen:  o Repatha140 mg/ml into the skin every 14 days.  Interventions: o Discussed [employer] grant and how to manage with pharmacy to avoid being overcharged at refill. Patient will call in refill and request to be billed to [place].  o Discussed some of the healthy diet options are going to be limited with updated financial supplementation for food. Pharmacist has continued to research options. Meals on wheels is not an option due to age <53.  o Celebrated patient's weight loss and encouraged  continuing good habits.   Patient self care activities - Over the next 90 days, patient will: o Continue healthy diet with available options.  o Continue taking Repatha every 2 weeks.  o Continue staying active.   Pre-Diabetes Lab Results  Component Value Date/Time   HGBA1C 6.0 (H) 04/15/2020 01:41 PM   HGBA1C 6.0 (H) 06/20/2019 09:08 AM    Pharmacist Clinical Goal(s): o Over the next 90 days, patient will work with PharmD and providers to maintain A1c goal <6.5%  Current regimen:  o Ozempic 0.25 mg weekly  Interventions: o Working with provider to obtain [facility] for patient. Patient is ineligible for voucher due to [employer]. Will continue to look at options.  o Keep up the good work with exercise.  o Discussed fatigue and current Ozempic dose (0.25 mg).   Patient self care activities - Over the next 90 days, patient will: o Continue Ozempic 0.25 mg weekly and re-evaluate dose with pharmacist in 2 weeks.  o Continue to work on [employer] and staying active.   Medication management  Pharmacist Clinical Goal(s): o Over the next 90 days, patient will work with PharmD and providers to achieve optimal medication adherence  Current pharmacy[facility]  Interventions o Comprehensive medication review performed. o Continue current medication management strategy  Patient self care activities - Over the next 90 days, patient will: o Focus on medication adherence by continuing to use a pill box.  o Take medications as prescribed o Report  any questions or concerns to PharmD and/or provider(s)  Please see past updates related to this goal by clicking on the "Past Updates" button in the selected goal         The patient verbalized understanding of instructions provided today and declined a print copy of patient instruction materials.   Telephone follow up appointment with pharmacy team member scheduled for: 09/01/2020 @ 1:30 pm   Sherre Poot,  PharmD, [facility] Clinical Pharmacist [facility] [phone] (office) [phone] (mobile)

## 2020-08-13 NOTE — Chronic Care Management (AMB) (Signed)
Chronic Care Management Pharmacy  Name: Laura [name]  MRN: [id number] DOB: 27-Aug-1966  Chief Complaint/ HPI  Laura [name],  54 y.o. , female presents for their Follow-Up CCM visit with the clinical pharmacist via telephone due to COVID-19 Pandemic.  PCP : Lillard Anes, MD  Their chronic conditions include: HTN, Asthma, Sleep apnea, GERD, Hypothyroidism, Osteoarthrosis, CKD, Fibromyalgia, Chronic pain syndrome, Vitamin D deficiency, Mixed hyperlipidemia,  Vitamin b12 deficiency, anxiety, depression, prediabetes, insomnia.   Office Visits: 07/22/2020 - fluconazole for vaginitis. Ozempic for weight loss.  06/30/2020 - ordered stat CT abdomen and pelvis for renal stones. Dilaudid for pain.  05/19/2020 - CBC normal, triglycerides high- watch diet, TSH 1.06 normal, BUN up, may be dehydrated, kidney and liver tests normal. Wegovy prescribed for weight loss.  04/15/2020 - CBC normal, glucose 108, kidney tests normal, liver tests normal Cholesterol LdL high  and triglycerides high- need low cholesterol diet, A1c 6.0, urinalysis normal. Premarin for vaginal atrophy. Augmentin and fluconazole given for sinus infection. Valtrex prescribed.  02/19/2020 - Covid Vaccine.  01/24/2020 - Covid Vaccine Consult Visit: 08/04/2020 - Nephrology - start clonidine 0.1 mg bid.  07/30/2020 - Urology - lithotripsy for stone scheduled 09/30. Marland Kitchen  05/15/2020 - Cardio - She will adjust her quinapril when she is comfortable with a stable blood pressure following the procedure. 05/08/2020 - Ablation for PVCs.  04/08/2020 - Lipids are up, mostly dietary. She is committed to reducing them. Continue repatha - will repeat lipids in 6 months. Plans for ablation for PVC's with Dr. Noralee Stain in [place].  03/11/2020 - Sleep Medicine - Trial of BiPAP therapy on 11/7 cm H2O with a Small size Resmed Full Face Mask AirFit F10 for Her mask and heated humidification. 03/11/2020 - Cardiology - With hx of COVID wonder  whether any of her worsening symptoms are LONGHAUL-- and have suggested she reach out to [facility] whom I have been told is doing a LONGHAUL study 02/20/2020 - encouraged weight loss. Continue same medications for hypertension. Cut back on zolpidem and continue CPAP. 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  She has not tolerated multiple different medications.  We will try her on mexiletine. Reviewed side effects of mexiletine. Blood pressure is elevated.  We will add hydrochlorothiazide.  She is on furosemide; will check her potassium. 02/04/2020 - Knee arthroscopy.  01/23/2020 - Chart reviewed as part of pre-operative protocol coverage. Given past medical history and time since last visit, based on ACC/AHA guidelines, Arnie I Atkins would be at acceptable risk for the planned procedure without further cardiovascular testing.  01/18/2020 - ablation with cardiology.  12/14/2019 - Cardiology for PVC - We will therefore proceed with catheter ablation at the next available time. Will update echo prior to procedure. BP elevated today.  12/11/2019 - Cardio for OSA - sleep study showed mild OSA. Still has some palpitations - continue multaq. HTN - bp controlled. Set up for laba CPAP titration.  12/06/2019 - Cardio - PVC - stop Rhythmol and start Multaq 400 mg bid.  11/28/2019 - cardio - Chart reviewed as part of pre-operative protocol coverage. Given past medical history and time since last visit, based on ACC/AHA guidelines, Addylin I Atkins would be at acceptable risk for the planned procedure without further cardiovascular testing.  11/27/2019 - ortho visit for shoulder impingent. Medications: Outpatient Encounter Medications as of 08/13/2020  Medication Sig  . albuterol (VENTOLIN HFA) 108 (90 Base) MCG/ACT inhaler Inhale 2 puffs into the lungs every 4 (four)  hours as needed for wheezing or shortness of breath.  . ALPRAZolam (XANAX) 1 MG tablet Take 1 mg by mouth 4 (four) times daily.   Marland Kitchen  amphetamine-dextroamphetamine (ADDERALL) 30 MG tablet Take 30 mg by mouth 3 (three) times daily.   Marland Kitchen EPINEPHrine 0.3 mg/0.3 mL IJ SOAJ injection Inject 0.3 mLs (0.3 mg total) into the muscle as needed for anaphylaxis.  . Evolocumab (REPATHA SURECLICK) 725 MG/ML SOAJ Inject 1 Dose into the skin every 14 (fourteen) days.  . furosemide (LASIX) 40 MG tablet Take 40 mg by mouth daily.   . hydrochlorothiazide (HYDRODIURIL) 25 MG tablet TAKE 1 TABLET(25 MG) BY MOUTH TWICE DAILY  . HYDROmorphone (DILAUDID) 2 MG tablet Take 0.5 tablets (1 mg total) by mouth every 4 (four) hours as needed for severe pain.  Marland Kitchen levothyroxine (SYNTHROID) 112 MCG tablet Take 1 tablet (112 mcg total) by mouth daily.  . magnesium oxide (MAG-OX) 400 MG tablet Take 1 tablet (400 mg total) by mouth daily.  . pantoprazole (PROTONIX) 40 MG tablet Take 1 tablet (40 mg total) by mouth daily.  . potassium chloride SA (KLOR-CON) 20 MEQ tablet Take 1 tablet (20 mEq total) by mouth daily.  . quinapril (ACCUPRIL) 20 MG tablet Take 1 tablet (20 mg total) by mouth 2 (two) times daily.  . Semaglutide,0.25 or 0.5MG/DOS, (OZEMPIC, 0.25 OR 0.5 MG/DOSE,) 2 MG/1.5ML SOPN Inject 0.5 mg into the skin once a week.  . tamsulosin (FLOMAX) 0.4 MG CAPS capsule Take 1 capsule (0.4 mg total) by mouth daily after supper.  Marland Kitchen tiZANidine (ZANAFLEX) 4 MG tablet Take 4 mg by mouth 3 (three) times daily as needed.   . triamcinolone cream (KENALOG) 0.1 % Apply 1 application topically 2 (two) times daily.  . valACYclovir (VALTREX) 1000 MG tablet Take 1,000 mg by mouth 2 (two) times daily as needed.   . zolpidem (AMBIEN) 10 MG tablet Take 10 mg by mouth at bedtime.   No facility-administered encounter medications on file as of 08/13/2020.    Allergies  Allergen Reactions  . Meperidine Hives  . Shellfish Allergy Shortness Of Breath and Swelling  . Diltiazem Swelling  . Acebutolol Swelling  . Amlodipine     Swelling   . Codeine Hives and Nausea And Vomiting    . Cymbalta [Duloxetine Hcl] Other (See Comments)    Made pt feel crazy  . Dexilant [Dexlansoprazole] Other (See Comments)    Abdominal pain  . Flecainide Swelling  . Gabapentin Other (See Comments)    Makes her feel crazy   . Hydrocodone Other (See Comments)    Keeps patient awake.  . Losartan     Swelling   . Metoprolol Swelling  . Mexiletine     Swelling - hands, legs, face  . Mirtazapine Swelling  . Multaq [Dronedarone] Swelling  . Omeprazole     Abdominal pain  . Oxycodone Itching  . Sectral [Acebutolol Hcl] Swelling  . Toradol [Ketorolac Tromethamine] Itching  . Tramadol     Unable to sleep, makes her itch     SDOH Screenings   Alcohol Screen:   . Last Alcohol Screening Score (AUDIT): Not on file  Depression (PHQ2-9):   . PHQ-2 Score: Not on file  Financial Resource Strain:   . Difficulty of Paying Living Expenses: Not on file  Food Insecurity: Food Insecurity Present  . Worried About Charity fundraiser in the Last Year: Often true  . Ran Out of Food in the Last Year: Often true  Housing:   .  Last Housing Risk Score: Not on file  Physical Activity:   . Days of Exercise per Week: Not on file  . Minutes of Exercise per Session: Not on file  Social Connections:   . Frequency of Communication with Friends and Family: Not on file  . Frequency of Social Gatherings with Friends and Family: Not on file  . Attends Religious Services: Not on file  . Active Member of Clubs or Organizations: Not on file  . Attends Archivist Meetings: Not on file  . Marital Status: Not on file  Stress:   . Feeling of Stress : Not on file  Tobacco Use: Medium Risk  . Smoking Tobacco Use: Former Smoker  . Smokeless Tobacco Use: Never Used  Transportation Needs:   . Film/video editor (Medical): Not on file  . Lack of Transportation (Non-Medical): Not on file   Current Diagnosis/Assessment:  Goals Addressed            This Visit's Progress   . Pharmacy Care  Plan       CARE PLAN ENTRY (see longitudinal plan of care for additional care plan information)  Current Barriers:  . Chronic Disease Management support, education, and care coordination needs related to Hypertension and Hyperlipidemia   Hypertension BP Readings from Last 3 Encounters:  05/19/20 140/72  04/15/20 120/70  03/11/20 (!) 156/93   . Pharmacist Clinical Goal(s): o Over the next 90 days, patient will work with PharmD and providers to achieve BP goal <130/80 . Current regimen:  o Furosemide 40 mg daily o Hydrochlorothiazide 25 mg daily o Quinapril 20 mg twice daily o Potassium 20 meq daily . Interventions: o Recommended walking as tolerated for exercise. Increase exercise as tolerated to achieve goal of >150 minutes of moderate exercise each week.  o Reviewed weight loss and increased activity.  . Patient self care activities - Over the next 90 days, patient will: o Check BP weekly, document, and provide at future appointments o Ensure daily salt intake < 2300 mg/day  Hyperlipidemia Lab Results  Component Value Date/Time   LDLCALC 73 05/19/2020 02:54 PM   LDLCALC 196 (H) 06/20/2019 09:08 AM   LDLDIRECT 222 (H) 04/26/2019 01:38 PM   . Pharmacist Clinical Goal(s): o Over the next 90 days, patient will work with PharmD and providers to achieve LDL goal < 70  . Current regimen:  o Repatha140 mg/ml into the skin every 14 days. . Interventions: o Discussed [employer] grant and how to manage with pharmacy to avoid being overcharged at refill. Patient will call in refill and request to be billed to [facility].  o Discussed some of the healthy diet options are going to be limited with updated financial supplementation for food. Pharmacist has continued to research options. Meals on wheels is not an option due to age <66.  o Celebrated patient's weight loss and encouraged continuing good habits.  . Patient self care activities - Over the next 90 days, patient  will: o Continue healthy diet with available options.  o Continue taking Repatha every 2 weeks.  o Continue staying active.   Pre-Diabetes Lab Results  Component Value Date/Time   HGBA1C 6.0 (H) 04/15/2020 01:41 PM   HGBA1C 6.0 (H) 06/20/2019 09:08 AM   . Pharmacist Clinical Goal(s): o Over the next 90 days, patient will work with PharmD and providers to maintain A1c goal <6.5% . Current regimen:  o Ozempic 0.25 mg weekly . Interventions: o Working with provider to obtain [facility] for patient.  Patient is ineligible for voucher due to [employer]. Will continue to look at options.  o Keep up the good work with exercise.  o Discussed fatigue and current Ozempic dose (0.25 mg).  . Patient self care activities - Over the next 90 days, patient will: o Continue Ozempic 0.25 mg weekly and re-evaluate dose with pharmacist in 2 weeks.  o Continue to work on [employer] and staying active.   Medication management . Pharmacist Clinical Goal(s): o Over the next 90 days, patient will work with PharmD and providers to achieve optimal medication adherence . Current pharmacy: [facility] . Interventions o Comprehensive medication review performed. o Continue current medication management strategy . Patient self care activities - Over the next 90 days, patient will: o Focus on medication adherence by continuing to use a pill box.  o Take medications as prescribed o Report any questions or concerns to PharmD and/or provider(s)  Please see past updates related to this goal by clicking on the "Past Updates" button in the selected goal         Hyperlipidemia   LDL goal < 100  Lipid Panel     Component Value Date/Time   CHOL 176 05/19/2020 1454   TRIG 315 (H) 05/19/2020 1454   HDL 53 05/19/2020 1454   LDLCALC 73 05/19/2020 1454   LDLCALC 196 (H) 06/20/2019 0908   LDLDIRECT 222 (H) 04/26/2019 1338    Hepatic Function Latest Ref Rng & Units 05/19/2020 04/15/2020  11/07/2019  Total Protein 6.0 - 8.5 g/dL 7.1 7.2 7.5  Albumin 3.8 - 4.9 g/dL 4.4 4.7 4.2  AST 0 - 40 IU/L _0 ALT 0 - 32 IU/L _1 Alk Phosphatase 48 - 121 IU/L 73 72 66  Total Bilirubin 0.0 - 1.2 mg/dL 0.2 0.3 0.7  Bilirubin, Direct 0.0 - 0.3 mg/dL - - -     The 10-year ASCVD risk score Mikey Bussing DC Jr., et al., 2013) is: 3%   Values used to calculate the score:     Age: 89 years     Sex: Female     Is Non-Hispanic African American: No     Diabetic: No     Tobacco smoker: No     Systolic Blood Pressure: 062 mmHg     Is BP treated: Yes     HDL Cholesterol: 53 mg/dL     Total Cholesterol: 176 mg/dL   Patient has failed these meds in past: atorvastatin, cholestyramine, colesvelam, rosuvastatin Patient is currently uncontrolled on the following medications:  . Repatha 140 mg/ml into the skin every 14 days   We discussed:  diet and exercise extensively. Has a [employer] grant to make Repatha more affordable.   Update 08/13/2020 - Patient has overhauled diet and increased exercise. Has lost 10 pounds and continuing to work on weight loss.   Plan  Continue current medications and control with diet and exercise  Hypothyroidism   Lab Results  Component Value Date/Time   TSH 0.697 06/30/2020 03:21 PM   TSH 1.060 05/19/2020 02:54 PM   FREET4 1.37 12/16/2015 04:32 PM   FREET4 1.12 11/24/2015 08:26 AM    Patient has failed these meds in past: n/a Patient is currently controlled on the following medications:  . Levothyroxine 112 mcg daily before breakfast  We discussed:  Patient indicates good adherence and good control of symptoms.   Update 08/13/2020: Patient reports good control of symptoms since dose decreased. Sleeping better less odd sensations  in hand. Reports less sweating on current dose of 112 mcg daily.   Plan  Continue current medications   Hypertension   Office blood pressures are  BP Readings from Last 3 Encounters:  07/31/20 114/68  07/30/20 (!)  151/88  07/22/20 140/70   Kidney Function Lab Results  Component Value Date/Time   CREATININE 1.0 07/24/2020 12:00 AM   CREATININE 0.88 05/19/2020 02:54 PM   CREATININE 0.76 05/14/2020 11:20 AM   CREATININE 0.88 06/20/2019 09:08 AM   CREATININE 0.86 07/10/2018 04:03 PM   GFRNONAA 75 05/19/2020 02:54 PM   GFRNONAA 75 06/20/2019 09:08 AM   GFRAA 86 05/19/2020 02:54 PM   GFRAA 87 06/20/2019 09:08 AM   K 4.2 05/19/2020 02:54 PM   K 4.4 05/14/2020 11:20 AM   K 3.8 06/13/2014 01:52 AM   K 2.0 (LL) 06/12/2014 01:45 PM    Patient has failed these meds in the past: acebutolol, amlodipine, clonidine, diltiazem, furosemide, hydralazine, irbesartan, metoprolol succinate, propanolol, torsemide, valsartan, spironolactone,  Patient is currently uncontrolled on the following medications:   Furosemide 40 mg daily  Hydrochlorothiazide 25 mg daily  Quinapril 20 mg twice daily   Clonidine 0.1 mg bid  Potassium chloride 20 meq daily   We discussed diet and exercise extensively. Patient has tried many different diuretics due to history of hypokalemia. She didn't respond well to potassium sparing diuretics and they were ineffective.   Normal diet consists of: scrambled egg, cucumber, tomato, cherries for breakfast. May eat an apple for lunch. Dinner may be chicken with vegetables or a salad. She enjoys salmon. Has a garden and cans vegetables. She eats a lot of salads. May include some dried cherries or cranberries occasionally. Most of the time places fresh berries on it. She enjoys cantaloupe. Stays away from pineapple.  Sunday is splurge day with a pot luck kind of meal. She eats more carbs and beef or pork on those days. Denies snacking. Avoids processed or canned foods to keep sodium out. Her food stamps are being reduced going forward which will make it more difficult to obtain healthy food. She indicates that the food pantry loads you up with unhealthy foods. During the summer she has options  for fresh vegetables and healthier food.  Update 05/30/2020 - Pharmacist attempted to find a healthy option for food support since patient's food assistance will be reduced going forward. Meals on Wheels is for patients  Age 87 or above through the [employer]. [employer] is an option that the patient is already familiar with. Will continue to look for healthy food options for patient.    Update 08/13/2020 - Patient has lost 10 pounds and working to increase activity.  Plan  Continue current medications and control with diet and exercise   Insomnia   Patient has failed these meds in past: melatonin, trazodone Patient is currently uncontrolled on the following medications:  Marland Kitchen Zolpidem 10 mg qhs  We discussed:  Only gets 3 hours of sleep a night with Ambien. She has terrible dreams with melatonin and unable to tolerate. Patient does not sleep well which she attributes it to her pain. She was sleeping last night but pain wakes her up. Discussed trazodone as an option but unable to tolerate in the past from psychiatrist.   Update 08/13/2020 - Patient reports that she still doesn't sleep well. It is some better since thyroid dose is decreased. Pain inhibits her ability to sleep well. Patient has a torn rotator cuff and arthritis knees. She has  scheduled injections for her knees. Hoping less pain will improve her sleep.   Plan  Continue current medications  GERD   Patient has failed these meds in past: Dexilant, famotidine, omeprazole, ranitidine, sucralfate, nexium Patient is currently controlled on the following medications:  . Pantoprazole 40 mg three times weekly  We discussed:   Has had a GI bleed in the past. Has a hiatal hernia. The other proton pump inhibitors did not help her and made her have stomach pain/feeling of emptiness. She began taking it 3 times weekly and symptoms are mostly controlled.   Update 08/13/2020: Patient reports good control at this time.   Plan  Continue  current medications    Obesity/Pre-Diabetes   BMP Latest Ref Rng & Units 07/24/2020 05/19/2020 05/14/2020  Glucose 65 - 99 mg/dL - 89 108(H)  BUN 4 - 21 23(A) 25(H) 17  Creatinine 0.5 - 1.1 1.0 0.88 0.76  BUN/Creat Ratio 9 - 23 - 28(H) 22  Sodium 134 - 144 mmol/L - 142 140  Potassium 3.5 - 5.2 mmol/L - 4.2 4.4  Chloride 96 - 106 mmol/L - 101 101  CO2 20 - 29 mmol/L - 26 25  Calcium 8.7 - 10.2 mg/dL - 9.8 10.1    Patient has failed these meds in past: Saxenda, metformin Patient is currently uncontrolled on the following medications:  . Ozempic 0.25 mg weekly   We discussed:  Patient hopes to get [facility] approved to manage her weight loss and reduce blood sugar. She thinks her samples of Kirke Shaggy has already helped her blood sugar based on 05/19/2020 lab work results. She is waiting on approval to get [facility]. Pharmacist following up with provider to determine status of approval.  Update 05/30/2020 - Company provided voucher is not available for patient since she has Medicare. Pharmacist left a message with patient and reached out to drug rep for additional options available. Will continue to work with patient on obtainable options.  Update 08/13/2020 - Patient is using Ozempic through patient assistance. She started on 0.25 mg and has taken 2 doses. She is noticing some fatigue but not confident the source. We discussed the benefits of weight loss and improved blood sugar control. Patient reports radically improving her diet and working to begin exercising more. She has lost 10 pounds and hopes to continue.   Plan  Begin [phone] once accessible.    Anxiety/Depression managed by Dr Toy Care   Patient has failed these meds in past: bupropion, duloxetine, hydroxyzine, mirtazapine, trazodone Patient is currently controlled on the following medications:  . Alprazolam 1 mg qid  We discussed:  Takes alprazolam for panic attacks. She takes it four times daily. She has not tolerated medications that  modulate serotonin in the past. She follows-up with psychiatrist every 6 months typically unless needed. She has been unable to tolerate anti-depressants in the past. She does have bouts with depression or down days but she has no issue with suicidal. She recognizes that some people have things much harder. She can call Dr. Toy Care and get in immediately if needs.   Plan  Continue current medications  Health Maintenance   Patient is currently controlled on the following medications:  . Valtrex 1000 mg twice daily prn - occasional cold sore . Epipen pr anaphylaxis - allergy to shellfish . Adderall 30 mg tid - attention deficit hyperactivity disorder . Magnesium 400 mg daily - supplementation . Premarin vaginal cream vaginally daily - vaginal atrophy . Tizanidine 4 mg tid prn - pain .  Diclofenac 1% cream 2-4 grams bid prn knees  We discussed: Since having COVID patient is having headaches every day. Tizanidine does help with pain level some. She takes it 1 time daily if she can but does take it 3 times daily if needed for pain. Patient has torn rotator cuff and knee pain. She sees an orthopedic doctor currently. Patient has taken Adderall for years for ADHD. Dr. Chucky May manages ADHD.   Patient needs an updated Epipen. Pharmacist coordinating refill with provider.   Plan  Continue current medications  Vaccines   Reviewed and discussed patient's vaccination history.    Immunization History  Administered Date(s) Administered  . Influenza Inj Mdck Quad Pf 07/22/2020  . Influenza Split 08/15/2013  . Influenza,inj,Quad PF,6+ Mos 12/04/2014, 08/29/2015, 07/26/2016, 08/11/2017, 08/06/2019  . Influenza-Unspecified 12/05/2015  . PFIZER SARS-COV-2 Vaccination 01/24/2020, 02/19/2020  . Pneumococcal Polysaccharide-23 12/18/2015  . Tdap 08/24/2013, 05/25/2016    Plan  Recommended patient receive third COVID vaccine when eligible.   Medication Management   Pt uses [facility]  pharm[facility] for all medications Uses pill box? Yes Pt endorses good compliance  We discussed: Writes down on calendar when Repatha is due to help stay on track. She uses a pill box to keep her medications better organized. If she has an early doctor's appointment sometimes she forgets her medications and will take it a littler later. She notices bp increase and swelling if she misses doses of medication which helps keep it on track.   Plan  Continue current medication management strategy    Follow up: 2 week phone visit

## 2020-08-14 ENCOUNTER — Other Ambulatory Visit: Payer: Self-pay

## 2020-08-14 ENCOUNTER — Ambulatory Visit (INDEPENDENT_AMBULATORY_CARE_PROVIDER_SITE_OTHER): Payer: Medicare Other | Admitting: Physician Assistant

## 2020-08-14 ENCOUNTER — Ambulatory Visit
Admission: RE | Admit: 2020-08-14 | Discharge: 2020-08-14 | Disposition: A | Discharge status: Home / Self Care | Payer: Medicare Other | Source: Ambulatory Visit | Attending: Physician Assistant | Admitting: Physician Assistant

## 2020-08-14 ENCOUNTER — Encounter: Payer: Self-pay | Admitting: Physician Assistant

## 2020-08-14 ENCOUNTER — Ambulatory Visit
Admission: RE | Admit: 2020-08-14 | Discharge: 2020-08-14 | Disposition: A | Discharge status: Home / Self Care | Payer: Medicare Other | Attending: Physician Assistant | Admitting: Physician Assistant

## 2020-08-14 VITALS — BP 125/83 | HR 62 | Ht 64.0 in | Wt 178.0 lb

## 2020-08-14 DIAGNOSIS — N200 Calculus of kidney: Secondary | ICD-10-CM | POA: Insufficient documentation

## 2020-08-14 DIAGNOSIS — I878 Other specified disorders of veins: Secondary | ICD-10-CM | POA: Diagnosis not present

## 2020-08-14 LAB — URINALYSIS, COMPLETE
Bilirubin, UA: NEGATIVE
Glucose, UA: NEGATIVE
Ketones, UA: NEGATIVE
Leukocytes,UA: NEGATIVE
Nitrite, UA: NEGATIVE
Protein,UA: NEGATIVE
RBC, UA: NEGATIVE
Specific Gravity, UA: <1.005 — ABNORMAL LOW (ref 1.005–1.030)
Urobilinogen, Ur: 0.2 mg/dL (ref 0.2–1.0)
pH, UA: 6 (ref 5.0–7.5)

## 2020-08-14 LAB — MICROSCOPIC EXAMINATION
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
## 2020-08-26 DIAGNOSIS — G4733 Obstructive sleep apnea (adult) (pediatric): Secondary | ICD-10-CM | POA: Diagnosis not present

## 2020-09-01 ENCOUNTER — Other Ambulatory Visit: Payer: Self-pay

## 2020-09-01 ENCOUNTER — Ambulatory Visit: Payer: Medicare Other

## 2020-09-01 NOTE — Patient Instructions (Signed)
Visit Information  Goals Addressed            This Visit's Progress   . Pharmacy Care Plan       CARE PLAN ENTRY (see longitudinal plan of care for additional care plan information)  Current Barriers:  . Chronic Disease Management support, education, and care coordination needs related to Hypertension and Hyperlipidemia   Hypertension BP Readings from Last 3 Encounters:  05/19/20 140/72  04/15/20 120/70  03/11/20 (!) 156/93   . Pharmacist Clinical Goal(s): o Over the next 90 days, patient will work with PharmD and providers to achieve BP goal <130/80 . Current regimen:  o Furosemide 40 mg daily o Hydrochlorothiazide 25 mg daily o Quinapril 20 mg twice daily o Potassium 20 meq daily . Interventions: o Recommended walking as tolerated for exercise. Increase exercise as tolerated to achieve goal of >150 minutes of moderate exercise each week.  o Reviewed weight loss and increased activity.  . Patient self care activities - Over the next 90 days, patient will: o Check BP weekly, document, and provide at future appointments o Ensure daily salt intake < 2300 mg/day  Hyperlipidemia Lab Results  Component Value Date/Time   LDLCALC 73 05/19/2020 02:54 PM   LDLCALC 196 (H) 06/20/2019 09:08 AM   LDLDIRECT 222 (H) 04/26/2019 01:38 PM   . Pharmacist Clinical Goal(s): o Over the next 90 days, patient will work with PharmD and providers to achieve LDL goal < 70  . Current regimen:  o Repatha140 mg/ml into the skin every 14 days. . Interventions: o Discussed [employer] grant and how to manage with pharmacy to avoid being overcharged at refill. Patient will call in refill and request to be billed to [place].  o Discussed some of the healthy diet options are going to be limited with updated financial supplementation for food. Pharmacist has continued to research options. Meals on wheels is not an option due to age <65.  o Celebrated patient's weight loss and encouraged  continuing good habits.  . Patient self care activities - Over the next 90 days, patient will: o Continue healthy diet with available options.  o Continue taking Repatha every 2 weeks.  o Continue staying active.   Pre-Diabetes Lab Results  Component Value Date/Time   HGBA1C 6.0 (H) 04/15/2020 01:41 PM   HGBA1C 6.0 (H) 06/20/2019 09:08 AM   . Pharmacist Clinical Goal(s): o Over the next 90 days, patient will work with PharmD and providers to maintain A1c goal <6.5% . Current regimen:  o Ozempic 0.25 mg weekly . Interventions: o Keep up the good work with exercise.  o Discussed fatigue and current Ozempic dose (0.25 mg).  o Patient concerned Ozempic may be causing some pain/inflammation.  . Patient self care activities - Over the next 90 days, patient will: o Continue Ozempic 0.25 mg weekly and re-evaluate dose with pharmacist in 2 weeks.  o Continue to work on [employer] and staying active.   Medication management . Pharmacist Clinical Goal(s): o Over the next 90 days, patient will work with PharmD and providers to achieve optimal medication adherence . Current pharmacy: [facility] . Interventions o Comprehensive medication review performed. o Continue current medication management strategy . Patient self care activities - Over the next 90 days, patient will: o Focus on medication adherence by continuing to use a pill box.  o Take medications as prescribed o Report any questions or concerns to PharmD and/or provider(s)  Please see past updates related to  this goal by clicking on the "Past Updates" button in the selected goal         Patient verbalizes understanding of instructions provided today.   Telephone follow up appointment with pharmacy team member scheduled for: 09/15/2020  Sherre Poot, PharmD, [facility] Clinical Pharmacist [facility] [phone] (office) [phone] (mobile)

## 2020-09-01 NOTE — Chronic Care Management (AMB) (Signed)
Chronic Care Management Pharmacy  Name: Laura [name]  MRN: [id number] DOB: October 28, 1966  Chief Complaint/ HPI  Laura [name],  54 y.o. , female presents for their Follow-Up CCM visit with the clinical pharmacist via telephone due to COVID-19 Pandemic.  PCP : Lillard Anes, MD  Their chronic conditions include: HTN, Asthma, Sleep apnea, GERD, Hypothyroidism, Osteoarthrosis, CKD, Fibromyalgia, Chronic pain syndrome, Vitamin D deficiency, Mixed hyperlipidemia,  Vitamin b12 deficiency, anxiety, depression, prediabetes, insomnia.   Office Visits: 07/22/2020 - fluconazole for vaginitis. Ozempic for weight loss.  06/30/2020 - ordered stat CT abdomen and pelvis for renal stones. Dilaudid for pain.  05/19/2020 - CBC normal, triglycerides high- watch diet, TSH 1.06 normal, BUN up, may be dehydrated, kidney and liver tests normal. Wegovy prescribed for weight loss.  04/15/2020 - CBC normal, glucose 108, kidney tests normal, liver tests normal Cholesterol LdL high  and triglycerides high- need low cholesterol diet, A1c 6.0, urinalysis normal. Premarin for vaginal atrophy. Augmentin and fluconazole given for sinus infection. Valtrex prescribed.  02/19/2020 - Covid Vaccine.  01/24/2020 - Covid Vaccine  Consult Visit: 08/14/2020 -Urology for kidney stone. Successful procedure for stone. Samples of litholyte.  Medications: Outpatient Encounter Medications as of 09/01/2020  Medication Sig  . pantoprazole (PROTONIX) 40 MG tablet Take 1 tablet (40 mg total) by mouth daily. (Patient taking differently: Take 40 mg by mouth daily. Taking three times weekly)  . potassium chloride SA (KLOR-CON) 20 MEQ tablet Take 1 tablet (20 mEq total) by mouth daily.  . quinapril (ACCUPRIL) 20 MG tablet Take 1 tablet (20 mg total) by mouth 2 (two) times daily.  . Semaglutide,0.25 or 0.5MG/DOS, (OZEMPIC, 0.25 OR 0.5 MG/DOSE,) 2 MG/1.5ML SOPN Inject 0.5 mg into the skin once a week. (Patient taking differently: Inject  0.25 mg into the skin once a week. )  . albuterol (VENTOLIN HFA) 108 (90 Base) MCG/ACT inhaler Inhale 2 puffs into the lungs every 4 (four) hours as needed for wheezing or shortness of breath.  . ALPRAZolam (XANAX) 1 MG tablet Take 1 mg by mouth 4 (four) times daily.   Marland Kitchen amphetamine-dextroamphetamine (ADDERALL) 30 MG tablet Take 30 mg by mouth 3 (three) times daily.   . cloNIDine (CATAPRES) 0.1 MG tablet  (Patient not taking: Reported on 09/01/2020)  . EPINEPHrine 0.3 mg/0.3 mL IJ SOAJ injection Inject 0.3 mLs (0.3 mg total) into the muscle as needed for anaphylaxis.  . Evolocumab (REPATHA SURECLICK) 175 MG/ML SOAJ Inject 1 Dose into the skin every 14 (fourteen) days.  . furosemide (LASIX) 40 MG tablet Take 40 mg by mouth daily.   . hydrochlorothiazide (HYDRODIURIL) 25 MG tablet TAKE 1 TABLET(25 MG) BY MOUTH TWICE DAILY  . HYDROmorphone (DILAUDID) 2 MG tablet Take 0.5 tablets (1 mg total) by mouth every 4 (four) hours as needed for severe pain.  Marland Kitchen levothyroxine (SYNTHROID) 112 MCG tablet Take 1 tablet (112 mcg total) by mouth daily.  . magnesium oxide (MAG-OX) 400 MG tablet Take 1 tablet (400 mg total) by mouth daily.  Marland Kitchen mexiletine (MEXITIL) 150 MG capsule   . tamsulosin (FLOMAX) 0.4 MG CAPS capsule Take 1 capsule (0.4 mg total) by mouth daily after supper.  Marland Kitchen tiZANidine (ZANAFLEX) 4 MG tablet Take 4 mg by mouth 3 (three) times daily as needed.   . triamcinolone cream (KENALOG) 0.1 % Apply 1 application topically 2 (two) times daily.  . valACYclovir (VALTREX) 1000 MG tablet Take 1,000 mg by mouth 2 (two) times daily as needed.   . zolpidem (  AMBIEN) 10 MG tablet Take 10 mg by mouth at bedtime.   No facility-administered encounter medications on file as of 09/01/2020.    Allergies  Allergen Reactions  . Meperidine Hives  . Shellfish Allergy Shortness Of Breath and Swelling  . Diltiazem Swelling  . Acebutolol Swelling  . Amlodipine     Swelling   . Codeine Hives and Nausea And Vomiting  .  Cymbalta [Duloxetine Hcl] Other (See Comments)    Made pt feel crazy  . Dexilant [Dexlansoprazole] Other (See Comments)    Abdominal pain  . Flecainide Swelling  . Gabapentin Other (See Comments)    Makes her feel crazy   . Hydrocodone Other (See Comments)    Keeps patient awake.  . Losartan     Swelling   . Metoprolol Swelling  . Mexiletine     Swelling - hands, legs, face  . Mirtazapine Swelling  . Multaq [Dronedarone] Swelling  . Omeprazole     Abdominal pain  . Oxycodone Itching  . Sectral [Acebutolol Hcl] Swelling  . Toradol [Ketorolac Tromethamine] Itching  . Tramadol     Unable to sleep, makes her itch     SDOH Screenings   Alcohol Screen:   . Last Alcohol Screening Score (AUDIT): Not on file  Depression (PHQ2-9):   . PHQ-2 Score: Not on file  Financial Resource Strain:   . Difficulty of Paying Living Expenses: Not on file  Food Insecurity: Food Insecurity Present  . Worried About Charity fundraiser in the Last Year: Often true  . Ran Out of Food in the Last Year: Often true  Housing:   . Last Housing Risk Score: Not on file  Physical Activity:   . Days of Exercise per Week: Not on file  . Minutes of Exercise per Session: Not on file  Social Connections:   . Frequency of Communication with Friends and Family: Not on file  . Frequency of Social Gatherings with Friends and Family: Not on file  . Attends Religious Services: Not on file  . Active Member of Clubs or Organizations: Not on file  . Attends Archivist Meetings: Not on file  . Marital Status: Not on file  Stress:   . Feeling of Stress : Not on file  Tobacco Use: Medium Risk  . Smoking Tobacco Use: Former Smoker  . Smokeless Tobacco Use: Never Used  Transportation Needs:   . Film/video editor (Medical): Not on file  . Lack of Transportation (Non-Medical): Not on file   Current Diagnosis/Assessment:  Goals Addressed            This Visit's Progress   . Pharmacy Care Plan        CARE PLAN ENTRY (see longitudinal plan of care for additional care plan information)  Current Barriers:  . Chronic Disease Management support, education, and care coordination needs related to Hypertension and Hyperlipidemia   Hypertension BP Readings from Last 3 Encounters:  05/19/20 140/72  04/15/20 120/70  03/11/20 (!) 156/93   . Pharmacist Clinical Goal(s): o Over the next 90 days, patient will work with PharmD and providers to achieve BP goal <130/80 . Current regimen:  o Furosemide 40 mg daily o Hydrochlorothiazide 25 mg daily o Quinapril 20 mg twice daily o Potassium 20 meq daily . Interventions: o Recommended walking as tolerated for exercise. Increase exercise as tolerated to achieve goal of >150 minutes of moderate exercise each week.  o Reviewed weight loss and increased activity.  . Patient self  care activities - Over the next 90 days, patient will: o Check BP weekly, document, and provide at future appointments o Ensure daily salt intake < 2300 mg/day  Hyperlipidemia Lab Results  Component Value Date/Time   LDLCALC 73 05/19/2020 02:54 PM   LDLCALC 196 (H) 06/20/2019 09:08 AM   LDLDIRECT 222 (H) 04/26/2019 01:38 PM   . Pharmacist Clinical Goal(s): o Over the next 90 days, patient will work with PharmD and providers to achieve LDL goal < 70  . Current regimen:  o Repatha140 mg/ml into the skin every 14 days. . Interventions: o Discussed [employer] grant and how to manage with pharmacy to avoid being overcharged at refill. Patient will call in refill and request to be billed to [place].  o Discussed some of the healthy diet options are going to be limited with updated financial supplementation for food. Pharmacist has continued to research options. Meals on wheels is not an option due to age <32.  o Celebrated patient's weight loss and encouraged continuing good habits.  . Patient self care activities - Over the next 90 days, patient will: o Continue  healthy diet with available options.  o Continue taking Repatha every 2 weeks.  o Continue staying active.   Pre-Diabetes Lab Results  Component Value Date/Time   HGBA1C 6.0 (H) 04/15/2020 01:41 PM   HGBA1C 6.0 (H) 06/20/2019 09:08 AM   . Pharmacist Clinical Goal(s): o Over the next 90 days, patient will work with PharmD and providers to maintain A1c goal <6.5% . Current regimen:  o Ozempic 0.25 mg weekly . Interventions: o Keep up the good work with exercise.  o Discussed fatigue and current Ozempic dose (0.25 mg).  o Patient concerned Ozempic may be causing some pain/inflammation.  . Patient self care activities - Over the next 90 days, patient will: o Continue Ozempic 0.25 mg weekly and re-evaluate dose with pharmacist in 2 weeks.  o Continue to work on [employer] and staying active.   Medication management . Pharmacist Clinical Goal(s): o Over the next 90 days, patient will work with PharmD and providers to achieve optimal medication adherence . Current pharmacy: [facility] . Interventions o Comprehensive medication review performed. o Continue current medication management strategy . Patient self care activities - Over the next 90 days, patient will: o Focus on medication adherence by continuing to use a pill box.  o Take medications as prescribed o Report any questions or concerns to PharmD and/or provider(s)  Please see past updates related to this goal by clicking on the "Past Updates" button in the selected goal         Hyperlipidemia   LDL goal < 100  Lipid Panel     Component Value Date/Time   CHOL 176 05/19/2020 1454   TRIG 315 (H) 05/19/2020 1454   HDL 53 05/19/2020 1454   LDLCALC 73 05/19/2020 1454   LDLCALC 196 (H) 06/20/2019 0908   LDLDIRECT 222 (H) 04/26/2019 1338    Hepatic Function Latest Ref Rng & Units 05/19/2020 04/15/2020 11/07/2019  Total Protein 6.0 - 8.5 g/dL 7.1 7.2 7.5  Albumin 3.8 - 4.9 g/dL 4.4 4.7 4.2  AST 0 - 40 IU/L  19 19 20   ALT 0 - 32 IU/L 14 18 21   Alk Phosphatase 48 - 121 IU/L 73 72 66  Total Bilirubin 0.0 - 1.2 mg/dL 0.2 0.3 0.7  Bilirubin, Direct 0.0 - 0.3 mg/dL - - -     The 10-year ASCVD risk score (  Renaye Rakers., et al., 2013) is: 2.1%   Values used to calculate the score:     Age: 85 years     Sex: Female     Is Non-Hispanic African American: No     Diabetic: No     Tobacco smoker: No     Systolic Blood Pressure: 660 mmHg     Is BP treated: Yes     HDL Cholesterol: 53 mg/dL     Total Cholesterol: 176 mg/dL   Patient has failed these meds in past: atorvastatin, cholestyramine, colesvelam, rosuvastatin Patient is currently uncontrolled on the following medications:  . Repatha 140 mg/ml into the skin every 14 days   We discussed:  diet and exercise extensively. Patient uses [employer] grant for cost associated. Reports tolerating therapy well.   Plan  Continue current medications and control with diet and exercise   Hypertension   Office blood pressures are  BP Readings from Last 3 Encounters:  08/14/20 125/83  07/31/20 114/68  07/30/20 (!) 151/88   Kidney Function Lab Results  Component Value Date/Time   CREATININE 1.0 07/24/2020 12:00 AM   CREATININE 0.88 05/19/2020 02:54 PM   CREATININE 0.76 05/14/2020 11:20 AM   CREATININE 0.88 06/20/2019 09:08 AM   CREATININE 0.86 07/10/2018 04:03 PM   GFRNONAA 75 05/19/2020 02:54 PM   GFRNONAA 75 06/20/2019 09:08 AM   GFRAA 86 05/19/2020 02:54 PM   GFRAA 87 06/20/2019 09:08 AM   K 4.2 05/19/2020 02:54 PM   K 4.4 05/14/2020 11:20 AM   K 3.8 06/13/2014 01:52 AM   K 2.0 (LL) 06/12/2014 01:45 PM    Patient has failed these meds in the past: acebutolol, amlodipine, clonidine, diltiazem, furosemide, hydralazine, irbesartan, metoprolol succinate, propanolol, torsemide, valsartan, spironolactone, clonidine Patient is currently uncontrolled on the following medications:   Furosemide 40 mg daily  Hydrochlorothiazide 25 mg  daily  Quinapril 20 mg twice daily   Potassium chloride 20 meq daily   We discussed diet and exercise extensively. Patient reports that nephrology stopped clonidine due to her inability to tolerate. She is working with them to help keep her blood pressure at goal.   Plan  Continue current medications and control with diet and exercise    Obesity/Pre-Diabetes   BMP Latest Ref Rng & Units 07/24/2020 05/19/2020 05/14/2020  Glucose 65 - 99 mg/dL - 89 108(H)  BUN 4 - 21 23(A) 25(H) 17  Creatinine 0.5 - 1.1 1.0 0.88 0.76  BUN/Creat Ratio 9 - 23 - 28(H) 22  Sodium 134 - 144 mmol/L - 142 140  Potassium 3.5 - 5.2 mmol/L - 4.2 4.4  Chloride 96 - 106 mmol/L - 101 101  CO2 20 - 29 mmol/L - 26 25  Calcium 8.7 - 10.2 mg/dL - 9.8 10.1    Patient has failed these meds in past: Saxenda, metformin Patient is currently uncontrolled on the following medications:  . Ozempic 0.25 mg weekly   We discussed:  Patient has increased swelling and overall pain since beginning Ozempic. She can't determine if the Ozempic is causing this or something else. She feels that the Ozempic is helping with decreasing appetite. She would like to give it a few more weeks before increasing dose to determine any other sources for inflammation.    Plan  Continue Ozempic 0.25 mg weekly. Reassess tolerance for Ozempic in 2 weeks to determine if causing extra pain/swelling/inflammation in patient.    Vaccines   Reviewed and discussed patient's vaccination history.    Immunization History  Administered Date(s) Administered  . Influenza Inj Mdck Quad Pf 07/22/2020  . Influenza Split 08/15/2013  . Influenza,inj,Quad PF,6+ Mos 12/04/2014, 08/29/2015, 07/26/2016, 08/11/2017, 08/06/2019  . Influenza-Unspecified 12/05/2015  . PFIZER SARS-COV-2 Vaccination 01/24/2020, 02/19/2020  . Pneumococcal Polysaccharide-23 12/18/2015  . Tdap 08/24/2013, 05/25/2016    Plan  Recommended patient receive third COVID vaccine when  eligible.   Medication Management   Pt uses [facility] for all medications Uses pill box? Yes Pt endorses good compliance  We discussed: Writes down on calendar when Repatha is due to help stay on track. She uses a pill box to keep her medications better organized. If she has an early doctor's appointment sometimes she forgets her medications and will take it a littler later. She notices bp increase and swelling if she misses doses of medication which helps keep it on track.   Plan  Continue current medication management strategy    Follow up: 2 week phone visit

## 2020-09-02 DIAGNOSIS — M1711 Unilateral primary osteoarthritis, right knee: Secondary | ICD-10-CM | POA: Diagnosis not present

## 2020-09-09 DIAGNOSIS — M1711 Unilateral primary osteoarthritis, right knee: Secondary | ICD-10-CM | POA: Diagnosis not present

## 2020-09-15 ENCOUNTER — Ambulatory Visit: Payer: Medicare Other

## 2020-09-15 ENCOUNTER — Other Ambulatory Visit: Payer: Self-pay

## 2020-09-15 DIAGNOSIS — E669 Obesity, unspecified: Secondary | ICD-10-CM

## 2020-09-15 DIAGNOSIS — Z6831 Body mass index (BMI) 31.0-31.9, adult: Secondary | ICD-10-CM

## 2020-09-15 DIAGNOSIS — R7303 Prediabetes: Secondary | ICD-10-CM

## 2020-09-15 NOTE — Chronic Care Management (AMB) (Signed)
Chronic Care Management Pharmacy  Name: Laura [name]  MRN: [id number] DOB: [birth year]  Chief Complaint/ HPI  Laura [name],  54 y.o. , female presents for their Follow-Up CCM visit with the clinical pharmacist via telephone due to COVID-19 Pandemic.  PCP : Lillard Anes, MD  Their chronic conditions include: HTN, Asthma, Sleep apnea, GERD, Hypothyroidism, Osteoarthrosis, CKD, Fibromyalgia, Chronic pain syndrome, Vitamin D deficiency, Mixed hyperlipidemia,  Vitamin b12 deficiency, anxiety, depression, prediabetes, insomnia.   Office Visits: 07/22/2020 - fluconazole for vaginitis. Ozempic for weight loss.  06/30/2020 - ordered stat CT abdomen and pelvis for renal stones. Dilaudid for pain.  05/19/2020 - CBC normal, triglycerides high- watch diet, TSH 1.06 normal, BUN up, may be dehydrated, kidney and liver tests normal. Wegovy prescribed for weight loss.  04/15/2020 - CBC normal, glucose 108, kidney tests normal, liver tests normal Cholesterol LdL high  and triglycerides high- need low cholesterol diet, A1c 6.0, urinalysis normal. Premarin for vaginal atrophy. Augmentin and fluconazole given for sinus infection. Valtrex prescribed.  02/19/2020 - Covid Vaccine.  01/24/2020 - Covid Vaccine  Consult Visit: 08/14/2020 -Urology for kidney stone. Successful procedure for stone. Samples of litholyte.  Medications: Outpatient Encounter Medications as of 09/15/2020  Medication Sig  . albuterol (VENTOLIN HFA) 108 (90 Base) MCG/ACT inhaler Inhale 2 puffs into the lungs every 4 (four) hours as needed for wheezing or shortness of breath.  . ALPRAZolam (XANAX) 1 MG tablet Take 1 mg by mouth 4 (four) times daily.   Marland Kitchen amphetamine-dextroamphetamine (ADDERALL) 30 MG tablet Take 30 mg by mouth 3 (three) times daily.   . cloNIDine (CATAPRES) 0.1 MG tablet  (Patient not taking: Reported on 09/01/2020)  . EPINEPHrine 0.3 mg/0.3 mL IJ SOAJ injection Inject 0.3 mLs (0.3 mg total) into the muscle as  needed for anaphylaxis.  . Evolocumab (REPATHA SURECLICK) 578 MG/ML SOAJ Inject 1 Dose into the skin every 14 (fourteen) days.  . furosemide (LASIX) 40 MG tablet Take 40 mg by mouth daily.   . hydrochlorothiazide (HYDRODIURIL) 25 MG tablet TAKE 1 TABLET(25 MG) BY MOUTH TWICE DAILY  . HYDROmorphone (DILAUDID) 2 MG tablet Take 0.5 tablets (1 mg total) by mouth every 4 (four) hours as needed for severe pain.  Marland Kitchen levothyroxine (SYNTHROID) 112 MCG tablet Take 1 tablet (112 mcg total) by mouth daily.  . magnesium oxide (MAG-OX) 400 MG tablet Take 1 tablet (400 mg total) by mouth daily.  Marland Kitchen mexiletine (MEXITIL) 150 MG capsule   . pantoprazole (PROTONIX) 40 MG tablet Take 1 tablet (40 mg total) by mouth daily. (Patient taking differently: Take 40 mg by mouth daily. Taking three times weekly)  . potassium chloride SA (KLOR-CON) 20 MEQ tablet Take 1 tablet (20 mEq total) by mouth daily.  . quinapril (ACCUPRIL) 20 MG tablet Take 1 tablet (20 mg total) by mouth 2 (two) times daily.  . Semaglutide,0.25 or 0.5MG /DOS, (OZEMPIC, 0.25 OR 0.5 MG/DOSE,) 2 MG/1.5ML SOPN Inject 0.5 mg into the skin once a week. (Patient taking differently: Inject 0.25 mg into the skin once a week. )  . tamsulosin (FLOMAX) 0.4 MG CAPS capsule Take 1 capsule (0.4 mg total) by mouth daily after supper.  Marland Kitchen tiZANidine (ZANAFLEX) 4 MG tablet Take 4 mg by mouth 3 (three) times daily as needed.   . triamcinolone cream (KENALOG) 0.1 % Apply 1 application topically 2 (two) times daily.  . valACYclovir (VALTREX) 1000 MG tablet Take 1,000 mg by mouth 2 (two) times daily as needed.   . zolpidem (  AMBIEN) 10 MG tablet Take 10 mg by mouth at bedtime.   No facility-administered encounter medications on file as of 09/15/2020.    Allergies  Allergen Reactions  . Meperidine Hives  . Shellfish Allergy Shortness Of Breath and Swelling  . Diltiazem Swelling  . Acebutolol Swelling  . Amlodipine     Swelling   . Codeine Hives and Nausea And Vomiting   . Cymbalta [Duloxetine Hcl] Other (See Comments)    Made pt feel crazy  . Dexilant [Dexlansoprazole] Other (See Comments)    Abdominal pain  . Flecainide Swelling  . Gabapentin Other (See Comments)    Makes her feel crazy   . Hydrocodone Other (See Comments)    Keeps patient awake.  . Losartan     Swelling   . Metoprolol Swelling  . Mexiletine     Swelling - hands, legs, face  . Mirtazapine Swelling  . Multaq [Dronedarone] Swelling  . Omeprazole     Abdominal pain  . Oxycodone Itching  . Sectral [Acebutolol Hcl] Swelling  . Toradol [Ketorolac Tromethamine] Itching  . Tramadol     Unable to sleep, makes her itch     SDOH Screenings   Alcohol Screen:   . Last Alcohol Screening Score (AUDIT): Not on file  Depression (PHQ2-9):   . PHQ-2 Score: Not on file  Financial Resource Strain:   . Difficulty of Paying Living Expenses: Not on file  Food Insecurity: Food Insecurity Present  . Worried About Charity fundraiser in the Last Year: Often true  . Ran Out of Food in the Last Year: Often true  Housing:   . Last Housing Risk Score: Not on file  Physical Activity:   . Days of Exercise per Week: Not on file  . Minutes of Exercise per Session: Not on file  Social Connections:   . Frequency of Communication with Friends and Family: Not on file  . Frequency of Social Gatherings with Friends and Family: Not on file  . Attends Religious Services: Not on file  . Active Member of Clubs or Organizations: Not on file  . Attends Archivist Meetings: Not on file  . Marital Status: Not on file  Stress:   . Feeling of Stress : Not on file  Tobacco Use: Medium Risk  . Smoking Tobacco Use: Former Smoker  . Smokeless Tobacco Use: Never Used  Transportation Needs:   . Film/video editor (Medical): Not on file  . Lack of Transportation (Non-Medical): Not on file   Current Diagnosis/Assessment:  Goals Addressed            This Visit's Progress   . Pharmacy Care  Plan       CARE PLAN ENTRY (see longitudinal plan of care for additional care plan information)  Current Barriers:  . Chronic Disease Management support, education, and care coordination needs related to Hypertension and Hyperlipidemia   Hypertension BP Readings from Last 3 Encounters:  05/19/20 140/72  04/15/20 120/70  03/11/20 (!) 156/93   . Pharmacist Clinical Goal(s): o Over the next 90 days, patient will work with PharmD and providers to achieve BP goal <130/80 . Current regimen:  o Furosemide 40 mg daily o Hydrochlorothiazide 25 mg daily o Quinapril 20 mg twice daily o Potassium 20 meq daily . Interventions: o Recommended walking as tolerated for exercise. Increase exercise as tolerated to achieve goal of >150 minutes of moderate exercise each week.  o Reviewed weight loss and increased activity.  . Patient self  care activities - Over the next 90 days, patient will: o Check BP weekly, document, and provide at future appointments o Ensure daily salt intake < 2300 mg/day  Hyperlipidemia Lab Results  Component Value Date/Time   LDLCALC 73 05/19/2020 02:54 PM   LDLCALC 196 (H) 06/20/2019 09:08 AM   LDLDIRECT 222 (H) 04/26/2019 01:38 PM   . Pharmacist Clinical Goal(s): o Over the next 90 days, patient will work with PharmD and providers to achieve LDL goal < 70  . Current regimen:  o Repatha140 mg/ml into the skin every 14 days. . Interventions: o Discussed [employer] grant and how to manage with pharmacy to avoid being overcharged at refill. Patient will call in refill and request to be billed to [place].  o Discussed some of the healthy diet options are going to be limited with updated financial supplementation for food. Pharmacist has continued to research options. Meals on wheels is not an option due to age <24.  o Celebrated patient's weight loss and encouraged continuing good habits.  . Patient self care activities - Over the next 90 days, patient  will: o Continue healthy diet with available options.  o Continue taking Repatha every 2 weeks.  o Continue staying active.   Pre-Diabetes Lab Results  Component Value Date/Time   HGBA1C 6.0 (H) 04/15/2020 01:41 PM   HGBA1C 6.0 (H) 06/20/2019 09:08 AM   . Pharmacist Clinical Goal(s): o Over the next 90 days, patient will work with PharmD and providers to maintain A1c goal <6.5% . Current regimen:  o Ozempic 0.25 mg weekly . Interventions: o Keep up the good work with exercise.  o Discussed patient stopped Ozempic due to fatigue, pain and inflammation.  . Patient self care activities - Over the next 90 days, patient will: o Continue holding Ozempic. Will re-evaluate resuming treatment in January.  o Continue to work on [employer] and staying active.   Medication management . Pharmacist Clinical Goal(s): o Over the next 90 days, patient will work with PharmD and providers to achieve optimal medication adherence . Current pharmacy: [facility] . Interventions o Comprehensive medication review performed. o Continue current medication management strategy . Patient self care activities - Over the next 90 days, patient will: o Focus on medication adherence by continuing to use a pill box.  o Take medications as prescribed o Report any questions or concerns to PharmD and/or provider(s)  Please see past updates related to this goal by clicking on the "Past Updates" button in the selected goal          Obesity/Pre-Diabetes   BMP Latest Ref Rng & Units 07/24/2020 05/19/2020 05/14/2020  Glucose 65 - 99 mg/dL - 89 108(H)  BUN 4 - 21 23(A) 25(H) 17  Creatinine 0.5 - 1.1 1.0 0.88 0.76  BUN/Creat Ratio 9 - 23 - 28(H) 22  Sodium 134 - 144 mmol/L - 142 140  Potassium 3.5 - 5.2 mmol/L - 4.2 4.4  Chloride 96 - 106 mmol/L - 101 101  CO2 20 - 29 mmol/L - 26 25  Calcium 8.7 - 10.2 mg/dL - 9.8 10.1    Patient has failed these meds in past: Saxenda, metformin Patient is  currently uncontrolled on the following medications:  . Ozempic 0.25 mg weekly - patient stopped taking due to trying to find source of  pain/inflmmation  We discussed:  Patient has stopped Ozempic at this point due to pain/inflammation. She is also getting gel shots currently which are causing additional inflammation.  She wants to hold off resuming Ozempic until after the first of the year. We will talk in January about plan of resuming Ozempic.    Plan  Continue to hold Ozempic right now. Will discuss resuming therapy during visit in January.    Vaccines   Reviewed and discussed patient's vaccination history.    Immunization History  Administered Date(s) Administered  . Influenza Inj Mdck Quad Pf 07/22/2020  . Influenza Split 08/15/2013  . Influenza,inj,Quad PF,6+ Mos 12/04/2014, 08/29/2015, 07/26/2016, 08/11/2017, 08/06/2019  . Influenza-Unspecified 12/05/2015  . PFIZER SARS-COV-2 Vaccination 01/24/2020, 02/19/2020  . Pneumococcal Polysaccharide-23 12/18/2015  . Tdap 08/24/2013, 05/25/2016    Plan  Recommended patient receive third COVID vaccine when eligible.   Medication Management   Pt uses [facility] pharmacy for all medications Uses pill box? Yes Pt endorses good compliance  We discussed: Patient receiving medications through mail order due to cost savings.   Plan  Continue current medication management strategy    Follow up: 8 week phone visit

## 2020-09-15 NOTE — Patient Instructions (Addendum)
Visit Information  Goals Addressed            This Visit's Progress   . Pharmacy Care Plan       CARE PLAN ENTRY (see longitudinal plan of care for additional care plan information)  Current Barriers:  . Chronic Disease Management support, education, and care coordination needs related to Hypertension and Hyperlipidemia   Hypertension BP Readings from Last 3 Encounters:  05/19/20 140/72  04/15/20 120/70  03/11/20 (!) 156/93   . Pharmacist Clinical Goal(s): o Over the next 90 days, patient will work with PharmD and providers to achieve BP goal <130/80 . Current regimen:  o Furosemide 40 mg daily o Hydrochlorothiazide 25 mg daily o Quinapril 20 mg twice daily o Potassium 20 meq daily . Interventions: o Recommended walking as tolerated for exercise. Increase exercise as tolerated to achieve goal of >150 minutes of moderate exercise each week.  o Reviewed weight loss and increased activity.  . Patient self care activities - Over the next 90 days, patient will: o Check BP weekly, document, and provide at future appointments o Ensure daily salt intake < 2300 mg/day  Hyperlipidemia Lab Results  Component Value Date/Time   LDLCALC 73 05/19/2020 02:54 PM   LDLCALC 196 (H) 06/20/2019 09:08 AM   LDLDIRECT 222 (H) 04/26/2019 01:38 PM   . Pharmacist Clinical Goal(s): o Over the next 90 days, patient will work with PharmD and providers to achieve LDL goal < 70  . Current regimen:  o Repatha140 mg/ml into the skin every 14 days. . Interventions: o Discussed [employer] grant and how to manage with pharmacy to avoid being overcharged at refill. Patient will call in refill and request to be billed to [place].  o Discussed some of the healthy diet options are going to be limited with updated financial supplementation for food. Pharmacist has continued to research options. Meals on wheels is not an option due to age <2.  o Celebrated patient's weight loss and encouraged  continuing good habits.  . Patient self care activities - Over the next 90 days, patient will: o Continue healthy diet with available options.  o Continue taking Repatha every 2 weeks.  o Continue staying active.   Pre-Diabetes Lab Results  Component Value Date/Time   HGBA1C 6.0 (H) 04/15/2020 01:41 PM   HGBA1C 6.0 (H) 06/20/2019 09:08 AM   . Pharmacist Clinical Goal(s): o Over the next 90 days, patient will work with PharmD and providers to maintain A1c goal <6.5% . Current regimen:  o Ozempic 0.25 mg weekly . Interventions: o Keep up the good work with exercise.  o Discussed patient stopped Ozempic due to fatigue, pain and inflammation.  . Patient self care activities - Over the next 90 days, patient will: o Continue holding Ozempic. Will re-evaluate resuming treatment in January.  o Continue to work on [employer] and staying active.   Medication management . Pharmacist Clinical Goal(s): o Over the next 90 days, patient will work with PharmD and providers to achieve optimal medication adherence . Current pharmacy: [facility] . Interventions o Comprehensive medication review performed. o Continue current medication management strategy . Patient self care activities - Over the next 90 days, patient will: o Focus on medication adherence by continuing to use a pill box.  o Take medications as prescribed o Report any questions or concerns to PharmD and/or provider(s)  Please see past updates related to this goal by clicking on the "Past Updates" button in the selected  goal         The patient verbalized understanding of instructions, educational materials, and care plan provided today and declined offer to receive copy of patient instructions, educational materials, and care plan.   Telephone follow up appointment with pharmacy team member scheduled for: 11/2020  Sherre Poot, PharmD, [facility] Clinical Pharmacist [facility] [phone]  (office) [phone] (mobile)   Metabolic Syndrome Metabolic syndrome occurs when you have a combination of three or more factors that increase your chances of developing cardiovascular disease and diabetes. These factors include:  High fasting blood sugar (glucose).  High blood triglyceride level.  High blood pressure.  Low levels of high-density lipoprotein (HDL) blood cholesterol.  Having a waist measurement that is: ? More than 40 inches in men. ? More than 35 inches in women. Metabolic syndrome is sometimes called insulin resistance syndrome or syndrome X. What are the causes? The exact cause of this condition is not known. It may be related to a combination of the factors that were passed down from your parents (genes) and things that you do, eat, and drink (lifestyle choices). What increases the risk? You are more likely to develop this condition if you:  Eat a diet high in calories and saturated fat.  Do not exercise regularly.  Are obese.  Have a family history of type 2 diabetes mellitus.  Have insulin resistance.  Have a history of gestational diabetes during a previous pregnancy.  Have conditions such as cardiovascular disease, nonalcoholic fatty liver disease, or polycystic ovary syndrome (PCOS).  Are older. The risk increases with age.  Use any tobacco products, including cigarettes, chewing tobacco, or e-cigarettes. What are the signs or symptoms? Metabolic syndrome has no specific symptoms. Having abnormal blood test results may be the only signs of metabolic syndrome. How is this diagnosed? This condition may be diagnosed based on:  Your blood pressure measurements.  Your waist measurement.  Blood tests.  Your personal and family medical history. How is this treated? Treatment may include:  Lifestyle changes to reduce your risk for heart disease, stroke, and diabetes, such as: ? Exercise. ? Weight loss. ? Eating a healthy diet. ? Stopping  tobacco and nicotine use.  Medicines that: ? Help your body maintain normal blood glucose levels. ? Lower your blood pressure and your blood triglyceride levels. Follow these instructions at home:      Take over-the-counter and prescription medicines only as told by your health care provider.  Exercise regularly, as told by your health care provider.  Eat a healthy diet that includes fresh fruits and vegetables, whole grains, lean proteins, and low-fat or nonfat dairy products.  Maintain a healthy weight. Work with your health care provider to lose weight safely, if needed.  Do not use any products that contain nicotine or tobacco, such as cigarettes and e-cigarettes. If you need help quitting, ask your health care provider.  If directed, measure your waist regularly and write down the measurements. To measure your waist: ? Stand up straight. ? Breathe out. ? Wrap a measuring tape around the part of your waist that is just above your hip bones. ? Read and write down the measurement.  Keep all follow-up visits as told by your health care provider. This is important. Contact a health care provider if:  You feel very tired.  You are extremely thirsty.  You urinate a lot more than usual.  Your waist gets bigger.  You have headaches that do not go away. Get help  right away if:  You suddenly develop any of the following: ? Dizziness. ? Blurry vision. ? Trouble speaking. ? Trouble swallowing. ? Weakness in an arm or leg. ? Chest pain. ? Trouble breathing.  Your heartbeat feels abnormal.  You faint. Summary  Metabolic syndrome occurs when you have a combination of three or more factors that increase your chances of developing cardiovascular disease and diabetes.  These factors include a high fasting blood sugar (glucose), high blood triglyceride level, high blood pressure, low levels of high-density lipoprotein (HDL) blood cholesterol, and a waist measurement that is  more than 40 inches in men or more than 35 inches in women.  Metabolic syndrome has no specific symptoms. Having abnormal blood test results may be the only signs of metabolic syndrome.  Treatment may include lifestyle changes and medicine to reduce your risk for heart disease, stroke, and diabetes. This information is not intended to replace advice given to you by your health care provider. Make sure you discuss any questions you have with your health care provider. Document Revised: 10/31/2017 Document Reviewed: 10/31/2017 Elsevier Patient Education  2020 [employer].

## 2020-09-16 DIAGNOSIS — M1711 Unilateral primary osteoarthritis, right knee: Secondary | ICD-10-CM | POA: Diagnosis not present

## 2020-09-19 ENCOUNTER — Telehealth: Payer: Self-pay

## 2020-09-19 NOTE — Chronic Care Management (AMB) (Signed)
Chronic Care Management Pharmacy Assistant   Name: Laura [name]  MRN: [id number] DOB: 09/23/1966  Reason for Encounter: Medication Review   PCP : Lillard Anes, MD  Allergies:   Allergies  Allergen Reactions  . Meperidine Hives  . Shellfish Allergy Shortness Of Breath and Swelling  . Diltiazem Swelling  . Acebutolol Swelling  . Amlodipine     Swelling   . Codeine Hives and Nausea And Vomiting  . Cymbalta [Duloxetine Hcl] Other (See Comments)    Made pt feel crazy  . Dexilant [Dexlansoprazole] Other (See Comments)    Abdominal pain  . Flecainide Swelling  . Gabapentin Other (See Comments)    Makes her feel crazy   . Hydrocodone Other (See Comments)    Keeps patient awake.  . Losartan     Swelling   . Metoprolol Swelling  . Mexiletine     Swelling - hands, legs, face  . Mirtazapine Swelling  . Multaq [Dronedarone] Swelling  . Omeprazole     Abdominal pain  . Oxycodone Itching  . Sectral [Acebutolol Hcl] Swelling  . Toradol [Ketorolac Tromethamine] Itching  . Tramadol     Unable to sleep, makes her itch    Medications: Outpatient Encounter Medications as of 09/19/2020  Medication Sig  . albuterol (VENTOLIN HFA) 108 (90 Base) MCG/ACT inhaler Inhale 2 puffs into the lungs every 4 (four) hours as needed for wheezing or shortness of breath.  . ALPRAZolam (XANAX) 1 MG tablet Take 1 mg by mouth 4 (four) times daily.   Marland Kitchen amphetamine-dextroamphetamine (ADDERALL) 30 MG tablet Take 30 mg by mouth 3 (three) times daily.   . cloNIDine (CATAPRES) 0.1 MG tablet  (Patient not taking: Reported on 09/01/2020)  . EPINEPHrine 0.3 mg/0.3 mL IJ SOAJ injection Inject 0.3 mLs (0.3 mg total) into the muscle as needed for anaphylaxis.  . Evolocumab (REPATHA SURECLICK) 798 MG/ML SOAJ Inject 1 Dose into the skin every 14 (fourteen) days.  . furosemide (LASIX) 40 MG tablet Take 40 mg by mouth daily.   . hydrochlorothiazide (HYDRODIURIL) 25 MG tablet TAKE 1 TABLET(25 MG) BY  MOUTH TWICE DAILY  . HYDROmorphone (DILAUDID) 2 MG tablet Take 0.5 tablets (1 mg total) by mouth every 4 (four) hours as needed for severe pain.  Marland Kitchen levothyroxine (SYNTHROID) 112 MCG tablet Take 1 tablet (112 mcg total) by mouth daily.  . magnesium oxide (MAG-OX) 400 MG tablet Take 1 tablet (400 mg total) by mouth daily.  Marland Kitchen mexiletine (MEXITIL) 150 MG capsule   . pantoprazole (PROTONIX) 40 MG tablet Take 1 tablet (40 mg total) by mouth daily. (Patient taking differently: Take 40 mg by mouth daily. Taking three times weekly)  . potassium chloride SA (KLOR-CON) 20 MEQ tablet Take 1 tablet (20 mEq total) by mouth daily.  . quinapril (ACCUPRIL) 20 MG tablet Take 1 tablet (20 mg total) by mouth 2 (two) times daily.  . Semaglutide,0.25 or 0.5MG /DOS, (OZEMPIC, 0.25 OR 0.5 MG/DOSE,) 2 MG/1.5ML SOPN Inject 0.5 mg into the skin once a week. (Patient not taking: Reported on 09/15/2020)  . tamsulosin (FLOMAX) 0.4 MG CAPS capsule Take 1 capsule (0.4 mg total) by mouth daily after supper.  Marland Kitchen tiZANidine (ZANAFLEX) 4 MG tablet Take 4 mg by mouth 3 (three) times daily as needed.   . triamcinolone cream (KENALOG) 0.1 % Apply 1 application topically 2 (two) times daily.  . valACYclovir (VALTREX) 1000 MG tablet Take 1,000 mg by mouth 2 (two) times daily as needed.   . zolpidem (  AMBIEN) 10 MG tablet Take 10 mg by mouth at bedtime.   No facility-administered encounter medications on file as of 09/19/2020.    Current Diagnosis: Patient Active Problem List   Diagnosis Date Noted  . Candida vaginitis 07/22/2020  . BMI 31.0-31.9,adult 05/19/2020  . Cellulitis and abscess of foot 05/19/2020  . Pain in joint of left shoulder 12/03/2019  . Acute GI bleeding 09/01/2019  . Hypercalcemia 06/20/2019  . Nasal septal perforation 05/12/2018  . Fatty liver 08/11/2017  . Sleep apnea 08/11/2017  . Renal atrophy, right 07/18/2017  . Encounter for weight loss counseling 05/13/2017  . 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  Recurrent UTI 05/10/2017  . Status post hysterectomy 03/08/2017  . Barrett's esophagus 11/02/2016  . Difficulty with speech 10/18/2016  . Memory impairment 10/18/2016  . Chronic pain syndrome 10/12/2016  . Chronic low back pain (Location of Tertiary source of pain) (Bilateral) (L>R) 10/12/2016  . Lumbar facet joint syndrome 10/12/2016  . Chronic sacroiliac joint pain 10/12/2016  . Chronic shoulder pain (Location of Primary Source of Pain) (Bilateral) (R>L) 10/12/2016  . Chronic neck pain (Location of Secondary source of pain) (Bilateral) (R>L) 10/12/2016  . Chronic upper back pain (Bilateral) (L>R) 10/12/2016  . Chronic hand pain (Bilateral) (R>L) 10/12/2016  . Chronic hand pain (Right) 10/12/2016  . Chronic hand pain (Left) 10/12/2016  . Chronic knee pain (Right) 10/12/2016  . CKD (chronic kidney disease) 10/11/2016  . Vitamin B12 deficiency 07/26/2016  . Anxiety 07/06/2016  . Depression 07/06/2016  . Raynaud disease 05/06/2016  . Fibromyalgia 02/13/2016  . Prediabetes 12/23/2015  . Heart palpitations 12/16/2015  . Heart murmur 12/16/2015  . Mixed hyperlipidemia 11/19/2015  . Gastroesophageal reflux disease 06/13/2015  . Osteoarthrosis, generalized, multiple joints 06/06/2015  . Vaginal enterocele   . Urinary retention with incomplete bladder emptying   . Obesity, Class I, BMI 30.0-34.9 (see actual BMI)   . Rectocele   . Cystocele   . Hypothyroidism, unspecified 12/03/2014  . Essential hypertension 12/03/2014  . Vitamin D deficiency 12/03/2014  . Asthma in adult, mild persistent, uncomplicated 17/40/8144  . Insomnia 08/31/2013     Follow-Up:  Patient Assistance Coordination - Reviewed chart and adherence measures. Per insurance data, medication adherence for hypertension 100% med compliance .  Elly Modena Brown,CPP. Notified  Judithann Sheen, [place] Pharmacist Assistant [phone]

## 2020-09-26 DIAGNOSIS — G4733 Obstructive sleep apnea (adult) (pediatric): Secondary | ICD-10-CM | POA: Diagnosis not present

## 2020-10-01 ENCOUNTER — Telehealth: Payer: Self-pay | Admitting: Internal Medicine

## 2020-10-01 NOTE — Telephone Encounter (Signed)
PA for repatha submitted via CMM (Key: [id number])

## 2020-10-02 NOTE — Telephone Encounter (Signed)
Patient approved for repatha sureclick until 08/56/9437

## 2020-10-13 DIAGNOSIS — G629 Polyneuropathy, unspecified: Secondary | ICD-10-CM | POA: Diagnosis not present

## 2020-10-13 DIAGNOSIS — D539 Nutritional anemia, unspecified: Secondary | ICD-10-CM | POA: Diagnosis not present

## 2020-10-13 DIAGNOSIS — I10 Essential (primary) hypertension: Secondary | ICD-10-CM | POA: Diagnosis not present

## 2020-10-13 DIAGNOSIS — R600 Localized edema: Secondary | ICD-10-CM | POA: Diagnosis not present

## 2020-10-13 DIAGNOSIS — N261 Atrophy of kidney (terminal): Secondary | ICD-10-CM | POA: Diagnosis not present

## 2020-10-13 DIAGNOSIS — Z87442 Personal history of urinary calculi: Secondary | ICD-10-CM | POA: Diagnosis not present

## 2020-10-26 DIAGNOSIS — G4733 Obstructive sleep apnea (adult) (pediatric): Secondary | ICD-10-CM | POA: Diagnosis not present

## 2020-10-29 DIAGNOSIS — H9203 Otalgia, bilateral: Secondary | ICD-10-CM | POA: Diagnosis not present

## 2020-10-29 DIAGNOSIS — R059 Cough, unspecified: Secondary | ICD-10-CM | POA: Diagnosis not present

## 2020-11-07 ENCOUNTER — Telehealth: Payer: Self-pay

## 2020-11-07 NOTE — Progress Notes (Signed)
Chronic Care Management Pharmacy Assistant   Name: Laura [name]  MRN: [id number] DOB: 1965-11-30  Reason for Encounter: Coordination for renewal for Ozempic patient assistance.  Donette Larry, CPP requested a PAP form for renewal of the patients Ozempic medication be filled out.  The form was filled out and sent to Donette Larry, CPP, for signatures and approval.  A message was left for the patient that the form would be ready to sign on 11/12/2020 and was told to bring proof of income.     PCP : Lillard Anes, MD  Allergies:   Allergies  Allergen Reactions   Meperidine Hives   Shellfish Allergy Shortness Of Breath and Swelling   Diltiazem Swelling   Acebutolol Swelling   Amlodipine     Swelling    Codeine Hives and Nausea And Vomiting   Cymbalta [Duloxetine Hcl] Other (See Comments)    Made pt feel crazy   Dexilant [Dexlansoprazole] Other (See Comments)    Abdominal pain   Flecainide Swelling   Gabapentin Other (See Comments)    Makes her feel crazy    Hydrocodone Other (See Comments)    Keeps patient awake.   Losartan     Swelling    Metoprolol Swelling   Mexiletine     Swelling - hands, legs, face   Mirtazapine Swelling   Multaq [Dronedarone] Swelling   Omeprazole     Abdominal pain   Oxycodone Itching   Sectral [Acebutolol Hcl] Swelling   Toradol [Ketorolac Tromethamine] Itching   Tramadol     Unable to sleep, makes her itch    Medications: Outpatient Encounter Medications as of 11/07/2020  Medication Sig   albuterol (VENTOLIN HFA) 108 (90 Base) MCG/ACT inhaler Inhale 2 puffs into the lungs every 4 (four) hours as needed for wheezing or shortness of breath.   ALPRAZolam (XANAX) 1 MG tablet Take 1 mg by mouth 4 (four) times daily.    amphetamine-dextroamphetamine (ADDERALL) 30 MG tablet Take 30 mg by mouth 3 (three) times daily.    cloNIDine (CATAPRES) 0.1 MG tablet  (Patient not taking: Reported on 09/01/2020)    EPINEPHrine 0.3 mg/0.3 mL IJ SOAJ injection Inject 0.3 mLs (0.3 mg total) into the muscle as needed for anaphylaxis.   Evolocumab (REPATHA SURECLICK) 326 MG/ML SOAJ Inject 1 Dose into the skin every 14 (fourteen) days.   furosemide (LASIX) 40 MG tablet Take 40 mg by mouth daily.    hydrochlorothiazide (HYDRODIURIL) 25 MG tablet TAKE 1 TABLET(25 MG) BY MOUTH TWICE DAILY   HYDROmorphone (DILAUDID) 2 MG tablet Take 0.5 tablets (1 mg total) by mouth every 4 (four) hours as needed for severe pain.   levothyroxine (SYNTHROID) 112 MCG tablet Take 1 tablet (112 mcg total) by mouth daily.   magnesium oxide (MAG-OX) 400 MG tablet Take 1 tablet (400 mg total) by mouth daily.   mexiletine (MEXITIL) 150 MG capsule    pantoprazole (PROTONIX) 40 MG tablet Take 1 tablet (40 mg total) by mouth daily. (Patient taking differently: Take 40 mg by mouth daily. Taking three times weekly)   potassium chloride SA (KLOR-CON) 20 MEQ tablet Take 1 tablet (20 mEq total) by mouth daily.   quinapril (ACCUPRIL) 20 MG tablet Take 1 tablet (20 mg total) by mouth 2 (two) times daily.   Semaglutide,0.25 or 0.5MG /DOS, (OZEMPIC, 0.25 OR 0.5 MG/DOSE,) 2 MG/1.5ML SOPN Inject 0.5 mg into the skin once a week. (Patient not taking: Reported on 09/15/2020)   tamsulosin (FLOMAX) 0.4 MG CAPS capsule  Take 1 capsule (0.4 mg total) by mouth daily after supper.   tiZANidine (ZANAFLEX) 4 MG tablet Take 4 mg by mouth 3 (three) times daily as needed.    triamcinolone cream (KENALOG) 0.1 % Apply 1 application topically 2 (two) times daily.   valACYclovir (VALTREX) 1000 MG tablet Take 1,000 mg by mouth 2 (two) times daily as needed.    zolpidem (AMBIEN) 10 MG tablet Take 10 mg by mouth at bedtime.   No facility-administered encounter medications on file as of 11/07/2020.    Current Diagnosis: Patient Active Problem List   Diagnosis Date Noted   Candida vaginitis 07/22/2020   BMI 31.0-31.9,adult 05/19/2020   Cellulitis and  abscess of foot 05/19/2020   Pain in joint of left shoulder 12/03/2019   Acute GI bleeding 09/01/2019   Hypercalcemia 06/20/2019   Nasal septal perforation 05/12/2018   Fatty liver 08/11/2017   Sleep apnea 08/11/2017   Renal atrophy, right 07/18/2017   Encounter for weight loss counseling 05/13/2017   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   Recurrent UTI 05/10/2017   Status post hysterectomy 03/08/2017   Barrett's esophagus 11/02/2016   Difficulty with speech 10/18/2016   Memory impairment 10/18/2016   Chronic pain syndrome 10/12/2016   Chronic low back pain (Location of Tertiary source of pain) (Bilateral) (L>R) 10/12/2016   Lumbar facet joint syndrome 10/12/2016   Chronic sacroiliac joint pain 10/12/2016   Chronic shoulder pain (Location of Primary Source of Pain) (Bilateral) (R>L) 10/12/2016   Chronic neck pain (Location of Secondary source of pain) (Bilateral) (R>L) 10/12/2016   Chronic upper back pain (Bilateral) (L>R) 10/12/2016   Chronic hand pain (Bilateral) (R>L) 10/12/2016   Chronic hand pain (Right) 10/12/2016   Chronic hand pain (Left) 10/12/2016   Chronic knee pain (Right) 10/12/2016   CKD (chronic kidney disease) 10/11/2016   Vitamin B12 deficiency 07/26/2016   Anxiety 07/06/2016   Depression 07/06/2016   Raynaud disease 05/06/2016   Fibromyalgia 02/13/2016   Prediabetes 12/23/2015   Heart palpitations 12/16/2015   Heart murmur 12/16/2015   Mixed hyperlipidemia 11/19/2015   Gastroesophageal reflux disease 06/13/2015   Osteoarthrosis, generalized, multiple joints 06/06/2015   Vaginal enterocele    Urinary retention with incomplete bladder emptying    Obesity, Class I, BMI 30.0-34.9 (see actual BMI)    Rectocele    Cystocele    Hypothyroidism, unspecified 12/03/2014   Essential hypertension 12/03/2014   Vitamin D deficiency 12/03/2014   Asthma in adult, mild persistent, uncomplicated 81/85/6314   Insomnia  08/31/2013   Follow-Up:  Patient Assistance Coordination  Donette Larry, CPP notified  Clarita Leber, [place] Pharmacist Assistant [phone]

## 2020-11-10 ENCOUNTER — Ambulatory Visit: Payer: Medicare Other

## 2020-11-10 ENCOUNTER — Other Ambulatory Visit: Payer: Self-pay

## 2020-11-10 DIAGNOSIS — I10 Essential (primary) hypertension: Secondary | ICD-10-CM

## 2020-11-10 DIAGNOSIS — R7303 Prediabetes: Secondary | ICD-10-CM

## 2020-11-10 NOTE — Chronic Care Management (AMB) (Signed)
Chronic Care Management Pharmacy  Name: Laura [name]  MRN: [id number] DOB: 12/02/1965  Chief Complaint/ HPI  Laura [name],  55 y.o. , female presents for their Follow-Up CCM visit with the clinical pharmacist via telephone due to COVID-19 Pandemic.  PCP : Abigail Miyamoto, MD  Their chronic conditions include: HTN, Asthma, Sleep apnea, GERD, Hypothyroidism, Osteoarthrosis, CKD, Fibromyalgia, Chronic pain syndrome, Vitamin D deficiency, Mixed hyperlipidemia,  Vitamin b12 deficiency, anxiety, depression, prediabetes, insomnia.   Office Visits: 07/22/2020 - fluconazole for vaginitis. Ozempic for weight loss.  06/30/2020 - ordered stat CT abdomen and pelvis for renal stones. Dilaudid for pain.  05/19/2020 - CBC normal, triglycerides high- watch diet, TSH 1.06 normal, BUN up, may be dehydrated, kidney and liver tests normal. Wegovy prescribed for weight loss.  04/15/2020 - CBC normal, glucose 108, kidney tests normal, liver tests normal Cholesterol LdL high  and triglycerides high- need low cholesterol diet, A1c 6.0, urinalysis normal. Premarin for vaginal atrophy. Augmentin and fluconazole given for sinus infection. Valtrex prescribed.  02/19/2020 - Covid Vaccine.  01/24/2020 - Covid Vaccine  Consult Visit: 10/13/2020 - Nephrology - check vitamin B12 and TSH at next visit.  Medications: Outpatient Encounter Medications as of 11/10/2020  Medication Sig  . albuterol (VENTOLIN HFA) 108 (90 Base) MCG/ACT inhaler Inhale 2 puffs into the lungs every 4 (four) hours as needed for wheezing or shortness of breath.  . ALPRAZolam (XANAX) 1 MG tablet Take 1 mg by mouth 4 (four) times daily.   Marland Kitchen amphetamine-dextroamphetamine (ADDERALL) 30 MG tablet Take 30 mg by mouth 3 (three) times daily.   Marland Kitchen EPINEPHrine 0.3 mg/0.3 mL IJ SOAJ injection Inject 0.3 mLs (0.3 mg total) into the muscle as needed for anaphylaxis.  . Evolocumab (REPATHA SURECLICK) 140 MG/ML SOAJ Inject 1 Dose into the skin every 14  (fourteen) days.  . furosemide (LASIX) 40 MG tablet Take 40 mg by mouth daily.   Marland Kitchen HYDROmorphone (DILAUDID) 2 MG tablet Take 0.5 tablets (1 mg total) by mouth every 4 (four) hours as needed for severe pain.  Marland Kitchen levothyroxine (SYNTHROID) 112 MCG tablet Take 1 tablet (112 mcg total) by mouth daily.  . magnesium oxide (MAG-OX) 400 MG tablet Take 1 tablet (400 mg total) by mouth daily.  Marland Kitchen mexiletine (MEXITIL) 150 MG capsule   . pantoprazole (PROTONIX) 40 MG tablet Take 1 tablet (40 mg total) by mouth daily. (Patient taking differently: Take 40 mg by mouth daily. Taking three times weekly)  . potassium chloride SA (KLOR-CON) 20 MEQ tablet Take 1 tablet (20 mEq total) by mouth daily.  . quinapril (ACCUPRIL) 20 MG tablet Take 1 tablet (20 mg total) by mouth 2 (two) times daily.  . Semaglutide,0.25 or 0.5MG /DOS, (OZEMPIC, 0.25 OR 0.5 MG/DOSE,) 2 MG/1.5ML SOPN Inject 0.5 mg into the skin once a week.  . tamsulosin (FLOMAX) 0.4 MG CAPS capsule Take 1 capsule (0.4 mg total) by mouth daily after supper.  Marland Kitchen tiZANidine (ZANAFLEX) 4 MG tablet Take 4 mg by mouth 3 (three) times daily as needed.   . triamcinolone cream (KENALOG) 0.1 % Apply 1 application topically 2 (two) times daily.  . valACYclovir (VALTREX) 1000 MG tablet Take 1,000 mg by mouth 2 (two) times daily as needed.   . zolpidem (AMBIEN) 10 MG tablet Take 10 mg by mouth at bedtime.  . cloNIDine (CATAPRES) 0.1 MG tablet  (Patient not taking: No sig reported)  . hydrochlorothiazide (HYDRODIURIL) 25 MG tablet TAKE 1 TABLET(25 MG) BY MOUTH TWICE DAILY (Patient not  taking: Reported on 11/10/2020)   No facility-administered encounter medications on file as of 11/10/2020.    Allergies  Allergen Reactions  . Meperidine Hives  . Shellfish Allergy Shortness Of Breath and Swelling  . Diltiazem Swelling  . Acebutolol Swelling  . Amlodipine     Swelling   . Codeine Hives and Nausea And Vomiting  . Cymbalta [Duloxetine Hcl] Other (See Comments)    Made pt  feel crazy  . Dexilant [Dexlansoprazole] Other (See Comments)    Abdominal pain  . Flecainide Swelling  . Gabapentin Other (See Comments)    Makes her feel crazy   . Hydrocodone Other (See Comments)    Keeps patient awake.  . Losartan     Swelling   . Metoprolol Swelling  . Mexiletine     Swelling - hands, legs, face  . Mirtazapine Swelling  . Multaq [Dronedarone] Swelling  . Omeprazole     Abdominal pain  . Oxycodone Itching  . Sectral [Acebutolol Hcl] Swelling  . Toradol [Ketorolac Tromethamine] Itching  . Tramadol     Unable to sleep, makes her itch     SDOH Screenings   Alcohol Screen: Not on file  Depression (WUJ8-1): Not on file  Financial Resource Strain: Not on file  Food Insecurity: Food Insecurity Present  . Worried About Programme researcher, broadcasting/film/video in the Last Year: Often true  . Ran Out of Food in the Last Year: Often true  Housing: Not on file  Physical Activity: Not on file  Social Connections: Not on file  Stress: Not on file  Tobacco Use: Medium Risk  . Smoking Tobacco Use: Former Smoker  . Smokeless Tobacco Use: Never Used  Transportation Needs: Not on file   Current Diagnosis/Assessment:  Goals Addressed            This Visit's Progress   . Pharmacy Care Plan       CARE PLAN ENTRY (see longitudinal plan of care for additional care plan information)  Current Barriers:  . Chronic Disease Management support, education, and care coordination needs related to Hypertension and Hyperlipidemia   Hypertension BP Readings from Last 3 Encounters:  05/19/20 140/72  04/15/20 120/70  03/11/20 (!) 156/93   . Pharmacist Clinical Goal(s): o Over the next 90 days, patient will work with PharmD and providers to achieve BP goal <130/80 . Current regimen:  o Furosemide 40 mg daily o Hydrochlorothiazide 25 mg daily o Quinapril 20 mg twice daily o Potassium 20 meq daily . Interventions: o Recommended walking as tolerated for exercise. Increase exercise as  tolerated to achieve goal of >150 minutes of moderate exercise each week.  o Reviewed weight loss and increased activity.  . Patient self care activities - Over the next 90 days, patient will: o Check BP weekly, document, and provide at future appointments o Ensure daily salt intake < 2300 mg/day  Hyperlipidemia Lab Results  Component Value Date/Time   LDLCALC 73 05/19/2020 02:54 PM   LDLCALC 196 (H) 06/20/2019 09:08 AM   LDLDIRECT 222 (H) 04/26/2019 01:38 PM   . Pharmacist Clinical Goal(s): o Over the next 90 days, patient will work with PharmD and providers to achieve LDL goal < 70  . Current regimen:  o Repatha140 mg/ml into the skin every 14 days. . Interventions: o Discussed [employer] grant and how to manage with pharmacy to avoid being overcharged at refill. Patient will call in refill and request to be billed to [facility] grant.  o Discussed some of the  healthy diet options are going to be limited with updated financial supplementation for food. Pharmacist has continued to research options. Meals on wheels is not an option due to age <18.  o Celebrated patient's weight loss and encouraged continuing good habits.  . Patient self care activities - Over the next 90 days, patient will: o Continue healthy diet with available options.  o Continue taking Repatha every 2 weeks.  o Continue staying active.   Pre-Diabetes Lab Results  Component Value Date/Time   HGBA1C 6.0 (H) 04/15/2020 01:41 PM   HGBA1C 6.0 (H) 06/20/2019 09:08 AM   . Pharmacist Clinical Goal(s): o Over the next 90 days, patient will work with PharmD and providers to maintain A1c goal <6.5% . Current regimen:  o Ozempic 0.5 mg weekly . Interventions: o Keep up the good work with exercise/activity as tolerated. o Discussed patient tolerating retrial Ozempic 0.25 mg weekly and self-increased to 0.5 mg weekly again to try.  . Patient self care activities - Over the next 90 days, patient will: o Continue Ozempic  0.5 mg weekly as tolerated.  o Continue to work on [employer] and staying active.   Medication management . Pharmacist Clinical Goal(s): o Over the next 90 days, patient will work with PharmD and providers to achieve optimal medication adherence . Current pharmacy: [facility] . Interventions o Comprehensive medication review performed. o Continue current medication management strategy . Patient self care activities - Over the next 90 days, patient will: o Focus on medication adherence by continuing to use a pill box.  o Take medications as prescribed o Report any questions or concerns to PharmD and/or provider(s)  Please see past updates related to this goal by clicking on the "Past Updates" button in the selected goal          Obesity/Pre-Diabetes   BMP Latest Ref Rng & Units 07/24/2020 05/19/2020 05/14/2020  Glucose 65 - 99 mg/dL - 89 308(M)  BUN 4 - 21 23(A) 25(H) 17  Creatinine 0.5 - 1.1 1.0 0.88 0.76  BUN/Creat Ratio 9 - 23 - 28(H) 22  Sodium 134 - 144 mmol/L - 142 140  Potassium 3.5 - 5.2 mmol/L - 4.2 4.4  Chloride 96 - 106 mmol/L - 101 101  CO2 20 - 29 mmol/L - 26 25  Calcium 8.7 - 10.2 mg/dL - 9.8 57.8   Recent Relevant Labs: Lab Results  Component Value Date/Time   HGBA1C 6.0 (H) 04/15/2020 01:41 PM   HGBA1C 6.0 (H) 06/20/2019 09:08 AM     Patient has failed these meds in past: Saxenda, metformin Patient is currently uncontrolled on the following medications:  . Ozempic 0.5 mg weekly  We discussed:   Patient plans to stop by office and complete Ozempic renewal signature/ proof of income paperwork for 2022. Pharmacist will submit.   Patient's current weight 160 lbs. Ozempic 0.5 mg weekly first dose this week. Tolerated 0.25 mg weekly for the last few weeks well. Patient has cut back on sugar in diet to reduce inflammation. Eating apples and berries and low glycemic foods. Patient reports if she does not tolerate ozempic 0.5 mg weekly she will  resume 0.25 mg weekly. Patient notices slight nausea for the day after ozempic.   Using MyFitness pal to log her food and intake.Following Next [employer] eating plan.   Working part-time (15 hours a week) at [employer]. Needs a knee replacement and knee isn't tolerating concrete floor well. Has had gel injection unsuccessfully.    Plan  Continue Ozempic 0.5 mg weekly as tolerated. Contact pharmacist or provider if any tolerability issues.   Hypertension   BP today is:  Has not checked today.   Office blood pressures are  BP Readings from Last 3 Encounters:  08/14/20 125/83  07/31/20 114/68  07/30/20 (!) 151/88   Lab Results  Component Value Date   CREATININE 1.0 07/24/2020   BUN 23 (A) 07/24/2020   GFRNONAA 75 05/19/2020   GFRAA 86 05/19/2020   NA 142 05/19/2020   K 4.2 05/19/2020   CALCIUM 9.8 05/19/2020   CO2 26 05/19/2020     Patient has failed these meds in the past: clonidine Patient is currently controlled on the following medications:   Quinapril 20 mg bid   HCTZ 25 mg bid   Patient checks BP at home weekly  Patient home BP readings are ranging:  BP is fluctuating a lot.   We discussed diet and exercise extensively. Eating 64 grams daily of protein. Working to eat less processed items. Patient's nephrologist is monitoring bp. Fluctuations are limiting increasing or adding medication at this time. Patient's knee and back pain limits exercise. Patient is working 16 hours a week at [employer].   Plan  Continue current medications      Vaccines   Reviewed and discussed patient's vaccination history.    Immunization History  Administered Date(s) Administered  . Influenza Inj Mdck Quad Pf 07/22/2020  . Influenza Split 08/15/2013  . Influenza,inj,Quad PF,6+ Mos 12/04/2014, 08/29/2015, 07/26/2016, 08/11/2017, 08/06/2019  . Influenza-Unspecified 12/05/2015  . PFIZER SARS-COV-2 Vaccination 01/24/2020, 02/19/2020  . Pneumococcal Polysaccharide-23 12/18/2015  . Tdap  08/24/2013, 05/25/2016    Plan  Recommended patient receive third COVID vaccine.  Medication Management   Pt uses [facility] pharmacy for all medications Uses pill box? Yes Pt endorses good compliance  We discussed: Patient receiving medications through mail order due to cost savings.   Plan  Continue current medication management strategy    Follow up: 3 month phone visit

## 2020-11-11 NOTE — Patient Instructions (Addendum)
Visit Information  Goals Addressed            This Visit's Progress   . Pharmacy Care Plan       CARE PLAN ENTRY (see longitudinal plan of care for additional care plan information)  Current Barriers:  . Chronic Disease Management support, education, and care coordination needs related to Hypertension and Hyperlipidemia   Hypertension BP Readings from Last 3 Encounters:  05/19/20 140/72  04/15/20 120/70  03/11/20 (!) 156/93   . Pharmacist Clinical Goal(s): o Over the next 90 days, patient will work with PharmD and providers to achieve BP goal <130/80 . Current regimen:  o Furosemide 40 mg daily o Hydrochlorothiazide 25 mg daily o Quinapril 20 mg twice daily o Potassium 20 meq daily . Interventions: o Recommended walking as tolerated for exercise. Increase exercise as tolerated to achieve goal of >150 minutes of moderate exercise each week.  o Reviewed weight loss and increased activity.  . Patient self care activities - Over the next 90 days, patient will: o Check BP weekly, document, and provide at future appointments o Ensure daily salt intake < 2300 mg/day  Hyperlipidemia Lab Results  Component Value Date/Time   LDLCALC 73 05/19/2020 02:54 PM   LDLCALC 196 (H) 06/20/2019 09:08 AM   LDLDIRECT 222 (H) 04/26/2019 01:38 PM   . Pharmacist Clinical Goal(s): o Over the next 90 days, patient will work with PharmD and providers to achieve LDL goal < 70  . Current regimen:  o Repatha140 mg/ml into the skin every 14 days. . Interventions: o Discussed [employer] grant and how to manage with pharmacy to avoid being overcharged at refill. Patient will call in refill and request to be billed to [place].  o Discussed some of the healthy diet options are going to be limited with updated financial supplementation for food. Pharmacist has continued to research options. Meals on wheels is not an option due to age <80.  o Celebrated patient's weight loss and encouraged  continuing good habits.  . Patient self care activities - Over the next 90 days, patient will: o Continue healthy diet with available options.  o Continue taking Repatha every 2 weeks.  o Continue staying active.   Pre-Diabetes Lab Results  Component Value Date/Time   HGBA1C 6.0 (H) 04/15/2020 01:41 PM   HGBA1C 6.0 (H) 06/20/2019 09:08 AM   . Pharmacist Clinical Goal(s): o Over the next 90 days, patient will work with PharmD and providers to maintain A1c goal <6.5% . Current regimen:  o Ozempic 0.5 mg weekly . Interventions: o Keep up the good work with exercise/activity as tolerated. o Discussed patient tolerating retrial Ozempic 0.25 mg weekly and self-increased to 0.5 mg weekly again to try.  . Patient self care activities - Over the next 90 days, patient will: o Continue Ozempic 0.5 mg weekly as tolerated.  o Continue to work on [employer] and staying active.   Medication management . Pharmacist Clinical Goal(s): o Over the next 90 days, patient will work with PharmD and providers to achieve optimal medication adherence . Current pharmacy: [facility] . Interventions o Comprehensive medication review performed. o Continue current medication management strategy . Patient self care activities - Over the next 90 days, patient will: o Focus on medication adherence by continuing to use a pill box.  o Take medications as prescribed o Report any questions or concerns to PharmD and/or provider(s)  Please see past updates related to this goal by clicking on the "  Past Updates" button in the selected goal         The patient verbalized understanding of instructions, educational materials, and care plan provided today and declined offer to receive copy of patient instructions, educational materials, and care plan.   Telephone follow up appointment with pharmacy team member scheduled for: 01/2021  Laura [name], [facility]  Exercises to do While Sitting  Exercises that  you do while sitting (chair exercises) can give you many of the same benefits as full exercise. Benefits include strengthening your heart, burning calories, and keeping muscles and joints healthy. Exercise can also improve your mood and help with depression and anxiety. You may benefit from chair exercises if you are unable to do standing exercises because of:  Diabetic foot pain.  Obesity.  Illness.  Arthritis.  Recovery from surgery or injury.  Breathing problems.  Balance problems.  Another type of disability. Before starting chair exercises, check with your health care provider or a physical therapist to find out how much exercise you can tolerate and which exercises are safe for you. If your health care provider approves:  Start out slowly and build up over time. Aim to work up to about 10-20 minutes for each exercise session.  Make exercise part of your daily routine.  Drink water when you exercise. Do not wait until you are thirsty. Drink every 10-15 minutes.  Stop exercising right away if you have pain, nausea, shortness of breath, or dizziness.  If you are exercising in a wheelchair, make sure to lock the wheels.  Ask your health care provider whether you can do tai chi or yoga. Many positions in these mind-body exercises can be modified to do while seated. Warm-up Before starting other exercises: 1. Sit up as straight as you can. Have your knees bent at 90 degrees, which is the shape of the capital letter "L." Keep your feet flat on the floor. 2. Sit at the front edge of your chair, if you can. 3. Pull in (tighten) the muscles in your abdomen and stretch your spine and neck as straight as you can. Hold this position for a few minutes. 4. Breathe in and out evenly. Try to concentrate on your breathing, and relax your mind. Stretching Exercise A: Arm stretch 1. Hold your arms out straight in front of your body. 2. Bend your hands at the wrist with your fingers pointing  up, as if signaling someone to stop. Notice the slight tension in your forearms as you hold the position. 3. Keeping your arms out and your hands bent, rotate your hands outward as far as you can and hold this stretch. Aim to have your thumbs pointing up and your pinkie fingers pointing down. Slowly repeat arm stretches for one minute as tolerated. Exercise B: Leg stretch 1. If you can move your legs, try to "draw" letters on the floor with the toes of your foot. Write your name with one foot. 2. Write your name with the toes of your other foot. Slowly repeat the movements for one minute as tolerated. Exercise C: Reach for the sky 1. Reach your hands as far over your head as you can to stretch your spine. 2. Move your hands and arms as if you are climbing a rope. Slowly repeat the movements for one minute as tolerated. Range of motion exercises Exercise A: Shoulder roll 1. Let your arms hang loosely at your sides. 2. Lift just your shoulders up toward your ears, then let them relax back  down. 3. When your shoulders feel loose, rotate your shoulders in backward and forward circles. Do shoulder rolls slowly for one minute as tolerated. Exercise B: March in place 1. As if you are marching, pump your arms and lift your legs up and down. Lift your knees as high as you can. ? If you are unable to lift your knees, just pump your arms and move your ankles and feet up and down. March in place for one minute as tolerated. Exercise C: Seated jumping jacks 1. Let your arms hang down straight. 2. Keeping your arms straight, lift them up over your head. Aim to point your fingers to the ceiling. 3. While you lift your arms, straighten your legs and slide your heels along the floor to your sides, as wide as you can. 4. As you bring your arms back down to your sides, slide your legs back together. ? If you are unable to use your legs, just move your arms. Slowly repeat seated jumping jacks for one minute  as tolerated. Strengthening exercises Exercise A: Shoulder squeeze 1. Hold your arms straight out from your body to your sides, with your elbows bent and your fists pointed at the ceiling. 2. Keeping your arms in the bent position, move them forward so your elbows and forearms meet in front of your face. 3. Open your arms back out as wide as you can with your elbows still bent, until you feel your shoulder blades squeezing together. Hold for 5 seconds. Slowly repeat the movements forward and backward for one minute as tolerated. Contact a health care provider if you:  Had to stop exercising due to any of the following: ? Pain. ? Nausea. ? Shortness of breath. ? Dizziness. ? Fatigue.  Have significant pain or soreness after exercising. Get help right away if you have:  Chest pain.  Difficulty breathing. These symptoms may represent a serious problem that is an emergency. Do not wait to see if the symptoms will go away. Get medical help right away. Call your local emergency services (911 in the [place] Do not drive yourself to the hospital. This information is not intended to replace advice given to you by your health care provider. Make sure you discuss any questions you have with your health care provider. Document Revised: 02/14/2020 Document Reviewed: 02/14/2020 Elsevier Patient Education  2021 [employer].

## 2020-11-12 ENCOUNTER — Encounter: Payer: Self-pay | Admitting: Legal Medicine

## 2020-11-12 ENCOUNTER — Telehealth (INDEPENDENT_AMBULATORY_CARE_PROVIDER_SITE_OTHER): Payer: Medicare Other | Admitting: Legal Medicine

## 2020-11-12 VITALS — Temp 101.0°F | Ht 65.0 in | Wt 157.4 lb

## 2020-11-12 DIAGNOSIS — U071 COVID-19: Secondary | ICD-10-CM | POA: Diagnosis not present

## 2020-11-12 DIAGNOSIS — R6889 Other general symptoms and signs: Secondary | ICD-10-CM | POA: Diagnosis not present

## 2020-11-12 DIAGNOSIS — I493 Ventricular premature depolarization: Secondary | ICD-10-CM | POA: Insufficient documentation

## 2020-11-12 DIAGNOSIS — Z9189 Other specified personal risk factors, not elsewhere classified: Secondary | ICD-10-CM

## 2020-11-12 LAB — POCT INFLUENZA A/B
Influenza A, POC: NEGATIVE
Influenza B, POC: NEGATIVE

## 2020-11-12 LAB — POC COVID19 BINAXNOW: SARS Coronavirus 2 Ag: POSITIVE — AB

## 2020-11-12 NOTE — Progress Notes (Signed)
Covid positive, negative flu lp
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
## 2020-11-17 ENCOUNTER — Other Ambulatory Visit: Payer: Self-pay | Admitting: Legal Medicine

## 2020-11-17 DIAGNOSIS — E119 Type 2 diabetes mellitus without complications: Secondary | ICD-10-CM | POA: Diagnosis not present

## 2020-11-17 DIAGNOSIS — I10 Essential (primary) hypertension: Secondary | ICD-10-CM | POA: Diagnosis not present

## 2020-11-17 DIAGNOSIS — J45909 Unspecified asthma, uncomplicated: Secondary | ICD-10-CM | POA: Diagnosis not present

## 2020-11-17 DIAGNOSIS — M1990 Unspecified osteoarthritis, unspecified site: Secondary | ICD-10-CM | POA: Diagnosis not present

## 2020-11-17 DIAGNOSIS — E7800 Pure hypercholesterolemia, unspecified: Secondary | ICD-10-CM | POA: Diagnosis not present

## 2020-11-17 DIAGNOSIS — U071 COVID-19: Secondary | ICD-10-CM | POA: Diagnosis not present

## 2020-11-17 DIAGNOSIS — Z79899 Other long term (current) drug therapy: Secondary | ICD-10-CM | POA: Diagnosis not present

## 2020-11-17 DIAGNOSIS — E039 Hypothyroidism, unspecified: Secondary | ICD-10-CM | POA: Diagnosis not present

## 2020-11-17 DIAGNOSIS — L509 Urticaria, unspecified: Secondary | ICD-10-CM | POA: Diagnosis not present

## 2020-11-17 NOTE — Progress Notes (Unsigned)
nimol

## 2020-11-19 ENCOUNTER — Other Ambulatory Visit: Payer: Self-pay

## 2020-11-19 ENCOUNTER — Other Ambulatory Visit: Payer: Self-pay | Admitting: Legal Medicine

## 2020-11-19 MED ORDER — BENZONATATE 100 MG PO CAPS: 100.0000 mg | ORAL_CAPSULE | Freq: Two times a day (BID) | ORAL | 2 refills | Status: DC | PRN

## 2020-11-19 MED ORDER — HYDROXYZINE PAMOATE 100 MG PO CAPS: 100.0000 mg | ORAL_CAPSULE | Freq: Three times a day (TID) | ORAL | 0 refills | Status: DC | PRN

## 2020-11-20 DIAGNOSIS — R059 Cough, unspecified: Secondary | ICD-10-CM | POA: Diagnosis not present

## 2020-11-20 DIAGNOSIS — J45909 Unspecified asthma, uncomplicated: Secondary | ICD-10-CM | POA: Diagnosis not present

## 2020-11-20 DIAGNOSIS — U071 COVID-19: Secondary | ICD-10-CM | POA: Diagnosis not present

## 2020-11-20 DIAGNOSIS — I10 Essential (primary) hypertension: Secondary | ICD-10-CM | POA: Diagnosis not present

## 2020-11-20 DIAGNOSIS — T7840XA Allergy, unspecified, initial encounter: Secondary | ICD-10-CM | POA: Diagnosis not present

## 2020-11-20 DIAGNOSIS — Z79899 Other long term (current) drug therapy: Secondary | ICD-10-CM | POA: Diagnosis not present

## 2020-11-20 DIAGNOSIS — R0602 Shortness of breath: Secondary | ICD-10-CM | POA: Diagnosis not present

## 2020-11-20 DIAGNOSIS — R21 Rash and other nonspecific skin eruption: Secondary | ICD-10-CM | POA: Diagnosis not present

## 2020-11-26 DIAGNOSIS — G4733 Obstructive sleep apnea (adult) (pediatric): Secondary | ICD-10-CM | POA: Diagnosis not present

## 2020-12-16 ENCOUNTER — Other Ambulatory Visit: Payer: Self-pay | Admitting: Legal Medicine

## 2020-12-16 DIAGNOSIS — K2270 Barrett's esophagus without dysplasia: Secondary | ICD-10-CM

## 2020-12-27 DIAGNOSIS — G4733 Obstructive sleep apnea (adult) (pediatric): Secondary | ICD-10-CM | POA: Diagnosis not present

## 2020-12-31 ENCOUNTER — Other Ambulatory Visit: Payer: Self-pay

## 2020-12-31 MED ORDER — QUINAPRIL HCL 20 MG PO TABS: 20.0000 mg | ORAL_TABLET | Freq: Two times a day (BID) | ORAL | 0 refills | Status: DC

## 2021-01-03 ENCOUNTER — Other Ambulatory Visit: Payer: Self-pay | Admitting: Legal Medicine

## 2021-01-03 DIAGNOSIS — I10 Essential (primary) hypertension: Secondary | ICD-10-CM

## 2021-01-14 ENCOUNTER — Telehealth: Payer: Self-pay

## 2021-01-14 NOTE — Progress Notes (Signed)
    Chronic Care Management Pharmacy Assistant   Name: Laura [name]  MRN: [id number] DOB: 03-May-1966   Reason for Encounter: Medication Review for Ozempic PAP satus     Medications: Outpatient Encounter Medications as of 01/14/2021  Medication Sig  . albuterol (VENTOLIN HFA) 108 (90 Base) MCG/ACT inhaler Inhale 2 puffs into the lungs every 4 (four) hours as needed for wheezing or shortness of breath.  . ALPRAZolam (XANAX) 1 MG tablet Take 1 mg by mouth 4 (four) times daily.   Marland Kitchen amphetamine-dextroamphetamine (ADDERALL) 30 MG tablet Take 30 mg by mouth 3 (three) times daily.   . benzonatate (TESSALON) 100 MG capsule Take 1 capsule (100 mg total) by mouth 2 (two) times daily as needed for cough.  . benzonatate (TESSALON) 100 MG capsule Take 1 capsule (100 mg total) by mouth 2 (two) times daily as needed for cough.  . celecoxib (CELEBREX) 200 MG capsule Celebrex 200 mg capsule  Take 1 capsule every day by oral route with meals.  . cloNIDine (CATAPRES) 0.1 MG tablet   . EPINEPHrine 0.3 mg/0.3 mL IJ SOAJ injection Inject 0.3 mLs (0.3 mg total) into the muscle as needed for anaphylaxis.  . Evolocumab (REPATHA SURECLICK) 038 MG/ML SOAJ Inject 1 Dose into the skin every 14 (fourteen) days.  . furosemide (LASIX) 40 MG tablet Take 40 mg by mouth daily.   . hydrochlorothiazide (HYDRODIURIL) 25 MG tablet TAKE 1 TABLET(25 MG) BY MOUTH TWICE DAILY  . HYDROmorphone (DILAUDID) 2 MG tablet Take 0.5 tablets (1 mg total) by mouth every 4 (four) hours as needed for severe pain.  . hydrOXYzine (VISTARIL) 100 MG capsule Take 1 capsule (100 mg total) by mouth 3 (three) times daily as needed for itching.  . levothyroxine (SYNTHROID) 112 MCG tablet Take 1 tablet (112 mcg total) by mouth daily.  . magnesium oxide (MAG-OX) 400 MG tablet Take 1 tablet (400 mg total) by mouth daily.  Marland Kitchen mexiletine (MEXITIL) 150 MG capsule   . pantoprazole (PROTONIX) 40 MG tablet Take 1 tablet (40 mg total) by mouth daily. Taking  three times weekly  . Potassium Chloride CRYS   . potassium chloride SA (KLOR-CON) 20 MEQ tablet TAKE 1 TABLET BY MOUTH  DAILY  . quinapril (ACCUPRIL) 20 MG tablet Take 1 tablet (20 mg total) by mouth 2 (two) times daily.  . Semaglutide,0.25 or 0.5MG /DOS, (OZEMPIC, 0.25 OR 0.5 MG/DOSE,) 2 MG/1.5ML SOPN Inject 0.5 mg into the skin once a week.  . tamsulosin (FLOMAX) 0.4 MG CAPS capsule Take 1 capsule (0.4 mg total) by mouth daily after supper.  Marland Kitchen tiZANidine (ZANAFLEX) 4 MG tablet Take 4 mg by mouth 3 (three) times daily as needed.   . triamcinolone cream (KENALOG) 0.1 % Apply 1 application topically 2 (two) times daily.  . valACYclovir (VALTREX) 1000 MG tablet Take 1,000 mg by mouth 2 (two) times daily as needed.   . zolpidem (AMBIEN) 10 MG tablet Take 10 mg by mouth at bedtime.   No facility-administered encounter medications on file as of 01/14/2021.   Called about patients Ozempic status.  Per representative her application has been approved thru 10/31/21.  The patients medication should arrive at the office in 10-14 business days.  I have notified Laura [name], CPP, to expect the shipment.  I have notified the patient of her approval.  Laura [name], [place] Pharmacist Assistant [phone]

## 2021-01-19 ENCOUNTER — Telehealth: Payer: Self-pay

## 2021-01-19 NOTE — Progress Notes (Signed)
    Chronic Care Management Pharmacy Assistant   Name: JAZZLYNN RAWE  MRN: [id number] DOB: [birth year]  Reason for Encounter: Disease State-General Adherence    Recent office visits:  11/12/20 OV with PCP    Recent consult visits:  None   Hospital visits:  None in previous 6 months  Medications: Outpatient Encounter Medications as of 01/19/2021  Medication Sig  . albuterol (VENTOLIN HFA) 108 (90 Base) MCG/ACT inhaler Inhale 2 puffs into the lungs every 4 (four) hours as needed for wheezing or shortness of breath.  . ALPRAZolam (XANAX) 1 MG tablet Take 1 mg by mouth 4 (four) times daily.   Marland Kitchen amphetamine-dextroamphetamine (ADDERALL) 30 MG tablet Take 30 mg by mouth 3 (three) times daily.   . benzonatate (TESSALON) 100 MG capsule Take 1 capsule (100 mg total) by mouth 2 (two) times daily as needed for cough.  . benzonatate (TESSALON) 100 MG capsule Take 1 capsule (100 mg total) by mouth 2 (two) times daily as needed for cough.  . celecoxib (CELEBREX) 200 MG capsule Celebrex 200 mg capsule  Take 1 capsule every day by oral route with meals.  . cloNIDine (CATAPRES) 0.1 MG tablet   . EPINEPHrine 0.3 mg/0.3 mL IJ SOAJ injection Inject 0.3 mLs (0.3 mg total) into the muscle as needed for anaphylaxis.  . Evolocumab (REPATHA SURECLICK) 683 MG/ML SOAJ Inject 1 Dose into the skin every 14 (fourteen) days.  . furosemide (LASIX) 40 MG tablet Take 40 mg by mouth daily.   . hydrochlorothiazide (HYDRODIURIL) 25 MG tablet TAKE 1 TABLET(25 MG) BY MOUTH TWICE DAILY  . HYDROmorphone (DILAUDID) 2 MG tablet Take 0.5 tablets (1 mg total) by mouth every 4 (four) hours as needed for severe pain.  . hydrOXYzine (VISTARIL) 100 MG capsule Take 1 capsule (100 mg total) by mouth 3 (three) times daily as needed for itching.  . levothyroxine (SYNTHROID) 112 MCG tablet Take 1 tablet (112 mcg total) by mouth daily.  . magnesium oxide (MAG-OX) 400 MG tablet Take 1 tablet (400 mg total) by mouth daily.  Marland Kitchen mexiletine  (MEXITIL) 150 MG capsule   . pantoprazole (PROTONIX) 40 MG tablet Take 1 tablet (40 mg total) by mouth daily. Taking three times weekly  . Potassium Chloride CRYS   . potassium chloride SA (KLOR-CON) 20 MEQ tablet TAKE 1 TABLET BY MOUTH  DAILY  . quinapril (ACCUPRIL) 20 MG tablet Take 1 tablet (20 mg total) by mouth 2 (two) times daily.  . Semaglutide,0.25 or 0.5MG /DOS, (OZEMPIC, 0.25 OR 0.5 MG/DOSE,) 2 MG/1.5ML SOPN Inject 0.5 mg into the skin once a week.  . tamsulosin (FLOMAX) 0.4 MG CAPS capsule Take 1 capsule (0.4 mg total) by mouth daily after supper.  Marland Kitchen tiZANidine (ZANAFLEX) 4 MG tablet Take 4 mg by mouth 3 (three) times daily as needed.   . triamcinolone cream (KENALOG) 0.1 % Apply 1 application topically 2 (two) times daily.  . valACYclovir (VALTREX) 1000 MG tablet Take 1,000 mg by mouth 2 (two) times daily as needed.   . zolpidem (AMBIEN) 10 MG tablet Take 10 mg by mouth at bedtime.   No facility-administered encounter medications on file as of 01/19/2021.   Unable to reach patient  Clarita Leber, [place] Pharmacist Assistant [phone]

## 2021-01-22 ENCOUNTER — Telehealth: Payer: Self-pay

## 2021-01-22 NOTE — Progress Notes (Signed)
Chronic Care Management Pharmacy Assistant   Name: Laura [name]  MRN: [id number] DOB: 1966-06-24   Reason for Encounter: Adherence reveiw    Medications: Outpatient Encounter Medications as of 01/22/2021  Medication Sig  . albuterol (VENTOLIN HFA) 108 (90 Base) MCG/ACT inhaler Inhale 2 puffs into the lungs every 4 (four) hours as needed for wheezing or shortness of breath.  . ALPRAZolam (XANAX) 1 MG tablet Take 1 mg by mouth 4 (four) times daily.   Marland Kitchen amphetamine-dextroamphetamine (ADDERALL) 30 MG tablet Take 30 mg by mouth 3 (three) times daily.   . benzonatate (TESSALON) 100 MG capsule Take 1 capsule (100 mg total) by mouth 2 (two) times daily as needed for cough.  . benzonatate (TESSALON) 100 MG capsule Take 1 capsule (100 mg total) by mouth 2 (two) times daily as needed for cough.  . celecoxib (CELEBREX) 200 MG capsule Celebrex 200 mg capsule  Take 1 capsule every day by oral route with meals.  . cloNIDine (CATAPRES) 0.1 MG tablet   . EPINEPHrine 0.3 mg/0.3 mL IJ SOAJ injection Inject 0.3 mLs (0.3 mg total) into the muscle as needed for anaphylaxis.  . Evolocumab (REPATHA SURECLICK) 235 MG/ML SOAJ Inject 1 Dose into the skin every 14 (fourteen) days.  . furosemide (LASIX) 40 MG tablet Take 40 mg by mouth daily.   . hydrochlorothiazide (HYDRODIURIL) 25 MG tablet TAKE 1 TABLET(25 MG) BY MOUTH TWICE DAILY  . HYDROmorphone (DILAUDID) 2 MG tablet Take 0.5 tablets (1 mg total) by mouth every 4 (four) hours as needed for severe pain.  . hydrOXYzine (VISTARIL) 100 MG capsule Take 1 capsule (100 mg total) by mouth 3 (three) times daily as needed for itching.  . levothyroxine (SYNTHROID) 112 MCG tablet Take 1 tablet (112 mcg total) by mouth daily.  . magnesium oxide (MAG-OX) 400 MG tablet Take 1 tablet (400 mg total) by mouth daily.  Marland Kitchen mexiletine (MEXITIL) 150 MG capsule   . pantoprazole (PROTONIX) 40 MG tablet Take 1 tablet (40 mg total) by mouth daily. Taking three times weekly  .  Potassium Chloride CRYS   . potassium chloride SA (KLOR-CON) 20 MEQ tablet TAKE 1 TABLET BY MOUTH  DAILY  . quinapril (ACCUPRIL) 20 MG tablet Take 1 tablet (20 mg total) by mouth 2 (two) times daily.  . Semaglutide,0.25 or 0.5MG/DOS, (OZEMPIC, 0.25 OR 0.5 MG/DOSE,) 2 MG/1.5ML SOPN Inject 0.5 mg into the skin once a week.  . tamsulosin (FLOMAX) 0.4 MG CAPS capsule Take 1 capsule (0.4 mg total) by mouth daily after supper.  Marland Kitchen tiZANidine (ZANAFLEX) 4 MG tablet Take 4 mg by mouth 3 (three) times daily as needed.   . triamcinolone cream (KENALOG) 0.1 % Apply 1 application topically 2 (two) times daily.  . valACYclovir (VALTREX) 1000 MG tablet Take 1,000 mg by mouth 2 (two) times daily as needed.   . zolpidem (AMBIEN) 10 MG tablet Take 10 mg by mouth at bedtime.   No facility-administered encounter medications on file as of 01/22/2021.    Chart review noted the patients next appointment is 02/02/21-Sara Brown, CPP  Patient last saw Dr. Henrene Pastor PCP, on 11/12/20, the patient was positive for Covid.  Care Gaps MAMMOGRAM  Overdue since 08/07/2020 (Yearly)   Previous Completions  08/08/2019  Done   08/08/2019  HM MAMMOGRAPHY   05/25/2016  MM SCREENING BREAST TOMO BILATERAL     Adherence gaps Total Gaps - All Measures 2  Total Gaps - Star Measures 0 ? Breast Cancer Screening (BCS) Met:  Breast Cancer Screening Performed: Normal Result ? Controlling High Blood Pressure (CBP) 0 ? Diabetes: HbA1c Control <= 9.0% (CDC-A1c) 0 ? Diabetes: Eye exam (retinal) performed (CDC-Eye) 0 ? Diabetes: Medical attention for nephropathy (CDC-Neph) 0 ? Care for Older Adults (COA): Medication Review 0 ? Care for Older Adults (COA): Pain Assessment 0 ? Colorectal Cancer Screening (COL) Met: Colorectal Cancer Screening Compliant ? Medication Adherence for Cholesterol (Statins) (MAC) 0 ? Medication Adherence for Diabetes Medications (MAD) 0 ? Medication Adherence for Hypertension (RAS antogoniststs) [facility]) Met: Med  Compliance HTN:  100% ? Medication Reconciliation Post-Discharge (MRP) 0 ? Osteoporosis Management in Women Who Had a Fracture (OMW) 0 ? Plan All-Cause Readmissions (PCR) 0 ? Statin Therapy for Patients with Cardiovascular Disease (SPC) 0 ? Statin Use in Persons with Diabetes (SUPD) 0 ? Statin Use in Persons with Diabetes (SUPD) 0 Visits: Wellness Bundle [facility]) Not Met: AWV not performed Visits: Annual Wellness Visit (AWV) Not Met: AWV not performed Controlling High Blood Pressure (CBP) Met: BP < 140/90 Diabetes: HbA1c Testing (CDC-A1c Testing) 0 Diabetes: Eye exam (retinal) performed (CDC-Eye) 0 Care for Older Adults (COA): Medication Review 0 Care for Older Adults (COA): Pain Assessment 0 Plan All-Cause Readmissions (PCR) [place], [place] Pharmacist Assistant [phone]

## 2021-01-24 DIAGNOSIS — G4733 Obstructive sleep apnea (adult) (pediatric): Secondary | ICD-10-CM | POA: Diagnosis not present

## 2021-01-26 ENCOUNTER — Telehealth: Payer: Self-pay

## 2021-01-26 NOTE — Progress Notes (Signed)
Chronic Care Management Pharmacy Assistant   Name: Laura [name]  MRN: [id number] DOB: 01-20-1966    Reason for Encounter: Medication Review for Ozempic    Medications: Outpatient Encounter Medications as of 01/26/2021  Medication Sig  . albuterol (VENTOLIN HFA) 108 (90 Base) MCG/ACT inhaler Inhale 2 puffs into the lungs every 4 (four) hours as needed for wheezing or shortness of breath.  . ALPRAZolam (XANAX) 1 MG tablet Take 1 mg by mouth 4 (four) times daily.   Marland Kitchen amphetamine-dextroamphetamine (ADDERALL) 30 MG tablet Take 30 mg by mouth 3 (three) times daily.   . benzonatate (TESSALON) 100 MG capsule Take 1 capsule (100 mg total) by mouth 2 (two) times daily as needed for cough.  . benzonatate (TESSALON) 100 MG capsule Take 1 capsule (100 mg total) by mouth 2 (two) times daily as needed for cough.  . celecoxib (CELEBREX) 200 MG capsule Celebrex 200 mg capsule  Take 1 capsule every day by oral route with meals.  . cloNIDine (CATAPRES) 0.1 MG tablet   . EPINEPHrine 0.3 mg/0.3 mL IJ SOAJ injection Inject 0.3 mLs (0.3 mg total) into the muscle as needed for anaphylaxis.  . Evolocumab (REPATHA SURECLICK) 778 MG/ML SOAJ Inject 1 Dose into the skin every 14 (fourteen) days.  . furosemide (LASIX) 40 MG tablet Take 40 mg by mouth daily.   . hydrochlorothiazide (HYDRODIURIL) 25 MG tablet TAKE 1 TABLET(25 MG) BY MOUTH TWICE DAILY  . HYDROmorphone (DILAUDID) 2 MG tablet Take 0.5 tablets (1 mg total) by mouth every 4 (four) hours as needed for severe pain.  . hydrOXYzine (VISTARIL) 100 MG capsule Take 1 capsule (100 mg total) by mouth 3 (three) times daily as needed for itching.  . levothyroxine (SYNTHROID) 112 MCG tablet Take 1 tablet (112 mcg total) by mouth daily.  . magnesium oxide (MAG-OX) 400 MG tablet Take 1 tablet (400 mg total) by mouth daily.  Marland Kitchen mexiletine (MEXITIL) 150 MG capsule   . pantoprazole (PROTONIX) 40 MG tablet Take 1 tablet (40 mg total) by mouth daily. Taking three times  weekly  . Potassium Chloride CRYS   . potassium chloride SA (KLOR-CON) 20 MEQ tablet TAKE 1 TABLET BY MOUTH  DAILY  . quinapril (ACCUPRIL) 20 MG tablet Take 1 tablet (20 mg total) by mouth 2 (two) times daily.  . Semaglutide,0.25 or 0.5MG /DOS, (OZEMPIC, 0.25 OR 0.5 MG/DOSE,) 2 MG/1.5ML SOPN Inject 0.5 mg into the skin once a week.  . tamsulosin (FLOMAX) 0.4 MG CAPS capsule Take 1 capsule (0.4 mg total) by mouth daily after supper.  Marland Kitchen tiZANidine (ZANAFLEX) 4 MG tablet Take 4 mg by mouth 3 (three) times daily as needed.   . triamcinolone cream (KENALOG) 0.1 % Apply 1 application topically 2 (two) times daily.  . valACYclovir (VALTREX) 1000 MG tablet Take 1,000 mg by mouth 2 (two) times daily as needed.   . zolpidem (AMBIEN) 10 MG tablet Take 10 mg by mouth at bedtime.   No facility-administered encounter medications on file as of 01/26/2021.    I contacted [facility] to check the status of her Ozempic. The medication was in process still. An emergency voucher was given.    Donette Larry, CPP said it had not arrived in the office.  I was able to get a voucher for the Ozempic.  I have given that information to [facility] to process  I have called the patient and left a message that I was able to get her a voucher and medication should arrive  tomorrow.  Laura [name], [place] Pharmacist Assistant [phone]

## 2021-01-27 ENCOUNTER — Other Ambulatory Visit: Payer: Self-pay

## 2021-01-27 DIAGNOSIS — E8881 Metabolic syndrome: Secondary | ICD-10-CM

## 2021-01-27 MED ORDER — OZEMPIC (0.25 OR 0.5 MG/DOSE) 2 MG/1.5ML ~~LOC~~ SOPN: 0.5000 mg | PEN_INJECTOR | SUBCUTANEOUS | 0 refills | Status: DC

## 2021-02-02 ENCOUNTER — Ambulatory Visit (INDEPENDENT_AMBULATORY_CARE_PROVIDER_SITE_OTHER): Payer: Medicare Other

## 2021-02-02 ENCOUNTER — Other Ambulatory Visit: Payer: Self-pay

## 2021-02-02 DIAGNOSIS — R7303 Prediabetes: Secondary | ICD-10-CM

## 2021-02-02 DIAGNOSIS — I10 Essential (primary) hypertension: Secondary | ICD-10-CM

## 2021-02-02 DIAGNOSIS — E782 Mixed hyperlipidemia: Secondary | ICD-10-CM | POA: Diagnosis not present

## 2021-02-02 NOTE — Progress Notes (Signed)
Chronic Care Management Pharmacy Note  02/02/2021 Name:  Laura [name] MRN:  [id number] DOB:  03/15/1966   Plan Recommendations:   Patient reports symptoms of fatigue, tingling sensation, history of b-12 deficiency history of low magnesium, history of low vitamin D. She would like to have these items checked with upcoming labs during fasting appointment 02/03/2021.   Patient has had continual headache since COVID. She is still experiencing fatigue after COVID.   Patient has many intolerances to medications and OTC supplements. She has had allergy testing many years ago but is curious if she should consider seeing an allergist or consider allergy testing.   Patient would like to know if you recommend third COVID vaccine. Patient was treated inpatient with steroids.   Subjective: Laura [name] is an 55 y.o. year old female who is a primary patient of Henrene Pastor, Zeb Comfort, MD.  The CCM team was consulted for assistance with disease management and care coordination needs.    Engaged with patient by telephone for follow up visit in response to provider referral for pharmacy case management and/or care coordination services.   Consent to Services:  The patient was given information about Chronic Care Management services, agreed to services, and gave verbal consent prior to initiation of services.  Please see initial visit note for detailed documentation.   Patient Care Team: Lillard Anes, MD as PCP - General (Family Medicine) Wellington Hampshire, MD as PCP - Cardiology (Cardiology) Sueanne Margarita, MD as PCP - Sleep Medicine (Cardiology) Gatha Mayer, MD as Consulting Physician (Gastroenterology) Murlean Iba, MD (Internal Medicine) Burnice Logan, [facility] as Pharmacist (Pharmacist)  Recent office visits: 11/12/2020 - COVID positive.    Recent consult visits: None in chart since last ccm visit  Hospital visits: None in previous 6 months  Objective:  Lab Results   Component Value Date   CREATININE 1.0 07/24/2020   BUN 23 (A) 07/24/2020   GFRNONAA 75 05/19/2020   GFRAA 86 05/19/2020   NA 142 05/19/2020   K 4.2 05/19/2020   CALCIUM 9.8 05/19/2020   CO2 26 05/19/2020   GLUCOSE 89 05/19/2020    Lab Results  Component Value Date/Time   HGBA1C 6.0 (H) 04/15/2020 01:41 PM   HGBA1C 6.0 (H) 06/20/2019 09:08 AM    Last diabetic Eye exam: No results found for: HMDIABEYEEXA  Last diabetic Foot exam: No results found for: HMDIABFOOTEX   Lab Results  Component Value Date   CHOL 176 05/19/2020   HDL 53 05/19/2020   LDLCALC 73 05/19/2020   LDLDIRECT 222 (H) 04/26/2019   TRIG 315 (H) 05/19/2020   CHOLHDL 3.3 05/19/2020    Hepatic Function Latest Ref Rng & Units 05/19/2020 04/15/2020 11/07/2019  Total Protein 6.0 - 8.5 g/dL 7.1 7.2 7.5  Albumin 3.8 - 4.9 g/dL 4.4 4.7 4.2  AST 0 - 40 IU/L _0 ALT 0 - 32 IU/L _1 Alk Phosphatase 48 - 121 IU/L 73 72 66  Total Bilirubin 0.0 - 1.2 mg/dL 0.2 0.3 0.7  Bilirubin, Direct 0.0 - 0.3 mg/dL - - -    Lab Results  Component Value Date/Time   TSH 0.697 06/30/2020 03:21 PM   TSH 1.060 05/19/2020 02:54 PM   FREET4 1.37 12/16/2015 04:32 PM   FREET4 1.12 11/24/2015 08:26 AM    CBC Latest Ref Rng & Units 05/19/2020 04/15/2020 01/16/2020  WBC 3.4 - 10.8 x10E3/uL 6.9 6.5 6.9  Hemoglobin 11.1 - 15.9 g/dL 13.2 13.6  14.9  Hematocrit 34.0 - 46.6 % 39.1 42.8 45.5  Platelets 150 - 450 x10E3/uL 408 387 362    Lab Results  Component Value Date/Time   VD25OH 42 06/20/2019 09:08 AM   VD25OH 37 07/10/2018 04:03 PM    Clinical ASCVD: No  The 10-year ASCVD risk score Mikey Bussing DC Jr., et al., 2013) is: 3.1%   Values used to calculate the score:     Age: 61 years     Sex: Female     Is Non-Hispanic African American: No     Diabetic: No     Tobacco smoker: No     Systolic Blood Pressure: 583 mmHg     Is BP treated: Yes     HDL Cholesterol: 53 mg/dL     Total Cholesterol: 176 mg/dL    Depression screen [facility]  2/9 06/20/2019 12/05/2018 10/13/2018  Decreased Interest 2 0 0  Down, Depressed, Hopeless 1 0 1  PHQ - 2 Score 3 0 1  Altered sleeping 3 0 3  Tired, decreased energy 2 0 3  Change in appetite 2 0 0  Feeling bad or failure about yourself  1 0 1  Trouble concentrating 1 0 0  Moving slowly or fidgety/restless 1 0 0  Suicidal thoughts 0 0 0  PHQ-9 Score 13 0 8  Difficult doing work/chores Somewhat difficult Not difficult at all Somewhat difficult  Some recent data might be hidden     Social History   Tobacco Use  Smoking Status Former Smoker  . Quit date: 04/08/1995  . Years since quitting: 25.8  Smokeless Tobacco Never Used   BP Readings from Last 3 Encounters:  08/14/20 125/83  07/31/20 114/68  07/30/20 (!) 151/88   Pulse Readings from Last 3 Encounters:  08/14/20 62  07/31/20 60  07/30/20 67   Wt Readings from Last 3 Encounters:  11/12/20 157 lb 6.4 oz (71.4 kg)  08/14/20 178 lb (80.7 kg)  07/30/20 181 lb (82.1 kg)   BMI Readings from Last 3 Encounters:  11/12/20 26.19 kg/m  08/14/20 30.55 kg/m  07/30/20 31.07 kg/m    Assessment/Interventions: Review of patient past medical history, allergies, medications, health status, including review of consultants reports, laboratory and other test data, was performed as part of comprehensive evaluation and provision of chronic care management services.   SDOH:  (Social Determinants of Health) assessments and interventions performed: Yes SDOH Interventions   Flowsheet Row Most Recent Value  SDOH Interventions   Food Insecurity Interventions Other (Comment)  [Patient is aware of [place] for food options. Patient is not eligible for meals on wheels based on age. Pharmacist working to find local option to help with food insecurity.]     SDOH Screenings   Alcohol Screen: Not on file  Depression (ENM0-7): Not on file  Financial Resource Strain: Not on file  Food Insecurity: Food Insecurity Present  . Worried About Sales executive in the Last Year: Often true  . Ran Out of Food in the Last Year: Often true  Housing: Not on file  Physical Activity: Not on file  Social Connections: Not on file  Stress: Not on file  Tobacco Use: Medium Risk  . Smoking Tobacco Use: Former Smoker  . Smokeless Tobacco Use: Never Used  Transportation Needs: No Transportation Needs  . Lack of Transportation (Medical): No  . Lack of Transportation (Non-Medical): No    CCM Care Plan  Allergies  Allergen Reactions  . Meperidine Hives  . Shellfish Allergy  Shortness Of Breath and Swelling  . Diltiazem Swelling  . Acebutolol Swelling and Other (See Comments)  . Amlodipine     Swelling   . Celebrex [Celecoxib] Swelling    Patient began taking for knee pain and started swelling (hands, feet, face).   . Codeine Hives and Nausea And Vomiting  . Dexlansoprazole Other (See Comments)    Abdominal pain  . Dronedarone Swelling and Other (See Comments)  . Duloxetine Hcl Other (See Comments)    Made pt feel crazy  . Flecainide Swelling  . Gabapentin Other (See Comments)    Makes her feel crazy   . Hydrocodone Other (See Comments)    Keeps patient awake.  . Losartan     Swelling   . Metoprolol Swelling  . Mexiletine     Swelling - hands, legs, face  . Mirtazapine Swelling  . Omeprazole Other (See Comments)    Abdominal pain  . Oxycodone Itching  . Sectral [Acebutolol Hcl] Swelling  . Toradol [Ketorolac Tromethamine] Itching  . Tramadol     Unable to sleep, makes her itch    Medications Reviewed Today    Reviewed by Burnice Logan, [facility] (Pharmacist) on 02/02/21 at 1443  Med List Status: <None>  Medication Order Taking? Sig Documenting Provider Last Dose Status Informant  albuterol (VENTOLIN HFA) 108 (90 Base) MCG/ACT inhaler [id number] Yes Inhale 2 puffs into the lungs every 4 (four) hours as needed for wheezing or shortness of breath. Delsa Grana, PA-C Taking Active Self  ALPRAZolam Duanne Moron) 1 MG tablet [id number] Yes Take 1  mg by mouth 4 (four) times daily.  [provider] Taking Active Self           Med Note Rosemarie Beath, MELISSA B   Wed Apr 06, 2017  5:57 PM)    amphetamine-dextroamphetamine (ADDERALL) 30 MG tablet [id number] Yes Take 30 mg by mouth 3 (three) times daily.  [provider] Taking Active Self           Med Note Cyndi Bender, EMILY R   Wed Oct 03, 2019  9:49 AM)    benzonatate (TESSALON) 100 MG capsule [id number] No Take 1 capsule (100 mg total) by mouth 2 (two) times daily as needed for cough.  Patient not taking: Reported on 02/02/2021   Lillard Anes, MD Not Taking Active   benzonatate (TESSALON) 100 MG capsule [id number] No Take 1 capsule (100 mg total) by mouth 2 (two) times daily as needed for cough.  Patient not taking: Reported on 02/02/2021   Lillard Anes, MD Not Taking Active   cloNIDine (CATAPRES) 0.1 MG tablet [id number] No   Patient not taking: Reported on 02/02/2021   [provider] Not Taking Active   EPINEPHrine 0.3 mg/0.3 mL IJ SOAJ injection [id number] Yes Inject 0.3 mLs (0.3 mg total) into the muscle as needed for anaphylaxis. Lillard Anes, MD Taking Active   Evolocumab [facility] SURECLICK) 861 MG/ML Darden Palmer [id number] No Inject 1 Dose into the skin every 14 (fourteen) days.  Patient not taking: Reported on 02/02/2021   Pixie Casino, MD Not Taking Active   furosemide (LASIX) 40 MG tablet [id number] Yes Take 40 mg by mouth 2 (two) times daily. [provider] Taking Active Self  hydrochlorothiazide (HYDRODIURIL) 25 MG tablet [id number] Yes TAKE 1 TABLET(25 MG) BY MOUTH TWICE DAILY  Patient taking differently: Take 25 mg by mouth daily as needed. TAKE 1 TABLET(25 MG) BY MOUTH TWICE DAILY   Deboraha Sprang, MD Taking Active  HYDROmorphone (DILAUDID) 2 MG tablet [id number] No Take 0.5 tablets (1 mg total) by mouth every 4 (four) hours as needed for severe pain.  Patient not taking: Reported on 02/02/2021   Lillard Anes, MD Not Taking  Active   hydrOXYzine (VISTARIL) 100 MG capsule [id number] Yes Take 1 capsule (100 mg total) by mouth 3 (three) times daily as needed for itching. Lillard Anes, MD Taking Active   levothyroxine (SYNTHROID) 112 MCG tablet [id number] Yes Take 1 tablet (112 mcg total) by mouth daily. Lillard Anes, MD Taking Active   magnesium oxide (MAG-OX) 400 MG tablet [id number] No Take 1 tablet (400 mg total) by mouth daily.  Patient not taking: Reported on 02/02/2021   Rise Mu, PA-C Not Taking Active Self  mexiletine (MEXITIL) 150 MG capsule [id number] No   Patient not taking: Reported on 02/02/2021   [provider] Not Taking Active   pantoprazole (PROTONIX) 40 MG tablet [id number] No Take 1 tablet (40 mg total) by mouth daily. Taking three times weekly  Patient not taking: Reported on 02/02/2021   Lillard Anes, MD Not Taking Active   Potassium Chloride CRYS [id number]   [provider]  Consider Medication Status and Discontinue (Duplicate)   potassium chloride SA (KLOR-CON) 20 MEQ tablet [id number] Yes TAKE 1 TABLET BY MOUTH  DAILY Lillard Anes, MD Taking Active   quinapril (ACCUPRIL) 20 MG tablet [id number] Yes Take 1 tablet (20 mg total) by mouth 2 (two) times daily. Wellington Hampshire, MD Taking Active   Semaglutide,0.25 or 0.5MG/DOS, (OZEMPIC, 0.25 OR 0.5 MG/DOSE,) 2 MG/1.5ML SOPN [id number] Yes Inject 0.5 mg into the skin once a week.  Patient taking differently: Inject 0.25 mg into the skin once a week.   Lillard Anes, MD Taking Active   tamsulosin [facility]) 0.4 MG CAPS capsule [id number] No Take 1 capsule (0.4 mg total) by mouth daily after supper.  Patient not taking: Reported on 02/02/2021   Billey Co, MD Not Taking Active   tiZANidine (ZANAFLEX) 4 MG tablet [id number] Yes Take 4 mg by mouth 3 (three) times daily as needed.  [provider] Taking Active Self           Med Note Nash Mantis, TIFFANI S   Sat Sep 01, 2019  7:14 AM)     triamcinolone cream (KENALOG) 0.1 % [id number] No Apply 1 application topically 2 (two) times daily.  Patient not taking: Reported on 02/02/2021   Lillard Anes, MD Not Taking Active   valACYclovir (VALTREX) 1000 MG tablet [id number] Yes Take 1,000 mg by mouth 2 (two) times daily as needed.  [provider] Taking Active   zolpidem (AMBIEN) 10 MG tablet [id number] Yes Take 10 mg by mouth at bedtime. [provider] Taking Active Self          Patient Active Problem List   Diagnosis Date Noted  . 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/12/2020  . Candida vaginitis 07/22/2020  . BMI 31.0-31.9,adult 05/19/2020  . Pain in joint of left shoulder 12/03/2019  . Acute GI bleeding 09/01/2019  . Hypercalcemia 06/20/2019  . Nasal septal perforation 05/12/2018  . Fatty liver 08/11/2017  . Sleep apnea 08/11/2017  . Renal atrophy, right 07/18/2017  . Encounter for weight loss counseling 05/13/2017  . 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 Recurrent UTI 05/10/2017  . Status post hysterectomy 03/08/2017  . Barrett's esophagus 11/02/2016  . Difficulty with speech 10/18/2016  . Memory impairment 10/18/2016  . Chronic pain syndrome  10/12/2016  . Chronic low back pain (Location of Tertiary source of pain) (Bilateral) (L>R) 10/12/2016  . Lumbar facet joint syndrome 10/12/2016  . Chronic sacroiliac joint pain 10/12/2016  . Chronic shoulder pain (Location of Primary Source of Pain) (Bilateral) (R>L) 10/12/2016  . Chronic neck pain (Location of Secondary source of pain) (Bilateral) (R>L) 10/12/2016  . Chronic upper back pain (Bilateral) (L>R) 10/12/2016  . Chronic hand pain (Bilateral) (R>L) 10/12/2016  . Chronic hand pain (Right) 10/12/2016  . Chronic hand pain (Left) 10/12/2016  . Chronic knee pain (Right) 10/12/2016  . CKD (chronic kidney disease) 10/11/2016  . Vitamin B12 deficiency 07/26/2016  . Anxiety 07/06/2016  . Depression 07/06/2016  . Raynaud disease 05/06/2016   . Fibromyalgia 02/13/2016  . Prediabetes 12/23/2015  . Heart palpitations 12/16/2015  . Heart murmur 12/16/2015  . Mixed hyperlipidemia 11/19/2015  . Gastroesophageal reflux disease 06/13/2015  . Osteoarthrosis, generalized, multiple joints 06/06/2015  . Vaginal enterocele   . Urinary retention with incomplete bladder emptying   . Obesity, Class I, BMI 30.0-34.9 (see actual BMI)   . Rectocele   . Cystocele   . Hypothyroidism, unspecified 12/03/2014  . Essential hypertension 12/03/2014  . Vitamin D deficiency 12/03/2014  . Asthma in adult, mild persistent, uncomplicated 02/40/9735  . Insomnia 08/31/2013    Immunization History  Administered Date(s) Administered  . Influenza Inj Mdck Quad Pf 07/22/2020  . Influenza Split 08/15/2013  . Influenza,inj,Quad PF,6+ Mos 12/04/2014, 08/29/2015, 07/26/2016, 08/11/2017, 08/06/2019  . Influenza-Unspecified 12/05/2015  . PFIZER(Purple Top)SARS-COV-2 Vaccination 01/24/2020, 02/19/2020  . Pneumococcal Polysaccharide-23 12/18/2015  . Tdap 08/24/2013, 05/25/2016    Conditions to be addressed/monitored:  Hypertension, Hyperlipidemia and Diabetes  Care Plan : ccm pharmacy care plan  Updates made by Burnice Logan, [place] since 02/02/2021 12:00 AM    Problem: htn, hld, prediabetes, hypothyroid   Priority: High  Onset Date: 02/02/2021    Goal: Patient-Specific Goal   Start Date: 02/02/2021  Expected End Date: 02/02/2022  This Visit's Progress: On track  Priority: High  Note:      Current Barriers:  . Unable to independently afford treatment regimen  Pharmacist Clinical Goal(s):  Marland Kitchen Patient will verbalize ability to afford treatment regimen through collaboration with PharmD and provider.   Interventions: . 1:1 collaboration with Lillard Anes, MD regarding development and update of comprehensive plan of care as evidenced by provider attestation and co-signature . Inter-disciplinary care team collaboration (see longitudinal plan of  care) . Comprehensive medication review performed; medication list updated in electronic medical record  Hypertension (BP goal <140/90) -Controlled -Current treatment: . furosemide 40 mg twice daily  . Hydrochlorothiazide 25 mg daily prn . Quinapril 20 mg bid . Potassium 20 meq daily  -Medications previously tried: amlodipine, acebutolol -Current home readings: not reported -Current dietary habits: eating very healthy - low glycemic index -Current exercise habits: limited by knee and back pain  -Denies hypotensive/hypertensive symptoms -Educated on BP goals and benefits of medications for prevention of heart attack, stroke and kidney damage; Daily salt intake goal < 2300 mg; -Counseled to monitor BP at home as needed, document, and provide log at future appointments -Counseled on diet and exercise extensively Recommended to continue current medication  Hyperlipidemia: (LDL goal < 100) -Not ideally controlled -Current treatment: . Repatha 140 mg/ml every 14 days - not currently taking  -Medications previously tried: statins  -Current dietary patterns: very healthy diet -Current exercise habits: limited by back and knee pain  -Educated on Cholesterol goals;  Importance of  limiting foods high in cholesterol; Exercise goal of 150 minutes per week; -Counseled on diet and exercise extensively Counseled on resuming Repatha Collaborated with front desk to get patient scheduled for updated labs and chronic visit.   Prediabetes (A1c goal <6.5%) -Controlled -Current medications: . Ozempic 0.25 mg weekly  -Medications previously tried: not reproted  -Denies hypoglycemic/hyperglycemic symptoms -Current meal patterns:  . Next 56 day plan - low glycemic index. Minimally processed food.  -Current exercise: limited by knee and back pain  -Educated on A1c and blood sugar goals; -Counseled to check feet daily and get yearly eye exams -Counseled on diet and exercise extensively Recommended  to continue current medication   Patient Goals/Self-Care Activities . Patient will:  - take medications as prescribed engage in dietary modifications by continuing to eat lean protein, vegetables and low glycemic index fruits.   Follow Up Plan: Telephone follow up appointment with care management team member scheduled for: 04/2021      Medication Assistance: Ozempicobtained through [employer] medication assistance program.  Enrollment ends 09/30/2021  Patient's preferred pharmacy is:  [facility], [address], [address] [place], [place] [address] Phone: [phone] Fax: [phone]  [facility] [phone] - [place], [address] AT [address] [place] [address] Phone: [phone] Fax: [phone]  [facility] - [place], [place] - [address] [place] [address] Phone: [phone] Fax: [phone]  Uses pill box? Yes Pt endorses good compliance  We discussed: Current pharmacy is preferred with insurance plan and patient is satisfied with pharmacy services Patient decided to: Continue current medication management strategy  Care Plan and Follow Up Patient Decision:  Patient agrees to Care Plan and Follow-up.  Plan: Telephone follow up appointment with care management team member scheduled for:  04/20/2021

## 2021-02-02 NOTE — Patient Instructions (Addendum)
Visit Information  Goals Addressed            This Visit's Progress   . Learn More About My Health       Timeframe:  Long-Range Goal Priority:  High Start Date:                             Expected End Date:                        Follow Up Date 08/04/2021    - tell my story and reason for my visit - make a list of questions - bring a list of my medicines to the visit - speak up when I don't understand    Why is this important?    The best way to learn about your health and care is by talking to the doctor and nurse.   They will answer your questions and give you information in the way that you like best.    Notes:     . Lifestyle Change-Hypertension       Timeframe:  Long-Range Goal Priority:  High Start Date:                             Expected End Date:                       Follow Up Date 08/04/2021    - ask questions to understand    Why is this important?    The changes that you are asked to make may be hard to do.   This is especially true when the changes are life-long.   Knowing why it is important to you is the first step.   Working on the change with your family or support person helps you not feel alone.   Reward yourself and family or support person when goals are met. This can be an activity you choose like bowling, hiking, biking, swimming or shooting hoops.     Notes:     Marland Kitchen Manage My Medicine       Timeframe:  Long-Range Goal Priority:  High Start Date:                             Expected End Date:                       Follow Up Date 08/04/2021    - call for medicine refill 2 or 3 days before it runs out - keep a list of all the medicines I take; vitamins and herbals too - use a pillbox to sort medicine    Why is this important?   . These steps will help you keep on track with your medicines.   Notes:       Patient Care Plan: [facility] pharmacy care plan    Problem Identified: htn, hld, prediabetes, hypothyroid   Priority: High   Onset Date: 02/02/2021    Goal: Patient-Specific Goal   Start Date: 02/02/2021  Expected End Date: 02/02/2022  This Visit's Progress: On track  Priority: High  Note:      Current Barriers:  . Unable to independently afford treatment regimen  Pharmacist Clinical Goal(s):  Marland Kitchen Patient will verbalize ability to afford treatment regimen through collaboration with  PharmD and provider.   Interventions: . 1:1 collaboration with Lillard Anes, MD regarding development and update of comprehensive plan of care as evidenced by provider attestation and co-signature . Inter-disciplinary care team collaboration (see longitudinal plan of care) . Comprehensive medication review performed; medication list updated in electronic medical record  Hypertension (BP goal <140/90) -Controlled -Current treatment: . furosemide 40 mg twice daily  . Hydrochlorothiazide 25 mg daily prn . Quinapril 20 mg bid . Potassium 20 meq daily  -Medications previously tried: amlodipine, acebutolol -Current home readings: not reported -Current dietary habits: eating very healthy - low glycemic index -Current exercise habits: limited by knee and back pain  -Denies hypotensive/hypertensive symptoms -Educated on BP goals and benefits of medications for prevention of heart attack, stroke and kidney damage; Daily salt intake goal < 2300 mg; -Counseled to monitor BP at home as needed, document, and provide log at future appointments -Counseled on diet and exercise extensively Recommended to continue current medication  Hyperlipidemia: (LDL goal < 100) -Not ideally controlled -Current treatment: . Repatha 140 mg/ml every 14 days - not currently taking  -Medications previously tried: statins  -Current dietary patterns: very healthy diet -Current exercise habits: limited by back and knee pain  -Educated on Cholesterol goals;  Importance of limiting foods high in cholesterol; Exercise goal of 150 minutes per  week; -Counseled on diet and exercise extensively Counseled on resuming Repatha Collaborated with front desk to get patient scheduled for updated labs and chronic visit.   Prediabetes (A1c goal <6.5%) -Controlled -Current medications: . Ozempic 0.25 mg weekly  -Medications previously tried: not reproted  -Denies hypoglycemic/hyperglycemic symptoms -Current meal patterns:  . Next 56 day plan - low glycemic index. Minimally processed food.  -Current exercise: limited by knee and back pain  -Educated on A1c and blood sugar goals; -Counseled to check feet daily and get yearly eye exams -Counseled on diet and exercise extensively Recommended to continue current medication   Patient Goals/Self-Care Activities . Patient will:  - take medications as prescribed engage in dietary modifications by continuing to eat lean protein, vegetables and low glycemic index fruits.   Follow Up Plan: Telephone follow up appointment with care management team member scheduled for: 04/2021      The patient verbalized understanding of instructions, educational materials, and care plan provided today and declined offer to receive copy of patient instructions, educational materials, and care plan.  Telephone follow up appointment with pharmacy team member scheduled for: 04/20/2021  Burnice Logan, [facility]  Fat and Cholesterol Restricted Eating Plan Getting too much fat and cholesterol in your diet may cause health problems. Choosing the right foods helps keep your fat and cholesterol at normal levels. This can keep you from getting certain diseases. Your doctor may recommend an eating plan that includes:  Total fat: ______% or less of total calories a day.  Saturated fat: ______% or less of total calories a day.  Cholesterol: less than _________mg a day.  Fiber: ______g a day. What are tips for following this plan? Meal planning  At meals, divide your plate into four equal parts: ? Fill one-half of  your plate with vegetables and green salads. ? Fill one-fourth of your plate with whole grains. ? Fill one-fourth of your plate with low-fat (lean) protein foods.  Eat fish that is high in omega-3 fats at least two times a week. This includes mackerel, tuna, sardines, and salmon.  Eat foods that are high in fiber, such as whole grains, beans, apples,  broccoli, carrots, peas, and barley. General tips  Work with your doctor to lose weight if you need to.  Avoid: ? Foods with added sugar. ? Fried foods. ? Foods with partially hydrogenated oils.  Limit alcohol intake to no more than 1 drink a day for nonpregnant women and 2 drinks a day for men. One drink equals 12 oz of beer, 5 oz of wine, or 1 oz of hard liquor.   Reading food labels  Check food labels for: ? Trans fats. ? Partially hydrogenated oils. ? Saturated fat (g) in each serving. ? Cholesterol (mg) in each serving. ? Fiber (g) in each serving.  Choose foods with healthy fats, such as: ? Monounsaturated fats. ? Polyunsaturated fats. ? Omega-3 fats.  Choose grain products that have whole grains. Look for the word "whole" as the first word in the ingredient list. Cooking  Cook foods using low-fat methods. These include baking, boiling, grilling, and broiling.  Eat more home-cooked foods. Eat at restaurants and buffets less often.  Avoid cooking using saturated fats, such as butter, cream, palm oil, palm kernel oil, and coconut oil. Recommended foods Fruits  All fresh, canned (in natural juice), or frozen fruits. Vegetables  Fresh or frozen vegetables (raw, steamed, roasted, or grilled). Green salads. Grains  Whole grains, such as whole wheat or whole grain breads, crackers, cereals, and pasta. Unsweetened oatmeal, bulgur, barley, quinoa, or Ellsworth Waldschmidt rice. Corn or whole wheat flour tortillas. Meats and other protein foods  Ground beef (85% or leaner), grass-fed beef, or beef trimmed of fat. Skinless chicken or  [place]. Ground chicken or [place]. Pork trimmed of fat. All fish and seafood. Egg whites. Dried beans, peas, or lentils. Unsalted nuts or seeds. Unsalted canned beans. Nut butters without added sugar or oil. Dairy  Low-fat or nonfat dairy products, such as skim or 1% milk, 2% or reduced-fat cheeses, low-fat and fat-free ricotta or cottage cheese, or plain low-fat and nonfat yogurt. Fats and oils  Tub margarine without trans fats. Light or reduced-fat mayonnaise and salad dressings. Avocado. Olive, canola, sesame, or safflower oils. The items listed above may not be a complete list of foods and beverages you can eat. Contact a dietitian for more information.   Foods to avoid Fruits  Canned fruit in heavy syrup. Fruit in cream or butter sauce. Fried fruit. Vegetables  Vegetables cooked in cheese, cream, or butter sauce. Fried vegetables. Grains  White bread. White pasta. White rice. Cornbread. Bagels, pastries, and croissants. Crackers and snack foods that contain trans fat and hydrogenated oils. Meats and other protein foods  Fatty cuts of meat. Ribs, chicken wings, bacon, sausage, bologna, salami, chitterlings, fatback, hot dogs, bratwurst, and packaged lunch meats. Liver and organ meats. Whole eggs and egg yolks. Chicken and [place] with skin. Fried meat. Dairy  Whole or 2% milk, cream, half-and-half, and cream cheese. Whole milk cheeses. Whole-fat or sweetened yogurt. Full-fat cheeses. Nondairy creamers and whipped toppings. Processed cheese, cheese spreads, and cheese curds. Beverages  Alcohol. Sugar-sweetened drinks such as sodas, lemonade, and fruit drinks. Fats and oils  Butter, stick margarine, lard, shortening, ghee, or bacon fat. Coconut, palm kernel, and palm oils. Sweets and desserts  Corn syrup, sugars, honey, and molasses. Candy. Jam and jelly. Syrup. Sweetened cereals. Cookies, pies, cakes, donuts, muffins, and ice cream. The items listed above may not be a complete list  of foods and beverages you should avoid. Contact a dietitian for more information. Summary  Choosing the right foods helps keep your fat  and cholesterol at normal levels. This can keep you from getting certain diseases.  At meals, fill one-half of your plate with vegetables and green salads.  Eat high-fiber foods, like whole grains, beans, apples, carrots, peas, and barley.  Limit added sugar, saturated fats, alcohol, and fried foods. This information is not intended to replace advice given to you by your health care provider. Make sure you discuss any questions you have with your health care provider. Document Revised: 02/20/2020 Document Reviewed: 02/20/2020 Elsevier Patient Education  2021 [employer].

## 2021-02-03 ENCOUNTER — Ambulatory Visit (INDEPENDENT_AMBULATORY_CARE_PROVIDER_SITE_OTHER): Payer: Medicare Other | Admitting: Legal Medicine

## 2021-02-03 ENCOUNTER — Encounter: Payer: Self-pay | Admitting: Legal Medicine

## 2021-02-03 ENCOUNTER — Encounter: Payer: Self-pay | Admitting: Cardiovascular Disease

## 2021-02-03 ENCOUNTER — Ambulatory Visit: Payer: Medicare Other | Admitting: Cardiovascular Disease

## 2021-02-03 ENCOUNTER — Other Ambulatory Visit: Payer: Self-pay

## 2021-02-03 VITALS — BP 136/98 | HR 64 | Ht 64.0 in | Wt 163.0 lb

## 2021-02-03 VITALS — BP 122/68 | HR 88 | Temp 97.5°F | Resp 16 | Ht 64.0 in | Wt 164.0 lb

## 2021-02-03 DIAGNOSIS — I10 Essential (primary) hypertension: Secondary | ICD-10-CM

## 2021-02-03 DIAGNOSIS — M797 Fibromyalgia: Secondary | ICD-10-CM

## 2021-02-03 DIAGNOSIS — R0602 Shortness of breath: Secondary | ICD-10-CM

## 2021-02-03 DIAGNOSIS — J4530 Mild persistent asthma, uncomplicated: Secondary | ICD-10-CM

## 2021-02-03 DIAGNOSIS — R7303 Prediabetes: Secondary | ICD-10-CM | POA: Diagnosis not present

## 2021-02-03 DIAGNOSIS — G894 Chronic pain syndrome: Secondary | ICD-10-CM | POA: Diagnosis not present

## 2021-02-03 DIAGNOSIS — K2270 Barrett's esophagus without dysplasia: Secondary | ICD-10-CM | POA: Diagnosis not present

## 2021-02-03 DIAGNOSIS — I493 Ventricular premature depolarization: Secondary | ICD-10-CM

## 2021-02-03 DIAGNOSIS — E782 Mixed hyperlipidemia: Secondary | ICD-10-CM | POA: Diagnosis not present

## 2021-02-03 DIAGNOSIS — E785 Hyperlipidemia, unspecified: Secondary | ICD-10-CM

## 2021-02-03 DIAGNOSIS — E039 Hypothyroidism, unspecified: Secondary | ICD-10-CM | POA: Diagnosis not present

## 2021-02-03 DIAGNOSIS — E538 Deficiency of other specified B group vitamins: Secondary | ICD-10-CM

## 2021-02-03 DIAGNOSIS — G4730 Sleep apnea, unspecified: Secondary | ICD-10-CM | POA: Diagnosis not present

## 2021-02-03 NOTE — Patient Instructions (Signed)
Medication Instructions:  Your physician recommends that you continue on your current medications as directed. Please refer to the Current Medication list given to you today.  *If you need a refill on your cardiac medications before your next appointment, please call your pharmacy*   Lab Work: None ordered If you have labs (blood work) drawn today and your tests are completely normal, you will receive your results only by: . MyChart Message (if you have MyChart) OR . A paper copy in the mail If you have any lab test that is abnormal or we need to change your treatment, we will call you to review the results.   Testing/Procedures: Your physician has requested that you have an echocardiogram. Echocardiography is a painless test that uses sound waves to create images of your heart. It provides your doctor with information about the size and shape of your heart and how well your heart's chambers and valves are working. This procedure takes approximately one hour. There are no restrictions for this procedure.     Follow-Up: At [facility], you and your health needs are our priority.  As part of our continuing mission to provide you with exceptional heart care, we have created designated Provider Care Teams.  These Care Teams include your primary Cardiologist (physician) and Advanced Practice Providers (APPs -  Physician Assistants and Nurse Practitioners) who all work together to provide you with the care you need, when you need it.  We recommend signing up for the patient portal called "MyChart".  Sign up information is provided on this After Visit Summary.  MyChart is used to connect with patients for Virtual Visits (Telemedicine).  Patients are able to view lab/test results, encounter notes, upcoming appointments, etc.  Non-urgent messages can be sent to your provider as well.   To learn more about what you can do with MyChart, go to [email or web address].[email or web address].    Your next appointment:    Your physician wants you to follow-up in: 1 year You will receive a reminder letter in the mail two months in advance. If you don't receive a letter, please call our office to schedule the follow-up appointment.   The format for your next appointment:   In Person  Provider:   You may see Muhammad Arida, MD or one of the following Advanced Practice Providers on your designated Care Team:    Christopher Berge, NP  Ryan Dunn, PA-C  Jacquelyn Visser, PA-C  Cadence Furth, PA-C  Caitlin Walker, NP    Other Instructions N/A  

## 2021-02-03 NOTE — Patient Instructions (Signed)
Myofascial Pain Syndrome and Fibromyalgia Myofascial pain syndrome and fibromyalgia are both pain disorders. This pain may be felt mainly in your muscles.  Myofascial pain syndrome: ? Always has tender points in the muscle that will cause pain when pressed (trigger points). The pain may come and go. ? Usually affects your neck, upper back, and shoulder areas. The pain often radiates into your arms and hands.  Fibromyalgia: ? Has muscle pains and tenderness that come and go. ? Is often associated with fatigue and sleep problems. ? Has trigger points. ? Tends to be long-lasting (chronic), but is not life-threatening. Fibromyalgia and myofascial pain syndrome are not the same. However, they often occur together. If you have both conditions, each can make the other worse. Both are common and can cause enough pain and fatigue to make day-to-day activities difficult. Both can be hard to diagnose because their symptoms are common in many other conditions. What are the causes? The exact causes of these conditions are not known. What increases the risk? You are more likely to develop this condition if:  You have a family history of the condition.  You have certain triggers, such as: ? Spine disorders. ? An injury (trauma) or other physical stressors. ? Being under a lot of stress. ? Medical conditions such as osteoarthritis, rheumatoid arthritis, or lupus. What are the signs or symptoms? Fibromyalgia The main symptom of fibromyalgia is widespread pain and tenderness in your muscles. Pain is sometimes described as stabbing, shooting, or burning. You may also have:  Tingling or numbness.  Sleep problems and fatigue.  Problems with attention and concentration (fibro fog). Other symptoms may include:  Bowel and bladder problems.  Headaches.  Visual problems.  Problems with odors and noises.  Depression or mood changes.  Painful menstrual periods (dysmenorrhea).  Dry skin or  eyes. These symptoms can vary over time. Myofascial pain syndrome Symptoms of myofascial pain syndrome include:  Tight, ropy bands of muscle.  Uncomfortable sensations in muscle areas. These may include aching, cramping, burning, numbness, tingling, and weakness.  Difficulty moving certain parts of the body freely (poor range of motion). How is this diagnosed? This condition may be diagnosed by your symptoms and medical history. You will also have a physical exam. In general:  Fibromyalgia is diagnosed if you have pain, fatigue, and other symptoms for more than 3 months, and symptoms cannot be explained by another condition.  Myofascial pain syndrome is diagnosed if you have trigger points in your muscles, and those trigger points are tender and cause pain elsewhere in your body (referred pain). How is this treated? Treatment for these conditions depends on the type that you have.  For fibromyalgia: ? Pain medicines, such as NSAIDs. ? Medicines for treating depression. ? Medicines for treating seizures. ? Medicines that relax the muscles.  For myofascial pain: ? Pain medicines, such as NSAIDs. ? Cooling and stretching of muscles. ? Trigger point injections. ? Sound wave (ultrasound) treatments to stimulate muscles. Treating these conditions often requires a team of health care providers. These may include:  Your primary care provider.  Physical therapist.  Complementary health care providers, such as massage therapists or acupuncturists.  Psychiatrist for cognitive behavioral therapy.   Follow these instructions at home: Medicines  Take over-the-counter and prescription medicines only as told by your health care provider.  Do not drive or use heavy machinery while taking prescription pain medicine.  If you are taking prescription pain medicine, take actions to prevent or treat constipation. Your  health care provider may recommend that you: ? Drink enough fluid to keep  your urine pale yellow. ? Eat foods that are high in fiber, such as fresh fruits and vegetables, whole grains, and beans. ? Limit foods that are high in fat and processed sugars, such as fried or sweet foods. ? Take an over-the-counter or prescription medicine for constipation. Lifestyle  Exercise as directed by your health care provider or physical therapist.  Practice relaxation techniques to control your stress. You may want to try: ? Biofeedback. ? Visual imagery. ? Hypnosis. ? Muscle relaxation. ? Yoga. ? Meditation.  Maintain a healthy lifestyle. This includes eating a healthy diet and getting enough sleep.  Do not use any products that contain nicotine or tobacco, such as cigarettes and e-cigarettes. If you need help quitting, ask your health care provider.   General instructions  Talk to your health care provider about complementary treatments, such as acupuncture or massage.  Consider joining a support group with others who are diagnosed with this condition.  Do not do activities that stress or strain your muscles. This includes repetitive motions and heavy lifting.  Keep all follow-up visits as told by your health care provider. This is important. Where to find more information  National Fibromyalgia Association: www.fmaware.[place]: www.arthritis.org  American Chronic Pain Association: www.theacpa.org Contact a health care provider if:  You have new symptoms.  Your symptoms get worse or your pain is severe.  You have side effects from your medicines.  You have trouble sleeping.  Your condition is causing depression or anxiety. Summary  Myofascial pain syndrome and fibromyalgia are pain disorders.  Myofascial pain syndrome has tender points in the muscle that will cause pain when pressed (trigger points). Fibromyalgia also has muscle pains and tenderness that come and go, but this condition is often associated with fatigue and sleep  disturbances.  Fibromyalgia and myofascial pain syndrome are not the same but often occur together, causing pain and fatigue that make day-to-day activities difficult.  Treatment for fibromyalgia includes taking medicines to relax the muscles and medicines for pain, depression, or seizures. Treatment for myofascial pain syndrome includes taking medicines for pain, cooling and stretching of muscles, and injecting medicines into trigger points.  Follow your health care provider's instructions for taking medicines and maintaining a healthy lifestyle. This information is not intended to replace advice given to you by your health care provider. Make sure you discuss any questions you have with your health care provider. Document Revised: 02/09/2019 Document Reviewed: 11/02/2017 Elsevier Patient Education  2021 [employer].
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
## 2021-02-04 ENCOUNTER — Ambulatory Visit: Payer: Medicare Other | Admitting: Legal Medicine

## 2021-02-04 LAB — COMPREHENSIVE METABOLIC PANEL
ALT: 11 IU/L (ref 0–32)
AST: 20 IU/L (ref 0–40)
Albumin/Globulin Ratio: 1.6 (ref 1.2–2.2)
Albumin: 4.4 g/dL (ref 3.8–4.9)
Alkaline Phosphatase: 58 IU/L (ref 44–121)
BUN/Creatinine Ratio: 34 — ABNORMAL HIGH (ref 9–23)
BUN: 25 mg/dL — ABNORMAL HIGH (ref 6–24)
Bilirubin Total: 0.2 mg/dL (ref 0.0–1.2)
CO2: 24 mmol/L (ref 20–29)
Calcium: 9.9 mg/dL (ref 8.7–10.2)
Chloride: 103 mmol/L (ref 96–106)
Creatinine, Ser: 0.74 mg/dL (ref 0.57–1.00)
Globulin, Total: 2.8 g/dL (ref 1.5–4.5)
Glucose: 97 mg/dL (ref 65–99)
Potassium: 4.8 mmol/L (ref 3.5–5.2)
Sodium: 143 mmol/L (ref 134–144)
Total Protein: 7.2 g/dL (ref 6.0–8.5)
eGFR: 95 mL/min/{1.73_m2} (ref >59)

## 2021-02-04 LAB — HEMOGLOBIN A1C
Est. average glucose Bld gHb Est-mCnc: 114 mg/dL
Hgb A1c MFr Bld: 5.6 % (ref 4.8–5.6)

## 2021-02-04 LAB — CBC WITH DIFFERENTIAL/PLATELET
Basophils Absolute: 0.1 10*3/uL (ref 0.0–0.2)
Basos: 1 %
EOS (ABSOLUTE): 0.2 10*3/uL (ref 0.0–0.4)
Eos: 4 %
Hematocrit: 44 % (ref 34.0–46.6)
Hemoglobin: 14.1 g/dL (ref 11.1–15.9)
Immature Grans (Abs): 0 10*3/uL (ref 0.0–0.1)
Immature Granulocytes: 0 %
Lymphocytes Absolute: 2.1 10*3/uL (ref 0.7–3.1)
Lymphs: 46 %
MCH: 28.7 pg (ref 26.6–33.0)
MCHC: 32 g/dL (ref 31.5–35.7)
MCV: 89 fL (ref 79–97)
Monocytes Absolute: 0.3 10*3/uL (ref 0.1–0.9)
Monocytes: 7 %
Neutrophils Absolute: 1.9 10*3/uL (ref 1.4–7.0)
Neutrophils: 42 %
Platelets: 320 10*3/uL (ref 150–450)
RBC: 4.92 x10E6/uL (ref 3.77–5.28)
RDW: 13.5 % (ref 11.7–15.4)
WBC: 4.5 10*3/uL (ref 3.4–10.8)

## 2021-02-04 LAB — LIPID PANEL
Chol/HDL Ratio: 3.9 ratio (ref 0.0–4.4)
Cholesterol, Total: 246 mg/dL — ABNORMAL HIGH (ref 100–199)
HDL: 63 mg/dL (ref >39)
LDL Chol Calc (NIH): 155 mg/dL — ABNORMAL HIGH (ref 0–99)
Triglycerides: 156 mg/dL — ABNORMAL HIGH (ref 0–149)
VLDL Cholesterol Cal: 28 mg/dL (ref 5–40)

## 2021-02-04 LAB — CARDIOVASCULAR RISK ASSESSMENT

## 2021-02-04 LAB — VITAMIN D 25 HYDROXY (VIT D DEFICIENCY, FRACTURES): Vit D, 25-Hydroxy: 66.5 ng/mL (ref 30.0–100.0)

## 2021-02-04 LAB — TSH: TSH: 2.14 u[IU]/mL (ref 0.450–4.500)

## 2021-02-04 LAB — VITAMIN B12: Vitamin B-12: 656 pg/mL (ref 232–1245)

## 2021-02-04 NOTE — Progress Notes (Signed)
Vitamin D 66.5 normal range, B12 656 normal, CBC normal, BUN 25 high- need more fluids, liver tests normal, Triglycerides and LDL-c cholesterol high, A1c 5.6, TSH 2.14 normal, may need an injectable cholesterol medicine. lp

## 2021-02-04 NOTE — Progress Notes (Signed)
We can check but patient does not have established heart disease lp

## 2021-02-12 ENCOUNTER — Ambulatory Visit: Payer: Medicare Other

## 2021-02-19 ENCOUNTER — Other Ambulatory Visit: Payer: Self-pay

## 2021-02-19 ENCOUNTER — Ambulatory Visit (INDEPENDENT_AMBULATORY_CARE_PROVIDER_SITE_OTHER): Payer: Medicare Other

## 2021-02-19 DIAGNOSIS — Z23 Encounter for immunization: Secondary | ICD-10-CM | POA: Diagnosis not present

## 2021-02-19 NOTE — Progress Notes (Signed)
   Covid-19 Vaccination Clinic  Name:  Laura [name]    MRN: [id number] DOB: [birth year]  02/19/2021  Laura [name] was observed post Covid-19 immunization for 15 minutes without incident. She was provided with Vaccine Information Sheet and instruction to access the V-Safe system.   Laura [name] was instructed to call 911 with any severe reactions post vaccine: Marland Kitchen Difficulty breathing  . Swelling of face and throat  . A fast heartbeat  . A bad rash all over body  . Dizziness and weakness   Immunizations Administered    Name Date Dose VIS Date Route   Pfizer COVID-19 Vaccine 02/19/2021 10:04 AM 0.3 mL 08/20/2020 Intramuscular   Manufacturer: [place]   Lot: [id number]   [place]: [place], [place] 02/19/21 10:17 AM

## 2021-02-26 ENCOUNTER — Other Ambulatory Visit: Payer: Self-pay

## 2021-02-26 ENCOUNTER — Ambulatory Visit (INDEPENDENT_AMBULATORY_CARE_PROVIDER_SITE_OTHER): Payer: Medicare Other

## 2021-02-26 DIAGNOSIS — R0602 Shortness of breath: Secondary | ICD-10-CM

## 2021-02-26 LAB — ECHOCARDIOGRAM COMPLETE
AR max vel: 2.35 cm2
AV Area VTI: 2.18 cm2
AV Area mean vel: 2.33 cm2
AV Mean grad: 6 mmHg
AV Peak grad: 12.4 mmHg
Ao pk vel: 1.76 m/s
Area-P 1/2: 3.03 cm2
Calc EF: 58.9 %
P 1/2 time: 627 msec
S' Lateral: 3.1 cm
Single Plane A2C EF: 58.6 %
Single Plane A4C EF: 59.8 %

## 2021-02-27 ENCOUNTER — Telehealth: Payer: Self-pay

## 2021-02-27 NOTE — Telephone Encounter (Signed)
-----   Message from Wellington Hampshire, MD sent at 02/27/2021 12:19 PM EDT ----- Inform patient that echo was normal.

## 2021-02-27 NOTE — Telephone Encounter (Signed)
Called to give the patient echo results. DPR on file. lmom with results. Patient is to contact the office if any questions.. 

## 2021-03-13 ENCOUNTER — Other Ambulatory Visit: Payer: Self-pay

## 2021-03-13 ENCOUNTER — Encounter: Payer: Self-pay | Admitting: Legal Medicine

## 2021-03-13 ENCOUNTER — Other Ambulatory Visit: Payer: Self-pay | Admitting: Family Medicine

## 2021-03-13 MED ORDER — VALACYCLOVIR HCL 1 G PO TABS: 1000.0000 mg | ORAL_TABLET | Freq: Two times a day (BID) | ORAL | 0 refills | Status: DC | PRN

## 2021-03-24 ENCOUNTER — Other Ambulatory Visit: Payer: Self-pay | Admitting: Cardiovascular Disease

## 2021-04-20 ENCOUNTER — Telehealth: Payer: Medicare Other

## 2021-04-20 NOTE — Progress Notes (Deleted)
Chronic Care Management Pharmacy Note  04/20/2021 Name:  Laura [name] MRN:  [id number] DOB:  22-Feb-1966   Plan Recommendations:  Patient reports symptoms of fatigue, tingling sensation, history of b-12 deficiency history of low magnesium, history of low vitamin D. She would like to have these items checked with upcoming labs during fasting appointment 02/03/2021.  Patient has had continual headache since COVID. She is still experiencing fatigue after COVID.  Patient has many intolerances to medications and OTC supplements. She has had allergy testing many years ago but is curious if she should consider seeing an allergist or consider allergy testing.  Patient would like to know if you recommend third COVID vaccine. Patient was treated inpatient with steroids.   Subjective: Laura [name] is an 55 y.o. year old female who is a primary patient of Henrene Pastor, Zeb Comfort, MD.  The CCM team was consulted for assistance with disease management and care coordination needs.    Engaged with patient by telephone for follow up visit in response to provider referral for pharmacy case management and/or care coordination services.   Consent to Services:  The patient was given information about Chronic Care Management services, agreed to services, and gave verbal consent prior to initiation of services.  Please see initial visit note for detailed documentation.   Patient Care Team: Lillard Anes, MD as PCP - General (Family Medicine) Wellington Hampshire, MD as PCP - Cardiology (Cardiology) Sueanne Margarita, MD as PCP - Sleep Medicine (Cardiology) Gatha Mayer, MD as Consulting Physician (Gastroenterology) Murlean Iba, MD (Internal Medicine) Burnice Logan, [facility] as Pharmacist (Pharmacist)  Recent office visits: 11/12/2020 - COVID positive.    Recent consult visits: None in chart since last ccm visit  Hospital visits: None in previous 6 months  Objective:  Lab Results  Component  Value Date   CREATININE 0.74 02/03/2021   BUN 25 (H) 02/03/2021   GFRNONAA 75 05/19/2020   GFRAA 86 05/19/2020   NA 143 02/03/2021   K 4.8 02/03/2021   CALCIUM 9.9 02/03/2021   CO2 24 02/03/2021   GLUCOSE 97 02/03/2021    Lab Results  Component Value Date/Time   HGBA1C 5.6 02/03/2021 08:05 AM   HGBA1C 6.0 (H) 04/15/2020 01:41 PM    Last diabetic Eye exam: No results found for: HMDIABEYEEXA  Last diabetic Foot exam: No results found for: HMDIABFOOTEX   Lab Results  Component Value Date   CHOL 246 (H) 02/03/2021   HDL 63 02/03/2021   LDLCALC 155 (H) 02/03/2021   LDLDIRECT 222 (H) 04/26/2019   TRIG 156 (H) 02/03/2021   CHOLHDL 3.9 02/03/2021    Hepatic Function Latest Ref Rng & Units 02/03/2021 05/19/2020 04/15/2020  Total Protein 6.0 - 8.5 g/dL 7.2 7.1 7.2  Albumin 3.8 - 4.9 g/dL 4.4 4.4 4.7  AST 0 - 40 IU/L 20 19 19   ALT 0 - 32 IU/L 11 14 18   Alk Phosphatase 44 - 121 IU/L 58 73 72  Total Bilirubin 0.0 - 1.2 mg/dL 0.2 0.2 0.3  Bilirubin, Direct 0.0 - 0.3 mg/dL - - -    Lab Results  Component Value Date/Time   TSH 2.140 02/03/2021 08:05 AM   TSH 0.697 06/30/2020 03:21 PM   FREET4 1.37 12/16/2015 04:32 PM   FREET4 1.12 11/24/2015 08:26 AM    CBC Latest Ref Rng & Units 02/03/2021 05/19/2020 04/15/2020  WBC 3.4 - 10.8 x10E3/uL 4.5 6.9 6.5  Hemoglobin 11.1 - 15.9 g/dL 14.1 13.2 13.6  Hematocrit  34.0 - 46.6 % 44.0 39.1 42.8  Platelets 150 - 450 x10E3/uL 320 408 387    Lab Results  Component Value Date/Time   VD25OH 66.5 02/03/2021 08:06 AM   VD25OH 42 06/20/2019 09:08 AM    Clinical ASCVD: No  The 10-year ASCVD risk score Mikey Bussing DC Jr., et al., 2013) is: 6.3%   Values used to calculate the score:     Age: 55 years     Sex: Female     Is Non-Hispanic African American: No     Diabetic: Yes     Tobacco smoker: No     Systolic Blood Pressure: 754 mmHg     Is BP treated: Yes     HDL Cholesterol: 63 mg/dL     Total Cholesterol: 246 mg/dL    Depression screen [facility] 2/9  02/03/2021 06/20/2019 12/05/2018  Decreased Interest 1 2 0  Down, Depressed, Hopeless 1 1 0  PHQ - 2 Score 2 3 0  Altered sleeping 3 3 0  Tired, decreased energy 3 2 0  Change in appetite 0 2 0  Feeling bad or failure about yourself  1 1 0  Trouble concentrating 3 1 0  Moving slowly or fidgety/restless 1 1 0  Suicidal thoughts 0 0 0  PHQ-9 Score 13 13 0  Difficult doing work/chores Extremely dIfficult Somewhat difficult Not difficult at all  Some recent data might be hidden     Social History   Tobacco Use  Smoking Status Former   Pack years: 0.00   Types: Cigarettes   Quit date: 04/08/1995   Years since quitting: 26.0  Smokeless Tobacco Never   BP Readings from Last 3 Encounters:  02/03/21 (!) 136/98  02/03/21 122/68  08/14/20 125/83   Pulse Readings from Last 3 Encounters:  02/03/21 64  02/03/21 88  08/14/20 62   Wt Readings from Last 3 Encounters:  02/03/21 163 lb (73.9 kg)  02/03/21 164 lb (74.4 kg)  11/12/20 157 lb 6.4 oz (71.4 kg)   BMI Readings from Last 3 Encounters:  02/03/21 27.98 kg/m  02/03/21 28.15 kg/m  11/12/20 26.19 kg/m    Assessment/Interventions: Review of patient past medical history, allergies, medications, health status, including review of consultants reports, laboratory and other test data, was performed as part of comprehensive evaluation and provision of chronic care management services.   SDOH:  (Social Determinants of Health) assessments and interventions performed: Yes   SDOH Screenings   Alcohol Screen: Not on file  Depression (PHQ2-9): Medium Risk   PHQ-2 Score: 13  Financial Resource Strain: Not on file  Food Insecurity: Food Insecurity Present   Worried About Charity fundraiser in the Last Year: Often true   Arboriculturist in the Last Year: Often true  Housing: Not on file  Physical Activity: Not on file  Social Connections: Not on file  Stress: Not on file  Tobacco Use: Medium Risk   Smoking Tobacco Use: Former    Smokeless Tobacco Use: Never  Transportation Needs: No Data processing manager (Medical): No   Lack of Transportation (Non-Medical): No    CCM Care Plan  Allergies  Allergen Reactions   Meperidine Hives   Shellfish Allergy Shortness Of Breath and Swelling   Diltiazem Swelling   Acebutolol Swelling and Other (See Comments)   Amlodipine     Swelling    Celebrex [Celecoxib] Swelling    Patient began taking for knee pain and started swelling (hands, feet, face).  Codeine Hives and Nausea And Vomiting   Dexlansoprazole Other (See Comments)    Abdominal pain   Dronedarone Swelling and Other (See Comments)   Duloxetine Hcl Other (See Comments)    Made pt feel crazy   Flecainide Swelling   Gabapentin Other (See Comments)    Makes her feel crazy    Hydrocodone Other (See Comments)    Keeps patient awake.   Losartan     Swelling    Metoprolol Swelling   Mexiletine     Swelling - hands, legs, face   Mirtazapine Swelling   Omeprazole Other (See Comments)    Abdominal pain   Oxycodone Itching   Sectral [Acebutolol Hcl] Swelling   Toradol [Ketorolac Tromethamine] Itching   Tramadol     Unable to sleep, makes her itch    Medications Reviewed Today     Reviewed by Wellington Hampshire, MD (Physician) on 02/03/21 at [place] List Status: <None>   Medication Order Taking? Sig Documenting Provider Last Dose Status Informant  albuterol (VENTOLIN HFA) 108 (90 Base) MCG/ACT inhaler [id number] Yes Inhale 2 puffs into the lungs every 4 (four) hours as needed for wheezing or shortness of breath. Delsa Grana, PA-C Taking Active Self  ALPRAZolam Duanne Moron) 1 MG tablet [id number] Yes Take 1 mg by mouth 4 (four) times daily.  [provider] Taking Active Self           Med Note Rosemarie Beath, MELISSA B   Wed Apr 06, 2017  5:57 PM)    amphetamine-dextroamphetamine (ADDERALL) 30 MG tablet [id number] Yes Take 30 mg by mouth 3 (three) times daily.  [provider]  Taking Active Self           Med Note Cyndi Bender, EMILY R   Wed Oct 03, 2019  9:49 AM)    EPINEPHrine 0.3 mg/0.3 mL IJ SOAJ injection [id number] Yes Inject 0.3 mLs (0.3 mg total) into the muscle as needed for anaphylaxis. Lillard Anes, MD Taking Active   furosemide (LASIX) 40 MG tablet [id number] Yes Take 40 mg by mouth 2 (two) times daily. [provider] Taking Active Self  hydrochlorothiazide (HYDRODIURIL) 25 MG tablet [id number] Yes Take 25 mg by mouth as needed. [provider] Taking Active   hydrOXYzine (VISTARIL) 100 MG capsule [id number] Yes Take 1 capsule (100 mg total) by mouth 3 (three) times daily as needed for itching. Lillard Anes, MD Taking Active   levothyroxine (SYNTHROID) 112 MCG tablet [id number] Yes Take 1 tablet (112 mcg total) by mouth daily. Lillard Anes, MD Taking Active   Potassium Chloride CRYS [id number] Yes  [provider] Taking Active   potassium chloride SA (KLOR-CON) 20 MEQ tablet [id number] Yes TAKE 1 TABLET BY MOUTH  DAILY Lillard Anes, MD Taking Active   quinapril (ACCUPRIL) 20 MG tablet [id number] Yes Take 1 tablet (20 mg total) by mouth 2 (two) times daily. Wellington Hampshire, MD Taking Active   Semaglutide,0.25 or 0.5MG/DOS, (OZEMPIC, 0.25 OR 0.5 MG/DOSE,) 2 MG/1.5ML SOPN [id number] Yes Inject 0.5 mg into the skin once a week. Lillard Anes, MD Taking Active   tiZANidine (ZANAFLEX) 4 MG tablet [id number] Yes Take 4 mg by mouth 3 (three) times daily as needed.  [provider] Taking Active Self           Med Note Nash Mantis, TIFFANI S   Sat Sep 01, 2019  7:14 AM)    valACYclovir (VALTREX) 1000 MG tablet [id number] Yes Take 1,000 mg by mouth  2 (two) times daily as needed.  [provider] Taking Active   zolpidem (AMBIEN) 10 MG tablet [id number] Yes Take 10 mg by mouth at bedtime. 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   Recurrent UTI 05/10/2017   Status post hysterectomy 03/08/2017   Barrett's esophagus 11/02/2016   Difficulty with speech 10/18/2016   Memory impairment 10/18/2016   Chronic pain syndrome 10/12/2016   Chronic low back pain (Location of Tertiary source of pain) (Bilateral) (L>R) 10/12/2016   Lumbar facet joint syndrome 10/12/2016   Chronic sacroiliac joint pain 10/12/2016   Chronic shoulder pain (Location of Primary Source of Pain) (Bilateral) (R>L) 10/12/2016   Chronic neck pain (Location of Secondary source of pain) (Bilateral) (R>L) 10/12/2016   Chronic upper back pain (Bilateral) (L>R) 10/12/2016   Chronic hand pain (Bilateral) (R>L) 10/12/2016   Chronic hand pain (Right) 10/12/2016   Chronic hand pain (Left) 10/12/2016   Chronic knee pain (Right) 10/12/2016   CKD (chronic kidney disease) 10/11/2016   Vitamin B12 deficiency 07/26/2016   Anxiety 07/06/2016   Depression 07/06/2016   Raynaud disease 05/06/2016   Fibromyalgia 02/13/2016   Prediabetes 12/23/2015   Heart palpitations 12/16/2015   Heart murmur 12/16/2015   Mixed hyperlipidemia 11/19/2015   Gastroesophageal reflux disease 06/13/2015   Osteoarthrosis, generalized, multiple joints 06/06/2015   Vaginal enterocele    Urinary retention with incomplete bladder emptying    Obesity, Class I, BMI 30.0-34.9 (see actual BMI)    Rectocele    Cystocele    Hypothyroidism, unspecified 12/03/2014   Essential hypertension 12/03/2014   Vitamin D deficiency 12/03/2014   Asthma in adult, mild  persistent, uncomplicated 19/62/2297   Insomnia 08/31/2013    Immunization History  Administered Date(s) Administered   Influenza Inj Mdck Quad Pf 07/22/2020   Influenza Split 08/15/2013   Influenza,inj,Quad PF,6+ Mos 12/04/2014, 08/29/2015, 07/26/2016, 08/11/2017, 08/06/2019   Influenza-Unspecified 12/05/2015   PFIZER Comirnaty(Gray Top)Covid-19 Tri-Sucrose Vaccine 02/19/2021   PFIZER(Purple Top)SARS-COV-2 Vaccination 01/24/2020, 02/19/2020   Pneumococcal Polysaccharide-23 12/18/2015   Tdap 08/24/2013, 05/25/2016    Conditions to be addressed/monitored:  Hypertension, Hyperlipidemia and Diabetes  There are no care plans that you recently modified to display for this patient.    Medication Assistance:  Ozempicobtained through [employer] medication assistance program.  Enrollment ends 09/30/2021  Patient's preferred pharmacy is:  [facility]  ([place], [place], [address] [place], [address] [place] [address] Phone: [phone] Fax: [phone]  [facility] [phone] - [place], [address] AT [place] [address] [place] [address] Phone: [phone] Fax: [phone]  [facility] - [place], [place] - [address] [place] [address] Phone: [phone] Fax: [place], [place]  [place] [address] Phone: [phone] Fax: [phone]  Uses pill box? Yes Pt endorses good compliance  We discussed: Current pharmacy is preferred with insurance plan and patient is satisfied with pharmacy services Patient decided to: Continue current medication management strategy  Care Plan and Follow Up Patient Decision:  Patient agrees to Care Plan and Follow-up.  Plan: Telephone  follow up appointment with care management team member scheduled for:  04/20/2021

## 2021-04-22 ENCOUNTER — Telehealth: Payer: Self-pay

## 2021-04-22 NOTE — Progress Notes (Signed)
Chronic Care Management Pharmacy Assistant   Name: LEVERN KALKA  MRN: [id number] DOB: [birth year]   Reason for Encounter: Disease State for general adherence  Recent office visits:  03/13/21- Patient message for PCP, patient request Valacyclovir for cold sore  02/19/21-Dr. Henrene Pastor PCP, patient got a Covid vaccine  02/03/21-Dr Henrene Pastor PCP, hypertension, labs, follow up 60mos Vitamin D 66.5 normal range, B12 656 normal, CBC normal, BUN 25 high- need more fluids, liver tests normal, Triglycerides and LDL-c cholesterol high, A1c 5.6,TSH 2.14 normal, may need an injectable cholesterol medicine  Recent consult visits:  02/03/21-Dr [facility], cardiology, PVC, start HCTZ 25mg  as needed, EKG was ordered, The ekg ordered today demonstrates normal sinus rhythm with left axis deviation.  No PVCs. Follow up one year   Hospital visits:  11/20/20-ED- rash and other skin irritation   11/17/20-ED-Urticaria   Medications: Outpatient Encounter Medications as of 04/22/2021  Medication Sig   albuterol (VENTOLIN HFA) 108 (90 Base) MCG/ACT inhaler Inhale 2 puffs into the lungs every 4 (four) hours as needed for wheezing or shortness of breath.   ALPRAZolam (XANAX) 1 MG tablet Take 1 mg by mouth 4 (four) times daily.    amphetamine-dextroamphetamine (ADDERALL) 30 MG tablet Take 30 mg by mouth 3 (three) times daily.    EPINEPHrine 0.3 mg/0.3 mL IJ SOAJ injection Inject 0.3 mLs (0.3 mg total) into the muscle as needed for anaphylaxis.   furosemide (LASIX) 40 MG tablet Take 40 mg by mouth 2 (two) times daily.   hydrochlorothiazide (HYDRODIURIL) 25 MG tablet Take 25 mg by mouth as needed.   hydrOXYzine (VISTARIL) 100 MG capsule Take 1 capsule (100 mg total) by mouth 3 (three) times daily as needed for itching.   levothyroxine (SYNTHROID) 112 MCG tablet Take 1 tablet (112 mcg total) by mouth daily.   Potassium Chloride CRYS    potassium chloride SA (KLOR-CON) 20 MEQ tablet TAKE 1 TABLET BY MOUTH  DAILY   quinapril  (ACCUPRIL) 20 MG tablet TAKE 1 TABLET BY MOUTH  TWICE DAILY   Semaglutide,0.25 or 0.5MG /DOS, (OZEMPIC, 0.25 OR 0.5 MG/DOSE,) 2 MG/1.5ML SOPN Inject 0.5 mg into the skin once a week.   tiZANidine (ZANAFLEX) 4 MG tablet Take 4 mg by mouth 3 (three) times daily as needed.    valACYclovir (VALTREX) 1000 MG tablet Take 1 tablet (1,000 mg total) by mouth 2 (two) times daily as needed.   zolpidem (AMBIEN) 10 MG tablet Take 10 mg by mouth at bedtime.   No facility-administered encounter medications on file as of 04/22/2021.    Patient stated she has not been feeling well for a while.  She stated she has had a constant headache since she had covid previously.  She did no react well to some Tylenol she took yesterday, she said she swelled some and started itching, that has now resolved.  She stated she has had stomach painin her upper right quadarant area, she suspects it may be her gallbladder.  I advised her to call the office and make an appointment to have that and her headaches addressed .  I let her know that her Ozempic was at the office to be picked up as well.    She is requesting a script be sent into [facility] on [address] for her testing supplies, I have notified Donette Larry CPP.    She stated her fasting glucose today was 109.  Care Gaps: AWV: none listed   Star Rating Drugs: Furosemide  04/01/21  90 Levothyroxine         03/13/21  [place], [place] Pharmacist Assistant [phone]

## 2021-04-24 ENCOUNTER — Telehealth: Payer: Self-pay

## 2021-04-24 NOTE — Telephone Encounter (Signed)
Patient assistance received for Ozempic. Patient is not aware. Medication placed in the sample refrigerator.

## 2021-04-30 ENCOUNTER — Other Ambulatory Visit: Payer: Self-pay

## 2021-04-30 MED ORDER — ACCU-CHEK GUIDE W/DEVICE KIT: 1.0000 | PACK | Freq: Every day | 0 refills | Status: DC

## 2021-04-30 MED ORDER — ACCU-CHEK GUIDE VI STRP: 1.0000 | ORAL_STRIP | Freq: Every day | 12 refills | Status: AC

## 2021-05-01 ENCOUNTER — Other Ambulatory Visit: Payer: Self-pay

## 2021-05-01 ENCOUNTER — Telehealth: Payer: Self-pay | Admitting: Legal Medicine

## 2021-05-01 ENCOUNTER — Encounter: Payer: Self-pay | Admitting: Legal Medicine

## 2021-05-01 ENCOUNTER — Ambulatory Visit (INDEPENDENT_AMBULATORY_CARE_PROVIDER_SITE_OTHER): Payer: Medicare Other | Admitting: Legal Medicine

## 2021-05-01 VITALS — BP 100/80 | HR 56 | Temp 97.9°F | Resp 16 | Ht 64.0 in | Wt 163.0 lb

## 2021-05-01 DIAGNOSIS — E039 Hypothyroidism, unspecified: Secondary | ICD-10-CM

## 2021-05-01 DIAGNOSIS — R7303 Prediabetes: Secondary | ICD-10-CM

## 2021-05-01 DIAGNOSIS — Z6827 Body mass index (BMI) 27.0-27.9, adult: Secondary | ICD-10-CM | POA: Insufficient documentation

## 2021-05-01 DIAGNOSIS — J0100 Acute maxillary sinusitis, unspecified: Secondary | ICD-10-CM

## 2021-05-01 DIAGNOSIS — G44221 Chronic tension-type headache, intractable: Secondary | ICD-10-CM | POA: Diagnosis not present

## 2021-05-01 DIAGNOSIS — F988 Other specified behavioral and emotional disorders with onset usually occurring in childhood and adolescence: Secondary | ICD-10-CM | POA: Diagnosis not present

## 2021-05-01 DIAGNOSIS — I10 Essential (primary) hypertension: Secondary | ICD-10-CM

## 2021-05-01 DIAGNOSIS — R1011 Right upper quadrant pain: Secondary | ICD-10-CM | POA: Diagnosis not present

## 2021-05-01 MED ORDER — FLUCONAZOLE 150 MG PO TABS: 150.0000 mg | ORAL_TABLET | Freq: Every day | ORAL | 0 refills | Status: DC

## 2021-05-01 MED ORDER — AMOXICILLIN 875 MG PO TABS: 875.0000 mg | ORAL_TABLET | Freq: Two times a day (BID) | ORAL | 0 refills | Status: AC

## 2021-05-01 NOTE — Progress Notes (Signed)
Established Patient Office Visit  Subjective:  Patient ID: Laura [name], female    DOB: 04-21-1966  Age: 55 y.o. MRN: [id number]  CC:  Chief Complaint  Patient presents with   Headache    Patient has headache almost everyday after she had covid in January.   Abdominal Pain    Since 8 moths ago, patient noticed RUQ aching, dull pain and it is worse after she eats.   Fatigue    Patient did covid test this morning was negative.    HPI Auset I Atkins presents for fatigue, she is on 106m adderall qd. She has fibromyalgia.  She denies depression.  Patient has abdominal pain after eating.  Feels hot in stomach and back.  Some nausea.  Chronic headaches daily since covid in January.  Patient present with prediabetes.  BS 130 today.  She has chronic hypothyroidism. Past Medical History:  Diagnosis Date   Anxiety and depression    Bowel obstruction [place]    Chest pain    a. 01/2016 Ex MV: Hypertensive response. Freq PVCs w/ exercise. nl EF. No ST/T changes. No ischemia.   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 03/2016 Echo: EF 60-65%, no rwma, mild MR, nl LA size, nl RV fxn.   High cholesterol    Kidney stones    Nasal septal perforation 05/12/2018   Hx of cocaine use   Palpitations    a. 03/2016 Holter: Sinus rhythm, avg HR 83, max 123, min 64. 4 PACs. 10,356 isolated PVCs, one vent couplet, 3842 V bigeminy, 4 beats NSVT->prev on BB - dc 2/2 swelling.   Prediabetes 12/23/2015   Overview:  Hba1c higher but not diabetic. Took metformin to try to lessen   Raynaud disease    Rectocele    Sleep apnea    "mild" per pt   Torn rotator cuff    left   Urinary retention with incomplete bladder emptying    Vaginal dryness, menopausal    Vaginal enterocele    Vitamin D deficiency 12/03/2014   Wears hearing aid in both ears     Past Surgical History:  Procedure Laterality Date   220HOUR [place] STUDY N/A 10/11/2018   Procedure: 24 HOUR PH STUDY;  Surgeon: NMauri Pole MD;  [facility];  Service: Endoscopy;  Laterality: N/A;   ABDOMINAL HYSTERECTOMY     ANKLE SURGERY     [place]   Procedure: BIOPSY;  Surgeon: MRush LandmarkGTelford Nab, MD;  [place]  Service: Gastroenterology;;   COLONOSCOPY     COLONOSCOPY WITH PROPOFOL N/A 09/02/2019   Procedure: COLONOSCOPY WITH PROPOFOL;  Surgeon: MIrving Copas, MD;  [place]  Service: Gastroenterology;  Laterality: N/A;   COLPORRHAPHY  2015   posterior and enterocele ligation   CYSTOSCOPY  04/11/2017   Procedure: CYSTOSCOPY;  Surgeon: Defrancesco, MAlanda Slim MD;  Location: [facility];  Service: Gynecology;;   ESOPHAGEAL MANOMETRY N/A 10/11/2018   Procedure: ESOPHAGEAL MANOMETRY (EM);  Surgeon: NMauri Pole MD;  [facility];  Service: Endoscopy;  Laterality: N/A;   ESOPHAGOGASTRODUODENOSCOPY (EGD) WITH PROPOFOL N/A 09/02/2019   Procedure: ESOPHAGOGASTRODUODENOSCOPY (EGD) WITH PROPOFOL;  Surgeon: MRush LandmarkGTelford Nab, MD;  [place]  Service: Gastroenterology;  Laterality: N/A;  EXTRACORPOREAL SHOCK WAVE LITHOTRIPSY Left 07/31/2020   Procedure: EXTRACORPOREAL SHOCK WAVE LITHOTRIPSY (ESWL);  Surgeon: Billey Co, MD;  Location: [facility];  Service: Urology;  Laterality: Left;   KNEE ARTHROSCOPY WITH MEDIAL MENISECTOMY Right 02/04/2020   Procedure: KNEE ARTHROSCOPY WITH PARTIAL LATERAL AND MEDIAL MENISECTOMY,  PARTIAL SYNOVECTOMY AND CHONDROPLASTY;  Surgeon: Lovell Sheehan, MD;  [place];  Service: Orthopedics;  Laterality: Right;   LAPAROSCOPIC SALPINGO OOPHERECTOMY Left 04/11/2017   Procedure: LAPAROSCOPIC LEFT SALPINGO OOPHORECTOMY;  Surgeon: Brayton Mars, MD;  Location: [facility];  Service: Gynecology;  Laterality: Left;   LITHOTRIPSY     OOPHORECTOMY      PARTIAL HYSTERECTOMY     [place] IMPEDANCE STUDY N/A 10/11/2018   Procedure: [place] IMPEDANCE STUDY;  Surgeon: Mauri Pole, MD;  [facility];  Service: Endoscopy;  Laterality: N/A;   PVC ABLATION N/A 01/18/2020   Procedure: PVC ABLATION;  Surgeon: Thompson Grayer, MD;  Location: [place] [facility];  Service: Cardiovascular;  Laterality: N/A;   thumb surgery     UPPER GASTROINTESTINAL ENDOSCOPY      Family History  Problem Relation Age of Onset   Stroke Father    Diabetes Father    Breast cancer Mother 3   Diverticulitis Mother    Esophageal cancer Maternal Grandfather    Colon cancer Paternal Grandmother    Suicidality Brother    Ovarian cancer Neg Hx    Stomach cancer Neg Hx     Social History   Socioeconomic History   Marital status: Legally Separated    Spouse name: Not on file   Number of children: 4   Years of education: Not on file   Highest education level: Some college, no degree  Occupational History   Occupation: Disabled  Tobacco Use   Smoking status: Former    Pack years: 0.00    Types: Cigarettes    Quit date: 04/08/1995    Years since quitting: 26.0   Smokeless tobacco: Never  Vaping Use   Vaping Use: Never used  Substance and Sexual Activity   Alcohol use: Not Currently    Alcohol/week: 1.0 standard drink    Types: 1 Glasses of wine per week    Comment: rare; once every 6 months   Drug use: No   Sexual activity: Yes    Partners: Male    Birth control/protection: Surgical  Other Topics Concern   Not on file  Social History Narrative   Separated - 1 son and 1 daughter   Disabled    1 caffeine/day   Past smoker   No EtOH, drugs      08/02/2018      Social Determinants of Health   Financial Resource Strain: Not on file  Food Insecurity: Food Insecurity Present   Worried About [place] in the Last Year: Often true   Arboriculturist in the Last Year: Often true  Transportation Needs: No Transportation Needs   Lack of  Transportation (Medical): No   Lack of Transportation (Non-Medical): No  Physical Activity: Not on file  Stress: Not on file  Social Connections: Not on file  Intimate Partner Violence: Not on file    Outpatient Medications Prior to Visit  Medication Sig Dispense Refill   albuterol (VENTOLIN HFA) 108 (90 Base) MCG/ACT inhaler Inhale 2 puffs into the lungs every 4 (four) hours as needed for wheezing or shortness of breath. 18 g 3   ALPRAZolam (XANAX) 1 MG  tablet Take 1 mg by mouth 4 (four) times daily.      amphetamine-dextroamphetamine (ADDERALL) 30 MG tablet Take 30 mg by mouth 3 (three) times daily.   0   Blood Glucose Monitoring Suppl (ACCU-CHEK GUIDE) w/Device KIT 1 each by Does not apply route daily. 1 kit 0   EPINEPHrine 0.3 mg/0.3 mL IJ SOAJ injection Inject 0.3 mLs (0.3 mg total) into the muscle as needed for anaphylaxis. 1 each 2   furosemide (LASIX) 40 MG tablet Take 40 mg by mouth 2 (two) times daily.     glucose blood (ACCU-CHEK GUIDE) test strip 1 each by Other route daily in the afternoon. Use as instructed 100 each 12   hydrochlorothiazide (HYDRODIURIL) 25 MG tablet Take 25 mg by mouth as needed.     hydrOXYzine (VISTARIL) 100 MG capsule Take 1 capsule (100 mg total) by mouth 3 (three) times daily as needed for itching. 30 capsule 0   levothyroxine (SYNTHROID) 112 MCG tablet Take 1 tablet (112 mcg total) by mouth daily. 90 tablet 3   Potassium Chloride CRYS      potassium chloride SA (KLOR-CON) 20 MEQ tablet TAKE 1 TABLET BY MOUTH  DAILY 90 tablet 2   quinapril (ACCUPRIL) 20 MG tablet TAKE 1 TABLET BY MOUTH  TWICE DAILY 180 tablet 2   Semaglutide,0.25 or 0.5MG/DOS, (OZEMPIC, 0.25 OR 0.5 MG/DOSE,) 2 MG/1.5ML SOPN Inject 0.5 mg into the skin once a week. 1.5 mL 0   tiZANidine (ZANAFLEX) 4 MG tablet Take 4 mg by mouth 3 (three) times daily as needed.   0   valACYclovir (VALTREX) 1000 MG tablet Take 1 tablet (1,000 mg total) by mouth 2 (two) times daily as needed. 20 tablet 0    zolpidem (AMBIEN) 10 MG tablet Take 10 mg by mouth at bedtime.     No facility-administered medications prior to visit.    Allergies  Allergen Reactions   Meperidine Hives   Shellfish Allergy Shortness Of Breath and Swelling   Diltiazem Swelling   Acebutolol Swelling and Other (See Comments)   Amlodipine     Swelling    Celebrex [Celecoxib] Swelling    Patient began taking for knee pain and started swelling (hands, feet, face).    Codeine Hives and Nausea And Vomiting   Dexlansoprazole Other (See Comments)    Abdominal pain   Dronedarone Swelling and Other (See Comments)   Duloxetine Hcl Other (See Comments)    Made pt feel crazy   Flecainide Swelling   Gabapentin Other (See Comments)    Makes her feel crazy    Hydrocodone Other (See Comments)    Keeps patient awake.   Losartan     Swelling    Metoprolol Swelling   Mexiletine     Swelling - hands, legs, face   Mirtazapine Swelling   Omeprazole Other (See Comments)    Abdominal pain   Oxycodone Itching   Sectral [Acebutolol Hcl] Swelling   Toradol [Ketorolac Tromethamine] Itching   Tramadol     Unable to sleep, makes her itch    ROS Review of Systems  Constitutional:  Positive for fatigue. Negative for activity change and appetite change.  HENT:  Negative for congestion.   Eyes:  Negative for visual disturbance.  Respiratory:  Negative for chest tightness and shortness of breath.   Cardiovascular:  Negative for chest pain, palpitations and leg swelling.  Gastrointestinal:  Negative for abdominal distention and abdominal pain.  Genitourinary:  Negative for difficulty urinating and dysuria.  Musculoskeletal:  Negative for arthralgias and back pain.  Skin: Negative.   Neurological: Negative.   Psychiatric/Behavioral: Negative.       Objective:    Physical Exam Vitals reviewed.  Constitutional:      Appearance: She is ill-appearing.  Eyes:     Extraocular Movements: Extraocular movements intact.      Pupils: Pupils are equal, round, and reactive to light.  Neck:     Comments: Pain on touch Cardiovascular:     Rate and Rhythm: Normal rate and regular rhythm.     Heart sounds: No murmur heard.   No gallop.  Pulmonary:     Effort: Pulmonary effort is normal. No respiratory distress.     Breath sounds: Normal breath sounds. No wheezing.  Abdominal:     General: Bowel sounds are normal.     Palpations: Abdomen is soft.  Musculoskeletal:        General: Normal range of motion.     Cervical back: Normal range of motion and neck supple.     Comments: Diffuse tender points all 4 extremities  Skin:    General: Skin is warm.     Capillary Refill: Capillary refill takes less than 2 seconds.  Neurological:     Mental Status: She is alert.     Sensory: No sensory deficit.     Coordination: Romberg sign negative.     Gait: Gait normal.     Deep Tendon Reflexes: Reflexes normal.  Psychiatric:        Mood and Affect: Mood is depressed.    BP 100/80   Pulse (!) 56   Temp 97.9 F (36.6 C)   Resp 16   Ht 5' 4"  (1.626 m)   Wt 163 lb (73.9 kg)   SpO2 97%   BMI 27.98 kg/m  Wt Readings from Last 3 Encounters:  05/01/21 163 lb (73.9 kg)  02/03/21 163 lb (73.9 kg)  02/03/21 164 lb (74.4 kg)     Health Maintenance Due  Topic Date Due   Pneumococcal Vaccine 63-92 Years old (1 - PCV) Never done   Zoster Vaccines- Shingrix (1 of 2) Never done   MAMMOGRAM  08/07/2020    There are no preventive care reminders to display for this patient.  Lab Results  Component Value Date   TSH 4.020 05/01/2021   Lab Results  Component Value Date   WBC 5.7 05/01/2021   HGB 13.1 05/01/2021   HCT 41.5 05/01/2021   MCV 87 05/01/2021   PLT 313 05/01/2021   Lab Results  Component Value Date   NA 140 05/01/2021   K 4.5 05/01/2021   CO2 28 05/01/2021   GLUCOSE 99 05/01/2021   BUN 21 05/01/2021   CREATININE 0.75 05/01/2021   BILITOT 0.4 05/01/2021   ALKPHOS 62 05/01/2021   AST 18 05/01/2021    ALT 15 05/01/2021   PROT 6.7 05/01/2021   ALBUMIN 4.4 05/01/2021   CALCIUM 10.1 05/01/2021   ANIONGAP 14 11/07/2019   EGFR 94 05/01/2021   Lab Results  Component Value Date   CHOL 246 (H) 02/03/2021   Lab Results  Component Value Date   HDL 63 02/03/2021   Lab Results  Component Value Date   LDLCALC 155 (H) 02/03/2021   Lab Results  Component Value Date   TRIG 156 (H) 02/03/2021   Lab Results  Component Value Date   CHOLHDL 3.9 02/03/2021   Lab Results  Component Value Date   HGBA1C 5.9 (H) 05/01/2021  Assessment & Plan:   Problem List Items Addressed This Visit       Cardiovascular and Mediastinum   Essential hypertension - Primary   Relevant Orders   CBC with Differential/Platelet (Completed)   Comprehensive metabolic panel (Completed)   T3, Free (Completed) An individual hypertension care plan was established and reinforced today.  The patient's status was assessed using clinical findings on exam and labs or diagnostic tests. The patient's success at meeting treatment goals on disease specific evidence-based guidelines and found to be well controlled. SELF MANAGEMENT: The patient and I together assessed ways to personally work towards obtaining the recommended goals. RECOMMENDATIONS: avoid decongestants found in common cold remedies, decrease consumption of alcohol, perform routine monitoring of BP with home BP cuff, exercise, reduction of dietary salt, take medicines as prescribed, try not to miss doses and quit smoking.  Regular exercise and maintaining a healthy weight is needed.  Stress reduction may help. A CLINICAL SUMMARY including written plan identify barriers to care unique to individual due to social or financial issues.  We attempt to mutually creat solutions for individual and family understanding.      Endocrine   Hypothyroidism, unspecified   Relevant Orders   TSH (Completed)   T4, Free (Completed) Patient is known to have hypothyroidism  and is n treatment with levothyroxine 122mg.  Patient was diagnosed 10 years ago.  Other treatment includes none.  Patient is compliant with medicines and last TSH 6 months ago.  Last TSH was normal.      Other   Prediabetes   Relevant Orders   Hemoglobin A1c (Completed) Patient is on diet and exercise to keep glucose under control    BMI 27.0-27.9,adult Patient is overweight and we discussed cutting calories and increase exercise    ADD (attention deficit disorder)    [place] and reinforced today.  The patient's status was assessed using clinical findings on exam, labs, and other diagnostic testing. Patient's success at meeting treatment goals based on disease specific evidence-bassed guidelines and found to be in good control. RECOMMENDATIONS include maintaining present medicines and treatment.        Other Visit Diagnoses     Chronic tension-type headache, intractable       Relevant Orders   Ambulatory referral to Neurology AN INDIVIDUAL CARE PLAN for Chronic tension type headaches since having Covid was established and reinforced today.  The patient's status was assessed using clinical findings on exam, labs, and other diagnostic testing. Patient's success at meeting treatment goals based on disease specific evidence-bassed guidelines and found to be in fair control. RECOMMENDATIONS include referral to neurology and continue present medicines and treatment.     Acute non-recurrent maxillary sinusitis       Relevant Medications   amoxicillin (AMOXIL) 875 MG tablet   fluconazole (DIFLUCAN) 150 MG tablet Patient has maxillary sinusitis , treat with amoxil    RUQ pain       Relevant Orders   [place]dom[place] Limited RUQ (LIVER/GB) RUQ pain, needs labs and RUQ ultrasound for gall bladder disease         Follow-up: Return in about 2 weeks (around 05/15/2021).    LReinaldo Meeker MD

## 2021-05-01 NOTE — Telephone Encounter (Signed)
   Laura [name] has been scheduled for the following appointment:  WHAT: ULTRASOUND WHERE: [place] DATE: 05/02/21 TIME: 10:30AM ARRIVAL TIME  Patient has been made aware.

## 2021-05-01 NOTE — Assessment & Plan Note (Signed)
Sees psychiatrist in [place]

## 2021-05-02 DIAGNOSIS — R1011 Right upper quadrant pain: Secondary | ICD-10-CM | POA: Diagnosis not present

## 2021-05-02 LAB — TSH: TSH: 4.02 u[IU]/mL (ref 0.450–4.500)

## 2021-05-02 LAB — T3, FREE: T3, Free: 2.3 pg/mL (ref 2.0–4.4)

## 2021-05-02 LAB — CBC WITH DIFFERENTIAL/PLATELET
Basophils Absolute: 0 10*3/uL (ref 0.0–0.2)
Basos: 1 %
EOS (ABSOLUTE): 0.1 10*3/uL (ref 0.0–0.4)
Eos: 2 %
Hematocrit: 41.5 % (ref 34.0–46.6)
Hemoglobin: 13.1 g/dL (ref 11.1–15.9)
Immature Grans (Abs): 0 10*3/uL (ref 0.0–0.1)
Immature Granulocytes: 0 %
Lymphocytes Absolute: 2.3 10*3/uL (ref 0.7–3.1)
Lymphs: 41 %
MCH: 27.5 pg (ref 26.6–33.0)
MCHC: 31.6 g/dL (ref 31.5–35.7)
MCV: 87 fL (ref 79–97)
Monocytes Absolute: 0.5 10*3/uL (ref 0.1–0.9)
Monocytes: 9 %
Neutrophils Absolute: 2.7 10*3/uL (ref 1.4–7.0)
Neutrophils: 47 %
Platelets: 313 10*3/uL (ref 150–450)
RBC: 4.76 x10E6/uL (ref 3.77–5.28)
RDW: 13.3 % (ref 11.7–15.4)
WBC: 5.7 10*3/uL (ref 3.4–10.8)

## 2021-05-02 LAB — COMPREHENSIVE METABOLIC PANEL
ALT: 15 IU/L (ref 0–32)
AST: 18 IU/L (ref 0–40)
Albumin/Globulin Ratio: 1.9 (ref 1.2–2.2)
Albumin: 4.4 g/dL (ref 3.8–4.9)
Alkaline Phosphatase: 62 IU/L (ref 44–121)
BUN/Creatinine Ratio: 28 — ABNORMAL HIGH (ref 9–23)
BUN: 21 mg/dL (ref 6–24)
Bilirubin Total: 0.4 mg/dL (ref 0.0–1.2)
CO2: 28 mmol/L (ref 20–29)
Calcium: 10.1 mg/dL (ref 8.7–10.2)
Chloride: 99 mmol/L (ref 96–106)
Creatinine, Ser: 0.75 mg/dL (ref 0.57–1.00)
Globulin, Total: 2.3 g/dL (ref 1.5–4.5)
Glucose: 99 mg/dL (ref 65–99)
Potassium: 4.5 mmol/L (ref 3.5–5.2)
Sodium: 140 mmol/L (ref 134–144)
Total Protein: 6.7 g/dL (ref 6.0–8.5)
eGFR: 94 mL/min/{1.73_m2} (ref >59)

## 2021-05-02 LAB — HEMOGLOBIN A1C
Est. average glucose Bld gHb Est-mCnc: 123 mg/dL
Hgb A1c MFr Bld: 5.9 % — ABNORMAL HIGH (ref 4.8–5.6)

## 2021-05-02 LAB — T4, FREE: Free T4: 1.49 ng/dL (ref 0.82–1.77)

## 2021-05-03 NOTE — Progress Notes (Signed)
A1c 5.9 prediabetes, TSH 4.20 normal, CBC normal, Kidney and liver tests normal, T3 free normal, free T4 normal,  lp

## 2021-05-07 ENCOUNTER — Other Ambulatory Visit: Payer: Self-pay | Admitting: Family Medicine

## 2021-05-07 ENCOUNTER — Other Ambulatory Visit: Payer: Self-pay | Admitting: Legal Medicine

## 2021-05-07 DIAGNOSIS — R1011 Right upper quadrant pain: Secondary | ICD-10-CM

## 2021-05-07 DIAGNOSIS — E039 Hypothyroidism, unspecified: Secondary | ICD-10-CM

## 2021-05-07 NOTE — Progress Notes (Signed)
lia

## 2021-05-13 DIAGNOSIS — R1011 Right upper quadrant pain: Secondary | ICD-10-CM | POA: Diagnosis not present

## 2021-05-26 DIAGNOSIS — N261 Atrophy of kidney (terminal): Secondary | ICD-10-CM | POA: Diagnosis not present

## 2021-05-26 DIAGNOSIS — R600 Localized edema: Secondary | ICD-10-CM | POA: Diagnosis not present

## 2021-05-26 DIAGNOSIS — Z87442 Personal history of urinary calculi: Secondary | ICD-10-CM | POA: Diagnosis not present

## 2021-05-26 DIAGNOSIS — I10 Essential (primary) hypertension: Secondary | ICD-10-CM | POA: Diagnosis not present

## 2021-06-01 DIAGNOSIS — R600 Localized edema: Secondary | ICD-10-CM | POA: Diagnosis not present

## 2021-06-01 DIAGNOSIS — I10 Essential (primary) hypertension: Secondary | ICD-10-CM | POA: Diagnosis not present

## 2021-06-01 DIAGNOSIS — N261 Atrophy of kidney (terminal): Secondary | ICD-10-CM | POA: Diagnosis not present

## 2021-06-04 ENCOUNTER — Telehealth: Payer: Self-pay

## 2021-06-04 NOTE — Chronic Care Management (AMB) (Signed)
Chronic Care Management Pharmacy Assistant   Name: Laura [name]  MRN: [id number] DOB: 03-Sep-1966  Reason for Encounter: Hypertension  Disease State Call  Recent office visits:  05/01/21- Laura Meeker, MD- [name] for hypertension,  short course amoxicillin 875 mg x 10 days, short course fluconazole 150 mg x 7 days, referral to neurology, labs ordered, follow up 2 weeks  Recent consult visits:  No visits noted  Hospital visits:  None in previous 6 months  Medications: Outpatient Encounter Medications as of 06/04/2021  Medication Sig   albuterol (VENTOLIN HFA) 108 (90 Base) MCG/ACT inhaler Inhale 2 puffs into the lungs every 4 (four) hours as needed for wheezing or shortness of breath.   ALPRAZolam (XANAX) 1 MG tablet Take 1 mg by mouth 4 (four) times daily.    amphetamine-dextroamphetamine (ADDERALL) 30 MG tablet Take 30 mg by mouth 3 (three) times daily.    Blood Glucose Monitoring Suppl (ACCU-CHEK GUIDE) w/Device KIT 1 each by Does not apply route daily.   EPINEPHrine 0.3 mg/0.3 mL IJ SOAJ injection Inject 0.3 mLs (0.3 mg total) into the muscle as needed for anaphylaxis.   fluconazole (DIFLUCAN) 150 MG tablet Take 1 tablet (150 mg total) by mouth daily.   furosemide (LASIX) 40 MG tablet Take 40 mg by mouth 2 (two) times daily.   glucose blood (ACCU-CHEK GUIDE) test strip 1 each by Other route daily in the afternoon. Use as instructed   hydrochlorothiazide (HYDRODIURIL) 25 MG tablet Take 25 mg by mouth as needed.   hydrOXYzine (VISTARIL) 100 MG capsule Take 1 capsule (100 mg total) by mouth 3 (three) times daily as needed for itching.   levothyroxine (SYNTHROID) 112 MCG tablet TAKE 1 TABLET BY MOUTH  DAILY   Potassium Chloride CRYS    potassium chloride SA (KLOR-CON) 20 MEQ tablet TAKE 1 TABLET BY MOUTH  DAILY   quinapril (ACCUPRIL) 20 MG tablet TAKE 1 TABLET BY MOUTH  TWICE DAILY   Semaglutide,0.25 or 0.5MG/DOS, (OZEMPIC, 0.25 OR 0.5 MG/DOSE,) 2 MG/1.5ML SOPN Inject 0.5 mg into  the skin once a week.   tiZANidine (ZANAFLEX) 4 MG tablet Take 4 mg by mouth 3 (three) times daily as needed.    valACYclovir (VALTREX) 1000 MG tablet Take 1 tablet (1,000 mg total) by mouth 2 (two) times daily as needed.   zolpidem (AMBIEN) 10 MG tablet Take 10 mg by mouth at bedtime.   No facility-administered encounter medications on file as of 06/04/2021.    Recent Office Vitals: BP Readings from Last 3 Encounters:  05/01/21 100/80  02/03/21 (!) 136/98  02/03/21 122/68   Pulse Readings from Last 3 Encounters:  05/01/21 (!) 56  02/03/21 64  02/03/21 88    Wt Readings from Last 3 Encounters:  05/01/21 163 lb (73.9 kg)  02/03/21 163 lb (73.9 kg)  02/03/21 164 lb (74.4 kg)     Kidney Function Lab Results  Component Value Date/Time   CREATININE 0.75 05/01/2021 10:36 AM   CREATININE 0.74 02/03/2021 08:05 AM   CREATININE 0.88 06/20/2019 09:08 AM   CREATININE 0.86 07/10/2018 04:03 PM   GFRNONAA 75 05/19/2020 02:54 PM   GFRNONAA 75 06/20/2019 09:08 AM   GFRAA 86 05/19/2020 02:54 PM   GFRAA 87 06/20/2019 09:08 AM    BMP Latest Ref Rng & Units 05/01/2021 02/03/2021 07/24/2020  Glucose 65 - 99 mg/dL 99 97 -  BUN 6 - 24 mg/dL 21 25(H) 23(A)  Creatinine 0.57 - 1.00 mg/dL 0.75 0.74 1.0  BUN/Creat Ratio  9 - 23 28(H) 34(H) -  Sodium 134 - 144 mmol/L 140 143 -  Potassium 3.5 - 5.2 mmol/L 4.5 4.8 -  Chloride 96 - 106 mmol/L 99 103 -  CO2 20 - 29 mmol/L 28 24 -  Calcium 8.7 - 10.2 mg/dL 10.1 9.9 -   06/22/21- I spoke with Laura [name] this morning and she reports being in extreme pain from her fibromyalgia the past couple of weeks. She stated that her body consistently feels like pins and needles at night and bad pains during the day. She is unable to take NSAIDs due to CKD and gastro issues so she just uses ice to relieve any topical pains. She has had steroidal injections and been to pain management, but stated neither have worked for different reasons. She stated that she is open to  revisiting pain management if she was [name] by another provider. Laura [name] reported that her extreme bouts of pain may be the cause for her high BP readings as of late. She stated that she has been having greater than normal swelling and knee pain. She also experiences headaches, heart pounding, and feelings of malaise before her evening dose of quinapril. Laura [name] would like further guidance on what measures should be taken to help symptoms and  BP readings.   06/10/21- Laura [name] was due for a HTN assesment, but did not have any readings so we will complete the call next week. She did however have some complaints about her Ozempic. She was taking her Ozempic on Sundays and with each dose, after two days, she started noticing symptoms of fatigue, insomnia, chronic headaches, and fibro flare ups. Two weeks ago she decided to stop taking it completely and reports no longer having symptoms. Her BS has been consistently in the 90-101 range and she denies hypo/hyperglycemic symptoms. She would liketo know what you recommend she do for her regimen.   Current antihypertensive regimen:  furosemide 40 mg twice daily Hydrochlorothiazide 25 mg daily prn Quinapril 20 mg bid Potassium 20 meq daily  Patient verbally confirms she is taking the above medications as directed. Yes  How often are you checking your Blood Pressure?  Patient stated she checks her BP daily  she checks her blood pressure in the morning before taking her medication.  Current home BP readings:  135/95, 147/96 ,140/89 132/82 150/101  DATE:             BP               PULSE 08/22   114/80   Wrist or arm cuff: arm Caffeine intake: daily  Salt intake: little to none OTC medications including pseudoephedrine or NSAIDs? None  Any readings above 180/120? No If yes any symptoms of hypertensive emergency? chest pain and back pain   What recent interventions/DTPs have been made by any provider to improve Blood Pressure control since  last CPP Visit:  No recent interventions  Any recent hospitalizations or ED visits since last visit with CPP? No  What diet changes have been made to improve Blood Pressure Control?  Patient stated her diet has remained consistent and unchanged  What exercise is being done to improve your Blood Pressure Control?  Patient stated she remains active by working at [employer]  Adherence Review: Is the patient currently on ACE/ARB medication? Yes Does the patient have >5 day gap between last estimated fill dates? Yes Confirmed by [facility] furosemide 40 mg - 90 DS last filled 06/03/21 Patient has  been receiving 20 mg  Hydrochlorothiazide 25 mg- 90 DS last filled 12/05/20 No script on file, patient confirmed on hand supply Potassium 20 meq  - 90 DS last filled 05/10/21  Care Gaps: Last annual wellness visit? 05/19/20  Star Rating Drugs:  Medication:  Last Fill: Day Supply Quinapril 20 mg          05/10/21           90 DS Ozempic                      01/27/21            28 DS  [facility] CPA, CMA

## 2021-06-17 ENCOUNTER — Ambulatory Visit: Payer: Medicare Other | Admitting: Neurology

## 2021-06-26 NOTE — Chronic Care Management (AMB) (Signed)
    Chronic Care Management Pharmacy Assistant   Name: Laura [name]  MRN: [id number] DOB: 01-Aug-1966  Reason for Encounter: Chart Review  Medications: Outpatient Encounter Medications as of 06/04/2021  Medication Sig   albuterol (VENTOLIN HFA) 108 (90 Base) MCG/ACT inhaler Inhale 2 puffs into the lungs every 4 (four) hours as needed for wheezing or shortness of breath.   ALPRAZolam (XANAX) 1 MG tablet Take 1 mg by mouth 4 (four) times daily.    amphetamine-dextroamphetamine (ADDERALL) 30 MG tablet Take 30 mg by mouth 3 (three) times daily.    Blood Glucose Monitoring Suppl (ACCU-CHEK GUIDE) w/Device KIT 1 each by Does not apply route daily.   EPINEPHrine 0.3 mg/0.3 mL IJ SOAJ injection Inject 0.3 mLs (0.3 mg total) into the muscle as needed for anaphylaxis.   fluconazole (DIFLUCAN) 150 MG tablet Take 1 tablet (150 mg total) by mouth daily.   furosemide (LASIX) 40 MG tablet Take 40 mg by mouth 2 (two) times daily.   glucose blood (ACCU-CHEK GUIDE) test strip 1 each by Other route daily in the afternoon. Use as instructed   hydrochlorothiazide (HYDRODIURIL) 25 MG tablet Take 25 mg by mouth as needed.   hydrOXYzine (VISTARIL) 100 MG capsule Take 1 capsule (100 mg total) by mouth 3 (three) times daily as needed for itching.   levothyroxine (SYNTHROID) 112 MCG tablet TAKE 1 TABLET BY MOUTH  DAILY   Potassium Chloride CRYS    potassium chloride SA (KLOR-CON) 20 MEQ tablet TAKE 1 TABLET BY MOUTH  DAILY   quinapril (ACCUPRIL) 20 MG tablet TAKE 1 TABLET BY MOUTH  TWICE DAILY   Semaglutide,0.25 or 0.5MG/DOS, (OZEMPIC, 0.25 OR 0.5 MG/DOSE,) 2 MG/1.5ML SOPN Inject 0.5 mg into the skin once a week.   tiZANidine (ZANAFLEX) 4 MG tablet Take 4 mg by mouth 3 (three) times daily as needed.    valACYclovir (VALTREX) 1000 MG tablet Take 1 tablet (1,000 mg total) by mouth 2 (two) times daily as needed.   zolpidem (AMBIEN) 10 MG tablet Take 10 mg by mouth at bedtime.   No facility-administered encounter  medications on file as of 06/04/2021.   Reviewed chart for medication changes and adherence.  No OVs, Consults, or hospital visits since last care coordination call / Pharmacist visit. No medication changes indicated  No gaps in adherence identified. Patient has follow up scheduled with pharmacy team. No further action required.  Wilford Sports CPA, CMA

## 2021-07-03 ENCOUNTER — Telehealth: Payer: Self-pay

## 2021-07-03 NOTE — Telephone Encounter (Signed)
I left voicemail to call us back to set up an appointment for AWV.

## 2021-07-17 NOTE — Telephone Encounter (Signed)
Follow up phone call to schedule AWV. No answer, left msg with office number to call back to schedule

## 2021-07-29 ENCOUNTER — Ambulatory Visit: Payer: Medicare Other | Admitting: Neurology

## 2021-08-03 ENCOUNTER — Telehealth: Payer: Self-pay

## 2021-08-03 NOTE — Chronic Care Management (AMB) (Signed)
Chronic Care Management Pharmacy Assistant   Name: Laura [name] Laura [name] [name]  MRN: [id number] DOB: July 03, 1966   Reason for Encounter: Disease State call for HTN   Recent office visits:  None since 06/04/21  Recent consult visits:  None since 06/04/21  Hospital visits:  None since 06/04/21  Medications: Outpatient Encounter Medications as of 08/03/2021  Medication Sig   albuterol (VENTOLIN HFA) 108 (90 Base) MCG/ACT inhaler Inhale 2 puffs into the lungs every 4 (four) hours as needed for wheezing or shortness of breath.   ALPRAZolam (XANAX) 1 MG tablet Take 1 mg by mouth 4 (four) times daily.    amphetamine-dextroamphetamine (ADDERALL) 30 MG tablet Take 30 mg by mouth 3 (three) times daily.    Blood Glucose Monitoring Suppl (ACCU-CHEK GUIDE) w/Device KIT 1 each by Does not apply route daily.   EPINEPHrine 0.3 mg/0.3 mL IJ SOAJ injection Inject 0.3 mLs (0.3 mg total) into the muscle as needed for anaphylaxis.   fluconazole (DIFLUCAN) 150 MG tablet Take 1 tablet (150 mg total) by mouth daily.   furosemide (LASIX) 40 MG tablet Take 40 mg by mouth 2 (two) times daily.   glucose blood (ACCU-CHEK GUIDE) test strip 1 each by Other route daily in the afternoon. Use as instructed   hydrochlorothiazide (HYDRODIURIL) 25 MG tablet Take 25 mg by mouth as needed.   hydrOXYzine (VISTARIL) 100 MG capsule Take 1 capsule (100 mg total) by mouth 3 (three) times daily as needed for itching.   levothyroxine (SYNTHROID) 112 MCG tablet TAKE 1 TABLET BY MOUTH  DAILY   Potassium Chloride CRYS    potassium chloride SA (KLOR-CON) 20 MEQ tablet TAKE 1 TABLET BY MOUTH  DAILY   quinapril (ACCUPRIL) 20 MG tablet TAKE 1 TABLET BY MOUTH  TWICE DAILY   Semaglutide,0.25 or 0.5MG/DOS, (OZEMPIC, 0.25 OR 0.5 MG/DOSE,) 2 MG/1.5ML SOPN Inject 0.5 mg into the skin once a week.   tiZANidine (ZANAFLEX) 4 MG tablet Take 4 mg by mouth 3 (three) times daily as needed.    valACYclovir (VALTREX) 1000 MG tablet Take 1 tablet (1,000 mg total)  by mouth 2 (two) times daily as needed.   zolpidem (AMBIEN) 10 MG tablet Take 10 mg by mouth at bedtime.   No facility-administered encounter medications on file as of 08/03/2021.    Recent Office Vitals: BP Readings from Last 3 Encounters:  05/01/21 100/80  02/03/21 (!) 136/98  02/03/21 122/68   Pulse Readings from Last 3 Encounters:  05/01/21 (!) 56  02/03/21 64  02/03/21 88    Wt Readings from Last 3 Encounters:  05/01/21 163 lb (73.9 kg)  02/03/21 163 lb (73.9 kg)  02/03/21 164 lb (74.4 kg)     Kidney Function Lab Results  Component Value Date/Time   CREATININE 0.75 05/01/2021 10:36 AM   CREATININE 0.74 02/03/2021 08:05 AM   CREATININE 0.88 06/20/2019 09:08 AM   CREATININE 0.86 07/10/2018 04:03 PM   GFRNONAA 75 05/19/2020 02:54 PM   GFRNONAA 75 06/20/2019 09:08 AM   GFRAA 86 05/19/2020 02:54 PM   GFRAA 87 06/20/2019 09:08 AM    BMP Latest Ref Rng & Units 05/01/2021 02/03/2021 07/24/2020  Glucose 65 - 99 mg/dL 99 97 -  BUN 6 - 24 mg/dL 21 25(H) 23(A)  Creatinine 0.57 - 1.00 mg/dL 0.75 0.74 1.0  BUN/Creat Ratio 9 - 23 28(H) 34(H) -  Sodium 134 - 144 mmol/L 140 143 -  Potassium 3.5 - 5.2 mmol/L 4.5 4.8 -  Chloride 96 - 106 mmol/L  99 103 -  CO2 20 - 29 mmol/L 28 24 -  Calcium 8.7 - 10.2 mg/dL 10.1 9.9 -     Current antihypertensive regimen:  Quinapril 20 mg twice daily  Lasix 40 mg 2 times daily    How often are you checking your Blood Pressure? daily  she checks her blood pressure at random times  Current home BP readings: Ranging (142/90)  Wrist or arm cuff:Wrist  Caffeine intake: 1 cup of coffee Salt intake:Decreased  OTC medications including pseudoephedrine or NSAIDs? No, pt cannot take Nsaids  Any readings above 180/120? No   What recent interventions/DTPs have been made by any provider to improve Blood Pressure control since last CPP Visit: Pt stated she is not taking the HCTZ but is taking the Lasix 40 mg now. Pt took herself off the Adderall but  did state it never messed with her BP.  Any recent hospitalizations or ED visits since last visit with CPP? No recent visits  What diet changes have been made to improve Blood Pressure Control?  Pt stated nothing has changed   Adherence Review: Is the patient currently on ACE/ARB medication? Yes Does the patient have >5 day gap between last estimated fill dates? No  Care Gaps: Last annual wellness visit? None noted   Star Rating Drugs: Pt gets medications through mail order Medication:  Last Fill: Day Supply Quinapril   08/01/21 [facility]ds Ozempic   08/01/21 [place]

## 2021-08-04 NOTE — Progress Notes (Signed)
Subjective:  Patient ID: Laura [name], female    DOB: 12/21/1965  Age: 55 y.o. MRN: [id number]  Chief Complaint  Patient presents with   Hypertension   Hyperlipidemia    HPI Patient is a 55 year old white female with past medical history significant for hyperlipidemia, prediabetes, hypothyroidism, fibromyalgia, generalized anxiety disorder, panic disorder, asthma, and insomnia.  Patient has been sick for 3 weeks.  She is on numerous COVID tests last one was last week and was negative.  Her symptoms include runny nose, stuffy nose, cough, shortness of breath and chest tightness.  Denies fevers or chills.  Patient has not been taking any over-the-counter medications.  She is very sensitive to medications.  Prediabetes: Patient checks her sugars every other day approximately.  Her sugars are 90-99.  She eats very healthy on the 56 daily diet.  She walks for exercise.  Hyperlipidemia: Patient has tried Lipitor, Zocor, Crestor, pravastatin all of which she did not tolerate.  All caused muscle pain.  She did try an injectable medication (likely Repatha) but it also made her feel poorly.  Patient has chronic headaches.  Some of them appear to be migraines with an aura.  Patient has an appointment to see neurology coming up.  Depression/GAD/panic disorder: Patient sees Dr. Chucky May in [place].  Currently is on Xanax 1 mg 4 times a day.  She has no other medications however she has been tried on Cymbalta before as well as numerous other medicine she says.  Insomnia: Currently on Ambien 10 mg once at bedtime.  Patient is complaining that this does not work well.  She has tried melatonin however this causes wild dreams.  Fibromyalgia: Currently on Zanaflex 4 mg 3 times daily as needed.  Patient has been intolerant to gabapentin and Cymbalta.  She has not tried Lyrica.  She tried amitriptyline in the past but does not recall what side effects she had.  Patient has not tried  Administrator, [employer].  Hypertension: Patient is currently on quinapril 20 mg twice daily.  She also has hydrochlorothiazide which she takes 25 mg once daily as needed.  She takes Lasix 40 mg twice a day for edema.   Current Outpatient Medications on File Prior to Visit  Medication Sig Dispense Refill   albuterol (VENTOLIN HFA) 108 (90 Base) MCG/ACT inhaler Inhale 2 puffs into the lungs every 4 (four) hours as needed for wheezing or shortness of breath. 18 g 3   ALPRAZolam (XANAX) 1 MG tablet Take 1 mg by mouth 4 (four) times daily.      Blood Glucose Monitoring Suppl (ACCU-CHEK GUIDE) w/Device KIT 1 each by Does not apply route daily. 1 kit 0   EPINEPHrine 0.3 mg/0.3 mL IJ SOAJ injection Inject 0.3 mLs (0.3 mg total) into the muscle as needed for anaphylaxis. 1 each 2   furosemide (LASIX) 40 MG tablet Take 40 mg by mouth 2 (two) times daily.     glucose blood (ACCU-CHEK GUIDE) test strip 1 each by Other route daily in the afternoon. Use as instructed 100 each 12   hydrochlorothiazide (HYDRODIURIL) 25 MG tablet Take 25 mg by mouth as needed.     hydrOXYzine (VISTARIL) 100 MG capsule Take 1 capsule (100 mg total) by mouth 3 (three) times daily as needed for itching. 30 capsule 0   levothyroxine (SYNTHROID) 112 MCG tablet TAKE 1 TABLET BY MOUTH  DAILY 90 tablet 3   Potassium Chloride CRYS      potassium chloride SA (KLOR-CON) 20 MEQ tablet  TAKE 1 TABLET BY MOUTH  DAILY 90 tablet 2   quinapril (ACCUPRIL) 20 MG tablet TAKE 1 TABLET BY MOUTH  TWICE DAILY 180 tablet 2   Semaglutide,0.25 or 0.5MG/DOS, (OZEMPIC, 0.25 OR 0.5 MG/DOSE,) 2 MG/1.5ML SOPN Inject 0.5 mg into the skin once a week. 1.5 mL 0   tiZANidine (ZANAFLEX) 4 MG tablet Take 4 mg by mouth 3 (three) times daily as needed.   0   valACYclovir (VALTREX) 1000 MG tablet Take 1 tablet (1,000 mg total) by mouth 2 (two) times daily as needed. 20 tablet 0   zolpidem (AMBIEN) 10 MG tablet Take 10 mg by mouth at bedtime.     No current facility-administered  medications on file prior to visit.   Past Medical History:  Diagnosis Date   Acute GI bleeding 09/01/2019   Anxiety and depression    Bowel obstruction ([place])    Chest pain    a. 01/2016 Ex MV: Hypertensive response. Freq PVCs w/ exercise. nl EF. No ST/T changes. No ischemia.   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 03/2016 Echo: EF 60-65%, no rwma, mild MR, nl LA size, nl RV fxn.   High cholesterol    Kidney stones    Nasal septal perforation 05/12/2018   Hx of cocaine use   Palpitations    a. 03/2016 Holter: Sinus rhythm, avg HR 83, max 123, min 64. 4 PACs. 10,356 isolated PVCs, one vent couplet, 3842 V bigeminy, 4 beats NSVT->prev on BB - dc 2/2 swelling.   Prediabetes 12/23/2015   Overview:  Hba1c higher but not diabetic. Took metformin to try to lessen   Raynaud disease    Rectocele    Sleep apnea    "mild" per pt   Torn rotator cuff    left   Urinary retention with incomplete bladder emptying    Vaginal dryness, menopausal    Vaginal enterocele    Vitamin D deficiency 12/03/2014   Wears hearing aid in both ears    Past Surgical History:  Procedure Laterality Date   29 HOUR [place] STUDY N/A 10/11/2018   Procedure: 24 HOUR [place] STUDY;  Surgeon: Mauri Pole, MD;  Location: [facility];  Service: Endoscopy;  Laterality: N/A;   ABDOMINAL HYSTERECTOMY     ANKLE SURGERY     ran over by mother in car by [place]  09/02/2019   Procedure: BIOPSY;  Surgeon: Rush Landmark Telford Nab., MD;  Location: [facility];  Service: Gastroenterology;;   COLONOSCOPY     COLONOSCOPY WITH PROPOFOL N/A 09/02/2019   Procedure: COLONOSCOPY WITH PROPOFOL;  Surgeon: Irving Copas., MD;  Location: [facility];  Service: Gastroenterology;  Laterality: N/A;   COLPORRHAPHY  2015   posterior and enterocele ligation   CYSTOSCOPY  04/11/2017   Procedure: CYSTOSCOPY;  Surgeon:  Defrancesco, Alanda Slim, MD;  Location: [facility];  Service: Gynecology;;   ESOPHAGEAL MANOMETRY N/A 10/11/2018   Procedure: ESOPHAGEAL MANOMETRY (EM);  Surgeon: Mauri Pole, MD;  [facility]: [facility];  Service: Endoscopy;  Laterality: N/A;   ESOPHAGOGASTRODUODENOSCOPY (EGD) WITH PROPOFOL N/A 09/02/2019   Procedure: ESOPHAGOGASTRODUODENOSCOPY (EGD) WITH PROPOFOL;  Surgeon: Rush Landmark Telford Nab., MD;  Location: [facility];  Service: Gastroenterology;  Laterality: N/A;   EXTRACORPOREAL SHOCK WAVE LITHOTRIPSY Left 07/31/2020   Procedure: EXTRACORPOREAL SHOCK WAVE LITHOTRIPSY (ESWL);  Surgeon:  Billey Co, MD;  Location: [facility];  Service: Urology;  Laterality: Left;   KNEE ARTHROSCOPY WITH MEDIAL MENISECTOMY Right 02/04/2020   Procedure: KNEE ARTHROSCOPY WITH PARTIAL LATERAL AND MEDIAL MENISECTOMY,  PARTIAL SYNOVECTOMY AND CHONDROPLASTY;  Surgeon: Lovell Sheehan, MD;  Location: [facility];  Service: Orthopedics;  Laterality: Right;   LAPAROSCOPIC SALPINGO OOPHERECTOMY Left 04/11/2017   Procedure: LAPAROSCOPIC LEFT SALPINGO OOPHORECTOMY;  Surgeon: Brayton Mars, MD;  Location: [facility];  Service: Gynecology;  Laterality: Left;   LITHOTRIPSY     OOPHORECTOMY     PARTIAL HYSTERECTOMY     [place] IMPEDANCE STUDY N/A 10/11/2018   Procedure: [place] IMPEDANCE STUDY;  Surgeon: Mauri Pole, MD;  Location: [facility];  Service: Endoscopy;  Laterality: N/A;   PVC ABLATION N/A 01/18/2020   Procedure: PVC ABLATION;  Surgeon: Thompson Grayer, MD;  Location: [facility];  Service: Cardiovascular;  Laterality: N/A;   thumb surgery     UPPER GASTROINTESTINAL ENDOSCOPY      Family History  Problem Relation Age of Onset   Stroke Father    Diabetes Father    Breast cancer Mother 71   Diverticulitis Mother    Esophageal cancer Maternal Grandfather    Colon cancer Paternal Grandmother    Suicidality Brother    Ovarian cancer Neg Hx    Stomach cancer Neg Hx    Social History    Socioeconomic History   Marital status: Legally Separated    Spouse name: Not on file   Number of children: 4   Years of education: Not on file   Highest education level: Some college, no degree  Occupational History   Occupation: Disabled  Tobacco Use   Smoking status: Former    Types: Cigarettes    Quit date: 04/08/1995    Years since quitting: 26.3   Smokeless tobacco: Never  Vaping Use   Vaping Use: Never used  Substance and Sexual Activity   Alcohol use: Not Currently    Alcohol/week: 1.0 standard drink    Types: 1 Glasses of wine per week    Comment: rare; once every 6 months   Drug use: No   Sexual activity: Yes    Partners: Male    Birth control/protection: Surgical  Other Topics Concern   Not on file  Social History Narrative   Separated - 1 son and 1 daughter   Disabled    1 caffeine/day   Past smoker   No EtOH, drugs      08/02/2018      Social Determinants of Health   Financial Resource Strain: Low Risk    Difficulty of Paying Living Expenses: Not hard at all  Food Insecurity: No Food Insecurity   Worried About Charity fundraiser in the Last Year: Never true   Ran Out of Food in the Last Year: Never true  Transportation Needs: No Transportation Needs   Lack of Transportation (Medical): No   Lack of Transportation (Non-Medical): No  Physical Activity: Sufficiently Active   Days of Exercise per Week: 6 days   Minutes of Exercise per Session: 60 min  Stress: No Stress Concern Present   Feeling of Stress : Not at all  Social Connections: Not on file    Review of Systems  Constitutional:  Positive for fatigue. Negative for chills and fever.  HENT:  Positive for congestion and rhinorrhea. Negative for sore throat.   Eyes:  Positive for visual disturbance.  Respiratory:  Positive for cough  and shortness of breath.   Cardiovascular:  Positive for chest pain.  Gastrointestinal:  Positive for abdominal pain. Negative for constipation, diarrhea, nausea  and vomiting.  Endocrine: Positive for polydipsia and polyphagia.  Genitourinary:  Positive for dysuria. Negative for urgency.  Musculoskeletal:  Positive for arthralgias, back pain and myalgias.  Neurological:  Positive for weakness and headaches. Negative for dizziness and light-headedness.  Psychiatric/Behavioral:  Negative for dysphoric mood. The patient is not nervous/anxious.     Objective:  BP 140/78   Pulse 72   Temp (!) 97.5 F (36.4 C)   Resp 16   Ht 5' 4" (1.626 m)   Wt 168 lb (76.2 kg)   BMI 28.84 kg/m   BP/Weight 08/05/2021 12/11/4763 02/04/5034  Systolic BP 465 681 275  Diastolic BP 78 80 68  Wt. (Lbs) 168 163 164  BMI 28.84 27.98 28.15    Physical Exam Vitals reviewed.  Constitutional:      General: She is not in acute distress.    Appearance: Normal appearance. She is normal weight.  HENT:     Right Ear: Tympanic membrane and ear canal normal.     Left Ear: Tympanic membrane and ear canal normal.     Nose: Congestion present. No rhinorrhea.     Comments: Sinus tenderness bilaterally.  Erythematous turbinates. Eyes:     Conjunctiva/sclera: Conjunctivae normal.  Neck:     Thyroid: No thyroid mass.     Vascular: No carotid bruit.  Cardiovascular:     Rate and Rhythm: Normal rate and regular rhythm.     Pulses: Normal pulses.     Heart sounds: Normal heart sounds. No murmur heard. Pulmonary:     Effort: Pulmonary effort is normal.     Breath sounds: Normal breath sounds.  Abdominal:     General: Bowel sounds are normal.     Palpations: Abdomen is soft. There is no mass.     Tenderness: There is no abdominal tenderness (Generalized.).  Musculoskeletal:        General: Tenderness (Multiple fibromyalgia trigger points.) present.  Lymphadenopathy:     Cervical: No cervical adenopathy.  Skin:    General: Skin is warm and dry.  Neurological:     Mental Status: She is alert and oriented to person, place, and time.     Cranial Nerves: No cranial nerve  deficit.  Psychiatric:        Mood and Affect: Mood normal.        Behavior: Behavior normal.    Diabetic Foot Exam - Simple   No data filed      Lab Results  Component Value Date   WBC 4.5 08/05/2021   HGB 13.5 08/05/2021   HCT 41.4 08/05/2021   PLT 325 08/05/2021   GLUCOSE 107 (H) 08/05/2021   CHOL 288 (H) 08/05/2021   TRIG 117 08/05/2021   HDL 69 08/05/2021   LDLDIRECT 222 (H) 04/26/2019   LDLCALC 199 (H) 08/05/2021   ALT 16 08/05/2021   AST 17 08/05/2021   NA 144 08/05/2021   K 4.1 08/05/2021   CL 102 08/05/2021   CREATININE 0.75 08/05/2021   BUN 22 08/05/2021   CO2 26 08/05/2021   TSH 4.020 05/01/2021   INR 0.9 08/11/2017   HGBA1C 5.8 (H) 08/05/2021      Assessment & Plan:   Problem List Items Addressed This Visit       Cardiovascular and Mediastinum   Essential hypertension - Primary    The current medical  regimen is effective;  continue present plan and medications. Continue lisinopril 20 mg bid.       Relevant Orders   CBC with Differential (Completed)   Comprehensive metabolic panel (Completed)     Respiratory   Moderate persistent asthma    Start breztri 2 puffs twice a day.  Start albuterol hfa 2 puffs qid prn.       Relevant Medications   albuterol (PROVENTIL) (2.5 MG/3ML) 0.083% nebulizer solution 2.5 mg   Acute infection of nasal sinus    zpack sent.       Relevant Medications   azithromycin (ZITHROMAX) 250 MG tablet   Sleep apnea    No wearing cpap.         Musculoskeletal and Integument   Drug-induced myopathy     Other   Fibromyalgia (Chronic)    Continue tizanidine.        Mixed hyperlipidemia    Intolerant to statins and repatha.  Recommend zetia.      Relevant Orders   Lipid Panel (Completed)   Dysuria    Urine normal. If worsens, may return for repeat UA.       Relevant Orders   POCT urinalysis dipstick (Completed)   Overweight with body mass index (BMI) of 28 to 28.9 in adult    Trial on phentermine  15 mg once daily. Follow up in 1 month      Prediabetes   Relevant Orders   Hemoglobin A1c (Completed)   Other Visit Diagnoses     High risk heterosexual behavior       Relevant Orders   HSV(herpes simplex vrs) 1+2 ab-IgG (Completed)   HCV Ab w Reflex to Quant PCR (Completed)   HIV Antibody (routine testing w rflx) (Completed)   Ct, Ng, Mycoplasmas NAA, Swab     .  Meds ordered this encounter  Medications   albuterol (PROVENTIL) (2.5 MG/3ML) 0.083% nebulizer solution 2.5 mg   triamcinolone acetonide (KENALOG-40) injection 80 mg   azithromycin (ZITHROMAX) 250 MG tablet    Sig: 2 DAILY FOR FIRST DAY, THEN DECREASE TO ONE DAILY FOR 4 MORE DAYS.    Dispense:  6 tablet    Refill:  0   phentermine 15 MG capsule    Sig: Take 1 capsule (15 mg total) by mouth every morning.    Dispense:  30 capsule    Refill:  0     Orders Placed This Encounter  Procedures   CBC with Differential   Comprehensive metabolic panel   Lipid Panel   Hemoglobin A1c   HSV(herpes simplex vrs) 1+2 ab-IgG   HCV Ab w Reflex to Quant PCR   HIV Antibody (routine testing w rflx)   Interpretation:   POCT urinalysis dipstick      Follow-up: Return in about 4 weeks (around 09/02/2021) for chronic follow up.  An After Visit Summary was printed and given to the patient.  Rochel Brome, MD [facility] [phone]

## 2021-08-05 ENCOUNTER — Ambulatory Visit: Payer: Medicare Other | Admitting: Legal Medicine

## 2021-08-05 ENCOUNTER — Ambulatory Visit (INDEPENDENT_AMBULATORY_CARE_PROVIDER_SITE_OTHER): Payer: Medicare Other | Admitting: Family Medicine

## 2021-08-05 ENCOUNTER — Other Ambulatory Visit: Payer: Self-pay

## 2021-08-05 VITALS — BP 140/78 | HR 72 | Temp 97.5°F | Resp 16 | Ht 64.0 in | Wt 168.0 lb

## 2021-08-05 DIAGNOSIS — Z7251 High risk heterosexual behavior: Secondary | ICD-10-CM

## 2021-08-05 DIAGNOSIS — R300 Dysuria: Secondary | ICD-10-CM | POA: Diagnosis not present

## 2021-08-05 DIAGNOSIS — R7303 Prediabetes: Secondary | ICD-10-CM

## 2021-08-05 DIAGNOSIS — G4730 Sleep apnea, unspecified: Secondary | ICD-10-CM | POA: Diagnosis not present

## 2021-08-05 DIAGNOSIS — Z6828 Body mass index (BMI) 28.0-28.9, adult: Secondary | ICD-10-CM

## 2021-08-05 DIAGNOSIS — M797 Fibromyalgia: Secondary | ICD-10-CM | POA: Diagnosis not present

## 2021-08-05 DIAGNOSIS — G720 Drug-induced myopathy: Secondary | ICD-10-CM

## 2021-08-05 DIAGNOSIS — J019 Acute sinusitis, unspecified: Secondary | ICD-10-CM | POA: Insufficient documentation

## 2021-08-05 DIAGNOSIS — J4541 Moderate persistent asthma with (acute) exacerbation: Secondary | ICD-10-CM | POA: Diagnosis not present

## 2021-08-05 DIAGNOSIS — E782 Mixed hyperlipidemia: Secondary | ICD-10-CM | POA: Diagnosis not present

## 2021-08-05 DIAGNOSIS — E663 Overweight: Secondary | ICD-10-CM

## 2021-08-05 DIAGNOSIS — I10 Essential (primary) hypertension: Secondary | ICD-10-CM

## 2021-08-05 DIAGNOSIS — J018 Other acute sinusitis: Secondary | ICD-10-CM | POA: Diagnosis not present

## 2021-08-05 DIAGNOSIS — J4540 Moderate persistent asthma, uncomplicated: Secondary | ICD-10-CM | POA: Diagnosis not present

## 2021-08-05 DIAGNOSIS — J0181 Other acute recurrent sinusitis: Secondary | ICD-10-CM

## 2021-08-05 LAB — POCT URINALYSIS DIPSTICK
Bilirubin, UA: NEGATIVE
Blood, UA: NEGATIVE
Glucose, UA: NEGATIVE
Ketones, UA: NEGATIVE
Leukocytes, UA: NEGATIVE
Nitrite, UA: NEGATIVE
Protein, UA: NEGATIVE
Spec Grav, UA: 1.015 (ref 1.010–1.025)
Urobilinogen, UA: NEGATIVE E.U./dL — AB
pH, UA: 6.5 (ref 5.0–8.0)

## 2021-08-05 MED ORDER — ALBUTEROL SULFATE (2.5 MG/3ML) 0.083% IN NEBU: 2.5000 mg | INHALATION_SOLUTION | RESPIRATORY_TRACT | Status: DC | PRN

## 2021-08-05 MED ORDER — PHENTERMINE HCL 15 MG PO CAPS: 15.0000 mg | ORAL_CAPSULE | ORAL | 0 refills | Status: DC

## 2021-08-05 MED ORDER — AZITHROMYCIN 250 MG PO TABS: ORAL_TABLET | ORAL | 0 refills | Status: DC

## 2021-08-05 MED ORDER — TRIAMCINOLONE ACETONIDE 40 MG/ML IJ SUSP
80.0000 mg | Freq: Once | INTRAMUSCULAR | Status: AC
  Administered 2021-08-05: 80 mg via INTRAMUSCULAR

## 2021-08-05 NOTE — Patient Instructions (Signed)
Start on zpack. Albuterol inhaler 2 puffs qid prn dyspnea Start on adipex 15 mg once in am.

## 2021-08-05 NOTE — Assessment & Plan Note (Signed)
Continue tizanidine.  

## 2021-08-05 NOTE — Assessment & Plan Note (Signed)
No wearing cpap.

## 2021-08-05 NOTE — Assessment & Plan Note (Signed)
Start breztri 2 puffs twice a day.  Start albuterol hfa 2 puffs qid prn.

## 2021-08-06 LAB — CBC WITH DIFFERENTIAL/PLATELET
Basophils Absolute: 0.1 10*3/uL (ref 0.0–0.2)
Basos: 1 %
EOS (ABSOLUTE): 0.2 10*3/uL (ref 0.0–0.4)
Eos: 3 %
Hematocrit: 41.4 % (ref 34.0–46.6)
Hemoglobin: 13.5 g/dL (ref 11.1–15.9)
Immature Grans (Abs): 0 10*3/uL (ref 0.0–0.1)
Immature Granulocytes: 0 %
Lymphocytes Absolute: 2 10*3/uL (ref 0.7–3.1)
Lymphs: 45 %
MCH: 28.1 pg (ref 26.6–33.0)
MCHC: 32.6 g/dL (ref 31.5–35.7)
MCV: 86 fL (ref 79–97)
Monocytes Absolute: 0.4 10*3/uL (ref 0.1–0.9)
Monocytes: 9 %
Neutrophils Absolute: 1.9 10*3/uL (ref 1.4–7.0)
Neutrophils: 42 %
Platelets: 325 10*3/uL (ref 150–450)
RBC: 4.81 x10E6/uL (ref 3.77–5.28)
RDW: 13.2 % (ref 11.7–15.4)
WBC: 4.5 10*3/uL (ref 3.4–10.8)

## 2021-08-06 LAB — COMPREHENSIVE METABOLIC PANEL
ALT: 16 IU/L (ref 0–32)
AST: 17 IU/L (ref 0–40)
Albumin/Globulin Ratio: 1.9 (ref 1.2–2.2)
Albumin: 4.7 g/dL (ref 3.8–4.9)
Alkaline Phosphatase: 64 IU/L (ref 44–121)
BUN/Creatinine Ratio: 29 — ABNORMAL HIGH (ref 9–23)
BUN: 22 mg/dL (ref 6–24)
Bilirubin Total: 0.3 mg/dL (ref 0.0–1.2)
CO2: 26 mmol/L (ref 20–29)
Calcium: 9.7 mg/dL (ref 8.7–10.2)
Chloride: 102 mmol/L (ref 96–106)
Creatinine, Ser: 0.75 mg/dL (ref 0.57–1.00)
Globulin, Total: 2.5 g/dL (ref 1.5–4.5)
Glucose: 107 mg/dL — ABNORMAL HIGH (ref 70–99)
Potassium: 4.1 mmol/L (ref 3.5–5.2)
Sodium: 144 mmol/L (ref 134–144)
Total Protein: 7.2 g/dL (ref 6.0–8.5)
eGFR: 94 mL/min/{1.73_m2} (ref >59)

## 2021-08-06 LAB — LIPID PANEL
Chol/HDL Ratio: 4.2 ratio (ref 0.0–4.4)
Cholesterol, Total: 288 mg/dL — ABNORMAL HIGH (ref 100–199)
HDL: 69 mg/dL (ref >39)
LDL Chol Calc (NIH): 199 mg/dL — ABNORMAL HIGH (ref 0–99)
Triglycerides: 117 mg/dL (ref 0–149)
VLDL Cholesterol Cal: 20 mg/dL (ref 5–40)

## 2021-08-06 LAB — HEMOGLOBIN A1C
Est. average glucose Bld gHb Est-mCnc: 120 mg/dL
Hgb A1c MFr Bld: 5.8 % — ABNORMAL HIGH (ref 4.8–5.6)

## 2021-08-06 LAB — HSV(HERPES SIMPLEX VRS) I + II AB-IGG
HSV 1 Glycoprotein G Ab, IgG: 21.3 index — ABNORMAL HIGH (ref 0.00–0.90)
HSV 2 IgG, Type Spec: <0.91 index (ref 0.00–0.90)

## 2021-08-06 LAB — HIV ANTIBODY (ROUTINE TESTING W REFLEX): HIV Screen 4th Generation wRfx: NONREACTIVE

## 2021-08-06 LAB — HCV AB W REFLEX TO QUANT PCR: HCV Ab: <0.1 s/co ratio (ref 0.0–0.9)

## 2021-08-06 NOTE — Progress Notes (Signed)
Blood count normal.  Liver function normal.  Kidney function normal.  Thyroid function normal.  Cholesterol: very high. LDL increased to 199. I do not see statins documented as having adverse reaction, but I think she may have told me this. If she has not tried statins, I would recommend crestor 20 mg once daily at night. If she has and failed some statins, I would recommend repatha (injectable.) HBA1C: 5.8. Herpes 1 positive (cold sores.) Herpes 2 negative (genital herpes) Hepatitis c negative.  HIV negative.  Gonorrhea and Chlamydia test did not come back. There was an error with the tube I used. Please ask her to come back and give [place] a urine to retest. She will not be charged twice.

## 2021-08-07 ENCOUNTER — Encounter: Payer: Self-pay | Admitting: Family Medicine

## 2021-08-07 ENCOUNTER — Ambulatory Visit (INDEPENDENT_AMBULATORY_CARE_PROVIDER_SITE_OTHER): Payer: Medicare Other

## 2021-08-07 DIAGNOSIS — R300 Dysuria: Secondary | ICD-10-CM | POA: Diagnosis not present

## 2021-08-07 DIAGNOSIS — G720 Drug-induced myopathy: Secondary | ICD-10-CM | POA: Insufficient documentation

## 2021-08-07 DIAGNOSIS — Z6828 Body mass index (BMI) 28.0-28.9, adult: Secondary | ICD-10-CM | POA: Insufficient documentation

## 2021-08-07 LAB — POCT URINALYSIS DIPSTICK
Bilirubin, UA: NEGATIVE
Blood, UA: NEGATIVE
Glucose, UA: NEGATIVE
Ketones, UA: NEGATIVE
Leukocytes, UA: NEGATIVE
Nitrite, UA: NEGATIVE
Protein, UA: NEGATIVE
Spec Grav, UA: 1.015 (ref 1.010–1.025)
Urobilinogen, UA: NEGATIVE E.U./dL — AB
pH, UA: 6 (ref 5.0–8.0)

## 2021-08-07 NOTE — Assessment & Plan Note (Signed)
z pack sent

## 2021-08-07 NOTE — Assessment & Plan Note (Signed)
Urine normal. If worsens, may return for repeat UA.

## 2021-08-07 NOTE — Assessment & Plan Note (Signed)
The current medical regimen is effective;  continue present plan and medications. Continue lisinopril 20 mg bid.

## 2021-08-07 NOTE — Assessment & Plan Note (Signed)
Intolerant to statins and repatha.  Recommend zetia.

## 2021-08-07 NOTE — Assessment & Plan Note (Signed)
Trial on phentermine 15 mg once daily. Follow up in 1 month

## 2021-08-09 ENCOUNTER — Other Ambulatory Visit: Payer: Self-pay | Admitting: Family Medicine

## 2021-08-09 MED ORDER — FLUCONAZOLE 150 MG PO TABS: 150.0000 mg | ORAL_TABLET | Freq: Every day | ORAL | 0 refills | Status: DC

## 2021-08-10 ENCOUNTER — Other Ambulatory Visit: Payer: Self-pay | Admitting: Family Medicine

## 2021-08-10 LAB — CT, NG, MYCOPLASMAS NAA, SWAB
Chlamydia trachomatis, NAA: NEGATIVE
Mycoplasma genitalium NAA: NEGATIVE
Mycoplasma hominis NAA: NEGATIVE
Neisseria gonorrhoeae, NAA: NEGATIVE
Ureaplasma spp NAA: NEGATIVE

## 2021-08-13 LAB — GC/CHLAMYDIA PROBE AMP
Chlamydia trachomatis, NAA: NEGATIVE
Neisseria Gonorrhoeae by PCR: NEGATIVE

## 2021-08-14 ENCOUNTER — Telehealth: Payer: Self-pay

## 2021-08-14 NOTE — Chronic Care Management (AMB) (Signed)
    Chronic Care Management Pharmacy Assistant   Name: Laura [name]  MRN: [id number] DOB: Feb 21, 1966  Reason for Encounter: Patient Assistance Coordination  08/14/2021- Patient assistance application filled out for Repatha with [employer] patient assistance program. Called patient to inform, no answer, left message that I will place application in the mail but also wanted to schedule a follow up visit with [place] Clinical Pharmacist to discuss medications and Repatha. No answer, left message to return call.   08/31/2021- Called patient again to follow up on [employer] patient assistance application for Repatha. No answer, left message to return call or leave a message if she has received applications and needs any help with application.  Medications: Outpatient Encounter Medications as of 08/14/2021  Medication Sig   albuterol (VENTOLIN HFA) 108 (90 Base) MCG/ACT inhaler Inhale 2 puffs into the lungs every 4 (four) hours as needed for wheezing or shortness of breath.   ALPRAZolam (XANAX) 1 MG tablet Take 1 mg by mouth 4 (four) times daily.    azithromycin (ZITHROMAX) 250 MG tablet 2 DAILY FOR FIRST DAY, THEN DECREASE TO ONE DAILY FOR 4 MORE DAYS.   Blood Glucose Monitoring Suppl (ACCU-CHEK GUIDE) w/Device KIT 1 each by Does not apply route daily.   EPINEPHrine 0.3 mg/0.3 mL IJ SOAJ injection Inject 0.3 mLs (0.3 mg total) into the muscle as needed for anaphylaxis.   fluconazole (DIFLUCAN) 150 MG tablet Take 1 tablet (150 mg total) by mouth daily at 6 (six) AM.   furosemide (LASIX) 40 MG tablet Take 40 mg by mouth 2 (two) times daily.   glucose blood (ACCU-CHEK GUIDE) test strip 1 each by Other route daily in the afternoon. Use as instructed   hydrochlorothiazide (HYDRODIURIL) 25 MG tablet Take 25 mg by mouth as needed.   hydrOXYzine (VISTARIL) 100 MG capsule Take 1 capsule (100 mg total) by mouth 3 (three) times daily as needed for itching.   levothyroxine  (SYNTHROID) 112 MCG tablet TAKE 1 TABLET BY MOUTH  DAILY   phentermine 15 MG capsule Take 1 capsule (15 mg total) by mouth every morning.   potassium chloride SA (KLOR-CON) 20 MEQ tablet TAKE 1 TABLET BY MOUTH  DAILY   quinapril (ACCUPRIL) 20 MG tablet TAKE 1 TABLET BY MOUTH  TWICE DAILY   tiZANidine (ZANAFLEX) 4 MG tablet Take 4 mg by mouth 3 (three) times daily as needed.    zolpidem (AMBIEN) 10 MG tablet Take 10 mg by mouth at bedtime.   Facility-Administered Encounter Medications as of 08/14/2021  Medication   albuterol (PROVENTIL) (2.5 MG/3ML) 0.083% nebulizer solution 2.5 mg    Pattricia Boss, [place] Pharmacist Assistant [phone]

## 2021-08-19 ENCOUNTER — Ambulatory Visit (INDEPENDENT_AMBULATORY_CARE_PROVIDER_SITE_OTHER): Payer: Medicare Other | Admitting: Nurse Practitioner

## 2021-08-19 ENCOUNTER — Encounter: Payer: Self-pay | Admitting: Nurse Practitioner

## 2021-08-19 VITALS — BP 132/62 | HR 77 | Temp 96.4°F | Ht 64.0 in | Wt 164.0 lb

## 2021-08-19 DIAGNOSIS — R051 Acute cough: Secondary | ICD-10-CM | POA: Diagnosis not present

## 2021-08-19 DIAGNOSIS — Z8619 Personal history of other infectious and parasitic diseases: Secondary | ICD-10-CM

## 2021-08-19 DIAGNOSIS — J0181 Other acute recurrent sinusitis: Secondary | ICD-10-CM | POA: Diagnosis not present

## 2021-08-19 DIAGNOSIS — J4541 Moderate persistent asthma with (acute) exacerbation: Secondary | ICD-10-CM | POA: Diagnosis not present

## 2021-08-19 LAB — POCT INFLUENZA A/B
Influenza A, POC: NEGATIVE
Influenza B, POC: NEGATIVE

## 2021-08-19 LAB — POC COVID19 BINAXNOW: SARS Coronavirus 2 Ag: NEGATIVE

## 2021-08-19 LAB — POCT RAPID STREP A (OFFICE): Rapid Strep A Screen: NEGATIVE

## 2021-08-19 MED ORDER — PROMETHAZINE-DM 6.25-15 MG/5ML PO SYRP: 5.0000 mL | ORAL_SOLUTION | Freq: Four times a day (QID) | ORAL | 0 refills | Status: DC | PRN

## 2021-08-19 MED ORDER — PREDNISONE 20 MG PO TABS: 20.0000 mg | ORAL_TABLET | Freq: Every day | ORAL | 0 refills | Status: DC

## 2021-08-19 MED ORDER — AMOXICILLIN-POT CLAVULANATE 875-125 MG PO TABS: 1.0000 | ORAL_TABLET | Freq: Two times a day (BID) | ORAL | 0 refills | Status: DC

## 2021-08-19 MED ORDER — IPRATROPIUM-ALBUTEROL 0.5-2.5 (3) MG/3ML IN SOLN: 3.0000 mL | RESPIRATORY_TRACT | 2 refills | Status: AC | PRN

## 2021-08-19 MED ORDER — BUDESONIDE-FORMOTEROL FUMARATE 80-4.5 MCG/ACT IN AERO: 2.0000 | INHALATION_SPRAY | Freq: Two times a day (BID) | RESPIRATORY_TRACT | 3 refills | Status: DC

## 2021-08-19 MED ORDER — FLUCONAZOLE 150 MG PO TABS: 150.0000 mg | ORAL_TABLET | Freq: Every day | ORAL | 0 refills | Status: DC

## 2021-08-19 NOTE — Progress Notes (Signed)
Acute Office Visit  Subjective:    Patient ID: Laura [name], female    DOB: 1966/07/19, 55 y.o.   MRN: [id number]  Chief Complaint  Patient presents with   COUGH    HPI: [name] is a 55 year old Caucasian female that presents with cough, sinus congestion, chest tightness, and wheezing. Onset was a few weeks ago. Treatment has included Z-pack, Breztri and Albuterol inhalers prescribed by PCP during routine office visit on 08/05/21. States symptoms improved initially but have worsened the past 3 days. She is a cigarette smoker and has past history of asthma.    Past Medical History:  Diagnosis Date   Acute GI bleeding 09/01/2019   Anxiety and depression    Bowel obstruction ([place])    Chest pain    a. 01/2016 Ex MV: Hypertensive response. Freq PVCs w/ exercise. nl EF. No ST/T changes. No ischemia.   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 03/2016 Echo: EF 60-65%, no rwma, mild MR, nl LA size, nl RV fxn.   High cholesterol    Kidney stones    Nasal septal perforation 05/12/2018   Hx of cocaine use   Palpitations    a. 03/2016 Holter: Sinus rhythm, avg HR 83, max 123, min 64. 4 PACs. 10,356 isolated PVCs, one vent couplet, 3842 V bigeminy, 4 beats NSVT->prev on BB - dc 2/2 swelling.   Prediabetes 12/23/2015   Overview:  Hba1c higher but not diabetic. Took metformin to try to lessen   Raynaud disease    Rectocele    Sleep apnea    "mild" per pt   Torn rotator cuff    left   Urinary retention with incomplete bladder emptying    Vaginal dryness, menopausal    Vaginal enterocele    Vitamin D deficiency 12/03/2014   Wears hearing aid in both ears     Past Surgical History:  Procedure Laterality Date   19 HOUR [place] STUDY N/A 10/11/2018   Procedure: 24 HOUR PH STUDY;  Surgeon: Mauri Pole, MD;  Location: [facility];  Service: Endoscopy;  Laterality: N/A;   ABDOMINAL HYSTERECTOMY      ANKLE SURGERY     ran over by mother in car by [place]  09/02/2019   Procedure: BIOPSY;  Surgeon: Rush Landmark Telford Nab., MD;  Location: [place];  Service: Gastroenterology;;   COLONOSCOPY     COLONOSCOPY WITH PROPOFOL N/A 09/02/2019   Procedure: COLONOSCOPY WITH PROPOFOL;  Surgeon: Irving Copas., MD;  Location: [place];  Service: Gastroenterology;  Laterality: N/A;   COLPORRHAPHY  2015   posterior and enterocele ligation   CYSTOSCOPY  04/11/2017   Procedure: CYSTOSCOPY;  Surgeon: Defrancesco, Alanda Slim, MD;  Location: [facility];  Service: Gynecology;;   ESOPHAGEAL MANOMETRY N/A 10/11/2018   Procedure: ESOPHAGEAL MANOMETRY (EM);  Surgeon: Mauri Pole, MD;  Location: [facility];  Service: Endoscopy;  Laterality: N/A;   ESOPHAGOGASTRODUODENOSCOPY (EGD) WITH PROPOFOL N/A 09/02/2019   Procedure: ESOPHAGOGASTRODUODENOSCOPY (EGD) WITH PROPOFOL;  Surgeon: Rush Landmark Telford Nab., MD;  Location: [place];  Service: Gastroenterology;  Laterality: N/A;   EXTRACORPOREAL SHOCK WAVE LITHOTRIPSY Left 07/31/2020   Procedure: EXTRACORPOREAL SHOCK WAVE LITHOTRIPSY (ESWL);  Surgeon: Billey Co, MD;  Location: [facility];  Service: Urology;  Laterality: Left;   KNEE ARTHROSCOPY WITH MEDIAL MENISECTOMY Right 02/04/2020  Procedure: KNEE ARTHROSCOPY WITH PARTIAL LATERAL AND MEDIAL MENISECTOMY,  PARTIAL SYNOVECTOMY AND CHONDROPLASTY;  Surgeon: Lovell Sheehan, MD;  Location: [place];  Service: Orthopedics;  Laterality: Right;   LAPAROSCOPIC SALPINGO OOPHERECTOMY Left 04/11/2017   Procedure: LAPAROSCOPIC LEFT SALPINGO OOPHORECTOMY;  Surgeon: Brayton Mars, MD;  Location: [facility];  Service: Gynecology;  Laterality: Left;   LITHOTRIPSY     OOPHORECTOMY     PARTIAL HYSTERECTOMY     [place] IMPEDANCE STUDY N/A 10/11/2018   Procedure: [place] IMPEDANCE STUDY;  Surgeon: Mauri Pole, MD;  Location: [facility];  Service: Endoscopy;  Laterality:  N/A;   PVC ABLATION N/A 01/18/2020   Procedure: PVC ABLATION;  Surgeon: Thompson Grayer, MD;  Location: [facility];  Service: Cardiovascular;  Laterality: N/A;   thumb surgery     UPPER GASTROINTESTINAL ENDOSCOPY      Family History  Problem Relation Age of Onset   Stroke Father    Diabetes Father    Breast cancer Mother 45   Diverticulitis Mother    Esophageal cancer Maternal Grandfather    Colon cancer Paternal Grandmother    Suicidality Brother    Ovarian cancer Neg Hx    Stomach cancer Neg Hx     Social History   Socioeconomic History   Marital status: Legally Separated    Spouse name: Not on file   Number of children: 4   Years of education: Not on file   Highest education level: Some college, no degree  Occupational History   Occupation: Disabled  Tobacco Use   Smoking status: Former    Types: Cigarettes    Quit date: 04/08/1995    Years since quitting: 26.3   Smokeless tobacco: Never  Vaping Use   Vaping Use: Never used  Substance and Sexual Activity   Alcohol use: Not Currently    Alcohol/week: 1.0 standard drink    Types: 1 Glasses of wine per week    Comment: rare; once every 6 months   Drug use: No   Sexual activity: Yes    Partners: Male    Birth control/protection: Surgical  Other Topics Concern   Not on file  Social History Narrative   Separated - 1 son and 1 daughter   Disabled    1 caffeine/day   Past smoker   No EtOH, drugs      08/02/2018      Social Determinants of Health   Financial Resource Strain: Low Risk    Difficulty of Paying Living Expenses: Not hard at all  Food Insecurity: No Food Insecurity   Worried About Charity fundraiser in the Last Year: Never true   Ran Out of Food in the Last Year: Never true  Transportation Needs: No Transportation Needs   Lack of Transportation (Medical): No   Lack of Transportation (Non-Medical): No  Physical Activity: Sufficiently Active   Days of Exercise per Week: 6 days   Minutes of  Exercise per Session: 60 min  Stress: No Stress Concern Present   Feeling of Stress : Not at all  Social Connections: Not on file  Intimate Partner Violence: Not At Risk   Fear of Current or Ex-Partner: No   Emotionally Abused: No   Physically Abused: No   Sexually Abused: No    Outpatient Medications Prior to Visit  Medication Sig Dispense Refill   albuterol (VENTOLIN HFA) 108 (90 Base) MCG/ACT inhaler Inhale 2 puffs into the lungs every 4 (four) hours as needed for  wheezing or shortness of breath. 18 g 3   ALPRAZolam (XANAX) 1 MG tablet Take 1 mg by mouth 4 (four) times daily.      azithromycin (ZITHROMAX) 250 MG tablet 2 DAILY FOR FIRST DAY, THEN DECREASE TO ONE DAILY FOR 4 MORE DAYS. 6 tablet 0   Blood Glucose Monitoring Suppl (ACCU-CHEK GUIDE) w/Device KIT 1 each by Does not apply route daily. 1 kit 0   EPINEPHrine 0.3 mg/0.3 mL IJ SOAJ injection Inject 0.3 mLs (0.3 mg total) into the muscle as needed for anaphylaxis. 1 each 2   fluconazole (DIFLUCAN) 150 MG tablet Take 1 tablet (150 mg total) by mouth daily at 6 (six) AM. 3 tablet 0   furosemide (LASIX) 40 MG tablet Take 40 mg by mouth 2 (two) times daily.     glucose blood (ACCU-CHEK GUIDE) test strip 1 each by Other route daily in the afternoon. Use as instructed 100 each 12   hydrochlorothiazide (HYDRODIURIL) 25 MG tablet Take 25 mg by mouth as needed.     hydrOXYzine (VISTARIL) 100 MG capsule Take 1 capsule (100 mg total) by mouth 3 (three) times daily as needed for itching. 30 capsule 0   levothyroxine (SYNTHROID) 112 MCG tablet TAKE 1 TABLET BY MOUTH  DAILY 90 tablet 3   phentermine 15 MG capsule Take 1 capsule (15 mg total) by mouth every morning. 30 capsule 0   potassium chloride SA (KLOR-CON) 20 MEQ tablet TAKE 1 TABLET BY MOUTH  DAILY 90 tablet 2   quinapril (ACCUPRIL) 20 MG tablet TAKE 1 TABLET BY MOUTH  TWICE DAILY 180 tablet 2   tiZANidine (ZANAFLEX) 4 MG tablet Take 4 mg by mouth 3 (three) times daily as needed.   0    zolpidem (AMBIEN) 10 MG tablet Take 10 mg by mouth at bedtime.     Facility-Administered Medications Prior to Visit  Medication Dose Route Frequency Provider Last Rate Last Admin   albuterol (PROVENTIL) (2.5 MG/3ML) 0.083% nebulizer solution 2.5 mg  2.5 mg Nebulization Q4H PRN Cox, Kirsten, MD        Allergies  Allergen Reactions   Meperidine Hives   Shellfish Allergy Shortness Of Breath and Swelling   Diltiazem Swelling   Acebutolol Swelling and Other (See Comments)   Amlodipine     Swelling    Celebrex [Celecoxib] Swelling    Patient began taking for knee pain and started swelling (hands, feet, face).    Codeine Hives and Nausea And Vomiting   Dexlansoprazole Other (See Comments)    Abdominal pain   Dronedarone Swelling and Other (See Comments)   Duloxetine Hcl Other (See Comments)    Made pt feel crazy   Flecainide Swelling   Gabapentin Other (See Comments)    Makes her feel crazy    Hydrocodone Other (See Comments)    Keeps patient awake.   Losartan     Swelling    Metoprolol Swelling   Mexiletine     Swelling - hands, legs, face   Mirtazapine Swelling   Omeprazole Other (See Comments)    Abdominal pain   Oxycodone Itching   Repatha [Evolocumab]     Malaise    Sectral [Acebutolol Hcl] Swelling   Statins Other (See Comments)    Muscle pain   Toradol [Ketorolac Tromethamine] Itching   Tramadol     Unable to sleep, makes her itch    Review of Systems  Constitutional:  Negative for chills, fatigue and fever.  HENT:  Positive for ear pain (  Right), rhinorrhea, sinus pressure, sinus pain and sore throat. Negative for congestion and postnasal drip.   Eyes: Negative.   Respiratory:  Positive for cough, chest tightness, shortness of breath and wheezing.   Cardiovascular:  Negative for chest pain (hest tightness).  Gastrointestinal:  Negative for diarrhea and nausea.  Endocrine: Negative.   Genitourinary: Negative.   Musculoskeletal:  Positive for arthralgias  (chronic) and myalgias (chronic).  Skin: Negative.   Allergic/Immunologic: Positive for environmental allergies.  Neurological:  Positive for headaches. Negative for dizziness.  Hematological: Negative.   Psychiatric/Behavioral: Negative.        Objective:    Physical Exam Vitals reviewed.  HENT:     Right Ear: Tympanic membrane normal.     Left Ear: Tympanic membrane normal.     Nose: Congestion and rhinorrhea present.     Mouth/Throat:     Pharynx: Posterior oropharyngeal erythema present.  Cardiovascular:     Rate and Rhythm: Normal rate and regular rhythm.     Pulses: Normal pulses.     Heart sounds: Normal heart sounds.  Pulmonary:     Effort: Pulmonary effort is normal.     Breath sounds: Wheezing and rhonchi present.  Abdominal:     General: Bowel sounds are normal.     Palpations: Abdomen is soft.  Musculoskeletal:     Cervical back: Neck supple.  Lymphadenopathy:     Cervical: No cervical adenopathy.  Skin:    General: Skin is warm and dry.     Capillary Refill: Capillary refill takes less than 2 seconds.  Neurological:     General: No focal deficit present.  Psychiatric:        Mood and Affect: Mood normal.        Behavior: Behavior normal.    BP 132/62   Pulse 77   Temp (!) 96.4 F (35.8 C)   Ht 5' 4"  (1.626 m)   Wt 164 lb (74.4 kg)   SpO2 99%   BMI 28.15 kg/m   Wt Readings from Last 3 Encounters:  08/05/21 168 lb (76.2 kg)  05/01/21 163 lb (73.9 kg)  02/03/21 163 lb (73.9 kg)    Health Maintenance Due  Topic Date Due   Zoster Vaccines- Shingrix (1 of 2) Never done   Pneumococcal Vaccine 28-1 Years old (2 - PCV) 12/17/2016   MAMMOGRAM  08/07/2020   COVID-19 Vaccine (4 - Booster for Pfizer series) 05/14/2021   INFLUENZA VACCINE  06/01/2021       Lab Results  Component Value Date   TSH 4.020 05/01/2021   Lab Results  Component Value Date   WBC 4.5 08/05/2021   HGB 13.5 08/05/2021   HCT 41.4 08/05/2021   MCV 86 08/05/2021   PLT  325 08/05/2021   Lab Results  Component Value Date   NA 144 08/05/2021   K 4.1 08/05/2021   CO2 26 08/05/2021   GLUCOSE 107 (H) 08/05/2021   BUN 22 08/05/2021   CREATININE 0.75 08/05/2021   BILITOT 0.3 08/05/2021   ALKPHOS 64 08/05/2021   AST 17 08/05/2021   ALT 16 08/05/2021   PROT 7.2 08/05/2021   ALBUMIN 4.7 08/05/2021   CALCIUM 9.7 08/05/2021   ANIONGAP 14 11/07/2019   EGFR 94 08/05/2021   Lab Results  Component Value Date   CHOL 288 (H) 08/05/2021   Lab Results  Component Value Date   HDL 69 08/05/2021   Lab Results  Component Value Date   LDLCALC 199 (H) 08/05/2021  Lab Results  Component Value Date   TRIG 117 08/05/2021   Lab Results  Component Value Date   CHOLHDL 4.2 08/05/2021   Lab Results  Component Value Date   HGBA1C 5.8 (H) 08/05/2021       Assessment & Plan:   1. Other acute recurrent sinusitis - amoxicillin-clavulanate (AUGMENTIN) 875-125 MG tablet; Take 1 tablet by mouth 2 (two) times daily.  Dispense: 20 tablet; Refill: 0 - predniSONE (DELTASONE) 20 MG tablet; Take 1 tablet (20 mg total) by mouth daily with breakfast. 1 po tid for 3 days then 1 po bid for 3 days then 1 po qd for 3 days  Dispense: 18 tablet; Refill: 0  2. Acute cough - POCT Influenza A/B-NEGATIVE - POC COVID-19 BinaxNow-NEGATIVE - POCT rapid strep A-NEGATIVE - promethazine-dextromethorphan (PROMETHAZINE-DM) 6.25-15 MG/5ML syrup; Take 5 mLs by mouth 4 (four) times daily as needed.  Dispense: 118 mL; Refill: 0  3. Moderate persistent asthma with exacerbation - budesonide-formoterol (SYMBICORT) 80-4.5 MCG/ACT inhaler; Inhale 2 puffs into the lungs 2 (two) times daily.  Dispense: 1 each; Refill: 3 - ipratropium-albuterol (DUONEB) 0.5-2.5 (3) MG/3ML SOLN; Take 3 mLs by nebulization every 4 (four) hours as needed.  Dispense: 360 mL; Refill: 2  4. History of candidiasis of vagina - fluconazole (DIFLUCAN) 150 MG tablet; Take 1 tablet (150 mg total) by mouth daily.  Dispense: 3  tablet; Refill: 0    Take Augmentin twice daily for 10 days Take Diflucan after antibiotics for yeast infection May eat yogurt and take probiotics to prevent yeast infection Use cough syrup up to 4 times daily Take Prednisone as prescribed Use Symbicort twice daily Use Duoneb nebulizer as needed Use albuterol rescue inhaler as needed   Follow-up: PRN  An After Visit Summary was printed and given to the patient.  I, Rip Harbour, NP, have reviewed all documentation for this visit. The documentation on 08/19/21 for the exam, diagnosis, procedures, and orders are all accurate and complete.   Rip Harbour, NP [place] [phone]

## 2021-08-19 NOTE — Patient Instructions (Addendum)
Take Augmentin twice daily for 10 days Take Diflucan after antibiotics for yeast infection May eat yogurt and take probiotics to prevent yeast infection Use cough syrup up to 4 times daily Take Prednisone as prescribed Use Symbicort twice daily Use Duoneb nebulizer as needed Use albuterol rescue inhaler as needed Seek emergency medical care if symptoms suddenly worse or become concerning Follow-up as needed    Sinusitis, Adult Sinusitis is soreness and swelling (inflammation) of your sinuses. Sinuses are hollow spaces in the bones around your face. They are located: Around your eyes. In the middle of your forehead. Behind your nose. In your cheekbones. Your sinuses and nasal passages are lined with a fluid called mucus. Mucus drains out of your sinuses. Swelling can trap mucus in your sinuses. This lets germs (bacteria, virus, or fungus) grow, which leads to infection. Most of the time, this condition is caused by a virus. What are the causes? This condition is caused by: Allergies. Asthma. Germs. Things that block your nose or sinuses. Growths in the nose (nasal polyps). Chemicals or irritants in the air. Fungus (rare). What increases the risk? You are more likely to develop this condition if: You have a weak body defense system (immune system). You do a lot of swimming or diving. You use nasal sprays too much. You smoke. What are the signs or symptoms? The main symptoms of this condition are pain and a feeling of pressure around the sinuses. Other symptoms include: Stuffy nose (congestion). Runny nose (drainage). Swelling and warmth in the sinuses. Headache. Toothache. A cough that may get worse at night. Mucus that collects in the throat or the back of the nose (postnasal drip). Being unable to smell and taste. Being very tired (fatigue). A fever. Sore throat. Bad breath. How is this diagnosed? This condition is diagnosed based on: Your symptoms. Your medical  history. A physical exam. Tests to find out if your condition is short-term (acute) or long-term (chronic). Your doctor may: Check your nose for growths (polyps). Check your sinuses using a tool that has a light (endoscope). Check for allergies or germs. Do imaging tests, such as an MRI or CT scan. How is this treated? Treatment for this condition depends on the cause and whether it is short-term or long-term. If caused by a virus, your symptoms should go away on their own within 10 days. You may be given medicines to relieve symptoms. They include: Medicines that shrink swollen tissue in the nose. Medicines that treat allergies (antihistamines). A spray that treats swelling of the nostrils.  Rinses that help get rid of thick mucus in your nose (nasal saline washes). If caused by bacteria, your doctor may wait to see if you will get better without treatment. You may be given antibiotic medicine if you have: A very bad infection. A weak body defense system. If caused by growths in the nose, you may need to have surgery. Follow these instructions at home: Medicines Take, use, or apply over-the-counter and prescription medicines only as told by your doctor. These may include nasal sprays. If you were prescribed an antibiotic medicine, take it as told by your doctor. Do not stop taking the antibiotic even if you start to feel better. Hydrate and humidify  Drink enough water to keep your pee (urine) pale yellow. Use a cool mist humidifier to keep the humidity level in your home above 50%. Breathe in steam for 10-15 minutes, 3-4 times a day, or as told by your doctor. You can do this  in the bathroom while a hot shower is running. Try not to spend time in cool or dry air. Rest Rest as much as you can. Sleep with your head raised (elevated). Make sure you get enough sleep each night. General instructions  Put a warm, moist washcloth on your face 3-4 times a day, or as often as told by your  doctor. This will help with discomfort. Wash your hands often with soap and water. If there is no soap and water, use hand sanitizer. Do not smoke. Avoid being around people who are smoking (secondhand smoke). Keep all follow-up visits as told by your doctor. This is important. Contact a doctor if: You have a fever. Your symptoms get worse. Your symptoms do not get better within 10 days. Get help right away if: You have a very bad headache. You cannot stop throwing up (vomiting). You have very bad pain or swelling around your face or eyes. You have trouble seeing. You feel confused. Your neck is stiff. You have trouble breathing. Summary Sinusitis is swelling of your sinuses. Sinuses are hollow spaces in the bones around your face. This condition is caused by tissues in your nose that become inflamed or swollen. This traps germs. These can lead to infection. If you were prescribed an antibiotic medicine, take it as told by your doctor. Do not stop taking it even if you start to feel better. Keep all follow-up visits as told by your doctor. This is important. This information is not intended to replace advice given to you by your health care provider. Make sure you discuss any questions you have with your health care provider. Document Revised: 03/20/2018 Document Reviewed: 03/20/2018 Elsevier Patient Education  2022 [place] Prevention, Adult Although you may not be able to control the fact that you have asthma, you can take actions to prevent episodes of asthma (asthma attacks). How can this condition affect me? Asthma attacks (flare ups) can cause trouble breathing, wheezing, and coughing. They may keep you from doing activities you like to do. What can increase my risk? Coming into contact with things that cause asthma symptoms (asthma triggers) can put you at risk for an asthma attack. Common asthma triggers include: Things you are allergic to (allergens), such  as: Dust mite and cockroach droppings. Pet dander. Mold. Pollen from trees and grasses. Food allergies. This might be a specific food or added chemicals called sulfites. Irritants, such as: Weather changes including very cold, dry, or humid air. Smoke. This includes campfire smoke, air pollution, and tobacco smoke. Strong odors from aerosol sprays and fumes from perfume, candles, and household cleaners. Other triggers, such as: Certain medicines. This includes NSAIDs, such as ibuprofen and aspirin. Viral respiratory infections (colds), including runny nose (rhinitis) or infection in the sinuses (sinusitis). Activity including exercise, laughing, or crying. Not using inhaled medicines (corticosteroids) as told. What actions can I take to prevent an asthma attack? Stay healthy. Stay up to date on all immunizations as told by your health care provider, including the yearly flu (influenza) vaccine and pneumonia vaccine. Many asthma attacks can be prevented by carefully following your written asthma action plan. Follow your asthma action plan Work with your health care provider to create a written asthma action plan. This plan should include: A list of your asthma triggers and how to avoid or reduce them. A list of symptoms that you may have during an asthma attack. Information about which medicine to take, when to take the medicine, and  how much of the medicine to take. Information to help you understand your peak flow measurements. Daily actions that you can take to prevent (control) your asthma symptoms. Contact information for your health care providers. If you have an asthma attack, act quickly. Follow the emergency steps on your written asthma action plan. This may prevent you from needing to go to the hospital. Monitor your asthma. To do this: Use your peak flow meter every morning and every evening for 2-3 weeks or as told by your health care provider. Record the results in a  journal. A drop in your peak flow numbers on one or more days may mean that you are starting to have an asthma attack, even if you are not having symptoms. When you have asthma symptoms, write them down in a journal. Write down in your journal how often you need to use your fast-acting rescue inhaler. If you are using your rescue inhaler more often, it may mean that your asthma is not under control. Talk with your health care provider about adjusting your asthma treatment plan to help you prevent future asthma attacks and gain better control of your condition.  Lifestyle Avoid or reduce contact with known outdoor allergens by staying indoors, keeping windows closed, and using air conditioning when pollen and mold counts are high. Do not use any products that contain nicotine or tobacco, such as cigarettes, e-cigarettes, and chewing tobacco. If you need help quitting, ask your health care provider. If you are overweight, consider a weight-loss plan. Find ways to cope with stress and your feelings, such as mindfulness, relaxation, or breathing exercises. Ask your health care provider if a breathing exercise program (pulmonary rehabilitation) may be helpful to control symptoms and improve your quality of life. Medicines  Take over-the-counter and prescription medicines only as told by your health care provider. Do not stop taking your medicine and do not take less medicine even if you are doing well. Let your health care provider know: How often you use your rescue inhaler. How often you have symptoms when you are taking your regular medicines. If you wake up at night because of asthma symptoms. If you have more trouble with your breathing when you exercise. Activity Do your normal activities as told by your health care provider. Ask your health care provider what activities are safe for you. Some people have asthma symptoms or more asthma symptoms when they exercise. This is called  exercise-induced bronchoconstriction (EIB). If you have this problem, talk with your health care provider about how to manage EIB. Some tips to follow include: Use your fast-acting inhaler before exercise. Exercise indoors if it is very cold, humid, or the pollen and mold counts are high. Warm up and cool down before and after exercise. Stop exercising right away if your asthma symptoms start or get worse. Where to find more information Asthma and Allergy Foundation of America: www.aafa.org Centers for Disease Control and Prevention: http://www.wolf.info/ American Lung Association: www.lung.org National Heart, Lung, and Blood Institute: https://wilson-eaton.com/ World Health Organization: RoleLink.com.br Get help right away if: You have followed your written asthma action plan and your symptoms are not improving. Summary Asthma attacks (flare ups) can cause trouble breathing, wheezing, and coughing. They may keep you from doing activities you normally like to do. Work with your health care provider to create a written asthma action plan. Do not stop taking your medicine and do not take less medicine even if you are doing well. Do not use any products that  contain nicotine or tobacco, such as cigarettes, e-cigarettes, and chewing tobacco. If you need help quitting, ask your health care provider. This information is not intended to replace advice given to you by your health care provider. Make sure you discuss any questions you have with your health care provider. Document Revised: 10/16/2019 Document Reviewed: 10/16/2019 Elsevier Patient Education  2022 [employer].

## 2021-09-02 ENCOUNTER — Ambulatory Visit (INDEPENDENT_AMBULATORY_CARE_PROVIDER_SITE_OTHER): Payer: Medicare Other | Admitting: Family Medicine

## 2021-09-02 ENCOUNTER — Other Ambulatory Visit: Payer: Self-pay

## 2021-09-02 ENCOUNTER — Ambulatory Visit (INDEPENDENT_AMBULATORY_CARE_PROVIDER_SITE_OTHER): Payer: Medicare Other

## 2021-09-02 ENCOUNTER — Encounter: Payer: Self-pay | Admitting: Family Medicine

## 2021-09-02 VITALS — BP 110/78 | HR 72 | Temp 97.3°F | Resp 18 | Ht 64.0 in | Wt 169.0 lb

## 2021-09-02 DIAGNOSIS — I10 Essential (primary) hypertension: Secondary | ICD-10-CM | POA: Diagnosis not present

## 2021-09-02 DIAGNOSIS — Z6828 Body mass index (BMI) 28.0-28.9, adult: Secondary | ICD-10-CM

## 2021-09-02 DIAGNOSIS — Z23 Encounter for immunization: Secondary | ICD-10-CM

## 2021-09-02 DIAGNOSIS — R0789 Other chest pain: Secondary | ICD-10-CM | POA: Diagnosis not present

## 2021-09-02 DIAGNOSIS — J4540 Moderate persistent asthma, uncomplicated: Secondary | ICD-10-CM

## 2021-09-02 DIAGNOSIS — M797 Fibromyalgia: Secondary | ICD-10-CM

## 2021-09-02 DIAGNOSIS — E663 Overweight: Secondary | ICD-10-CM

## 2021-09-02 MED ORDER — PREGABALIN 25 MG PO CAPS: 25.0000 mg | ORAL_CAPSULE | Freq: Three times a day (TID) | ORAL | 0 refills | Status: DC

## 2021-09-02 MED ORDER — PHENTERMINE HCL 15 MG PO CAPS: 15.0000 mg | ORAL_CAPSULE | ORAL | 0 refills | Status: DC

## 2021-09-02 NOTE — Progress Notes (Signed)
Subjective:  Patient ID: Laura [name], female    DOB: [birth year]  Age: 55 y.o. MRN: [id number]  Chief Complaint  Patient presents with   Hypertension    HPI  Asthma: started breztri 2 puffs twice daily. Started albuterol for rescue inhaler. Somehow pt is back on low dose symbicort. She says it is working.  Hyperlipidemia: pt is intolerant to statins and repatha is too expensive and caused some malaise.  Overweight: started on phentermine 15 mg once daily last week. She lost 4 lbs then came in last week for a sick visit and was given prednisone and gained 5 lbs. She did not tolerate the high dose of prednisone 60 mg daily, but when she took 20 mg daily instead she said she felt great. She has completed this and her FM went right back to where it was.   Current Outpatient Medications on File Prior to Visit  Medication Sig Dispense Refill   albuterol (VENTOLIN HFA) 108 (90 Base) MCG/ACT inhaler Inhale 2 puffs into the lungs every 4 (four) hours as needed for wheezing or shortness of breath. 18 g 3   ALPRAZolam (XANAX) 1 MG tablet Take 1 mg by mouth 4 (four) times daily.      Blood Glucose Monitoring Suppl (ACCU-CHEK GUIDE) w/Device KIT 1 each by Does not apply route daily. 1 kit 0   budesonide-formoterol (SYMBICORT) 80-4.5 MCG/ACT inhaler Inhale 2 puffs into the lungs 2 (two) times daily. 1 each 3   EPINEPHrine 0.3 mg/0.3 mL IJ SOAJ injection Inject 0.3 mLs (0.3 mg total) into the muscle as needed for anaphylaxis. 1 each 2   furosemide (LASIX) 40 MG tablet Take 40 mg by mouth 2 (two) times daily.     glucose blood (ACCU-CHEK GUIDE) test strip 1 each by Other route daily in the afternoon. Use as instructed 100 each 12   hydrochlorothiazide (HYDRODIURIL) 25 MG tablet Take 25 mg by mouth as needed.     hydrOXYzine (VISTARIL) 100 MG capsule Take 1 capsule (100 mg total) by mouth 3 (three) times daily as needed for itching. 30 capsule 0   ipratropium-albuterol (DUONEB) 0.5-2.5 (3) MG/3ML SOLN  Take 3 mLs by nebulization every 4 (four) hours as needed. 360 mL 2   levothyroxine (SYNTHROID) 112 MCG tablet TAKE 1 TABLET BY MOUTH  DAILY 90 tablet 3   potassium chloride SA (KLOR-CON) 20 MEQ tablet TAKE 1 TABLET BY MOUTH  DAILY 90 tablet 2   predniSONE (DELTASONE) 20 MG tablet Take 1 tablet (20 mg total) by mouth daily with breakfast. 1 po tid for 3 days then 1 po bid for 3 days then 1 po qd for 3 days 18 tablet 0   promethazine-dextromethorphan (PROMETHAZINE-DM) 6.25-15 MG/5ML syrup Take 5 mLs by mouth 4 (four) times daily as needed. 118 mL 0   quinapril (ACCUPRIL) 20 MG tablet TAKE 1 TABLET BY MOUTH  TWICE DAILY 180 tablet 2   tiZANidine (ZANAFLEX) 4 MG tablet Take 4 mg by mouth 3 (three) times daily as needed.   0   zolpidem (AMBIEN) 10 MG tablet Take 10 mg by mouth at bedtime.     Current Facility-Administered Medications on File Prior to Visit  Medication Dose Route Frequency Provider Last Rate Last Admin   albuterol (PROVENTIL) (2.5 MG/3ML) 0.083% nebulizer solution 2.5 mg  2.5 mg Nebulization Q4H PRN Rochel Brome, MD       Past Medical History:  Diagnosis Date   Acute GI bleeding 09/01/2019   Anxiety and depression  Bowel obstruction ([place])    Chest pain    a. 01/2016 Ex MV: Hypertensive response. Freq PVCs w/ exercise. nl EF. No ST/T changes. No ischemia.   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 03/2016 Echo: EF 60-65%, no rwma, mild MR, nl LA size, nl RV fxn.   High cholesterol    Kidney stones    Nasal septal perforation 05/12/2018   Hx of cocaine use   Palpitations    a. 03/2016 Holter: Sinus rhythm, avg HR 83, max 123, min 64. 4 PACs. 10,356 isolated PVCs, one vent couplet, 3842 V bigeminy, 4 beats NSVT->prev on BB - dc 2/2 swelling.   Prediabetes 12/23/2015   Overview:  Hba1c higher but not diabetic. Took metformin to try to lessen   Raynaud disease    Rectocele    Sleep  apnea    "mild" per pt   Torn rotator cuff    left   Urinary retention with incomplete bladder emptying    Vaginal dryness, menopausal    Vaginal enterocele    Vitamin D deficiency 12/03/2014   Wears hearing aid in both ears    Past Surgical History:  Procedure Laterality Date   55 HOUR [place] STUDY N/A 10/11/2018   Procedure: 24 HOUR PH STUDY;  Surgeon: Mauri Pole, MD;  Location: [facility];  Service: Endoscopy;  Laterality: N/A;   ABDOMINAL HYSTERECTOMY     ANKLE SURGERY     ran over by mother in car by [place]  09/02/2019   Procedure: BIOPSY;  Surgeon: Rush Landmark Telford Nab., MD;  Location: [facility];  Service: Gastroenterology;;   COLONOSCOPY     COLONOSCOPY WITH PROPOFOL N/A 09/02/2019   Procedure: COLONOSCOPY WITH PROPOFOL;  Surgeon: Irving Copas., MD;  Location: [facility];  Service: Gastroenterology;  Laterality: N/A;   COLPORRHAPHY  2015   posterior and enterocele ligation   CYSTOSCOPY  04/11/2017   Procedure: CYSTOSCOPY;  Surgeon: Defrancesco, Alanda Slim, MD;  Location: [facility];  Service: Gynecology;;   ESOPHAGEAL MANOMETRY N/A 10/11/2018   Procedure: ESOPHAGEAL MANOMETRY (EM);  Surgeon: Mauri Pole, MD;  Location: [facility];  Service: Endoscopy;  Laterality: N/A;   ESOPHAGOGASTRODUODENOSCOPY (EGD) WITH PROPOFOL N/A 09/02/2019   Procedure: ESOPHAGOGASTRODUODENOSCOPY (EGD) WITH PROPOFOL;  Surgeon: Rush Landmark Telford Nab., MD;  Location: [place];  Service: Gastroenterology;  Laterality: N/A;   EXTRACORPOREAL SHOCK WAVE LITHOTRIPSY Left 07/31/2020   Procedure: EXTRACORPOREAL SHOCK WAVE LITHOTRIPSY (ESWL);  Surgeon: Billey Co, MD;  Location: [facility];  Service: Urology;  Laterality: Left;   KNEE ARTHROSCOPY WITH MEDIAL MENISECTOMY Right 02/04/2020   Procedure: KNEE ARTHROSCOPY WITH PARTIAL LATERAL AND MEDIAL MENISECTOMY,  PARTIAL SYNOVECTOMY AND CHONDROPLASTY;  Surgeon: Lovell Sheehan, MD;  Location: [facility];  Service: Orthopedics;  Laterality: Right;   LAPAROSCOPIC SALPINGO OOPHERECTOMY Left 04/11/2017   Procedure: LAPAROSCOPIC LEFT SALPINGO OOPHORECTOMY;  Surgeon: Brayton Mars, MD;  Location: [facility];  Service: Gynecology;  Laterality: Left;   LITHOTRIPSY     OOPHORECTOMY     PARTIAL HYSTERECTOMY     [place] IMPEDANCE STUDY N/A 10/11/2018   Procedure: [place] IMPEDANCE STUDY;  Surgeon: Mauri Pole, MD;  Location: [facility];  Service: Endoscopy;  Laterality: N/A;   PVC ABLATION N/A 01/18/2020   Procedure: PVC ABLATION;  Surgeon: Thompson Grayer, MD;  Location: [facility];  Service: Cardiovascular;  Laterality: N/A;   thumb surgery     UPPER GASTROINTESTINAL ENDOSCOPY      Family History  Problem Relation Age of Onset   Stroke Father    Diabetes Father    Breast cancer Mother 15   Diverticulitis Mother    Esophageal cancer Maternal Grandfather    Colon cancer Paternal Grandmother    Suicidality Brother    Ovarian cancer Neg Hx    Stomach cancer Neg Hx    Social History   Socioeconomic History   Marital status: Legally Separated    Spouse name: Not on file   Number of children: 4   Years of education: Not on file   Highest education level: Some college, no degree  Occupational History   Occupation: Disabled  Tobacco Use   Smoking status: Former    Types: Cigarettes    Quit date: 04/08/1995    Years since quitting: 26.4   Smokeless tobacco: Never  Vaping Use   Vaping Use: Never used  Substance and Sexual Activity   Alcohol use: Not Currently    Alcohol/week: 1.0 standard drink    Types: 1 Glasses of wine per week    Comment: rare; once every 6 months   Drug use: No   Sexual activity: Yes    Partners: Male    Birth control/protection: Surgical  Other Topics Concern   Not on file  Social History Narrative   Separated - 1 son and 1 daughter   Disabled    1 caffeine/day   Past smoker   No EtOH, drugs      08/02/2018      Social  Determinants of Health   Financial Resource Strain: Low Risk    Difficulty of Paying Living Expenses: Not hard at all  Food Insecurity: No Food Insecurity   Worried About Charity fundraiser in the Last Year: Never true   Ran Out of Food in the Last Year: Never true  Transportation Needs: No Transportation Needs   Lack of Transportation (Medical): No   Lack of Transportation (Non-Medical): No  Physical Activity: Sufficiently Active   Days of Exercise per Week: 6 days   Minutes of Exercise per Session: 60 min  Stress: No Stress Concern Present   Feeling of Stress : Not at all  Social Connections: Not on file    Review of Systems  Constitutional:  Positive for fatigue and unexpected weight change. Negative for fever.  HENT:  Positive for ear pain and rhinorrhea. Negative for congestion and sore throat.   Respiratory:  Positive for cough and shortness of breath.   Cardiovascular:  Positive for chest pain.  Gastrointestinal:  Negative for abdominal pain, constipation, diarrhea and nausea.  Endocrine: Negative for polydipsia and polyphagia.  Genitourinary:  Negative for dysuria.  Musculoskeletal:  Positive for arthralgias, back pain and myalgias.  Neurological:  Positive for headaches.    Objective:  BP 110/78   Pulse 72   Temp (!) 97.3 F (36.3 C)   Resp 18   Ht 5' 4"  (1.626 m)   Wt 169 lb (76.7 kg)   BMI 29.01 kg/m   BP/Weight 09/02/2021 08/19/2021 16/11/958  Systolic BP 454 098 119  Diastolic BP 78 62 78  Wt. (Lbs) 169 164 168  BMI 29.01 28.15 28.84    Physical Exam Vitals reviewed.  Constitutional:      Appearance: Normal appearance.  Cardiovascular:     Rate and Rhythm: Normal  rate and regular rhythm.     Heart sounds: Normal heart sounds.  Pulmonary:     Effort: Pulmonary effort is normal. No respiratory distress.     Breath sounds: Normal breath sounds.  Abdominal:     General: Abdomen is flat. Bowel sounds are normal.     Palpations: Abdomen is soft.      Tenderness: There is no abdominal tenderness.  Musculoskeletal:        General: Tenderness (FM trigger points) present.  Neurological:     Mental Status: She is alert and oriented to person, place, and time.  Psychiatric:        Mood and Affect: Mood normal.        Behavior: Behavior normal.    Diabetic Foot Exam - Simple   No data filed      Lab Results  Component Value Date   WBC 4.5 08/05/2021   HGB 13.5 08/05/2021   HCT 41.4 08/05/2021   PLT 325 08/05/2021   GLUCOSE 107 (H) 08/05/2021   CHOL 288 (H) 08/05/2021   TRIG 117 08/05/2021   HDL 69 08/05/2021   LDLDIRECT 222 (H) 04/26/2019   LDLCALC 199 (H) 08/05/2021   ALT 16 08/05/2021   AST 17 08/05/2021   NA 144 08/05/2021   K 4.1 08/05/2021   CL 102 08/05/2021   CREATININE 0.75 08/05/2021   BUN 22 08/05/2021   CO2 26 08/05/2021   TSH 4.020 05/01/2021   INR 0.9 08/11/2017   HGBA1C 5.8 (H) 08/05/2021      Assessment & Plan:   Problem List Items Addressed This Visit       Cardiovascular and Mediastinum   Essential hypertension    Well controlled. The current medical regimen is effective;  continue present plan and medications.         Respiratory   Moderate persistent asthma    Continue symbicort low dose 2 puffs twice a day        Other   Fibromyalgia - Primary (Chronic)    Start on lyrica 25 mg tid      Relevant Medications   pregabalin (LYRICA) 25 MG capsule   Overweight with body mass index (BMI) of 28 to 28.9 in adult    Continue phentermine 15 mg once daily      Other chest pain    EKG NSR, no ST changes.  Previous cardiac work up negative.  Pt has risk factors for heart disease which need to be worked on.  bp control. Chol poor control. Working on getting repatha paid for.  Likely FM pain      Relevant Orders   EKG 12-Lead (Completed)   Other Visit Diagnoses     Encounter for immunization       Relevant Orders   Flu Vaccine MDCK QUAD PF (Completed)     .  Meds ordered  this encounter  Medications   pregabalin (LYRICA) 25 MG capsule    Sig: Take 1 capsule (25 mg total) by mouth 3 (three) times daily.    Dispense:  90 capsule    Refill:  0   phentermine 15 MG capsule    Sig: Take 1 capsule (15 mg total) by mouth every morning.    Dispense:  30 capsule    Refill:  0    Orders Placed This Encounter  Procedures   Flu Vaccine MDCK QUAD PF   EKG 12-Lead     Follow-up: Return in about 4 weeks (around 09/30/2021) for Fibromyalgia  and weight mgmt.  An After Visit Summary was printed and given to the patient.  Rochel Brome, MD [facility] [phone]

## 2021-09-02 NOTE — Patient Instructions (Addendum)
Start lyrica 25 mg one three times a day for fibromyalgia.  Continue phentermine. Follow up in 1 month

## 2021-09-06 ENCOUNTER — Encounter: Payer: Self-pay | Admitting: Family Medicine

## 2021-09-06 DIAGNOSIS — R0789 Other chest pain: Secondary | ICD-10-CM | POA: Insufficient documentation

## 2021-09-06 NOTE — Assessment & Plan Note (Signed)
Continue symbicort low dose 2 puffs twice a day

## 2021-09-06 NOTE — Assessment & Plan Note (Signed)
Continue phentermine 15 mg once daily

## 2021-09-06 NOTE — Assessment & Plan Note (Addendum)
Well controlled.  The current medical regimen is effective;  continue present plan and medications. 

## 2021-09-06 NOTE — Assessment & Plan Note (Signed)
EKG NSR, no ST changes.  Previous cardiac work up negative.  Pt has risk factors for heart disease which need to be worked on.  bp control. Chol poor control. Working on getting repatha paid for.  Likely FM pain

## 2021-09-06 NOTE — Assessment & Plan Note (Signed)
Start on lyrica 25 mg tid

## 2021-09-07 ENCOUNTER — Ambulatory Visit: Payer: Medicare Other | Admitting: Neurology

## 2021-09-07 ENCOUNTER — Encounter: Payer: Self-pay | Admitting: Neurology

## 2021-09-07 ENCOUNTER — Telehealth: Payer: Self-pay | Admitting: Neurology

## 2021-09-07 VITALS — BP 135/85 | HR 72 | Ht 64.0 in | Wt 170.0 lb

## 2021-09-07 DIAGNOSIS — G43711 Chronic migraine without aura, intractable, with status migrainosus: Secondary | ICD-10-CM

## 2021-09-07 MED ORDER — SUMATRIPTAN SUCCINATE 25 MG PO TABS
25.0000 mg | ORAL_TABLET | ORAL | 3 refills | Status: DC | PRN
Start: 2021-09-07 — End: 2023-09-13

## 2021-09-07 MED ORDER — METHYLPREDNISOLONE 4 MG PO TBPK: ORAL_TABLET | ORAL | 0 refills | Status: DC

## 2021-09-07 NOTE — Progress Notes (Signed)
[facility]  PATIENT: Laura [name] DOB: October 04, 1966  REFERRING CLINICIAN: Lillard Anes,* HISTORY FROM: Patient  REASON FOR VISIT: Worsening headaches since January    HISTORICAL  CHIEF COMPLAINT:  Chief Complaint  Patient presents with   New Patient (Initial Visit)    Rm 12, alone, internal referral for Chronic tension-type headache, intractable States headaches started post Covid jan 2022, she does not take anything for headaches,  C/o headache today w/ sound sensitivity     HISTORY OF PRESENT ILLNESS:  This is a 55 year old woman with multiple medical conditions including fibromyalgia, anxiety/depression, hyperlipidemia, kidney stone, prediabetes who is presenting with complaint of constant headache since January 22.  Patient said in January she was diagnosed with COVID-19, since that diagnosis she has been having constant headache.  Headaches are described as pressure squeezing type of pain on top of head which is constant, associated with photophobia and phonophobia, and nausea but no vomiting.  She does have auras described as seeing lights on her peripheral vision prior to the headaches.  Patient said prior to her diagnosis of COVID-19 in January 22 she never had headaches.  No previous history of headaches.  Headaches can last days, she goes to bed with a headache and will wake up with a headache.  Currently she has been having headache for the past week.  She has an extensive allergy/adverse reaction to medications, has not tried ibuprofen or Tylenol due to gastritis, she also has not tried any preventive medication for the headaches.  She did mention that at some point her provider gave her gave her a steroid for another indication and noted that her headaches improve while she was on the steroid.   She reported she is on the a lot of stress, currently living RV, her sleep is described as terrible, she has a smart watch that tells her that her sleep  is poor.   Headache History and Characteristics: Onset: Jan  2022 Location: Top of head,  Quality:  Pressure, squeezing type pain  Intensity:9 /10.  Duration: Can last up to week Migrainous Features: Photophobia, phonophobia, nausea.  Aura: Yes, seeling light  History of brain injury or tumor: No  Family history: No  Motion sickness: no Cardiac history: no  OTC: None  Caffeine: No  Sleep: Sleep is terrible, has a smart watch that tells her that sleep is poor  Mood/ Stress: Live in RV, has a lot of stress  Prior prophylaxis: Propranolol: No  Verapamil:No TCA: No Topamax: No Depakote: No Effexor: No Cymbalta: No Neurontin:No  Prior abortives: Triptan: No Anti-emetic: No Steroids: No Ergotamine suppository: No    OTHER MEDICAL CONDITIONS: fibromyalgia, Depression, Anxiety, kidney stone, HLD,    REVIEW OF SYSTEMS: Full 14 system review of systems performed and negative with exception of: as noted in the HPI  ALLERGIES: Allergies  Allergen Reactions   Meperidine Hives   Shellfish Allergy Shortness Of Breath and Swelling   Diltiazem Swelling   Acebutolol Swelling and Other (See Comments)   Amlodipine     Swelling    Celebrex [Celecoxib] Swelling    Patient began taking for knee pain and started swelling (hands, feet, face).    Codeine Hives and Nausea And Vomiting   Dexlansoprazole Other (See Comments)    Abdominal pain   Dronedarone Swelling and Other (See Comments)   Duloxetine Hcl Other (See Comments)    Made pt feel crazy   Flecainide Swelling   Gabapentin Other (See Comments)  Makes her feel crazy    Hydrocodone Other (See Comments)    Keeps patient awake.   Losartan     Swelling    Metoprolol Swelling   Mexiletine     Swelling - hands, legs, face   Mirtazapine Swelling   Omeprazole Other (See Comments)    Abdominal pain   Oxycodone Itching   Repatha [Evolocumab]     Malaise    Sectral [Acebutolol Hcl] Swelling   Statins Other (See  Comments)    Muscle pain   Toradol [Ketorolac Tromethamine] Itching   Tramadol     Unable to sleep, makes her itch    HOME MEDICATIONS: Outpatient Medications Prior to Visit  Medication Sig Dispense Refill   albuterol (VENTOLIN HFA) 108 (90 Base) MCG/ACT inhaler Inhale 2 puffs into the lungs every 4 (four) hours as needed for wheezing or shortness of breath. 18 g 3   ALPRAZolam (XANAX) 1 MG tablet Take 1 mg by mouth 4 (four) times daily.      Blood Glucose Monitoring Suppl (ACCU-CHEK GUIDE) w/Device KIT 1 each by Does not apply route daily. 1 kit 0   budesonide-formoterol (SYMBICORT) 80-4.5 MCG/ACT inhaler Inhale 2 puffs into the lungs 2 (two) times daily. 1 each 3   EPINEPHrine 0.3 mg/0.3 mL IJ SOAJ injection Inject 0.3 mLs (0.3 mg total) into the muscle as needed for anaphylaxis. 1 each 2   Evolocumab (REPATHA) 140 MG/ML SOSY Inject into the skin.     furosemide (LASIX) 40 MG tablet Take 40 mg by mouth 2 (two) times daily.     glucose blood (ACCU-CHEK GUIDE) test strip 1 each by Other route daily in the afternoon. Use as instructed 100 each 12   hydrochlorothiazide (HYDRODIURIL) 25 MG tablet Take 25 mg by mouth as needed.     hydrOXYzine (VISTARIL) 100 MG capsule Take 1 capsule (100 mg total) by mouth 3 (three) times daily as needed for itching. 30 capsule 0   ipratropium-albuterol (DUONEB) 0.5-2.5 (3) MG/3ML SOLN Take 3 mLs by nebulization every 4 (four) hours as needed. 360 mL 2   levothyroxine (SYNTHROID) 112 MCG tablet TAKE 1 TABLET BY MOUTH  DAILY 90 tablet 3   phentermine 15 MG capsule Take 1 capsule (15 mg total) by mouth every morning. 30 capsule 0   potassium chloride SA (KLOR-CON) 20 MEQ tablet TAKE 1 TABLET BY MOUTH  DAILY 90 tablet 2   pregabalin (LYRICA) 25 MG capsule Take 1 capsule (25 mg total) by mouth 3 (three) times daily. 90 capsule 0   quinapril (ACCUPRIL) 20 MG tablet TAKE 1 TABLET BY MOUTH  TWICE DAILY 180 tablet 2   Semaglutide (OZEMPIC, 0.25 OR 0.5 MG/DOSE, [place])  Inject into the skin.     tiZANidine (ZANAFLEX) 4 MG tablet Take 4 mg by mouth 3 (three) times daily as needed.   0   zolpidem (AMBIEN) 10 MG tablet Take 10 mg by mouth at bedtime.     predniSONE (DELTASONE) 20 MG tablet Take 1 tablet (20 mg total) by mouth daily with breakfast. 1 po tid for 3 days then 1 po bid for 3 days then 1 po qd for 3 days 18 tablet 0   promethazine-dextromethorphan (PROMETHAZINE-DM) 6.25-15 MG/5ML syrup Take 5 mLs by mouth 4 (four) times daily as needed. 118 mL 0   Facility-Administered Medications Prior to Visit  Medication Dose Route Frequency Provider Last Rate Last Admin   albuterol (PROVENTIL) (2.5 MG/3ML) 0.083% nebulizer solution 2.5 mg  2.5 mg Nebulization Q4H  PRN Rochel Brome, MD        PAST MEDICAL HISTORY: Past Medical History:  Diagnosis Date   Acute GI bleeding 09/01/2019   Anxiety and depression    Bowel obstruction ([place])    Chest pain    a. 01/2016 Ex MV: Hypertensive response. Freq PVCs w/ exercise. nl EF. No ST/T changes. No ischemia.   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 03/2016 Echo: EF 60-65%, no rwma, mild MR, nl LA size, nl RV fxn.   High cholesterol    Kidney stones    Nasal septal perforation 05/12/2018   Hx of cocaine use   Palpitations    a. 03/2016 Holter: Sinus rhythm, avg HR 83, max 123, min 64. 4 PACs. 10,356 isolated PVCs, one vent couplet, 3842 V bigeminy, 4 beats NSVT->prev on BB - dc 2/2 swelling.   Prediabetes 12/23/2015   Overview:  Hba1c higher but not diabetic. Took metformin to try to lessen   Raynaud disease    Rectocele    Sleep apnea    "mild" per pt   Torn rotator cuff    left   Urinary retention with incomplete bladder emptying    Vaginal dryness, menopausal    Vaginal enterocele    Vitamin D deficiency 12/03/2014   Wears hearing aid in both ears     PAST SURGICAL HISTORY: Past Surgical History:  Procedure  Laterality Date   2 HOUR [place] STUDY N/A 10/11/2018   Procedure: 24 HOUR [place] STUDY;  Surgeon: Mauri Pole, MD;  Location: [facility];  Service: Endoscopy;  Laterality: N/A;   ABDOMINAL HYSTERECTOMY     ANKLE SURGERY     ran over by mother in car by [place]  09/02/2019   Procedure: BIOPSY;  Surgeon: Rush Landmark Telford Nab., MD;  Location: [place];  Service: Gastroenterology;;   COLONOSCOPY     COLONOSCOPY WITH PROPOFOL N/A 09/02/2019   Procedure: COLONOSCOPY WITH PROPOFOL;  Surgeon: Irving Copas., MD;  Location: [place];  Service: Gastroenterology;  Laterality: N/A;   COLPORRHAPHY  2015   posterior and enterocele ligation   CYSTOSCOPY  04/11/2017   Procedure: CYSTOSCOPY;  Surgeon: Defrancesco, Alanda Slim, MD;  Location: [facility];  Service: Gynecology;;   ESOPHAGEAL MANOMETRY N/A 10/11/2018   Procedure: ESOPHAGEAL MANOMETRY (EM);  Surgeon: Mauri Pole, MD;  Location: [facility];  Service: Endoscopy;  Laterality: N/A;   ESOPHAGOGASTRODUODENOSCOPY (EGD) WITH PROPOFOL N/A 09/02/2019   Procedure: ESOPHAGOGASTRODUODENOSCOPY (EGD) WITH PROPOFOL;  Surgeon: Rush Landmark Telford Nab., MD;  Location: [place];  Service: Gastroenterology;  Laterality: N/A;   EXTRACORPOREAL SHOCK WAVE LITHOTRIPSY Left 07/31/2020   Procedure: EXTRACORPOREAL SHOCK WAVE LITHOTRIPSY (ESWL);  Surgeon: Billey Co, MD;  Location: [facility];  Service: Urology;  Laterality: Left;   KNEE ARTHROSCOPY WITH MEDIAL MENISECTOMY Right 02/04/2020   Procedure: KNEE ARTHROSCOPY WITH PARTIAL LATERAL AND MEDIAL MENISECTOMY,  PARTIAL SYNOVECTOMY AND CHONDROPLASTY;  Surgeon: Lovell Sheehan, MD;  Location: [place];  Service: Orthopedics;  Laterality: Right;   LAPAROSCOPIC SALPINGO OOPHERECTOMY Left 04/11/2017   Procedure: LAPAROSCOPIC LEFT SALPINGO OOPHORECTOMY;  Surgeon: Brayton Mars, MD;  Location: [facility];  Service: Gynecology;  Laterality: Left;    LITHOTRIPSY     OOPHORECTOMY     PARTIAL HYSTERECTOMY     [place] IMPEDANCE STUDY N/A 10/11/2018  Procedure: [place] IMPEDANCE STUDY;  Surgeon: Mauri Pole, MD;  Location: [facility];  Service: Endoscopy;  Laterality: N/A;   PVC ABLATION N/A 01/18/2020   Procedure: PVC ABLATION;  Surgeon: Thompson Grayer, MD;  Location: [place] [facility];  Service: Cardiovascular;  Laterality: N/A;   thumb surgery     UPPER GASTROINTESTINAL ENDOSCOPY      FAMILY HISTORY: Family History  Problem Relation Age of Onset   Stroke Father    Diabetes Father    Breast cancer Mother 69   Diverticulitis Mother    Esophageal cancer Maternal Grandfather    Colon cancer Paternal Grandmother    Suicidality Brother    Ovarian cancer Neg Hx    Stomach cancer Neg Hx     SOCIAL HISTORY: Social History   Socioeconomic History   Marital status: Legally Separated    Spouse name: Not on file   Number of children: 4   Years of education: Not on file   Highest education level: Some college, no degree  Occupational History   Occupation: Disabled  Tobacco Use   Smoking status: Former    Types: Cigarettes    Quit date: 04/08/1995    Years since quitting: 26.4   Smokeless tobacco: Never  Vaping Use   Vaping Use: Never used  Substance and Sexual Activity   Alcohol use: Not Currently    Alcohol/week: 1.0 standard drink    Types: 1 Glasses of wine per week    Comment: rare; once every 6 months   Drug use: No   Sexual activity: Yes    Partners: Male    Birth control/protection: Surgical  Other Topics Concern   Not on file  Social History Narrative   Separated - 1 son and 1 daughter   Disabled    1 caffeine/day   Past smoker   No EtOH, drugs      08/02/2018      Social Determinants of Health   Financial Resource Strain: Low Risk    Difficulty of Paying Living Expenses: Not hard at all  Food Insecurity: No Food Insecurity   Worried About Charity fundraiser in the Last Year: Never true   Ran Out of  Food in the Last Year: Never true  Transportation Needs: No Transportation Needs   Lack of Transportation (Medical): No   Lack of Transportation (Non-Medical): No  Physical Activity: Sufficiently Active   Days of Exercise per Week: 6 days   Minutes of Exercise per Session: 60 min  Stress: No Stress Concern Present   Feeling of Stress : Not at all  Social Connections: Not on file  Intimate Partner Violence: Not At Risk   Fear of Current or Ex-Partner: No   Emotionally Abused: No   Physically Abused: No   Sexually Abused: No    PHYSICAL EXAM  GENERAL EXAM/CONSTITUTIONAL: Vitals:  Vitals:   09/07/21 1045  BP: 135/85  Pulse: 72  Weight: 170 lb (77.1 kg)  Height: _0  (1.626 m)   Body mass index is 29.18 kg/m. Wt Readings from Last 3 Encounters:  09/07/21 170 lb (77.1 kg)  09/02/21 169 lb (76.7 kg)  08/19/21 164 lb (74.4 kg)   Patient is in no distress; well developed, nourished and groomed; neck is supple  EYES: Pupils round and reactive to light, Visual fields full to confrontation, Extraocular movements intacts,   MUSCULOSKELETAL: Gait, strength, tone, movements noted in Neurologic exam below  NEUROLOGIC: MENTAL STATUS:  No flowsheet data found. awake, alert,  oriented to person, place and time recent and remote memory intact normal attention and concentration language fluent, comprehension intact, naming intact fund of knowledge appropriate  CRANIAL NERVE:  2nd, 3rd, 4th, 6th - pupils equal and reactive to light, visual fields full to confrontation, extraocular muscles intact, no nystagmus 5th - facial sensation symmetric 7th - facial strength symmetric 8th - hearing intact 9th - palate elevates symmetrically, uvula midline 11th - shoulder shrug symmetric 12th - tongue protrusion midline  MOTOR:  normal bulk and tone, full strength in the BUE, BLE  SENSORY:  normal and symmetric to light touch, pinprick, temperature, vibration  COORDINATION:   finger-nose-finger, fine finger movements normal  REFLEXES:  deep tendon reflexes present and symmetric  GAIT/STATION:  normal   DIAGNOSTIC DATA (LABS, IMAGING, TESTING) - I reviewed patient records, labs, notes, testing and imaging myself where available.  Lab Results  Component Value Date   WBC 4.5 08/05/2021   HGB 13.5 08/05/2021   HCT 41.4 08/05/2021   MCV 86 08/05/2021   PLT 325 08/05/2021      Component Value Date/Time   NA 144 08/05/2021 0845   NA 141 06/13/2014 0152   K 4.1 08/05/2021 0845   K 3.8 06/13/2014 0152   CL 102 08/05/2021 0845   CL 102 06/13/2014 0152   CO2 26 08/05/2021 0845   CO2 32 06/13/2014 0152   GLUCOSE 107 (H) 08/05/2021 0845   GLUCOSE 101 (H) 11/07/2019 2054   GLUCOSE 103 (H) 06/13/2014 0152   BUN 22 08/05/2021 0845   BUN 14 06/13/2014 0152   CREATININE 0.75 08/05/2021 0845   CREATININE 0.88 06/20/2019 0908   CALCIUM 9.7 08/05/2021 0845   CALCIUM 7.9 (L) 06/13/2014 0152   PROT 7.2 08/05/2021 0845   ALBUMIN 4.7 08/05/2021 0845   AST 17 08/05/2021 0845   ALT 16 08/05/2021 0845   ALKPHOS 64 08/05/2021 0845   BILITOT 0.3 08/05/2021 0845   GFRNONAA 75 05/19/2020 1454   GFRNONAA 75 06/20/2019 0908   GFRAA 86 05/19/2020 1454   GFRAA 87 06/20/2019 0908   Lab Results  Component Value Date   CHOL 288 (H) 08/05/2021   HDL 69 08/05/2021   LDLCALC 199 (H) 08/05/2021   LDLDIRECT 222 (H) 04/26/2019   TRIG 117 08/05/2021   CHOLHDL 4.2 08/05/2021   Lab Results  Component Value Date   HGBA1C 5.8 (H) 08/05/2021   Lab Results  Component Value Date   VITAMINB12 656 02/03/2021   Lab Results  Component Value Date   TSH 4.020 05/01/2021      ASSESSMENT AND PLAN  55 y.o. year old female with multiple medical conditions who is presenting with new onset intractable headaches since January 22 after being diagnosed with COVID-19.  Patient has not tried any preventive medication, and also has not tried any abortive medication due to possible  interaction.  She is on Lyrica 25 mg 3 times daily for her fibromyalgia and there is plan to uptitrate it.  She has a history of kidney stone, cannot try topiramate for preventive medication.  She also reported allergic/adverse reaction to amitriptyline and the TCAs, she cannot take beta-blockers and also had adverse reaction to multiple antidepressants.  Because she is 34 with new onset intractable headache I will obtain a MRI brain with and without contrast, I advised her to continue the Lyrica and go with the plan to increase it and I will give her a Medrol Dosepak.  Also I prescribed her sumatriptan to take as needed  for headache.  I will see her in 3 months for follow-up.  I also advised her to see a psychiatrist and also her therapist to manage her anxiety depression and fibromyalgia.   1. Intractable chronic migraine without aura and with status migrainosus     PLAN: Continue with Lyrica  MRI Brain  Medrol dose pack  Sumatriptan, take 1 tab as soon as the headaches start, can take an extra dose after 2 hours if headaches not resolved  Follow up in 3 months    Orders Placed This Encounter  Procedures   MR BRAIN W WO CONTRAST    Meds ordered this encounter  Medications   SUMAtriptan (IMITREX) 25 MG tablet    Sig: Take 1 tablet (25 mg total) by mouth every 2 (two) hours as needed for migraine. May repeat in 2 hours if headache persists or recurs.    Dispense:  10 tablet    Refill:  3   methylPREDNISolone (MEDROL DOSEPAK) 4 MG TBPK tablet    Sig: 6 tabs on day1 5 tabs on day2 4 tabs on day3 3 tabs on day4 2 tabs on day5  1 tab on day6    Dispense:  21 tablet    Refill:  0    Return in about 3 months (around 12/08/2021).    Alric Ran, MD 09/08/2021, 11:39 AM  [facility] [address] [place], [place] [address] [phone]

## 2021-09-07 NOTE — Patient Instructions (Signed)
Continue with Lyrica  MRI Brain  Medrol dose pack  Sumatriptan, take 1 tab as soon as the headaches start, can take an extra dose after 2 hours if headaches not resolved  Follow up in 3 months

## 2021-09-07 NOTE — Telephone Encounter (Signed)
[employer] medicare order sent to [facility], [facility] they will reach out to the patient to schedule.  

## 2021-09-18 ENCOUNTER — Other Ambulatory Visit: Payer: Self-pay | Admitting: Legal Medicine

## 2021-09-18 DIAGNOSIS — I10 Essential (primary) hypertension: Secondary | ICD-10-CM

## 2021-09-29 ENCOUNTER — Other Ambulatory Visit: Payer: Self-pay

## 2021-09-29 ENCOUNTER — Ambulatory Visit
Admission: RE | Admit: 2021-09-29 | Discharge: 2021-09-29 | Disposition: A | Discharge status: Home / Self Care | Payer: Medicare Other | Source: Ambulatory Visit | Attending: Neurology | Admitting: Neurology

## 2021-09-29 DIAGNOSIS — G43711 Chronic migraine without aura, intractable, with status migrainosus: Secondary | ICD-10-CM

## 2021-09-29 MED ORDER — GADOBENATE DIMEGLUMINE 529 MG/ML IV SOLN
15.0000 mL | Freq: Once | INTRAVENOUS | Status: AC | PRN
  Administered 2021-09-29: 15 mL via INTRAVENOUS

## 2021-10-06 ENCOUNTER — Ambulatory Visit: Payer: Medicare Other | Admitting: Family Medicine

## 2021-10-07 ENCOUNTER — Other Ambulatory Visit: Payer: Self-pay

## 2021-10-07 ENCOUNTER — Encounter: Payer: Self-pay | Admitting: Family Medicine

## 2021-10-07 ENCOUNTER — Ambulatory Visit (INDEPENDENT_AMBULATORY_CARE_PROVIDER_SITE_OTHER): Payer: Medicare Other | Admitting: Family Medicine

## 2021-10-07 VITALS — BP 128/78 | HR 60 | Temp 97.8°F | Ht 64.0 in | Wt 164.0 lb

## 2021-10-07 DIAGNOSIS — E782 Mixed hyperlipidemia: Secondary | ICD-10-CM

## 2021-10-07 DIAGNOSIS — J4540 Moderate persistent asthma, uncomplicated: Secondary | ICD-10-CM | POA: Diagnosis not present

## 2021-10-07 DIAGNOSIS — U071 COVID-19: Secondary | ICD-10-CM

## 2021-10-07 DIAGNOSIS — J069 Acute upper respiratory infection, unspecified: Secondary | ICD-10-CM

## 2021-10-07 DIAGNOSIS — B029 Zoster without complications: Secondary | ICD-10-CM | POA: Diagnosis not present

## 2021-10-07 DIAGNOSIS — M797 Fibromyalgia: Secondary | ICD-10-CM | POA: Diagnosis not present

## 2021-10-07 MED ORDER — EZETIMIBE 10 MG PO TABS: 10.0000 mg | ORAL_TABLET | Freq: Every day | ORAL | 2 refills | Status: DC

## 2021-10-07 MED ORDER — METHYLPREDNISOLONE 4 MG PO TBPK: ORAL_TABLET | ORAL | 0 refills | Status: DC

## 2021-10-07 MED ORDER — VALACYCLOVIR HCL 500 MG PO TABS: 500.0000 mg | ORAL_TABLET | Freq: Three times a day (TID) | ORAL | 0 refills | Status: DC

## 2021-10-07 MED ORDER — MONTELUKAST SODIUM 10 MG PO TABS: 10.0000 mg | ORAL_TABLET | Freq: Every day | ORAL | 3 refills | Status: DC

## 2021-10-07 NOTE — Progress Notes (Signed)
Subjective:  Patient ID: Laura [name], female    DOB: 1966/09/26  Age: 55 y.o. MRN: [id number]  HPI: Asthma: ON symbicort 80/4.5 2 puffs twice a day.  Started albuterol for rescue inhaler. Has not used it since starting symbicort.   Hyperlipidemia: pt is intolerant to statins and repatha. She tried repatha again and causes malaise. So stopped it.   Overweight: Stopped phentermine due irregular HR/Palpitations. Denies any new or different chest pain. Trial on ozempic and caused gi upset. P tis prediabetic. Had to stop ozempic.  Fibromyalgia: Current medications: Patient stopped the lyrica (25 mg tid) because she stated that it made her twitch, and it did not help her pain.   Migraines: Pt saw neurology and put her on medrol dosepak for migraines. Helped migraine, but did not cause adverse side effects. Prednisone causes anxiety and suicidal thoughts.     Current Outpatient Medications on File Prior to Visit  Medication Sig Dispense Refill   albuterol (VENTOLIN HFA) 108 (90 Base) MCG/ACT inhaler Inhale 2 puffs into the lungs every 4 (four) hours as needed for wheezing or shortness of breath. 18 g 3   ALPRAZolam (XANAX) 1 MG tablet Take 1 mg by mouth 4 (four) times daily.      Blood Glucose Monitoring Suppl (ACCU-CHEK GUIDE) w/Device KIT 1 each by Does not apply route daily. 1 kit 0   budesonide-formoterol (SYMBICORT) 80-4.5 MCG/ACT inhaler Inhale 2 puffs into the lungs 2 (two) times daily. 1 each 3   EPINEPHrine 0.3 mg/0.3 mL IJ SOAJ injection Inject 0.3 mLs (0.3 mg total) into the muscle as needed for anaphylaxis. 1 each 2   furosemide (LASIX) 40 MG tablet Take 40 mg by mouth 2 (two) times daily.     glucose blood (ACCU-CHEK GUIDE) test strip 1 each by Other route daily in the afternoon. Use as instructed 100 each 12   hydrochlorothiazide (HYDRODIURIL) 25 MG tablet Take 25 mg by mouth as needed.     hydrOXYzine (VISTARIL) 100 MG capsule Take 1 capsule (100 mg total) by mouth 3 (three) times  daily as needed for itching. 30 capsule 0   ipratropium-albuterol (DUONEB) 0.5-2.5 (3) MG/3ML SOLN Take 3 mLs by nebulization every 4 (four) hours as needed. 360 mL 2   levothyroxine (SYNTHROID) 112 MCG tablet TAKE 1 TABLET BY MOUTH  DAILY 90 tablet 3   potassium chloride SA (KLOR-CON) 20 MEQ tablet TAKE 1 TABLET BY MOUTH  DAILY 90 tablet 3   quinapril (ACCUPRIL) 20 MG tablet TAKE 1 TABLET BY MOUTH  TWICE DAILY 180 tablet 2   SUMAtriptan (IMITREX) 25 MG tablet Take 1 tablet (25 mg total) by mouth every 2 (two) hours as needed for migraine. May repeat in 2 hours if headache persists or recurs. 10 tablet 3   tiZANidine (ZANAFLEX) 4 MG tablet Take 4 mg by mouth 3 (three) times daily as needed.   0   zolpidem (AMBIEN) 10 MG tablet Take 10 mg by mouth at bedtime.     Evolocumab (REPATHA) 140 MG/ML SOSY Inject into the skin. (Patient not taking: Reported on 10/07/2021)     phentermine 15 MG capsule Take 1 capsule (15 mg total) by mouth every morning. (Patient not taking: Reported on 10/07/2021) 30 capsule 0   pregabalin (LYRICA) 25 MG capsule Take 1 capsule (25 mg total) by mouth 3 (three) times daily. (Patient not taking: Reported on 10/07/2021) 90 capsule 0   Semaglutide (OZEMPIC, 0.25 OR 0.5 MG/DOSE, [place]) Inject into the skin. (Patient not  taking: Reported on 10/07/2021)     Current Facility-Administered Medications on File Prior to Visit  Medication Dose Route Frequency Provider Last Rate Last Admin   albuterol (PROVENTIL) (2.5 MG/3ML) 0.083% nebulizer solution 2.5 mg  2.5 mg Nebulization Q4H PRN Rochel Brome, MD       Past Medical History:  Diagnosis Date   Acute GI bleeding 09/01/2019   Anxiety and depression    Bowel obstruction ([place])    Chest pain    a. 01/2016 Ex MV: Hypertensive response. Freq PVCs w/ exercise. nl EF. No ST/T changes. No ischemia.   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 03/2016  Echo: EF 60-65%, no rwma, mild MR, nl LA size, nl RV fxn.   High cholesterol    Kidney stones    Nasal septal perforation 05/12/2018   Hx of cocaine use   Palpitations    a. 03/2016 Holter: Sinus rhythm, avg HR 83, max 123, min 64. 4 PACs. 10,356 isolated PVCs, one vent couplet, 3842 V bigeminy, 4 beats NSVT->prev on BB - dc 2/2 swelling.   Prediabetes 12/23/2015   Overview:  Hba1c higher but not diabetic. Took metformin to try to lessen   Raynaud disease    Rectocele    Sleep apnea    "mild" per pt   Torn rotator cuff    left   Urinary retention with incomplete bladder emptying    Vaginal dryness, menopausal    Vaginal enterocele    Vitamin D deficiency 12/03/2014   Wears hearing aid in both ears    Past Surgical History:  Procedure Laterality Date   78 HOUR [place] STUDY N/A 10/11/2018   Procedure: 24 HOUR PH STUDY;  Surgeon: Mauri Pole, MD;  Location: [facility];  Service: Endoscopy;  Laterality: N/A;   ABDOMINAL HYSTERECTOMY     ANKLE SURGERY     ran over by mother in car by [place]  09/02/2019   Procedure: BIOPSY;  Surgeon: Rush Landmark Telford Nab., MD;  Location: [facility];  Service: Gastroenterology;;   COLONOSCOPY     COLONOSCOPY WITH PROPOFOL N/A 09/02/2019   Procedure: COLONOSCOPY WITH PROPOFOL;  Surgeon: Irving Copas., MD;  Location: [facility];  Service: Gastroenterology;  Laterality: N/A;   COLPORRHAPHY  2015   posterior and enterocele ligation   CYSTOSCOPY  04/11/2017   Procedure: CYSTOSCOPY;  Surgeon: Defrancesco, Alanda Slim, MD;  Location: [facility];  Service: Gynecology;;   ESOPHAGEAL MANOMETRY N/A 10/11/2018   Procedure: ESOPHAGEAL MANOMETRY (EM);  Surgeon: Mauri Pole, MD;  [facility]: [facility];  Service: Endoscopy;  Laterality: N/A;   ESOPHAGOGASTRODUODENOSCOPY (EGD) WITH PROPOFOL N/A 09/02/2019   Procedure: ESOPHAGOGASTRODUODENOSCOPY (EGD) WITH PROPOFOL;  Surgeon: Rush Landmark Telford Nab., MD;  Location: [facility];  Service: Gastroenterology;  Laterality: N/A;   EXTRACORPOREAL SHOCK WAVE LITHOTRIPSY Left 07/31/2020   Procedure: EXTRACORPOREAL SHOCK WAVE LITHOTRIPSY (ESWL);  Surgeon: Billey Co, MD;  Location: [facility];  Service: Urology;  Laterality: Left;   KNEE ARTHROSCOPY WITH MEDIAL MENISECTOMY Right 02/04/2020   Procedure: KNEE ARTHROSCOPY WITH PARTIAL LATERAL AND MEDIAL MENISECTOMY,  PARTIAL SYNOVECTOMY AND CHONDROPLASTY;  Surgeon: Lovell Sheehan, MD;  Location: [facility];  Service: Orthopedics;  Laterality: Right;   LAPAROSCOPIC SALPINGO OOPHERECTOMY Left 04/11/2017   Procedure: LAPAROSCOPIC LEFT SALPINGO OOPHORECTOMY;  Surgeon: Brayton Mars,  MD;  Location: [facility];  Service: Gynecology;  Laterality: Left;   LITHOTRIPSY     OOPHORECTOMY     PARTIAL HYSTERECTOMY     [place] IMPEDANCE STUDY N/A 10/11/2018   Procedure: [place] IMPEDANCE STUDY;  Surgeon: Mauri Pole, MD;  [facility]: [facility];  Service: Endoscopy;  Laterality: N/A;   PVC ABLATION N/A 01/18/2020   Procedure: PVC ABLATION;  Surgeon: Thompson Grayer, MD;  Location: [facility];  Service: Cardiovascular;  Laterality: N/A;   thumb surgery     UPPER GASTROINTESTINAL ENDOSCOPY      Family History  Problem Relation Age of Onset   Stroke Father    Diabetes Father    Breast cancer Mother 17   Diverticulitis Mother    Esophageal cancer Maternal Grandfather    Colon cancer Paternal Grandmother    Suicidality Brother    Ovarian cancer Neg Hx    Stomach cancer Neg Hx    Social History   Socioeconomic History   Marital status: Legally Separated    Spouse name: Not on file   Number of children: 4   Years of education: Not on file   Highest education level: Some college, no degree  Occupational History   Occupation: Disabled  Tobacco Use   Smoking status: Former    Types: Cigarettes    Quit date: 04/08/1995    Years since quitting: 26.5   Smokeless tobacco: Never  Vaping Use   Vaping Use:  Never used  Substance and Sexual Activity   Alcohol use: Not Currently    Alcohol/week: 1.0 standard drink    Types: 1 Glasses of wine per week    Comment: rare; once every 6 months   Drug use: No   Sexual activity: Yes    Partners: Male    Birth control/protection: Surgical  Other Topics Concern   Not on file  Social History Narrative   Separated - 1 son and 1 daughter   Disabled    1 caffeine/day   Past smoker   No EtOH, drugs      08/02/2018      Social Determinants of Health   Financial Resource Strain: Low Risk    Difficulty of Paying Living Expenses: Not hard at all  Food Insecurity: No Food Insecurity   Worried About Charity fundraiser in the Last Year: Never true   Ran Out of Food in the Last Year: Never true  Transportation Needs: No Transportation Needs   Lack of Transportation (Medical): No   Lack of Transportation (Non-Medical): No  Physical Activity: Sufficiently Active   Days of Exercise per Week: 6 days   Minutes of Exercise per Session: 60 min  Stress: No Stress Concern Present   Feeling of Stress : Not at all  Social Connections: Not on file    Review of Systems  Constitutional:  Positive for fatigue. Negative for appetite change and fever.  HENT:  Positive for ear pain, rhinorrhea and sore throat. Negative for congestion and sinus pressure.   Eyes:  Negative for pain.  Respiratory:  Negative for cough, chest tightness, shortness of breath and wheezing.   Cardiovascular:  Negative for chest pain and palpitations.  Gastrointestinal:  Positive for abdominal pain. Negative for constipation, diarrhea, nausea and vomiting.  Endocrine: Positive for polyphagia. Negative for polydipsia.  Genitourinary:  Negative for dysuria and hematuria.  Musculoskeletal:  Positive for arthralgias, back pain and myalgias. Negative for joint swelling.  Skin:  Positive for rash (right breast. itches  and burns.).  Neurological:  Positive for headaches (migraines. given  imitrex by neurology. helping.). Negative for dizziness and weakness.  Psychiatric/Behavioral:  Negative for dysphoric mood. The patient is not nervous/anxious.     Objective:  BP 128/78 (BP Location: Right Arm, Patient Position: Sitting)   Pulse 60   Temp 97.8 F (36.6 C) (Temporal)   Ht 5' 4"  (1.626 m)   Wt 164 lb (74.4 kg)   SpO2 98%   BMI 28.15 kg/m   BP/Weight 10/07/2021 09/07/2021 10/02/4824  Systolic BP 003 704 888  Diastolic BP 78 85 78  Wt. (Lbs) 164 170 169  BMI 28.15 29.18 29.01    Physical Exam Vitals reviewed.  Constitutional:      Appearance: Normal appearance. She is normal weight.  HENT:     Right Ear: Tympanic membrane normal.     Left Ear: Tympanic membrane normal.     Nose: Congestion (erythema, yellow mucous) present.     Mouth/Throat:     Pharynx: Posterior oropharyngeal erythema present. No oropharyngeal exudate.  Neck:     Vascular: No carotid bruit.  Cardiovascular:     Rate and Rhythm: Normal rate and regular rhythm.     Pulses: Normal pulses.     Heart sounds: Normal heart sounds.  Pulmonary:     Effort: Pulmonary effort is normal. No respiratory distress.     Breath sounds: Normal breath sounds.  Abdominal:     General: Abdomen is flat. Bowel sounds are normal.     Palpations: Abdomen is soft.     Tenderness: There is abdominal tenderness (diffuse, mild. baseline).  Skin:    Findings: Rash (rt breast dermatomal, macular. no pustles.) present.  Neurological:     Mental Status: She is alert and oriented to person, place, and time.  Psychiatric:        Mood and Affect: Mood normal.        Behavior: Behavior normal.    Diabetic Foot Exam - Simple   No data filed      Lab Results  Component Value Date   WBC 4.5 08/05/2021   HGB 13.5 08/05/2021   HCT 41.4 08/05/2021   PLT 325 08/05/2021   GLUCOSE 107 (H) 08/05/2021   CHOL 288 (H) 08/05/2021   TRIG 117 08/05/2021   HDL 69 08/05/2021   LDLDIRECT 222 (H) 04/26/2019   LDLCALC 199 (H)  08/05/2021   ALT 16 08/05/2021   AST 17 08/05/2021   NA 144 08/05/2021   K 4.1 08/05/2021   CL 102 08/05/2021   CREATININE 0.75 08/05/2021   BUN 22 08/05/2021   CO2 26 08/05/2021   TSH 4.020 05/01/2021   INR 0.9 08/11/2017   HGBA1C 5.8 (H) 08/05/2021      Assessment & Plan:   Problem List Items Addressed This Visit       Respiratory   Moderate persistent asthma - Primary    Singulair 10 mg daily.  Continue symbicort 2 puffs twice a day. Check insurance for alternatives (dulera, advair, breo)      Relevant Medications   methylPREDNISolone (MEDROL DOSEPAK) 4 MG TBPK tablet   montelukast (SINGULAIR) 10 MG tablet   Upper respiratory tract infection due to COVID-19 virus    Pt called back the day after her appointment. She had started to feel worse and checked a home covid 19 test which was positive. Sent molnupiravir.      Relevant Medications   valACYclovir (VALTREX) 500 MG tablet     Other  Fibromyalgia (Chronic)    Intolerant to lyrica.      Relevant Medications   methylPREDNISolone (MEDROL DOSEPAK) 4 MG TBPK tablet   Mixed hyperlipidemia    Zetia 10 mg daily.        Relevant Medications   ezetimibe (ZETIA) 10 MG tablet   Herpes zoster without complication    Shingles: Medrol dose pack.  Valtrex.       Relevant Medications   valACYclovir (VALTREX) 500 MG tablet  .  Meds ordered this encounter  Medications   ezetimibe (ZETIA) 10 MG tablet    Sig: Take 1 tablet (10 mg total) by mouth daily.    Dispense:  30 tablet    Refill:  2   methylPREDNISolone (MEDROL DOSEPAK) 4 MG TBPK tablet    Sig: 6 tabs on day1 5 tabs on day2 4 tabs on day3 3 tabs on day4 2 tabs on day5  1 tab on day6    Dispense:  21 tablet    Refill:  0   valACYclovir (VALTREX) 500 MG tablet    Sig: Take 1 tablet (500 mg total) by mouth 3 (three) times daily.    Dispense:  21 tablet    Refill:  0   montelukast (SINGULAIR) 10 MG tablet    Sig: Take 1 tablet (10 mg total) by mouth  at bedtime.    Dispense:  30 tablet    Refill:  3    No orders of the defined types were placed in this encounter.    Follow-up: Return in about 3 months (around 01/05/2022) for chronic fasting.  An After Visit Summary was printed and given to the patient.   I,Lauren M Auman,acting as a scribe for Rochel Brome, MD.,have documented all relevant documentation on the behalf of Rochel Brome, MD,as directed by  Rochel Brome, MD while in the presence of Rochel Brome, MD.    Rochel Brome, MD [place] [phone]

## 2021-10-07 NOTE — Patient Instructions (Addendum)
Shingles: Medrol dose pack.  Valtrex.  High cholesterol.  Zetia 10 mg daily.   Allergies/Asthma: Singulair 10 mg daily.  Continue symbicort 2 puffs twice a day. Check insurance for alternatives (dulera, advair, breo)

## 2021-10-08 ENCOUNTER — Other Ambulatory Visit: Payer: Self-pay | Admitting: Family Medicine

## 2021-10-08 MED ORDER — MOLNUPIRAVIR EUA 200MG CAPSULE: 4.0000 | ORAL_CAPSULE | Freq: Two times a day (BID) | ORAL | 0 refills | Status: AC

## 2021-10-08 NOTE — Telephone Encounter (Signed)
Patient notified.  She is willing to take the anti-viral medication.  Dr. Tobie Poet advised that she start the other medications as prescribed.

## 2021-10-11 DIAGNOSIS — B029 Zoster without complications: Secondary | ICD-10-CM | POA: Insufficient documentation

## 2021-10-11 DIAGNOSIS — U071 COVID-19: Secondary | ICD-10-CM | POA: Insufficient documentation

## 2021-10-11 DIAGNOSIS — J069 Acute upper respiratory infection, unspecified: Secondary | ICD-10-CM | POA: Insufficient documentation

## 2021-10-11 HISTORY — DX: Zoster without complications: B02.9

## 2021-10-11 NOTE — Assessment & Plan Note (Signed)
Zetia 10mg daily.

## 2021-10-11 NOTE — Assessment & Plan Note (Signed)
Pt called back the day after her appointment. She had started to feel worse and checked a home covid 19 test which was positive. Sent molnupiravir.

## 2021-10-11 NOTE — Assessment & Plan Note (Signed)
Singulair 10 mg daily.  Continue symbicort 2 puffs twice a day. Check insurance for alternatives (dulera, advair, breo)

## 2021-10-11 NOTE — Assessment & Plan Note (Signed)
Shingles: Medrol dose pack.  Valtrex.

## 2021-10-11 NOTE — Assessment & Plan Note (Signed)
Intolerant to lyrica.

## 2021-10-17 ENCOUNTER — Encounter: Payer: Self-pay | Admitting: Family Medicine

## 2021-10-20 ENCOUNTER — Other Ambulatory Visit: Payer: Self-pay | Admitting: Nurse Practitioner

## 2021-10-20 ENCOUNTER — Telehealth: Payer: Self-pay

## 2021-10-20 DIAGNOSIS — J4541 Moderate persistent asthma with (acute) exacerbation: Secondary | ICD-10-CM

## 2021-10-20 NOTE — Progress Notes (Signed)
° ° °  Chronic Care Management °Pharmacy Assistant  ° °Name: Laura [name]  MRN: [id number] DOB: 09/15/1966 ° ° °Reason for Encounter: Patient Assistance ° °10/20/21 I called pt to verify if she is still taking the Ozempic. It does not look like a PAP was renewed for 2023 so I wanted to double check before will filled out an application. ° °I left vm to return my call  °  ° °Medications: °Outpatient Encounter Medications as of 10/20/2021  °Medication Sig  ° albuterol (VENTOLIN HFA) 108 (90 Base) MCG/ACT inhaler Inhale 2 puffs into the lungs every 4 (four) hours as needed for wheezing or shortness of breath.  ° ALPRAZolam (XANAX) 1 MG tablet Take 1 mg by mouth 4 (four) times daily.   ° Blood Glucose Monitoring Suppl (ACCU-CHEK GUIDE) w/Device KIT 1 each by Does not apply route daily.  ° EPINEPHrine 0.3 mg/0.3 mL IJ SOAJ injection Inject 0.3 mLs (0.3 mg total) into the muscle as needed for anaphylaxis.  ° Evolocumab (REPATHA) 140 MG/ML SOSY Inject into the skin. (Patient not taking: Reported on 10/07/2021)  ° ezetimibe (ZETIA) 10 MG tablet Take 1 tablet (10 mg total) by mouth daily.  ° furosemide (LASIX) 40 MG tablet Take 40 mg by mouth 2 (two) times daily.  ° glucose blood (ACCU-CHEK GUIDE) test strip 1 each by Other route daily in the afternoon. Use as instructed  ° hydrochlorothiazide (HYDRODIURIL) 25 MG tablet Take 25 mg by mouth as needed.  ° hydrOXYzine (VISTARIL) 100 MG capsule Take 1 capsule (100 mg total) by mouth 3 (three) times daily as needed for itching.  ° ipratropium-albuterol (DUONEB) 0.5-2.5 (3) MG/3ML SOLN Take 3 mLs by nebulization every 4 (four) hours as needed.  ° levothyroxine (SYNTHROID) 112 MCG tablet TAKE 1 TABLET BY MOUTH  DAILY  ° methylPREDNISolone (MEDROL DOSEPAK) 4 MG TBPK tablet 6 tabs on day1 5 tabs on day2 4 tabs on day3 3 tabs on day4 2 tabs on day5  1 tab on day6  ° montelukast (SINGULAIR) 10 MG tablet Take 1 tablet (10 mg total) by mouth at bedtime.  ° phentermine 15 MG capsule Take 1  capsule (15 mg total) by mouth every morning. (Patient not taking: Reported on 10/07/2021)  ° potassium chloride SA (KLOR-CON) 20 MEQ tablet TAKE 1 TABLET BY MOUTH  DAILY  ° pregabalin (LYRICA) 25 MG capsule Take 1 capsule (25 mg total) by mouth 3 (three) times daily. (Patient not taking: Reported on 10/07/2021)  ° quinapril (ACCUPRIL) 20 MG tablet TAKE 1 TABLET BY MOUTH  TWICE DAILY  ° Semaglutide (OZEMPIC, 0.25 OR 0.5 MG/DOSE, [place]) Inject into the skin. (Patient not taking: Reported on 10/07/2021)  ° SUMAtriptan (IMITREX) 25 MG tablet Take 1 tablet (25 mg total) by mouth every 2 (two) hours as needed for migraine. May repeat in 2 hours if headache persists or recurs.  ° SYMBICORT 80-4.5 MCG/ACT inhaler USE 2 INHALATIONS BY MOUTH  TWICE DAILY  ° tiZANidine (ZANAFLEX) 4 MG tablet Take 4 mg by mouth 3 (three) times daily as needed.   ° valACYclovir (VALTREX) 500 MG tablet Take 1 tablet (500 mg total) by mouth 3 (three) times daily.  ° zolpidem (AMBIEN) 10 MG tablet Take 10 mg by mouth at bedtime.  ° °Facility-Administered Encounter Medications as of 10/20/2021  °Medication  ° albuterol (PROVENTIL) (2.5 MG/3ML) 0.083% nebulizer solution 2.5 mg  ° °Danielle Gerringer, CMA °Clinical Pharmacist Assistant  °[phone]  °

## 2021-10-21 ENCOUNTER — Telehealth: Payer: Self-pay

## 2021-10-21 NOTE — Telephone Encounter (Signed)
Approved through 10/31/2022 Ref # [id number]

## 2021-10-21 NOTE — Telephone Encounter (Signed)
Send to [facility] by [facility]  Key: [employer]

## 2021-10-22 NOTE — Telephone Encounter (Signed)
Approved through 10/31/2022 Ref # [id number] .

## 2021-10-29 ENCOUNTER — Encounter (HOSPITAL_COMMUNITY): Payer: Self-pay | Admitting: Emergency Medicine

## 2021-10-29 ENCOUNTER — Emergency Department (HOSPITAL_COMMUNITY): Payer: Medicare Other

## 2021-10-29 ENCOUNTER — Telehealth: Payer: Self-pay

## 2021-10-29 ENCOUNTER — Emergency Department (HOSPITAL_COMMUNITY)
Admission: EM | Admit: 2021-10-29 | Discharge: 2021-10-29 | Disposition: A | Discharge status: Home / Self Care | Payer: Medicare Other | Attending: Emergency Medicine | Admitting: Emergency Medicine

## 2021-10-29 ENCOUNTER — Other Ambulatory Visit: Payer: Self-pay

## 2021-10-29 DIAGNOSIS — E039 Hypothyroidism, unspecified: Secondary | ICD-10-CM | POA: Insufficient documentation

## 2021-10-29 DIAGNOSIS — Z8616 Personal history of COVID-19: Secondary | ICD-10-CM | POA: Diagnosis not present

## 2021-10-29 DIAGNOSIS — Z20822 Contact with and (suspected) exposure to covid-19: Secondary | ICD-10-CM | POA: Insufficient documentation

## 2021-10-29 DIAGNOSIS — I129 Hypertensive chronic kidney disease with stage 1 through stage 4 chronic kidney disease, or unspecified chronic kidney disease: Secondary | ICD-10-CM | POA: Insufficient documentation

## 2021-10-29 DIAGNOSIS — R0602 Shortness of breath: Secondary | ICD-10-CM | POA: Diagnosis not present

## 2021-10-29 DIAGNOSIS — Z7951 Long term (current) use of inhaled steroids: Secondary | ICD-10-CM | POA: Insufficient documentation

## 2021-10-29 DIAGNOSIS — N189 Chronic kidney disease, unspecified: Secondary | ICD-10-CM | POA: Diagnosis not present

## 2021-10-29 DIAGNOSIS — R1011 Right upper quadrant pain: Secondary | ICD-10-CM | POA: Diagnosis not present

## 2021-10-29 DIAGNOSIS — M797 Fibromyalgia: Secondary | ICD-10-CM | POA: Insufficient documentation

## 2021-10-29 DIAGNOSIS — Z87891 Personal history of nicotine dependence: Secondary | ICD-10-CM | POA: Diagnosis not present

## 2021-10-29 DIAGNOSIS — Z79899 Other long term (current) drug therapy: Secondary | ICD-10-CM | POA: Insufficient documentation

## 2021-10-29 DIAGNOSIS — K219 Gastro-esophageal reflux disease without esophagitis: Secondary | ICD-10-CM | POA: Diagnosis not present

## 2021-10-29 DIAGNOSIS — R7989 Other specified abnormal findings of blood chemistry: Secondary | ICD-10-CM | POA: Diagnosis not present

## 2021-10-29 DIAGNOSIS — R0789 Other chest pain: Secondary | ICD-10-CM | POA: Insufficient documentation

## 2021-10-29 DIAGNOSIS — J45909 Unspecified asthma, uncomplicated: Secondary | ICD-10-CM | POA: Diagnosis not present

## 2021-10-29 DIAGNOSIS — R079 Chest pain, unspecified: Secondary | ICD-10-CM | POA: Diagnosis not present

## 2021-10-29 DIAGNOSIS — K8020 Calculus of gallbladder without cholecystitis without obstruction: Secondary | ICD-10-CM | POA: Diagnosis not present

## 2021-10-29 DIAGNOSIS — R062 Wheezing: Secondary | ICD-10-CM | POA: Diagnosis not present

## 2021-10-29 HISTORY — DX: Zoster without complications: B02.9

## 2021-10-29 HISTORY — DX: COVID-19: U07.1

## 2021-10-29 LAB — CBC WITH DIFFERENTIAL/PLATELET
Abs Immature Granulocytes: 0.01 10*3/uL (ref 0.00–0.07)
Basophils Absolute: 0 10*3/uL (ref 0.0–0.1)
Basophils Relative: 1 %
Eosinophils Absolute: 0.1 10*3/uL (ref 0.0–0.5)
Eosinophils Relative: 2 %
HCT: 41.1 % (ref 36.0–46.0)
Hemoglobin: 13.5 g/dL (ref 12.0–15.0)
Immature Granulocytes: 0 %
Lymphocytes Relative: 34 %
Lymphs Abs: 2.1 10*3/uL (ref 0.7–4.0)
MCH: 28.8 pg (ref 26.0–34.0)
MCHC: 32.8 g/dL (ref 30.0–36.0)
MCV: 87.8 fL (ref 80.0–100.0)
Monocytes Absolute: 0.5 10*3/uL (ref 0.1–1.0)
Monocytes Relative: 8 %
Neutro Abs: 3.3 10*3/uL (ref 1.7–7.7)
Neutrophils Relative %: 55 %
Platelets: 285 10*3/uL (ref 150–400)
RBC: 4.68 MIL/uL (ref 3.87–5.11)
RDW: 15.1 % (ref 11.5–15.5)
WBC: 6 10*3/uL (ref 4.0–10.5)
nRBC: 0 % (ref 0.0–0.2)

## 2021-10-29 LAB — COMPREHENSIVE METABOLIC PANEL
ALT: 20 U/L (ref 0–44)
AST: 23 U/L (ref 15–41)
Albumin: 3.8 g/dL (ref 3.5–5.0)
Alkaline Phosphatase: 49 U/L (ref 38–126)
Anion gap: 9 (ref 5–15)
BUN: 16 mg/dL (ref 6–20)
CO2: 28 mmol/L (ref 22–32)
Calcium: 9.3 mg/dL (ref 8.9–10.3)
Chloride: 103 mmol/L (ref 98–111)
Creatinine, Ser: 0.89 mg/dL (ref 0.44–1.00)
GFR, Estimated: >60 mL/min (ref >60)
Glucose, Bld: 116 mg/dL — ABNORMAL HIGH (ref 70–99)
Potassium: 3.2 mmol/L — ABNORMAL LOW (ref 3.5–5.1)
Sodium: 140 mmol/L (ref 135–145)
Total Bilirubin: 0.4 mg/dL (ref 0.3–1.2)
Total Protein: 6.6 g/dL (ref 6.5–8.1)

## 2021-10-29 LAB — URINALYSIS, ROUTINE W REFLEX MICROSCOPIC
Bilirubin Urine: NEGATIVE
Glucose, UA: NEGATIVE mg/dL
Hgb urine dipstick: NEGATIVE
Ketones, ur: NEGATIVE mg/dL
Nitrite: NEGATIVE
Protein, ur: NEGATIVE mg/dL
Specific Gravity, Urine: 1.009 (ref 1.005–1.030)
pH: 5 (ref 5.0–8.0)

## 2021-10-29 LAB — RESP PANEL BY RT-PCR (FLU A&B, COVID) ARPGX2
Influenza A by PCR: NEGATIVE
Influenza B by PCR: NEGATIVE
SARS Coronavirus 2 by RT PCR: NEGATIVE

## 2021-10-29 LAB — TROPONIN I (HIGH SENSITIVITY)
Troponin I (High Sensitivity): 8 ng/L (ref <18)
Troponin I (High Sensitivity): 9 ng/L (ref <18)

## 2021-10-29 LAB — LIPASE, BLOOD: Lipase: 38 U/L (ref 11–51)

## 2021-10-29 LAB — D-DIMER, QUANTITATIVE: D-Dimer, Quant: 0.77 ug/mL-FEU — ABNORMAL HIGH (ref 0.00–0.50)

## 2021-10-29 MED ORDER — ALPRAZOLAM 0.25 MG PO TABS
1.0000 mg | ORAL_TABLET | Freq: Once | ORAL | Status: AC
  Administered 2021-10-29: 20:00:00 1 mg via ORAL
  Filled 2021-10-29: qty 4

## 2021-10-29 MED ORDER — IOHEXOL 350 MG/ML SOLN
65.0000 mL | Freq: Once | INTRAVENOUS | Status: AC | PRN
  Administered 2021-10-29: 21:00:00 65 mL via INTRAVENOUS

## 2021-10-29 NOTE — ED Provider Notes (Signed)
[name] Provider Note   CSN: [id number] Arrival date & time: 10/29/21  1227     History Chief Complaint  Patient presents with   Chest Pain    [name] Seen is a 55 y.o. female.  The history is provided by the patient.  Chest Pain Pain location:  R chest Pain quality: aching   Pain radiates to:  R arm and R shoulder Pain severity:  Moderate Onset quality:  Gradual Duration:  1 day Timing:  Constant Progression:  Waxing and waning Chronicity:  Recurrent Context: breathing and movement   Relieved by:  Nothing Worsened by:  Nothing Ineffective treatments:  None tried Associated symptoms: abdominal pain, anorexia, cough, headache, nausea, numbness, palpitations and shortness of breath   Associated symptoms: no diaphoresis, no fever, no orthopnea, no syncope and no vomiting    HPI: A 55 year old patient with a history of hypertension and hypercholesterolemia presents for evaluation of chest pain. Initial onset of pain was more than 6 hours ago. The patient's chest pain is described as heaviness/pressure/tightness and is worse with exertion. The patient's chest pain is not middle- or left-sided, is not well-localized, is not sharp and does radiate to the arms/jaw/neck. The patient does not complain of nausea and denies diaphoresis. The patient has no history of stroke, has no history of peripheral artery disease, has not smoked in the past 90 days, denies any history of treated diabetes, has no relevant family history of coronary artery disease (first degree relative at less than age 15) and does not have an elevated BMI (>=30).   Past Medical History:  Diagnosis Date   Acute GI bleeding 09/01/2019   Anxiety and depression    Bowel obstruction ([place])    Chest pain    a. 01/2016 Ex MV: Hypertensive response. Freq PVCs w/ exercise. nl EF. No ST/T changes. No ischemia.   Complex ovarian cyst, left 03/08/2017   COVID    COVID-19 10/2019    Cystocele    Exposure to hepatitis C    Fibromyalgia    Heart murmur    a. 03/2016 Echo: EF 60-65%, no rwma, mild MR, nl LA size, nl RV fxn.   High cholesterol    Kidney stones    Nasal septal perforation 05/12/2018   Hx of cocaine use   Palpitations    a. 03/2016 Holter: Sinus rhythm, avg HR 83, max 123, min 64. 4 PACs. 10,356 isolated PVCs, one vent couplet, 3842 V bigeminy, 4 beats NSVT->prev on BB - dc 2/2 swelling.   Prediabetes 12/23/2015   Overview:  Hba1c higher but not diabetic. 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   Overweight with body mass index (BMI) of 28 to 28.9 in adult 08/07/2021   BMI 27.0-27.9,adult 05/01/2021   ADD (attention deficit disorder) 05/01/2021   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/12/2020  Candida vaginitis 07/22/2020   Pain in joint of left shoulder 12/03/2019   Hypercalcemia 06/20/2019   Nasal septal perforation 05/12/2018   Fatty liver 08/11/2017   Sleep apnea 08/11/2017   Renal atrophy, right 07/18/2017   Encounter for weight loss counseling 05/13/2017   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   Recurrent UTI 05/10/2017   Status post hysterectomy 03/08/2017   Barrett's esophagus 11/02/2016   Difficulty with speech 10/18/2016   Memory impairment 10/18/2016   Chronic pain syndrome 10/12/2016   Chronic low back pain (Location of Tertiary source of pain) (Bilateral) (L>R) 10/12/2016   Lumbar facet joint syndrome  10/12/2016   Chronic sacroiliac joint pain 10/12/2016   Chronic shoulder pain (Location of Primary Source of Pain) (Bilateral) (R>L) 10/12/2016   Chronic neck pain (Location of Secondary source of pain) (Bilateral) (R>L) 10/12/2016   Chronic upper back pain (Bilateral) (L>R) 10/12/2016   Chronic hand pain (Bilateral) (R>L) 10/12/2016   Chronic hand pain (Right) 10/12/2016   Chronic hand pain (Left) 10/12/2016   Chronic knee pain (Right) 10/12/2016   CKD (chronic kidney disease) 10/11/2016   Vitamin B12 deficiency 07/26/2016   Anxiety 07/06/2016   Depression 07/06/2016   Raynaud disease 05/06/2016   Fibromyalgia 02/13/2016   Prediabetes 12/23/2015   Heart palpitations 12/16/2015   Heart murmur 12/16/2015   Mixed hyperlipidemia 11/19/2015   Gastroesophageal reflux disease 06/13/2015   Osteoarthrosis, generalized, multiple joints 06/06/2015   Vaginal enterocele    Urinary retention with incomplete bladder emptying    Obesity, Class I, BMI 30.0-34.9 (see actual BMI)    Rectocele    Cystocele    Hypothyroidism, unspecified 12/03/2014   Essential hypertension 12/03/2014   Vitamin D deficiency 12/03/2014   Moderate persistent asthma 12/03/2014   Insomnia 08/31/2013    Past Surgical History:  Procedure Laterality Date   63 HOUR [place] STUDY N/A 10/11/2018   Procedure: 24 HOUR PH STUDY;  Surgeon: Mauri Pole, MD;  Location: [facility];  Service: Endoscopy;  Laterality: N/A;   ABDOMINAL HYSTERECTOMY     ANKLE SURGERY     ran over by mother in car by [place]  09/02/2019   Procedure: BIOPSY;  Surgeon: Rush Landmark Telford Nab., MD;  Location: [place];  Service: Gastroenterology;;   COLONOSCOPY     COLONOSCOPY WITH PROPOFOL N/A 09/02/2019   Procedure: COLONOSCOPY WITH PROPOFOL;  Surgeon: Irving Copas., MD;  Location: [place];  Service: Gastroenterology;  Laterality: N/A;   COLPORRHAPHY  2015   posterior and enterocele ligation    CYSTOSCOPY  04/11/2017   Procedure: CYSTOSCOPY;  Surgeon: Defrancesco, Alanda Slim, MD;  Location: [facility];  Service: Gynecology;;   ESOPHAGEAL MANOMETRY N/A 10/11/2018   Procedure: ESOPHAGEAL MANOMETRY (EM);  Surgeon: Mauri Pole, MD;  Location: [facility];  Service: Endoscopy;  Laterality: N/A;   ESOPHAGOGASTRODUODENOSCOPY (EGD) WITH PROPOFOL N/A 09/02/2019   Procedure: ESOPHAGOGASTRODUODENOSCOPY (EGD) WITH PROPOFOL;  Surgeon: Rush Landmark Telford Nab., MD;  Location: [place];  Service: Gastroenterology;  Laterality: N/A;   EXTRACORPOREAL SHOCK WAVE LITHOTRIPSY Left 07/31/2020   Procedure: EXTRACORPOREAL SHOCK WAVE LITHOTRIPSY (ESWL);  Surgeon: Billey Co, MD;  Location: [facility];  Service: Urology;  Laterality: Left;   KNEE ARTHROSCOPY WITH MEDIAL MENISECTOMY Right 02/04/2020   Procedure: KNEE ARTHROSCOPY WITH PARTIAL LATERAL AND MEDIAL MENISECTOMY,  PARTIAL SYNOVECTOMY AND CHONDROPLASTY;  Surgeon: Lovell Sheehan, MD;  Location: [place];  Service: Orthopedics;  Laterality: Right;   LAPAROSCOPIC SALPINGO OOPHERECTOMY Left 04/11/2017  Procedure: LAPAROSCOPIC LEFT SALPINGO OOPHORECTOMY;  Surgeon: Brayton Mars, MD;  Location: [facility];  Service: Gynecology;  Laterality: Left;   LITHOTRIPSY     OOPHORECTOMY     PARTIAL HYSTERECTOMY     [place] IMPEDANCE STUDY N/A 10/11/2018   Procedure: [place] IMPEDANCE STUDY;  Surgeon: Mauri Pole, MD;  [facility];  Service: Endoscopy;  Laterality: N/A;   PVC ABLATION N/A 01/18/2020   Procedure: PVC ABLATION;  Surgeon: Thompson Grayer, MD;  Location: [place] [facility];  Service: Cardiovascular;  Laterality: N/A;   thumb surgery     UPPER GASTROINTESTINAL ENDOSCOPY       OB History     Gravida  4   Para  3   Term  0   Preterm  0   AB  1   Living  2      SAB  0   IAB  0   Ectopic  1   Multiple  0   Live Births              Family History  Problem Relation Age of Onset   Stroke Father     Diabetes Father    Breast cancer Mother 46   Diverticulitis Mother    Esophageal cancer Maternal Grandfather    Colon cancer Paternal Grandmother    Suicidality Brother    Ovarian cancer Neg Hx    Stomach cancer Neg Hx     Social History   Tobacco Use   Smoking status: Former    Types: Cigarettes    Quit date: 04/08/1995    Years since quitting: 26.5   Smokeless tobacco: Never  Vaping Use   Vaping Use: Never used  Substance Use Topics   Alcohol use: Not Currently    Alcohol/week: 1.0 standard drink    Types: 1 Glasses of wine per week    Comment: rare; once every 6 months   Drug use: No    Home Medications Prior to Admission medications   Medication Sig Start Date End Date Taking? Authorizing Provider  ALPRAZolam Duanne Moron) 1 MG tablet Take 1 mg by mouth 4 (four) times daily as needed for anxiety.   Yes [provider]  furosemide (LASIX) 20 MG tablet Take 20 mg by mouth 2 (two) times daily. 09/21/21  Yes [provider]  hydrochlorothiazide (HYDRODIURIL) 25 MG tablet Take 25 mg by mouth daily as needed (swelling).   Yes [provider]  ipratropium-albuterol (DUONEB) 0.5-2.5 (3) MG/3ML SOLN Take 3 mLs by nebulization every 4 (four) hours as needed. Patient taking differently: Take 3 mLs by nebulization every 4 (four) hours as needed (asthma). 08/19/21  Yes Rip Harbour, NP  levothyroxine (SYNTHROID) 112 MCG tablet TAKE 1 TABLET BY MOUTH  DAILY 05/08/21  Yes Kourtland Coopman, Kirsten, MD  potassium chloride SA (KLOR-CON) 20 MEQ tablet TAKE 1 TABLET BY MOUTH  DAILY 09/20/21  Yes Lillard Anes, MD  quinapril (ACCUPRIL) 20 MG tablet TAKE 1 TABLET BY MOUTH  TWICE DAILY 03/25/21  Yes Wellington Hampshire, MD  SUMAtriptan (IMITREX) 25 MG tablet Take 1 tablet (25 mg total) by mouth every 2 (two) hours as needed for migraine. May repeat in 2 hours if headache persists or recurs. 09/07/21  Yes Alric Ran, MD  SYMBICORT 80-4.5 MCG/ACT inhaler USE 2 INHALATIONS BY  MOUTH  TWICE DAILY 10/20/21  Yes Rip Harbour, NP  tiZANidine (ZANAFLEX) 4 MG tablet Take 8 mg by mouth at bedtime. 04/19/17  Yes [provider]  zolpidem (AMBIEN) 10 MG tablet Take 10 mg by mouth at bedtime.   Yes [provider]  albuterol (VENTOLIN HFA) 108 (90 Base) MCG/ACT inhaler Inhale 2 puffs into the lungs every 4 (four) hours as needed for wheezing or shortness of breath. 06/22/19   Delsa Grana, PA-C  Blood Glucose Monitoring Suppl (ACCU-CHEK GUIDE) w/Device KIT 1 each by Does not apply route daily. 04/30/21   Lillard Anes, MD  EPINEPHrine 0.3 mg/0.3 mL IJ SOAJ injection Inject 0.3 mLs (0.3 mg total) into the muscle as needed for anaphylaxis. 05/26/20   Lillard Anes, MD  ezetimibe (ZETIA) 10 MG tablet Take 1 tablet (10 mg total) by mouth daily. Patient not taking: Reported on 10/29/2021 10/07/21   CoxElnita Maxwell, MD  glucose blood (ACCU-CHEK GUIDE) test strip 1 each by Other route daily in the afternoon. Use as instructed 04/30/21   Lillard Anes, MD  hydrOXYzine (VISTARIL) 100 MG capsule Take 1 capsule (100 mg total) by mouth 3 (three) times daily as needed for itching. Patient not taking: Reported on 10/29/2021 11/19/20   Lillard Anes, MD  methylPREDNISolone (MEDROL DOSEPAK) 4 MG TBPK tablet 6 tabs on day1 5 tabs on day2 4 tabs on day3 3 tabs on day4 2 tabs on day5  1 tab on day6 Patient not taking: Reported on 10/29/2021 10/07/21   CoxElnita Maxwell, MD  montelukast (SINGULAIR) 10 MG tablet Take 1 tablet (10 mg total) by mouth at bedtime. Patient not taking: Reported on 10/29/2021 10/07/21   CoxElnita Maxwell, MD  phentermine 15 MG capsule Take 1 capsule (15 mg total) by mouth every morning. Patient not taking: Reported on 10/07/2021 09/02/21   CoxElnita Maxwell, MD  pregabalin (LYRICA) 25 MG capsule Take 1 capsule (25 mg total) by mouth 3 (three) times daily. Patient not taking: Reported on 10/07/2021 09/02/21   CoxElnita Maxwell, MD  valACYclovir  (VALTREX) 500 MG tablet Take 1 tablet (500 mg total) by mouth 3 (three) times daily. Patient not taking: Reported on 10/29/2021 10/07/21   CoxElnita Maxwell, MD    Allergies    Meperidine, Shellfish allergy, Diltiazem, Acebutolol, Amlodipine, Celebrex [celecoxib], Codeine, Dexlansoprazole, Dronedarone, Duloxetine hcl, Flecainide, Gabapentin, Hydrocodone, Losartan, Metoprolol, Mexiletine, Mirtazapine, Omeprazole, Oxycodone, Pregabalin, Repatha [evolocumab], Sectral [acebutolol hcl], Statins, Toradol [ketorolac tromethamine], and Tramadol  Review of Systems   Review of Systems  Constitutional:  Negative for diaphoresis and fever.  HENT:  Negative for congestion and sore throat.   Eyes:  Negative for visual disturbance.  Respiratory:  Positive for cough and shortness of breath.   Cardiovascular:  Positive for chest pain and palpitations. Negative for orthopnea and syncope.  Gastrointestinal:  Positive for abdominal pain, anorexia and nausea. Negative for vomiting.  Genitourinary:  Negative for decreased urine volume and dysuria.  Neurological:  Positive for numbness and headaches.  All other systems reviewed and are negative.  Physical Exam Updated Vital Signs BP (!) 154/96 (BP Location: Right Arm)    Pulse 83    Temp 98.3 F (36.8 C) (Oral)    Resp 14    Ht 5' 4"  (1.626 m)    Wt 74.8 kg    SpO2 99%    BMI 28.32 kg/m   Physical Exam Vitals and nursing note reviewed.  Constitutional:      General: She is not in acute distress.    Appearance: She is well-developed.  HENT:     Head: Normocephalic and atraumatic.     Right Ear: External ear normal.  Left Ear: External ear normal.     Nose: Nose normal.     Mouth/Throat:     Mouth: Mucous membranes are moist.     Pharynx: Oropharynx is clear.  Eyes:     Extraocular Movements: Extraocular movements intact.     Conjunctiva/sclera: Conjunctivae normal.     Pupils: Pupils are equal, round, and reactive to light.  Cardiovascular:     Rate  and Rhythm: Normal rate and regular rhythm.     Pulses: Normal pulses.          Radial pulses are 2+ on the right side and 2+ on the left side.       Dorsalis pedis pulses are 2+ on the right side and 2+ on the left side.     Heart sounds: Normal heart sounds. No murmur heard. Pulmonary:     Effort: Pulmonary effort is normal. No respiratory distress.     Breath sounds: Normal breath sounds.  Abdominal:     General: There is no distension.     Palpations: Abdomen is soft.     Tenderness: There is no abdominal tenderness. There is no guarding or rebound.  Musculoskeletal:        General: No swelling, deformity or signs of injury.     Cervical back: Neck supple.     Right lower leg: No edema.     Left lower leg: No edema.  Skin:    General: Skin is warm and dry.     Capillary Refill: Capillary refill takes less than 2 seconds.  Neurological:     General: No focal deficit present.     Mental Status: She is alert and oriented to person, place, and time.     GCS: GCS eye subscore is 4. GCS verbal subscore is 5. GCS motor subscore is 6.     Cranial Nerves: Cranial nerves 2-12 are intact.     Sensory: Sensory deficit present.     Motor: No weakness.     Coordination: Coordination is intact. Romberg sign negative. Finger-Nose-Finger Test normal.     Gait: Gait is intact.     Comments: Patient reports numbness and tingling in her R face, RUE, R chest, and RLE to her thigh just above the knee.  Psychiatric:        Mood and Affect: Mood normal.    ED Results / Procedures / Treatments   Labs (all labs ordered are listed, but only abnormal results are displayed) Labs Reviewed  COMPREHENSIVE METABOLIC PANEL - Abnormal; Notable for the following components:      Result Value   Potassium 3.2 (*)    Glucose, Bld 116 (*)    All other components within normal limits  URINALYSIS, ROUTINE W REFLEX MICROSCOPIC - Abnormal; Notable for the following components:   Color, Urine STRAW (*)     Leukocytes,Ua SMALL (*)    Bacteria, UA RARE (*)    All other components within normal limits  D-DIMER, QUANTITATIVE - Abnormal; Notable for the following components:   D-Dimer, Quant 0.77 (*)    All other components within normal limits  RESP PANEL BY RT-PCR (FLU A&B, COVID) ARPGX2  LIPASE, BLOOD  CBC WITH DIFFERENTIAL/PLATELET  TROPONIN I (HIGH SENSITIVITY)  TROPONIN I (HIGH SENSITIVITY)    EKG EKG Interpretation  Date/Time:  Thursday October 29 2021 15:23:50 EST Ventricular Rate:  80 PR Interval:  155 QRS Duration: 101 QT Interval:  388 QTC Calculation: 448 R Axis:   -43 Text Interpretation: Sinus  arrhythmia Ventricular premature complex Left anterior fascicular block Low voltage, precordial leads Consider anterior infarct No significant change since last tracing Confirmed by Orpah Greek [phone]) on 10/29/2021 4:18:37 PM  Radiology DG Chest 2 View  Result Date: 10/29/2021 CLINICAL DATA:  Shortness of breath. EXAM: CHEST - 2 VIEW COMPARISON:  11/20/2020. FINDINGS: The heart size is normal. Lungs are clear. No edema or effusion is present. No focal airspace disease is present. Mild degenerative changes of the thoracic spine are stable. IMPRESSION: No active cardiopulmonary disease. Electronically Signed   By: San Morelle M.D.   On: 10/29/2021 13:34   CT Angio Chest PE W and/or Wo Contrast  Result Date: 10/29/2021 CLINICAL DATA:  Pulmonary embolus suspected. Positive D-dimer. Chest pain, shortness of breath, and wheezing. Recent diagnosis of COVID and shingles. EXAM: CT ANGIOGRAPHY CHEST WITH CONTRAST TECHNIQUE: Multidetector CT imaging of the chest was performed using the standard protocol during bolus administration of intravenous contrast. Multiplanar CT image reconstructions and MIPs were obtained to evaluate the vascular anatomy. CONTRAST:  40m OMNIPAQUE IOHEXOL 350 MG/ML SOLN COMPARISON:  04/24/2018 FINDINGS: Cardiovascular: Good opacification of the  central and segmental pulmonary arteries. No focal filling defects. No evidence of significant pulmonary embolus. Normal heart size. No pericardial effusions. Normal caliber thoracic aorta. Great vessel origins are patent. Mediastinum/Nodes: No significant lymphadenopathy. Esophagus is decompressed. Thyroid gland is unremarkable. Lungs/Pleura: Mild dependent changes in the lung bases. No focal consolidation. No pleural effusions. No pneumothorax. Upper Abdomen: No acute abnormalities demonstrated in the visualized upper abdomen. Musculoskeletal: Degenerative changes.  No destructive bone lesions. Review of the MIP images confirms the above findings. IMPRESSION: 1. No evidence of acute pulmonary embolus. 2. No evidence of active pulmonary disease. Electronically Signed   By: WLucienne CapersM.D.   On: 10/29/2021 21:15   UKoreaAbdomen Limited RUQ (LIVER/GB)  Result Date: 10/29/2021 CLINICAL DATA:  55year old female with a history of right upper quadrant pain for 1 week EXAM: ULTRASOUND ABDOMEN LIMITED RIGHT UPPER QUADRANT COMPARISON:  09/02/2019, 07/15/2017 FINDINGS: Gallbladder: Gallbladder appears decompressed with small lumen. No pericholecystic fluid. Sonographic Murphy's sign positive. Wall thickness measures 3 mm. No echogenic stones/debris within the gallbladder Common bile duct: Diameter: 2 mm-3 mm Liver: No focal lesion identified. Within normal limits in parenchymal echogenicity. Portal vein is patent on color Doppler imaging with normal direction of blood flow towards the liver. Other: Similar appearance of the kidneys to the study dated 09/02/2019 IMPRESSION: Equivocal study for cholelithiasis, as the sonographic Murphy's sign is recorded as positive, however, the gallbladder is contracted with no sonographic evidence of cholelithiasis or pericholecystic fluid. Electronically Signed   By: JCorrie MckusickD.O.   On: 10/29/2021 14:49    Procedures Procedures   Medications Ordered in ED Medications   ALPRAZolam (Duanne Moron tablet 1 mg (1 mg Oral Given 10/29/21 1932)  iohexol (OMNIPAQUE) 350 MG/ML injection 65 mL (65 mLs Intravenous Contrast Given 10/29/21 2052)    ED Course  I have reviewed the triage vital signs and the nursing notes.  Pertinent labs & imaging results that were available during my care of the patient were reviewed by me and considered in my medical decision making (see chart for details).    MDM Rules/Calculators/A&P HEAR Score: 4                        Patient presents with multiple complaints including chest pain, abdominal pain, and numbness. Patient states that she has had all these symptoms  intermittently ever since being diagnosed with COVID the first week December.  She also had shingles at that time and has completed a course of paxlovid and antiviral for zoster.  Her zoster rash and pain has resolved.  Today, the patient had chest pain radiating to her right arm associated with right-sided numbness.  The patient states that she typically has some numbness and pain related to her fibromyalgia but it is not typically unilateral like this.  Differential includes fibromyalgia, atypical ACS, biliary disease, viral illness, pulmonary embolism.  EKG shows sinus arrhythmia with PVCs and left anterior fascicular block.  No significant changes compared to prior.  CXR with no acute cardiopulmonary abnormality, specifically no evidence of pneumonia, pneumothorax, or widened mediastinum.  CMP with mild hypokalemia, patient states that she takes potassium supplements at home.  UA without evidence of UTI and patient's abdominal pain appears to be more in the upper quadrants.  Right upper quadrant ultrasound ordered in first look negative for cholecystitis or cholelithiasis.  CBC is normal, no leukocytosis to suggest infection.  The patient is COVID and flu negative.  Troponin normal x2, doubt ACS.  Patient is a hear score of 4, however with 2 normal troponins, and unchanged EKG, and  right-sided pain that would be atypical, doubt ACS.  Patient has not a candidate for PERC rule, D-dimer ordered and is positive.  CTA negative for pulmonary thromboembolic disease or any other acute abnormalities in the chest.  Based on the patient's unremarkable work-up, I have low concern for emergent etiology of the patient's symptoms.  The patient's numbness and tingling on the right side of her body with preserved sensation below the right thigh does not correlate with any known stroke syndrome or neurologic distribution.  Additionally, she has no other neurologic deficits.  She has good distal pulses in all 4 extremities and I do not have suspicion for arterial occlusive disease.  Symptoms are possibly related to patient's fibromyalgia versus post-COVID syndrome.  I recommended the patient follow-up with her primary care provider to discuss her ongoing symptoms.  Discharge instructions and return precautions were discussed with the patient prior to discharge and included in the AVS.  The patient voiced understanding of these instructions and was amenable with the plan as described.  The patient was then discharged in stable condition.  Final Clinical Impression(s) / ED Diagnoses Final diagnoses:  RUQ pain  Fibromyalgia    Rx / DC Orders ED Discharge Orders     None        Fraser Busche, Amalia Hailey, MD 10/30/21 1137    Orpah Greek, MD 11/04/21 [phone]

## 2021-10-29 NOTE — ED Provider Notes (Signed)
Emergency Medicine Provider Triage Evaluation Note  Laura [name] , a 55 y.o. female  was evaluated in triage.  Pt complains of shortness of breath since 10/07/2021.  Patient was diagnosed with COVID and shingles on 10/07/2021.  Patient has associated intermittent sternal chest pain (described as pressure sensation), bilateral lower extremity swelling (right> left), dry cough, wheezing, right-sided abdominal pain.  Has tried her albuterol inhaler with no relief of her symptoms.  Patient takes Lasix. Denies anticoagulant use, hormone replacement therapy, OCPs, recent surgeries.  Patient recently returned from [place] on 10/27/2021 where she drove with breaks in between.  Denies prior history of DVT/PE. Denies fever, chills, nausea, vomiting.  Patient reports she still has her gallbladder.   Review of Systems  Positive: Chest pain, shortness of breath Negative: Fever, chills  Physical Exam  BP (!) 161/107 (BP Location: Right Arm)    Pulse 95    Temp 99.1 F (37.3 C) (Oral)    Resp (!) 22    SpO2 100%  Gen:   Awake, no distress   Resp:  Normal effort  MSK:   Moves extremities without difficulty Other:  Positive Murphy sign.  No lower extremity edema.  No tenderness to palpation to the bilateral lower extremities.  Medical Decision Making  Medically screening exam initiated at 12:52 PM.  Appropriate orders placed.  Rashawnda I Atkins was informed that the remainder of the evaluation will be completed by another provider, this initial triage assessment does not replace that evaluation, and the importance of remaining in the ED until their evaluation is complete.   Myrical Andujo A, PA-C 10/29/21 1316    Wyvonnia Dusky, MD 10/29/21 [phone]

## 2021-10-29 NOTE — Discharge Instructions (Addendum)
Your workup today was negative for emergent causes of your symptoms such as a heart attack, blood clot in your lungs, or gallbladder disease. Your symptoms may be related to your chronic fibromyalgia. You should schedule an appointment with your PCP to discuss your symptoms further should they not improve.

## 2021-10-29 NOTE — Telephone Encounter (Signed)
Patient is having swollen lymph nodes in groin area and armpits, feels like she is having abnormal HR, chest pain two nights ago, feet swollen, decrease in output. She was having pain all over especially in swollen areas but has decreased. Dx with covid on 12/7. Hx of fibromyalgia and asthma. Does take diuretics but output still decreased.   Spoke with provider while on phone with pt. She advises pt go to hospital. Pt VU.   Royce Macadamia, [employer] 10/29/21 11:18 AM

## 2021-10-29 NOTE — ED Triage Notes (Addendum)
Pt here POV with c/o chest pain, SOB, Wheezing, generalized edema since Dec 17. DX with covid and shingles Dec 7. Kidney disease and asthma history. Used inhalers with no relief.  CP tightness 3/10. Pain radiates down right arm.

## 2021-10-29 NOTE — ED Notes (Signed)
Pt stated that she wanted her BP cuff off due to the pain around her arm. Encouraged patient to place it in another location. Patient stated that it felt like a tourniquet around her arm. Patient did allow me to update her vitals.

## 2021-11-03 ENCOUNTER — Ambulatory Visit (INDEPENDENT_AMBULATORY_CARE_PROVIDER_SITE_OTHER): Payer: Medicare Other | Admitting: Family Medicine

## 2021-11-03 ENCOUNTER — Other Ambulatory Visit: Payer: Self-pay

## 2021-11-03 VITALS — BP 150/100 | HR 80 | Temp 98.5°F | Resp 16 | Wt 171.2 lb

## 2021-11-03 DIAGNOSIS — J018 Other acute sinusitis: Secondary | ICD-10-CM | POA: Diagnosis not present

## 2021-11-03 DIAGNOSIS — I10 Essential (primary) hypertension: Secondary | ICD-10-CM | POA: Diagnosis not present

## 2021-11-03 DIAGNOSIS — R053 Chronic cough: Secondary | ICD-10-CM | POA: Diagnosis not present

## 2021-11-03 DIAGNOSIS — R601 Generalized edema: Secondary | ICD-10-CM

## 2021-11-03 DIAGNOSIS — R0609 Other forms of dyspnea: Secondary | ICD-10-CM

## 2021-11-03 DIAGNOSIS — E876 Hypokalemia: Secondary | ICD-10-CM | POA: Diagnosis not present

## 2021-11-03 DIAGNOSIS — J4541 Moderate persistent asthma with (acute) exacerbation: Secondary | ICD-10-CM | POA: Diagnosis not present

## 2021-11-03 MED ORDER — HYDROCHLOROTHIAZIDE 25 MG PO TABS: 25.0000 mg | ORAL_TABLET | Freq: Every day | ORAL | 0 refills | Status: DC | PRN

## 2021-11-03 MED ORDER — CEFDINIR 300 MG PO CAPS: 300.0000 mg | ORAL_CAPSULE | Freq: Two times a day (BID) | ORAL | 0 refills | Status: DC

## 2021-11-03 MED ORDER — METHYLPREDNISOLONE 4 MG PO TBPK: ORAL_TABLET | ORAL | 0 refills | Status: DC

## 2021-11-03 NOTE — Patient Instructions (Signed)
Sinus infection: Cefdinir 300 mg twice daily for 10 days. Asthma:  methylprednisolone. Use albuterol inhaler or nebulizer machine 4 times a day. Continue Symbicort at current dose 2 puffs twice daily. Hypertension:  Start hydrochlorothiazide 25 mg once daily.  Continue Lasix at current dose.  Check in chemistry panel for potassium.  Checking BNP for possible Checking thyroid for swelling.  Checking blood count.

## 2021-11-03 NOTE — Progress Notes (Signed)
Duplicate

## 2021-11-04 LAB — CBC WITH DIFFERENTIAL/PLATELET
Basophils Absolute: 0 10*3/uL (ref 0.0–0.2)
Basos: 1 %
EOS (ABSOLUTE): 0.1 10*3/uL (ref 0.0–0.4)
Eos: 2 %
Hematocrit: 43.8 % (ref 34.0–46.6)
Hemoglobin: 14 g/dL (ref 11.1–15.9)
Immature Grans (Abs): 0 10*3/uL (ref 0.0–0.1)
Immature Granulocytes: 0 %
Lymphocytes Absolute: 2.7 10*3/uL (ref 0.7–3.1)
Lymphs: 42 %
MCH: 28.7 pg (ref 26.6–33.0)
MCHC: 32 g/dL (ref 31.5–35.7)
MCV: 90 fL (ref 79–97)
Monocytes Absolute: 0.6 10*3/uL (ref 0.1–0.9)
Monocytes: 9 %
Neutrophils Absolute: 3.1 10*3/uL (ref 1.4–7.0)
Neutrophils: 46 %
Platelets: 329 10*3/uL (ref 150–450)
RBC: 4.87 x10E6/uL (ref 3.77–5.28)
RDW: 14.1 % (ref 11.7–15.4)
WBC: 6.5 10*3/uL (ref 3.4–10.8)

## 2021-11-04 LAB — TSH: TSH: 1.89 u[IU]/mL (ref 0.450–4.500)

## 2021-11-04 LAB — COMPREHENSIVE METABOLIC PANEL
ALT: 15 IU/L (ref 0–32)
AST: 18 IU/L (ref 0–40)
Albumin/Globulin Ratio: 1.9 (ref 1.2–2.2)
Albumin: 4.4 g/dL (ref 3.8–4.9)
Alkaline Phosphatase: 62 IU/L (ref 44–121)
BUN/Creatinine Ratio: 29 — ABNORMAL HIGH (ref 9–23)
BUN: 22 mg/dL (ref 6–24)
Bilirubin Total: <0.2 mg/dL (ref 0.0–1.2)
CO2: 25 mmol/L (ref 20–29)
Calcium: 10.1 mg/dL (ref 8.7–10.2)
Chloride: 100 mmol/L (ref 96–106)
Creatinine, Ser: 0.76 mg/dL (ref 0.57–1.00)
Globulin, Total: 2.3 g/dL (ref 1.5–4.5)
Glucose: 93 mg/dL (ref 70–99)
Potassium: 4.4 mmol/L (ref 3.5–5.2)
Sodium: 139 mmol/L (ref 134–144)
Total Protein: 6.7 g/dL (ref 6.0–8.5)
eGFR: 92 mL/min/{1.73_m2} (ref >59)

## 2021-11-04 LAB — PRO B NATRIURETIC PEPTIDE: NT-Pro BNP: 50 pg/mL (ref 0–287)

## 2021-11-05 ENCOUNTER — Inpatient Hospital Stay: Payer: Medicare Other | Admitting: Nurse Practitioner

## 2021-11-06 ENCOUNTER — Encounter: Payer: Self-pay | Admitting: Family Medicine

## 2021-11-10 ENCOUNTER — Other Ambulatory Visit: Payer: Self-pay | Admitting: Family Medicine

## 2021-11-10 DIAGNOSIS — J4540 Moderate persistent asthma, uncomplicated: Secondary | ICD-10-CM

## 2021-11-10 DIAGNOSIS — J4530 Mild persistent asthma, uncomplicated: Secondary | ICD-10-CM

## 2021-11-10 NOTE — Telephone Encounter (Signed)
Send to [facility].

## 2021-11-11 DIAGNOSIS — J4530 Mild persistent asthma, uncomplicated: Secondary | ICD-10-CM | POA: Diagnosis not present

## 2021-11-24 ENCOUNTER — Ambulatory Visit (INDEPENDENT_AMBULATORY_CARE_PROVIDER_SITE_OTHER): Payer: Medicare Other | Admitting: Family Medicine

## 2021-11-24 ENCOUNTER — Other Ambulatory Visit: Payer: Self-pay

## 2021-11-24 VITALS — BP 120/72 | HR 68 | Resp 16 | Ht 64.0 in | Wt 175.0 lb

## 2021-11-24 DIAGNOSIS — R109 Unspecified abdominal pain: Secondary | ICD-10-CM | POA: Diagnosis not present

## 2021-11-24 DIAGNOSIS — M797 Fibromyalgia: Secondary | ICD-10-CM | POA: Diagnosis not present

## 2021-11-24 DIAGNOSIS — E876 Hypokalemia: Secondary | ICD-10-CM | POA: Diagnosis not present

## 2021-11-24 DIAGNOSIS — K117 Disturbances of salivary secretion: Secondary | ICD-10-CM | POA: Diagnosis not present

## 2021-11-24 DIAGNOSIS — J3489 Other specified disorders of nose and nasal sinuses: Secondary | ICD-10-CM | POA: Diagnosis not present

## 2021-11-24 DIAGNOSIS — M2559 Pain in other specified joint: Secondary | ICD-10-CM

## 2021-11-24 LAB — POCT URINALYSIS DIPSTICK
Bilirubin, UA: NEGATIVE
Blood, UA: NEGATIVE
Glucose, UA: NEGATIVE
Ketones, UA: NEGATIVE
Nitrite, UA: NEGATIVE
Protein, UA: NEGATIVE
Spec Grav, UA: 1.01 (ref 1.010–1.025)
Urobilinogen, UA: 0.2 E.U./dL
pH, UA: 6 (ref 5.0–8.0)

## 2021-11-24 MED ORDER — MUPIROCIN 2 % EX OINT: 1.0000 "application " | TOPICAL_OINTMENT | Freq: Two times a day (BID) | CUTANEOUS | 0 refills | Status: DC

## 2021-11-24 MED ORDER — TRIAMCINOLONE ACETONIDE 0.1 % MT PSTE: 1.0000 "application " | PASTE | Freq: Two times a day (BID) | OROMUCOSAL | 12 refills | Status: DC

## 2021-11-24 MED ORDER — HYDROXYZINE PAMOATE 100 MG PO CAPS: 100.0000 mg | ORAL_CAPSULE | Freq: Three times a day (TID) | ORAL | 0 refills | Status: DC | PRN

## 2021-11-24 NOTE — Progress Notes (Signed)
Subjective:  Patient ID: Laura [name], female    DOB: 1966/02/07  Age: 56 y.o. MRN: [id number]  Chief Complaint  Patient presents with   Asthma   Hypertension    HPI Sinus infection sxs improved on Cefdinir. Completed course.  Has a sore inside left nare. Oddly she will also get a sore in mouth at the same time. This has been a recurrent issue for a year. Uses antibiotic ointment.   Asthma: completed the methylprednisolone. Still on symbicort. Not using albuterol.   Hypertension:  Patient's bp increases in the evening 153/103. Start hydrochlorothiazide 25 mg once daily.  Continue Lasix at current dose.   Patient has insomnia. Sleeps 2-3 hours per night. Crashes after about a week. Patient can not sleep due to pain. Ambien does not help. Numerous sleep studies showed mild sleep apnea. Has cpap, but is unable to use it because she is currently living in RV. Even when she did wear it, it did not help.   Depression: started on savella by psychiatrist.    Current Outpatient Medications on File Prior to Visit  Medication Sig Dispense Refill   Milnacipran HCl (SAVELLA) 12.5 MG TABS Take 12.5 mg by mouth daily.     albuterol (VENTOLIN HFA) 108 (90 Base) MCG/ACT inhaler Inhale 2 puffs into the lungs every 4 (four) hours as needed for wheezing or shortness of breath. 18 g 3   ALPRAZolam (XANAX) 1 MG tablet Take 1 mg by mouth 4 (four) times daily as needed for anxiety.     Blood Glucose Monitoring Suppl (ACCU-CHEK GUIDE) w/Device KIT 1 each by Does not apply route daily. 1 kit 0   EPINEPHrine 0.3 mg/0.3 mL IJ SOAJ injection Inject 0.3 mLs (0.3 mg total) into the muscle as needed for anaphylaxis. 1 each 2   furosemide (LASIX) 20 MG tablet Take 20 mg by mouth 2 (two) times daily.     glucose blood (ACCU-CHEK GUIDE) test strip 1 each by Other route daily in the afternoon. Use as instructed 100 each 12   hydrochlorothiazide (HYDRODIURIL) 25 MG tablet Take 1 tablet (25 mg total) by mouth daily as  needed (swelling). 30 tablet 0   ipratropium-albuterol (DUONEB) 0.5-2.5 (3) MG/3ML SOLN Take 3 mLs by nebulization every 4 (four) hours as needed. (Patient taking differently: Take 3 mLs by nebulization every 4 (four) hours as needed (asthma).) 360 mL 2   levothyroxine (SYNTHROID) 112 MCG tablet TAKE 1 TABLET BY MOUTH  DAILY 90 tablet 3   montelukast (SINGULAIR) 10 MG tablet Take 1 tablet (10 mg total) by mouth at bedtime. (Patient not taking: Reported on 10/29/2021) 30 tablet 3   phentermine 15 MG capsule Take 1 capsule (15 mg total) by mouth every morning. (Patient not taking: Reported on 10/07/2021) 30 capsule 0   potassium chloride SA (KLOR-CON) 20 MEQ tablet TAKE 1 TABLET BY MOUTH  DAILY 90 tablet 3   quinapril (ACCUPRIL) 20 MG tablet TAKE 1 TABLET BY MOUTH  TWICE DAILY 180 tablet 2   SUMAtriptan (IMITREX) 25 MG tablet Take 1 tablet (25 mg total) by mouth every 2 (two) hours as needed for migraine. May repeat in 2 hours if headache persists or recurs. 10 tablet 3   SYMBICORT 80-4.5 MCG/ACT inhaler USE 2 INHALATIONS BY MOUTH  TWICE DAILY 30.6 g 3   tiZANidine (ZANAFLEX) 4 MG tablet Take 8 mg by mouth at bedtime.  0   zolpidem (AMBIEN) 10 MG tablet Take 10 mg by mouth at bedtime.  Current Facility-Administered Medications on File Prior to Visit  Medication Dose Route Frequency Provider Last Rate Last Admin   albuterol (PROVENTIL) (2.5 MG/3ML) 0.083% nebulizer solution 2.5 mg  2.5 mg Nebulization Q4H PRN Seraj Dunnam, MD       Past Medical History:  Diagnosis Date   Acute GI bleeding 09/01/2019   Anxiety and depression    Bowel obstruction ([place])    Chest pain    a. 01/2016 Ex MV: Hypertensive response. Freq PVCs w/ exercise. nl EF. No ST/T changes. No ischemia.   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 03/2016 Echo: EF 60-65%, no rwma, mild MR, nl LA size, nl RV fxn.   High  cholesterol    Kidney stones    Nasal septal perforation 05/12/2018   Hx of cocaine use   Palpitations    a. 03/2016 Holter: Sinus rhythm, avg HR 83, max 123, min 64. 4 PACs. 10,356 isolated PVCs, one vent couplet, 3842 V bigeminy, 4 beats NSVT->prev on BB - dc 2/2 swelling.   Prediabetes 12/23/2015   Overview:  Hba1c higher but not diabetic. Took metformin to try to lessen   Raynaud disease    Rectocele    Shingles    Sleep apnea    "mild" per pt   Torn rotator cuff    left   Urinary retention with incomplete bladder emptying    Vaginal dryness, menopausal    Vaginal enterocele    Vitamin D deficiency 12/03/2014   Wears hearing aid in both ears    Past Surgical History:  Procedure Laterality Date   55 HOUR [place] STUDY N/A 10/11/2018   Procedure: [place];  Surgeon: Mauri Pole, MD;  Location: [facility];  Service: Endoscopy;  Laterality: N/A;   ABDOMINAL HYSTERECTOMY     ANKLE SURGERY     ran over by mother in car by [place]  09/02/2019   Procedure: BIOPSY;  Surgeon: Rush Landmark Telford Nab., MD;  Location: [facility];  Service: Gastroenterology;;   COLONOSCOPY     COLONOSCOPY WITH PROPOFOL N/A 09/02/2019   Procedure: COLONOSCOPY WITH PROPOFOL;  Surgeon: Irving Copas., MD;  Location: [facility];  Service: Gastroenterology;  Laterality: N/A;   COLPORRHAPHY  2015   posterior and enterocele ligation   CYSTOSCOPY  04/11/2017   Procedure: CYSTOSCOPY;  Surgeon: Defrancesco, Alanda Slim, MD;  Location: [facility];  Service: Gynecology;;   ESOPHAGEAL MANOMETRY N/A 10/11/2018   Procedure: ESOPHAGEAL MANOMETRY (EM);  Surgeon: Mauri Pole, MD;  Location: [facility];  Service: Endoscopy;  Laterality: N/A;   ESOPHAGOGASTRODUODENOSCOPY (EGD) WITH PROPOFOL N/A 09/02/2019   Procedure: ESOPHAGOGASTRODUODENOSCOPY (EGD) WITH PROPOFOL;  Surgeon: Rush Landmark Telford Nab., MD;  Location: [facility];  Service: Gastroenterology;  Laterality:  N/A;   EXTRACORPOREAL SHOCK WAVE LITHOTRIPSY Left 07/31/2020   Procedure: EXTRACORPOREAL SHOCK WAVE LITHOTRIPSY (ESWL);  Surgeon: Billey Co, MD;  Location: [facility];  Service: Urology;  Laterality: Left;   KNEE ARTHROSCOPY WITH MEDIAL MENISECTOMY Right 02/04/2020   Procedure: KNEE ARTHROSCOPY WITH PARTIAL LATERAL AND MEDIAL MENISECTOMY,  PARTIAL SYNOVECTOMY AND CHONDROPLASTY;  Surgeon: Lovell Sheehan, MD;  Location: [facility];  Service: Orthopedics;  Laterality: Right;   LAPAROSCOPIC SALPINGO OOPHERECTOMY Left 04/11/2017   Procedure: LAPAROSCOPIC LEFT SALPINGO OOPHORECTOMY;  Surgeon: Brayton Mars,  MD;  Location: [facility];  Service: Gynecology;  Laterality: Left;   LITHOTRIPSY     OOPHORECTOMY     PARTIAL HYSTERECTOMY     [place] IMPEDANCE STUDY N/A 10/11/2018   Procedure: [place] IMPEDANCE STUDY;  Surgeon: Mauri Pole, MD;  Location: [facility];  Service: Endoscopy;  Laterality: N/A;   PVC ABLATION N/A 01/18/2020   Procedure: PVC ABLATION;  Surgeon: Thompson Grayer, MD;  Location: [facility];  Service: Cardiovascular;  Laterality: N/A;   thumb surgery     UPPER GASTROINTESTINAL ENDOSCOPY      Family History  Problem Relation Age of Onset   Stroke Father    Diabetes Father    Breast cancer Mother 15   Diverticulitis Mother    Esophageal cancer Maternal Grandfather    Colon cancer Paternal Grandmother    Suicidality Brother    Ovarian cancer Neg Hx    Stomach cancer Neg Hx    Social History   Socioeconomic History   Marital status: Legally Separated    Spouse name: Not on file   Number of children: 4   Years of education: Not on file   Highest education level: Some college, no degree  Occupational History   Occupation: Disabled  Tobacco Use   Smoking status: Former    Types: Cigarettes    Quit date: 04/08/1995    Years since quitting: 26.6   Smokeless tobacco: Never  Vaping Use   Vaping Use: Never used  Substance and Sexual Activity   Alcohol  use: Not Currently    Alcohol/week: 1.0 standard drink    Types: 1 Glasses of wine per week    Comment: rare; once every 6 months   Drug use: No   Sexual activity: Yes    Partners: Male    Birth control/protection: Surgical  Other Topics Concern   Not on file  Social History Narrative   Separated - 1 son and 1 daughter   Disabled    1 caffeine/day   Past smoker   No EtOH, drugs      08/02/2018      Social Determinants of Health   Financial Resource Strain: Low Risk    Difficulty of Paying Living Expenses: Not hard at all  Food Insecurity: No Food Insecurity   Worried About Charity fundraiser in the Last Year: Never true   Ran Out of Food in the Last Year: Never true  Transportation Needs: No Transportation Needs   Lack of Transportation (Medical): No   Lack of Transportation (Non-Medical): No  Physical Activity: Sufficiently Active   Days of Exercise per Week: 6 days   Minutes of Exercise per Session: 60 min  Stress: No Stress Concern Present   Feeling of Stress : Not at all  Social Connections: Not on file    Review of Systems  Constitutional:  Positive for fatigue. Negative for chills and fever.  HENT:  Negative for congestion, rhinorrhea and sore throat.   Respiratory:  Positive for cough (mild) and shortness of breath.   Cardiovascular:  Negative for chest pain.       Frequent episodes of elevated heart rate.    Gastrointestinal:  Negative for abdominal pain, constipation, diarrhea, nausea and vomiting.  Genitourinary:  Positive for flank pain (left). Negative for dysuria and urgency.  Musculoskeletal:  Positive for myalgias. Negative for back pain.  Neurological:  Positive for weakness and headaches. Negative for dizziness and light-headedness.  Psychiatric/Behavioral:  Positive for dysphoric mood. The patient  is not nervous/anxious.     Objective:  BP 120/72    Pulse 68    Resp 16    Ht _0  (1.626 m)    Wt 175 lb (79.4 kg)    BMI 30.04 kg/m   BP/Weight  11/24/2021 11/03/2021 48/54/6270  Systolic BP 350 093 818  Diastolic BP 72 299 96  Wt. (Lbs) 175 171.2 165  BMI 30.04 29.39 28.32    Physical Exam Vitals reviewed.  Constitutional:      General: She is not in acute distress.    Appearance: Normal appearance. She is normal weight.  HENT:     Right Ear: Tympanic membrane and ear canal normal.     Left Ear: Tympanic membrane and ear canal normal.     Nose: Nose normal. No congestion or rhinorrhea.     Comments: Sore in distal septum of nose on left side.     Mouth/Throat:     Pharynx: No oropharyngeal exudate or posterior oropharyngeal erythema.     Comments: Erythema around edge of tongue.  Neck:     Thyroid: No thyroid mass.     Vascular: No carotid bruit.  Cardiovascular:     Rate and Rhythm: Normal rate and regular rhythm.     Heart sounds: Normal heart sounds. No murmur heard. Pulmonary:     Effort: Pulmonary effort is normal.     Breath sounds: Normal breath sounds.  Abdominal:     General: Bowel sounds are normal.     Palpations: Abdomen is soft. There is no mass.     Tenderness: There is no abdominal tenderness.  Musculoskeletal:        General: Tenderness (FM trigger points.) present.  Lymphadenopathy:     Cervical: No cervical adenopathy.  Neurological:     Mental Status: She is alert and oriented to person, place, and time.     Cranial Nerves: No cranial nerve deficit.  Psychiatric:        Mood and Affect: Mood normal.        Behavior: Behavior normal.    Diabetic Foot Exam - Simple   No data filed      Lab Results  Component Value Date   WBC 6.5 11/03/2021   HGB 14.0 11/03/2021   HCT 43.8 11/03/2021   PLT 329 11/03/2021   GLUCOSE 95 11/24/2021   CHOL 288 (H) 08/05/2021   TRIG 117 08/05/2021   HDL 69 08/05/2021   LDLDIRECT 222 (H) 04/26/2019   LDLCALC 199 (H) 08/05/2021   ALT 22 11/24/2021   AST 21 11/24/2021   NA 134 11/24/2021   K 4.4 11/24/2021   CL 93 (L) 11/24/2021   CREATININE 0.70  11/24/2021   BUN 21 11/24/2021   CO2 29 11/24/2021   TSH 1.890 11/03/2021   INR 0.9 08/11/2017   HGBA1C 5.8 (H) 08/05/2021      Assessment & Plan:   Problem List Items Addressed This Visit       Digestive   Xerostomia    Dental paste.        Relevant Orders   Rheumatoid factor (Completed)   CYCLIC CITRUL PEPTIDE ANTIBODY, IGG/IGA (Completed)   Sedimentation rate (Completed)   C-reactive protein (Completed)   ANA w/Reflex (Completed)   Sjogren's syndrome antibods(ssa + ssb) (Completed)     Other   Fibromyalgia (Chronic)    Continue savella      Joint pain    Labs ordered.       Relevant Orders  Rheumatoid factor (Completed)   CYCLIC CITRUL PEPTIDE ANTIBODY, IGG/IGA (Completed)   Sedimentation rate (Completed)   C-reactive protein (Completed)   ANA w/Reflex (Completed)   Sjogren's syndrome antibods(ssa + ssb) (Completed)   Sore in nose    Mupirocin ointment      Hypokalemia    Check cmp      Relevant Orders   Comprehensive metabolic panel (Completed)   Flank pain - Primary    Check UA      Relevant Orders   POCT urinalysis dipstick (Completed)  .  Meds ordered this encounter  Medications   mupirocin ointment (BACTROBAN) 2 %    Sig: Apply 1 application topically 2 (two) times daily.    Dispense:  22 g    Refill:  0   triamcinolone (KENALOG) 0.1 % paste    Sig: Use as directed 1 application in the mouth or throat 2 (two) times daily.    Dispense:  5 g    Refill:  12    Orders Placed This Encounter  Procedures   Comprehensive metabolic panel   Rheumatoid factor   CYCLIC CITRUL PEPTIDE ANTIBODY, IGG/IGA   Sedimentation rate   C-reactive protein   ANA w/Reflex   Sjogren's syndrome antibods(ssa + ssb)   POCT urinalysis dipstick     Follow-up: No follow-ups on file.  An After Visit Summary was printed and given to the patient.  Rochel Brome, MD [facility] [phone]

## 2021-11-24 NOTE — Patient Instructions (Signed)
Increase lisinopril to 40 mg twice a day.  Continue hydrochlorothiazide.  Use ointment for nose Use dental paste for gums.

## 2021-11-25 LAB — COMPREHENSIVE METABOLIC PANEL
ALT: 22 IU/L (ref 0–32)
AST: 21 IU/L (ref 0–40)
Albumin/Globulin Ratio: 1.8 (ref 1.2–2.2)
Albumin: 4.6 g/dL (ref 3.8–4.9)
Alkaline Phosphatase: 62 IU/L (ref 44–121)
BUN/Creatinine Ratio: 30 — ABNORMAL HIGH (ref 9–23)
BUN: 21 mg/dL (ref 6–24)
Bilirubin Total: 0.5 mg/dL (ref 0.0–1.2)
CO2: 29 mmol/L (ref 20–29)
Calcium: 10 mg/dL (ref 8.7–10.2)
Chloride: 93 mmol/L — ABNORMAL LOW (ref 96–106)
Creatinine, Ser: 0.7 mg/dL (ref 0.57–1.00)
Globulin, Total: 2.6 g/dL (ref 1.5–4.5)
Glucose: 95 mg/dL (ref 70–99)
Potassium: 4.4 mmol/L (ref 3.5–5.2)
Sodium: 134 mmol/L (ref 134–144)
Total Protein: 7.2 g/dL (ref 6.0–8.5)
eGFR: 101 mL/min/{1.73_m2} (ref >59)

## 2021-11-25 LAB — SJOGREN'S SYNDROME ANTIBODS(SSA + SSB)
ENA SSA (RO) Ab: <0.2 AI (ref 0.0–0.9)
ENA SSB (LA) Ab: <0.2 AI (ref 0.0–0.9)

## 2021-11-25 LAB — C-REACTIVE PROTEIN: CRP: <1 mg/L (ref 0–10)

## 2021-11-25 LAB — ANA W/REFLEX: Anti Nuclear Antibody (ANA): NEGATIVE

## 2021-11-25 LAB — CYCLIC CITRUL PEPTIDE ANTIBODY, IGG/IGA: Cyclic Citrullin Peptide Ab: 1 units (ref 0–19)

## 2021-11-25 LAB — RHEUMATOID FACTOR: Rheumatoid fact SerPl-aCnc: <10 IU/mL (ref <14.0)

## 2021-11-25 LAB — SEDIMENTATION RATE: Sed Rate: 14 mm/hr (ref 0–40)

## 2021-11-28 ENCOUNTER — Encounter: Payer: Self-pay | Admitting: Family Medicine

## 2021-11-28 DIAGNOSIS — R053 Chronic cough: Secondary | ICD-10-CM | POA: Insufficient documentation

## 2021-11-28 DIAGNOSIS — J4541 Moderate persistent asthma with (acute) exacerbation: Secondary | ICD-10-CM | POA: Insufficient documentation

## 2021-11-28 DIAGNOSIS — R0609 Other forms of dyspnea: Secondary | ICD-10-CM | POA: Insufficient documentation

## 2021-11-28 DIAGNOSIS — R601 Generalized edema: Secondary | ICD-10-CM | POA: Insufficient documentation

## 2021-11-28 DIAGNOSIS — E876 Hypokalemia: Secondary | ICD-10-CM | POA: Insufficient documentation

## 2021-11-28 NOTE — Assessment & Plan Note (Signed)
Start hydrochlorothiazide 25 mg once daily.  Continue Lasix at current dose.

## 2021-11-28 NOTE — Assessment & Plan Note (Signed)
Rx: cefdinir

## 2021-11-28 NOTE — Assessment & Plan Note (Addendum)
Start hydrochlorothiazide 25 mg once daily.  Continue Lasix at current dose. Check labs

## 2021-11-28 NOTE — Assessment & Plan Note (Signed)
Rx: methylprednisolone. Use albuterol inhaler or nebulizer machine 4 times a day. Continue Symbicort at current dose 2 puffs twice daily.

## 2021-11-28 NOTE — Progress Notes (Signed)
Acute Office Visit  Subjective:    Patient ID: Laura [name], female    DOB: [birth year], 56 y.o.   MRN: [id number]  Chief Complaint  Patient presents with   Cough   Shortness of Breath    HPI: Patient is in today for hospital follow up. Patient presented to ED on 10/29/2021 for chest pain of 6 hours which was described as heaviness/pressure or tightness and worsened with exertional. Chest pain was generalized with radiation or other associated symptoms.  EKG normal,  CXR normal,  CMP: mild hypokalemai.  UA normal.  RUQ [place] normal.  CBC normal  Covid and Flu negatvie.  Trop normal x 2.  D Dimer positive, but CTA negative.   Patient is complaining of fatigue, cough, shortness of breath, right-sided chest discomfort, palpitations, leg swelling, abdominal pain (right upper quadrant), nausea, myalgias, right arm and shoulder pain, and headaches.  Many of the symptoms have been intermittent for months and some of them have been since having COVID in early December.  He has stated above her emergency department work-up was negative.  Past Medical History:  Diagnosis Date   Acute GI bleeding 09/01/2019   Anxiety and depression    Bowel obstruction ([place])    Chest pain    a. 01/2016 Ex MV: Hypertensive response. Freq PVCs w/ exercise. nl EF. No ST/T changes. No ischemia.   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 03/2016 Echo: EF 60-65%, no rwma, mild MR, nl LA size, nl RV fxn.   High cholesterol    Kidney stones    Nasal septal perforation 05/12/2018   Hx of cocaine use   Palpitations    a. 03/2016 Holter: Sinus rhythm, avg HR 83, max 123, min 64. 4 PACs. 10,356 isolated PVCs, one vent couplet, 3842 V bigeminy, 4 beats NSVT->prev on BB - dc 2/2 swelling.   Prediabetes 12/23/2015   Overview:  Hba1c higher but not diabetic. Took metformin to try to lessen   Raynaud disease     Rectocele    Shingles    Sleep apnea    "mild" per pt   Torn rotator cuff    left   Urinary retention with incomplete bladder emptying    Vaginal dryness, menopausal    Vaginal enterocele    Vitamin D deficiency 12/03/2014   Wears hearing aid in both ears     Past Surgical History:  Procedure Laterality Date   52 HOUR [place] STUDY N/A 10/11/2018   Procedure: [place];  Surgeon: Mauri Pole, MD;  Location: [facility];  Service: Endoscopy;  Laterality: N/A;   ABDOMINAL HYSTERECTOMY     ANKLE SURGERY     ran over by mother in car by [place]  09/02/2019   Procedure: BIOPSY;  Surgeon: Rush Landmark Telford Nab., MD;  Location: [place];  Service: Gastroenterology;;   COLONOSCOPY     COLONOSCOPY WITH PROPOFOL N/A 09/02/2019   Procedure: COLONOSCOPY WITH PROPOFOL;  Surgeon: Irving Copas., MD;  Location: [place];  Service: Gastroenterology;  Laterality: N/A;   COLPORRHAPHY  2015   posterior and enterocele ligation   CYSTOSCOPY  04/11/2017   Procedure: CYSTOSCOPY;  Surgeon: Defrancesco, Alanda Slim, MD;  Location: [facility];  Service: Gynecology;;   ESOPHAGEAL MANOMETRY N/A 10/11/2018  Procedure: ESOPHAGEAL MANOMETRY (EM);  Surgeon: Mauri Pole, MD;  Location: [facility];  Service: Endoscopy;  Laterality: N/A;   ESOPHAGOGASTRODUODENOSCOPY (EGD) WITH PROPOFOL N/A 09/02/2019   Procedure: ESOPHAGOGASTRODUODENOSCOPY (EGD) WITH PROPOFOL;  Surgeon: Rush Landmark Telford Nab., MD;  Location: [place];  Service: Gastroenterology;  Laterality: N/A;   EXTRACORPOREAL SHOCK WAVE LITHOTRIPSY Left 07/31/2020   Procedure: EXTRACORPOREAL SHOCK WAVE LITHOTRIPSY (ESWL);  Surgeon: Billey Co, MD;  Location: [facility];  Service: Urology;  Laterality: Left;   KNEE ARTHROSCOPY WITH MEDIAL MENISECTOMY Right 02/04/2020   Procedure: KNEE ARTHROSCOPY WITH PARTIAL LATERAL AND MEDIAL MENISECTOMY,  PARTIAL SYNOVECTOMY AND CHONDROPLASTY;  Surgeon: Lovell Sheehan, MD;  Location: [place];  Service: Orthopedics;  Laterality: Right;   LAPAROSCOPIC SALPINGO OOPHERECTOMY Left 04/11/2017   Procedure: LAPAROSCOPIC LEFT SALPINGO OOPHORECTOMY;  Surgeon: Brayton Mars, MD;  Location: [facility];  Service: Gynecology;  Laterality: Left;   LITHOTRIPSY     OOPHORECTOMY     PARTIAL HYSTERECTOMY     [place] IMPEDANCE STUDY N/A 10/11/2018   Procedure: [place] IMPEDANCE STUDY;  Surgeon: Mauri Pole, MD;  Location: [facility];  Service: Endoscopy;  Laterality: N/A;   PVC ABLATION N/A 01/18/2020   Procedure: PVC ABLATION;  Surgeon: Thompson Grayer, MD;  Location: [place] [facility];  Service: Cardiovascular;  Laterality: N/A;   thumb surgery     UPPER GASTROINTESTINAL ENDOSCOPY      Family History  Problem Relation Age of Onset   Stroke Father    Diabetes Father    Breast cancer Mother 78   Diverticulitis Mother    Esophageal cancer Maternal Grandfather    Colon cancer Paternal Grandmother    Suicidality Brother    Ovarian cancer Neg Hx    Stomach cancer Neg Hx     Social History   Socioeconomic History   Marital status: Legally Separated    Spouse name: Not on file   Number of children: 4   Years of education: Not on file   Highest education level: Some college, no degree  Occupational History   Occupation: Disabled  Tobacco Use   Smoking status: Former    Types: Cigarettes    Quit date: 04/08/1995    Years since quitting: 26.6   Smokeless tobacco: Never  Vaping Use   Vaping Use: Never used  Substance and Sexual Activity   Alcohol use: Not Currently    Alcohol/week: 1.0 standard drink    Types: 1 Glasses of wine per week    Comment: rare; once every 6 months   Drug use: No   Sexual activity: Yes    Partners: Male    Birth control/protection: Surgical  Other Topics Concern   Not on file  Social History Narrative   Separated - 1 son and 1 daughter   Disabled    1 caffeine/day   Past smoker   No EtOH, drugs       08/02/2018      Social Determinants of Health   Financial Resource Strain: Low Risk    Difficulty of Paying Living Expenses: Not hard at all  Food Insecurity: No Food Insecurity   Worried About Charity fundraiser in the Last Year: Never true   Ran Out of Food in the Last Year: Never true  Transportation Needs: No Transportation Needs   Lack of Transportation (Medical): No   Lack of Transportation (Non-Medical): No  Physical Activity: Sufficiently Active   Days of Exercise per Week: 6 days  Minutes of Exercise per Session: 60 min  Stress: No Stress Concern Present   Feeling of Stress : Not at all  Social Connections: Not on file  Intimate Partner Violence: Not At Risk   Fear of Current or Ex-Partner: No   Emotionally Abused: No   Physically Abused: No   Sexually Abused: No    Outpatient Medications Prior to Visit  Medication Sig Dispense Refill   albuterol (VENTOLIN HFA) 108 (90 Base) MCG/ACT inhaler Inhale 2 puffs into the lungs every 4 (four) hours as needed for wheezing or shortness of breath. 18 g 3   ALPRAZolam (XANAX) 1 MG tablet Take 1 mg by mouth 4 (four) times daily as needed for anxiety.     Blood Glucose Monitoring Suppl (ACCU-CHEK GUIDE) w/Device KIT 1 each by Does not apply route daily. 1 kit 0   EPINEPHrine 0.3 mg/0.3 mL IJ SOAJ injection Inject 0.3 mLs (0.3 mg total) into the muscle as needed for anaphylaxis. 1 each 2   furosemide (LASIX) 20 MG tablet Take 20 mg by mouth 2 (two) times daily.     glucose blood (ACCU-CHEK GUIDE) test strip 1 each by Other route daily in the afternoon. Use as instructed 100 each 12   ipratropium-albuterol (DUONEB) 0.5-2.5 (3) MG/3ML SOLN Take 3 mLs by nebulization every 4 (four) hours as needed. (Patient taking differently: Take 3 mLs by nebulization every 4 (four) hours as needed (asthma).) 360 mL 2   levothyroxine (SYNTHROID) 112 MCG tablet TAKE 1 TABLET BY MOUTH  DAILY 90 tablet 3   montelukast (SINGULAIR) 10 MG tablet Take 1 tablet  (10 mg total) by mouth at bedtime. (Patient not taking: Reported on 10/29/2021) 30 tablet 3   phentermine 15 MG capsule Take 1 capsule (15 mg total) by mouth every morning. (Patient not taking: Reported on 10/07/2021) 30 capsule 0   potassium chloride SA (KLOR-CON) 20 MEQ tablet TAKE 1 TABLET BY MOUTH  DAILY 90 tablet 3   quinapril (ACCUPRIL) 20 MG tablet TAKE 1 TABLET BY MOUTH  TWICE DAILY 180 tablet 2   SUMAtriptan (IMITREX) 25 MG tablet Take 1 tablet (25 mg total) by mouth every 2 (two) hours as needed for migraine. May repeat in 2 hours if headache persists or recurs. 10 tablet 3   SYMBICORT 80-4.5 MCG/ACT inhaler USE 2 INHALATIONS BY MOUTH  TWICE DAILY 30.6 g 3   tiZANidine (ZANAFLEX) 4 MG tablet Take 8 mg by mouth at bedtime.  0   zolpidem (AMBIEN) 10 MG tablet Take 10 mg by mouth at bedtime.     ezetimibe (ZETIA) 10 MG tablet Take 1 tablet (10 mg total) by mouth daily. (Patient not taking: Reported on 10/29/2021) 30 tablet 2   hydrochlorothiazide (HYDRODIURIL) 25 MG tablet Take 25 mg by mouth daily as needed (swelling).     hydrOXYzine (VISTARIL) 100 MG capsule Take 1 capsule (100 mg total) by mouth 3 (three) times daily as needed for itching. (Patient not taking: Reported on 10/29/2021) 30 capsule 0   methylPREDNISolone (MEDROL DOSEPAK) 4 MG TBPK tablet 6 tabs on day1 5 tabs on day2 4 tabs on day3 3 tabs on day4 2 tabs on day5  1 tab on day6 (Patient not taking: Reported on 10/29/2021) 21 tablet 0   pregabalin (LYRICA) 25 MG capsule Take 1 capsule (25 mg total) by mouth 3 (three) times daily. (Patient not taking: Reported on 10/07/2021) 90 capsule 0   valACYclovir (VALTREX) 500 MG tablet Take 1 tablet (500 mg total) by mouth  3 (three) times daily. (Patient not taking: Reported on 10/29/2021) 21 tablet 0   Facility-Administered Medications Prior to Visit  Medication Dose Route Frequency Provider Last Rate Last Admin   albuterol (PROVENTIL) (2.5 MG/3ML) 0.083% nebulizer solution 2.5 mg  2.5 mg  Nebulization Q4H PRN Catarina Huntley, MD        Allergies  Allergen Reactions   Meperidine Hives   Shellfish Allergy Shortness Of Breath and Swelling   Diltiazem Swelling   Singulair [Montelukast]     Swelling all over.    Zetia [Ezetimibe]     Swelling of face.    Acebutolol Swelling and Other (See Comments)   Amlodipine     Swelling    Celebrex [Celecoxib] Swelling    Patient began taking for knee pain and started swelling (hands, feet, face).    Codeine Hives and Nausea And Vomiting   Dexlansoprazole Other (See Comments)    Abdominal pain   Dronedarone Swelling and Other (See Comments)   Duloxetine Hcl Other (See Comments)    Made pt feel crazy   Flecainide Swelling   Gabapentin Other (See Comments)    Makes her feel crazy    Hydrocodone Other (See Comments)    Keeps patient awake.   Losartan     Swelling    Metoprolol Swelling   Mexiletine     Swelling - hands, legs, face   Mirtazapine Swelling   Omeprazole Other (See Comments)    Abdominal pain   Oxycodone Itching   Pregabalin Other (See Comments)    twitch   Repatha [Evolocumab]     Malaise    Sectral [Acebutolol Hcl] Swelling   Statins Other (See Comments)    Muscle pain   Toradol [Ketorolac Tromethamine] Itching   Tramadol     Unable to sleep, makes her itch    Review of Systems  Constitutional:  Positive for fatigue. Negative for chills and fever.  HENT:  Negative for congestion, rhinorrhea and sore throat.   Respiratory:  Positive for cough and shortness of breath.   Cardiovascular:  Positive for chest pain, palpitations and leg swelling.  Gastrointestinal:  Positive for abdominal pain and nausea. Negative for constipation, diarrhea and vomiting.  Genitourinary:  Negative for dysuria and urgency.  Musculoskeletal:  Positive for arthralgias (right shoulder and arm pain) and myalgias. Negative for back pain.  Neurological:  Positive for headaches. Negative for dizziness, weakness and light-headedness.   Hematological:        Lymphadenopathy in right axilla and groin   Psychiatric/Behavioral:  Negative for dysphoric mood. The patient is not nervous/anxious.       Objective:    Physical Exam Vitals reviewed.  Constitutional:      Appearance: She is well-developed.  HENT:     Right Ear: Tympanic membrane normal.     Left Ear: Tympanic membrane normal.     Nose: Congestion present. No rhinorrhea.     Comments: Sinus tenderness    Mouth/Throat:     Pharynx: No oropharyngeal exudate or posterior oropharyngeal erythema.  Cardiovascular:     Rate and Rhythm: Normal rate and regular rhythm.     Heart sounds: Normal heart sounds.  Pulmonary:     Effort: Pulmonary effort is normal.     Breath sounds: Wheezing present.  Abdominal:     General: Bowel sounds are normal.     Palpations: Abdomen is soft.     Tenderness: There is abdominal tenderness (generalized. mild.).  Neurological:     Mental Status:  She is alert.  Psychiatric:        Mood and Affect: Mood normal.        Behavior: Behavior normal.     BP (!) 150/100    Pulse 80    Temp 98.5 F (36.9 C)    Resp 16    Wt 171 lb 3.2 oz (77.7 kg)    BMI 29.39 kg/m  Wt Readings from Last 3 Encounters:  11/24/21 175 lb (79.4 kg)  11/03/21 171 lb 3.2 oz (77.7 kg)  10/29/21 165 lb (74.8 kg)    Health Maintenance Due  Topic Date Due   Zoster Vaccines- Shingrix (1 of 2) Never done   MAMMOGRAM  08/07/2020    There are no preventive care reminders to display for this patient.   Lab Results  Component Value Date   TSH 1.890 11/03/2021   Lab Results  Component Value Date   WBC 6.5 11/03/2021   HGB 14.0 11/03/2021   HCT 43.8 11/03/2021   MCV 90 11/03/2021   PLT 329 11/03/2021   Lab Results  Component Value Date   NA 134 11/24/2021   K 4.4 11/24/2021   CO2 29 11/24/2021   GLUCOSE 95 11/24/2021   BUN 21 11/24/2021   CREATININE 0.70 11/24/2021   BILITOT 0.5 11/24/2021   ALKPHOS 62 11/24/2021   AST 21 11/24/2021   ALT  22 11/24/2021   PROT 7.2 11/24/2021   ALBUMIN 4.6 11/24/2021   CALCIUM 10.0 11/24/2021   ANIONGAP 9 10/29/2021   EGFR 101 11/24/2021   Lab Results  Component Value Date   CHOL 288 (H) 08/05/2021   Lab Results  Component Value Date   HDL 69 08/05/2021   Lab Results  Component Value Date   LDLCALC 199 (H) 08/05/2021   Lab Results  Component Value Date   TRIG 117 08/05/2021   Lab Results  Component Value Date   CHOLHDL 4.2 08/05/2021   Lab Results  Component Value Date   HGBA1C 5.8 (H) 08/05/2021       Assessment & Plan:   Problem List Items Addressed This Visit       Cardiovascular and Mediastinum   Essential hypertension    Start hydrochlorothiazide 25 mg once daily.  Continue Lasix at current dose.       Relevant Medications   hydrochlorothiazide (HYDRODIURIL) 25 MG tablet     Respiratory   Acute infection of nasal sinus    Rx: cefdinir      Moderate persistent asthma with exacerbation    Rx: methylprednisolone. Use albuterol inhaler or nebulizer machine 4 times a day. Continue Symbicort at current dose 2 puffs twice daily.         Other   Chronic cough - Primary   Dyspnea on exertion    Checking BNP for possible CHF       Relevant Orders   Pro b natriuretic peptide (BNP) (Completed)   Peak flow (Completed)   Generalized edema    Start hydrochlorothiazide 25 mg once daily.  Continue Lasix at current dose. Check labs       Relevant Orders   TSH (Completed)   CBC with Differential/Platelet (Completed)   Hypokalemia    Check in chemistry panel for potassium.  Checking BNP for possible  count.      Relevant Orders   Comprehensive metabolic panel (Completed)    Meds ordered this encounter  Medications   DISCONTD: methylPREDNISolone (MEDROL DOSEPAK) 4 MG TBPK tablet    Sig: 6  tabs on day1 5 tabs on day2 4 tabs on day3 3 tabs on day4 2 tabs on day5  1 tab on day6    Dispense:  21 tablet    Refill:  0   hydrochlorothiazide  (HYDRODIURIL) 25 MG tablet    Sig: Take 1 tablet (25 mg total) by mouth daily as needed (swelling).    Dispense:  30 tablet    Refill:  0   DISCONTD: cefdinir (OMNICEF) 300 MG capsule    Sig: Take 1 capsule (300 mg total) by mouth 2 (two) times daily.    Dispense:  20 capsule    Refill:  0     Orders Placed This Encounter  Procedures   Pro b natriuretic peptide (BNP)   Comprehensive metabolic panel   TSH   CBC with Differential/Platelet   Peak flow      Follow-up: Return in about 3 weeks (around 11/24/2021) for chronic follow up.  An After Visit Summary was printed and given to the patient.  Rochel Brome, MD [facility] [phone]

## 2021-11-28 NOTE — Assessment & Plan Note (Signed)
Checking BNP for possible CHF

## 2021-11-28 NOTE — Assessment & Plan Note (Signed)
Check in chemistry panel for potassium.  Checking BNP for possible  count.

## 2021-12-01 ENCOUNTER — Other Ambulatory Visit: Payer: Self-pay

## 2021-12-01 DIAGNOSIS — Z1231 Encounter for screening mammogram for malignant neoplasm of breast: Secondary | ICD-10-CM

## 2021-12-03 DIAGNOSIS — I10 Essential (primary) hypertension: Secondary | ICD-10-CM | POA: Diagnosis not present

## 2021-12-03 DIAGNOSIS — R600 Localized edema: Secondary | ICD-10-CM | POA: Diagnosis not present

## 2021-12-06 ENCOUNTER — Encounter: Payer: Self-pay | Admitting: Family Medicine

## 2021-12-06 DIAGNOSIS — R109 Unspecified abdominal pain: Secondary | ICD-10-CM | POA: Insufficient documentation

## 2021-12-06 DIAGNOSIS — K117 Disturbances of salivary secretion: Secondary | ICD-10-CM | POA: Insufficient documentation

## 2021-12-06 NOTE — Assessment & Plan Note (Signed)
Labs ordered.

## 2021-12-06 NOTE — Assessment & Plan Note (Signed)
Mupirocin ointment

## 2021-12-06 NOTE — Assessment & Plan Note (Signed)
Dental paste.

## 2021-12-06 NOTE — Assessment & Plan Note (Signed)
Check cmp 

## 2021-12-06 NOTE — Assessment & Plan Note (Signed)
Check UA 

## 2021-12-06 NOTE — Assessment & Plan Note (Signed)
Continue savella

## 2021-12-08 ENCOUNTER — Encounter: Payer: Self-pay | Admitting: Family Medicine

## 2021-12-08 ENCOUNTER — Ambulatory Visit (INDEPENDENT_AMBULATORY_CARE_PROVIDER_SITE_OTHER): Payer: Medicare Other | Admitting: Family Medicine

## 2021-12-08 ENCOUNTER — Other Ambulatory Visit: Payer: Self-pay

## 2021-12-08 VITALS — BP 124/76 | HR 72 | Temp 97.5°F | Resp 16 | Ht 64.0 in | Wt 173.0 lb

## 2021-12-08 DIAGNOSIS — J3089 Other allergic rhinitis: Secondary | ICD-10-CM | POA: Diagnosis not present

## 2021-12-08 DIAGNOSIS — L509 Urticaria, unspecified: Secondary | ICD-10-CM | POA: Diagnosis not present

## 2021-12-08 DIAGNOSIS — J309 Allergic rhinitis, unspecified: Secondary | ICD-10-CM | POA: Insufficient documentation

## 2021-12-08 MED ORDER — METHYLPREDNISOLONE 4 MG PO TBPK: ORAL_TABLET | ORAL | 0 refills | Status: DC

## 2021-12-08 MED ORDER — TRIAMCINOLONE ACETONIDE 40 MG/ML IJ SUSP
80.0000 mg | Freq: Once | INTRAMUSCULAR | Status: AC
  Administered 2021-12-08: 80 mg via INTRAMUSCULAR

## 2021-12-08 NOTE — Assessment & Plan Note (Signed)
Kenalog shot 80 mg IM given.  Medrol dose pack given if does not clear or rebounds.

## 2021-12-08 NOTE — Progress Notes (Signed)
Acute Office Visit  Subjective:    Patient ID: Laura [name], female    DOB: Aug 27, 1966, 56 y.o.   MRN: [id number]  Chief Complaint  Patient presents with   Rash    HPI: Patient is in today for rash and itching.  She reports that her symptoms started 1 week ago with itchy rash left groin with burning discomfort.  The rash has spread to her left side and chest.    Past Medical History:  Diagnosis Date   Acute GI bleeding 09/01/2019   Anxiety and depression    Bowel obstruction ([place])    Chest pain    a. 01/2016 Ex MV: Hypertensive response. Freq PVCs w/ exercise. nl EF. No ST/T changes. No ischemia.   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 03/2016 Echo: EF 60-65%, no rwma, mild MR, nl LA size, nl RV fxn.   High cholesterol    Kidney stones    Nasal septal perforation 05/12/2018   Hx of cocaine use   Palpitations    a. 03/2016 Holter: Sinus rhythm, avg HR 83, max 123, min 64. 4 PACs. 10,356 isolated PVCs, one vent couplet, 3842 V bigeminy, 4 beats NSVT->prev on BB - dc 2/2 swelling.   Prediabetes 12/23/2015   Overview:  Hba1c higher but not diabetic. Took metformin to try to lessen   Raynaud disease    Rectocele    Shingles    Sleep apnea    "mild" per pt   Torn rotator cuff    left   Urinary retention with incomplete bladder emptying    Vaginal dryness, menopausal    Vaginal enterocele    Vitamin D deficiency 12/03/2014   Wears hearing aid in both ears     Past Surgical History:  Procedure Laterality Date   56 HOUR [place] STUDY N/A 10/11/2018   Procedure: [place];  Surgeon: Mauri Pole, MD;  Location: [facility];  Service: Endoscopy;  Laterality: N/A;   ABDOMINAL HYSTERECTOMY     ANKLE SURGERY     ran over by mother in car by [place]  09/02/2019   Procedure: BIOPSY;  Surgeon: Rush Landmark Telford Nab., MD;  Location:  [place];  Service: Gastroenterology;;   COLONOSCOPY     COLONOSCOPY WITH PROPOFOL N/A 09/02/2019   Procedure: COLONOSCOPY WITH PROPOFOL;  Surgeon: Irving Copas., MD;  Location: [place];  Service: Gastroenterology;  Laterality: N/A;   COLPORRHAPHY  2015   posterior and enterocele ligation   CYSTOSCOPY  04/11/2017   Procedure: CYSTOSCOPY;  Surgeon: Defrancesco, Alanda Slim, MD;  Location: [facility];  Service: Gynecology;;   ESOPHAGEAL MANOMETRY N/A 10/11/2018   Procedure: ESOPHAGEAL MANOMETRY (EM);  Surgeon: Mauri Pole, MD;  Location: [facility];  Service: Endoscopy;  Laterality: N/A;   ESOPHAGOGASTRODUODENOSCOPY (EGD) WITH PROPOFOL N/A 09/02/2019   Procedure: ESOPHAGOGASTRODUODENOSCOPY (EGD) WITH PROPOFOL;  Surgeon: Rush Landmark Telford Nab., MD;  Location: [place];  Service: Gastroenterology;  Laterality: N/A;   EXTRACORPOREAL SHOCK WAVE LITHOTRIPSY Left 07/31/2020   Procedure: EXTRACORPOREAL SHOCK WAVE LITHOTRIPSY (ESWL);  Surgeon: Billey Co, MD;  Location: [facility];  Service: Urology;  Laterality: Left;   KNEE ARTHROSCOPY WITH MEDIAL MENISECTOMY Right 02/04/2020   Procedure: KNEE ARTHROSCOPY WITH PARTIAL LATERAL AND MEDIAL MENISECTOMY,  PARTIAL SYNOVECTOMY AND CHONDROPLASTY;  Surgeon: Lovell Sheehan, MD;  Location: [place];  Service: Orthopedics;  Laterality: Right;   LAPAROSCOPIC SALPINGO OOPHERECTOMY Left 04/11/2017   Procedure: LAPAROSCOPIC LEFT SALPINGO OOPHORECTOMY;  Surgeon: Brayton Mars, MD;  Location: [facility];  Service: Gynecology;  Laterality: Left;   LITHOTRIPSY     OOPHORECTOMY     PARTIAL HYSTERECTOMY     [place] IMPEDANCE STUDY N/A 10/11/2018   Procedure: [place] IMPEDANCE STUDY;  Surgeon: Mauri Pole, MD;  Location: [facility];  Service: Endoscopy;  Laterality: N/A;   PVC ABLATION N/A 01/18/2020   Procedure: PVC ABLATION;  Surgeon: Thompson Grayer, MD;  Location: [place] [facility];  Service: Cardiovascular;  Laterality: N/A;    thumb surgery     UPPER GASTROINTESTINAL ENDOSCOPY      Family History  Problem Relation Age of Onset   Stroke Father    Diabetes Father    Breast cancer Mother 53   Diverticulitis Mother    Esophageal cancer Maternal Grandfather    Colon cancer Paternal Grandmother    Suicidality Brother    Ovarian cancer Neg Hx    Stomach cancer Neg Hx     Social History   Socioeconomic History   Marital status: Legally Separated    Spouse name: Not on file   Number of children: 4   Years of education: Not on file   Highest education level: Some college, no degree  Occupational History   Occupation: Disabled  Tobacco Use   Smoking status: Former    Types: Cigarettes    Quit date: 04/08/1995    Years since quitting: 26.6   Smokeless tobacco: Never  Vaping Use   Vaping Use: Never used  Substance and Sexual Activity   Alcohol use: Not Currently    Alcohol/week: 1.0 standard drink    Types: 1 Glasses of wine per week    Comment: rare; once every 6 months   Drug use: No   Sexual activity: Yes    Partners: Male    Birth control/protection: Surgical  Other Topics Concern   Not on file  Social History Narrative   Separated - 1 son and 1 daughter   Disabled    1 caffeine/day   Past smoker   No EtOH, drugs      08/02/2018      Social Determinants of Health   Financial Resource Strain: Low Risk    Difficulty of Paying Living Expenses: Not hard at all  Food Insecurity: No Food Insecurity   Worried About Charity fundraiser in the Last Year: Never true   Ran Out of Food in the Last Year: Never true  Transportation Needs: No Transportation Needs   Lack of Transportation (Medical): No   Lack of Transportation (Non-Medical): No  Physical Activity: Sufficiently Active   Days of Exercise per Week: 6 days   Minutes of Exercise per Session: 60 min  Stress: No Stress Concern Present   Feeling of Stress : Not at all  Social Connections: Not on file  Intimate Partner Violence: Not  At Risk   Fear of Current or Ex-Partner: No   Emotionally Abused: No   Physically Abused: No   Sexually Abused: No    Outpatient Medications Prior to Visit  Medication Sig Dispense Refill   albuterol (VENTOLIN HFA) 108 (90 Base) MCG/ACT inhaler Inhale 2 puffs into the lungs every 4 (four) hours as needed for wheezing or shortness of breath. 18 g 3   ALPRAZolam (XANAX) 1 MG tablet  Take 1 mg by mouth 4 (four) times daily as needed for anxiety.     Blood Glucose Monitoring Suppl (ACCU-CHEK GUIDE) w/Device KIT 1 each by Does not apply route daily. 1 kit 0   EPINEPHrine 0.3 mg/0.3 mL IJ SOAJ injection Inject 0.3 mLs (0.3 mg total) into the muscle as needed for anaphylaxis. 1 each 2   furosemide (LASIX) 20 MG tablet Take 20 mg by mouth 2 (two) times daily.     glucose blood (ACCU-CHEK GUIDE) test strip 1 each by Other route daily in the afternoon. Use as instructed 100 each 12   hydrochlorothiazide (HYDRODIURIL) 25 MG tablet Take 1 tablet (25 mg total) by mouth daily as needed (swelling). 30 tablet 0   hydrOXYzine (VISTARIL) 100 MG capsule Take 1 capsule (100 mg total) by mouth 3 (three) times daily as needed for itching. 30 capsule 0   ipratropium-albuterol (DUONEB) 0.5-2.5 (3) MG/3ML SOLN Take 3 mLs by nebulization every 4 (four) hours as needed. (Patient taking differently: Take 3 mLs by nebulization every 4 (four) hours as needed (asthma).) 360 mL 2   levothyroxine (SYNTHROID) 112 MCG tablet TAKE 1 TABLET BY MOUTH  DAILY 90 tablet 3   Milnacipran HCl (SAVELLA) 12.5 MG TABS Take 12.5 mg by mouth daily.     mupirocin ointment (BACTROBAN) 2 % Apply 1 application topically 2 (two) times daily. 22 g 0   potassium chloride SA (KLOR-CON) 20 MEQ tablet TAKE 1 TABLET BY MOUTH  DAILY 90 tablet 3   quinapril (ACCUPRIL) 20 MG tablet TAKE 1 TABLET BY MOUTH  TWICE DAILY 180 tablet 2   SUMAtriptan (IMITREX) 25 MG tablet Take 1 tablet (25 mg total) by mouth every 2 (two) hours as needed for migraine. May repeat  in 2 hours if headache persists or recurs. 10 tablet 3   SYMBICORT 80-4.5 MCG/ACT inhaler USE 2 INHALATIONS BY MOUTH  TWICE DAILY 30.6 g 3   tiZANidine (ZANAFLEX) 4 MG tablet Take 8 mg by mouth at bedtime.  0   triamcinolone (KENALOG) 0.1 % paste Use as directed 1 application in the mouth or throat 2 (two) times daily. 5 g 12   zolpidem (AMBIEN) 10 MG tablet Take 10 mg by mouth at bedtime.     montelukast (SINGULAIR) 10 MG tablet Take 1 tablet (10 mg total) by mouth at bedtime. (Patient not taking: Reported on 10/29/2021) 30 tablet 3   phentermine 15 MG capsule Take 1 capsule (15 mg total) by mouth every morning. (Patient not taking: Reported on 10/07/2021) 30 capsule 0   Facility-Administered Medications Prior to Visit  Medication Dose Route Frequency Provider Last Rate Last Admin   albuterol (PROVENTIL) (2.5 MG/3ML) 0.083% nebulizer solution 2.5 mg  2.5 mg Nebulization Q4H PRN Cox, Kirsten, MD        Allergies  Allergen Reactions   Meperidine Hives   Shellfish Allergy Shortness Of Breath and Swelling   Diltiazem Swelling   Singulair [Montelukast]     Swelling all over.    Zetia [Ezetimibe]     Swelling of face.    Acebutolol Swelling and Other (See Comments)   Amlodipine     Swelling    Celebrex [Celecoxib] Swelling    Patient began taking for knee pain and started swelling (hands, feet, face).    Codeine Hives and Nausea And Vomiting   Dexlansoprazole Other (See Comments)    Abdominal pain   Dronedarone Swelling and Other (See Comments)   Duloxetine Hcl Other (See Comments)  Made pt feel crazy   Flecainide Swelling   Gabapentin Other (See Comments)    Makes her feel crazy    Hydrocodone Other (See Comments)    Keeps patient awake.   Losartan     Swelling    Metoprolol Swelling   Mexiletine     Swelling - hands, legs, face   Mirtazapine Swelling   Omeprazole Other (See Comments)    Abdominal pain   Oxycodone Itching   Pregabalin Other (See Comments)    twitch    Repatha [Evolocumab]     Malaise    Sectral [Acebutolol Hcl] Swelling   Statins Other (See Comments)    Muscle pain   Toradol [Ketorolac Tromethamine] Itching   Tramadol     Unable to sleep, makes her itch    Review of Systems  Constitutional:  Positive for fatigue. Negative for chills and fever.  HENT:  Negative for congestion.   Respiratory:  Positive for cough. Negative for shortness of breath.   Cardiovascular:  Negative for chest pain.  Gastrointestinal:  Negative for abdominal pain.  Genitourinary:  Negative for dysuria.  Skin:  Positive for rash.       Itching   Neurological:  Positive for headaches.      Objective:    Physical Exam Vitals reviewed.  Constitutional:      Appearance: Normal appearance.  Skin:    Findings: Rash (hives on lower left abdomen and left breast.) present.  Neurological:     Mental Status: She is alert.    BP 124/76    Pulse 72    Temp (!) 97.5 F (36.4 C)    Resp 16    Ht _0  (1.626 m)    Wt 173 lb (78.5 kg)    BMI 29.70 kg/m  Wt Readings from Last 3 Encounters:  12/08/21 173 lb (78.5 kg)  11/24/21 175 lb (79.4 kg)  11/03/21 171 lb 3.2 oz (77.7 kg)    Health Maintenance Due  Topic Date Due   Zoster Vaccines- Shingrix (1 of 2) Never done   MAMMOGRAM  08/07/2020    There are no preventive care reminders to display for this patient.   Lab Results  Component Value Date   TSH 1.890 11/03/2021   Lab Results  Component Value Date   WBC 6.5 11/03/2021   HGB 14.0 11/03/2021   HCT 43.8 11/03/2021   MCV 90 11/03/2021   PLT 329 11/03/2021   Lab Results  Component Value Date   NA 134 11/24/2021   K 4.4 11/24/2021   CO2 29 11/24/2021   GLUCOSE 95 11/24/2021   BUN 21 11/24/2021   CREATININE 0.70 11/24/2021   BILITOT 0.5 11/24/2021   ALKPHOS 62 11/24/2021   AST 21 11/24/2021   ALT 22 11/24/2021   PROT 7.2 11/24/2021   ALBUMIN 4.6 11/24/2021   CALCIUM 10.0 11/24/2021   ANIONGAP 9 10/29/2021   EGFR 101 11/24/2021    Lab Results  Component Value Date   CHOL 288 (H) 08/05/2021   Lab Results  Component Value Date   HDL 69 08/05/2021   Lab Results  Component Value Date   LDLCALC 199 (H) 08/05/2021   Lab Results  Component Value Date   TRIG 117 08/05/2021   Lab Results  Component Value Date   CHOLHDL 4.2 08/05/2021   Lab Results  Component Value Date   HGBA1C 5.8 (H) 08/05/2021       Assessment & Plan:   Problem List Items Addressed This  Visit       Respiratory   Allergic rhinitis due to allergen    Numerous allergies in past. Frequently has allergic rhinitis and also has urticaria.  Previously received allergy testing and allergy shots.  Continue hydroxyzine 100 mg three times a day.        Relevant Orders   Ambulatory referral to Allergy     Musculoskeletal and Integument   Urticaria - Primary    Kenalog shot 80 mg IM given.  Medrol dose pack given if does not clear or rebounds.       Relevant Orders   Ambulatory referral to Allergy   Meds ordered this encounter  Medications   triamcinolone acetonide (KENALOG-40) injection 80 mg   methylPREDNISolone (MEDROL DOSEPAK) 4 MG TBPK tablet    Sig: Take 6 pills day 1, take 5 pills day 2, take 4 pills day 3, take 3 pills day 4, take 2 pills day 5, take 1 pill day 6.    Dispense:  21 each    Refill:  0    Orders Placed This Encounter  Procedures   Ambulatory referral to Allergy     Follow-up: Return if symptoms worsen or fail to improve.  An After Visit Summary was printed and given to the patient.  Rochel Brome, MD [facility] [phone]

## 2021-12-08 NOTE — Assessment & Plan Note (Addendum)
Numerous allergies in past. Frequently has allergic rhinitis and also has urticaria.  Previously received allergy testing and allergy shots.  Continue hydroxyzine 100 mg three times a day.

## 2021-12-09 ENCOUNTER — Ambulatory Visit: Payer: Medicare Other | Admitting: Neurology

## 2021-12-09 ENCOUNTER — Telehealth: Payer: Self-pay | Admitting: Neurology

## 2021-12-09 NOTE — Telephone Encounter (Signed)
Pt cancelled appt due to not feeling good.

## 2021-12-14 ENCOUNTER — Other Ambulatory Visit: Payer: Self-pay | Admitting: Family Medicine

## 2021-12-14 DIAGNOSIS — Z1231 Encounter for screening mammogram for malignant neoplasm of breast: Secondary | ICD-10-CM

## 2021-12-18 ENCOUNTER — Other Ambulatory Visit: Payer: Self-pay | Admitting: Family Medicine

## 2021-12-18 ENCOUNTER — Other Ambulatory Visit: Payer: Self-pay

## 2021-12-18 ENCOUNTER — Encounter: Payer: Self-pay | Admitting: Family Medicine

## 2021-12-18 DIAGNOSIS — R600 Localized edema: Secondary | ICD-10-CM | POA: Diagnosis not present

## 2021-12-18 DIAGNOSIS — Z87442 Personal history of urinary calculi: Secondary | ICD-10-CM | POA: Diagnosis not present

## 2021-12-18 DIAGNOSIS — I10 Essential (primary) hypertension: Secondary | ICD-10-CM | POA: Diagnosis not present

## 2021-12-18 DIAGNOSIS — N261 Atrophy of kidney (terminal): Secondary | ICD-10-CM | POA: Diagnosis not present

## 2021-12-18 MED ORDER — QUINAPRIL HCL 40 MG PO TABS: 40.0000 mg | ORAL_TABLET | Freq: Two times a day (BID) | ORAL | 0 refills | Status: DC

## 2021-12-18 MED ORDER — HYDROCHLOROTHIAZIDE 25 MG PO TABS: 25.0000 mg | ORAL_TABLET | Freq: Every day | ORAL | 0 refills | Status: DC | PRN

## 2021-12-24 ENCOUNTER — Ambulatory Visit: Payer: Medicare Other | Admitting: Neurology

## 2021-12-25 ENCOUNTER — Other Ambulatory Visit: Payer: Self-pay

## 2021-12-25 MED ORDER — VALSARTAN 320 MG PO TABS: 320.0000 mg | ORAL_TABLET | Freq: Every day | ORAL | 2 refills | Status: DC

## 2021-12-30 ENCOUNTER — Other Ambulatory Visit: Payer: Self-pay

## 2021-12-30 ENCOUNTER — Ambulatory Visit
Admission: RE | Admit: 2021-12-30 | Discharge: 2021-12-30 | Disposition: A | Discharge status: Home / Self Care | Payer: Medicare Other | Source: Ambulatory Visit | Attending: Family Medicine | Admitting: Family Medicine

## 2021-12-30 DIAGNOSIS — Z1231 Encounter for screening mammogram for malignant neoplasm of breast: Secondary | ICD-10-CM

## 2022-01-08 ENCOUNTER — Ambulatory Visit: Payer: Medicare Other | Admitting: Physical Medicine and Rehabilitation

## 2022-01-11 ENCOUNTER — Ambulatory Visit (INDEPENDENT_AMBULATORY_CARE_PROVIDER_SITE_OTHER): Payer: Medicare Other | Admitting: Family Medicine

## 2022-01-11 ENCOUNTER — Other Ambulatory Visit: Payer: Self-pay

## 2022-01-11 ENCOUNTER — Encounter: Payer: Self-pay | Admitting: Family Medicine

## 2022-01-11 VITALS — BP 122/80 | HR 64 | Temp 97.7°F | Ht 64.0 in | Wt 176.0 lb

## 2022-01-11 DIAGNOSIS — M797 Fibromyalgia: Secondary | ICD-10-CM

## 2022-01-11 DIAGNOSIS — I10 Essential (primary) hypertension: Secondary | ICD-10-CM

## 2022-01-11 DIAGNOSIS — R635 Abnormal weight gain: Secondary | ICD-10-CM

## 2022-01-11 DIAGNOSIS — E782 Mixed hyperlipidemia: Secondary | ICD-10-CM

## 2022-01-11 DIAGNOSIS — E039 Hypothyroidism, unspecified: Secondary | ICD-10-CM | POA: Diagnosis not present

## 2022-01-11 DIAGNOSIS — G4730 Sleep apnea, unspecified: Secondary | ICD-10-CM

## 2022-01-11 DIAGNOSIS — R7303 Prediabetes: Secondary | ICD-10-CM | POA: Diagnosis not present

## 2022-01-11 DIAGNOSIS — J4520 Mild intermittent asthma, uncomplicated: Secondary | ICD-10-CM

## 2022-01-11 DIAGNOSIS — G9332 Myalgic encephalomyelitis/chronic fatigue syndrome: Secondary | ICD-10-CM | POA: Diagnosis not present

## 2022-01-11 DIAGNOSIS — J4540 Moderate persistent asthma, uncomplicated: Secondary | ICD-10-CM

## 2022-01-11 DIAGNOSIS — Z7251 High risk heterosexual behavior: Secondary | ICD-10-CM

## 2022-01-11 MED ORDER — AMITRIPTYLINE HCL 10 MG PO TABS: 10.0000 mg | ORAL_TABLET | Freq: Every day | ORAL | 0 refills | Status: DC

## 2022-01-11 NOTE — Progress Notes (Unsigned)
Subjective:  Patient ID: Laura [name], female    DOB: 27-Aug-1966  Age: 56 y.o. MRN: [id number]  Chief Complaint  Patient presents with   Hypertension   Hyperlipidemia   HPI: Hypertension: Patient is currently taking Valsartan 320 mg take 1 tablet daily. BP is good and patient is tolerating medicine.  Hyperlipidemia: Patient is intolerant to statins, zetia,and repatha. She tried repatha again and did not tolerate. Caused malaise. Intolerant to zetia because it caused swelling and itching. Eats a mediterranean diet.   Moderate persistent Asthma: Patient has been holding Symbicort 80/4.5 take 2 puffs twice daily. She does not feel shortness of breath or wheezy.    Fibromyalgia: Patient has had worsening fibromyalgia over the weekend. Patient intolerant to gabapentin, lyrica, cymbalta, savella, nsaids. She is taking tizanidine 8 mg before bed. She takes ambient 10 mg before bed. Arthritis panel is normal.   She is concerned about her weight gain of 5 lbs in the last months. She denies changes to diet or exercise.    Current Outpatient Medications on File Prior to Visit  Medication Sig Dispense Refill   albuterol (VENTOLIN HFA) 108 (90 Base) MCG/ACT inhaler Inhale 2 puffs into the lungs every 4 (four) hours as needed for wheezing or shortness of breath. 18 g 3   ALPRAZolam (XANAX) 1 MG tablet Take 1 mg by mouth 4 (four) times daily as needed for anxiety.     Blood Glucose Monitoring Suppl (ACCU-CHEK GUIDE) w/Device KIT 1 each by Does not apply route daily. 1 kit 0   EPINEPHrine 0.3 mg/0.3 mL IJ SOAJ injection Inject 0.3 mLs (0.3 mg total) into the muscle as needed for anaphylaxis. 1 each 2   furosemide (LASIX) 20 MG tablet Take 20 mg by mouth 2 (two) times daily.     glucose blood (ACCU-CHEK GUIDE) test strip 1 each by Other route daily in the afternoon. Use as instructed 100 each 12   hydrochlorothiazide (HYDRODIURIL) 25 MG tablet Take 1 tablet (25 mg total) by mouth daily as needed  (swelling). 90 tablet 0   hydrOXYzine (VISTARIL) 100 MG capsule Take 1 capsule (100 mg total) by mouth 3 (three) times daily as needed for itching. 30 capsule 0   ipratropium-albuterol (DUONEB) 0.5-2.5 (3) MG/3ML SOLN Take 3 mLs by nebulization every 4 (four) hours as needed. (Patient taking differently: Take 3 mLs by nebulization every 4 (four) hours as needed (asthma).) 360 mL 2   levothyroxine (SYNTHROID) 112 MCG tablet TAKE 1 TABLET BY MOUTH  DAILY 90 tablet 3   mupirocin ointment (BACTROBAN) 2 % Apply 1 application topically 2 (two) times daily. 22 g 0   potassium chloride SA (KLOR-CON) 20 MEQ tablet TAKE 1 TABLET BY MOUTH  DAILY 90 tablet 3   SUMAtriptan (IMITREX) 25 MG tablet Take 1 tablet (25 mg total) by mouth every 2 (two) hours as needed for migraine. May repeat in 2 hours if headache persists or recurs. 10 tablet 3   SYMBICORT 80-4.5 MCG/ACT inhaler USE 2 INHALATIONS BY MOUTH  TWICE DAILY 30.6 g 3   tiZANidine (ZANAFLEX) 4 MG tablet Take 8 mg by mouth at bedtime.  0   triamcinolone (KENALOG) 0.1 % paste Use as directed 1 application in the mouth or throat 2 (two) times daily. 5 g 12   valsartan (DIOVAN) 320 MG tablet Take 1 tablet (320 mg total) by mouth daily. 30 tablet 2   zolpidem (AMBIEN) 10 MG tablet Take 10 mg by mouth at bedtime.  No current facility-administered medications on file prior to visit.   Past Medical History:  Diagnosis Date   Acute GI bleeding 09/01/2019   Anxiety and depression    Bowel obstruction ([place])    Chest pain    a. 01/2016 Ex MV: Hypertensive response. Freq PVCs w/ exercise. nl EF. No ST/T changes. No ischemia.   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 03/2016 Echo: EF 60-65%, no rwma, mild MR, nl LA size, nl RV fxn.   High cholesterol    Kidney stones    Nasal septal perforation 05/12/2018   Hx of cocaine use   Palpitations    a. 03/2016  Holter: Sinus rhythm, avg HR 83, max 123, min 64. 4 PACs. 10,356 isolated PVCs, one vent couplet, 3842 V bigeminy, 4 beats NSVT->prev on BB - dc 2/2 swelling.   Prediabetes 12/23/2015   Overview:  Hba1c higher but not diabetic. Took metformin to try to lessen   Raynaud disease    Rectocele    Shingles    Sleep apnea    "mild" per pt   Torn rotator cuff    left   Urinary retention with incomplete bladder emptying    Vaginal dryness, menopausal    Vaginal enterocele    Vitamin D deficiency 12/03/2014   Wears hearing aid in both ears    Past Surgical History:  Procedure Laterality Date   30 HOUR [place] STUDY N/A 10/11/2018   Procedure: [place];  Surgeon: Mauri Pole, MD;  Location: [facility];  Service: Endoscopy;  Laterality: N/A;   ABDOMINAL HYSTERECTOMY     ANKLE SURGERY     ran over by mother in car by [place]  09/02/2019   Procedure: BIOPSY;  Surgeon: Rush Landmark Telford Nab., MD;  Location: [facility];  Service: Gastroenterology;;   COLONOSCOPY     COLONOSCOPY WITH PROPOFOL N/A 09/02/2019   Procedure: COLONOSCOPY WITH PROPOFOL;  Surgeon: Irving Copas., MD;  Location: [facility];  Service: Gastroenterology;  Laterality: N/A;   COLPORRHAPHY  2015   posterior and enterocele ligation   CYSTOSCOPY  04/11/2017   Procedure: CYSTOSCOPY;  Surgeon: Defrancesco, Alanda Slim, MD;  Location: [facility];  Service: Gynecology;;   ESOPHAGEAL MANOMETRY N/A 10/11/2018   Procedure: ESOPHAGEAL MANOMETRY (EM);  Surgeon: Mauri Pole, MD;  [facility];  Service: Endoscopy;  Laterality: N/A;   ESOPHAGOGASTRODUODENOSCOPY (EGD) WITH PROPOFOL N/A 09/02/2019   Procedure: ESOPHAGOGASTRODUODENOSCOPY (EGD) WITH PROPOFOL;  Surgeon: Rush Landmark Telford Nab., MD;  Location: [place];  Service: Gastroenterology;  Laterality: N/A;   EXTRACORPOREAL SHOCK WAVE LITHOTRIPSY Left 07/31/2020   Procedure: EXTRACORPOREAL SHOCK WAVE LITHOTRIPSY (ESWL);   Surgeon: Billey Co, MD;  Location: [facility];  Service: Urology;  Laterality: Left;   KNEE ARTHROSCOPY WITH MEDIAL MENISECTOMY Right 02/04/2020   Procedure: KNEE ARTHROSCOPY WITH PARTIAL LATERAL AND MEDIAL MENISECTOMY,  PARTIAL SYNOVECTOMY AND CHONDROPLASTY;  Surgeon: Lovell Sheehan, MD;  Location: [place] [facility];  Service: Orthopedics;  Laterality: Right;   LAPAROSCOPIC SALPINGO OOPHERECTOMY Left 04/11/2017   Procedure: LAPAROSCOPIC LEFT SALPINGO OOPHORECTOMY;  Surgeon: Brayton Mars, MD;  Location: [facility];  Service: Gynecology;  Laterality: Left;   LITHOTRIPSY     OOPHORECTOMY     PARTIAL HYSTERECTOMY     PH IMPEDANCE STUDY N/A  10/11/2018   Procedure: [place] IMPEDANCE STUDY;  Surgeon: Mauri Pole, MD;  [facility];  Service: Endoscopy;  Laterality: N/A;   PVC ABLATION N/A 01/18/2020   Procedure: PVC ABLATION;  Surgeon: Thompson Grayer, MD;  Location: [place] [facility];  Service: Cardiovascular;  Laterality: N/A;   thumb surgery     UPPER GASTROINTESTINAL ENDOSCOPY      Family History  Problem Relation Age of Onset   Stroke Father    Diabetes Father    Breast cancer Mother 71   Diverticulitis Mother    Esophageal cancer Maternal Grandfather    Colon cancer Paternal Grandmother    Suicidality Brother    Ovarian cancer Neg Hx    Stomach cancer Neg Hx    Social History   Socioeconomic History   Marital status: Single    Spouse name: Not on file   Number of children: 4   Years of education: Not on file   Highest education level: Some college, no degree  Occupational History   Occupation: Disabled  Tobacco Use   Smoking status: Former    Types: Cigarettes    Quit date: 04/08/1995    Years since quitting: 26.7   Smokeless tobacco: Never  Vaping Use   Vaping Use: Never used  Substance and Sexual Activity   Alcohol use: Not Currently    Alcohol/week: 1.0 standard drink    Types: 1 Glasses of wine per week    Comment: rare; once every 6 months    Drug use: No   Sexual activity: Yes    Partners: Male    Birth control/protection: Surgical  Other Topics Concern   Not on file  Social History Narrative   Separated - 1 son and 1 daughter   Disabled    1 caffeine/day   Past smoker   No EtOH, drugs      08/02/2018      Social Determinants of Health   Financial Resource Strain: Low Risk    Difficulty of Paying Living Expenses: Not hard at all  Food Insecurity: No Food Insecurity   Worried About Charity fundraiser in the Last Year: Never true   Ran Out of Food in the Last Year: Never true  Transportation Needs: No Transportation Needs   Lack of Transportation (Medical): No   Lack of Transportation (Non-Medical): No  Physical Activity: Sufficiently Active   Days of Exercise per Week: 6 days   Minutes of Exercise per Session: 60 min  Stress: No Stress Concern Present   Feeling of Stress : Not at all  Social Connections: Not on file    Review of Systems  Constitutional:  Positive for fatigue. Negative for appetite change and fever.  HENT:  Negative for congestion, ear pain, sinus pressure and sore throat.   Respiratory:  Negative for cough, chest tightness, shortness of breath and wheezing.   Cardiovascular:  Negative for chest pain and palpitations.  Gastrointestinal:  Negative for abdominal pain, constipation, diarrhea, nausea and vomiting.  Genitourinary:  Negative for dysuria and hematuria.  Musculoskeletal:  Positive for myalgias. Negative for arthralgias, back pain and joint swelling.  Skin:  Negative for rash.  Neurological:  Negative for dizziness, weakness and headaches.  Psychiatric/Behavioral:  Negative for dysphoric mood. The patient is not nervous/anxious.     Objective:  BP 122/80 (BP Location: Left Arm, Patient Position: Sitting)    Pulse 64    Temp 97.7 F (36.5 C) (Temporal)    Ht _0  (1.626  m)    Wt 176 lb (79.8 kg)    SpO2 98%    BMI 30.21 kg/m   BP/Weight 01/11/2022 12/08/2021 04/28/3150  Systolic BP  761 607 371  Diastolic BP 80 76 72  Wt. (Lbs) 176 173 175  BMI 30.21 29.7 30.04    Physical Exam Vitals reviewed.  Constitutional:      Appearance: Normal appearance. She is normal weight.  Neck:     Vascular: No carotid bruit.  Cardiovascular:     Rate and Rhythm: Normal rate and regular rhythm.     Heart sounds: Normal heart sounds.  Pulmonary:     Effort: Pulmonary effort is normal. No respiratory distress.     Breath sounds: Normal breath sounds.  Abdominal:     General: Abdomen is flat. Bowel sounds are normal.     Palpations: Abdomen is soft.     Tenderness: There is no abdominal tenderness.  Musculoskeletal:        General: Tenderness (FM trigger points diffusely.) present.  Neurological:     Mental Status: She is alert and oriented to person, place, and time.  Psychiatric:        Mood and Affect: Mood normal.        Behavior: Behavior normal.    Diabetic Foot Exam - Simple   No data filed      Lab Results  Component Value Date   WBC 6.5 11/03/2021   HGB 14.0 11/03/2021   HCT 43.8 11/03/2021   PLT 329 11/03/2021   GLUCOSE 95 11/24/2021   CHOL 288 (H) 08/05/2021   TRIG 117 08/05/2021   HDL 69 08/05/2021   LDLDIRECT 222 (H) 04/26/2019   LDLCALC 199 (H) 08/05/2021   ALT 22 11/24/2021   AST 21 11/24/2021   NA 134 11/24/2021   K 4.4 11/24/2021   CL 93 (L) 11/24/2021   CREATININE 0.70 11/24/2021   BUN 21 11/24/2021   CO2 29 11/24/2021   TSH 1.890 11/03/2021   INR 0.9 08/11/2017   HGBA1C 5.8 (H) 08/05/2021      Assessment & Plan:   Problem List Items Addressed This Visit       Cardiovascular and Mediastinum   Essential hypertension - Primary   Relevant Orders   CBC With Diff/Platelet   Comprehensive metabolic panel   TSH     Respiratory   Moderate persistent asthma   Sleep apnea     Other   Fibromyalgia (Chronic)   Relevant Medications   amitriptyline (ELAVIL) 10 MG tablet   Mixed hyperlipidemia   Relevant Orders   Lipid panel    Prediabetes   Relevant Orders   Hemoglobin A1c   Other Visit Diagnoses     Chronic fatigue syndrome with fibromyalgia       Relevant Medications   amitriptyline (ELAVIL) 10 MG tablet   Other Relevant Orders   Ambulatory referral to Pain Clinic     .  Meds ordered this encounter  Medications   amitriptyline (ELAVIL) 10 MG tablet    Sig: Take 1 tablet (10 mg total) by mouth at bedtime.    Dispense:  30 tablet    Refill:  0    Orders Placed This Encounter  Procedures   CBC With Diff/Platelet   Comprehensive metabolic panel   Lipid panel   Hemoglobin A1c   TSH   Ambulatory referral to Pain Clinic     Follow-up: Return in about 3 months (around 04/13/2022) for chronic fasting.  An After Visit  Summary was printed and given to the patient.    I,Lauren M Auman,acting as a scribe for Rochel Brome, MD.,have documented all relevant documentation on the behalf of Rochel Brome, MD,as directed by  Rochel Brome, MD while in the presence of Rochel Brome, MD.    Rochel Brome, MD [facility] [phone]

## 2022-01-11 NOTE — Patient Instructions (Addendum)
Change tizanidine 4 mg twice daily  ?Start amitriptyline 10 mg before bed.  ?Change ambien to 10 mg 1/2 pill before bed as needed, and wait 30-60 minutes after taking amitriptyline.  ? ?Referral: ?[place] [facility] ?[facility] ?[address] [place] ?[id number] ?[place] [address] ?Dept: [phone] ?Dr. Sima Matas ?

## 2022-01-11 NOTE — Assessment & Plan Note (Signed)
Recommend continue to work on eating healthy diet and exercise.  

## 2022-01-11 NOTE — Assessment & Plan Note (Signed)
Doing well off symbicort.

## 2022-01-11 NOTE — Assessment & Plan Note (Signed)
Check tsh.  ?Discussed diet and exercise  ?

## 2022-01-12 ENCOUNTER — Encounter: Payer: Self-pay | Admitting: Family Medicine

## 2022-01-12 LAB — CBC WITH DIFF/PLATELET
Basophils Absolute: 0 10*3/uL (ref 0.0–0.2)
Basos: 1 %
EOS (ABSOLUTE): 0.1 10*3/uL (ref 0.0–0.4)
Eos: 2 %
Hematocrit: 41.6 % (ref 34.0–46.6)
Hemoglobin: 13.5 g/dL (ref 11.1–15.9)
Immature Grans (Abs): 0 10*3/uL (ref 0.0–0.1)
Immature Granulocytes: 0 %
Lymphocytes Absolute: 1.9 10*3/uL (ref 0.7–3.1)
Lymphs: 38 %
MCH: 29 pg (ref 26.6–33.0)
MCHC: 32.5 g/dL (ref 31.5–35.7)
MCV: 90 fL (ref 79–97)
Monocytes Absolute: 0.4 10*3/uL (ref 0.1–0.9)
Monocytes: 8 %
Neutrophils Absolute: 2.5 10*3/uL (ref 1.4–7.0)
Neutrophils: 51 %
Platelets: 311 10*3/uL (ref 150–450)
RBC: 4.65 x10E6/uL (ref 3.77–5.28)
RDW: 13.1 % (ref 11.7–15.4)
WBC: 5 10*3/uL (ref 3.4–10.8)

## 2022-01-12 LAB — COMPREHENSIVE METABOLIC PANEL
ALT: 13 IU/L (ref 0–32)
AST: 15 IU/L (ref 0–40)
Albumin/Globulin Ratio: 2.3 — ABNORMAL HIGH (ref 1.2–2.2)
Albumin: 4.2 g/dL (ref 3.8–4.9)
Alkaline Phosphatase: 57 IU/L (ref 44–121)
BUN/Creatinine Ratio: 20 (ref 9–23)
BUN: 15 mg/dL (ref 6–24)
Bilirubin Total: 0.3 mg/dL (ref 0.0–1.2)
CO2: 25 mmol/L (ref 20–29)
Calcium: 9.5 mg/dL (ref 8.7–10.2)
Chloride: 104 mmol/L (ref 96–106)
Creatinine, Ser: 0.74 mg/dL (ref 0.57–1.00)
Globulin, Total: 1.8 g/dL (ref 1.5–4.5)
Glucose: 97 mg/dL (ref 70–99)
Potassium: 4.8 mmol/L (ref 3.5–5.2)
Sodium: 140 mmol/L (ref 134–144)
Total Protein: 6 g/dL (ref 6.0–8.5)
eGFR: 95 mL/min/{1.73_m2} (ref >59)

## 2022-01-12 LAB — LIPID PANEL
Chol/HDL Ratio: 3.6 ratio (ref 0.0–4.4)
Cholesterol, Total: 279 mg/dL — ABNORMAL HIGH (ref 100–199)
HDL: 77 mg/dL (ref >39)
LDL Chol Calc (NIH): 183 mg/dL — ABNORMAL HIGH (ref 0–99)
Triglycerides: 108 mg/dL (ref 0–149)
VLDL Cholesterol Cal: 19 mg/dL (ref 5–40)

## 2022-01-12 LAB — TSH: TSH: 1.84 u[IU]/mL (ref 0.450–4.500)

## 2022-01-12 LAB — HEMOGLOBIN A1C
Est. average glucose Bld gHb Est-mCnc: 123 mg/dL
Hgb A1c MFr Bld: 5.9 % — ABNORMAL HIGH (ref 4.8–5.6)

## 2022-01-12 NOTE — Progress Notes (Signed)
Blood count normal.  ?Liver function normal.  ?Kidney function normal.  ?Thyroid function normal.  ?Cholesterol: LDL very high. Recommend zetia 10 mg daily. I do not believe she has tried this.  ?HBA1C: 5.9. stable.  ?

## 2022-01-14 ENCOUNTER — Other Ambulatory Visit: Payer: Self-pay | Admitting: Family Medicine

## 2022-01-14 MED ORDER — FLUCONAZOLE 150 MG PO TABS: 150.0000 mg | ORAL_TABLET | Freq: Every day | ORAL | 0 refills | Status: DC

## 2022-01-20 ENCOUNTER — Encounter: Payer: Self-pay | Admitting: Family Medicine

## 2022-01-25 ENCOUNTER — Telehealth: Payer: Self-pay

## 2022-01-25 NOTE — Progress Notes (Signed)
? ? ?Chronic Care Management ?Pharmacy Assistant  ? ?Name: Laura [name]  MRN: [id number] DOB: [birth year] ? ? ?Reason for Encounter: General Adherence Call  ?  ?Recent office visits:  ?01/14/22 Rochel Brome MD. Orders Only. Started on Diflucan 118m.  ? ?01/11/22 CRochel BromeMD. Seen for HTN and HLD. Referral to pain clinic. Started on Amitriptyline HCI 159mdaily at bedtime. Started on Albuterol Sulfate 108 inhaler. Change tizanidine 4 mg twice daily. Change ambien to 10 mg 1/2 pill before bed as needed, and wait 30-60 minutes after taking amitriptyline.  ? ?12/08/21 CoRochel BromeD. Seen for Urticaria. Referral to Allergy. Started on Medro Dosepak 1m47mD/C Montelukast Sodium 79m67md Phentermine HCI 15mg23mministered Triamcinolone Acetonide 80mg.78m?11/24/21 Cox, KRochel Bromeeen for Asthma and HTN. Started on Mupirocin 2% and Triamcinolone Acetonide 0.1%. D/C Ezetimibe 79mg d66m and Pregabalin 25mg an65mlacyclovir HCI 500mg. In86mse lisinopril to 40 mg twice a day.  ? ?11/03/21 Cox, KirsRochel Brome for Cough and SOB. Started on Cefdinir 300mg 2 ti79mdaily. Start hydrochlorothiazide 25 mg once daily.  ? ?10/08/21 Cox, KirstRochel Bromes Only. Started on Lagevrio 200mg for 532ms. ? ?10/07/21 Cox, KirsteRochel Bromeor routine visit. Started on Ezetimibe 79mg daily,41mtelukast Sodium 79mg daily a71mdtime and Valacyclovir HCI 500mg 3 times 26my. ? ?09/02/21 Cox, Kirsten MRochel BromeFibromyalgia. Started on Pregabalin 25mg 3 times d62m.  ? ?08/19/21 [place], ShannonJerrell Belfastinusitis. Started on Augmentin 875mg 2 times da38m Started on Symbicort Inhaler 80-4.5mcg and Duoneb 61m-2.5mg nebulizer eve75m4hrs prn. Started on Prednisone 20mg for 10 days a60mromethazine DM 6.25-15mg 4 times daily prn. Started on Fluconazole 150mg daily. ? ?10/795mCox, Kirsten MD. OrdRochel Bromearted on Fluconazole 150mg daily for 3 day69m? ?08/05/21 Cox, Kirsten MD Seen Rochel Bromevisit. Started on Azithromycin 250mg and  Phentermine85m 15mg. D/C Adderall 79m25memaglutide 0.5mg38mlacyclovir 1000mg69md Potassium Chlori40m ? ?Recent consult visits:  ?12/03/21 (Nephrology) Singh, Harmeet MD. Seen foMurlean I[place] D/C med as follows Repatha 140mg, Ozempic 0.5mg, Cloni57m 0.1mg and Ti11midine 1mg.  ? ?58m7/22 (Neurology) 51mara, Amadou MD. Seen for MiAlric Rand on Medrol Dosepak 1mg and Sumatriptan Succinate 36mg.  ? ?Hospital visits:  ?Me65mtion Reconciliation was completed by comparing discharge summary, patient?s EMR and Pharmacy list, and upon discussion with patient. ? ?Admitted to the hospital on 10/29/21 due to Fibromyalgia. Discharge date was 10/29/21. [facility].   [facility]??  ? ?Medications: ?Outpatient Encounter Medications as of 01/25/2022  ?Medication Sig  ? albuterol (VENTOLIN HFA) 108 (90 Base) MCG/ACT inhaler Inhale 2 puffs into the lungs every 4 (four) hours as needed for wheezing or shortness of breath.  ? ALPRAZolam (XANAX) 1 MG tablet Take 1 mg by mouth 4 (four) times daily as needed for anxiety.  ? amitriptyline (ELAVIL) 10 MG tablet Take 1 tablet (10 mg total) by mouth at bedtime.  ? Blood Glucose Monitoring Suppl (ACCU-CHEK GUIDE) w/Device KIT 1 each by Does not apply route daily.  ? EPINEPHrine 0.3 mg/0.3 mL IJ SOAJ injection Inject 0.3 mLs (0.3 mg total) into the muscle as needed for anaphylaxis.  ? fluconazole (DIFLUCAN) 150 MG tablet Take 1 tablet (150 mg total) by mouth daily.  ? furosemide (LASIX) 20 MG tablet Take 20 mg by mouth 2 (two) times daily.  ? glucose blood (ACCU-CHEK GUIDE) test strip 1 each by Other route daily in  the afternoon. Use as instructed  ? hydrochlorothiazide (HYDRODIURIL) 25 MG tablet Take 1 tablet (25 mg total) by mouth daily as needed (swelling).  ? hydrOXYzine (VISTARIL) 100 MG capsule Take 1 capsule (100 mg total) by mouth 3 (three) times daily as needed for itching.  ? ipratropium-albuterol  (DUONEB) 0.5-2.5 (3) MG/3ML SOLN Take 3 mLs by nebulization every 4 (four) hours as needed. (Patient taking differently: Take 3 mLs by nebulization every 4 (four) hours as needed (asthma).)  ? levothyroxine (SYNTHROID) 112 MCG tablet TAKE 1 TABLET BY MOUTH  DAILY  ? mupirocin ointment (BACTROBAN) 2 % Apply 1 application topically 2 (two) times daily.  ? potassium chloride SA (KLOR-CON) 20 MEQ tablet TAKE 1 TABLET BY MOUTH  DAILY  ? SUMAtriptan (IMITREX) 25 MG tablet Take 1 tablet (25 mg total) by mouth every 2 (two) hours as needed for migraine. May repeat in 2 hours if headache persists or recurs.  ? SYMBICORT 80-4.5 MCG/ACT inhaler USE 2 INHALATIONS BY MOUTH  TWICE DAILY  ? tiZANidine (ZANAFLEX) 4 MG tablet Take 8 mg by mouth at bedtime.  ? triamcinolone (KENALOG) 0.1 % paste Use as directed 1 application in the mouth or throat 2 (two) times daily.  ? valsartan (DIOVAN) 320 MG tablet Take 1 tablet (320 mg total) by mouth daily.  ? zolpidem (AMBIEN) 10 MG tablet Take 10 mg by mouth at bedtime.  ? ?No facility-administered encounter medications on file as of 01/25/2022.  ? ? ?[place] for general disease state and medication adherence call.  ? ?Patient is > 5 days past due for refill on the following medications per chart history: ? ?[facility] Medications: ?Medication Name/mg Last Fill Days Supply ?Valsartan 332m  12/25/21 30ds ? ?I Messaged Dr. CTobie Poetteam to complete a PA for her Valsartan. Pt has not been able to get this through pharmacy until they complete this. Pt stated she will be out tomorrow.  ? ?What concerns do you have about your medications? Pt stopped her Amitriptyline to help with sleep but it did the opposite and kept her up all night and made her Zombie. She stopped it two days ago and is starting to feel herself again. ? ?The patient reports the following side effects with her medications. Pt stated she has been swelling more in her legs since taking the Valsartan but is doing ok. She  stated she will be monitoring the swelling  ? ?How often do you forget or accidentally miss a dose? Never ? ?Do you use a pillbox? Yes ? ?Are you having any problems getting your medications from your pharmacy? Yes ? ?Has the cost of your medications been a concern? No ? ?Since last visit with CPP, no interventions have been made:  ? ?The patient has not had an ED visit since last contact.  ? ?The patient reports the following problems with their health.  ? ?she reports the following  concerns or questions for NArizona Constableat this time.  ? ? ?Patient states BP re as follows 01/25/22 134/86 ? ? ?Care Gaps: ?Last annual wellness visit?None noted  ?Mammogram: 12/30/21 ?Colonoscopy: 09/02/19 ? ? ?DElray Mcgregor CMA ?Clinical Pharmacist Assistant  ?[phone] ?

## 2022-01-26 ENCOUNTER — Encounter: Payer: Self-pay | Admitting: Family Medicine

## 2022-01-28 NOTE — Telephone Encounter (Signed)
Dc'd amitripyline per patient's wishes. Let PCP know about elevated BP readings ?

## 2022-01-29 NOTE — Telephone Encounter (Signed)
Patient stated she stop amitripyline because it kept her up at night and also she didn't have any emotions while taking medication. ?

## 2022-01-30 ENCOUNTER — Other Ambulatory Visit: Payer: Self-pay | Admitting: Family Medicine

## 2022-01-30 MED ORDER — PRALUENT 75 MG/ML ~~LOC~~ SOAJ
75.0000 mg | SUBCUTANEOUS | 0 refills | Status: DC
Start: 2022-01-30 — End: 2022-05-25

## 2022-02-02 ENCOUNTER — Telehealth: Payer: Self-pay

## 2022-02-02 NOTE — Telephone Encounter (Signed)
Repatha PAP done last year, most likely didn't go through. Patient would like to try Praluent. Tried calling to see why Repatha denied and household income, unable to get in touch ?

## 2022-02-02 NOTE — Telephone Encounter (Signed)
Spoke with patient, she tried Repatha but had ADR's with it (And lipids). Would like to try Praluent, will coordinate with CCM team ?

## 2022-02-03 ENCOUNTER — Telehealth: Payer: Self-pay

## 2022-02-03 NOTE — Progress Notes (Signed)
? ? ?  Chronic Care Management ?Pharmacy Assistant  ? ?Name: Laura [name]  MRN: [id number] DOB: 02-24-1966 ? ? ?Reason for Encounter: Patient Assistance Documentation  ? ?02/03/22- I filled out a PAP for Praluent through [facility] and has been uploaded to be mailed out to pt to complete.Pt has been informed.  ? ?Medications: ?Outpatient Encounter Medications as of 02/03/2022  ?Medication Sig  ? albuterol (VENTOLIN HFA) 108 (90 Base) MCG/ACT inhaler Inhale 2 puffs into the lungs every 4 (four) hours as needed for wheezing or shortness of breath.  ? Alirocumab (PRALUENT) 75 MG/ML SOAJ Inject 75 mg into the skin every 14 (fourteen) days.  ? ALPRAZolam (XANAX) 1 MG tablet Take 1 mg by mouth 4 (four) times daily as needed for anxiety.  ? amitriptyline (ELAVIL) 10 MG tablet Take 1 tablet (10 mg total) by mouth at bedtime. (Patient not taking: Reported on 01/28/2022)  ? Blood Glucose Monitoring Suppl (ACCU-CHEK GUIDE) w/Device KIT 1 each by Does not apply route daily.  ? EPINEPHrine 0.3 mg/0.3 mL IJ SOAJ injection Inject 0.3 mLs (0.3 mg total) into the muscle as needed for anaphylaxis.  ? fluconazole (DIFLUCAN) 150 MG tablet Take 1 tablet (150 mg total) by mouth daily.  ? furosemide (LASIX) 20 MG tablet Take 20 mg by mouth 2 (two) times daily.  ? glucose blood (ACCU-CHEK GUIDE) test strip 1 each by Other route daily in the afternoon. Use as instructed  ? hydrochlorothiazide (HYDRODIURIL) 25 MG tablet Take 1 tablet (25 mg total) by mouth daily as needed (swelling).  ? hydrOXYzine (VISTARIL) 100 MG capsule Take 1 capsule (100 mg total) by mouth 3 (three) times daily as needed for itching.  ? ipratropium-albuterol (DUONEB) 0.5-2.5 (3) MG/3ML SOLN Take 3 mLs by nebulization every 4 (four) hours as needed. (Patient taking differently: Take 3 mLs by nebulization every 4 (four) hours as needed (asthma).)  ? levothyroxine (SYNTHROID) 112 MCG tablet TAKE 1 TABLET BY MOUTH  DAILY  ? mupirocin ointment (BACTROBAN) 2 %  Apply 1 application topically 2 (two) times daily.  ? potassium chloride SA (KLOR-CON) 20 MEQ tablet TAKE 1 TABLET BY MOUTH  DAILY  ? SUMAtriptan (IMITREX) 25 MG tablet Take 1 tablet (25 mg total) by mouth every 2 (two) hours as needed for migraine. May repeat in 2 hours if headache persists or recurs.  ? SYMBICORT 80-4.5 MCG/ACT inhaler USE 2 INHALATIONS BY MOUTH  TWICE DAILY  ? tiZANidine (ZANAFLEX) 4 MG tablet Take 8 mg by mouth at bedtime.  ? triamcinolone (KENALOG) 0.1 % paste Use as directed 1 application in the mouth or throat 2 (two) times daily.  ? valsartan (DIOVAN) 320 MG tablet Take 1 tablet (320 mg total) by mouth daily.  ? zolpidem (AMBIEN) 10 MG tablet Take 10 mg by mouth at bedtime.  ? ?No facility-administered encounter medications on file as of 02/03/2022.  ? ? ?Elray Mcgregor, CMA ?Clinical Pharmacist Assistant  ?[phone]  ?

## 2022-02-04 ENCOUNTER — Encounter: Payer: Self-pay | Admitting: Family Medicine

## 2022-02-04 ENCOUNTER — Other Ambulatory Visit: Payer: Self-pay

## 2022-02-04 ENCOUNTER — Other Ambulatory Visit: Payer: Self-pay | Admitting: Family Medicine

## 2022-02-04 MED ORDER — HYDROCHLOROTHIAZIDE 25 MG PO TABS: 25.0000 mg | ORAL_TABLET | Freq: Every day | ORAL | 0 refills | Status: DC

## 2022-02-04 MED ORDER — HYDROCHLOROTHIAZIDE 25 MG PO TABS: 25.0000 mg | ORAL_TABLET | Freq: Every day | ORAL | 0 refills | Status: DC | PRN

## 2022-02-07 NOTE — Assessment & Plan Note (Signed)
Well controlled.  ?No changes to medicines.  ?Continue to work on eating a healthy diet and exercise.  ?Labs drawn today.  ?

## 2022-02-07 NOTE — Assessment & Plan Note (Signed)
Change tizanidine 4 mg twice daily  ?Start amitriptyline 10 mg before bed.  ?Change ambien to 10 mg 1/2 pill before bed as needed, and wait 30-60 minutes after taking amitriptyline.  ? ?Referral: ?[facility] ?[facility] ?[address] [place], [address] [place] ?[id number] ?[place] [address] ?Dept: [phone] ?Dr. Sima Matas ?

## 2022-02-07 NOTE — Assessment & Plan Note (Signed)
Continue cpap.  ?Managed by Pulmonary  ?

## 2022-02-07 NOTE — Assessment & Plan Note (Addendum)
Check tsh. The current medical regimen is effective;  continue present plan and medications. ?Continue synthroid 112 mcg once daily.  ?

## 2022-02-07 NOTE — Assessment & Plan Note (Signed)
Recommend continue to work on eating healthy diet and exercise. ?No medicines recommended at this time.  ?Check lipid panel ?

## 2022-02-10 ENCOUNTER — Ambulatory Visit: Payer: Medicare Other | Admitting: Allergy and Immunology

## 2022-02-19 ENCOUNTER — Ambulatory Visit: Payer: Medicare Other | Admitting: Physical Medicine and Rehabilitation

## 2022-02-24 ENCOUNTER — Ambulatory Visit (INDEPENDENT_AMBULATORY_CARE_PROVIDER_SITE_OTHER): Payer: Medicare Other | Admitting: Legal Medicine

## 2022-02-24 ENCOUNTER — Encounter: Payer: Self-pay | Admitting: Legal Medicine

## 2022-02-24 VITALS — BP 100/70 | HR 90 | Temp 98.3°F | Resp 15 | Ht 64.0 in | Wt 178.0 lb

## 2022-02-24 DIAGNOSIS — J029 Acute pharyngitis, unspecified: Secondary | ICD-10-CM

## 2022-02-24 DIAGNOSIS — J4541 Moderate persistent asthma with (acute) exacerbation: Secondary | ICD-10-CM | POA: Diagnosis not present

## 2022-02-24 LAB — POCT RAPID STREP A (OFFICE): Rapid Strep A Screen: NEGATIVE

## 2022-02-24 MED ORDER — FLUCONAZOLE 150 MG PO TABS: 150.0000 mg | ORAL_TABLET | Freq: Once | ORAL | 0 refills | Status: AC

## 2022-02-24 MED ORDER — AMOXICILLIN-POT CLAVULANATE 875-125 MG PO TABS
1.0000 | ORAL_TABLET | Freq: Two times a day (BID) | ORAL | 0 refills | Status: DC
Start: 2022-02-24 — End: 2022-04-19

## 2022-02-24 MED ORDER — PREDNISOLONE 5 MG (48) PO TBPK: ORAL_TABLET | ORAL | 0 refills | Status: DC

## 2022-02-24 MED ORDER — METHYLPREDNISOLONE 4 MG PO TBPK: ORAL_TABLET | ORAL | 0 refills | Status: DC

## 2022-02-24 MED ORDER — HYDROCOD POLI-CHLORPHE POLI ER 10-8 MG/5ML PO SUER: 5.0000 mL | Freq: Two times a day (BID) | ORAL | 0 refills | Status: DC | PRN
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
## 2022-02-25 ENCOUNTER — Encounter: Payer: Self-pay | Admitting: Family Medicine

## 2022-02-25 NOTE — Telephone Encounter (Signed)
Tried calling to check in on throat, went to VM ?

## 2022-03-02 ENCOUNTER — Telehealth: Payer: Self-pay

## 2022-03-02 NOTE — Telephone Encounter (Signed)
Follow-up appointment scheduled for tomorrow morning.  Patient to go to the ED or Urgent Care tonight if symptoms worsen.   ?

## 2022-03-02 NOTE — Telephone Encounter (Signed)
Patient called stating that since last night when she goes to stand up she gets very dizzy. When lying flat feels like the room is spinning "a little", when walking she feels unsteady, has not fallen, when she coughs she feels like she is going to "black out". Was seen last week by Dr. Henrene Pastor and was tx'd with augmentin and predinisolone. Current bp 111/68 p108, O298%. Also states she suppose to return to work tomorrow but is concerned due to her having to climb a ladder. ?

## 2022-03-03 ENCOUNTER — Encounter: Payer: Self-pay | Admitting: Legal Medicine

## 2022-03-03 ENCOUNTER — Ambulatory Visit (INDEPENDENT_AMBULATORY_CARE_PROVIDER_SITE_OTHER): Payer: Medicare Other | Admitting: Legal Medicine

## 2022-03-03 VITALS — BP 90/60 | HR 73 | Temp 97.5°F | Resp 15 | Ht 64.0 in | Wt 177.0 lb

## 2022-03-03 DIAGNOSIS — R42 Dizziness and giddiness: Secondary | ICD-10-CM | POA: Diagnosis not present

## 2022-03-03 MED ORDER — ONDANSETRON HCL 4 MG PO TABS: 4.0000 mg | ORAL_TABLET | Freq: Three times a day (TID) | ORAL | 0 refills | Status: DC | PRN

## 2022-03-03 MED ORDER — MECLIZINE HCL 25 MG PO TABS: 25.0000 mg | ORAL_TABLET | Freq: Three times a day (TID) | ORAL | 0 refills | Status: DC | PRN
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
## 2022-03-10 ENCOUNTER — Other Ambulatory Visit: Payer: Self-pay | Admitting: Family Medicine

## 2022-03-10 NOTE — Telephone Encounter (Signed)
Refill sent to pharmacy.   

## 2022-04-14 ENCOUNTER — Telehealth: Payer: Self-pay

## 2022-04-14 NOTE — Progress Notes (Signed)
Chronic Care Management Pharmacy Assistant   Name: Laura [name]  MRN: [id number] DOB: 17-Nov-1965   Reason for Encounter: Disease State call for Lipids    Recent office visits:  03/03/22 Reinaldo Meeker MD. Seen for Vertigo. Started on Meclizine 25mg  and Ondansetron 4mg .  02/24/22 Reinaldo Meeker MD. Seen for Pharyngitis. Started on Augmentin, Tussionex, Medrol DosePak, Prednisone 5mg  and Fluconazole 150mg . D/C Amitriptyline 10mg , Symbicort Inhaler and Gabapentin 200mg .   02/04/22 Cox, Kirsten MD. Orders Only. Ordered HCTZ 25mg .  01/30/22 Rochel Brome MD. Orders Only. Started on Praluent 75mg .  Recent consult visits:  None  Hospital visits:  None  Medications: Outpatient Encounter Medications as of 04/14/2022  Medication Sig   albuterol (VENTOLIN HFA) 108 (90 Base) MCG/ACT inhaler Inhale 2 puffs into the lungs every 4 (four) hours as needed for wheezing or shortness of breath.   Alirocumab (PRALUENT) 75 MG/ML SOAJ Inject 75 mg into the skin every 14 (fourteen) days.   ALPRAZolam (XANAX) 1 MG tablet Take 1 mg by mouth 4 (four) times daily as needed for anxiety.   amoxicillin-clavulanate (AUGMENTIN) 875-125 MG tablet Take 1 tablet by mouth 2 (two) times daily.   Blood Glucose Monitoring Suppl (ACCU-CHEK GUIDE) w/Device KIT 1 each by Does not apply route daily.   EPINEPHrine 0.3 mg/0.3 mL IJ SOAJ injection Inject 0.3 mLs (0.3 mg total) into the muscle as needed for anaphylaxis.   furosemide (LASIX) 20 MG tablet Take 20 mg by mouth 2 (two) times daily.   glucose blood (ACCU-CHEK GUIDE) test strip 1 each by Other route daily in the afternoon. Use as instructed   hydrochlorothiazide (HYDRODIURIL) 25 MG tablet TAKE 1 TABLET(25 MG) BY MOUTH DAILY AS NEEDED FOR SWELLING   hydrOXYzine (VISTARIL) 100 MG capsule Take 1 capsule (100 mg total) by mouth 3 (three) times daily as needed for itching.   ipratropium-albuterol (DUONEB) 0.5-2.5 (3) MG/3ML SOLN Take 3 mLs by nebulization every 4 (four)  hours as needed. (Patient taking differently: Take 3 mLs by nebulization every 4 (four) hours as needed (asthma).)   levothyroxine (SYNTHROID) 112 MCG tablet TAKE 1 TABLET BY MOUTH  DAILY   meclizine (ANTIVERT) 25 MG tablet Take 1 tablet (25 mg total) by mouth 3 (three) times daily as needed for dizziness.   methylPREDNISolone (MEDROL DOSEPAK) 4 MG TBPK tablet Take 6ills first day , then 5 pills day 2 and then cut down one pill day until gone   mupirocin ointment (BACTROBAN) 2 % Apply 1 application topically 2 (two) times daily.   ondansetron (ZOFRAN) 4 MG tablet Take 1 tablet (4 mg total) by mouth every 8 (eight) hours as needed for nausea or vomiting.   potassium chloride SA (KLOR-CON) 20 MEQ tablet TAKE 1 TABLET BY MOUTH  DAILY   prednisoLONE 5 MG (48) TBPK 48Take 6ills first day , then 6 pills day 2 and then cut down one pill 2 days until gone   SUMAtriptan (IMITREX) 25 MG tablet Take 1 tablet (25 mg total) by mouth every 2 (two) hours as needed for migraine. May repeat in 2 hours if headache persists or recurs.   tiZANidine (ZANAFLEX) 4 MG tablet Take 8 mg by mouth at bedtime.   valsartan (DIOVAN) 320 MG tablet Take 1 tablet (320 mg total) by mouth daily.   zolpidem (AMBIEN) 10 MG tablet Take 10 mg by mouth at bedtime.   No facility-administered encounter medications on file as of 04/14/2022.    Lipid Panel    Component Value Date/Time  CHOL 279 (H) 01/11/2022 0901   TRIG 108 01/11/2022 0901   HDL 77 01/11/2022 0901   LDLCALC 183 (H) 01/11/2022 0901   LDLCALC 196 (H) 06/20/2019 0908   LDLDIRECT 222 (H) 04/26/2019 1338    10-year ASCVD risk score: The 10-year ASCVD risk score (Arnett DK, et al., 2019) is: 2.9%   Values used to calculate the score:     Age: 56 years     Sex: Female     Is Non-Hispanic African American: No     Diabetic: Yes     Tobacco smoker: No     Systolic Blood Pressure: 90 mmHg     Is BP treated: Yes     HDL Cholesterol: 77 mg/dL     Total Cholesterol: 279  mg/dL  Current antihyperlipidemic regimen:  Praluent 35m Inject every 14 days- Pt has not started yet. Waiting on PAP. I gave pt the number to call and check on her application   ASCVD risk enhancing conditions: HTN  What recent interventions/DTPs have been made by any provider to improve Cholesterol control since last CPP Visit: Pt is supposed to start Praulent but is waiting on PAP.  Any recent hospitalizations or ED visits since last visit with CPP? No  What diet changes have been made to improve Cholesterol?  No changes  What exercise is being done to improve Cholesterol?  Pt does what she can but not a lot   Adherence Review: Does the patient have >5 day gap between last estimated fill dates? No  Star Rating Drugs Medication Name Last Fill Days supply None noted   Care Gaps: Last annual wellness visit?None noted   DElray Mcgregor CChanhassenPharmacist Assistant  [phone]

## 2022-04-19 ENCOUNTER — Ambulatory Visit (INDEPENDENT_AMBULATORY_CARE_PROVIDER_SITE_OTHER): Payer: Medicare Other | Admitting: Family Medicine

## 2022-04-19 ENCOUNTER — Telehealth: Payer: Self-pay

## 2022-04-19 ENCOUNTER — Encounter: Payer: Self-pay | Admitting: Family Medicine

## 2022-04-19 ENCOUNTER — Other Ambulatory Visit: Payer: Self-pay | Admitting: Family Medicine

## 2022-04-19 ENCOUNTER — Telehealth: Payer: Self-pay | Admitting: Family Medicine

## 2022-04-19 VITALS — BP 120/74 | HR 72 | Temp 97.4°F | Resp 16 | Ht 64.0 in | Wt 176.0 lb

## 2022-04-19 DIAGNOSIS — E782 Mixed hyperlipidemia: Secondary | ICD-10-CM

## 2022-04-19 DIAGNOSIS — E538 Deficiency of other specified B group vitamins: Secondary | ICD-10-CM | POA: Diagnosis not present

## 2022-04-19 DIAGNOSIS — G4733 Obstructive sleep apnea (adult) (pediatric): Secondary | ICD-10-CM | POA: Diagnosis not present

## 2022-04-19 DIAGNOSIS — M797 Fibromyalgia: Secondary | ICD-10-CM | POA: Diagnosis not present

## 2022-04-19 DIAGNOSIS — I10 Essential (primary) hypertension: Secondary | ICD-10-CM | POA: Diagnosis not present

## 2022-04-19 DIAGNOSIS — R300 Dysuria: Secondary | ICD-10-CM

## 2022-04-19 DIAGNOSIS — R7303 Prediabetes: Secondary | ICD-10-CM

## 2022-04-19 DIAGNOSIS — F331 Major depressive disorder, recurrent, moderate: Secondary | ICD-10-CM

## 2022-04-19 DIAGNOSIS — E039 Hypothyroidism, unspecified: Secondary | ICD-10-CM

## 2022-04-19 DIAGNOSIS — Z9989 Dependence on other enabling machines and devices: Secondary | ICD-10-CM | POA: Diagnosis not present

## 2022-04-19 LAB — POCT URINALYSIS DIPSTICK
Bilirubin, UA: NEGATIVE
Blood, UA: NEGATIVE
Glucose, UA: NEGATIVE
Ketones, UA: NEGATIVE
Leukocytes, UA: NEGATIVE
Nitrite, UA: NEGATIVE
Protein, UA: NEGATIVE
Spec Grav, UA: 1.025 (ref 1.010–1.025)
Urobilinogen, UA: NEGATIVE E.U./dL — AB
pH, UA: 6 (ref 5.0–8.0)

## 2022-04-19 MED ORDER — QUINAPRIL HCL 40 MG PO TABS: 40.0000 mg | ORAL_TABLET | Freq: Two times a day (BID) | ORAL | 1 refills | Status: DC

## 2022-04-19 MED ORDER — LISINOPRIL 40 MG PO TABS: 40.0000 mg | ORAL_TABLET | Freq: Two times a day (BID) | ORAL | 0 refills | Status: DC

## 2022-04-19 MED ORDER — MOMETASONE FUROATE 0.1 % EX CREA: 1.0000 | TOPICAL_CREAM | Freq: Every day | CUTANEOUS | 2 refills | Status: DC

## 2022-04-19 NOTE — Telephone Encounter (Signed)
Patient said Quinapril is not longer in the market. She asked if can you send something else to the pharmacy. Please advice.

## 2022-04-19 NOTE — Progress Notes (Signed)
Subjective:  Patient ID: Laura [name], female    DOB: 09/14/1966  Age: 56 y.o. MRN: [id number]  Chief Complaint  Patient presents with   Hypertension   Hyperlipidemia   HPI: Hypertension: Patient is currently taking Valsartan 320 mg take 1 tablet daily. BP is good, but patient feels malaise, fatigue, swelling. Patient tolerated quinapril 40 mg twice daily.  Rash: mometasone cream helps with itching. Helps rash that comes on sporadically. Also will always have sore in mouth (triamcinalone paste.) Happens every 6-8 weeks and lasts for about one week. Has an appt with allergist for testing.   Hyperlipidemia: Patient is intolerant to statins, zetia,and repatha. Unable to get on praluent.  Waiting on patient assistance for Praluent.  Not eating well.   Moderate persistent Asthma: Patient has been holding Symbicort 80/4.5 take 2 puffs  once daily.  Patient has not had increased wheezing.  She has had to use her DuoNeb treatment recently.  Fibromyalgia: Patient has had worsening fibromyalgia over the weekend. Patient intolerant to gabapentin, lyrica, cymbalta, savella, nsaids. She is taking tizanidine 4 mg three times a day. She takes ambien 10 mg before bed. Arthritis panel is normal.    Depression: Sees Chucky May, MD. patient has tried on multiple medicines in the past and frequently has side effects or allergic reactions.  OSA on CPAP.  Current Outpatient Medications on File Prior to Visit  Medication Sig Dispense Refill   albuterol (VENTOLIN HFA) 108 (90 Base) MCG/ACT inhaler Inhale 2 puffs into the lungs every 4 (four) hours as needed for wheezing or shortness of breath. 18 g 3   ALPRAZolam (XANAX) 1 MG tablet Take 1 mg by mouth 4 (four) times daily as needed for anxiety.     Blood Glucose Monitoring Suppl (ACCU-CHEK GUIDE) w/Device KIT 1 each by Does not apply route daily. 1 kit 0   EPINEPHrine 0.3 mg/0.3 mL IJ SOAJ injection Inject 0.3 mLs (0.3 mg total) into the muscle as  needed for anaphylaxis. 1 each 2   furosemide (LASIX) 20 MG tablet Take 20 mg by mouth 2 (two) times daily.     glucose blood (ACCU-CHEK GUIDE) test strip 1 each by Other route daily in the afternoon. Use as instructed 100 each 12   hydrOXYzine (VISTARIL) 100 MG capsule Take 1 capsule (100 mg total) by mouth 3 (three) times daily as needed for itching. 30 capsule 0   ipratropium-albuterol (DUONEB) 0.5-2.5 (3) MG/3ML SOLN Take 3 mLs by nebulization every 4 (four) hours as needed. (Patient taking differently: Take 3 mLs by nebulization every 4 (four) hours as needed (asthma).) 360 mL 2   levothyroxine (SYNTHROID) 112 MCG tablet TAKE 1 TABLET BY MOUTH  DAILY 90 tablet 3   mupirocin ointment (BACTROBAN) 2 % Apply 1 application topically 2 (two) times daily. 22 g 0   potassium chloride SA (KLOR-CON) 20 MEQ tablet TAKE 1 TABLET BY MOUTH  DAILY 90 tablet 3   SUMAtriptan (IMITREX) 25 MG tablet Take 1 tablet (25 mg total) by mouth every 2 (two) hours as needed for migraine. May repeat in 2 hours if headache persists or recurs. 10 tablet 3   SYMBICORT 80-4.5 MCG/ACT inhaler USE 2 INHALATIONS BY MOUTH  TWICE DAILY 30.6 g 3   tiZANidine (ZANAFLEX) 4 MG tablet Take 8 mg by mouth at bedtime.  0   triamcinolone (KENALOG) 0.1 % paste Use as directed 1 application in the mouth or throat 2 (two) times daily. 5 g 12  valsartan (DIOVAN) 320 MG tablet Take 1 tablet (320 mg total) by mouth daily. 30 tablet 2   zolpidem (AMBIEN) 10 MG tablet Take 10 mg by mouth at bedtime.     No current facility-administered medications on file prior to visit.   Past Medical History:  Diagnosis Date   Acute GI bleeding 09/01/2019   Anxiety and depression    Bowel obstruction ([place])    Chest pain    a. 01/2016 Ex MV: Hypertensive response. Freq PVCs w/ exercise. nl EF. No ST/T changes. No ischemia.   Complex ovarian cyst, left 03/08/2017   COVID    COVID-19 10/2019   Cystocele    Exposure to hepatitis C    Fibromyalgia     Heart murmur    a. 03/2016 Echo: EF 60-65%, no rwma, mild MR, nl LA size, nl RV fxn.   High cholesterol    Kidney stones    Nasal septal perforation 05/12/2018   Hx of cocaine use   Palpitations    a. 03/2016 Holter: Sinus rhythm, avg HR 83, max 123, min 64. 4 PACs. 10,356 isolated PVCs, one vent couplet, 3842 V bigeminy, 4 beats NSVT->prev on BB - dc 2/2 swelling.   Prediabetes 12/23/2015   Overview:  Hba1c higher but not diabetic. Took metformin to try to lessen   Raynaud disease    Rectocele    Shingles    Sleep apnea    "mild" per pt   Torn rotator cuff    left   Urinary retention with incomplete bladder emptying    Vaginal dryness, menopausal    Vaginal enterocele    Vitamin D deficiency 12/03/2014   Wears hearing aid in both ears    Past Surgical History:  Procedure Laterality Date   31 HOUR [place] STUDY N/A 10/11/2018   Procedure: [place];  Surgeon: Mauri Pole, MD;  [facility];  Service: Endoscopy;  Laterality: N/A;   ABDOMINAL HYSTERECTOMY     ANKLE SURGERY     ran over by mother in car by [place]  09/02/2019   Procedure: BIOPSY;  Surgeon: Rush Landmark Telford Nab., MD;  Location: [place];  Service: Gastroenterology;;   COLONOSCOPY     COLONOSCOPY WITH PROPOFOL N/A 09/02/2019   Procedure: COLONOSCOPY WITH PROPOFOL;  Surgeon: Irving Copas., MD;  Location: [place];  Service: Gastroenterology;  Laterality: N/A;   COLPORRHAPHY  2015   posterior and enterocele ligation   CYSTOSCOPY  04/11/2017   Procedure: CYSTOSCOPY;  Surgeon: Defrancesco, Alanda Slim, MD;  Location: [facility];  Service: Gynecology;;   ESOPHAGEAL MANOMETRY N/A 10/11/2018   Procedure: ESOPHAGEAL MANOMETRY (EM);  Surgeon: Mauri Pole, MD;  [facility];  Service: Endoscopy;  Laterality: N/A;   ESOPHAGOGASTRODUODENOSCOPY (EGD) WITH PROPOFOL N/A 09/02/2019   Procedure: ESOPHAGOGASTRODUODENOSCOPY (EGD) WITH PROPOFOL;  Surgeon:  Rush Landmark Telford Nab., MD;  Location: [place];  Service: Gastroenterology;  Laterality: N/A;   EXTRACORPOREAL SHOCK WAVE LITHOTRIPSY Left 07/31/2020   Procedure: EXTRACORPOREAL SHOCK WAVE LITHOTRIPSY (ESWL);  Surgeon: Billey Co, MD;  Location: [facility];  Service: Urology;  Laterality: Left;   KNEE ARTHROSCOPY WITH MEDIAL MENISECTOMY Right 02/04/2020   Procedure: KNEE ARTHROSCOPY WITH PARTIAL LATERAL AND MEDIAL MENISECTOMY,  PARTIAL SYNOVECTOMY AND CHONDROPLASTY;  Surgeon: Lovell Sheehan, MD;  Location: [place];  Service: Orthopedics;  Laterality: Right;   LAPAROSCOPIC SALPINGO OOPHERECTOMY Left 04/11/2017   Procedure: LAPAROSCOPIC LEFT SALPINGO OOPHORECTOMY;  Surgeon: Defrancesco,  Alanda Slim, MD;  Location: [facility];  Service: Gynecology;  Laterality: Left;   LITHOTRIPSY     OOPHORECTOMY     PARTIAL HYSTERECTOMY     [place] IMPEDANCE STUDY N/A 10/11/2018   Procedure: [place] IMPEDANCE STUDY;  Surgeon: Mauri Pole, MD;  [facility];  Service: Endoscopy;  Laterality: N/A;   PVC ABLATION N/A 01/18/2020   Procedure: PVC ABLATION;  Surgeon: Thompson Grayer, MD;  Location: [facility];  Service: Cardiovascular;  Laterality: N/A;   thumb surgery     UPPER GASTROINTESTINAL ENDOSCOPY      Family History  Problem Relation Age of Onset   Stroke Father    Diabetes Father    Breast cancer Mother 26   Diverticulitis Mother    Esophageal cancer Maternal Grandfather    Colon cancer Paternal Grandmother    Suicidality Brother    Ovarian cancer Neg Hx    Stomach cancer Neg Hx    Social History   Socioeconomic History   Marital status: Single    Spouse name: Not on file   Number of children: 4   Years of education: Not on file   Highest education level: Some college, no degree  Occupational History   Occupation: Disabled  Tobacco Use   Smoking status: Former    Types: Cigarettes    Quit date: 04/08/1995    Years since quitting: 26.8   Smokeless tobacco: Never   Vaping Use   Vaping Use: Never used  Substance and Sexual Activity   Alcohol use: Not Currently    Alcohol/week: 1.0 standard drink    Types: 1 Glasses of wine per week    Comment: rare; once every 6 months   Drug use: No   Sexual activity: Yes    Partners: Male    Birth control/protection: Surgical  Other Topics Concern   Not on file  Social History Narrative   Separated - 1 son and 1 daughter   Disabled    1 caffeine/day   Past smoker   No EtOH, drugs      08/02/2018      Social Determinants of Health   Financial Resource Strain: Low Risk    Difficulty of Paying Living Expenses: Not hard at all  Food Insecurity: No Food Insecurity   Worried About Charity fundraiser in the Last Year: Never true   Ran Out of Food in the Last Year: Never true  Transportation Needs: No Transportation Needs   Lack of Transportation (Medical): No   Lack of Transportation (Non-Medical): No  Physical Activity: Sufficiently Active   Days of Exercise per Week: 6 days   Minutes of Exercise per Session: 60 min  Stress: No Stress Concern Present   Feeling of Stress : Not at all  Social Connections: Not on file    Review of Systems  Constitutional:  Positive for fatigue. Negative for appetite change and fever.  HENT:  Negative for congestion, ear pain, sinus pressure and sore throat.   Respiratory:  Negative for cough, chest tightness, shortness of breath and wheezing.   Cardiovascular:  Negative for chest pain and palpitations.  Gastrointestinal:  Negative for abdominal pain, constipation, diarrhea, nausea and vomiting.  Genitourinary:  Negative for dysuria and hematuria.  Musculoskeletal:  Positive for myalgias. Negative for arthralgias, back pain and joint swelling.  Skin:  Negative for rash.  Neurological:  Negative for dizziness, weakness and headaches.  Psychiatric/Behavioral:  Negative for dysphoric mood. The patient is not nervous/anxious.  Objective:  BP 122/80 (BP Location:  Left Arm, Patient Position: Sitting)   Pulse 64   Temp 97.7 F (36.5 C) (Temporal)   Ht 5' 4"  (1.626 m)   Wt 176 lb (79.8 kg)   SpO2 98%   BMI 30.21 kg/m      01/11/2022    8:38 AM 12/08/2021   11:39 AM 11/24/2021   11:09 AM  BP/Weight  Systolic BP 300 762 263  Diastolic BP 80 76 72  Wt. (Lbs) 176 173 175  BMI 30.21 kg/m2 29.7 kg/m2 30.04 kg/m2    Physical Exam Vitals reviewed.  Constitutional:      Appearance: Normal appearance. She is normal weight.  Neck:     Vascular: No carotid bruit.  Cardiovascular:     Rate and Rhythm: Normal rate and regular rhythm.     Heart sounds: Normal heart sounds.  Pulmonary:     Effort: Pulmonary effort is normal. No respiratory distress.     Breath sounds: Normal breath sounds.  Abdominal:     General: Abdomen is flat. Bowel sounds are normal.     Palpations: Abdomen is soft.     Tenderness: There is no abdominal tenderness.  Musculoskeletal:        General: Tenderness (FM trigger points diffusely.) present.  Neurological:     Mental Status: She is alert and oriented to person, place, and time.  Psychiatric:        Mood and Affect: Mood normal.        Behavior: Behavior normal.    Diabetic Foot Exam - Simple   No data filed      Lab Results  Component Value Date   WBC 5.0 01/11/2022   HGB 13.5 01/11/2022   HCT 41.6 01/11/2022   PLT 311 01/11/2022   GLUCOSE 97 01/11/2022   CHOL 279 (H) 01/11/2022   TRIG 108 01/11/2022   HDL 77 01/11/2022   LDLDIRECT 222 (H) 04/26/2019   LDLCALC 183 (H) 01/11/2022   ALT 13 01/11/2022   AST 15 01/11/2022   NA 140 01/11/2022   K 4.8 01/11/2022   CL 104 01/11/2022   CREATININE 0.74 01/11/2022   BUN 15 01/11/2022   CO2 25 01/11/2022   TSH 1.840 01/11/2022   INR 0.9 08/11/2017   HGBA1C 5.9 (H) 01/11/2022      Assessment & Plan:   Problem List Items Addressed This Visit       Cardiovascular and Mediastinum   Essential hypertension - Primary    Well controlled.  No changes to  medicines.  Continue to work on eating a healthy diet and exercise.  Labs drawn today.        Relevant Orders   CBC With Diff/Platelet (Completed)   Comprehensive metabolic panel (Completed)   TSH (Completed)     Respiratory   Mild intermittent asthma    Doing well off symbicort. Restart if allergies flare asthma.       Sleep apnea    Continue cpap.  Managed by Pulmonary         Endocrine   Hypothyroidism, acquired    Check tsh. The current medical regimen is effective;  continue present plan and medications. Continue synthroid 112 mcg once daily.         Other   Fibromyalgia (Chronic)    Change tizanidine 4 mg twice daily  Start amitriptyline 10 mg before bed.  Change ambien to 10 mg 1/2 pill before bed as needed, and wait 30-60 minutes after taking  amitriptyline.   Referral: [facility] [place], [place] [id number] [place] [id number] Dept: [phone] Dr. Sima Matas      Relevant Medications   amitriptyline (ELAVIL) 10 MG tablet   Mixed hyperlipidemia    Recommend continue to work on eating healthy diet and exercise. No medicines recommended at this time.  Check lipid panel      Relevant Orders   Lipid panel (Completed)   Prediabetes    Recommend continue to work on eating healthy diet and exercise.       Relevant Orders   Hemoglobin A1c (Completed)   Unexplained weight gain    Check tsh.  Discussed diet and exercise       Other Visit Diagnoses     Chronic fatigue syndrome with fibromyalgia       Relevant Medications   amitriptyline (ELAVIL) 10 MG tablet   Other Relevant Orders   Ambulatory referral to Pain Clinic     .  Meds ordered this encounter  Medications   amitriptyline (ELAVIL) 10 MG tablet    Sig: Take 1 tablet (10 mg total) by mouth at bedtime.    Dispense:  30 tablet    Refill:  0    Orders Placed This  Encounter  Procedures   CBC With Diff/Platelet   Comprehensive metabolic panel   Lipid panel   Hemoglobin A1c   TSH   Ambulatory referral to Pain Clinic     Follow-up: Return in about 3 months (around 04/13/2022) for chronic fasting.  An After Visit Summary was printed and given to the patient.  I,Lauren M Auman,acting as a scribe for Rochel Brome, MD.,have documented all relevant documentation on the behalf of Rochel Brome, MD,as directed by  Rochel Brome, MD while in the presence of Rochel Brome, MD.    Rochel Brome, MD [facility] [phone] Current Outpatient Medications on File Prior to Visit  Medication Sig Dispense Refill   albuterol (VENTOLIN HFA) 108 (90 Base) MCG/ACT inhaler Inhale 2 puffs into the lungs every 4 (four) hours as needed for wheezing or shortness of breath. 18 g 3   ALPRAZolam (XANAX) 1 MG tablet Take 1 mg by mouth 4 (four) times daily as needed for anxiety.     Blood Glucose Monitoring Suppl (ACCU-CHEK GUIDE) w/Device KIT 1 each by Does not apply route daily. 1 kit 0   EPINEPHrine 0.3 mg/0.3 mL IJ SOAJ injection Inject 0.3 mLs (0.3 mg total) into the muscle as needed for anaphylaxis. 1 each 2   furosemide (LASIX) 20 MG tablet Take 20 mg by mouth 2 (two) times daily.     hydrochlorothiazide (HYDRODIURIL) 25 MG tablet TAKE 1 TABLET(25 MG) BY MOUTH DAILY AS NEEDED FOR SWELLING 30 tablet 0   hydrOXYzine (VISTARIL) 100 MG capsule Take 1 capsule (100 mg total) by mouth 3 (three) times daily as needed for itching. 30 capsule 0   ipratropium-albuterol (DUONEB) 0.5-2.5 (3) MG/3ML SOLN Take 3 mLs by nebulization every 4 (four) hours as needed. (Patient taking differently: Take 3 mLs by nebulization every 4 (four) hours as needed (asthma).) 360 mL 2   levothyroxine (SYNTHROID) 112 MCG tablet TAKE 1 TABLET BY MOUTH  DAILY 90 tablet 3   mupirocin ointment (BACTROBAN) 2 % Apply 1 application topically 2 (two) times daily. 22 g 0   potassium chloride SA (KLOR-CON) 20  MEQ tablet TAKE 1 TABLET BY MOUTH  DAILY 90 tablet  3   SUMAtriptan (IMITREX) 25 MG tablet Take 1 tablet (25 mg total) by mouth every 2 (two) hours as needed for migraine. May repeat in 2 hours if headache persists or recurs. 10 tablet 3   tiZANidine (ZANAFLEX) 4 MG tablet Take 8 mg by mouth at bedtime.  0   zolpidem (AMBIEN) 10 MG tablet Take 10 mg by mouth at bedtime.     Alirocumab (PRALUENT) 75 MG/ML SOAJ Inject 75 mg into the skin every 14 (fourteen) days. (Patient not taking: Reported on 04/19/2022) 6 mL 0   glucose blood (ACCU-CHEK GUIDE) test strip 1 each by Other route daily in the afternoon. Use as instructed (Patient not taking: Reported on 04/19/2022) 100 each 12   meclizine (ANTIVERT) 25 MG tablet Take 1 tablet (25 mg total) by mouth 3 (three) times daily as needed for dizziness. (Patient not taking: Reported on 04/19/2022) 30 tablet 0   ondansetron (ZOFRAN) 4 MG tablet Take 1 tablet (4 mg total) by mouth every 8 (eight) hours as needed for nausea or vomiting. (Patient not taking: Reported on 04/19/2022) 20 tablet 0   No current facility-administered medications on file prior to visit.   Past Medical History:  Diagnosis Date   Acute GI bleeding 09/01/2019   Anxiety and depression    Bowel obstruction ([place])    Chest pain    a. 01/2016 Ex MV: Hypertensive response. Freq PVCs w/ exercise. nl EF. No ST/T changes. No ischemia.   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 03/2016 Echo: EF 60-65%, no rwma, mild MR, nl LA size, nl RV fxn.   High cholesterol    Kidney stones    Nasal septal perforation 05/12/2018   Hx of cocaine use   Palpitations    a. 03/2016 Holter: Sinus rhythm, avg HR 83, max 123, min 64. 4 PACs. 10,356 isolated PVCs, one vent couplet, 3842 V bigeminy, 4 beats NSVT->prev on BB - dc 2/2 swelling.   Prediabetes 12/23/2015   Overview:  Hba1c higher but not diabetic. Took  metformin to try to lessen   Raynaud disease    Rectocele    Shingles    Sleep apnea    "mild" per pt   Torn rotator cuff    left   Urinary retention with incomplete bladder emptying    Vaginal dryness, menopausal    Vaginal enterocele    Vitamin D deficiency 12/03/2014   Wears hearing aid in both ears    Past Surgical History:  Procedure Laterality Date   18 HOUR [place] STUDY N/A 10/11/2018   Procedure: [place];  Surgeon: Mauri Pole, MD;  [facility];  Service: Endoscopy;  Laterality: N/A;   ABDOMINAL HYSTERECTOMY     ANKLE SURGERY     ran over by mother in car by [place]  09/02/2019   Procedure: BIOPSY;  Surgeon: Rush Landmark Telford Nab., MD;  Location: [facility];  Service: Gastroenterology;;   COLONOSCOPY     COLONOSCOPY WITH PROPOFOL N/A 09/02/2019   Procedure: COLONOSCOPY WITH PROPOFOL;  Surgeon: Irving Copas., MD;  Location: [facility];  Service: Gastroenterology;  Laterality: N/A;   COLPORRHAPHY  2015   posterior and enterocele ligation   CYSTOSCOPY  04/11/2017   Procedure: CYSTOSCOPY;  Surgeon: Defrancesco, Alanda Slim, MD;  Location: [facility];  Service: Gynecology;;   ESOPHAGEAL MANOMETRY N/A 10/11/2018   Procedure: ESOPHAGEAL MANOMETRY (EM);  Surgeon: Mauri Pole, MD;  [facility];  Service: Endoscopy;  Laterality: N/A;   ESOPHAGOGASTRODUODENOSCOPY (EGD) WITH PROPOFOL N/A 09/02/2019   Procedure: ESOPHAGOGASTRODUODENOSCOPY (EGD) WITH PROPOFOL;  Surgeon: Rush Landmark Telford Nab., MD;  Location: [facility];  Service: Gastroenterology;  Laterality: N/A;   EXTRACORPOREAL SHOCK WAVE LITHOTRIPSY Left 07/31/2020   Procedure: EXTRACORPOREAL SHOCK WAVE LITHOTRIPSY (ESWL);  Surgeon: Billey Co, MD;  Location: [facility];  Service: Urology;  Laterality: Left;   KNEE ARTHROSCOPY WITH MEDIAL MENISECTOMY Right 02/04/2020   Procedure: KNEE ARTHROSCOPY WITH PARTIAL LATERAL AND MEDIAL MENISECTOMY,  PARTIAL  SYNOVECTOMY AND CHONDROPLASTY;  Surgeon: Lovell Sheehan, MD;  Location: [facility];  Service: Orthopedics;  Laterality: Right;   LAPAROSCOPIC SALPINGO OOPHERECTOMY Left 04/11/2017   Procedure: LAPAROSCOPIC LEFT SALPINGO OOPHORECTOMY;  Surgeon: Brayton Mars, MD;  Location: [facility];  Service: Gynecology;  Laterality: Left;   LITHOTRIPSY     OOPHORECTOMY     PARTIAL HYSTERECTOMY     [place] IMPEDANCE STUDY N/A 10/11/2018   Procedure: [place] IMPEDANCE STUDY;  Surgeon: Mauri Pole, MD;  [facility];  Service: Endoscopy;  Laterality: N/A;   PVC ABLATION N/A 01/18/2020   Procedure: PVC ABLATION;  Surgeon: Thompson Grayer, MD;  Location: [facility];  Service: Cardiovascular;  Laterality: N/A;   thumb surgery     UPPER GASTROINTESTINAL ENDOSCOPY      Family History  Problem Relation Age of Onset   Stroke Father    Diabetes Father    Breast cancer Mother 32   Diverticulitis Mother    Esophageal cancer Maternal Grandfather    Colon cancer Paternal Grandmother    Suicidality Brother    Ovarian cancer Neg Hx    Stomach cancer Neg Hx    Social History   Socioeconomic History   Marital status: Single    Spouse name: Not on file   Number of children: 4   Years of education: Not on file   Highest education level: Some college, no degree  Occupational History   Occupation: Disabled  Tobacco Use   Smoking status: Former    Types: Cigarettes    Quit date: 04/08/1995    Years since quitting: 27.0   Smokeless tobacco: Never  Vaping Use   Vaping Use: Never used  Substance and Sexual Activity   Alcohol use: Not Currently    Alcohol/week: 1.0 standard drink of alcohol    Types: 1 Glasses of wine per week    Comment: rare; once every 6 months   Drug use: No   Sexual activity: Yes    Partners: Male    Birth control/protection: Surgical  Other Topics Concern   Not on file  Social History Narrative   Separated - 1 son and 1 daughter   Disabled    1  caffeine/day   Past smoker   No EtOH, drugs      08/02/2018      Social Determinants of Health   Financial Resource Strain: Low Risk  (08/05/2021)   Overall Financial Resource Strain (CARDIA)    Difficulty of Paying Living Expenses: Not hard at all  Food Insecurity: No Food Insecurity (08/05/2021)   Hunger Vital Sign    Worried About Running Out of Food in the Last Year: Never true    Ran Out of Food in the Last Year: Never true  Transportation Needs: No Transportation  Needs (08/05/2021)   PRAPARE - Hydrologist (Medical): No    Lack of Transportation (Non-Medical): No  Physical Activity: Sufficiently Active (08/05/2021)   Exercise Vital Sign    Days of Exercise per Week: 6 days    Minutes of Exercise per Session: 60 min  Stress: No Stress Concern Present (08/05/2021)   [place]    Feeling of Stress : Not at all  Social Connections: Somewhat Isolated (10/13/2018)   Social Connection and Isolation Panel [NHANES]    Frequency of Communication with Friends and Family: More than three times a week    Frequency of Social Gatherings with Friends and Family: Once a week    Attends Religious Services: More than 4 times per year    Active Member of [employer] or Organizations: No    Attends Archivist Meetings: Never    Marital Status: Separated    Review of Systems  Constitutional:  Negative for chills, fatigue and fever.  HENT:  Positive for rhinorrhea. Negative for congestion, ear pain and sore throat.   Respiratory:  Positive for cough and shortness of breath.   Cardiovascular:  Positive for leg swelling. Negative for chest pain.  Gastrointestinal:  Positive for constipation and diarrhea. Negative for abdominal pain and vomiting.  Genitourinary:  Positive for difficulty urinating. Negative for dysuria and urgency.  Musculoskeletal:  Positive for myalgias. Negative for back pain.   Neurological:  Positive for headaches. Negative for dizziness, weakness and light-headedness.  Hematological:  Negative for adenopathy.  Psychiatric/Behavioral:  Positive for dysphoric mood. The patient is not nervous/anxious.      Objective:  BP 120/74   Pulse 72   Temp (!) 97.4 F (36.3 C)   Resp 16   Ht 5' 4"  (1.626 m)   Wt 176 lb (79.8 kg)   BMI 30.21 kg/m      04/19/2022    8:13 AM 03/03/2022    7:59 AM 02/24/2022    3:28 PM  BP/Weight  Systolic BP 342 90 876  Diastolic BP 74 60 70  Wt. (Lbs) 176 177 178  BMI 30.21 kg/m2 30.38 kg/m2 30.55 kg/m2    Physical Exam Vitals reviewed.  Constitutional:      Appearance: Normal appearance. She is normal weight.  Neck:     Vascular: No carotid bruit.  Cardiovascular:     Rate and Rhythm: Normal rate and regular rhythm.     Heart sounds: Normal heart sounds.  Pulmonary:     Effort: Pulmonary effort is normal. No respiratory distress.     Breath sounds: Normal breath sounds.  Abdominal:     General: Abdomen is flat. Bowel sounds are normal.     Palpations: Abdomen is soft.     Tenderness: There is no abdominal tenderness.  Musculoskeletal:        General: Tenderness (FM trigger points.) present.  Neurological:     Mental Status: She is alert and oriented to person, place, and time.  Psychiatric:        Mood and Affect: Mood normal.        Behavior: Behavior normal.     Diabetic Foot Exam - Simple   No data filed      Lab Results  Component Value Date   WBC 5.0 01/11/2022   HGB 13.5 01/11/2022   HCT 41.6 01/11/2022   PLT 311 01/11/2022   GLUCOSE 97 01/11/2022   CHOL 279 (H) 01/11/2022  TRIG 108 01/11/2022   HDL 77 01/11/2022   LDLDIRECT 222 (H) 04/26/2019   LDLCALC 183 (H) 01/11/2022   ALT 13 01/11/2022   AST 15 01/11/2022   NA 140 01/11/2022   K 4.8 01/11/2022   CL 104 01/11/2022   CREATININE 0.74 01/11/2022   BUN 15 01/11/2022   CO2 25 01/11/2022   TSH 1.840 01/11/2022   INR 0.9 08/11/2017    HGBA1C 5.9 (H) 01/11/2022      Assessment & Plan:   Problem List Items Addressed This Visit       Cardiovascular and Mediastinum   Essential hypertension - Primary    Well controlled.  Change valsartan 320 mg daily to lisinopril 40 mg twice daily. Continue to work on eating a healthy diet and exercise.  Labs drawn today.          Respiratory   OSA on CPAP    Continue CPAP.        Endocrine   Hypothyroidism, acquired    Continue Synthroid 112 mcg every morning.        Other   Fibromyalgia (Chronic)    Not at goal.  Continue current medications.      Mixed hyperlipidemia    Not at goal.  Continue working on diet and exercise.  I would still recommend Praluent.  Consider Nexletol      Relevant Orders   CBC with Differential/Platelet   Comprehensive metabolic panel   Lipid panel   Vitamin B12 deficiency    Continue B12      Prediabetes    Continue to work on healthy diet and exercise.        Relevant Orders   Hemoglobin A1c   Dysuria    Urinalysis normal.      Relevant Orders   POCT urinalysis dipstick (Completed)   Moderate recurrent major depression ([place])    Defer management to [place].      .  Meds ordered this encounter  Medications   DISCONTD: quinapril (ACCUPRIL) 40 MG tablet    Sig: Take 1 tablet (40 mg total) by mouth in the morning and at bedtime.    Dispense:  180 tablet    Refill:  1   mometasone (ELOCON) 0.1 % cream    Sig: Apply 1 Application topically daily.    Dispense:  50 g    Refill:  2    Orders Placed This Encounter  Procedures   CBC with Differential/Platelet   Comprehensive metabolic panel   Lipid panel   Hemoglobin A1c   POCT urinalysis dipstick    Total time spent on today's visit was greater than 30 minutes, including both face-to-face time and nonface-to-face time personally spent on review of chart (labs and imaging), discussing labs and goals, discussing further work-up, treatment options, referrals to  specialist if needed, reviewing outside records of pertinent, answering patient's questions, and coordinating care.  Follow-up: Return in about 3 months (around 07/20/2022) for chronic fasting.  An After Visit Summary was printed and given to the patient.  Rochel Brome, MD [facility] [phone]

## 2022-04-19 NOTE — Assessment & Plan Note (Signed)
Continue Synthroid 112 mcg every morning.

## 2022-04-19 NOTE — Assessment & Plan Note (Signed)
Continue to work on healthy diet and exercise.   

## 2022-04-19 NOTE — Assessment & Plan Note (Signed)
Not at goal.  Continue working on diet and exercise.  I would still recommend Praluent.  Consider Nexletol

## 2022-04-19 NOTE — Patient Instructions (Signed)
Discontinue valsartan.  Start on quinapril 40 mg twice daily.

## 2022-04-19 NOTE — Assessment & Plan Note (Signed)
Well controlled.  Change valsartan 320 mg daily to lisinopril 40 mg twice daily. Continue to work on eating a healthy diet and exercise.  Labs drawn today.

## 2022-04-19 NOTE — Assessment & Plan Note (Signed)
Not at goal.  Continue current medications.

## 2022-04-19 NOTE — Assessment & Plan Note (Signed)
Continue CPAP.  

## 2022-04-19 NOTE — Assessment & Plan Note (Signed)
Urinalysis normal

## 2022-04-19 NOTE — Assessment & Plan Note (Signed)
Defer management to [place].

## 2022-04-19 NOTE — Telephone Encounter (Signed)
Patient was informed.

## 2022-04-19 NOTE — Assessment & Plan Note (Signed)
Continue B12. 

## 2022-04-19 NOTE — Telephone Encounter (Signed)
Pt called in stating the bp meds that was sent in this morning is no longer on the self. She stated that Dr. Tobie Poet would call something else in that she was aware that bp meds may not be available.

## 2022-04-20 LAB — CBC WITH DIFFERENTIAL/PLATELET
Basophils Absolute: 0 10*3/uL (ref 0.0–0.2)
Basos: 1 %
EOS (ABSOLUTE): 0.1 10*3/uL (ref 0.0–0.4)
Eos: 2 %
Hematocrit: 43.1 % (ref 34.0–46.6)
Hemoglobin: 13.7 g/dL (ref 11.1–15.9)
Immature Grans (Abs): 0 10*3/uL (ref 0.0–0.1)
Immature Granulocytes: 0 %
Lymphocytes Absolute: 1.7 10*3/uL (ref 0.7–3.1)
Lymphs: 35 %
MCH: 28.2 pg (ref 26.6–33.0)
MCHC: 31.8 g/dL (ref 31.5–35.7)
MCV: 89 fL (ref 79–97)
Monocytes Absolute: 0.4 10*3/uL (ref 0.1–0.9)
Monocytes: 9 %
Neutrophils Absolute: 2.5 10*3/uL (ref 1.4–7.0)
Neutrophils: 53 %
Platelets: 339 10*3/uL (ref 150–450)
RBC: 4.85 x10E6/uL (ref 3.77–5.28)
RDW: 13.1 % (ref 11.7–15.4)
WBC: 4.7 10*3/uL (ref 3.4–10.8)

## 2022-04-20 LAB — LIPID PANEL
Chol/HDL Ratio: 4.9 ratio — ABNORMAL HIGH (ref 0.0–4.4)
Cholesterol, Total: 330 mg/dL — ABNORMAL HIGH (ref 100–199)
HDL: 67 mg/dL (ref >39)
LDL Chol Calc (NIH): 236 mg/dL — ABNORMAL HIGH (ref 0–99)
Triglycerides: 147 mg/dL (ref 0–149)
VLDL Cholesterol Cal: 27 mg/dL (ref 5–40)

## 2022-04-20 LAB — COMPREHENSIVE METABOLIC PANEL
ALT: 13 IU/L (ref 0–32)
AST: 17 IU/L (ref 0–40)
Albumin/Globulin Ratio: 1.9 (ref 1.2–2.2)
Albumin: 4.6 g/dL (ref 3.8–4.9)
Alkaline Phosphatase: 55 IU/L (ref 44–121)
BUN/Creatinine Ratio: 19 (ref 9–23)
BUN: 15 mg/dL (ref 6–24)
Bilirubin Total: 0.4 mg/dL (ref 0.0–1.2)
CO2: 24 mmol/L (ref 20–29)
Calcium: 9.9 mg/dL (ref 8.7–10.2)
Chloride: 102 mmol/L (ref 96–106)
Creatinine, Ser: 0.79 mg/dL (ref 0.57–1.00)
Globulin, Total: 2.4 g/dL (ref 1.5–4.5)
Glucose: 106 mg/dL — ABNORMAL HIGH (ref 70–99)
Potassium: 4.6 mmol/L (ref 3.5–5.2)
Sodium: 139 mmol/L (ref 134–144)
Total Protein: 7 g/dL (ref 6.0–8.5)
eGFR: 88 mL/min/{1.73_m2} (ref >59)

## 2022-04-20 LAB — HEMOGLOBIN A1C
Est. average glucose Bld gHb Est-mCnc: 120 mg/dL
Hgb A1c MFr Bld: 5.8 % — ABNORMAL HIGH (ref 4.8–5.6)

## 2022-04-20 LAB — CARDIOVASCULAR RISK ASSESSMENT

## 2022-04-21 NOTE — Progress Notes (Signed)
Blood count normal.  Liver function normal.  Kidney function normal.  Cholesterol: LDL 236.Patient needs to be on praluent. It was in process for PAP. Please ask patient if she has returned everything that ccm needed. If so, please send to [facility]. HBA1C: stable. 5.8.

## 2022-04-23 ENCOUNTER — Telehealth: Payer: Self-pay

## 2022-04-23 NOTE — Progress Notes (Signed)
I reached out to pt today to check on her PAP for Praluent. Pt stated she turned everything in but we do not see the application. I had to lvm to return my call.  04/28/22- Spoke with pt today and she stated that she found her paperwork and thought she had turned it in but she will be taking it and dropping it off at the office today after 2pm to have everything faxed in for her.   Elray Mcgregor, [place] Pharmacist Assistant  [phone]

## 2022-04-26 ENCOUNTER — Ambulatory Visit (INDEPENDENT_AMBULATORY_CARE_PROVIDER_SITE_OTHER): Payer: Medicare Other | Admitting: Family Medicine

## 2022-04-26 ENCOUNTER — Encounter: Payer: Self-pay | Admitting: Family Medicine

## 2022-04-26 VITALS — BP 118/78 | HR 80 | Temp 97.3°F | Resp 16 | Ht 64.0 in | Wt 175.0 lb

## 2022-04-26 DIAGNOSIS — I10 Essential (primary) hypertension: Secondary | ICD-10-CM | POA: Diagnosis not present

## 2022-04-26 DIAGNOSIS — E782 Mixed hyperlipidemia: Secondary | ICD-10-CM | POA: Diagnosis not present

## 2022-04-26 DIAGNOSIS — M797 Fibromyalgia: Secondary | ICD-10-CM

## 2022-04-26 DIAGNOSIS — E559 Vitamin D deficiency, unspecified: Secondary | ICD-10-CM | POA: Diagnosis not present

## 2022-04-26 DIAGNOSIS — R5383 Other fatigue: Secondary | ICD-10-CM

## 2022-04-26 MED ORDER — VALSARTAN 160 MG PO TABS: 160.0000 mg | ORAL_TABLET | Freq: Every day | ORAL | 0 refills | Status: DC

## 2022-04-26 MED ORDER — AUVELITY 45-105 MG PO TBCR: EXTENDED_RELEASE_TABLET | ORAL | 0 refills | Status: AC

## 2022-04-26 NOTE — Assessment & Plan Note (Signed)
Patient relates she is part of the Praluent PAP application at home.  She went to bring it today.  She will bring it back another day.

## 2022-04-26 NOTE — Assessment & Plan Note (Addendum)
Off lisinopril.  Restart valsartan at lower dose of 160 mg daily.  Perhaps this will control blood pressure without causing side effects of fatigue. Patient to check blood pressure at home.

## 2022-04-27 LAB — COMPREHENSIVE METABOLIC PANEL
ALT: 16 IU/L (ref 0–32)
AST: 16 IU/L (ref 0–40)
Albumin/Globulin Ratio: 1.7 (ref 1.2–2.2)
Albumin: 4.7 g/dL (ref 3.8–4.9)
Alkaline Phosphatase: 63 IU/L (ref 44–121)
BUN/Creatinine Ratio: 30 — ABNORMAL HIGH (ref 9–23)
BUN: 21 mg/dL (ref 6–24)
Bilirubin Total: 0.3 mg/dL (ref 0.0–1.2)
CO2: 26 mmol/L (ref 20–29)
Calcium: 10.7 mg/dL — ABNORMAL HIGH (ref 8.7–10.2)
Chloride: 101 mmol/L (ref 96–106)
Creatinine, Ser: 0.7 mg/dL (ref 0.57–1.00)
Globulin, Total: 2.7 g/dL (ref 1.5–4.5)
Glucose: 94 mg/dL (ref 70–99)
Potassium: 4.8 mmol/L (ref 3.5–5.2)
Sodium: 142 mmol/L (ref 134–144)
Total Protein: 7.4 g/dL (ref 6.0–8.5)
eGFR: 101 mL/min/{1.73_m2} (ref >59)

## 2022-04-27 LAB — CBC WITH DIFFERENTIAL/PLATELET
Basophils Absolute: 0.1 10*3/uL (ref 0.0–0.2)
Basos: 1 %
EOS (ABSOLUTE): 0.2 10*3/uL (ref 0.0–0.4)
Eos: 3 %
Hematocrit: 44.6 % (ref 34.0–46.6)
Hemoglobin: 14.3 g/dL (ref 11.1–15.9)
Immature Grans (Abs): 0 10*3/uL (ref 0.0–0.1)
Immature Granulocytes: 0 %
Lymphocytes Absolute: 2.1 10*3/uL (ref 0.7–3.1)
Lymphs: 37 %
MCH: 28.4 pg (ref 26.6–33.0)
MCHC: 32.1 g/dL (ref 31.5–35.7)
MCV: 89 fL (ref 79–97)
Monocytes Absolute: 0.5 10*3/uL (ref 0.1–0.9)
Monocytes: 9 %
Neutrophils Absolute: 2.8 10*3/uL (ref 1.4–7.0)
Neutrophils: 50 %
Platelets: 319 10*3/uL (ref 150–450)
RBC: 5.03 x10E6/uL (ref 3.77–5.28)
RDW: 12.8 % (ref 11.7–15.4)
WBC: 5.6 10*3/uL (ref 3.4–10.8)

## 2022-04-27 LAB — MAGNESIUM: Magnesium: 2.3 mg/dL (ref 1.6–2.3)

## 2022-04-27 LAB — PHOSPHORUS: Phosphorus: 3.5 mg/dL (ref 3.0–4.3)

## 2022-04-27 LAB — VITAMIN D 25 HYDROXY (VIT D DEFICIENCY, FRACTURES): Vit D, 25-Hydroxy: 38.2 ng/mL (ref 30.0–100.0)

## 2022-05-06 ENCOUNTER — Telehealth: Payer: Self-pay

## 2022-05-07 ENCOUNTER — Encounter: Payer: Self-pay | Admitting: Family Medicine

## 2022-05-07 NOTE — Telephone Encounter (Signed)
Left message with medication changes.  Patient instructed to go to the Urgent Care over the week-end if symptoms do not improve.

## 2022-05-07 NOTE — Telephone Encounter (Signed)
Patient also called office. She gave all the same information. She has not taken valsartan this morning. She is concerned. She has been short of breath gradually worsening. She plans to take lasix and hydrochlorothiazide today to help get swelling down. Weight has increased to 182 lb. Denies chest pain, chest tightness.   Royce Macadamia, [place] 05/07/22 8:26 AM

## 2022-05-07 NOTE — Chronic Care Management (AMB) (Signed)
Faxed patient assistance application to [employer] patient assistance program for Praluent 75 mg/ml.  Pattricia Boss, [place] Pharmacist Assistant [phone]

## 2022-05-11 ENCOUNTER — Ambulatory Visit (INDEPENDENT_AMBULATORY_CARE_PROVIDER_SITE_OTHER): Payer: Medicare Other | Admitting: Family Medicine

## 2022-05-11 VITALS — BP 140/72 | HR 68 | Temp 97.4°F | Resp 18 | Ht 64.0 in | Wt 182.0 lb

## 2022-05-11 DIAGNOSIS — R300 Dysuria: Secondary | ICD-10-CM | POA: Diagnosis not present

## 2022-05-11 DIAGNOSIS — R0602 Shortness of breath: Secondary | ICD-10-CM | POA: Diagnosis not present

## 2022-05-11 DIAGNOSIS — R601 Generalized edema: Secondary | ICD-10-CM | POA: Diagnosis not present

## 2022-05-11 LAB — POCT URINALYSIS DIPSTICK
Bilirubin, UA: NEGATIVE
Blood, UA: NEGATIVE
Glucose, UA: NEGATIVE
Ketones, UA: NEGATIVE
Leukocytes, UA: NEGATIVE
Nitrite, UA: NEGATIVE
Protein, UA: NEGATIVE
Spec Grav, UA: 1.01 (ref 1.010–1.025)
Urobilinogen, UA: 0.2 E.U./dL
pH, UA: 6 (ref 5.0–8.0)

## 2022-05-11 MED ORDER — CHLORTHALIDONE 25 MG PO TABS: 25.0000 mg | ORAL_TABLET | Freq: Every day | ORAL | 1 refills | Status: DC

## 2022-05-11 NOTE — Patient Instructions (Addendum)
Stop hydrochlorothiazide.   Start chlorthalidone 25 mg daily-dose 30 minutes prior to taking a.m. Lasix..  Continue Lasix 20 mg twice daily.
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
## 2022-05-12 ENCOUNTER — Encounter: Payer: Self-pay | Admitting: Family Medicine

## 2022-05-12 LAB — CBC WITH DIFFERENTIAL/PLATELET
Basophils Absolute: 0 10*3/uL (ref 0.0–0.2)
Basos: 1 %
EOS (ABSOLUTE): 0.2 10*3/uL (ref 0.0–0.4)
Eos: 3 %
Hematocrit: 38.7 % (ref 34.0–46.6)
Hemoglobin: 12.5 g/dL (ref 11.1–15.9)
Immature Grans (Abs): 0.1 10*3/uL (ref 0.0–0.1)
Immature Granulocytes: 1 %
Lymphocytes Absolute: 2.5 10*3/uL (ref 0.7–3.1)
Lymphs: 40 %
MCH: 28.4 pg (ref 26.6–33.0)
MCHC: 32.3 g/dL (ref 31.5–35.7)
MCV: 88 fL (ref 79–97)
Monocytes Absolute: 0.5 10*3/uL (ref 0.1–0.9)
Monocytes: 8 %
Neutrophils Absolute: 3 10*3/uL (ref 1.4–7.0)
Neutrophils: 47 %
Platelets: 316 10*3/uL (ref 150–450)
RBC: 4.4 x10E6/uL (ref 3.77–5.28)
RDW: 13.1 % (ref 11.7–15.4)
WBC: 6.3 10*3/uL (ref 3.4–10.8)

## 2022-05-12 LAB — COMPREHENSIVE METABOLIC PANEL
ALT: 13 IU/L (ref 0–32)
AST: 16 IU/L (ref 0–40)
Albumin/Globulin Ratio: 1.9 (ref 1.2–2.2)
Albumin: 4.4 g/dL (ref 3.8–4.9)
Alkaline Phosphatase: 56 IU/L (ref 44–121)
BUN/Creatinine Ratio: 22 (ref 9–23)
BUN: 17 mg/dL (ref 6–24)
Bilirubin Total: 0.4 mg/dL (ref 0.0–1.2)
CO2: 26 mmol/L (ref 20–29)
Calcium: 9.8 mg/dL (ref 8.7–10.2)
Chloride: 99 mmol/L (ref 96–106)
Creatinine, Ser: 0.77 mg/dL (ref 0.57–1.00)
Globulin, Total: 2.3 g/dL (ref 1.5–4.5)
Glucose: 95 mg/dL (ref 70–99)
Potassium: 3.4 mmol/L — ABNORMAL LOW (ref 3.5–5.2)
Sodium: 139 mmol/L (ref 134–144)
Total Protein: 6.7 g/dL (ref 6.0–8.5)
eGFR: 90 mL/min/{1.73_m2} (ref >59)

## 2022-05-12 LAB — TSH: TSH: 1.41 u[IU]/mL (ref 0.450–4.500)

## 2022-05-12 LAB — PRO B NATRIURETIC PEPTIDE: NT-Pro BNP: 114 pg/mL (ref 0–287)

## 2022-05-13 ENCOUNTER — Encounter: Payer: Self-pay | Admitting: Family Medicine

## 2022-05-13 NOTE — Assessment & Plan Note (Signed)
UA normal.

## 2022-05-13 NOTE — Assessment & Plan Note (Signed)
Stop hydrochlorothiazide.   Start chlorthalidone 25 mg daily-dose 30 minutes prior to taking a.m. Lasix..  Continue Lasix 20 mg twice daily.

## 2022-05-14 ENCOUNTER — Telehealth: Payer: Self-pay | Admitting: Cardiovascular Disease

## 2022-05-14 NOTE — Telephone Encounter (Signed)
3 attempts to schedule fu appt from recall list.   Deleting recall.   

## 2022-05-18 ENCOUNTER — Other Ambulatory Visit: Payer: Self-pay | Admitting: Family Medicine

## 2022-05-18 DIAGNOSIS — E039 Hypothyroidism, unspecified: Secondary | ICD-10-CM

## 2022-05-24 ENCOUNTER — Encounter: Payer: Self-pay | Admitting: Physical Medicine and Rehabilitation

## 2022-05-24 ENCOUNTER — Encounter
Payer: Medicare Other | Attending: Physical Medicine and Rehabilitation | Admitting: Physical Medicine and Rehabilitation

## 2022-05-24 VITALS — BP 149/101 | HR 67 | Ht 64.0 in | Wt 180.2 lb

## 2022-05-24 DIAGNOSIS — F331 Major depressive disorder, recurrent, moderate: Secondary | ICD-10-CM

## 2022-05-24 DIAGNOSIS — M7918 Myalgia, other site: Secondary | ICD-10-CM

## 2022-05-24 DIAGNOSIS — T7840XS Allergy, unspecified, sequela: Secondary | ICD-10-CM

## 2022-05-24 DIAGNOSIS — M797 Fibromyalgia: Secondary | ICD-10-CM | POA: Diagnosis not present

## 2022-05-24 DIAGNOSIS — T7840XA Allergy, unspecified, initial encounter: Secondary | ICD-10-CM | POA: Insufficient documentation

## 2022-05-24 MED ORDER — TIZANIDINE HCL 4 MG PO TABS: 8.0000 mg | ORAL_TABLET | Freq: Two times a day (BID) | ORAL | 5 refills | Status: DC

## 2022-05-24 NOTE — Patient Instructions (Signed)
Pt is a 56 yr old female with hx of HTN, moderate persistent asthma; hypothyroidism- latest TSH 1.41, fibromyalgia, Migraines, and BMI 31; Told she has Kidney disease? Thinks due to swelling up so much- -  Latest BUN/Cr 22/0.77.  Has many allergies 30 meds- also has IBS Here for evaluation for pain.  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  Also has trochanteric bursitis.   Needs allergist referral- to look at Eosinophilic esophagitis vs MAST cell syndrome. Will send to [place] [place] [facility] for this.   2.  Myofascial pain syndrome- Will need to get back ASAP for trigger point injections. As long as can find out tolerates lidocaine.   3. Lidocaine patches- over the counter- use one that has ONLY lidocaine in it- try over the counter- if helpful, then will try to get insurance wise/Rx, because allergic to so many things- are 12 hrs on; 12 hrs off- can use up to 3 patches at a time.   4. Low dose naltrexone 2 mg nightly- will do liquid if possible- will do for 1 week, and if effective- then will call in 4 mg nightly. [place], [place]  Will help fibromyalgia but not myofascial pain syndrome.   5.  Theracane- hold pressure- ease into it-  2 minutes minimum - on each spot.  Can get one on [facility]. Will never have to replace it.   6. Will increase Zanaflex/Tizanidine- 8 mg 2x/day for muscle spasms- can help high BP as well - #120- 5 refills.   7. No weight lifting- until myofascial pain under better control.   8. Magnesium 250-400 mg- over the counter- up to 2-3x/day- only side effect loose stools. Is a natural muscle relaxant.  9. Try a knee brace? Cannot tolerate due to pressure-  Might be able to tolerate after Naltrexone or other medicine.   10. Think about Keppra/Trileptal for fibromyalgia. If naltrexone doesn't work.    11. Wait 72 hours with 1 change at a time.   12. Call/my chart  me in 7-10 days to let me know how  things working- or let me know  13. Dr Sharion Balloon- The survivor's Handbook to Fibromyalgia and Myofascial pain syndrome.   14. Tennis ball and or rolling pin- same idea as theracane. Hold pressure 2-4 minutes.   15. F/U in 3 months-  Wait list for trigger point injections.

## 2022-05-24 NOTE — Progress Notes (Signed)
Subjective:    Patient ID: Laura [name], female    DOB: [birth year], 56 y.o.   MRN: [id number]  HPI  Pt is a 56 yr old female with hx of HTN, moderate persistent asthma; hypothyroidism- latest TSH 1.41, fibromyalgia, Migraines, and BMI 31; Told she has Kidney disease- but Latest BUN/Cr 22/0.77.  Has many allergies 30 meds-  Here for evaluation for pain.   Hurts 24/7- everywhere-  Sometimes more on L side than R side- all the joints in left side.   Aching, throbbing, burning.  Back is burning- muscles ache and throb;   Has allergic reaction to shellfish- but main swelling occurs with  Wonders about gluten-    Arthritis panel is normal.   Allergic/intolerant to: Gabapentin, Lyrica, Cymbalta, Savella, NSAIDS; Tramadol makes her itch; Codeine makes her itch Norco keeps her awake and Oxy makes her itch.   Can go to sleep, but cannot stay asleep-    Also agrees, is depressed- Anti depressants-makes her "more crazy"- memory becomes poor- doesn't increase anxiety, but makes her more foggy.   Hallucinates with Cymbalta   Tried: Takes Zanaflex 8 mg QHS- helps her relax Hasn't tried Abilify- Dr Toy Care is psychiatry    Has tried steroid and gel injections on R knee.   Social Hx : Disabled but works at [employer] 14 hours/week part time Used to smoke Denies EtOH, drugs Pain Inventory Average Pain 8 Pain Right Now 8 My pain is sharp, burning, tingling, and aching  In the last 24 hours, has pain interfered with the following? General activity 8 Relation with others 0 Enjoyment of life 10 What TIME of day is your pain at its worst? morning , daytime, evening, and night Sleep (in general) Poor  Pain is worse with: sitting Pain improves with:  nothing helps Relief from Meds:  on no meds  walk without assistance ability to climb steps?  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  no  Any changes since last visit?  no    Family History  Problem Relation Age of Onset   Stroke Father    Diabetes Father    Breast cancer Mother 62   Diverticulitis Mother    Esophageal cancer Maternal Grandfather    Colon cancer Paternal Grandmother    Suicidality Brother    Ovarian cancer Neg Hx    Stomach cancer Neg Hx    Social History   Socioeconomic History   Marital status: Single    Spouse name: Not on file   Number of children: 4   Years of education: Not on file   Highest education level: Some college, no degree  Occupational History   Occupation: Disabled  Tobacco Use   Smoking status: Former    Types: Cigarettes    Quit date: 04/08/1995    Years since quitting: 27.1   Smokeless tobacco: Never  Vaping Use   Vaping Use: Never used  Substance and Sexual Activity   Alcohol use: Not Currently    Alcohol/week: 1.0 standard drink of alcohol    Types: 1 Glasses of wine per week    Comment: rare; once every 6 months   Drug use: No   Sexual activity: Yes    Partners: Male    Birth control/protection: Surgical  Other Topics Concern   Not on file  Social History Narrative  Separated - 1 son and 1 daughter   Disabled    1 caffeine/day   Past smoker   No EtOH, drugs      08/02/2018      Social Determinants of Health   Financial Resource Strain: Low Risk  (08/05/2021)   Overall Financial Resource Strain (CARDIA)    Difficulty of Paying Living Expenses: Not hard at all  Food Insecurity: No Food Insecurity (08/05/2021)   Hunger Vital Sign    Worried About Running Out of Food in the Last Year: Never true    Ran Out of Food in the Last Year: Never true  Transportation Needs: No Transportation Needs (08/05/2021)   PRAPARE - Hydrologist (Medical): No    Lack of Transportation (Non-Medical): No  Physical Activity: Sufficiently Active  (08/05/2021)   Exercise Vital Sign    Days of Exercise per Week: 6 days    Minutes of Exercise per Session: 60 min  Stress: No Stress Concern Present (08/05/2021)   [place]    Feeling of Stress : Not at all  Social Connections: Somewhat Isolated (10/13/2018)   Social Connection and Isolation Panel [NHANES]    Frequency of Communication with Friends and Family: More than three times a week    Frequency of Social Gatherings with Friends and Family: Once a week    Attends Religious Services: More than 4 times per year    Active Member of [employer] or Organizations: No    Attends Archivist Meetings: Never    Marital Status: Separated   Past Surgical History:  Procedure Laterality Date   24 HOUR [place] STUDY N/A 10/11/2018   Procedure: [place] STUDY;  Surgeon: Mauri Pole, MD;  [facility];  Service: Endoscopy;  Laterality: N/A;   ABDOMINAL HYSTERECTOMY     ANKLE SURGERY     ran over by mother in car by [place]  09/02/2019   Procedure: BIOPSY;  Surgeon: Rush Landmark Telford Nab., MD;  Location: [place];  Service: Gastroenterology;;   COLONOSCOPY     COLONOSCOPY WITH PROPOFOL N/A 09/02/2019   Procedure: COLONOSCOPY WITH PROPOFOL;  Surgeon: Irving Copas., MD;  Location: [place];  Service: Gastroenterology;  Laterality: N/A;   COLPORRHAPHY  2015   posterior and enterocele ligation   CYSTOSCOPY  04/11/2017   Procedure: CYSTOSCOPY;  Surgeon: Defrancesco, Alanda Slim, MD;  Location: [facility];  Service: Gynecology;;   ESOPHAGEAL MANOMETRY N/A 10/11/2018   Procedure: ESOPHAGEAL MANOMETRY (EM);  Surgeon: Mauri Pole, MD;  [facility];  Service: Endoscopy;  Laterality: N/A;   ESOPHAGOGASTRODUODENOSCOPY (EGD) WITH PROPOFOL N/A 09/02/2019   Procedure: ESOPHAGOGASTRODUODENOSCOPY (EGD) WITH PROPOFOL;  Surgeon: Rush Landmark Telford Nab., MD;  Location: [place];  Service: Gastroenterology;  Laterality: N/A;   EXTRACORPOREAL SHOCK WAVE LITHOTRIPSY Left 07/31/2020   Procedure: EXTRACORPOREAL SHOCK WAVE LITHOTRIPSY (ESWL);  Surgeon: Billey Co, MD;  Location: [facility];  Service: Urology;  Laterality: Left;   KNEE ARTHROSCOPY WITH MEDIAL MENISECTOMY Right 02/04/2020   Procedure: KNEE ARTHROSCOPY WITH PARTIAL LATERAL AND MEDIAL MENISECTOMY,  PARTIAL SYNOVECTOMY AND CHONDROPLASTY;  Surgeon: Lovell Sheehan, MD;  Location: [place];  Service: Orthopedics;  Laterality: Right;   LAPAROSCOPIC SALPINGO OOPHERECTOMY Left 04/11/2017   Procedure: LAPAROSCOPIC LEFT SALPINGO OOPHORECTOMY;  Surgeon: Brayton Mars, MD;  Location: [facility];  Service: Gynecology;  Laterality: Left;   LITHOTRIPSY  [place] IMPEDANCE STUDY N/A 10/11/2018   Procedure: [place] IMPEDANCE STUDY;  Surgeon: Mauri Pole, MD;  [facility];  Service: Endoscopy;  Laterality: N/A;   PVC ABLATION N/A 01/18/2020   Procedure: PVC ABLATION;  Surgeon: Thompson Grayer, MD;  Location: [facility];  Service: Cardiovascular;  Laterality: N/A;   thumb surgery     UPPER GASTROINTESTINAL ENDOSCOPY     Past Medical History:  Diagnosis Date   Acute GI bleeding 09/01/2019   Anxiety and depression    Bowel obstruction ([place])    Chest pain    a. 01/2016 Ex MV: Hypertensive response. Freq PVCs w/ exercise. nl EF. No ST/T changes. No ischemia.   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 03/2016 Echo: EF 60-65%, no rwma, mild MR, nl LA size, nl RV fxn.   High cholesterol    Kidney stones    Nasal septal perforation 05/12/2018   Hx of cocaine use   Palpitations    a. 03/2016 Holter: Sinus rhythm, avg HR 83, max 123, min 64. 4 PACs. 10,356 isolated PVCs, one vent couplet, 3842 V bigeminy, 4 beats NSVT->prev on BB - dc 2/2  swelling.   Prediabetes 12/23/2015   Overview:  Hba1c higher but not diabetic. Took metformin to try to lessen   Raynaud disease    Rectocele    Shingles    Sleep apnea    "mild" per pt   Torn rotator cuff    left   Urinary retention with incomplete bladder emptying    Vaginal dryness, menopausal    Vaginal enterocele    Vitamin D deficiency 12/03/2014   Wears hearing aid in both ears    BP (!) 149/101   Pulse 67   Ht '5\' 4"'$  (1.626 m)   Wt 180 lb 3.2 oz (81.7 kg)   SpO2 98%   BMI 30.93 kg/m   Opioid Risk Score:   Fall Risk Score:  `1  Depression screen PHQ 2/9     05/24/2022    1:32 PM 04/19/2022    8:18 AM 11/24/2021   11:19 AM 08/05/2021    9:24 AM 02/03/2021    7:44 AM 06/20/2019    8:03 AM 12/05/2018    2:08 PM  Depression screen PHQ 2/9  Decreased Interest '3 3 3 '$ 0 1 2 0  Down, Depressed, Hopeless '3 3 3 '$ 0 1 1 0  PHQ - 2 Score '6 6 6 '$ 0 2 3 0  Altered sleeping '3 3 3  3 3 '$ 0  Tired, decreased energy '3 3 3  3 2 '$ 0  Change in appetite 2 2 0  0 2 0  Feeling bad or failure about yourself  0 0 '2  1 1 '$ 0  Trouble concentrating '2 1 3  3 1 '$ 0  Moving slowly or fidgety/restless '2 2 2  1 1 '$ 0  Suicidal thoughts 0 0 0  0 0 0  PHQ-9 Score '18 17 19  13 13 '$ 0  Difficult doing work/chores Very difficult Extremely dIfficult Extremely dIfficult  Extremely dIfficult Somewhat difficult Not difficult at all     Review of Systems  Constitutional:  Positive for unexpected weight change.  HENT: Negative.    Eyes: Negative.   Respiratory:  Positive for cough.  Cardiovascular:  Positive for leg swelling.  Gastrointestinal:  Positive for abdominal pain, constipation and diarrhea.  Endocrine: Negative.   Genitourinary:  Positive for difficulty urinating.  Musculoskeletal:  Positive for back pain.  Skin:  Positive for rash.  Allergic/Immunologic: Negative.   Neurological:  Positive for numbness.  Psychiatric/Behavioral:  Positive for confusion and dysphoric mood. The patient is  nervous/anxious.       Objective:   Physical Exam  Awake, alert, appropriate, tearful intermittently, NAD Trigger points rhomboids, upper traps, scalenes, levators, and paraspinals throughout  Trigger point pain medial plateau, forearms in extensor muscles; first dorsal interossei and anterior tibialis.  FMS 15/15 points Also has Trochanteric bursitis      Assessment & Plan:    Pt is a 56 yr old female with hx of HTN, moderate persistent asthma; hypothyroidism- latest TSH 1.41, fibromyalgia, Migraines, and BMI 31; Told she has Kidney disease? Thinks due to swelling up so much- -  Latest BUN/Cr 22/0.77.  Has many allergies 30 meds- also has IBS Here for evaluation for pain.  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  Also has trochanteric bursitis.   Needs allergist referral- to look at Eosinophilic esophagitis vs MAST cell syndrome. Will send to [place] [place] [facility] for this.   2.  Myofascial pain syndrome- Will need to get back ASAP for trigger point injections. As long as can find out tolerates lidocaine.   3. Lidocaine patches- over the counter- use one that has ONLY lidocaine in it- try over the counter- if helpful, then will try to get insurance wise/Rx, because allergic to so many things- are 12 hrs on; 12 hrs off- can use up to 3 patches at a time.   4. Low dose naltrexone 2 mg nightly- will do liquid if possible- will do for 1 week, and if effective- then will call in 4 mg nightly. [place], [place]  Will help fibromyalgia but not myofascial pain syndrome.   5.  Theracane- hold pressure- ease into it-  2 minutes minimum - on each spot.  Can get one on [place]. Will never have to replace it.   6. Will increase Zanaflex/Tizanidine- 8 mg 2x/day for muscle spasms- can help high BP as well - #120- 5 refills.   7. No weight lifting- until myofascial pain under better control.   8. Magnesium 250-400 mg- over the  counter- up to 2-3x/day- only side effect loose stools. Is a natural muscle relaxant.  9. Try a knee brace? Cannot tolerate due to pressure-  Might be able to tolerate after Naltrexone or other medicine.   10. Think about Keppra/Trileptal for fibromyalgia. If naltrexone doesn't work.    11. Wait 72 hours with 1 change at a time.   12. Call/my chart  me in 7-10 days to let me know how things working- or let me know  13. Dr Sharion Balloon- The survivor's Handbook to Fibromyalgia and Myofascial pain syndrome.   14. Tennis ball and or rolling pin- same idea as theracane. Hold pressure 2-4 minutes.   15. F/U in 3 months-  Wait list for trigger point injections.   I spent a total of 67   minutes on total care today- >50% coordination of care- due to discussion of dx, prognosis- as detailed above.

## 2022-05-25 ENCOUNTER — Ambulatory Visit (INDEPENDENT_AMBULATORY_CARE_PROVIDER_SITE_OTHER): Payer: Medicare Other | Admitting: Family Medicine

## 2022-05-25 VITALS — BP 166/86 | HR 78 | Temp 97.4°F | Resp 18 | Ht 64.0 in | Wt 179.0 lb

## 2022-05-25 DIAGNOSIS — E876 Hypokalemia: Secondary | ICD-10-CM | POA: Diagnosis not present

## 2022-05-25 DIAGNOSIS — I10 Essential (primary) hypertension: Secondary | ICD-10-CM | POA: Diagnosis not present

## 2022-05-25 MED ORDER — HYDRALAZINE HCL 10 MG PO TABS: 10.0000 mg | ORAL_TABLET | Freq: Three times a day (TID) | ORAL | 0 refills | Status: DC

## 2022-05-25 NOTE — Progress Notes (Signed)
Subjective:  Patient ID: Laura [name], female    DOB: 1965/11/05  Age: 56 y.o. MRN: [id number]  Chief Complaint  Patient presents with   Edema   Hypertension    HPI Swelling:  She comes in for follow-up of BP since stopping the Valsartan.  Her swelling is much improved but her bp has been increasing gradually. Denies shortness of breath. Complaining of nausea, palpitations and constipation.  Current Outpatient Medications on File Prior to Visit  Medication Sig Dispense Refill   albuterol (VENTOLIN HFA) 108 (90 Base) MCG/ACT inhaler Inhale 2 puffs into the lungs every 4 (four) hours as needed for wheezing or shortness of breath. 18 g 3   ALPRAZolam (XANAX) 1 MG tablet Take 1 mg by mouth 4 (four) times daily as needed for anxiety.     Blood Glucose Monitoring Suppl (ACCU-CHEK GUIDE) w/Device KIT 1 each by Does not apply route daily. 1 kit 0   chlorthalidone (HYGROTON) 25 MG tablet Take 1 tablet (25 mg total) by mouth daily. 30 tablet 1   EPINEPHrine 0.3 mg/0.3 mL IJ SOAJ injection Inject 0.3 mLs (0.3 mg total) into the muscle as needed for anaphylaxis. 1 each 2   furosemide (LASIX) 20 MG tablet Take 20 mg by mouth 2 (two) times daily.     glucose blood (ACCU-CHEK GUIDE) test strip 1 each by Other route daily in the afternoon. Use as instructed 100 each 12   hydrOXYzine (VISTARIL) 100 MG capsule Take 1 capsule (100 mg total) by mouth 3 (three) times daily as needed for itching. 30 capsule 0   ipratropium-albuterol (DUONEB) 0.5-2.5 (3) MG/3ML SOLN Take 3 mLs by nebulization every 4 (four) hours as needed. (Patient taking differently: Take 3 mLs by nebulization every 4 (four) hours as needed (asthma).) 360 mL 2   meclizine (ANTIVERT) 25 MG tablet Take 1 tablet (25 mg total) by mouth 3 (three) times daily as needed for dizziness. 30 tablet 0   mometasone (ELOCON) 0.1 % cream Apply 1 Application topically daily. 50 g 2   mupirocin ointment (BACTROBAN) 2 % Apply 1 application topically 2 (two)  times daily. 22 g 0   ondansetron (ZOFRAN) 4 MG tablet Take 1 tablet (4 mg total) by mouth every 8 (eight) hours as needed for nausea or vomiting. 20 tablet 0   SUMAtriptan (IMITREX) 25 MG tablet Take 1 tablet (25 mg total) by mouth every 2 (two) hours as needed for migraine. May repeat in 2 hours if headache persists or recurs. 10 tablet 3   tiZANidine (ZANAFLEX) 4 MG tablet Take 2 tablets (8 mg total) by mouth 2 (two) times daily. For muscle spasms 120 tablet 5   zolpidem (AMBIEN) 10 MG tablet Take 10 mg by mouth at bedtime.     No current facility-administered medications on file prior to visit.   Past Medical History:  Diagnosis Date   Acute GI bleeding 09/01/2019   Anxiety and depression    Bowel obstruction ([place])    Chest pain    a. 01/2016 Ex MV: Hypertensive response. Freq PVCs w/ exercise. nl EF. No ST/T changes. No ischemia.   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 03/2016 Echo: EF 60-65%, no rwma, mild MR, nl LA size, nl RV fxn.   High cholesterol    Kidney stones    Nasal  septal perforation 05/12/2018   Hx of cocaine use   Palpitations    a. 03/2016 Holter: Sinus rhythm, avg HR 83, max 123, min 64. 4 PACs. 10,356 isolated PVCs, one vent couplet, 3842 V bigeminy, 4 beats NSVT->prev on BB - dc 2/2 swelling.   Prediabetes 12/23/2015   Overview:  Hba1c higher but not diabetic. Took metformin to try to lessen   Raynaud disease    Rectocele    Shingles    Sleep apnea    "mild" per pt   Torn rotator cuff    left   Urinary retention with incomplete bladder emptying    Vaginal dryness, menopausal    Vaginal enterocele    Vitamin D deficiency 12/03/2014   Wears hearing aid in both ears    Past Surgical History:  Procedure Laterality Date   5 HOUR [place] STUDY N/A 10/11/2018   Procedure: [place];  Surgeon: Mauri Pole, MD;  [facility];   Service: Endoscopy;  Laterality: N/A;   ABDOMINAL HYSTERECTOMY     ANKLE SURGERY     ran over by mother in car by [place]  09/02/2019   Procedure: BIOPSY;  Surgeon: Rush Landmark Telford Nab., MD;  Location: [place];  Service: Gastroenterology;;   COLONOSCOPY     COLONOSCOPY WITH PROPOFOL N/A 09/02/2019   Procedure: COLONOSCOPY WITH PROPOFOL;  Surgeon: Irving Copas., MD;  Location: [facility];  Service: Gastroenterology;  Laterality: N/A;   COLPORRHAPHY  2015   posterior and enterocele ligation   CYSTOSCOPY  04/11/2017   Procedure: CYSTOSCOPY;  Surgeon: Defrancesco, Alanda Slim, MD;  Location: [facility];  Service: Gynecology;;   ESOPHAGEAL MANOMETRY N/A 10/11/2018   Procedure: ESOPHAGEAL MANOMETRY (EM);  Surgeon: Mauri Pole, MD;  [facility];  Service: Endoscopy;  Laterality: N/A;   ESOPHAGOGASTRODUODENOSCOPY (EGD) WITH PROPOFOL N/A 09/02/2019   Procedure: ESOPHAGOGASTRODUODENOSCOPY (EGD) WITH PROPOFOL;  Surgeon: Rush Landmark Telford Nab., MD;  Location: [place];  Service: Gastroenterology;  Laterality: N/A;   EXTRACORPOREAL SHOCK WAVE LITHOTRIPSY Left 07/31/2020   Procedure: EXTRACORPOREAL SHOCK WAVE LITHOTRIPSY (ESWL);  Surgeon: Billey Co, MD;  Location: [facility];  Service: Urology;  Laterality: Left;   KNEE ARTHROSCOPY WITH MEDIAL MENISECTOMY Right 02/04/2020   Procedure: KNEE ARTHROSCOPY WITH PARTIAL LATERAL AND MEDIAL MENISECTOMY,  PARTIAL SYNOVECTOMY AND CHONDROPLASTY;  Surgeon: Lovell Sheehan, MD;  Location: [facility];  Service: Orthopedics;  Laterality: Right;   LAPAROSCOPIC SALPINGO OOPHERECTOMY Left 04/11/2017   Procedure: LAPAROSCOPIC LEFT SALPINGO OOPHORECTOMY;  Surgeon: Brayton Mars, MD;  Location: [facility];  Service: Gynecology;  Laterality: Left;   LITHOTRIPSY     OOPHORECTOMY     PARTIAL HYSTERECTOMY     [place] IMPEDANCE STUDY N/A 10/11/2018   Procedure: [place] IMPEDANCE STUDY;  Surgeon: Mauri Pole, MD;  [facility];  Service: Endoscopy;  Laterality: N/A;   PVC ABLATION N/A 01/18/2020   Procedure: PVC ABLATION;  Surgeon: Thompson Grayer, MD;  Location: [facility];  Service: Cardiovascular;  Laterality: N/A;   thumb surgery     UPPER GASTROINTESTINAL ENDOSCOPY      Family History  Problem Relation Age of Onset   Stroke Father    Diabetes Father    Breast cancer Mother 76   Diverticulitis Mother    Esophageal cancer Maternal Grandfather    Colon cancer Paternal Grandmother    Suicidality Brother    Ovarian cancer Neg Hx  Stomach cancer Neg Hx    Social History   Socioeconomic History   Marital status: Single    Spouse name: Not on file   Number of children: 4   Years of education: Not on file   Highest education level: Some college, no degree  Occupational History   Occupation: Disabled  Tobacco Use   Smoking status: Former    Types: Cigarettes    Quit date: 04/08/1995    Years since quitting: 27.2   Smokeless tobacco: Never  Vaping Use   Vaping Use: Never used  Substance and Sexual Activity   Alcohol use: Not Currently    Alcohol/week: 1.0 standard drink of alcohol    Types: 1 Glasses of wine per week    Comment: rare; once every 6 months   Drug use: No   Sexual activity: Yes    Partners: Male    Birth control/protection: Surgical  Other Topics Concern   Not on file  Social History Narrative   Separated - 1 son and 1 daughter   Disabled    1 caffeine/day   Past smoker   No EtOH, drugs      08/02/2018      Social Determinants of Health   Financial Resource Strain: Low Risk  (08/05/2021)   Overall Financial Resource Strain (CARDIA)    Difficulty of Paying Living Expenses: Not hard at all  Food Insecurity: No Food Insecurity (08/05/2021)   Hunger Vital Sign    Worried About Running Out of Food in the Last Year: Never true    [place] in the Last Year: Never true  Transportation Needs: No Transportation Needs (08/05/2021)    PRAPARE - Hydrologist (Medical): No    Lack of Transportation (Non-Medical): No  Physical Activity: Sufficiently Active (08/05/2021)   Exercise Vital Sign    Days of Exercise per Week: 6 days    Minutes of Exercise per Session: 60 min  Stress: No Stress Concern Present (08/05/2021)   [place]    Feeling of Stress : Not at all  Social Connections: Somewhat Isolated (10/13/2018)   Social Connection and Isolation Panel [NHANES]    Frequency of Communication with Friends and Family: More than three times a week    Frequency of Social Gatherings with Friends and Family: Once a week    Attends Religious Services: More than 4 times per year    Active Member of [employer] or Organizations: No    Attends Archivist Meetings: Never    Marital Status: Separated    Review of Systems  Constitutional:  Positive for fatigue. Negative for chills and fever.  HENT:  Negative for congestion, rhinorrhea and sore throat.   Respiratory:  Negative for cough and shortness of breath.   Cardiovascular:  Positive for chest pain and palpitations.  Gastrointestinal:  Positive for constipation and nausea. Negative for abdominal pain, diarrhea and vomiting.  Genitourinary:  Negative for dysuria and urgency.  Musculoskeletal:  Positive for arthralgias, back pain and myalgias.  Neurological:  Positive for weakness and headaches. Negative for dizziness and light-headedness.  Psychiatric/Behavioral:  Positive for dysphoric mood. The patient is not nervous/anxious.      Objective:  BP (!) 166/86   Pulse 78   Temp (!) 97.4 F (36.3 C)   Resp 18   Ht 5' 4"  (1.626 m)   Wt 179 lb (81.2 kg)   BMI 30.73 kg/m  06/11/2022   11:45 AM 06/11/2022   11:41 AM 06/09/2022    8:53 AM  BP/Weight  Systolic BP 132 440 102  Diastolic BP 92 725 60  Wt. (Lbs)  181 181  BMI  31.07 kg/m2 31.07 kg/m2    Physical  Exam Vitals reviewed.  Constitutional:      Appearance: Normal appearance. She is normal weight.  Neck:     Vascular: No carotid bruit.  Cardiovascular:     Rate and Rhythm: Normal rate and regular rhythm.     Heart sounds: Normal heart sounds.  Pulmonary:     Effort: Pulmonary effort is normal. No respiratory distress.     Breath sounds: Normal breath sounds.  Abdominal:     General: Abdomen is flat. Bowel sounds are normal.     Palpations: Abdomen is soft.     Tenderness: There is no abdominal tenderness.  Neurological:     Mental Status: She is alert and oriented to person, place, and time.  Psychiatric:        Mood and Affect: Mood normal.        Behavior: Behavior normal.     Diabetic Foot Exam - Simple   No data filed      Lab Results  Component Value Date   WBC 4.8 06/09/2022   HGB 13.0 06/09/2022   HCT 41.0 06/09/2022   PLT 330 06/09/2022   GLUCOSE 104 (H) 06/09/2022   CHOL 330 (H) 04/19/2022   TRIG 147 04/19/2022   HDL 67 04/19/2022   LDLDIRECT 222 (H) 04/26/2019   LDLCALC 236 (H) 04/19/2022   ALT 31 06/09/2022   AST 28 06/09/2022   NA 137 06/09/2022   K 3.7 06/09/2022   CL 97 06/09/2022   CREATININE 0.80 06/09/2022   BUN 29 (H) 06/09/2022   CO2 26 06/09/2022   TSH 1.410 05/11/2022   INR 0.9 08/11/2017   HGBA1C 5.8 (H) 04/19/2022      Assessment & Plan:   Problem List Items Addressed This Visit       Cardiovascular and Mediastinum   Essential hypertension, benign - Primary    Start hydralazine 10 mg three times a day. Stop quinapril. Continue chlorthalidone 25 mg daily.       Relevant Orders   Comprehensive metabolic panel (Completed)     Other   Hypokalemia    Check cmp.      .  Orders Placed This Encounter  Procedures   Comprehensive metabolic panel     Follow-up: Return in about 2 weeks (around 06/08/2022).  An After Visit Summary was printed and given to the patient.  Rochel Brome, MD [facility] [phone]

## 2022-05-25 NOTE — Patient Instructions (Signed)
Start hydralazine 10 mg three times a day  Continue chlorthalidone 25 mg daily.

## 2022-05-26 ENCOUNTER — Telehealth: Payer: Self-pay

## 2022-05-26 LAB — COMPREHENSIVE METABOLIC PANEL
ALT: 17 IU/L (ref 0–32)
AST: 19 IU/L (ref 0–40)
Albumin/Globulin Ratio: 1.9 (ref 1.2–2.2)
Albumin: 4.3 g/dL (ref 3.8–4.9)
Alkaline Phosphatase: 66 IU/L (ref 44–121)
BUN/Creatinine Ratio: 27 — ABNORMAL HIGH (ref 9–23)
BUN: 24 mg/dL (ref 6–24)
Bilirubin Total: 0.2 mg/dL (ref 0.0–1.2)
CO2: 29 mmol/L (ref 20–29)
Calcium: 9.7 mg/dL (ref 8.7–10.2)
Chloride: 95 mmol/L — ABNORMAL LOW (ref 96–106)
Creatinine, Ser: 0.89 mg/dL (ref 0.57–1.00)
Globulin, Total: 2.3 g/dL (ref 1.5–4.5)
Glucose: 99 mg/dL (ref 70–99)
Potassium: 3 mmol/L — ABNORMAL LOW (ref 3.5–5.2)
Sodium: 137 mmol/L (ref 134–144)
Total Protein: 6.6 g/dL (ref 6.0–8.5)
eGFR: 76 mL/min/{1.73_m2} (ref >59)

## 2022-05-26 NOTE — Progress Notes (Signed)
Care Gap(s) Not Met that Need to be Addressed:   Controlling High Blood Pressure   Action Taken: Called pt today to have her take her BP but she was at work and could not check it. Pt will check it tonight and call and leave her reading on my VM Called on 727/23-2nd attempt lvm Called on 05/28/22, Pt took BP while on the phone 133/81   Follow Up: Pt is following with Dr. Tobie Poet next week

## 2022-05-27 ENCOUNTER — Encounter: Payer: Self-pay | Admitting: Family Medicine

## 2022-05-29 NOTE — Progress Notes (Signed)
See note from Adron Bene, [facility].

## 2022-05-30 ENCOUNTER — Encounter: Payer: Self-pay | Admitting: Family Medicine

## 2022-05-30 DIAGNOSIS — E876 Hypokalemia: Secondary | ICD-10-CM | POA: Diagnosis not present

## 2022-05-30 DIAGNOSIS — R0789 Other chest pain: Secondary | ICD-10-CM | POA: Diagnosis not present

## 2022-05-30 DIAGNOSIS — I10 Essential (primary) hypertension: Secondary | ICD-10-CM | POA: Diagnosis not present

## 2022-05-30 DIAGNOSIS — J45909 Unspecified asthma, uncomplicated: Secondary | ICD-10-CM | POA: Diagnosis not present

## 2022-05-30 DIAGNOSIS — R079 Chest pain, unspecified: Secondary | ICD-10-CM | POA: Diagnosis not present

## 2022-05-30 DIAGNOSIS — E039 Hypothyroidism, unspecified: Secondary | ICD-10-CM | POA: Diagnosis not present

## 2022-05-30 DIAGNOSIS — E119 Type 2 diabetes mellitus without complications: Secondary | ICD-10-CM | POA: Diagnosis not present

## 2022-05-30 DIAGNOSIS — R0602 Shortness of breath: Secondary | ICD-10-CM | POA: Diagnosis not present

## 2022-05-30 DIAGNOSIS — R110 Nausea: Secondary | ICD-10-CM | POA: Diagnosis not present

## 2022-05-30 DIAGNOSIS — Z79899 Other long term (current) drug therapy: Secondary | ICD-10-CM | POA: Diagnosis not present

## 2022-05-30 DIAGNOSIS — M797 Fibromyalgia: Secondary | ICD-10-CM | POA: Diagnosis not present

## 2022-05-31 DIAGNOSIS — I361 Nonrheumatic tricuspid (valve) insufficiency: Secondary | ICD-10-CM | POA: Diagnosis not present

## 2022-05-31 DIAGNOSIS — R0789 Other chest pain: Secondary | ICD-10-CM | POA: Diagnosis not present

## 2022-05-31 DIAGNOSIS — M797 Fibromyalgia: Secondary | ICD-10-CM | POA: Diagnosis not present

## 2022-06-02 ENCOUNTER — Ambulatory Visit (INDEPENDENT_AMBULATORY_CARE_PROVIDER_SITE_OTHER): Payer: Medicare Other | Admitting: Family Medicine

## 2022-06-02 ENCOUNTER — Other Ambulatory Visit: Payer: Self-pay

## 2022-06-02 VITALS — BP 128/78 | HR 72 | Temp 97.4°F | Resp 16 | Ht 64.0 in | Wt 179.0 lb

## 2022-06-02 DIAGNOSIS — M797 Fibromyalgia: Secondary | ICD-10-CM | POA: Diagnosis not present

## 2022-06-02 DIAGNOSIS — E876 Hypokalemia: Secondary | ICD-10-CM

## 2022-06-02 DIAGNOSIS — I10 Essential (primary) hypertension: Secondary | ICD-10-CM

## 2022-06-02 LAB — CBC WITH DIFFERENTIAL/PLATELET
Basophils Absolute: 0 10*3/uL (ref 0.0–0.2)
Basos: 1 %
EOS (ABSOLUTE): 0.1 10*3/uL (ref 0.0–0.4)
Eos: 3 %
Hematocrit: 41.9 % (ref 34.0–46.6)
Hemoglobin: 14 g/dL (ref 11.1–15.9)
Lymphocytes Absolute: 1.9 10*3/uL (ref 0.7–3.1)
Lymphs: 36 %
MCH: 28.6 pg (ref 26.6–33.0)
MCHC: 33.4 g/dL (ref 31.5–35.7)
MCV: 86 fL (ref 79–97)
Monocytes Absolute: 0.6 10*3/uL (ref 0.1–0.9)
Monocytes: 11 %
Neutrophils Absolute: 2.6 10*3/uL (ref 1.4–7.0)
Neutrophils: 49 %
Platelets: 369 10*3/uL (ref 150–450)
RBC: 4.9 x10E6/uL (ref 3.77–5.28)
RDW: 14.6 % (ref 11.7–15.4)
WBC: 5.3 10*3/uL (ref 3.4–10.8)

## 2022-06-02 LAB — COMPREHENSIVE METABOLIC PANEL
ALT: 23 IU/L (ref 0–32)
AST: 23 IU/L (ref 0–40)
Albumin/Globulin Ratio: 1.9 (ref 1.2–2.2)
Albumin: 4.6 g/dL (ref 3.8–4.9)
Alkaline Phosphatase: 70 IU/L (ref 44–121)
BUN/Creatinine Ratio: 29 — ABNORMAL HIGH (ref 9–23)
BUN: 22 mg/dL (ref 6–24)
Bilirubin Total: 0.2 mg/dL (ref 0.0–1.2)
CO2: 35 mmol/L — ABNORMAL HIGH (ref 20–29)
Calcium: 10.3 mg/dL — ABNORMAL HIGH (ref 8.7–10.2)
Chloride: 97 mmol/L (ref 96–106)
Creatinine, Ser: 0.76 mg/dL (ref 0.57–1.00)
Globulin, Total: 2.4 g/dL (ref 1.5–4.5)
Glucose: 107 mg/dL — ABNORMAL HIGH (ref 70–99)
Potassium: 3.8 mmol/L (ref 3.5–5.2)
Sodium: 137 mmol/L (ref 134–144)
Total Protein: 7 g/dL (ref 6.0–8.5)
eGFR: 92 mL/min/{1.73_m2} (ref >59)

## 2022-06-02 MED ORDER — POTASSIUM CHLORIDE CRYS ER 20 MEQ PO TBCR: 40.0000 meq | EXTENDED_RELEASE_TABLET | Freq: Two times a day (BID) | ORAL | 0 refills | Status: DC

## 2022-06-02 NOTE — Progress Notes (Signed)
Subjective:  Patient ID: Laura [name], female    DOB: 1966-10-14  Age: 56 y.o. MRN: [id number]  Chief Complaint  Patient presents with   Hypertension   Hyperkalemia    HPI Laura [name] comes in for hospital follow-up.  She was admitted to [facility] May 31, 2022 with right-sided chest pain with radiating pain in right shoulder and right neck which had been going on for several days.  Troponin x 3 normal. CMP normal except potassium low. Last week potassium was 3.4 last week and increased potassium. Upped to 40 meq once daily.  Echocardiogram report showed: The diastolic filling pattern indicates impaired relaxation. No regional wall motion abnormalities were noted.There is mild aortic valve sclerosis, There is mild-to-moderate aortic regurgitation, there is a trace to mild mitral regurgitation, Mild tricuspid regurgitation present.  Patient held hydralazine because she was having tachycardia and globus sensation. She also was found to have low potassium and this was replaced.  Swelling has improved with chlorthalidone 25 mg daily and lasix.20 md twice daily  Taking potassium 40 meq once daily.  Patient has had significant issues with low potassium.   Took extra quinapril on Tuesday because 168/113. BP is good today, but will likely gradually increase over the next week.  Current Outpatient Medications on File Prior to Visit  Medication Sig Dispense Refill   albuterol (VENTOLIN HFA) 108 (90 Base) MCG/ACT inhaler Inhale 2 puffs into the lungs every 4 (four) hours as needed for wheezing or shortness of breath. 18 g 3   ALPRAZolam (XANAX) 1 MG tablet Take 1 mg by mouth 4 (four) times daily as needed for anxiety.     Blood Glucose Monitoring Suppl (ACCU-CHEK GUIDE) w/Device KIT 1 each by Does not apply route daily. 1 kit 0   chlorthalidone (HYGROTON) 25 MG tablet Take 1 tablet (25 mg total) by mouth daily. 30 tablet 1   EPINEPHrine 0.3 mg/0.3 mL IJ SOAJ injection Inject 0.3 mLs (0.3 mg  total) into the muscle as needed for anaphylaxis. 1 each 2   furosemide (LASIX) 20 MG tablet Take 20 mg by mouth 2 (two) times daily.     glucose blood (ACCU-CHEK GUIDE) test strip 1 each by Other route daily in the afternoon. Use as instructed 100 each 12   hydrOXYzine (VISTARIL) 100 MG capsule Take 1 capsule (100 mg total) by mouth 3 (three) times daily as needed for itching. 30 capsule 0   ipratropium-albuterol (DUONEB) 0.5-2.5 (3) MG/3ML SOLN Take 3 mLs by nebulization every 4 (four) hours as needed. (Patient taking differently: Take 3 mLs by nebulization every 4 (four) hours as needed (asthma).) 360 mL 2   meclizine (ANTIVERT) 25 MG tablet Take 1 tablet (25 mg total) by mouth 3 (three) times daily as needed for dizziness. 30 tablet 0   mometasone (ELOCON) 0.1 % cream Apply 1 Application topically daily. 50 g 2   mupirocin ointment (BACTROBAN) 2 % Apply 1 application topically 2 (two) times daily. 22 g 0   ondansetron (ZOFRAN) 4 MG tablet Take 1 tablet (4 mg total) by mouth every 8 (eight) hours as needed for nausea or vomiting. 20 tablet 0   SUMAtriptan (IMITREX) 25 MG tablet Take 1 tablet (25 mg total) by mouth every 2 (two) hours as needed for migraine. May repeat in 2 hours if headache persists or recurs. 10 tablet 3   tiZANidine (ZANAFLEX) 4 MG tablet Take 2 tablets (8 mg total) by mouth 2 (two) times daily. For muscle spasms 120 tablet  5   zolpidem (AMBIEN) 10 MG tablet Take 10 mg by mouth at bedtime.     No current facility-administered medications on file prior to visit.   Past Medical History:  Diagnosis Date   Acute GI bleeding 09/01/2019   Anxiety and depression    Bowel obstruction ([place])    Chest pain    a. 01/2016 Ex MV: Hypertensive response. Freq PVCs w/ exercise. nl EF. No ST/T changes. No ischemia.   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 03/2016 Echo: EF 60-65%, no  rwma, mild MR, nl LA size, nl RV fxn.   High cholesterol    Kidney stones    Nasal septal perforation 05/12/2018   Hx of cocaine use   Palpitations    a. 03/2016 Holter: Sinus rhythm, avg HR 83, max 123, min 64. 4 PACs. 10,356 isolated PVCs, one vent couplet, 3842 V bigeminy, 4 beats NSVT->prev on BB - dc 2/2 swelling.   Prediabetes 12/23/2015   Overview:  Hba1c higher but not diabetic. Took metformin to try to lessen   Raynaud disease    Rectocele    Shingles    Sleep apnea    "mild" per pt   Torn rotator cuff    left   Urinary retention with incomplete bladder emptying    Vaginal dryness, menopausal    Vaginal enterocele    Vitamin D deficiency 12/03/2014   Wears hearing aid in both ears    Past Surgical History:  Procedure Laterality Date   67 HOUR [place] STUDY N/A 10/11/2018   Procedure: [place];  Surgeon: Mauri Pole, MD;  Location: [facility];  Service: Endoscopy;  Laterality: N/A;   ABDOMINAL HYSTERECTOMY     ANKLE SURGERY     ran over by mother in car by [place]  09/02/2019   Procedure: BIOPSY;  Surgeon: Rush Landmark Telford Nab., MD;  Location: [place];  Service: Gastroenterology;;   COLONOSCOPY     COLONOSCOPY WITH PROPOFOL N/A 09/02/2019   Procedure: COLONOSCOPY WITH PROPOFOL;  Surgeon: Irving Copas., MD;  Location: [place];  Service: Gastroenterology;  Laterality: N/A;   COLPORRHAPHY  2015   posterior and enterocele ligation   CYSTOSCOPY  04/11/2017   Procedure: CYSTOSCOPY;  Surgeon: Defrancesco, Alanda Slim, MD;  Location: [facility];  Service: Gynecology;;   ESOPHAGEAL MANOMETRY N/A 10/11/2018   Procedure: ESOPHAGEAL MANOMETRY (EM);  Surgeon: Mauri Pole, MD;  Location: [facility];  Service: Endoscopy;  Laterality: N/A;   ESOPHAGOGASTRODUODENOSCOPY (EGD) WITH PROPOFOL N/A 09/02/2019   Procedure: ESOPHAGOGASTRODUODENOSCOPY (EGD) WITH PROPOFOL;  Surgeon: Rush Landmark Telford Nab., MD;  Location: [place];  Service: Gastroenterology;  Laterality: N/A;   EXTRACORPOREAL SHOCK WAVE LITHOTRIPSY Left 07/31/2020   Procedure: EXTRACORPOREAL SHOCK WAVE LITHOTRIPSY (ESWL);  Surgeon: Billey Co, MD;  Location: [facility];  Service: Urology;  Laterality: Left;   KNEE ARTHROSCOPY WITH MEDIAL MENISECTOMY Right 02/04/2020   Procedure: KNEE ARTHROSCOPY WITH PARTIAL LATERAL AND MEDIAL MENISECTOMY,  PARTIAL SYNOVECTOMY AND CHONDROPLASTY;  Surgeon: Lovell Sheehan, MD;  Location: [place];  Service: Orthopedics;  Laterality: Right;   LAPAROSCOPIC SALPINGO OOPHERECTOMY Left 04/11/2017   Procedure: LAPAROSCOPIC LEFT SALPINGO OOPHORECTOMY;  Surgeon: Brayton Mars, MD;  Location: [facility];  Service: Gynecology;  Laterality: Left;   LITHOTRIPSY  [place] IMPEDANCE STUDY N/A 10/11/2018   Procedure: [place] IMPEDANCE STUDY;  Surgeon: Mauri Pole, MD;  Location: [facility];  Service: Endoscopy;  Laterality: N/A;   PVC ABLATION N/A 01/18/2020   Procedure: PVC ABLATION;  Surgeon: Thompson Grayer, MD;  Location: [place] [facility];  Service: Cardiovascular;  Laterality: N/A;   thumb surgery     UPPER GASTROINTESTINAL ENDOSCOPY      Family History  Problem Relation Age of Onset   Stroke Father    Diabetes Father    Breast cancer Mother 48   Diverticulitis Mother    Esophageal cancer Maternal Grandfather    Colon cancer Paternal Grandmother    Suicidality Brother    Ovarian cancer Neg Hx    Stomach cancer Neg Hx    Social History   Socioeconomic History   Marital status: Single    Spouse name: Not on file   Number of children: 4   Years of education: Not on file   Highest education level: Some college, no degree  Occupational History   Occupation: Disabled  Tobacco Use   Smoking status: Former    Types: Cigarettes    Quit date: 04/08/1995    Years since quitting: 27.2   Smokeless tobacco: Never  Vaping Use   Vaping Use: Never used   Substance and Sexual Activity   Alcohol use: Not Currently    Alcohol/week: 1.0 standard drink of alcohol    Types: 1 Glasses of wine per week    Comment: rare; once every 6 months   Drug use: No   Sexual activity: Yes    Partners: Male    Birth control/protection: Surgical  Other Topics Concern   Not on file  Social History Narrative   Separated - 1 son and 1 daughter   Disabled    1 caffeine/day   Past smoker   No EtOH, drugs      08/02/2018      Social Determinants of Health   Financial Resource Strain: Low Risk  (08/05/2021)   Overall Financial Resource Strain (CARDIA)    Difficulty of Paying Living Expenses: Not hard at all  Food Insecurity: No Food Insecurity (08/05/2021)   Hunger Vital Sign    Worried About Running Out of Food in the Last Year: Never true    [place] in the Last Year: Never true  Transportation Needs: No Transportation Needs (08/05/2021)   PRAPARE - Hydrologist (Medical): No    Lack of Transportation (Non-Medical): No  Physical Activity: Sufficiently Active (08/05/2021)   Exercise Vital Sign    Days of Exercise per Week: 6 days    Minutes of Exercise per Session: 60 min  Stress: No Stress Concern Present (08/05/2021)   [place]    Feeling of Stress : Not at all  Social Connections: Somewhat Isolated (10/13/2018)   Social Connection and Isolation Panel [NHANES]    Frequency of Communication with Friends and Family: More than three times a week    Frequency of Social Gatherings with Friends and Family: Once a week    Attends Religious Services: More than 4 times per year    Active Member of [employer] or Organizations: No    Attends Archivist Meetings: Never    Marital Status: Separated    Review of Systems  Constitutional:  Positive for fatigue. Negative for  chills and fever.  HENT:  Negative for congestion, rhinorrhea and sore throat.    Respiratory:  Positive for shortness of breath. Negative for cough.   Cardiovascular:  Negative for chest pain.  Gastrointestinal:  Positive for abdominal pain. Negative for constipation, diarrhea, nausea and vomiting.  Genitourinary:  Negative for dysuria and urgency.  Musculoskeletal:  Positive for myalgias. Negative for back pain.  Neurological:  Negative for dizziness, weakness, light-headedness and headaches.  Psychiatric/Behavioral:  Positive for dysphoric mood. The patient is not nervous/anxious.      Objective:  BP 128/78   Pulse 72   Temp (!) 97.4 F (36.3 C)   Resp 16   Ht 5' 4"  (1.626 m)   Wt 179 lb (81.2 kg)   BMI 30.73 kg/m      06/11/2022   11:45 AM 06/11/2022   11:41 AM 06/09/2022    8:53 AM  BP/Weight  Systolic BP 884 166 063  Diastolic BP 92 016 60  Wt. (Lbs)  181 181  BMI  31.07 kg/m2 31.07 kg/m2    Physical Exam Vitals reviewed.  Constitutional:      Appearance: Normal appearance. She is normal weight.  Neck:     Vascular: No carotid bruit.  Cardiovascular:     Rate and Rhythm: Normal rate and regular rhythm.     Heart sounds: Normal heart sounds.  Pulmonary:     Effort: Pulmonary effort is normal. No respiratory distress.     Breath sounds: Normal breath sounds.  Abdominal:     General: Abdomen is flat. Bowel sounds are normal.     Palpations: Abdomen is soft.     Tenderness: There is no abdominal tenderness.  Musculoskeletal:        General: Tenderness (Fibromyalgia trigger points.) present.     Right lower leg: Edema present.     Left lower leg: Edema present.  Neurological:     Mental Status: She is alert and oriented to person, place, and time.  Psychiatric:        Mood and Affect: Mood normal.        Behavior: Behavior normal.     Diabetic Foot Exam - Simple   No data filed      Lab Results  Component Value Date   WBC 4.8 06/09/2022   HGB 13.0 06/09/2022   HCT 41.0 06/09/2022   PLT 330 06/09/2022   GLUCOSE 104 (H)  06/09/2022   CHOL 330 (H) 04/19/2022   TRIG 147 04/19/2022   HDL 67 04/19/2022   LDLDIRECT 222 (H) 04/26/2019   LDLCALC 236 (H) 04/19/2022   ALT 31 06/09/2022   AST 28 06/09/2022   NA 137 06/09/2022   K 3.7 06/09/2022   CL 97 06/09/2022   CREATININE 0.80 06/09/2022   BUN 29 (H) 06/09/2022   CO2 26 06/09/2022   TSH 1.410 05/11/2022   INR 0.9 08/11/2017   HGBA1C 5.8 (H) 04/19/2022      Assessment & Plan:   Problem List Items Addressed This Visit       Cardiovascular and Mediastinum   Essential hypertension    Continue on Quinapril and check blood pressure at home. Check labs.      Relevant Orders   CBC with Differential/Platelet (Completed)     Other   Fibromyalgia (Chronic)    Not at goal.  Worsens with hypokalemia.       Hypokalemia - Primary    Check labs      Relevant Orders   Comprehensive  metabolic panel (Completed)  .  No orders of the defined types were placed in this encounter.   Orders Placed This Encounter  Procedures   CBC with Differential/Platelet   Comprehensive metabolic panel    I,Marla Scherrie Gerlach, acting as a scribe for Rochel Brome, MD.,have documented all relevant documentation on the behalf of Rochel Brome, MD,as directed by  Rochel Brome, MD while in the presence of Rochel Brome, MD.   Follow-up: Return in about 2 weeks (around 06/16/2022).  An After Visit Summary was printed and given to the patient.  Rochel Brome, MD [facility] [phone]

## 2022-06-03 NOTE — Progress Notes (Signed)
Blood count normal.  Liver function normal.  Kidney function normal.  Potassium 3.8. recommend increase potassium chloride 20 meq 2 twice a day.  Patient was notified.

## 2022-06-06 NOTE — Assessment & Plan Note (Signed)
Check labs 

## 2022-06-08 ENCOUNTER — Ambulatory Visit: Payer: Medicare Other | Admitting: Family Medicine

## 2022-06-09 ENCOUNTER — Ambulatory Visit (INDEPENDENT_AMBULATORY_CARE_PROVIDER_SITE_OTHER): Payer: Medicare Other | Admitting: Family Medicine

## 2022-06-09 ENCOUNTER — Encounter: Payer: Self-pay | Admitting: Family Medicine

## 2022-06-09 VITALS — BP 110/60 | HR 76 | Temp 97.3°F | Resp 14 | Wt 181.0 lb

## 2022-06-09 DIAGNOSIS — I10 Essential (primary) hypertension: Secondary | ICD-10-CM

## 2022-06-09 DIAGNOSIS — E876 Hypokalemia: Secondary | ICD-10-CM | POA: Diagnosis not present

## 2022-06-09 DIAGNOSIS — M797 Fibromyalgia: Secondary | ICD-10-CM | POA: Diagnosis not present

## 2022-06-09 DIAGNOSIS — T7840XS Allergy, unspecified, sequela: Secondary | ICD-10-CM | POA: Diagnosis not present

## 2022-06-09 MED ORDER — CAPTOPRIL 12.5 MG PO TABS: 6.2500 mg | ORAL_TABLET | Freq: Three times a day (TID) | ORAL | 0 refills | Status: DC

## 2022-06-09 MED ORDER — METHYLPREDNISOLONE 4 MG PO TBPK: ORAL_TABLET | ORAL | 0 refills | Status: DC

## 2022-06-09 NOTE — Assessment & Plan Note (Signed)
Start on captopril 12.5 mg one half pill three times a day.  Medrol Dose pack sent.

## 2022-06-09 NOTE — Assessment & Plan Note (Signed)
Start on captopril 12.5 mg one half pill three times a day.

## 2022-06-09 NOTE — Patient Instructions (Addendum)
Start on captopril 12.5 mg one half pill three times a day.  Medrol Dose pack sent.

## 2022-06-09 NOTE — Assessment & Plan Note (Signed)
Check cmp 

## 2022-06-09 NOTE — Progress Notes (Signed)
Acute Office Visit  Subjective:    Patient ID: Laura [name], female    DOB: Jan 25, 1966, 56 y.o.   MRN: [id number]  Chief Complaint  Patient presents with   Hypertension    HPI: Patient is in today for recheck of the chemistry panel due to hypokalemia.  Increased her potassium last week.  In addition I started her on hydralazine however she had to discontinue this due to tachycardia.  Her heart rate went to 110.  She took some quinapril she had.  This is the only medicine that has helped her blood pressure in the past without having any side effects.  Unfortunately it is on backorder or possibly discontinued.  Did try her on lisinopril but she had swelling.  She is also complaining having a fibromyalgia flare.  The patient is incredibly sensitive to medications.  She is seeing Dr. Alice Rieger for her chronic pain issues.  Dr. Alice Rieger is concerned she might have mast cell syndrome and is getting her set up to see an allergy and immunologist.  Patient is requesting a work note because she is hurting all over and having severe fatigue.  Past Medical History:  Diagnosis Date   Acute GI bleeding 09/01/2019   Anxiety and depression    Bowel obstruction ([place])    Chest pain    a. 01/2016 Ex MV: Hypertensive response. Freq PVCs w/ exercise. nl EF. No ST/T changes. No ischemia.   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 03/2016 Echo: EF 60-65%, no rwma, mild MR, nl LA size, nl RV fxn.   High cholesterol    Kidney stones    Nasal septal perforation 05/12/2018   Hx of cocaine use   Palpitations    a. 03/2016 Holter: Sinus rhythm, avg HR 83, max 123, min 64. 4 PACs. 10,356 isolated PVCs, one vent couplet, 3842 V bigeminy, 4 beats NSVT->prev on BB - dc 2/2 swelling.   Prediabetes 12/23/2015   Overview:  Hba1c higher but not diabetic. Took metformin to try to lessen   Raynaud disease    Rectocele     Shingles    Sleep apnea    "mild" per pt   Torn rotator cuff    left   Urinary retention with incomplete bladder emptying    Vaginal dryness, menopausal    Vaginal enterocele    Vitamin D deficiency 12/03/2014   Wears hearing aid in both ears     Past Surgical History:  Procedure Laterality Date   16 HOUR [place] STUDY N/A 10/11/2018   Procedure: [place];  Surgeon: Mauri Pole, MD;  Location: [facility];  Service: Endoscopy;  Laterality: N/A;   ABDOMINAL HYSTERECTOMY     ANKLE SURGERY     ran over by mother in car by [place]  09/02/2019   Procedure: BIOPSY;  Surgeon: Rush Landmark Telford Nab., MD;  Location: [facility];  Service: Gastroenterology;;   COLONOSCOPY     COLONOSCOPY WITH PROPOFOL N/A 09/02/2019   Procedure: COLONOSCOPY WITH PROPOFOL;  Surgeon: Irving Copas., MD;  Location: [facility];  Service: Gastroenterology;  Laterality: N/A;   COLPORRHAPHY  2015   posterior and enterocele ligation   CYSTOSCOPY  04/11/2017   Procedure: CYSTOSCOPY;  Surgeon: Defrancesco, Alanda Slim, MD;  Location: [facility];  Service: Gynecology;;   ESOPHAGEAL MANOMETRY N/A 10/11/2018   Procedure: ESOPHAGEAL MANOMETRY (EM);  Surgeon: Mauri Pole, MD;  [facility]: [facility];  Service: Endoscopy;  Laterality: N/A;   ESOPHAGOGASTRODUODENOSCOPY (EGD) WITH PROPOFOL N/A 09/02/2019   Procedure: ESOPHAGOGASTRODUODENOSCOPY (EGD) WITH PROPOFOL;  Surgeon: Rush Landmark Telford Nab., MD;  Location: [facility];  Service: Gastroenterology;  Laterality: N/A;   EXTRACORPOREAL SHOCK WAVE LITHOTRIPSY Left 07/31/2020   Procedure: EXTRACORPOREAL SHOCK WAVE LITHOTRIPSY (ESWL);  Surgeon: Billey Co, MD;  Location: [facility];  Service: Urology;  Laterality: Left;   KNEE ARTHROSCOPY WITH MEDIAL MENISECTOMY Right 02/04/2020   Procedure: KNEE ARTHROSCOPY WITH PARTIAL LATERAL AND MEDIAL MENISECTOMY,  PARTIAL SYNOVECTOMY AND CHONDROPLASTY;  Surgeon: Lovell Sheehan,  MD;  Location: [place] [facility];  Service: Orthopedics;  Laterality: Right;   LAPAROSCOPIC SALPINGO OOPHERECTOMY Left 04/11/2017   Procedure: LAPAROSCOPIC LEFT SALPINGO OOPHORECTOMY;  Surgeon: Brayton Mars, MD;  Location: [facility];  Service: Gynecology;  Laterality: Left;   LITHOTRIPSY     OOPHORECTOMY     PARTIAL HYSTERECTOMY     [place] IMPEDANCE STUDY N/A 10/11/2018   Procedure: [place] IMPEDANCE STUDY;  Surgeon: Mauri Pole, MD;  Location: [facility];  Service: Endoscopy;  Laterality: N/A;   PVC ABLATION N/A 01/18/2020   Procedure: PVC ABLATION;  Surgeon: Thompson Grayer, MD;  Location: [facility];  Service: Cardiovascular;  Laterality: N/A;   thumb surgery     UPPER GASTROINTESTINAL ENDOSCOPY      Family History  Problem Relation Age of Onset   Stroke Father    Diabetes Father    Breast cancer Mother 34   Diverticulitis Mother    Esophageal cancer Maternal Grandfather    Colon cancer Paternal Grandmother    Suicidality Brother    Ovarian cancer Neg Hx    Stomach cancer Neg Hx     Social History   Socioeconomic History   Marital status: Single    Spouse name: Not on file   Number of children: 4   Years of education: Not on file   Highest education level: Some college, no degree  Occupational History   Occupation: Disabled  Tobacco Use   Smoking status: Former    Types: Cigarettes    Quit date: 04/08/1995    Years since quitting: 27.1   Smokeless tobacco: Never  Vaping Use   Vaping Use: Never used  Substance and Sexual Activity   Alcohol use: Not Currently    Alcohol/week: 1.0 standard drink of alcohol    Types: 1 Glasses of wine per week    Comment: rare; once every 6 months   Drug use: No   Sexual activity: Yes    Partners: Male    Birth control/protection: Surgical  Other Topics Concern   Not on file  Social History Narrative   Separated - 1 son and 1 daughter   Disabled    1 caffeine/day   Past smoker   No EtOH, drugs      08/02/2018       Social Determinants of Health   Financial Resource Strain: Low Risk  (08/05/2021)   Overall Financial Resource Strain (CARDIA)    Difficulty of Paying Living Expenses: Not hard at all  Food Insecurity: No Food Insecurity (08/05/2021)   Hunger Vital Sign    Worried About Running Out of Food in the Last Year: Never true    Ran Out of Food in the Last Year: Never true  Transportation Needs: No Transportation Needs (08/05/2021)  PRAPARE - Hydrologist (Medical): No    Lack of Transportation (Non-Medical): No  Physical Activity: Sufficiently Active (08/05/2021)   Exercise Vital Sign    Days of Exercise per Week: 6 days    Minutes of Exercise per Session: 60 min  Stress: No Stress Concern Present (08/05/2021)   [place]    Feeling of Stress : Not at all  Social Connections: Somewhat Isolated (10/13/2018)   Social Connection and Isolation Panel [NHANES]    Frequency of Communication with Friends and Family: More than three times a week    Frequency of Social Gatherings with Friends and Family: Once a week    Attends Religious Services: More than 4 times per year    Active Member of [employer] or Organizations: No    Attends Archivist Meetings: Never    Marital Status: Separated  Intimate Partner Violence: Not At Risk (08/05/2021)   Humiliation, Afraid, Rape, and Kick questionnaire    Fear of Current or Ex-Partner: No    Emotionally Abused: No    Physically Abused: No    Sexually Abused: No    Outpatient Medications Prior to Visit  Medication Sig Dispense Refill   albuterol (VENTOLIN HFA) 108 (90 Base) MCG/ACT inhaler Inhale 2 puffs into the lungs every 4 (four) hours as needed for wheezing or shortness of breath. 18 g 3   ALPRAZolam (XANAX) 1 MG tablet Take 1 mg by mouth 4 (four) times daily as needed for anxiety.     Blood Glucose Monitoring Suppl (ACCU-CHEK GUIDE) w/Device KIT 1  each by Does not apply route daily. 1 kit 0   chlorthalidone (HYGROTON) 25 MG tablet Take 1 tablet (25 mg total) by mouth daily. 30 tablet 1   EPINEPHrine 0.3 mg/0.3 mL IJ SOAJ injection Inject 0.3 mLs (0.3 mg total) into the muscle as needed for anaphylaxis. 1 each 2   furosemide (LASIX) 20 MG tablet Take 20 mg by mouth 2 (two) times daily.     glucose blood (ACCU-CHEK GUIDE) test strip 1 each by Other route daily in the afternoon. Use as instructed 100 each 12   hydrALAZINE (APRESOLINE) 10 MG tablet Take 1 tablet (10 mg total) by mouth 3 (three) times daily. 90 tablet 0   hydrOXYzine (VISTARIL) 100 MG capsule Take 1 capsule (100 mg total) by mouth 3 (three) times daily as needed for itching. 30 capsule 0   ipratropium-albuterol (DUONEB) 0.5-2.5 (3) MG/3ML SOLN Take 3 mLs by nebulization every 4 (four) hours as needed. (Patient taking differently: Take 3 mLs by nebulization every 4 (four) hours as needed (asthma).) 360 mL 2   levothyroxine (SYNTHROID) 112 MCG tablet TAKE 1 TABLET BY MOUTH  DAILY 90 tablet 1   meclizine (ANTIVERT) 25 MG tablet Take 1 tablet (25 mg total) by mouth 3 (three) times daily as needed for dizziness. 30 tablet 0   mometasone (ELOCON) 0.1 % cream Apply 1 Application topically daily. 50 g 2   mupirocin ointment (BACTROBAN) 2 % Apply 1 application topically 2 (two) times daily. 22 g 0   ondansetron (ZOFRAN) 4 MG tablet Take 1 tablet (4 mg total) by mouth every 8 (eight) hours as needed for nausea or vomiting. 20 tablet 0   potassium chloride SA (KLOR-CON M) 20 MEQ tablet Take 2 tablets (40 mEq total) by mouth 2 (two) times daily. 360 tablet 0   SUMAtriptan (IMITREX) 25 MG tablet Take 1 tablet (25  mg total) by mouth every 2 (two) hours as needed for migraine. May repeat in 2 hours if headache persists or recurs. 10 tablet 3   tiZANidine (ZANAFLEX) 4 MG tablet Take 2 tablets (8 mg total) by mouth 2 (two) times daily. For muscle spasms 120 tablet 5   zolpidem (AMBIEN) 10 MG tablet  Take 10 mg by mouth at bedtime.     No facility-administered medications prior to visit.    Allergies  Allergen Reactions   Meperidine Hives   Prednisone     Severe high anxiety   Shellfish Allergy Shortness Of Breath and Swelling   Diltiazem Swelling   Singulair [Montelukast]     Swelling all over.    Zetia [Ezetimibe]     Swelling of face.    Acebutolol Swelling and Other (See Comments)   Amlodipine     Swelling    Celebrex [Celecoxib] Swelling    Patient began taking for knee pain and started swelling (hands, feet, face).    Codeine Hives and Nausea And Vomiting   Dexlansoprazole Other (See Comments)    Abdominal pain   Dronedarone Swelling and Other (See Comments)   Duloxetine Hcl Other (See Comments)    Made pt feel crazy   Flecainide Swelling   Gabapentin Other (See Comments)    Makes her feel crazy    Hydralazine     tachycardia   Hydrocodone Other (See Comments)    Keeps patient awake.   Lisinopril     swelling   Losartan     Swelling    Metoprolol Swelling   Mexiletine     Swelling - hands, legs, face   Mirtazapine Swelling   Omeprazole Other (See Comments)    Abdominal pain   Oxycodone Itching   Pregabalin Other (See Comments)    twitch   Repatha [Evolocumab]     Malaise    [place] [Milnacipran]     Depression   Sectral [Acebutolol Hcl] Swelling   Statins Other (See Comments)    Muscle pain   Toradol [Ketorolac Tromethamine] Itching   Tramadol     Unable to sleep, makes her itch   Valsartan     malaise, fatigue, swelling.    Review of Systems     Objective:    Physical Exam Vitals reviewed.  Constitutional:      Appearance: Normal appearance. She is normal weight.     Comments: Appears exhausted.   Cardiovascular:     Rate and Rhythm: Normal rate and regular rhythm.     Heart sounds: Normal heart sounds.  Pulmonary:     Effort: Pulmonary effort is normal. No respiratory distress.     Breath sounds: Normal breath sounds.   Musculoskeletal:        General: Tenderness (FM trigger points positive.) present.  Skin:    Comments: No graphism  Neurological:     Mental Status: She is alert and oriented to person, place, and time.  Psychiatric:        Mood and Affect: Mood normal.        Behavior: Behavior normal.     BP 110/60   Pulse 76   Temp (!) 97.3 F (36.3 C)   Resp 14   Wt 181 lb (82.1 kg)   BMI 31.07 kg/m  Wt Readings from Last 3 Encounters:  06/09/22 181 lb (82.1 kg)  06/02/22 179 lb (81.2 kg)  05/25/22 179 lb (81.2 kg)    Health Maintenance Due  Topic Date Due  Zoster Vaccines- Shingrix (1 of 2) Never done   INFLUENZA VACCINE  06/01/2022    There are no preventive care reminders to display for this patient.   Lab Results  Component Value Date   TSH 1.410 05/11/2022   Lab Results  Component Value Date   WBC 5.3 06/02/2022   HGB 14.0 06/02/2022   HCT 41.9 06/02/2022   MCV 86 06/02/2022   PLT 369 06/02/2022   Lab Results  Component Value Date   NA 137 06/02/2022   K 3.8 06/02/2022   CO2 35 (H) 06/02/2022   GLUCOSE 107 (H) 06/02/2022   BUN 22 06/02/2022   CREATININE 0.76 06/02/2022   BILITOT 0.2 06/02/2022   ALKPHOS 70 06/02/2022   AST 23 06/02/2022   ALT 23 06/02/2022   PROT 7.0 06/02/2022   ALBUMIN 4.6 06/02/2022   CALCIUM 10.3 (H) 06/02/2022   ANIONGAP 9 10/29/2021   EGFR 92 06/02/2022   Lab Results  Component Value Date   CHOL 330 (H) 04/19/2022   Lab Results  Component Value Date   HDL 67 04/19/2022   Lab Results  Component Value Date   LDLCALC 236 (H) 04/19/2022   Lab Results  Component Value Date   TRIG 147 04/19/2022   Lab Results  Component Value Date   CHOLHDL 4.9 (H) 04/19/2022   Lab Results  Component Value Date   HGBA1C 5.8 (H) 04/19/2022       Assessment & Plan:   Problem List Items Addressed This Visit       Cardiovascular and Mediastinum   Essential hypertension - Primary    Start on captopril 12.5 mg one half pill three  times a day.        Relevant Medications   captopril (CAPOTEN) 12.5 MG tablet   Other Relevant Orders   CBC with Differential/Platelet     Other   Fibromyalgia (Chronic)    Start on captopril 12.5 mg one half pill three times a day.  Medrol Dose pack sent.       Relevant Medications   methylPREDNISolone (MEDROL DOSEPAK) 4 MG TBPK tablet   Hypokalemia    Check cmp      Relevant Orders   Comprehensive metabolic panel   Allergic reaction    Check tryptase      Relevant Orders   Tryptase   Meds ordered this encounter  Medications   captopril (CAPOTEN) 12.5 MG tablet    Sig: Take 0.5 tablets (6.25 mg total) by mouth 3 (three) times daily.    Dispense:  45 tablet    Refill:  0   methylPREDNISolone (MEDROL DOSEPAK) 4 MG TBPK tablet    Sig: As directed.    Dispense:  21 each    Refill:  0    Orders Placed This Encounter  Procedures   CBC with Differential/Platelet   Comprehensive metabolic panel   Tryptase    Total time spent on today's visit was greater than 30 minutes, including both face-to-face time and nonface-to-face time personally spent on review of chart (labs and imaging), discussing labs and goals, discussing further work-up, treatment options, referrals to specialist if needed, reviewing outside records of pertinent, answering patient's questions, and coordinating care.  Follow-up: Return in about 27 days (around 07/06/2022).  An After Visit Summary was printed and given to the patient.  Rochel Brome, MD [facility] [phone]

## 2022-06-09 NOTE — Assessment & Plan Note (Signed)
Check tryptase

## 2022-06-10 ENCOUNTER — Encounter: Payer: Self-pay | Admitting: Family Medicine

## 2022-06-11 ENCOUNTER — Encounter
Payer: Medicare Other | Attending: Physical Medicine and Rehabilitation | Admitting: Physical Medicine and Rehabilitation

## 2022-06-11 ENCOUNTER — Telehealth: Payer: Self-pay

## 2022-06-11 ENCOUNTER — Encounter: Payer: Self-pay | Admitting: Physical Medicine and Rehabilitation

## 2022-06-11 VITALS — BP 145/92 | HR 73 | Ht 64.0 in | Wt 181.0 lb

## 2022-06-11 DIAGNOSIS — M797 Fibromyalgia: Secondary | ICD-10-CM | POA: Insufficient documentation

## 2022-06-11 DIAGNOSIS — M7918 Myalgia, other site: Secondary | ICD-10-CM | POA: Insufficient documentation

## 2022-06-11 LAB — COMPREHENSIVE METABOLIC PANEL
ALT: 31 IU/L (ref 0–32)
AST: 28 IU/L (ref 0–40)
Albumin/Globulin Ratio: 2 (ref 1.2–2.2)
Albumin: 4.3 g/dL (ref 3.8–4.9)
Alkaline Phosphatase: 60 IU/L (ref 44–121)
BUN/Creatinine Ratio: 36 — ABNORMAL HIGH (ref 9–23)
BUN: 29 mg/dL — ABNORMAL HIGH (ref 6–24)
Bilirubin Total: 0.2 mg/dL (ref 0.0–1.2)
CO2: 26 mmol/L (ref 20–29)
Calcium: 9.3 mg/dL (ref 8.7–10.2)
Chloride: 97 mmol/L (ref 96–106)
Creatinine, Ser: 0.8 mg/dL (ref 0.57–1.00)
Globulin, Total: 2.2 g/dL (ref 1.5–4.5)
Glucose: 104 mg/dL — ABNORMAL HIGH (ref 70–99)
Potassium: 3.7 mmol/L (ref 3.5–5.2)
Sodium: 137 mmol/L (ref 134–144)
Total Protein: 6.5 g/dL (ref 6.0–8.5)
eGFR: 86 mL/min/{1.73_m2} (ref >59)

## 2022-06-11 LAB — CBC WITH DIFFERENTIAL/PLATELET
Basophils Absolute: 0.1 10*3/uL (ref 0.0–0.2)
Basos: 1 %
EOS (ABSOLUTE): 0.2 10*3/uL (ref 0.0–0.4)
Eos: 4 %
Hematocrit: 41 % (ref 34.0–46.6)
Hemoglobin: 13 g/dL (ref 11.1–15.9)
Immature Grans (Abs): 0 10*3/uL (ref 0.0–0.1)
Immature Granulocytes: 0 %
Lymphocytes Absolute: 2.2 10*3/uL (ref 0.7–3.1)
Lymphs: 46 %
MCH: 27.5 pg (ref 26.6–33.0)
MCHC: 31.7 g/dL (ref 31.5–35.7)
MCV: 87 fL (ref 79–97)
Monocytes Absolute: 0.5 10*3/uL (ref 0.1–0.9)
Monocytes: 10 %
Neutrophils Absolute: 1.9 10*3/uL (ref 1.4–7.0)
Neutrophils: 39 %
Platelets: 330 10*3/uL (ref 150–450)
RBC: 4.72 x10E6/uL (ref 3.77–5.28)
RDW: 13.2 % (ref 11.7–15.4)
WBC: 4.8 10*3/uL (ref 3.4–10.8)

## 2022-06-11 LAB — TRYPTASE: Tryptase: 13.1 ug/L (ref 2.2–13.2)

## 2022-06-11 NOTE — Patient Instructions (Signed)
Plan: Patient here for trigger point injections for  Consent done and on chart.  Cleaned areas with alcohol and injected using a 27 gauge 1.5 inch needle  Injected 3cc Using 1% Lidocaine with no EPI  Upper traps B/L  Levators- B/L  Posterior scalenes Middle scalenes Splenius Capitus Pectoralis Major- B/L  Rhomboids B/L2 on R Infraspinatus Teres Major/minor Thoracic paraspinals Lumbar paraspinals Other injections-    Patient's level of pain prior was 7/10 Current level of pain after injections is- 6.75/10- a litlte bit more ROM.   There was no bleeding or complications.  Patient was advised to drink a lot of water on day after injections to flush system Will have increased soreness for 12-48 hours after injections.  Can use Lidocaine patches the day AFTER injections Can use theracane on day of injections in places didn't inject Can use heating pad 4-6 hours AFTER injections    2. Increase Low dose naltrexone-   3. F/u as scheduled.at the 3 months mark- trigger point injections.

## 2022-06-11 NOTE — Telephone Encounter (Signed)
Called patient made her aware of her labs. Tryptase lab is still pending and will call as soon as we get results.

## 2022-06-11 NOTE — Progress Notes (Signed)
Pt is a 56 yr old female with hx of HTN, moderate persistent asthma; hypothyroidism- latest TSH 1.41, fibromyalgia, Migraines, and BMI 31; Told she has Kidney disease? Thinks due to swelling up so much- -  Latest BUN/Cr 22/0.77.  Has many allergies 30 meds- also has IBS Here for evaluation for pain.  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  Also has trochanteric bursitis.  Here for trigger point injections.   Went to hospital for a few days- K+ bottomed out.   Hard to tell if Low dose naltrexone-was real helpful- slightly helpful, but ran out.  Getting new dose today.  No side effects.   Tried lidocaine patches- helps when can keep on skin- no side effects  Plan: Patient here for trigger point injections for  Consent done and on chart.  Cleaned areas with alcohol and injected using a 27 gauge 1.5 inch needle  Injected 3cc Using 1% Lidocaine with no EPI  Upper traps B/L  Levators- B/L  Posterior scalenes Middle scalenes Splenius Capitus Pectoralis Major- B/L  Rhomboids B/L2 on R Infraspinatus Teres Major/minor Thoracic paraspinals Lumbar paraspinals Other injections-    Patient's level of pain prior was 7/10 Current level of pain after injections is- 6.75/10- a litlte bit more ROM.   There was no bleeding or complications.  Patient was advised to drink a lot of water on day after injections to flush system Will have increased soreness for 12-48 hours after injections.  Can use Lidocaine patches the day AFTER injections Can use theracane on day of injections in places didn't inject Can use heating pad 4-6 hours AFTER injections    2. Increase Low dose naltrexone-   3. F/u as scheduled.at the 3 months mark- trigger point injections.

## 2022-06-16 ENCOUNTER — Encounter: Payer: Self-pay | Admitting: Family Medicine

## 2022-06-16 ENCOUNTER — Other Ambulatory Visit: Payer: Self-pay

## 2022-06-16 DIAGNOSIS — E039 Hypothyroidism, unspecified: Secondary | ICD-10-CM

## 2022-06-16 MED ORDER — LEVOTHYROXINE SODIUM 112 MCG PO TABS: 112.0000 ug | ORAL_TABLET | Freq: Every day | ORAL | 1 refills | Status: DC

## 2022-06-18 ENCOUNTER — Encounter: Payer: Self-pay | Admitting: Family Medicine

## 2022-06-18 NOTE — Assessment & Plan Note (Signed)
Check cmp 

## 2022-06-18 NOTE — Assessment & Plan Note (Signed)
Not at goal.  Worsens with hypokalemia.

## 2022-06-18 NOTE — Assessment & Plan Note (Addendum)
Start hydralazine 10 mg three times a day. Stop quinapril. Continue chlorthalidone 25 mg daily.

## 2022-06-18 NOTE — Assessment & Plan Note (Signed)
Continue on Quinapril and check blood pressure at home. Check labs.

## 2022-06-22 ENCOUNTER — Encounter: Payer: Self-pay | Admitting: Family Medicine

## 2022-06-23 ENCOUNTER — Encounter: Payer: Self-pay | Admitting: Family Medicine

## 2022-06-30 ENCOUNTER — Encounter: Payer: Self-pay | Admitting: Physical Medicine and Rehabilitation

## 2022-07-02 NOTE — Assessment & Plan Note (Signed)
Medrol dose pack given to use on cruise if needed.

## 2022-07-02 NOTE — Progress Notes (Unsigned)
Subjective:  Patient ID: Laura [name], female    DOB: 19-Mar-1966  Age: 56 y.o. MRN: [id number]  Chief Complaint  Patient presents with   Hypertension    1 Month Follow up    HPI  Patient is here today for a 1 month follow up for fibromyalgia. Patient start captopril 12.5 mg half a pill three times a day, a month ago at last visit. Current Outpatient Medications on File Prior to Visit  Medication Sig Dispense Refill   albuterol (VENTOLIN HFA) 108 (90 Base) MCG/ACT inhaler Inhale 2 puffs into the lungs every 4 (four) hours as needed for wheezing or shortness of breath. 18 g 3   ALPRAZolam (XANAX) 1 MG tablet Take 1 mg by mouth 4 (four) times daily as needed for anxiety.     amphetamine-dextroamphetamine (ADDERALL) 30 MG tablet Take 1 tablet by mouth 3 (three) times daily.     Blood Glucose Monitoring Suppl (ACCU-CHEK GUIDE) w/Device KIT 1 each by Does not apply route daily. 1 kit 0   buPROPion (WELLBUTRIN XL) 150 MG 24 hr tablet Take by mouth.     captopril (CAPOTEN) 12.5 MG tablet Take 0.5 tablets (6.25 mg total) by mouth 3 (three) times daily. 45 tablet 0   celecoxib (CELEBREX) 200 MG capsule TAKE 1 CAPSULE BY MOUTH EVERY DAY WITH A MEAL     chlorthalidone (HYGROTON) 25 MG tablet Take 1 tablet (25 mg total) by mouth daily. 30 tablet 1   cloNIDine (CATAPRES) 0.1 MG tablet      EPINEPHrine 0.3 mg/0.3 mL IJ SOAJ injection Inject 0.3 mLs (0.3 mg total) into the muscle as needed for anaphylaxis. 1 each 2   furosemide (LASIX) 20 MG tablet Take 20 mg by mouth 2 (two) times daily.     glucose blood (ACCU-CHEK GUIDE) test strip 1 each by Other route daily in the afternoon. Use as instructed 100 each 12   hydrOXYzine (ATARAX) 25 MG tablet TAKE 1 TABLET BY MOUTH EVERY 6 HOURS AS NEEDED FOR ITCHING     hydrOXYzine (VISTARIL) 100 MG capsule Take 1 capsule (100 mg total) by mouth 3 (three) times daily as needed for itching. 30 capsule 0   ipratropium-albuterol (DUONEB) 0.5-2.5 (3) MG/3ML SOLN Take  3 mLs by nebulization every 4 (four) hours as needed. (Patient taking differently: Take 3 mLs by nebulization every 4 (four) hours as needed (asthma).) 360 mL 2   levothyroxine (SYNTHROID) 112 MCG tablet Take 1 tablet (112 mcg total) by mouth daily. 90 tablet 1   meclizine (ANTIVERT) 25 MG tablet Take 1 tablet (25 mg total) by mouth 3 (three) times daily as needed for dizziness. 30 tablet 0   methylPREDNISolone (MEDROL DOSEPAK) 4 MG TBPK tablet As directed. 21 each 0   mexiletine (MEXITIL) 150 MG capsule      mometasone (ELOCON) 0.1 % cream Apply 1 Application topically daily. 50 g 2   mupirocin ointment (BACTROBAN) 2 % Apply 1 application topically 2 (two) times daily. 22 g 0   nystatin powder APPLY TO THE AFFECTED AREA BID     ondansetron (ZOFRAN) 4 MG tablet Take 1 tablet (4 mg total) by mouth every 8 (eight) hours as needed for nausea or vomiting. 20 tablet 0   potassium chloride SA (KLOR-CON M) 20 MEQ tablet Take 2 tablets (40 mEq total) by mouth 2 (two) times daily. 360 tablet 0   quinapril (ACCUPRIL) 20 MG tablet      SUMAtriptan (IMITREX) 25 MG tablet Take 1 tablet (  25 mg total) by mouth every 2 (two) hours as needed for migraine. May repeat in 2 hours if headache persists or recurs. 10 tablet 3   tamsulosin (FLOMAX) 0.4 MG CAPS capsule      tiZANidine (ZANAFLEX) 4 MG tablet Take 2 tablets (8 mg total) by mouth 2 (two) times daily. For muscle spasms 120 tablet 5   triamcinolone cream (KENALOG) 0.1 % APPLY TOPICALLY TO THE AFFECTED AREA TWICE DAILY     zolpidem (AMBIEN) 10 MG tablet Take 10 mg by mouth at bedtime.     No current facility-administered medications on file prior to visit.   Past Medical History:  Diagnosis Date   Acute GI bleeding 09/01/2019   Anxiety and depression    Bowel obstruction ([place])    Chest pain    a. 01/2016 Ex MV: Hypertensive response. Freq PVCs w/ exercise. nl EF. No ST/T changes. No ischemia.   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 03/2016 Echo: EF 60-65%, no rwma, mild MR, nl LA size, nl RV fxn.   High cholesterol    Kidney stones    Nasal septal perforation 05/12/2018   Hx of cocaine use   Palpitations    a. 03/2016 Holter: Sinus rhythm, avg HR 83, max 123, min 64. 4 PACs. 10,356 isolated PVCs, one vent couplet, 3842 V bigeminy, 4 beats NSVT->prev on BB - dc 2/2 swelling.   Prediabetes 12/23/2015   Overview:  Hba1c higher but not diabetic. Took metformin to try to lessen   Raynaud disease    Rectocele    Shingles    Sleep apnea    "mild" per pt   Torn rotator cuff    left   Urinary retention with incomplete bladder emptying    Vaginal dryness, menopausal    Vaginal enterocele    Vitamin D deficiency 12/03/2014   Wears hearing aid in both ears    Past Surgical History:  Procedure Laterality Date   51 HOUR [place] STUDY N/A 10/11/2018   Procedure: [place];  Surgeon: Mauri Pole, MD;  Location: [facility];  Service: Endoscopy;  Laterality: N/A;   ABDOMINAL HYSTERECTOMY     ANKLE SURGERY     ran over by mother in car by [place]  09/02/2019   Procedure: BIOPSY;  Surgeon: Rush Landmark Telford Nab., MD;  Location: [place];  Service: Gastroenterology;;   COLONOSCOPY     COLONOSCOPY WITH PROPOFOL N/A 09/02/2019   Procedure: COLONOSCOPY WITH PROPOFOL;  Surgeon: Irving Copas., MD;  Location: [facility];  Service: Gastroenterology;  Laterality: N/A;   COLPORRHAPHY  2015   posterior and enterocele ligation   CYSTOSCOPY  04/11/2017   Procedure: CYSTOSCOPY;  Surgeon: Defrancesco, Alanda Slim, MD;  Location: [facility];  Service: Gynecology;;   ESOPHAGEAL MANOMETRY N/A 10/11/2018   Procedure: ESOPHAGEAL MANOMETRY (EM);  Surgeon: Mauri Pole, MD;  Location: [facility];  Service: Endoscopy;  Laterality: N/A;   ESOPHAGOGASTRODUODENOSCOPY (EGD) WITH PROPOFOL N/A 09/02/2019    Procedure: ESOPHAGOGASTRODUODENOSCOPY (EGD) WITH PROPOFOL;  Surgeon: Rush Landmark Telford Nab., MD;  Location: [facility];  Service: Gastroenterology;  Laterality: N/A;   EXTRACORPOREAL SHOCK WAVE LITHOTRIPSY Left 07/31/2020   Procedure: EXTRACORPOREAL SHOCK WAVE LITHOTRIPSY (ESWL);  Surgeon: Billey Co, MD;  Location: [facility];  Service: Urology;  Laterality: Left;   KNEE ARTHROSCOPY WITH MEDIAL MENISECTOMY Right 02/04/2020   Procedure: KNEE ARTHROSCOPY WITH PARTIAL LATERAL AND MEDIAL MENISECTOMY,  PARTIAL SYNOVECTOMY AND CHONDROPLASTY;  Surgeon: Lovell Sheehan, MD;  Location: [facility];  Service: Orthopedics;  Laterality: Right;   LAPAROSCOPIC SALPINGO OOPHERECTOMY Left 04/11/2017   Procedure: LAPAROSCOPIC LEFT SALPINGO OOPHORECTOMY;  Surgeon: Brayton Mars, MD;  Location: [facility];  Service: Gynecology;  Laterality: Left;   LITHOTRIPSY     OOPHORECTOMY     PARTIAL HYSTERECTOMY     [place] IMPEDANCE STUDY N/A 10/11/2018   Procedure: [place] IMPEDANCE STUDY;  Surgeon: Mauri Pole, MD;  Location: [facility];  Service: Endoscopy;  Laterality: N/A;   PVC ABLATION N/A 01/18/2020   Procedure: PVC ABLATION;  Surgeon: Thompson Grayer, MD;  Location: [facility];  Service: Cardiovascular;  Laterality: N/A;   thumb surgery     UPPER GASTROINTESTINAL ENDOSCOPY      Family History  Problem Relation Age of Onset   Stroke Father    Diabetes Father    Breast cancer Mother 45   Diverticulitis Mother    Esophageal cancer Maternal Grandfather    Colon cancer Paternal Grandmother    Suicidality Brother    Ovarian cancer Neg Hx    Stomach cancer Neg Hx    Social History   Socioeconomic History   Marital status: Single    Spouse name: Not on file   Number of children: 4   Years of education: Not on file   Highest education level: Some college, no degree  Occupational History   Occupation: Disabled  Tobacco Use   Smoking status: Former    Types: Cigarettes    Quit date:  04/08/1995    Years since quitting: 27.2   Smokeless tobacco: Never  Vaping Use   Vaping Use: Never used  Substance and Sexual Activity   Alcohol use: Not Currently    Alcohol/week: 1.0 standard drink of alcohol    Types: 1 Glasses of wine per week    Comment: rare; once every 6 months   Drug use: No   Sexual activity: Yes    Partners: Male    Birth control/protection: Surgical  Other Topics Concern   Not on file  Social History Narrative   Separated - 1 son and 1 daughter   Disabled    1 caffeine/day   Past smoker   No EtOH, drugs      08/02/2018      Social Determinants of Health   Financial Resource Strain: Low Risk  (08/05/2021)   Overall Financial Resource Strain (CARDIA)    Difficulty of Paying Living Expenses: Not hard at all  Food Insecurity: No Food Insecurity (08/05/2021)   Hunger Vital Sign    Worried About Running Out of Food in the Last Year: Never true    [place] in the Last Year: Never true  Transportation Needs: No Transportation Needs (08/05/2021)   PRAPARE - Hydrologist (Medical): No    Lack of Transportation (Non-Medical): No  Physical Activity: Sufficiently Active (08/05/2021)   Exercise Vital Sign    Days of Exercise per Week: 6 days    Minutes of Exercise per Session: 60 min  Stress: No Stress Concern Present (08/05/2021)   [place]    Feeling of Stress : Not at all  Social Connections: Somewhat Isolated (10/13/2018)   Social Connection and Isolation Panel [NHANES]  Frequency of Communication with Friends and Family: More than three times a week    Frequency of Social Gatherings with Friends and Family: Once a week    Attends Religious Services: More than 4 times per year    Active Member of [employer] or Organizations: No    Attends Archivist Meetings: Never    Marital Status: Separated    Review of Systems  Constitutional:  Negative  for fatigue.  HENT:  Negative for congestion, ear pain and sore throat.   Respiratory:  Negative for cough and shortness of breath.   Cardiovascular:  Negative for chest pain.  Gastrointestinal:  Negative for abdominal pain, constipation, diarrhea, nausea and vomiting.  Genitourinary:  Negative for dysuria, frequency and urgency.  Musculoskeletal:  Negative for arthralgias, back pain and myalgias.  Neurological:  Negative for dizziness and headaches.  Psychiatric/Behavioral:  Negative for agitation and sleep disturbance. The patient is not nervous/anxious.      Objective:  There were no vitals taken for this visit.     06/11/2022   11:45 AM 06/11/2022   11:41 AM 06/09/2022    8:53 AM  BP/Weight  Systolic BP 130 865 784  Diastolic BP 92 696 60  Wt. (Lbs)  181 181  BMI  31.07 kg/m2 31.07 kg/m2    Physical Exam Vitals reviewed.  Constitutional:      Appearance: Normal appearance. She is normal weight.  Neck:     Vascular: No carotid bruit.  Cardiovascular:     Rate and Rhythm: Normal rate and regular rhythm.     Heart sounds: Normal heart sounds.  Pulmonary:     Effort: Pulmonary effort is normal.     Breath sounds: Normal breath sounds.  Abdominal:     General: Abdomen is flat. Bowel sounds are normal.     Palpations: Abdomen is soft.     Tenderness: There is no abdominal tenderness.  Neurological:     Mental Status: She is alert and oriented to person, place, and time.  Psychiatric:        Mood and Affect: Mood normal.        Behavior: Behavior normal.     Diabetic Foot Exam - Simple   No data filed      Lab Results  Component Value Date   WBC 4.8 06/09/2022   HGB 13.0 06/09/2022   HCT 41.0 06/09/2022   PLT 330 06/09/2022   GLUCOSE 104 (H) 06/09/2022   CHOL 330 (H) 04/19/2022   TRIG 147 04/19/2022   HDL 67 04/19/2022   LDLDIRECT 222 (H) 04/26/2019   LDLCALC 236 (H) 04/19/2022   ALT 31 06/09/2022   AST 28 06/09/2022   NA 137 06/09/2022   K 3.7  06/09/2022   CL 97 06/09/2022   CREATININE 0.80 06/09/2022   BUN 29 (H) 06/09/2022   CO2 26 06/09/2022   TSH 1.410 05/11/2022   INR 0.9 08/11/2017   HGBA1C 5.8 (H) 04/19/2022      Assessment & Plan:   Problem List Items Addressed This Visit   None .  No orders of the defined types were placed in this encounter.   No orders of the defined types were placed in this encounter.    Follow-up: No follow-ups on file.  An After Visit Summary was printed and given to the patient.  Rochel Brome, MD [facility] [phone]

## 2022-07-06 ENCOUNTER — Ambulatory Visit (INDEPENDENT_AMBULATORY_CARE_PROVIDER_SITE_OTHER): Payer: Medicare Other | Admitting: Family Medicine

## 2022-07-06 ENCOUNTER — Encounter: Payer: Self-pay | Admitting: Family Medicine

## 2022-07-06 VITALS — BP 134/80 | HR 78 | Temp 97.3°F | Resp 16 | Ht 64.0 in | Wt 177.0 lb

## 2022-07-06 DIAGNOSIS — M1711 Unilateral primary osteoarthritis, right knee: Secondary | ICD-10-CM | POA: Diagnosis not present

## 2022-07-06 DIAGNOSIS — I10 Essential (primary) hypertension: Secondary | ICD-10-CM

## 2022-07-06 DIAGNOSIS — M797 Fibromyalgia: Secondary | ICD-10-CM | POA: Diagnosis not present

## 2022-07-06 MED ORDER — METHYLPREDNISOLONE 4 MG PO TBPK: ORAL_TABLET | ORAL | 0 refills | Status: DC

## 2022-07-06 MED ORDER — CHLORTHALIDONE 25 MG PO TABS: 25.0000 mg | ORAL_TABLET | Freq: Every day | ORAL | 1 refills | Status: DC

## 2022-07-06 MED ORDER — CAPTOPRIL 12.5 MG PO TABS
12.5000 mg | ORAL_TABLET | Freq: Three times a day (TID) | ORAL | 0 refills | Status: DC
Start: 2022-07-06 — End: 2022-08-12

## 2022-07-06 NOTE — Assessment & Plan Note (Signed)
Recommend low dose nsaid before anticipated significan exercise. Recommend ice after exertion.  May use diclofenac gel. May use tylenol.

## 2022-07-06 NOTE — Assessment & Plan Note (Signed)
Continue Captopril 12.5 mg half a pill three times a day.

## 2022-07-16 ENCOUNTER — Other Ambulatory Visit: Payer: Self-pay | Admitting: Physical Medicine and Rehabilitation

## 2022-07-16 MED ORDER — HYDROCODONE-ACETAMINOPHEN 5-325 MG PO TABS: 1.0000 | ORAL_TABLET | Freq: Two times a day (BID) | ORAL | 0 refills | Status: DC | PRN

## 2022-07-16 MED ORDER — HYDROCODONE-ACETAMINOPHEN 5-325 MG PO TABS: 1.0000 | ORAL_TABLET | Freq: Three times a day (TID) | ORAL | 0 refills | Status: DC | PRN

## 2022-08-03 ENCOUNTER — Other Ambulatory Visit: Payer: Self-pay | Admitting: Family Medicine

## 2022-08-03 DIAGNOSIS — I10 Essential (primary) hypertension: Secondary | ICD-10-CM

## 2022-08-09 ENCOUNTER — Telehealth: Payer: Self-pay

## 2022-08-09 NOTE — Chronic Care Management (AMB) (Signed)
Patient Assistance Coordination  2024 [employer] patient assistance application for [employer] updated and uploaded to be mailed to patient.   Pattricia Boss, [place] Pharmacist Assistant [phone]

## 2022-08-11 ENCOUNTER — Encounter: Payer: Self-pay | Admitting: Family Medicine

## 2022-08-11 NOTE — Progress Notes (Signed)
Subjective:  Patient ID: Laura [name], female    DOB: 1966/02/11  Age: 56 y.o. MRN: [id number]  Chief Complaint  Patient presents with   Prediabetes   Hypothyroidism   Hyperlipidemia    HPI Hypertension: Taking Captopril 12.5 mg TID, Chlorthalidone 25 mg daily, furosemide 20 mg 2 times a day. Bp well controlled. Patient is prone to hypokalemia. On potassium chloride 20 meq 2 oral twice daily.   Hypothyroidism: Currently on Levothyroxine 112 mcg daily.  Insomnia: Zolpidem 10 mg daily.  ADHD: She stopped Adderall 30 mg three times a day. She stopped it because it seemed to make her jittery and cause sleep problems. Attention is much worse. Prescribed by Dr. Toy Care.   Anxiety: Xanax 1 mg four times a day PRN. She is not taking wellbutrin 150 mg. Given by Dr. Toy Care.   Asthma:  Using Duoneb 3 ml by nebulization every 4 hours as needed and albuterol 108 mcg inhaler 2 puffs into the lungs 4 hours PRN. Has symbicort.   Insomnia: Zolpidem 10 mg at bedtime.  Current Outpatient Medications on File Prior to Visit  Medication Sig Dispense Refill   albuterol (VENTOLIN HFA) 108 (90 Base) MCG/ACT inhaler Inhale 2 puffs into the lungs every 4 (four) hours as needed for wheezing or shortness of breath. 18 g 3   ALPRAZolam (XANAX) 1 MG tablet Take 1 mg by mouth 4 (four) times daily as needed for anxiety.     Blood Glucose Monitoring Suppl (ACCU-CHEK GUIDE) w/Device KIT 1 each by Does not apply route daily. 1 kit 0   chlorthalidone (HYGROTON) 25 MG tablet Take 1 tablet (25 mg total) by mouth daily. 90 tablet 1   EPINEPHrine 0.3 mg/0.3 mL IJ SOAJ injection Inject 0.3 mLs (0.3 mg total) into the muscle as needed for anaphylaxis. 1 each 2   glucose blood (ACCU-CHEK GUIDE) test strip 1 each by Other route daily in the afternoon. Use as instructed 100 each 12   hydrOXYzine (VISTARIL) 100 MG capsule Take 1 capsule (100 mg total) by mouth 3 (three) times daily as needed for itching. 30 capsule 0    ipratropium-albuterol (DUONEB) 0.5-2.5 (3) MG/3ML SOLN Take 3 mLs by nebulization every 4 (four) hours as needed. (Patient taking differently: Take 3 mLs by nebulization every 4 (four) hours as needed (asthma).) 360 mL 2   levothyroxine (SYNTHROID) 112 MCG tablet Take 1 tablet (112 mcg total) by mouth daily. 90 tablet 1   nystatin powder APPLY TO THE AFFECTED AREA BID     SUMAtriptan (IMITREX) 25 MG tablet Take 1 tablet (25 mg total) by mouth every 2 (two) hours as needed for migraine. May repeat in 2 hours if headache persists or recurs. 10 tablet 3   tiZANidine (ZANAFLEX) 4 MG tablet Take 2 tablets (8 mg total) by mouth 2 (two) times daily. For muscle spasms 120 tablet 5   zolpidem (AMBIEN) 10 MG tablet Take 10 mg by mouth at bedtime.     buPROPion (WELLBUTRIN XL) 150 MG 24 hr tablet Take by mouth.     meclizine (ANTIVERT) 25 MG tablet Take 1 tablet (25 mg total) by mouth 3 (three) times daily as needed for dizziness. 30 tablet 0   No current facility-administered medications on file prior to visit.   Past Medical History:  Diagnosis Date   Acute GI bleeding 09/01/2019   Anxiety and depression    Bowel obstruction ([place])    Chest pain    a. 01/2016 Ex MV: Hypertensive response.  Freq PVCs w/ exercise. nl EF. No ST/T changes. No ischemia.   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 03/2016 Echo: EF 60-65%, no rwma, mild MR, nl LA size, nl RV fxn.   Herpes zoster without complication 98/26/4158   High cholesterol    Kidney stones    Nasal septal perforation 05/12/2018   Hx of cocaine use   Palpitations    a. 03/2016 Holter: Sinus rhythm, avg HR 83, max 123, min 64. 4 PACs. 10,356 isolated PVCs, one vent couplet, 3842 V bigeminy, 4 beats NSVT->prev on BB - dc 2/2 swelling.   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   Prediabetes 12/23/2015   Overview:  Hba1c higher but not diabetic. Took  metformin to try to lessen   Raynaud disease    Rectocele    Shingles    Sleep apnea    "mild" per pt   Status post hysterectomy 03/08/2017   Torn rotator cuff    left   Urinary retention with incomplete bladder emptying    Vaginal dryness, menopausal    Vaginal enterocele    Vitamin D deficiency 12/03/2014   Wears hearing aid in both ears    Past Surgical History:  Procedure Laterality Date   95 HOUR [place] STUDY N/A 10/11/2018   Procedure: [place];  Surgeon: Mauri Pole, MD;  Location: [facility];  Service: Endoscopy;  Laterality: N/A;   ABDOMINAL HYSTERECTOMY     ANKLE SURGERY     ran over by mother in car by [place]  09/02/2019   Procedure: BIOPSY;  Surgeon: Rush Landmark Telford Nab., MD;  Location: [place];  Service: Gastroenterology;;   COLONOSCOPY     COLONOSCOPY WITH PROPOFOL N/A 09/02/2019   Procedure: COLONOSCOPY WITH PROPOFOL;  Surgeon: Irving Copas., MD;  Location: [facility];  Service: Gastroenterology;  Laterality: N/A;   COLPORRHAPHY  2015   posterior and enterocele ligation   CYSTOSCOPY  04/11/2017   Procedure: CYSTOSCOPY;  Surgeon: Defrancesco, Alanda Slim, MD;  Location: [facility];  Service: Gynecology;;   ESOPHAGEAL MANOMETRY N/A 10/11/2018   Procedure: ESOPHAGEAL MANOMETRY (EM);  Surgeon: Mauri Pole, MD;  [facility];  Service: Endoscopy;  Laterality: N/A;   ESOPHAGOGASTRODUODENOSCOPY (EGD) WITH PROPOFOL N/A 09/02/2019   Procedure: ESOPHAGOGASTRODUODENOSCOPY (EGD) WITH PROPOFOL;  Surgeon: Rush Landmark Telford Nab., MD;  Location: [facility];  Service: Gastroenterology;  Laterality: N/A;   EXTRACORPOREAL SHOCK WAVE LITHOTRIPSY Left 07/31/2020   Procedure: EXTRACORPOREAL SHOCK WAVE LITHOTRIPSY (ESWL);  Surgeon: Billey Co, MD;  Location: [facility];  Service: Urology;  Laterality: Left;   KNEE ARTHROSCOPY WITH MEDIAL MENISECTOMY Right 02/04/2020   Procedure: KNEE ARTHROSCOPY WITH PARTIAL  LATERAL AND MEDIAL MENISECTOMY,  PARTIAL SYNOVECTOMY AND CHONDROPLASTY;  Surgeon: Lovell Sheehan, MD;  Location: [place];  Service: Orthopedics;  Laterality: Right;   LAPAROSCOPIC SALPINGO OOPHERECTOMY Left 04/11/2017   Procedure: LAPAROSCOPIC LEFT SALPINGO OOPHORECTOMY;  Surgeon: Brayton Mars, MD;  Location: [facility];  Service: Gynecology;  Laterality: Left;   LITHOTRIPSY     OOPHORECTOMY     PARTIAL HYSTERECTOMY     [name] IMPEDANCE STUDY N/A 10/11/2018   Procedure: [place] IMPEDANCE STUDY;  Surgeon: Mauri Pole, MD;  [facility];  Service: Endoscopy;  Laterality: N/A;   PVC ABLATION N/A 01/18/2020  Procedure: PVC ABLATION;  Surgeon: Thompson Grayer, MD;  Location: [facility];  Service: Cardiovascular;  Laterality: N/A;   thumb surgery     UPPER GASTROINTESTINAL ENDOSCOPY      Family History  Problem Relation Age of Onset   Stroke Father    Diabetes Father    Breast cancer Mother 37   Diverticulitis Mother    Esophageal cancer Maternal Grandfather    Colon cancer Paternal Grandmother    Suicidality Brother    Ovarian cancer Neg Hx    Stomach cancer Neg Hx    Social History   Socioeconomic History   Marital status: Single    Spouse name: Not on file   Number of children: 4   Years of education: Not on file   Highest education level: Some college, no degree  Occupational History   Occupation: Disabled  Tobacco Use   Smoking status: Former    Types: Cigarettes    Quit date: 04/08/1995    Years since quitting: 27.3   Smokeless tobacco: Never  Vaping Use   Vaping Use: Never used  Substance and Sexual Activity   Alcohol use: Yes    Alcohol/week: 1.0 standard drink of alcohol    Types: 1 Glasses of wine per week    Comment: rare; once every 6 months   Drug use: No   Sexual activity: Not Currently    Partners: Male    Birth control/protection: Surgical  Other Topics Concern   Not on file  Social History Narrative   Separated - 1 son  and 1 daughter   Disabled    1 caffeine/day   Past smoker   No EtOH, drugs      08/02/2018      Social Determinants of Health   Financial Resource Strain: Low Risk  (08/05/2021)   Overall Financial Resource Strain (CARDIA)    Difficulty of Paying Living Expenses: Not hard at all  Food Insecurity: No Food Insecurity (08/05/2021)   Hunger Vital Sign    Worried About Running Out of Food in the Last Year: Never true    Ord in the Last Year: Never true  Transportation Needs: No Transportation Needs (08/05/2021)   PRAPARE - Hydrologist (Medical): No    Lack of Transportation (Non-Medical): No  Physical Activity: Sufficiently Active (08/05/2021)   Exercise Vital Sign    Days of Exercise per Week: 6 days    Minutes of Exercise per Session: 60 min  Stress: No Stress Concern Present (08/05/2021)   [place]    Feeling of Stress : Not at all  Social Connections: Somewhat Isolated (10/13/2018)   Social Connection and Isolation Panel [NHANES]    Frequency of Communication with Friends and Family: More than three times a week    Frequency of Social Gatherings with Friends and Family: Once a week    Attends Religious Services: More than 4 times per year    Active Member of [employer] or Organizations: No    Attends Archivist Meetings: Never    Marital Status: Separated    Review of Systems  Constitutional:  Negative for chills, fatigue and fever.  HENT:  Negative for congestion, ear pain and sore throat.   Respiratory:  Negative for cough and shortness of breath.   Cardiovascular:  Negative for chest pain and palpitations.  Gastrointestinal:  Negative for abdominal pain, constipation, diarrhea, nausea and vomiting.  Endocrine: Negative for polydipsia, polyphagia and polyuria.  Genitourinary:  Negative for difficulty urinating and dysuria.  Musculoskeletal:  Negative for  arthralgias, back pain and myalgias.  Skin:  Negative for rash.  Neurological:  Negative for headaches.  Psychiatric/Behavioral:  Negative for dysphoric mood. The patient is not nervous/anxious.      Objective:  BP 110/70 (BP Location: Left Arm, Cuff Size: Normal)   Pulse 71   Temp (!) 97.2 F (36.2 C)   Resp 14   Ht 5' 4"  (1.626 m)   Wt 182 lb (82.6 kg)   SpO2 96%   BMI 31.24 kg/m      08/16/2022    2:26 PM 08/12/2022    8:36 AM 08/12/2022    8:27 AM  BP/Weight  Systolic BP 539 767 341  Diastolic BP 937 70 82  Wt. (Lbs) 183.8  182  BMI 31.55 kg/m2  31.24 kg/m2    Physical Exam Vitals reviewed.  Constitutional:      Appearance: Normal appearance. She is normal weight.  Neck:     Vascular: No carotid bruit.  Cardiovascular:     Rate and Rhythm: Normal rate and regular rhythm.     Heart sounds: Normal heart sounds.  Pulmonary:     Effort: Pulmonary effort is normal. No respiratory distress.     Breath sounds: Normal breath sounds.  Abdominal:     General: Abdomen is flat. Bowel sounds are normal.     Palpations: Abdomen is soft.     Tenderness: There is no abdominal tenderness.  Neurological:     Mental Status: She is alert and oriented to person, place, and time.  Psychiatric:        Mood and Affect: Mood normal.        Behavior: Behavior normal.     Diabetic Foot Exam - Simple   No data filed      Lab Results  Component Value Date   WBC 4.3 08/12/2022   HGB 14.2 08/12/2022   HCT 43.7 08/12/2022   PLT 319 08/12/2022   GLUCOSE 92 08/16/2022   CHOL 360 (H) 08/12/2022   TRIG 220 (H) 08/12/2022   HDL 61 08/12/2022   LDLDIRECT 222 (H) 04/26/2019   LDLCALC 254 (H) 08/12/2022   ALT 14 08/16/2022   AST 15 08/16/2022   NA 139 08/16/2022   K 4.2 08/16/2022   CL 97 08/16/2022   CREATININE 0.77 08/16/2022   BUN 18 08/16/2022   CO2 26 08/16/2022   TSH 1.410 05/11/2022   INR 0.9 08/11/2017   HGBA1C 6.3 (H) 08/12/2022      Assessment & Plan:    Problem List Items Addressed This Visit       Cardiovascular and Mediastinum   Essential hypertension, benign - Primary    Well controlled.  No changes to medicines. Continue Captopril 12.5 mg TID, Chlorthalidone 25 mg daily, furosemide 20 mg 2 times a day. Continue to work on eating a healthy diet and exercise.  Labs drawn today.        Relevant Medications   furosemide (LASIX) 20 MG tablet   captopril (CAPOTEN) 12.5 MG tablet   Other Relevant Orders   Comprehensive metabolic panel (Completed)   CBC with Differential/Platelet (Completed)   Cardiovascular Risk Assessment (Completed)     Respiratory   OSA on CPAP     Endocrine   Hypothyroidism, acquired    Check thyroid levels.  The current medical regimen is effective;  continue present plan and medications.  Other   Fibromyalgia (Chronic)    Continue tizanidine.       Vitamin D insufficiency    Check vit D level      Relevant Orders   VITAMIN D 25 Hydroxy (Vit-D Deficiency, Fractures) (Completed)   Mixed hyperlipidemia    Restart repatha 140 mg every 2 weeks.       Relevant Medications   furosemide (LASIX) 20 MG tablet   captopril (CAPOTEN) 12.5 MG tablet   Other Relevant Orders   Lipid panel (Completed)   Vitamin B12 deficiency    Check b12.      Relevant Orders   Vitamin B12 (Completed)   Anxiety   Prediabetes   Relevant Orders   Hemoglobin A1c (Completed)   Hypokalemia    Increase potassium chloride 20 meq 2 oral three times a day       Moderate recurrent major depression ([place])    Management per specialist.  Recommend continue medications.      Other Visit Diagnoses     Polyuria       Relevant Orders   POCT URINALYSIS DIP (CLINITEK) (Completed)     .  Meds ordered this encounter  Medications   furosemide (LASIX) 20 MG tablet    Sig: Take 1 tablet (20 mg total) by mouth 2 (two) times daily.    Dispense:  180 tablet    Refill:  1   captopril (CAPOTEN) 12.5 MG tablet     Sig: Take 1 tablet (12.5 mg total) by mouth 3 (three) times daily.    Dispense:  270 tablet    Refill:  1    Orders Placed This Encounter  Procedures   Comprehensive metabolic panel   Lipid panel   Hemoglobin A1c   CBC with Differential/Platelet   Vitamin B12   VITAMIN D 25 Hydroxy (Vit-D Deficiency, Fractures)   Cardiovascular Risk Assessment   POCT URINALYSIS DIP (CLINITEK)     Follow-up: Return in about 3 months (around 11/12/2022) for chronic fasting.  An After Visit Summary was printed and given to the patient.  Rochel Brome, MD [facility] [phone]

## 2022-08-12 ENCOUNTER — Encounter: Payer: Self-pay | Admitting: Family Medicine

## 2022-08-12 ENCOUNTER — Ambulatory Visit (INDEPENDENT_AMBULATORY_CARE_PROVIDER_SITE_OTHER): Payer: Medicare Other | Admitting: Family Medicine

## 2022-08-12 VITALS — BP 110/70 | HR 71 | Temp 97.2°F | Resp 14 | Ht 64.0 in | Wt 182.0 lb

## 2022-08-12 DIAGNOSIS — F331 Major depressive disorder, recurrent, moderate: Secondary | ICD-10-CM | POA: Diagnosis not present

## 2022-08-12 DIAGNOSIS — M797 Fibromyalgia: Secondary | ICD-10-CM

## 2022-08-12 DIAGNOSIS — I10 Essential (primary) hypertension: Secondary | ICD-10-CM

## 2022-08-12 DIAGNOSIS — E039 Hypothyroidism, unspecified: Secondary | ICD-10-CM

## 2022-08-12 DIAGNOSIS — E782 Mixed hyperlipidemia: Secondary | ICD-10-CM

## 2022-08-12 DIAGNOSIS — K2270 Barrett's esophagus without dysplasia: Secondary | ICD-10-CM

## 2022-08-12 DIAGNOSIS — E538 Deficiency of other specified B group vitamins: Secondary | ICD-10-CM | POA: Diagnosis not present

## 2022-08-12 DIAGNOSIS — F988 Other specified behavioral and emotional disorders with onset usually occurring in childhood and adolescence: Secondary | ICD-10-CM

## 2022-08-12 DIAGNOSIS — E559 Vitamin D deficiency, unspecified: Secondary | ICD-10-CM

## 2022-08-12 DIAGNOSIS — K760 Fatty (change of) liver, not elsewhere classified: Secondary | ICD-10-CM

## 2022-08-12 DIAGNOSIS — F419 Anxiety disorder, unspecified: Secondary | ICD-10-CM

## 2022-08-12 DIAGNOSIS — G4733 Obstructive sleep apnea (adult) (pediatric): Secondary | ICD-10-CM

## 2022-08-12 DIAGNOSIS — R7303 Prediabetes: Secondary | ICD-10-CM | POA: Diagnosis not present

## 2022-08-12 DIAGNOSIS — E876 Hypokalemia: Secondary | ICD-10-CM

## 2022-08-12 DIAGNOSIS — R3589 Other polyuria: Secondary | ICD-10-CM | POA: Diagnosis not present

## 2022-08-12 LAB — POCT URINALYSIS DIP (CLINITEK)
Bilirubin, UA: NEGATIVE
Blood, UA: NEGATIVE
Glucose, UA: NEGATIVE mg/dL
Ketones, POC UA: NEGATIVE mg/dL
Leukocytes, UA: NEGATIVE
Nitrite, UA: NEGATIVE
POC PROTEIN,UA: NEGATIVE
Spec Grav, UA: 1.01 (ref 1.010–1.025)
Urobilinogen, UA: 0.2 E.U./dL
pH, UA: 6.5 (ref 5.0–8.0)

## 2022-08-12 MED ORDER — FUROSEMIDE 20 MG PO TABS: 20.0000 mg | ORAL_TABLET | Freq: Two times a day (BID) | ORAL | 1 refills | Status: DC

## 2022-08-12 MED ORDER — CAPTOPRIL 12.5 MG PO TABS: 12.5000 mg | ORAL_TABLET | Freq: Three times a day (TID) | ORAL | 1 refills | Status: DC

## 2022-08-13 LAB — LIPID PANEL
Chol/HDL Ratio: 5.9 ratio — ABNORMAL HIGH (ref 0.0–4.4)
Cholesterol, Total: 360 mg/dL — ABNORMAL HIGH (ref 100–199)
HDL: 61 mg/dL (ref >39)
LDL Chol Calc (NIH): 254 mg/dL — ABNORMAL HIGH (ref 0–99)
Triglycerides: 220 mg/dL — ABNORMAL HIGH (ref 0–149)
VLDL Cholesterol Cal: 45 mg/dL — ABNORMAL HIGH (ref 5–40)

## 2022-08-13 LAB — CBC WITH DIFFERENTIAL/PLATELET
Basophils Absolute: 0 10*3/uL (ref 0.0–0.2)
Basos: 1 %
EOS (ABSOLUTE): 0.1 10*3/uL (ref 0.0–0.4)
Eos: 3 %
Hematocrit: 43.7 % (ref 34.0–46.6)
Hemoglobin: 14.2 g/dL (ref 11.1–15.9)
Immature Grans (Abs): 0 10*3/uL (ref 0.0–0.1)
Immature Granulocytes: 0 %
Lymphocytes Absolute: 1.4 10*3/uL (ref 0.7–3.1)
Lymphs: 33 %
MCH: 27.8 pg (ref 26.6–33.0)
MCHC: 32.5 g/dL (ref 31.5–35.7)
MCV: 86 fL (ref 79–97)
Monocytes Absolute: 0.4 10*3/uL (ref 0.1–0.9)
Monocytes: 10 %
Neutrophils Absolute: 2.3 10*3/uL (ref 1.4–7.0)
Neutrophils: 53 %
Platelets: 319 10*3/uL (ref 150–450)
RBC: 5.1 x10E6/uL (ref 3.77–5.28)
RDW: 13.7 % (ref 11.7–15.4)
WBC: 4.3 10*3/uL (ref 3.4–10.8)

## 2022-08-13 LAB — HEMOGLOBIN A1C
Est. average glucose Bld gHb Est-mCnc: 134 mg/dL
Hgb A1c MFr Bld: 6.3 % — ABNORMAL HIGH (ref 4.8–5.6)

## 2022-08-13 LAB — CARDIOVASCULAR RISK ASSESSMENT

## 2022-08-13 LAB — COMPREHENSIVE METABOLIC PANEL
ALT: 12 IU/L (ref 0–32)
AST: 18 IU/L (ref 0–40)
Albumin/Globulin Ratio: 1.9 (ref 1.2–2.2)
Albumin: 4.7 g/dL (ref 3.8–4.9)
Alkaline Phosphatase: 59 IU/L (ref 44–121)
BUN/Creatinine Ratio: 32 — ABNORMAL HIGH (ref 9–23)
BUN: 21 mg/dL (ref 6–24)
Bilirubin Total: 0.2 mg/dL (ref 0.0–1.2)
CO2: 30 mmol/L — ABNORMAL HIGH (ref 20–29)
Calcium: 9.9 mg/dL (ref 8.7–10.2)
Chloride: 94 mmol/L — ABNORMAL LOW (ref 96–106)
Creatinine, Ser: 0.65 mg/dL (ref 0.57–1.00)
Globulin, Total: 2.5 g/dL (ref 1.5–4.5)
Glucose: 103 mg/dL — ABNORMAL HIGH (ref 70–99)
Potassium: 3.1 mmol/L — ABNORMAL LOW (ref 3.5–5.2)
Sodium: 139 mmol/L (ref 134–144)
Total Protein: 7.2 g/dL (ref 6.0–8.5)
eGFR: 103 mL/min/{1.73_m2} (ref >59)

## 2022-08-13 LAB — VITAMIN D 25 HYDROXY (VIT D DEFICIENCY, FRACTURES): Vit D, 25-Hydroxy: 40.1 ng/mL (ref 30.0–100.0)

## 2022-08-13 LAB — VITAMIN B12: Vitamin B-12: 761 pg/mL (ref 232–1245)

## 2022-08-16 ENCOUNTER — Encounter: Payer: Self-pay | Admitting: Family Medicine

## 2022-08-16 ENCOUNTER — Other Ambulatory Visit: Payer: Medicare Other

## 2022-08-16 ENCOUNTER — Other Ambulatory Visit: Payer: Self-pay | Admitting: Family Medicine

## 2022-08-16 ENCOUNTER — Encounter
Payer: Medicare Other | Attending: Physical Medicine and Rehabilitation | Admitting: Physical Medicine and Rehabilitation

## 2022-08-16 ENCOUNTER — Encounter: Payer: Self-pay | Admitting: Physical Medicine and Rehabilitation

## 2022-08-16 VITALS — BP 174/105 | HR 83 | Ht 64.0 in | Wt 183.8 lb

## 2022-08-16 DIAGNOSIS — M75111 Incomplete rotator cuff tear or rupture of right shoulder, not specified as traumatic: Secondary | ICD-10-CM | POA: Diagnosis not present

## 2022-08-16 DIAGNOSIS — Z5181 Encounter for therapeutic drug level monitoring: Secondary | ICD-10-CM | POA: Diagnosis not present

## 2022-08-16 DIAGNOSIS — Z79891 Long term (current) use of opiate analgesic: Secondary | ICD-10-CM | POA: Diagnosis not present

## 2022-08-16 DIAGNOSIS — D8940 Mast cell activation, unspecified: Secondary | ICD-10-CM | POA: Diagnosis not present

## 2022-08-16 DIAGNOSIS — G894 Chronic pain syndrome: Secondary | ICD-10-CM | POA: Diagnosis not present

## 2022-08-16 DIAGNOSIS — M7918 Myalgia, other site: Secondary | ICD-10-CM

## 2022-08-16 DIAGNOSIS — E876 Hypokalemia: Secondary | ICD-10-CM

## 2022-08-16 LAB — COMPREHENSIVE METABOLIC PANEL
ALT: 14 IU/L (ref 0–32)
AST: 15 IU/L (ref 0–40)
Albumin/Globulin Ratio: 1.7 (ref 1.2–2.2)
Albumin: 4.6 g/dL (ref 3.8–4.9)
Alkaline Phosphatase: 62 IU/L (ref 44–121)
BUN/Creatinine Ratio: 23 (ref 9–23)
BUN: 18 mg/dL (ref 6–24)
Bilirubin Total: 0.4 mg/dL (ref 0.0–1.2)
CO2: 26 mmol/L (ref 20–29)
Calcium: 9.9 mg/dL (ref 8.7–10.2)
Chloride: 97 mmol/L (ref 96–106)
Creatinine, Ser: 0.77 mg/dL (ref 0.57–1.00)
Globulin, Total: 2.7 g/dL (ref 1.5–4.5)
Glucose: 92 mg/dL (ref 70–99)
Potassium: 4.2 mmol/L (ref 3.5–5.2)
Sodium: 139 mmol/L (ref 134–144)
Total Protein: 7.3 g/dL (ref 6.0–8.5)
eGFR: 90 mL/min/{1.73_m2} (ref >59)

## 2022-08-16 MED ORDER — LIDOCAINE HCL 1 % IJ SOLN: 3.0000 mL | Freq: Once | INTRAMUSCULAR | Status: DC

## 2022-08-16 MED ORDER — REPATHA 140 MG/ML ~~LOC~~ SOSY: 1.0000 mL | PREFILLED_SYRINGE | SUBCUTANEOUS | 1 refills | Status: DC

## 2022-08-16 MED ORDER — REPATHA 140 MG/ML ~~LOC~~ SOSY: 140.0000 mg | PREFILLED_SYRINGE | SUBCUTANEOUS | 0 refills | Status: DC

## 2022-08-16 MED ORDER — HYDROMORPHONE HCL 2 MG PO TABS: 1.0000 mg | ORAL_TABLET | Freq: Three times a day (TID) | ORAL | 0 refills | Status: DC | PRN

## 2022-08-16 NOTE — Patient Instructions (Addendum)
Plan: Stop Low dose Naltrexone immediately  2. Do Dilaudid 1-2 mg as needed  for flares u to 3x/day.  For 1 week, then refill as long as UDS OK.    3. Need to take magnesium- it's over the counter- 400 mg 2x/day- can cause loose stools.  To help keep potassium up as well as help muscle spasms.   4. UDS/opiate contract today.    5. Can send in Dilaudid for 1 week and then wait to refill until UDS has come back.   6. Patient here for trigger point injections for  Consent done and on chart.  Cleaned areas with alcohol and injected using a 27 gauge 1.5 inch needle  Injected  2.5cc- 0.5 cc wasted Using 1% Lidocaine with no EPI  Upper traps B/L  Levators Posterior scalenes Middle scalenes- B/L  Splenius Capitus Pectoralis Major Rhomboids- B/L x2 Infraspinatus Teres Major/minor Thoracic paraspinals Lumbar paraspinals Other injections-    Patient's level of pain prior was 8/10 Current level of pain after injections is- multiple twitch responses- is more relaxed   There was no bleeding or complications.  Patient was advised to drink a lot of water on day after injections to flush system Will have increased soreness for 12-48 hours after injections.  Can use Lidocaine patches the day AFTER injections Can use theracane on day of injections in places didn't inject Can use heating pad 4-6 hours AFTER injections   7. Has Rx for Xanax- uses rarely   8. F/U 6 weeks- for TrP injections and f/u on chronic pain  9. Refer to ortho for R RTC injury and R moderate to severe knee OA.

## 2022-08-16 NOTE — Progress Notes (Signed)
Pt is a 56 yr old female with hx of HTN, moderate persistent asthma; hypothyroidism- latest TSH 1.41, fibromyalgia, Migraines, and BMI 31; Told she has Kidney disease? Thinks due to swelling up so much- -  Latest BUN/Cr 22/0.77.  Has many allergies 30 meds- also has IBS Here for evaluation for pain.  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  Also has trochanteric bursitis.  Here for trigger point injections.     Increase in low dose naltrexone- keeps her mind racing- kept up all night.  Not sure is doing a lot for pain.   Everything hurts- BP 174/105.    Got Norco for cruise- hasn't taken much- wanted to scratch face off and couldn't sleep-   When first seen, asked about allergy issues- every time ate, heart was palpitating on cruise just went on.   Has issues with splenius capitus being so tight, cannot turn head, hurts really bad.  Since weather changed in last 2 days, pain has been AWFUL!!!! Cannot tolerate it- has Norco, but only took 1x/since made her feel so bad.   Had to take Xanax before came in here today, because scared of trigger point injections.    Prednisone makes suicidal and Methylprednisinolone is "amazing". Makes most of pain go away.    Plan: Stop Low dose Naltrexone immediately  2. Do Dilaudid 1-2 mg as needed  for flares u to 3x/day.  For 1 week, then refill as long as UDS OK.    3. Need to take magnesium- it's over the counter- 400 mg 2x/day- can cause loose stools.  To help keep potassium up as well as help muscle spasms.   4. UDS/opiate contract today.    5. Can send in Dilaudid for 1 week and then wait to refill until UDS has come back.   6. Patient here for trigger point injections for  Consent done and on chart.  Cleaned areas with alcohol and injected using a 27 gauge 1.5 inch needle  Injected  2.5cc- 0.5 cc wasted Using 1% Lidocaine with no EPI  Upper traps B/L  Levators Posterior scalenes Middle scalenes- B/L  Splenius  Capitus Pectoralis Major Rhomboids- B/L x2 Infraspinatus Teres Major/minor Thoracic paraspinals Lumbar paraspinals Other injections-    Patient's level of pain prior was 8/10 Current level of pain after injections is- multiple twitch responses- is more relaxed   There was no bleeding or complications.  Patient was advised to drink a lot of water on day after injections to flush system Will have increased soreness for 12-48 hours after injections.  Can use Lidocaine patches the day AFTER injections Can use theracane on day of injections in places didn't inject Can use heating pad 4-6 hours AFTER injections   7. Has Rx for Xanax- uses rarely   8. F/U 6 weeks- for TrP injections and f/u on chronic pain  9. Refer to ortho for R RTC injury and R moderate to severe knee OA.    I spent a total of  36  minutes on total care today- >50% coordination of care- due to 10 minutes on injections

## 2022-08-17 ENCOUNTER — Encounter: Payer: Self-pay | Admitting: Physical Medicine and Rehabilitation

## 2022-08-17 ENCOUNTER — Telehealth: Payer: Self-pay

## 2022-08-17 ENCOUNTER — Other Ambulatory Visit: Payer: Self-pay | Admitting: Family Medicine

## 2022-08-17 NOTE — Telephone Encounter (Signed)
Yes, Repatha patient assistance application filled out, will mail to patient.   Pattricia Boss, [place] Pharmacist Assistant [phone]

## 2022-08-17 NOTE — Chronic Care Management (AMB) (Signed)
Patient assistance application filled out for Repatha with [employer], mailing to patient to fill out and return to PCP office.

## 2022-08-19 ENCOUNTER — Other Ambulatory Visit: Payer: Self-pay

## 2022-08-19 DIAGNOSIS — I10 Essential (primary) hypertension: Secondary | ICD-10-CM

## 2022-08-19 LAB — TOXASSURE SELECT,+ANTIDEPR,UR

## 2022-08-19 MED ORDER — POTASSIUM CHLORIDE CRYS ER 20 MEQ PO TBCR: 40.0000 meq | EXTENDED_RELEASE_TABLET | Freq: Three times a day (TID) | ORAL | 1 refills | Status: DC

## 2022-08-21 NOTE — Assessment & Plan Note (Signed)
Well controlled.  No changes to medicines. Continue Captopril 12.5 mg TID, Chlorthalidone 25 mg daily, furosemide 20 mg 2 times a day. Continue to work on eating a healthy diet and exercise.  Labs drawn today.

## 2022-08-21 NOTE — Assessment & Plan Note (Signed)
Check b12. 

## 2022-08-21 NOTE — Assessment & Plan Note (Signed)
Management per specialist.  Recommend continue medications.

## 2022-08-21 NOTE — Assessment & Plan Note (Signed)
Increase potassium chloride 20 meq 2 oral three times a day

## 2022-08-21 NOTE — Assessment & Plan Note (Signed)
Restart repatha 140 mg every 2 weeks.

## 2022-08-21 NOTE — Assessment & Plan Note (Signed)
Continue tizanidine.

## 2022-08-21 NOTE — Assessment & Plan Note (Signed)
Check vit D level. 

## 2022-08-21 NOTE — Assessment & Plan Note (Signed)
Check thyroid levels.  The current medical regimen is effective;  continue present plan and medications.

## 2022-08-23 ENCOUNTER — Telehealth: Payer: Self-pay | Admitting: *Deleted

## 2022-08-23 NOTE — Telephone Encounter (Signed)
Urine drug screen for this encounter is consistent for prescribed medication 

## 2022-08-25 ENCOUNTER — Telehealth: Payer: Self-pay

## 2022-08-25 ENCOUNTER — Ambulatory Visit (INDEPENDENT_AMBULATORY_CARE_PROVIDER_SITE_OTHER): Payer: Medicare Other

## 2022-08-25 ENCOUNTER — Ambulatory Visit: Payer: Medicare Other | Admitting: Orthopedic Surgery

## 2022-08-25 DIAGNOSIS — G8929 Other chronic pain: Secondary | ICD-10-CM

## 2022-08-25 DIAGNOSIS — M1711 Unilateral primary osteoarthritis, right knee: Secondary | ICD-10-CM

## 2022-08-25 DIAGNOSIS — M25561 Pain in right knee: Secondary | ICD-10-CM | POA: Diagnosis not present

## 2022-08-25 DIAGNOSIS — M25511 Pain in right shoulder: Secondary | ICD-10-CM | POA: Diagnosis not present

## 2022-08-25 NOTE — Telephone Encounter (Signed)
Can you please change order to [address]

## 2022-08-26 NOTE — Telephone Encounter (Signed)
Order changed.

## 2022-08-28 ENCOUNTER — Encounter: Payer: Self-pay | Admitting: Orthopedic Surgery

## 2022-08-28 MED ORDER — BUPIVACAINE HCL 0.25 % IJ SOLN
4.0000 mL | INTRAMUSCULAR | Status: AC | PRN
  Administered 2022-08-25: 4 mL via INTRA_ARTICULAR

## 2022-08-28 MED ORDER — LIDOCAINE HCL 1 % IJ SOLN
5.0000 mL | INTRAMUSCULAR | Status: AC | PRN
  Administered 2022-08-25: 5 mL

## 2022-08-28 MED ORDER — METHYLPREDNISOLONE ACETATE 40 MG/ML IJ SUSP
40.0000 mg | INTRAMUSCULAR | Status: AC | PRN
  Administered 2022-08-25: 40 mg via INTRA_ARTICULAR

## 2022-08-28 NOTE — Progress Notes (Signed)
Office Visit Note   Patient: Laura [name]           Date of Birth: 1966/07/08           MRN: [id number] Visit Date: 08/25/2022 Requested by: Courtney Heys, [address] [place],  [place] [id number] PCP: Rochel Brome, MD  Subjective: Chief Complaint  Patient presents with   Right Shoulder - Pain   Right Knee - Pain    HPI: Laura [name] is a 56 y.o. female who presents to the office reporting right shoulder pain and right knee pain.  The patient has a history of several injuries and pain in the shoulder for the past 2 months.  She is right-hand dominant.  Denies any numbness and tingling or neck pain affecting the right-hand side.  Does report of the shoulder aching.  Does wake her from sleep at night.  Hard for her to pick things up.  The pain wakes her from sleep every night.  Denies any mechanical symptoms in the shoulder.  She does physical type work.  Patient also reports right knee pain.  She states "I have arthritis".  She did have prior right knee arthroscopy for meniscal pathology 3 years ago.  This was done in [place].  She has tried gel injections and cortisone shots after surgery without relief.  She did have a very bad experience with a right knee injection in the past which was primarily anterior injection.  She describes pain with every step in the right knee.  Does report some popping.  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  Cannot really hunt or hike which she likes to do..                ROS: All systems reviewed are negative as they relate to the chief complaint within the history of present illness.  Patient denies fevers or chills.  Assessment & Plan: Visit Diagnoses:  1. Right shoulder pain, unspecified chronicity   2. Chronic pain of right knee     Plan: Impression is right shoulder pain with probable rotator cuff tear based on history and exam.  Does not look like she has frozen shoulder.  Plan MRI  arthrogram right shoulder with no overhead lifting and no floor to waist lifting more than 20 pounds for the next 4 weeks.  Regarding the right knee and injection is performed in the knee today.  This was a more positive experience for her than has been in the past.  I think she is heading for knee replacement at sometime in the future.  Is developing a knee flexion contracture.  Follow-up after her scan and we can evaluate and make a surgical plan likely for the right shoulder and see how she did with the right knee injection.  Follow-Up Instructions: No follow-ups on file.   Orders:  Orders Placed This Encounter  Procedures   XR Shoulder Right   XR KNEE 3 VIEW RIGHT   Arthrogram   MR SHOULDER RIGHT W CONTRAST   No orders of the defined types were placed in this encounter.     Procedures: Large Joint Inj: R knee on 08/25/2022 3:37 PM Indications: diagnostic evaluation, joint swelling and pain Details: 18 G 1.5 in needle, superolateral approach  Arthrogram: No  Medications: 5 mL lidocaine 1 %; 40 mg methylPREDNISolone acetate 40 MG/ML; 4 mL bupivacaine 0.25 % Outcome: tolerated well, no immediate complications Procedure, treatment alternatives,  risks and benefits explained, specific risks discussed. Consent was given by the patient. Immediately prior to procedure a time out was called to verify the correct patient, procedure, equipment, support staff and site/side marked as required. Patient was prepped and draped in the usual sterile fashion.       Clinical Data: No additional findings.  Objective: Vital Signs: There were no vitals taken for this visit.  Physical Exam:  Constitutional: Patient appears well-developed HEENT:  Head: Normocephalic Eyes:EOM are normal Neck: Normal range of motion Cardiovascular: Normal rate Pulmonary/chest: Effort normal Neurologic: Patient is alert Skin: Skin is warm Psychiatric: Patient has normal mood and affect  Ortho Exam: Ortho exam  demonstrates range of motion on the right shoulder of 75/85/120.  She does have some coarseness with internal/external rotation of that right shoulder at 90 degrees of abduction.  Pretty good subscap strength.  External rotation strength limited by some pain.  No discrete AC joint tenderness is present.  No other masses lymphadenopathy or skin changes noted in the right shoulder region.  Right knee exam demonstrates range of motion of 10-1 10.  No effusion.  Collateral and cruciate ligaments are stable.  Has medial greater than lateral joint line tenderness.  No masses lymphadenopathy or skin changes noted in the right knee region.  Pedal pulses intact.  Ankle dorsiflexion intact.  Specialty Comments:  No specialty comments available.  Imaging: No results found.   PMFS History: Patient Active Problem List   Diagnosis Date Noted   Primary osteoarthritis of right knee 07/06/2022   Myofascial pain dysfunction syndrome 05/24/2022   Moderate recurrent major depression ([place]) 04/19/2022   Unexplained weight gain 01/11/2022   Allergic rhinitis due to allergen 12/08/2021   Xerostomia 12/06/2021   Hypokalemia 11/28/2021   Moderate persistent asthma with exacerbation 11/28/2021   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   BMI 27.0-27.9,adult 05/01/2021   ADD (attention deficit disorder) 05/01/2021   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/12/2020   Fatty liver 08/11/2017   OSA on CPAP 08/11/2017   Renal atrophy, right 07/18/2017   Recurrent UTI 05/10/2017   Barrett's esophagus 11/02/2016   Memory impairment 10/18/2016   Chronic pain syndrome 10/12/2016   Chronic low back pain (Location of Tertiary source of pain) (Bilateral) (L>R) 10/12/2016   Lumbar facet joint syndrome 10/12/2016   Chronic sacroiliac joint pain 10/12/2016   Chronic shoulder pain (Location of Primary Source of Pain) (Bilateral) (R>L) 10/12/2016   Chronic neck pain (Location of Secondary source of pain) (Bilateral) (R>L) 10/12/2016    Chronic upper back pain (Bilateral) (L>R) 10/12/2016   Chronic hand pain (Bilateral) (R>L) 10/12/2016   Chronic hand pain (Right) 10/12/2016   Chronic hand pain (Left) 10/12/2016   Chronic knee pain (Right) 10/12/2016   CKD (chronic kidney disease) 10/11/2016   Vitamin B12 deficiency 07/26/2016   Anxiety 07/06/2016   Depression 07/06/2016   Raynaud disease 05/06/2016   Fibromyalgia 02/13/2016   Prediabetes 12/23/2015   Heart palpitations 12/16/2015   Heart murmur 12/16/2015   Mixed hyperlipidemia 11/19/2015   Osteoarthrosis, generalized, multiple joints 06/06/2015   Vaginal enterocele    Urinary retention with incomplete bladder emptying    Obesity, Class I, BMI 30.0-34.9 (see actual BMI)    Rectocele    Cystocele    Hypothyroidism, acquired 12/03/2014   Essential hypertension, benign 12/03/2014   Vitamin D insufficiency 12/03/2014   Mild intermittent asthma 12/03/2014   Insomnia 08/31/2013   Past Medical History:  Diagnosis Date   Acute GI bleeding 09/01/2019   Anxiety and  depression    Bowel obstruction ([place])    Chest pain    a. 01/2016 Ex MV: Hypertensive response. Freq PVCs w/ exercise. nl EF. No ST/T changes. No ischemia.   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 03/2016 Echo: EF 60-65%, no rwma, mild MR, nl LA size, nl RV fxn.   Herpes zoster without complication 83/38/2505   High cholesterol    Kidney stones    Nasal septal perforation 05/12/2018   Hx of cocaine use   Palpitations    a. 03/2016 Holter: Sinus rhythm, avg HR 83, max 123, min 64. 4 PACs. 10,356 isolated PVCs, one vent couplet, 3842 V bigeminy, 4 beats NSVT->prev on BB - dc 2/2 swelling.   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   Prediabetes 12/23/2015   Overview:  Hba1c higher but not diabetic. Took metformin to try to lessen   Raynaud disease    Rectocele    Shingles    Sleep apnea    "mild"  per pt   Status post hysterectomy 03/08/2017   Torn rotator cuff    left   Urinary retention with incomplete bladder emptying    Vaginal dryness, menopausal    Vaginal enterocele    Vitamin D deficiency 12/03/2014   Wears hearing aid in both ears     Family History  Problem Relation Age of Onset   Stroke Father    Diabetes Father    Breast cancer Mother 53   Diverticulitis Mother    Esophageal cancer Maternal Grandfather    Colon cancer Paternal Grandmother    Suicidality Brother    Ovarian cancer Neg Hx    Stomach cancer Neg Hx     Past Surgical History:  Procedure Laterality Date   40 HOUR [place] STUDY N/A 10/11/2018   Procedure: 24 HOUR PH STUDY;  Surgeon: Mauri Pole, MD;  Location: [facility];  Service: Endoscopy;  Laterality: N/A;   ABDOMINAL HYSTERECTOMY     ANKLE SURGERY     ran over by mother in car by [place]  09/02/2019   Procedure: BIOPSY;  Surgeon: Rush Landmark Telford Nab., MD;  Location: [facility];  Service: Gastroenterology;;   COLONOSCOPY     COLONOSCOPY WITH PROPOFOL N/A 09/02/2019   Procedure: COLONOSCOPY WITH PROPOFOL;  Surgeon: Irving Copas., MD;  Location: [facility];  Service: Gastroenterology;  Laterality: N/A;   COLPORRHAPHY  2015   posterior and enterocele ligation   CYSTOSCOPY  04/11/2017   Procedure: CYSTOSCOPY;  Surgeon: Defrancesco, Alanda Slim, MD;  Location: [facility];  Service: Gynecology;;   ESOPHAGEAL MANOMETRY N/A 10/11/2018   Procedure: ESOPHAGEAL MANOMETRY (EM);  Surgeon: Mauri Pole, MD;  Location: [facility];  Service: Endoscopy;  Laterality: N/A;   ESOPHAGOGASTRODUODENOSCOPY (EGD) WITH PROPOFOL N/A 09/02/2019   Procedure: ESOPHAGOGASTRODUODENOSCOPY (EGD) WITH PROPOFOL;  Surgeon: Rush Landmark Telford Nab., MD;  Location: [facility];  Service: Gastroenterology;  Laterality: N/A;   EXTRACORPOREAL SHOCK WAVE LITHOTRIPSY Left 07/31/2020   Procedure: EXTRACORPOREAL SHOCK WAVE LITHOTRIPSY  (ESWL);  Surgeon: Billey Co, MD;  Location: [facility];  Service: Urology;  Laterality: Left;   KNEE ARTHROSCOPY WITH MEDIAL MENISECTOMY Right 02/04/2020   Procedure: KNEE ARTHROSCOPY WITH PARTIAL LATERAL AND MEDIAL MENISECTOMY,  PARTIAL SYNOVECTOMY AND CHONDROPLASTY;  Surgeon: Lovell Sheehan, MD;  Location: [facility];  Service: Orthopedics;  Laterality: Right;   LAPAROSCOPIC SALPINGO OOPHERECTOMY Left 04/11/2017   Procedure: LAPAROSCOPIC LEFT SALPINGO OOPHORECTOMY;  Surgeon: Brayton Mars, MD;  Location: [facility];  Service: Gynecology;  Laterality: Left;   LITHOTRIPSY     OOPHORECTOMY     PARTIAL HYSTERECTOMY     [place] IMPEDANCE STUDY N/A 10/11/2018   Procedure: [place] IMPEDANCE STUDY;  Surgeon: Mauri Pole, MD;  [facility]: [facility];  Service: Endoscopy;  Laterality: N/A;   PVC ABLATION N/A 01/18/2020   Procedure: PVC ABLATION;  Surgeon: Thompson Grayer, MD;  Location: [facility];  Service: Cardiovascular;  Laterality: N/A;   thumb surgery     UPPER GASTROINTESTINAL ENDOSCOPY     Social History   Occupational History   Occupation: Disabled  Tobacco Use   Smoking status: Former    Types: Cigarettes    Quit date: 04/08/1995    Years since quitting: 27.4   Smokeless tobacco: Never  Vaping Use   Vaping Use: Never used  Substance and Sexual Activity   Alcohol use: Yes    Alcohol/week: 1.0 standard drink of alcohol    Types: 1 Glasses of wine per week    Comment: rare; once every 6 months   Drug use: No   Sexual activity: Not Currently    Partners: Male    Birth control/protection: Surgical

## 2022-09-01 ENCOUNTER — Other Ambulatory Visit: Payer: Self-pay | Admitting: Nurse Practitioner

## 2022-09-01 ENCOUNTER — Other Ambulatory Visit: Payer: Medicare Other

## 2022-09-01 ENCOUNTER — Telehealth: Payer: Self-pay | Admitting: Orthopedic Surgery

## 2022-09-01 DIAGNOSIS — E876 Hypokalemia: Secondary | ICD-10-CM | POA: Diagnosis not present

## 2022-09-01 NOTE — Telephone Encounter (Addendum)
Patient called advised she is having an MRI on 09/08/2022 and is in a great deal of pain. Patient said she had to leave work yesterday. Patient asked if she can get in as soon as possible to go over the MRI? Patient also asked for a call with the results of her MRI.  The number to contact patient is [phone]

## 2022-09-02 LAB — COMPREHENSIVE METABOLIC PANEL
ALT: 14 IU/L (ref 0–32)
AST: 19 IU/L (ref 0–40)
Albumin/Globulin Ratio: 2 (ref 1.2–2.2)
Albumin: 5 g/dL — ABNORMAL HIGH (ref 3.8–4.9)
Alkaline Phosphatase: 66 IU/L (ref 44–121)
BUN/Creatinine Ratio: 25 — ABNORMAL HIGH (ref 9–23)
BUN: 20 mg/dL (ref 6–24)
Bilirubin Total: 0.4 mg/dL (ref 0.0–1.2)
CO2: 30 mmol/L — ABNORMAL HIGH (ref 20–29)
Calcium: 10.4 mg/dL — ABNORMAL HIGH (ref 8.7–10.2)
Chloride: 94 mmol/L — ABNORMAL LOW (ref 96–106)
Creatinine, Ser: 0.79 mg/dL (ref 0.57–1.00)
Globulin, Total: 2.5 g/dL (ref 1.5–4.5)
Glucose: 105 mg/dL — ABNORMAL HIGH (ref 70–99)
Potassium: 3.7 mmol/L (ref 3.5–5.2)
Sodium: 139 mmol/L (ref 134–144)
Total Protein: 7.5 g/dL (ref 6.0–8.5)
eGFR: 88 mL/min/{1.73_m2} (ref >59)

## 2022-09-04 NOTE — Telephone Encounter (Signed)
Sure thing after scan

## 2022-09-06 NOTE — Telephone Encounter (Signed)
Patient aware and will call us after sh has scan

## 2022-09-08 ENCOUNTER — Ambulatory Visit
Admission: RE | Admit: 2022-09-08 | Discharge: 2022-09-08 | Disposition: A | Discharge status: Home / Self Care | Payer: Medicare Other | Source: Ambulatory Visit | Attending: Orthopedic Surgery | Admitting: Orthopedic Surgery

## 2022-09-08 DIAGNOSIS — M25511 Pain in right shoulder: Secondary | ICD-10-CM

## 2022-09-08 DIAGNOSIS — S46011A Strain of muscle(s) and tendon(s) of the rotator cuff of right shoulder, initial encounter: Secondary | ICD-10-CM | POA: Diagnosis not present

## 2022-09-08 MED ORDER — IOPAMIDOL (ISOVUE-M 200) INJECTION 41%
20.0000 mL | Freq: Once | INTRAMUSCULAR | Status: AC
  Administered 2022-09-08: 20 mL via INTRA_ARTICULAR

## 2022-09-09 ENCOUNTER — Telehealth: Payer: Self-pay | Admitting: Orthopedic Surgery

## 2022-09-09 NOTE — Telephone Encounter (Signed)
Pt called let Dr Marlou Sa know her results are back. Please call pt at [phone].

## 2022-09-10 ENCOUNTER — Ambulatory Visit: Payer: Medicare Other | Admitting: Physical Medicine and Rehabilitation

## 2022-09-13 NOTE — Telephone Encounter (Signed)
I called her about her results

## 2022-09-13 NOTE — Progress Notes (Signed)
I called can you put blue sheet on desk thx

## 2022-09-15 ENCOUNTER — Telehealth: Payer: Self-pay | Admitting: Orthopedic Surgery

## 2022-09-15 NOTE — Telephone Encounter (Signed)
Pt called in stating that she had spoke with Dr. Marlou Sa on Monday... Pt stated that he advised that someone will call pt to schedule surgery... Pt stated that she has not heard from anyone yet... Pt requesting callback.Marland KitchenMarland Kitchen

## 2022-09-17 ENCOUNTER — Telehealth: Payer: Self-pay

## 2022-09-17 NOTE — Chronic Care Management (AMB) (Signed)
Faxed 2024 re-enrollment form to [employer] for [place].  Pattricia Boss, [place] Pharmacist Assistant [phone]

## 2022-09-21 ENCOUNTER — Encounter: Payer: Self-pay | Admitting: Legal Medicine

## 2022-09-21 ENCOUNTER — Ambulatory Visit (INDEPENDENT_AMBULATORY_CARE_PROVIDER_SITE_OTHER): Payer: Medicare Other | Admitting: Legal Medicine

## 2022-09-21 VITALS — BP 138/70 | HR 93 | Temp 97.3°F | Resp 18 | Ht 64.0 in | Wt 183.4 lb

## 2022-09-21 DIAGNOSIS — L280 Lichen simplex chronicus: Secondary | ICD-10-CM

## 2022-09-21 DIAGNOSIS — B3731 Acute candidiasis of vulva and vagina: Secondary | ICD-10-CM | POA: Diagnosis not present

## 2022-09-21 DIAGNOSIS — J0100 Acute maxillary sinusitis, unspecified: Secondary | ICD-10-CM | POA: Diagnosis not present

## 2022-09-21 MED ORDER — CLOTRIMAZOLE-BETAMETHASONE 1-0.05 % EX CREA: 1.0000 | TOPICAL_CREAM | Freq: Two times a day (BID) | CUTANEOUS | 0 refills | Status: DC

## 2022-09-21 MED ORDER — FLUCONAZOLE 150 MG PO TABS: 150.0000 mg | ORAL_TABLET | Freq: Once | ORAL | 0 refills | Status: AC

## 2022-09-21 MED ORDER — AMOXICILLIN-POT CLAVULANATE 875-125 MG PO TABS: 1.0000 | ORAL_TABLET | Freq: Two times a day (BID) | ORAL | 0 refills | Status: DC

## 2022-09-21 NOTE — Progress Notes (Signed)
Acute Office Visit  Subjective:    Patient ID: Laura [name], female    DOB: Jan 12, 1966, 56 y.o.   MRN: [id number]  Chief Complaint  Patient presents with   Cough   Rash   Sinus Problem    HPI: Patient is in today for cough and sinus pressure and patient states that its not getting better and the rash has been ongoing for years. She can take prednisolone. She has sinus pain for one week. Patient has recurrent dry rash. She has seen dermatology but does not remember what he told her.  Past Medical History:  Diagnosis Date   Acute GI bleeding 09/01/2019   Anxiety and depression    Bowel obstruction ([place])    Chest pain    a. 01/2016 Ex MV: Hypertensive response. Freq PVCs w/ exercise. nl EF. No ST/T changes. No ischemia.   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 03/2016 Echo: EF 60-65%, no rwma, mild MR, nl LA size, nl RV fxn.   Herpes zoster without complication 42/59/5638   High cholesterol    Kidney stones    Nasal septal perforation 05/12/2018   Hx of cocaine use   Palpitations    a. 03/2016 Holter: Sinus rhythm, avg HR 83, max 123, min 64. 4 PACs. 10,356 isolated PVCs, one vent couplet, 3842 V bigeminy, 4 beats NSVT->prev on BB - dc 2/2 swelling.   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   Prediabetes 12/23/2015   Overview:  Hba1c higher but not diabetic. Took metformin to try to lessen   Raynaud disease    Rectocele    Shingles    Sleep apnea    "mild" per pt   Status post hysterectomy 03/08/2017   Torn rotator cuff    left   Urinary retention with incomplete bladder emptying    Vaginal dryness, menopausal    Vaginal enterocele    Vitamin D deficiency 12/03/2014   Wears hearing aid in both ears     Past Surgical History:  Procedure Laterality Date   36 HOUR [place] STUDY N/A 10/11/2018   Procedure: [place];  Surgeon: Mauri Pole, MD;  Location: [facility];  Service: Endoscopy;  Laterality: N/A;   ABDOMINAL HYSTERECTOMY     ANKLE SURGERY     ran over by mother in car by [place]  09/02/2019   Procedure: BIOPSY;  Surgeon: Rush Landmark Telford Nab., MD;  Location: [place];  Service: Gastroenterology;;   COLONOSCOPY     COLONOSCOPY WITH PROPOFOL N/A 09/02/2019   Procedure: COLONOSCOPY WITH PROPOFOL;  Surgeon: Irving Copas., MD;  Location: [place];  Service: Gastroenterology;  Laterality: N/A;   COLPORRHAPHY  2015   posterior and enterocele ligation   CYSTOSCOPY  04/11/2017   Procedure: CYSTOSCOPY;  Surgeon: Defrancesco, Alanda Slim, MD;  Location: [facility];  Service: Gynecology;;   ESOPHAGEAL MANOMETRY N/A 10/11/2018   Procedure: ESOPHAGEAL MANOMETRY (EM);  Surgeon: Mauri Pole, MD;  Location: [facility];  Service: Endoscopy;  Laterality: N/A;   ESOPHAGOGASTRODUODENOSCOPY (EGD) WITH PROPOFOL N/A 09/02/2019   Procedure: ESOPHAGOGASTRODUODENOSCOPY (EGD) WITH PROPOFOL;  Surgeon: Rush Landmark Telford Nab., MD;  Location: [place];  Service: Gastroenterology;  Laterality: N/A;   EXTRACORPOREAL SHOCK WAVE LITHOTRIPSY Left 07/31/2020   Procedure: EXTRACORPOREAL SHOCK WAVE LITHOTRIPSY (ESWL);  Surgeon: Billey Co, MD;  Location: [facility];  Service: Urology;  Laterality: Left;   KNEE ARTHROSCOPY WITH MEDIAL MENISECTOMY Right 02/04/2020   Procedure: KNEE ARTHROSCOPY WITH PARTIAL LATERAL AND MEDIAL MENISECTOMY,  PARTIAL SYNOVECTOMY AND CHONDROPLASTY;  Surgeon: Lovell Sheehan, MD;  Location: [place];  Service: Orthopedics;  Laterality: Right;   LAPAROSCOPIC SALPINGO OOPHERECTOMY Left 04/11/2017   Procedure: LAPAROSCOPIC LEFT SALPINGO OOPHORECTOMY;  Surgeon: Brayton Mars, MD;  Location: [facility];  Service: Gynecology;  Laterality: Left;   LITHOTRIPSY     OOPHORECTOMY     PARTIAL HYSTERECTOMY     [place] IMPEDANCE STUDY N/A 10/11/2018   Procedure: [place] IMPEDANCE STUDY;  Surgeon:  Mauri Pole, MD;  Location: [facility];  Service: Endoscopy;  Laterality: N/A;   PVC ABLATION N/A 01/18/2020   Procedure: PVC ABLATION;  Surgeon: Thompson Grayer, MD;  Location: [facility];  Service: Cardiovascular;  Laterality: N/A;   thumb surgery     UPPER GASTROINTESTINAL ENDOSCOPY      Family History  Problem Relation Age of Onset   Stroke Father    Diabetes Father    Breast cancer Mother 21   Diverticulitis Mother    Esophageal cancer Maternal Grandfather    Colon cancer Paternal Grandmother    Suicidality Brother    Ovarian cancer Neg Hx    Stomach cancer Neg Hx     Social History   Socioeconomic History   Marital status: Single    Spouse name: Not on file   Number of children: 4   Years of education: Not on file   Highest education level: Some college, no degree  Occupational History   Occupation: Disabled  Tobacco Use   Smoking status: Former    Types: Cigarettes    Quit date: 04/08/1995    Years since quitting: 27.4   Smokeless tobacco: Never  Vaping Use   Vaping Use: Never used  Substance and Sexual Activity   Alcohol use: Yes    Alcohol/week: 1.0 standard drink of alcohol    Types: 1 Glasses of wine per week    Comment: rare; once every 6 months   Drug use: No   Sexual activity: Not Currently    Partners: Male    Birth control/protection: Surgical  Other Topics Concern   Not on file  Social History Narrative   Separated - 1 son and 1 daughter   Disabled    1 caffeine/day   Past smoker   No EtOH, drugs      08/02/2018      Social Determinants of Health   Financial Resource Strain: Low Risk  (08/05/2021)   Overall Financial Resource Strain (CARDIA)    Difficulty of Paying Living Expenses: Not hard at all  Food Insecurity: No Food Insecurity (08/05/2021)   Hunger Vital Sign    Worried About Running Out of Food in the Last Year: Never true    [place] in the Last Year: Never true  Transportation Needs: No Transportation  Needs (08/05/2021)   PRAPARE - Hydrologist (Medical): No    Lack of Transportation (Non-Medical): No  Physical Activity: Sufficiently Active (08/05/2021)   Exercise Vital Sign    Days of Exercise per Week: 6 days    Minutes of Exercise per Session: 60 min  Stress: No Stress Concern Present (08/05/2021)   [place]    Feeling of Stress : Not at  all  Social Connections: Somewhat Isolated (10/13/2018)   Social Connection and Isolation Panel [NHANES]    Frequency of Communication with Friends and Family: More than three times a week    Frequency of Social Gatherings with Friends and Family: Once a week    Attends Religious Services: More than 4 times per year    Active Member of [employer] or Organizations: No    Attends Archivist Meetings: Never    Marital Status: Separated  Intimate Partner Violence: Not At Risk (08/05/2021)   Humiliation, Afraid, Rape, and Kick questionnaire    Fear of Current or Ex-Partner: No    Emotionally Abused: No    Physically Abused: No    Sexually Abused: No    Outpatient Medications Prior to Visit  Medication Sig Dispense Refill   albuterol (VENTOLIN HFA) 108 (90 Base) MCG/ACT inhaler Inhale 2 puffs into the lungs every 4 (four) hours as needed for wheezing or shortness of breath. 18 g 3   ALPRAZolam (XANAX) 1 MG tablet Take 1 mg by mouth 4 (four) times daily as needed for anxiety.     Blood Glucose Monitoring Suppl (ACCU-CHEK GUIDE) w/Device KIT 1 each by Does not apply route daily. 1 kit 0   captopril (CAPOTEN) 12.5 MG tablet Take 1 tablet (12.5 mg total) by mouth 3 (three) times daily. 270 tablet 1   chlorthalidone (HYGROTON) 25 MG tablet Take 1 tablet (25 mg total) by mouth daily. 90 tablet 1   EPINEPHrine 0.3 mg/0.3 mL IJ SOAJ injection Inject 0.3 mLs (0.3 mg total) into the muscle as needed for anaphylaxis. 1 each 2   Evolocumab (REPATHA) 140 MG/ML SOSY  Inject 1 mL into the skin every 14 (fourteen) days. 6 mL 1   furosemide (LASIX) 20 MG tablet Take 1 tablet (20 mg total) by mouth 2 (two) times daily. 180 tablet 1   glucose blood (ACCU-CHEK GUIDE) test strip 1 each by Other route daily in the afternoon. Use as instructed 100 each 12   HYDROmorphone (DILAUDID) 2 MG tablet Take 0.5-1 tablets (1-2 mg total) by mouth every 8 (eight) hours as needed for severe pain. 21 tablet 0   hydrOXYzine (VISTARIL) 100 MG capsule Take 1 capsule (100 mg total) by mouth 3 (three) times daily as needed for itching. 30 capsule 0   ipratropium-albuterol (DUONEB) 0.5-2.5 (3) MG/3ML SOLN Take 3 mLs by nebulization every 4 (four) hours as needed. (Patient taking differently: Take 3 mLs by nebulization every 4 (four) hours as needed (asthma).) 360 mL 2   levothyroxine (SYNTHROID) 112 MCG tablet Take 1 tablet (112 mcg total) by mouth daily. 90 tablet 1   meclizine (ANTIVERT) 25 MG tablet Take 1 tablet (25 mg total) by mouth 3 (three) times daily as needed for dizziness. 30 tablet 0   nystatin powder APPLY TO THE AFFECTED AREA BID     potassium chloride SA (KLOR-CON M) 20 MEQ tablet Take 2 tablets (40 mEq total) by mouth 3 (three) times daily. 360 tablet 1   SUMAtriptan (IMITREX) 25 MG tablet Take 1 tablet (25 mg total) by mouth every 2 (two) hours as needed for migraine. May repeat in 2 hours if headache persists or recurs. 10 tablet 3   tiZANidine (ZANAFLEX) 4 MG tablet Take 2 tablets (8 mg total) by mouth 2 (two) times daily. For muscle spasms 120 tablet 5   zolpidem (AMBIEN) 10 MG tablet Take 10 mg by mouth at bedtime.     buPROPion (WELLBUTRIN XL) 150 MG  24 hr tablet Take by mouth. (Patient not taking: Reported on 09/21/2022)     Evolocumab (REPATHA) 140 MG/ML SOSY Inject 140 mg into the skin every 14 (fourteen) days. 2 mL 0   Facility-Administered Medications Prior to Visit  Medication Dose Route Frequency Provider Last Rate Last Admin   lidocaine (XYLOCAINE) 1 % (with  pres) injection 3 mL  3 mL Other Once Lovorn, Megan, MD        Allergies  Allergen Reactions   Meperidine Hives   Prednisone     Severe high anxiety   Shellfish Allergy Shortness Of Breath and Swelling   Diltiazem Swelling   Singulair [Montelukast]     Swelling all over.    Zetia [Ezetimibe]     Swelling of face.    Acebutolol Swelling and Other (See Comments)    Other reaction(s): Unknown   Amlodipine     Swelling    Celebrex [Celecoxib] Swelling    Patient began taking for knee pain and started swelling (hands, feet, face).    Codeine Hives and Nausea And Vomiting   Dexlansoprazole Other (See Comments)    Abdominal pain   Dronedarone Swelling and Other (See Comments)   Duloxetine Hcl Other (See Comments)    Made pt feel crazy   Flecainide Swelling   Gabapentin Other (See Comments)    Makes her feel crazy    Hydralazine     tachycardia   Hydrocodone Other (See Comments)    Keeps patient awake.   Lisinopril     swelling   Losartan     Swelling    Metoprolol Swelling and Other (See Comments)   Mexiletine     Swelling - hands, legs, face   Mirtazapine Swelling   Omeprazole Other (See Comments)    Abdominal pain   Oxycodone Itching   Pregabalin Other (See Comments)    twitch   [place] [Milnacipran]     Depression   Sectral [Acebutolol Hcl] Swelling   Statins Other (See Comments)    Muscle pain   Toradol [Ketorolac Tromethamine] Itching   Tramadol     Unable to sleep, makes her itch   Valsartan     malaise, fatigue, swelling.    Review of Systems  Constitutional:  Negative for chills and fatigue.  HENT:  Positive for congestion and sinus pain. Negative for ear pain and sore throat.   Eyes:  Negative for visual disturbance.  Respiratory:  Positive for cough. Negative for shortness of breath.   Cardiovascular:  Negative for chest pain and leg swelling.  Gastrointestinal:  Negative for abdominal pain, constipation, diarrhea, nausea and vomiting.  Endocrine:  Negative.   Genitourinary:  Negative for dysuria and frequency.  Musculoskeletal:  Negative for arthralgias, back pain and myalgias.  Skin: Negative.   Neurological:  Negative for dizziness and headaches.  Hematological: Negative.   Psychiatric/Behavioral: Negative.         Objective:    Physical Exam Vitals reviewed.  Constitutional:      General: She is in acute distress.     Appearance: Normal appearance.  HENT:     Head: Normocephalic.     Right Ear: Tympanic membrane normal.     Left Ear: Tympanic membrane normal.     Nose: Congestion present.     Mouth/Throat:     Mouth: Mucous membranes are moist.     Pharynx: Oropharynx is clear.  Eyes:     Conjunctiva/sclera: Conjunctivae normal.     Pupils: Pupils are equal, round,  and reactive to light.  Cardiovascular:     Rate and Rhythm: Normal rate and regular rhythm.     Pulses: Normal pulses.     Heart sounds: Normal heart sounds. No murmur heard.    No gallop.  Pulmonary:     Effort: Pulmonary effort is normal. No respiratory distress.     Breath sounds: Normal breath sounds. No wheezing.  Abdominal:     General: Abdomen is flat. Bowel sounds are normal. There is no distension.     Palpations: Abdomen is soft.     Tenderness: There is no abdominal tenderness.  Musculoskeletal:        General: Normal range of motion.     Cervical back: Normal range of motion and neck supple.     Right lower leg: No edema.     Left lower leg: No edema.  Skin:    General: Skin is warm.     Capillary Refill: Capillary refill takes less than 2 seconds.     Comments: Dry skin and redness left anterior chest wall  Neurological:     General: No focal deficit present.     Mental Status: She is alert and oriented to person, place, and time. Mental status is at baseline.  Psychiatric:        Mood and Affect: Mood normal.        Thought Content: Thought content normal.     BP 138/70   Pulse 93   Temp (!) 97.3 F (36.3 C)   Resp 18    Ht _0  (1.626 m)   Wt 183 lb 6.4 oz (83.2 kg)   SpO2 98%   BMI 31.48 kg/m  Wt Readings from Last 3 Encounters:  09/21/22 183 lb 6.4 oz (83.2 kg)  08/16/22 183 lb 12.8 oz (83.4 kg)  08/12/22 182 lb (82.6 kg)    Health Maintenance Due  Topic Date Due   Medicare Annual Wellness (AWV)  Never done   Zoster Vaccines- Shingrix (1 of 2) Never done   COVID-19 Vaccine (5 - 2023-24 season) 07/02/2022    There are no preventive care reminders to display for this patient.   Lab Results  Component Value Date   TSH 1.410 05/11/2022   Lab Results  Component Value Date   WBC 4.3 08/12/2022   HGB 14.2 08/12/2022   HCT 43.7 08/12/2022   MCV 86 08/12/2022   PLT 319 08/12/2022   Lab Results  Component Value Date   NA 139 09/01/2022   K 3.7 09/01/2022   CO2 30 (H) 09/01/2022   GLUCOSE 105 (H) 09/01/2022   BUN 20 09/01/2022   CREATININE 0.79 09/01/2022   BILITOT 0.4 09/01/2022   ALKPHOS 66 09/01/2022   AST 19 09/01/2022   ALT 14 09/01/2022   PROT 7.5 09/01/2022   ALBUMIN 5.0 (H) 09/01/2022   CALCIUM 10.4 (H) 09/01/2022   ANIONGAP 9 10/29/2021   EGFR 88 09/01/2022   Lab Results  Component Value Date   CHOL 360 (H) 08/12/2022   Lab Results  Component Value Date   HDL 61 08/12/2022   Lab Results  Component Value Date   LDLCALC 254 (H) 08/12/2022   Lab Results  Component Value Date   TRIG 220 (H) 08/12/2022   Lab Results  Component Value Date   CHOLHDL 5.9 (H) 08/12/2022   Lab Results  Component Value Date   HGBA1C 6.3 (H) 08/12/2022       Assessment & Plan:   Problem List Items  Addressed This Visit   None Visit Diagnoses     Acute non-recurrent maxillary sinusitis    -  Primary   Relevant Medications   amoxicillin-clavulanate (AUGMENTIN) 875-125 MG tablet   fluconazole (DIFLUCAN) 150 MG tablet Recurrent sinusitis, treat with augmentin    Neurodermatitis       Relevant Medications   clotrimazole-betamethasone (LOTRISONE) cream Moisturize skin,  shower every other day, use cream    Vaginal candidosis       Relevant Medications   clotrimazole-betamethasone (LOTRISONE) cream   fluconazole (DIFLUCAN) 150 MG tablet Diflucan for 2nd vaginosis from antibiotic      Meds ordered this encounter  Medications   amoxicillin-clavulanate (AUGMENTIN) 875-125 MG tablet    Sig: Take 1 tablet by mouth 2 (two) times daily.    Dispense:  20 tablet    Refill:  0   clotrimazole-betamethasone (LOTRISONE) cream    Sig: Apply 1 Application topically 2 (two) times daily.    Dispense:  30 g    Refill:  0   fluconazole (DIFLUCAN) 150 MG tablet    Sig: Take 1 tablet (150 mg total) by mouth once for 1 dose.    Dispense:  3 tablet    Refill:  0    No orders of the defined types were placed in this encounter.    Follow-up: Return if symptoms worsen or fail to improve.  An After Visit Summary was printed and given to the patient.  Reinaldo Meeker, MD [facility] [phone]

## 2022-09-22 ENCOUNTER — Ambulatory Visit: Payer: Medicare Other | Admitting: Surgical

## 2022-09-22 DIAGNOSIS — M75101 Unspecified rotator cuff tear or rupture of right shoulder, not specified as traumatic: Secondary | ICD-10-CM

## 2022-09-24 ENCOUNTER — Encounter: Payer: Self-pay | Admitting: Orthopedic Surgery

## 2022-09-24 NOTE — Progress Notes (Signed)
Office Visit Note   Patient: Laura [name]           Date of Birth: [birth year]           MRN: [id number] Visit Date: 09/22/2022 Requested by: Rochel Brome, MD [place],   [id number] PCP: Rochel Brome, MD  Subjective: Chief Complaint  Patient presents with   Other     Scan review    HPI: Laura [name] is a 56 y.o. female who presents to the office for MRI review. Patient denies any changes in symptoms.  Continues to complain mainly of lateral right shoulder pain with radiation to the elbow.  Pain wakes her up at night.  She has weakness with trying to lift even small objects away from her body.  She denies any history of diabetes or smoking.  Does have history of prior cardiac ablation and is followed by [facility] cardiology in [place].  Has pain contract with Dr. Dagoberto Ligas; only medications that she can tolerate pain medicine wise is Dilaudid and morphine.  Has history of CKD.  MRI results revealed: MR SHOULDER RIGHT W CONTRAST  Result Date: 09/12/2022 CLINICAL DATA:  Right shoulder pain radiating down the arm. No known injury. EXAM: MRI OF THE RIGHT SHOULDER WITH CONTRAST TECHNIQUE: Multiplanar, multisequence MR imaging of the shoulder was performed following the administration of intra-articular contrast. CONTRAST:  See Injection Documentation. COMPARISON:  None Available. FINDINGS: Rotator cuff: High-grade partial-thickness articular surface tear of the supraspinatus tendon with a probable small full-thickness component anteriorly. Moderate tendinosis of the infraspinatus tendon. Teres minor tendon is intact. Subscapularis tendon is intact. Muscles: No muscle atrophy or edema. No intramuscular fluid collection or hematoma. Biceps Long Head: Mild tendinosis of the intra-articular portion of the long head of the biceps tendon. Acromioclavicular Joint: Moderate arthropathy of the acromioclavicular joint. Small amount of contrast in the subacromial/subdeltoid bursa.  Glenohumeral Joint: Intraarticular contrast distending the joint capsule. Normal glenohumeral ligaments. No chondral defect. Labrum: Small posterosuperior labral tear. Bones: No fracture or dislocation. No aggressive osseous lesion. Other: No fluid collection or hematoma. IMPRESSION: 1. High-grade partial-thickness articular surface tear of the supraspinatus tendon with a probable small full-thickness component anteriorly. 2. Moderate tendinosis of the infraspinatus tendon. 3. Mild tendinosis of the intra-articular portion of the long head of the biceps tendon. Electronically Signed   By: Kathreen Devoid M.D.   On: 09/12/2022 08:26                 ROS: All systems reviewed are negative as they relate to the chief complaint within the history of present illness.  Patient denies fevers or chills.  Assessment & Plan: Visit Diagnoses:  1. Tear of right rotator cuff, unspecified tear extent, unspecified whether traumatic     Plan: Laura [name] is a 56 y.o. female who presents to the office for review of right shoulder MRI scan.  Right shoulder MRI demonstrates partial-thickness articular surface tear of the supraspinatus with full-thickness component as well as tendinosis of the long head of the bicep.  There is also moderate arthropathy noted in the [facility] joint with fairly severe tenderness on exam today over the [facility] joint that almost causes her to fall to the floor.  These findings were reviewed with her and she previously spoke with Dr. Marlou Sa about 10 days ago about getting him scheduled for surgery.  Plan to posterior for right shoulder arthroscopy and arthroscopic distal clavicle excision with subsequent mini open bicep tenodesis  and rotator cuff repair.  Discussed the risks and benefits of the procedure including but not limited to the risk of nerve/blood vessel damage, shoulder stiffness, need for revision surgery in the future, infection, medical complication from surgery.  She understands there will be a  fairly lengthy period of recovery with resolution of seminormal function of the shoulder around 3 to 4 months and really no lifting at all with the arm until at least 6 weeks after surgery.  Follow-up with the office after procedure.  Follow-Up Instructions: No follow-ups on file.   Orders:  No orders of the defined types were placed in this encounter.  No orders of the defined types were placed in this encounter.     Procedures: No procedures performed   Clinical Data: No additional findings.  Objective: Vital Signs: There were no vitals taken for this visit.  Physical Exam:  Constitutional: Patient appears well-developed HEENT:  Head: Normocephalic Eyes:EOM are normal Neck: Normal range of motion Cardiovascular: Normal rate Pulmonary/chest: Effort normal Neurologic: Patient is alert Skin: Skin is warm Psychiatric: Patient has normal mood and affect  Ortho Exam: Ortho exam demonstrates right shoulder with 60 degrees X rotation, 120 degrees abduction, 160 degrees forward flexion passively and actively.  This compared with the left shoulder with 70 degrees X rotation, 120 degrees abduction, 180 degrees forward flexion.  External Tatian weakness of the right shoulder rated 4/5 relative to the left rated 5/5.  5/5 bilateral supraspinatus and subscapularis strength.  She has tenderness over the bicipital groove of the right shoulder with positive O'Brien sign.  Negative external rotation lag sign.  Negative Hornblower sign.  5/5 motor strength of bilateral grip strength, finger abduction, pronation/supination, bicep, tricep, deltoid.  Moderate to severe tenderness over the [facility] joint of the right shoulder  Specialty Comments:  No specialty comments available.  Imaging: No results found.   PMFS History: Patient Active Problem List   Diagnosis Date Noted   Primary osteoarthritis of right knee 07/06/2022   Myofascial pain dysfunction syndrome 05/24/2022   Moderate recurrent  major depression ([place]) 04/19/2022   Unexplained weight gain 01/11/2022   Allergic rhinitis due to allergen 12/08/2021   Xerostomia 12/06/2021   Hypokalemia 11/28/2021   Moderate persistent asthma with exacerbation 11/28/2021   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   BMI 27.0-27.9,adult 05/01/2021   ADD (attention deficit disorder) 05/01/2021   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/12/2020   Fatty liver 08/11/2017   OSA on CPAP 08/11/2017   Renal atrophy, right 07/18/2017   Recurrent UTI 05/10/2017   Barrett's esophagus 11/02/2016   Memory impairment 10/18/2016   Chronic pain syndrome 10/12/2016   Chronic low back pain (Location of Tertiary source of pain) (Bilateral) (L>R) 10/12/2016   Lumbar facet joint syndrome 10/12/2016   Chronic sacroiliac joint pain 10/12/2016   Chronic shoulder pain (Location of Primary Source of Pain) (Bilateral) (R>L) 10/12/2016   Chronic neck pain (Location of Secondary source of pain) (Bilateral) (R>L) 10/12/2016   Chronic upper back pain (Bilateral) (L>R) 10/12/2016   Chronic hand pain (Bilateral) (R>L) 10/12/2016   Chronic hand pain (Right) 10/12/2016   Chronic hand pain (Left) 10/12/2016   Chronic knee pain (Right) 10/12/2016   CKD (chronic kidney disease) 10/11/2016   Vitamin B12 deficiency 07/26/2016   Anxiety 07/06/2016   Depression 07/06/2016   Raynaud disease 05/06/2016   Fibromyalgia 02/13/2016   Prediabetes 12/23/2015   Heart palpitations 12/16/2015   Heart murmur 12/16/2015   Mixed hyperlipidemia 11/19/2015   Osteoarthrosis, generalized, multiple joints 06/06/2015  Vaginal enterocele    Urinary retention with incomplete bladder emptying    Obesity, Class I, BMI 30.0-34.9 (see actual BMI)    Rectocele    Cystocele    Hypothyroidism, acquired 12/03/2014   Essential hypertension, benign 12/03/2014   Vitamin D insufficiency 12/03/2014   Mild intermittent asthma 12/03/2014   Insomnia 08/31/2013   Past Medical History:  Diagnosis Date    Acute GI bleeding 09/01/2019   Anxiety and depression    Bowel obstruction ([place])    Chest pain    a. 01/2016 Ex MV: Hypertensive response. Freq PVCs w/ exercise. nl EF. No ST/T changes. No ischemia.   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 03/2016 Echo: EF 60-65%, no rwma, mild MR, nl LA size, nl RV fxn.   Herpes zoster without complication 51/76/1607   High cholesterol    Kidney stones    Nasal septal perforation 05/12/2018   Hx of cocaine use   Palpitations    a. 03/2016 Holter: Sinus rhythm, avg HR 83, max 123, min 64. 4 PACs. 10,356 isolated PVCs, one vent couplet, 3842 V bigeminy, 4 beats NSVT->prev on BB - dc 2/2 swelling.   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   Prediabetes 12/23/2015   Overview:  Hba1c higher but not diabetic. Took metformin to try to lessen   Raynaud disease    Rectocele    Shingles    Sleep apnea    "mild" per pt   Status post hysterectomy 03/08/2017   Torn rotator cuff    left   Urinary retention with incomplete bladder emptying    Vaginal dryness, menopausal    Vaginal enterocele    Vitamin D deficiency 12/03/2014   Wears hearing aid in both ears     Family History  Problem Relation Age of Onset   Stroke Father    Diabetes Father    Breast cancer Mother 15   Diverticulitis Mother    Esophageal cancer Maternal Grandfather    Colon cancer Paternal Grandmother    Suicidality Brother    Ovarian cancer Neg Hx    Stomach cancer Neg Hx     Past Surgical History:  Procedure Laterality Date   64 HOUR [place] STUDY N/A 10/11/2018   Procedure: 24 HOUR PH STUDY;  Surgeon: Mauri Pole, MD;  Location: [facility];  Service: Endoscopy;  Laterality: N/A;   ABDOMINAL HYSTERECTOMY     ANKLE SURGERY     ran over by mother in car by [place]  09/02/2019   Procedure: BIOPSY;  Surgeon: Rush Landmark Telford Nab., MD;  Location:  [facility];  Service: Gastroenterology;;   COLONOSCOPY     COLONOSCOPY WITH PROPOFOL N/A 09/02/2019   Procedure: COLONOSCOPY WITH PROPOFOL;  Surgeon: Irving Copas., MD;  Location: [facility];  Service: Gastroenterology;  Laterality: N/A;   COLPORRHAPHY  2015   posterior and enterocele ligation   CYSTOSCOPY  04/11/2017   Procedure: CYSTOSCOPY;  Surgeon: Defrancesco, Alanda Slim, MD;  Location: [facility];  Service: Gynecology;;   ESOPHAGEAL MANOMETRY N/A 10/11/2018   Procedure: ESOPHAGEAL MANOMETRY (EM);  Surgeon: Mauri Pole, MD;  Location: [facility];  Service: Endoscopy;  Laterality: N/A;   ESOPHAGOGASTRODUODENOSCOPY (EGD) WITH PROPOFOL N/A 09/02/2019   Procedure: ESOPHAGOGASTRODUODENOSCOPY (EGD) WITH PROPOFOL;  Surgeon: Rush Landmark Telford Nab., MD;  Location:  [facility];  Service: Gastroenterology;  Laterality: N/A;   EXTRACORPOREAL SHOCK WAVE LITHOTRIPSY Left 07/31/2020   Procedure: EXTRACORPOREAL SHOCK WAVE LITHOTRIPSY (ESWL);  Surgeon: Billey Co, MD;  Location: [facility];  Service: Urology;  Laterality: Left;   KNEE ARTHROSCOPY WITH MEDIAL MENISECTOMY Right 02/04/2020   Procedure: KNEE ARTHROSCOPY WITH PARTIAL LATERAL AND MEDIAL MENISECTOMY,  PARTIAL SYNOVECTOMY AND CHONDROPLASTY;  Surgeon: Lovell Sheehan, MD;  Location: [facility];  Service: Orthopedics;  Laterality: Right;   LAPAROSCOPIC SALPINGO OOPHERECTOMY Left 04/11/2017   Procedure: LAPAROSCOPIC LEFT SALPINGO OOPHORECTOMY;  Surgeon: Brayton Mars, MD;  Location: [facility];  Service: Gynecology;  Laterality: Left;   LITHOTRIPSY     OOPHORECTOMY     PARTIAL HYSTERECTOMY     [place] IMPEDANCE STUDY N/A 10/11/2018   Procedure: [place] IMPEDANCE STUDY;  Surgeon: Mauri Pole, MD;  Location: [facility];  Service: Endoscopy;  Laterality: N/A;   PVC ABLATION N/A 01/18/2020   Procedure: PVC ABLATION;  Surgeon: Thompson Grayer, MD;  Location: [facility];  Service: Cardiovascular;  Laterality: N/A;    thumb surgery     UPPER GASTROINTESTINAL ENDOSCOPY     Social History   Occupational History   Occupation: Disabled  Tobacco Use   Smoking status: Former    Types: Cigarettes    Quit date: 04/08/1995    Years since quitting: 27.4   Smokeless tobacco: Never  Vaping Use   Vaping Use: Never used  Substance and Sexual Activity   Alcohol use: Yes    Alcohol/week: 1.0 standard drink of alcohol    Types: 1 Glasses of wine per week    Comment: rare; once every 6 months   Drug use: No   Sexual activity: Not Currently    Partners: Male    Birth control/protection: Surgical

## 2022-09-27 ENCOUNTER — Telehealth: Payer: Self-pay

## 2022-09-27 NOTE — Telephone Encounter (Signed)
Patient just calling in to make [place] aware she is having shoulder surgery on 10/08/22, wanted to know what that means for her pain meds, advised whatever is prescribed she will be able to get refill for her pain meds from [place] once she finished the course the surgeon gives if any.

## 2022-09-28 ENCOUNTER — Ambulatory Visit: Payer: Medicare Other | Admitting: Allergy and Immunology

## 2022-09-28 ENCOUNTER — Telehealth: Payer: Self-pay

## 2022-09-28 VITALS — BP 130/82 | HR 87 | Temp 98.6°F | Resp 16 | Ht 64.5 in | Wt 182.6 lb

## 2022-09-28 DIAGNOSIS — D8940 Mast cell activation, unspecified: Secondary | ICD-10-CM

## 2022-09-28 DIAGNOSIS — T783XXD Angioneurotic edema, subsequent encounter: Secondary | ICD-10-CM

## 2022-09-28 DIAGNOSIS — T7800XA Anaphylactic reaction due to unspecified food, initial encounter: Secondary | ICD-10-CM
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
## 2022-09-28 NOTE — Telephone Encounter (Signed)
   Pre-operative Risk Assessment    Patient Name: Laura [name]  DOB: [birth year] MRN: [id number]      Request for Surgical Clearance    Procedure:   Right Shoulder arthroscopy   Date of Surgery:  Clearance TBD                                 Surgeon:  Anderson Malta, MD Surgeon's Group or Practice Name:  [facility] number:  [phone] Fax number:  [phone]   Type of Clearance Requested:   - Medical    Type of Anesthesia:   General anesthesia & Interscalene block   Additional requests/questions:   N/A  Job Founds T   09/28/2022, 9:41 AM

## 2022-09-28 NOTE — Patient Instructions (Addendum)
  1.  Return for skin testing  2.  Blood -tryptase, shellfish IgE panel, C4, alpha-gal panel  3.  24-hour urine -histamine metabolites  4.   5.   6.   7.   8.

## 2022-09-29 ENCOUNTER — Encounter: Payer: Medicare Other | Admitting: Physical Medicine and Rehabilitation

## 2022-09-29 NOTE — Telephone Encounter (Signed)
   Name: Laura [name]  DOB: April 04, 1966  MRN: [id number]  Primary Cardiologist: Kathlyn Sacramento, MD  Chart reviewed as part of pre-operative protocol coverage. Because of Fredrica A Sindoni's past medical history and time since last visit, she will require a follow-up in-office visit in order to better assess preoperative cardiovascular risk. Last seen 01/2021 and recommended for 1 year follow up with Dr. Fletcher Anon or APP.  Pre-op covering staff: - Please schedule appointment and call patient to inform them. If patient already had an upcoming appointment within acceptable timeframe, please add "pre-op clearance" to the appointment notes so provider is aware. - Please contact requesting surgeon's office via preferred method (i.e, phone, fax) to inform them of need for appointment prior to surgery.   Loel Dubonnet, NP  09/29/2022, 8:03 AM

## 2022-09-29 NOTE — Telephone Encounter (Signed)
Scheduled for OV 10/04/22 with Christell Faith, PA for preoperative clearance.   Loel Dubonnet, NP

## 2022-09-30 ENCOUNTER — Encounter: Payer: Self-pay | Admitting: Allergy and Immunology

## 2022-09-30 ENCOUNTER — Telehealth: Payer: Self-pay

## 2022-09-30 NOTE — Telephone Encounter (Signed)
-----   Message from Jiles Prows, MD sent at 09/30/2022  7:57 AM EST ----- Please let Alissia know that when she returns to this clinic for skin testing we can discuss her other diagnostic tests that need to be performed including some blood test and some urine test.
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
## 2022-09-30 NOTE — Telephone Encounter (Signed)
Called and informed patient of Dr. Bruna Potter message.

## 2022-10-04 ENCOUNTER — Encounter: Payer: Self-pay | Admitting: Family Medicine

## 2022-10-04 ENCOUNTER — Encounter: Payer: Self-pay | Admitting: Physician Assistant

## 2022-10-04 ENCOUNTER — Ambulatory Visit: Payer: Medicare Other | Attending: Physician Assistant | Admitting: Physician Assistant

## 2022-10-04 ENCOUNTER — Telehealth: Payer: Self-pay | Admitting: Orthopedic Surgery

## 2022-10-04 VITALS — BP 149/94 | HR 72 | Ht 65.0 in | Wt 181.8 lb

## 2022-10-04 DIAGNOSIS — E785 Hyperlipidemia, unspecified: Secondary | ICD-10-CM | POA: Diagnosis not present

## 2022-10-04 DIAGNOSIS — E7849 Other hyperlipidemia: Secondary | ICD-10-CM

## 2022-10-04 DIAGNOSIS — I10 Essential (primary) hypertension: Secondary | ICD-10-CM | POA: Diagnosis not present

## 2022-10-04 DIAGNOSIS — Z01810 Encounter for preprocedural cardiovascular examination: Secondary | ICD-10-CM

## 2022-10-04 DIAGNOSIS — I493 Ventricular premature depolarization: Secondary | ICD-10-CM | POA: Diagnosis not present

## 2022-10-04 MED ORDER — CAPTOPRIL 25 MG PO TABS: 25.0000 mg | ORAL_TABLET | Freq: Three times a day (TID) | ORAL | 3 refills | Status: DC

## 2022-10-04 NOTE — Telephone Encounter (Signed)
I called will do 23 hour

## 2022-10-04 NOTE — Patient Instructions (Addendum)
Medication Instructions:  Your physician has recommended you make the following change in your medication:   Captopril 25 mg three times a day  *If you need a refill on your cardiac medications before your next appointment, please call your pharmacy*   Lab Work: None  If you have labs (blood work) drawn today and your tests are completely normal, you will receive your results only by: [place] (if you have MyChart) OR A paper copy in the mail If you have any lab test that is abnormal or we need to change your treatment, we will call you to review the results.   Testing/Procedures: None   Follow-Up: At [facility], you and your health needs are our priority.  As part of our continuing mission to provide you with exceptional heart care, we have created designated Provider Care Teams.  These Care Teams include your primary Cardiologist (physician) and Advanced Practice Providers (APPs -  Physician Assistants and Nurse Practitioners) who all work together to provide you with the care you need, when you need it.   Your next appointment:   6 month(s)  The format for your next appointment:   In Person  Provider:   Kathlyn Sacramento, MD or Christell Faith, PA-C        Important Information About Sugar

## 2022-10-04 NOTE — Pre-Procedure Instructions (Addendum)
Surgical Instructions    Your procedure is scheduled on Friday, December 8th.  Report to [facility] Main Entrance "A" at 10:00 A.M., then check in with the Admitting office.  Call this number if you have problems the morning of surgery:  [phone]   If you have any questions prior to your surgery date call [phone]: Open Monday-Friday 8am-4pm    Remember:  Do not eat after midnight the night before your surgery  You may drink clear liquids until 9:00 a.m. the morning of your surgery.   Clear liquids allowed are: Water, Non-Citrus Juices (without pulp), Carbonated Beverages, Clear Tea, Black Coffee Only (NO MILK, CREAM OR POWDERED CREAMER of any kind), and Gatorade.  Patient Instructions   The day of surgery (if you have diabetes):  Drink ONE small 12 oz bottle of Gatorade G2 by 9:00 am the morning of surgery This bottle was given to you during your hospital  pre-op appointment visit.  Nothing else to drink after completing the  Small 12 oz bottle of Gatorade G2.         If you have questions, please contact your surgeon's office.     Take these medicines the morning of surgery with A SIP OF WATER  amoxicillin-clavulanate (AUGMENTIN)  levothyroxine (SYNTHROID)  tiZANidine (ZANAFLEX)   Take these medications AS NEEDED: Albuterol inhaler-Please bring all inhalers with you the day of surgery.  ipratropium-albuterol (DUONEB)  ALPRAZolam (XANAX)  HYDROmorphone (DILAUDID)  hydrOXYzine (VISTARIL)  SUMAtriptan (IMITREX)   As of today, STOP taking any Aspirin (unless otherwise instructed by your surgeon) Aleve, Naproxen, Ibuprofen, Motrin, Advil, Goody's, BC's, all herbal medications, fish oil, and all vitamins.   HOW TO MANAGE YOUR DIABETES BEFORE AND AFTER SURGERY  Why is it important to control my blood sugar before and after surgery? Improving blood sugar levels before and after surgery helps healing and can limit problems. A way of improving blood sugar control  is eating a healthy diet by:  Eating less sugar and carbohydrates  Increasing activity/exercise  Talking with your doctor about reaching your blood sugar goals High blood sugars (greater than 180 mg/dL) can raise your risk of infections and slow your recovery, so you will need to focus on controlling your diabetes during the weeks before surgery. Make sure that the doctor who takes care of your diabetes knows about your planned surgery including the date and location.  How do I manage my blood sugar before surgery? Check your blood sugar at least 4 times a day, starting 2 days before surgery, to make sure that the level is not too high or low.  Check your blood sugar the morning of your surgery when you wake up and every 2 hours until you get to the Short Stay unit.  If your blood sugar is less than 70 mg/dL, you will need to treat for low blood sugar: Do not take insulin. Treat a low blood sugar (less than 70 mg/dL) with  cup of clear juice (cranberry or apple), 4 glucose tablets, OR glucose gel. Recheck blood sugar in 15 minutes after treatment (to make sure it is greater than 70 mg/dL). If your blood sugar is not greater than 70 mg/dL on recheck, call [phone] for further instructions. Report your blood sugar to the short stay nurse when you get to Short Stay.  If you are admitted to the hospital after surgery: Your blood sugar will be checked by the staff and you will probably be given insulin after surgery (instead of oral  diabetes medicines) to make sure you have good blood sugar levels. The goal for blood sugar control after surgery is 80-180 mg/dL.                      Do NOT Smoke (Tobacco/Vaping) for 24 hours prior to your procedure.  If you use a CPAP at night, you may bring your mask/headgear for your overnight stay.   Contacts, glasses, piercing's, hearing aid's, dentures or partials may not be worn into surgery, please bring cases for these belongings.    For patients  admitted to the hospital, discharge time will be determined by your treatment team.   Patients discharged the day of surgery will not be allowed to drive home, and someone needs to stay with them for 24 hours.  SURGICAL WAITING ROOM VISITATION Patients having surgery or a procedure may have no more than 2 support people in the waiting area - these visitors may rotate.   Children under the age of 2 must have an adult with them who is not the patient. If the patient needs to stay at the hospital during part of their recovery, the visitor guidelines for inpatient rooms apply. Pre-op nurse will coordinate an appropriate time for 1 support person to accompany patient in pre-op.  This support person may not rotate.   Please refer to the [facility] website for the visitor guidelines for Inpatients (after your surgery is over and you are in a regular room).    Special instructions:   [place]- Preparing For Surgery  Before surgery, you can play an important role. Because skin is not sterile, your skin needs to be as free of germs as possible. You can reduce the number of germs on your skin by washing with CHG (chlorahexidine gluconate) Soap before surgery.  CHG is an antiseptic cleaner which kills germs and bonds with the skin to continue killing germs even after washing.    Oral Hygiene is also important to reduce your risk of infection.  Remember - BRUSH YOUR TEETH THE MORNING OF SURGERY WITH YOUR REGULAR TOOTHPASTE  Please do not use if you have an allergy to CHG or antibacterial soaps. If your skin becomes reddened/irritated stop using the CHG.  Do not shave (including legs and underarms) for at least 48 hours prior to first CHG shower. It is OK to shave your face.  Please follow these instructions carefully.   Shower the NIGHT BEFORE SURGERY and the MORNING OF SURGERY  If you chose to wash your hair, wash your hair first as usual with your normal shampoo.  After you shampoo, rinse your  hair and body thoroughly to remove the shampoo.  Use CHG Soap as you would any other liquid soap. You can apply CHG directly to the skin and wash gently with a scrungie or a clean washcloth.   Apply the CHG Soap to your body ONLY FROM THE NECK DOWN.  Do not use on open wounds or open sores. Avoid contact with your eyes, ears, mouth and genitals (private parts). Wash Face and genitals (private parts)  with your normal soap.   Wash thoroughly, paying special attention to the area where your surgery will be performed.  Thoroughly rinse your body with warm water from the neck down.  DO NOT shower/wash with your normal soap after using and rinsing off the CHG Soap.  Pat yourself dry with a CLEAN TOWEL.  Wear CLEAN PAJAMAS to bed the night before surgery  Place CLEAN SHEETS  on your bed the night before your surgery  DO NOT SLEEP WITH PETS.   Day of Surgery: Take a shower with CHG soap. Do not wear jewelry or makeup Do not wear lotions, powders, perfumes, or deodorant. Do not shave 48 hours prior to surgery.  Do not bring valuables to the hospital.  [facility] is not responsible for any belongings or valuables. Do not wear nail polish, gel polish, artificial nails, or any other type of covering on natural nails (fingers and toes) If you have artificial nails or gel coating that need to be removed by a nail salon, please have this removed prior to surgery. Artificial nails or gel coating may interfere with anesthesia's ability to adequately monitor your vital signs. Wear Clean/Comfortable clothing the morning of surgery Remember to brush your teeth WITH YOUR REGULAR TOOTHPASTE.   Please read over the following fact sheets that you were given.    If you received a COVID test during your pre-op visit  it is requested that you wear a mask when out in public, stay away from anyone that may not be feeling well and notify your surgeon if you develop symptoms. If you have been in contact with  anyone that has tested positive in the last 10 days please notify you surgeon.

## 2022-10-04 NOTE — Telephone Encounter (Signed)
Patient states she has questions about her surgery. Please call--[phone]

## 2022-10-05 ENCOUNTER — Other Ambulatory Visit: Payer: Self-pay

## 2022-10-05 ENCOUNTER — Encounter (HOSPITAL_COMMUNITY)
Admission: RE | Admit: 2022-10-05 | Discharge: 2022-10-05 | Disposition: A | Discharge status: Home / Self Care | Payer: Medicare Other | Source: Ambulatory Visit | Attending: Orthopedic Surgery | Admitting: Orthopedic Surgery

## 2022-10-05 ENCOUNTER — Encounter (HOSPITAL_COMMUNITY): Payer: Self-pay

## 2022-10-05 VITALS — BP 133/79 | HR 82 | Temp 97.7°F | Ht 65.0 in | Wt 184.2 lb

## 2022-10-05 DIAGNOSIS — M19011 Primary osteoarthritis, right shoulder: Secondary | ICD-10-CM | POA: Insufficient documentation

## 2022-10-05 DIAGNOSIS — J45909 Unspecified asthma, uncomplicated: Secondary | ICD-10-CM | POA: Diagnosis not present

## 2022-10-05 DIAGNOSIS — I493 Ventricular premature depolarization: Secondary | ICD-10-CM | POA: Insufficient documentation

## 2022-10-05 DIAGNOSIS — I10 Essential (primary) hypertension: Secondary | ICD-10-CM | POA: Diagnosis not present

## 2022-10-05 DIAGNOSIS — M75111 Incomplete rotator cuff tear or rupture of right shoulder, not specified as traumatic: Secondary | ICD-10-CM | POA: Insufficient documentation

## 2022-10-05 DIAGNOSIS — Z01818 Encounter for other preprocedural examination: Secondary | ICD-10-CM

## 2022-10-05 DIAGNOSIS — E119 Type 2 diabetes mellitus without complications: Secondary | ICD-10-CM | POA: Insufficient documentation

## 2022-10-05 DIAGNOSIS — I7300 Raynaud's syndrome without gangrene: Secondary | ICD-10-CM | POA: Insufficient documentation

## 2022-10-05 DIAGNOSIS — G4733 Obstructive sleep apnea (adult) (pediatric): Secondary | ICD-10-CM | POA: Diagnosis not present

## 2022-10-05 DIAGNOSIS — Z01812 Encounter for preprocedural laboratory examination: Secondary | ICD-10-CM | POA: Diagnosis not present

## 2022-10-05 DIAGNOSIS — Z87891 Personal history of nicotine dependence: Secondary | ICD-10-CM | POA: Diagnosis not present

## 2022-10-05 DIAGNOSIS — M797 Fibromyalgia: Secondary | ICD-10-CM | POA: Insufficient documentation

## 2022-10-05 DIAGNOSIS — K922 Gastrointestinal hemorrhage, unspecified: Secondary | ICD-10-CM | POA: Diagnosis not present

## 2022-10-05 HISTORY — DX: Myalgia, other site: M79.18

## 2022-10-05 HISTORY — DX: Personal history of other diseases of the digestive system: Z87.19

## 2022-10-05 LAB — CBC
HCT: 43.8 % (ref 36.0–46.0)
Hemoglobin: 14.5 g/dL (ref 12.0–15.0)
MCH: 28.2 pg (ref 26.0–34.0)
MCHC: 33.1 g/dL (ref 30.0–36.0)
MCV: 85.2 fL (ref 80.0–100.0)
Platelets: 322 10*3/uL (ref 150–400)
RBC: 5.14 MIL/uL — ABNORMAL HIGH (ref 3.87–5.11)
RDW: 14.6 % (ref 11.5–15.5)
WBC: 5.2 10*3/uL (ref 4.0–10.5)
nRBC: 0 % (ref 0.0–0.2)

## 2022-10-05 LAB — BASIC METABOLIC PANEL
Anion gap: 8 (ref 5–15)
BUN: 18 mg/dL (ref 6–20)
CO2: 29 mmol/L (ref 22–32)
Calcium: 10 mg/dL (ref 8.9–10.3)
Chloride: 102 mmol/L (ref 98–111)
Creatinine, Ser: 0.69 mg/dL (ref 0.44–1.00)
GFR, Estimated: >60 mL/min (ref >60)
Glucose, Bld: 110 mg/dL — ABNORMAL HIGH (ref 70–99)
Potassium: 4.1 mmol/L (ref 3.5–5.1)
Sodium: 139 mmol/L (ref 135–145)

## 2022-10-05 LAB — GLUCOSE, CAPILLARY: Glucose-Capillary: 145 mg/dL — ABNORMAL HIGH (ref 70–99)

## 2022-10-05 LAB — SURGICAL PCR SCREEN
MRSA, PCR: NEGATIVE
Staphylococcus aureus: POSITIVE — AB

## 2022-10-05 NOTE — Progress Notes (Signed)
PCP - Dr. Rochel Brome Cardiologist - Dr. Kathlyn Sacramento  PPM/ICD - denies   Chest x-ray - 10/29/21 EKG - 10/04/22 Stress Test - 07/05/18 ECHO - 05/31/22 Cardiac Cath - denies  Sleep Study - 03/14/20 mild OSA CPAP - denies  DM- Pre-diabetic Fasting Blood Sugar - 90-110 Checks Blood Sugar 1-2 times/week  Last dose of GLP1 agonist-  n/a   ASA/Blood Thinner Instructions: n/a   ERAS Protcol - yes PRE-SURGERY G2- given  COVID TEST- n/a   Anesthesia review: yes, cardiac hx  Patient denies shortness of breath, fever, cough and chest pain at PAT appointment   All instructions explained to the patient, with a verbal understanding of the material. Patient agrees to go over the instructions while at home for a better understanding. The opportunity to ask questions was provided.

## 2022-10-06 ENCOUNTER — Ambulatory Visit: Payer: Medicare Other | Admitting: Allergy and Immunology

## 2022-10-06 ENCOUNTER — Encounter: Payer: Self-pay | Admitting: Allergy and Immunology

## 2022-10-06 VITALS — BP 126/82 | HR 80 | Resp 16

## 2022-10-06 DIAGNOSIS — T7800XA Anaphylactic reaction due to unspecified food, initial encounter: Secondary | ICD-10-CM | POA: Diagnosis not present

## 2022-10-06 DIAGNOSIS — D8940 Mast cell activation, unspecified: Secondary | ICD-10-CM

## 2022-10-06 DIAGNOSIS — T783XXD Angioneurotic edema, subsequent encounter: Secondary | ICD-10-CM | POA: Diagnosis not present

## 2022-10-06 NOTE — Progress Notes (Signed)
Anesthesia Chart Review:  Case: [id number] Date/Time: 10/08/22 1146   Procedures:      RIGHT SHOULDER ARTHROSCOPY, DEBRIDEMENT, DISTAL CLAVICLE EXCISION, MINI OPEN ROTATOR CUFF TEAR REPAIR (Right)     BICEPS TENODESIS (Right)   Anesthesia type: General   Pre-op diagnosis: RIGHT ROTATOR CUFF TEAR, BICEPS TENDINITIS, ACROMIOCLAVICULAR OSTEOARTHRITIS   Location: [facility] OR ROOM 04 / [place] [facility]   Surgeons: Meredith Pel, MD       DISCUSSION: Patient is a 56 year old female scheduled for the above procedure.   History includes former smoker (quit 04/08/95), frequent PVCs (s/p ablation at [facility] 05/09/20), murmur (mild-moderate AR, mild MR/TR 05/31/22), hypercholesterolemia, HTN, asthma, OSA (does not use CPAP), DM2, GI bleed (08/2019), bowel obstruction, Raynaud's disease, fibromyalgia, complex ovarian cyst (s/p left oophorectomy 04/11/17), nasal septal perforation (documented 05/12/18). No significant CAD by 05/16/19 CCTA.  Preoperative cardiology evaluation on 10/04/22 by Christell Faith, PA-C, "Preoperative cardiac risk stratification: Patient is scheduled to undergo a right shoulder arthroscopy on 10/08/2022.  Per [facility] Activity Status Index, she can achieve >4 METs without cardiac limitation.  Per Revised Cardiac Risk Index, she is low risk for noncardiac surgery with an estimated rate of 0.9% for adverse cardiac event in the perioperative timeframe.  She is doing well from a cardiac perspective and is without symptoms of angina or decompensation.  Recent echo demonstrated preserved LV systolic function.  She may proceed with noncardiac surgery at an overall low risk without further cardiac testing or intervention."  Anesthesia team to evaluate on the day of surgery.   VS: BP 133/79   Pulse 82   Temp 36.5 C (Oral)   Ht _0  (1.651 m)   Wt 83.6 kg   SpO2 98%   BMI 30.65 kg/m   PROVIDERS: Rochel Brome, MD is PCP  Kathlyn Sacramento, MD is cardiologist. PVC ablation by Mauri Reading, MD on 05/08/20.  Murlean Iba, MD is nephrologist [facility], see Care Everywhere) Allena Katz, MD is allergist   LABS: Labs reviewed: Acceptable for surgery. and Labs reviewed: Repeat  A1c 6.3% 08/12/22. (all labs ordered are listed, but only abnormal results are displayed)  Labs Reviewed  SURGICAL PCR SCREEN - Abnormal; Notable for the following components:      Result Value   Staphylococcus aureus POSITIVE (*)    All other components within normal limits  GLUCOSE, CAPILLARY - Abnormal; Notable for the following components:   Glucose-Capillary 145 (*)    All other components within normal limits  CBC - Abnormal; Notable for the following components:   RBC 5.14 (*)    All other components within normal limits  BASIC METABOLIC PANEL - Abnormal; Notable for the following components:   Glucose, Bld 110 (*)    All other components within normal limits     IMAGES: MRI right shoulder 09/08/22: IMPRESSION: 1. High-grade partial-thickness articular surface tear of the supraspinatus tendon with a probable small full-thickness component anteriorly. 2. Moderate tendinosis of the infraspinatus tendon. 3. Mild tendinosis of the intra-articular portion of the long head of the biceps tendon.   CTA Chest 10/29/21: IMPRESSION: 1. No evidence of acute pulmonary embolus. 2. No evidence of active pulmonary disease.   EKG: 10/04/22: NSR. LAD. Cannot rule out anterior infarct, age undetermined.   CV: Echo 05/31/22 ([place]: Conclusions: 1.  Left ventricular size is normal.  There is normal global left ventricular contractility.  LVEF 60 to 65%.  The diastolic filling pattern indicates impaired relaxation.  No regional wall  motion abnormalities were noted. 2.  There is mild aortic valve sclerosis. 3.  There is mild to moderate aortic regurgitation. 4.  There is trace to mild mitral regurgitation. 5.  Mild tricuspid regurgitation present.   Long term ZioXT monitor 06/24/20-07/04/20: Sinus  rhythm. Rare NSAT.   EP Procedure 05/09/20 [facility]): CONCLUSION  Successful ablation of aortomitral continuity PVC    EP Procedure 01/18/20: CONCLUSIONS:  1. Sinus rhythm upon presentation without PVCs 2. Very rare PVCs with RBB inferior axis with R waves across the precordium observed with isuprel infusion.  No arrhythmias amenable to 3D mapping or ablation today. 3. No early apparent complications.     CT Coronary 05/16/19: IMPRESSION: 1. Coronary artery calcium score 0 Agatston units. This suggests low risk for future cardiac events. 2.  No significant coronary disease noted.   Past Medical History:  Diagnosis Date   Acute GI bleeding 09/01/2019   Anxiety and depression    Asthma    Bowel obstruction ([place])    Chest pain    a. 01/2016 Ex MV: Hypertensive response. Freq PVCs w/ exercise. nl EF. No ST/T changes. No ischemia.   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 03/2016 Echo: EF 60-65%, no rwma, mild MR, nl LA size, nl RV fxn.   Herpes zoster without complication 43/32/9518   High cholesterol    History of hiatal hernia    Hypertension    Kidney stones    Myofascial pain syndrome    Nasal septal perforation 05/12/2018   Hx of cocaine use   Palpitations    a. 03/2016 Holter: Sinus rhythm, avg HR 83, max 123, min 64. 4 PACs. 10,356 isolated PVCs, one vent couplet, 3842 V bigeminy, 4 beats NSVT->prev on BB - dc 2/2 swelling.   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 Took metformin to try to lessen   Raynaud disease    Rectocele    Shingles    Sleep apnea    "mild" per pt   Status post hysterectomy 03/08/2017   Torn rotator cuff    left   Urinary retention with incomplete bladder emptying    Vaginal dryness, menopausal    Vaginal enterocele    Vitamin D deficiency 12/03/2014    Wears hearing aid in both ears     Past Surgical History:  Procedure Laterality Date   67 HOUR [place] STUDY N/A 10/11/2018   Procedure: [place];  Surgeon: Mauri Pole, MD;  Location: [facility];  Service: Endoscopy;  Laterality: N/A;   ABDOMINAL HYSTERECTOMY     ANKLE SURGERY     ran over by mother in car by [place]  09/02/2019   Procedure: BIOPSY;  Surgeon: Rush Landmark Telford Nab., MD;  Location: [place];  Service: Gastroenterology;;   COLONOSCOPY     COLONOSCOPY WITH PROPOFOL N/A 09/02/2019   Procedure: COLONOSCOPY WITH PROPOFOL;  Surgeon: Irving Copas., MD;  Location: [place];  Service: Gastroenterology;  Laterality: N/A;   COLPORRHAPHY  2015   posterior and enterocele ligation   CYSTOSCOPY  04/11/2017   Procedure: CYSTOSCOPY;  Surgeon: Defrancesco, Alanda Slim, MD;  Location: [facility];  Service: Gynecology;;   ESOPHAGEAL MANOMETRY N/A 10/11/2018  Procedure: ESOPHAGEAL MANOMETRY (EM);  Surgeon: Mauri Pole, MD;  Location: [facility];  Service: Endoscopy;  Laterality: N/A;   ESOPHAGOGASTRODUODENOSCOPY (EGD) WITH PROPOFOL N/A 09/02/2019   Procedure: ESOPHAGOGASTRODUODENOSCOPY (EGD) WITH PROPOFOL;  Surgeon: Rush Landmark Telford Nab., MD;  Location: [place];  Service: Gastroenterology;  Laterality: N/A;   EXTRACORPOREAL SHOCK WAVE LITHOTRIPSY Left 07/31/2020   Procedure: EXTRACORPOREAL SHOCK WAVE LITHOTRIPSY (ESWL);  Surgeon: Billey Co, MD;  Location: [facility];  Service: Urology;  Laterality: Left;   KNEE ARTHROSCOPY WITH MEDIAL MENISECTOMY Right 02/04/2020   Procedure: KNEE ARTHROSCOPY WITH PARTIAL LATERAL AND MEDIAL MENISECTOMY,  PARTIAL SYNOVECTOMY AND CHONDROPLASTY;  Surgeon: Lovell Sheehan, MD;  Location: [place];  Service: Orthopedics;  Laterality: Right;   LAPAROSCOPIC SALPINGO OOPHERECTOMY Left 04/11/2017   Procedure: LAPAROSCOPIC LEFT SALPINGO OOPHORECTOMY;  Surgeon: Brayton Mars, MD;  Location: [facility];  Service: Gynecology;  Laterality: Left;   LITHOTRIPSY     OOPHORECTOMY     PARTIAL HYSTERECTOMY     [place] IMPEDANCE STUDY N/A 10/11/2018   Procedure: [place] IMPEDANCE STUDY;  Surgeon: Mauri Pole, MD;  [facility]: [facility];  Service: Endoscopy;  Laterality: N/A;   PVC ABLATION N/A 01/18/2020   Procedure: PVC ABLATION;  Surgeon: Thompson Grayer, MD;  Location: [place] [facility];  Service: Cardiovascular;  Laterality: N/A;   thumb surgery     TONSILLECTOMY     removed as a child   UPPER GASTROINTESTINAL ENDOSCOPY      MEDICATIONS:  albuterol (VENTOLIN HFA) 108 (90 Base) MCG/ACT inhaler   ALPRAZolam (XANAX) 1 MG tablet   Blood Glucose Monitoring Suppl (ACCU-CHEK GUIDE) w/Device KIT   captopril (CAPOTEN) 25 MG tablet   chlorthalidone (HYGROTON) 25 MG tablet   clotrimazole-betamethasone (LOTRISONE) cream   EPINEPHrine 0.3 mg/0.3 mL IJ SOAJ injection   Evolocumab (REPATHA) 140 MG/ML SOSY   furosemide (LASIX) 20 MG tablet   glucose blood (ACCU-CHEK GUIDE) test strip   HYDROmorphone (DILAUDID) 2 MG tablet   hydrOXYzine (VISTARIL) 100 MG capsule   ipratropium-albuterol (DUONEB) 0.5-2.5 (3) MG/3ML SOLN   levothyroxine (SYNTHROID) 112 MCG tablet   niacin (VITAMIN B3) 100 MG tablet   nystatin powder   potassium chloride SA (KLOR-CON M) 20 MEQ tablet   SUMAtriptan (IMITREX) 25 MG tablet   tiZANidine (ZANAFLEX) 4 MG tablet   zolpidem (AMBIEN) 10 MG tablet   No current facility-administered medications for this encounter.    Myra Gianotti, PA-C Surgical Short Stay/Anesthesiology [facility] Phone [phone] [facility] Phone [phone] 10/07/2022 10:15 AM

## 2022-10-07 ENCOUNTER — Ambulatory Visit: Payer: Medicare Other | Admitting: Allergy and Immunology

## 2022-10-07 NOTE — Progress Notes (Signed)
Laura [name] presents to this clinic to have skin testing performed.  She did not demonstrate any hypersensitivity against a screening panel of aeroallergens or foods.

## 2022-10-07 NOTE — Anesthesia Preprocedure Evaluation (Addendum)
Anesthesia Evaluation  Patient identified by MRN, date of birth, ID band Patient awake    Reviewed: Allergy & Precautions, NPO status , Patient's Chart, lab work & pertinent test results  Airway Mallampati: II  TM Distance: >3 FB Neck ROM: Full    Dental  (+) Teeth Intact, Dental Advisory Given, Chipped,    Pulmonary asthma , sleep apnea , former smoker   Pulmonary exam normal breath sounds clear to auscultation       Cardiovascular hypertension, Pt. on medications Normal cardiovascular exam+ Valvular Problems/Murmurs AI and MR  Rhythm:Regular Rate:Normal  Echo 05/31/22 ([place]: Conclusions: 1.  Left ventricular size is normal.  There is normal global left ventricular contractility.  LVEF 60 to 65%.  The diastolic filling pattern indicates impaired relaxation.  No regional wall motion abnormalities were noted. 2.  There is mild aortic valve sclerosis. 3.  There is mild to moderate aortic regurgitation. 4.  There is trace to mild mitral regurgitation. 5.  Mild tricuspid regurgitation present.    Neuro/Psych  PSYCHIATRIC DISORDERS Anxiety Depression     Neuromuscular disease    GI/Hepatic Neg liver ROS, hiatal hernia,,,  Endo/Other  Hypothyroidism    Renal/GU Renal InsufficiencyRenal disease     Musculoskeletal  (+) Arthritis ,  Fibromyalgia -RIGHT ROTATOR CUFF TEAR, BICEPS TENDINITIS, ACROMIOCLAVICULAR OSTEOARTHRITIS   Abdominal   Peds  Hematology negative hematology ROS (+)   Anesthesia Other Findings Day of surgery medications reviewed with the patient.  Reproductive/Obstetrics                             Anesthesia Physical Anesthesia Plan  ASA: 3  Anesthesia Plan: General   Post-op Pain Management: Tylenol PO (pre-op)* and Regional block*   Induction: Intravenous  PONV Risk Score and Plan: 3 and Midazolam, Dexamethasone and Ondansetron  Airway Management Planned: Oral  ETT  Additional Equipment:   Intra-op Plan:   Post-operative Plan: Extubation in OR  Informed Consent: I have reviewed the patients History and Physical, chart, labs and discussed the procedure including the risks, benefits and alternatives for the proposed anesthesia with the patient or authorized representative who has indicated his/her understanding and acceptance.     Dental advisory given  Plan Discussed with: CRNA  Anesthesia Plan Comments: (PAT note written 10/07/2022 by Myra Gianotti, PA-C.  )       Anesthesia Quick Evaluation

## 2022-10-07 NOTE — Addendum Note (Signed)
Addended by: Guy Franco on: 10/07/2022 10:38 AM   Modules accepted: Orders

## 2022-10-08 ENCOUNTER — Encounter: Payer: Self-pay | Admitting: Orthopedic Surgery

## 2022-10-08 ENCOUNTER — Encounter (HOSPITAL_COMMUNITY)
Admission: AD | Disposition: A | Discharge status: Home / Self Care | Payer: Self-pay | Source: Home / Self Care | Attending: Orthopedic Surgery

## 2022-10-08 ENCOUNTER — Other Ambulatory Visit: Payer: Self-pay

## 2022-10-08 ENCOUNTER — Inpatient Hospital Stay (HOSPITAL_COMMUNITY)
Admission: AD | Admit: 2022-10-08 | Discharge: 2022-10-11 | DRG: 502 | Disposition: A | Discharge status: Home / Self Care | Payer: Medicare Other | Attending: Orthopedic Surgery | Admitting: Orthopedic Surgery

## 2022-10-08 ENCOUNTER — Ambulatory Visit (HOSPITAL_COMMUNITY): Payer: Medicare Other | Admitting: Vascular Surgery

## 2022-10-08 ENCOUNTER — Encounter (HOSPITAL_COMMUNITY): Payer: Self-pay | Admitting: Orthopedic Surgery

## 2022-10-08 ENCOUNTER — Ambulatory Visit (HOSPITAL_BASED_OUTPATIENT_CLINIC_OR_DEPARTMENT_OTHER): Payer: Medicare Other | Admitting: Registered Nurse

## 2022-10-08 DIAGNOSIS — Z87442 Personal history of urinary calculi: Secondary | ICD-10-CM

## 2022-10-08 DIAGNOSIS — Z79899 Other long term (current) drug therapy: Secondary | ICD-10-CM | POA: Diagnosis not present

## 2022-10-08 DIAGNOSIS — Z888 Allergy status to other drugs, medicaments and biological substances status: Secondary | ICD-10-CM | POA: Diagnosis not present

## 2022-10-08 DIAGNOSIS — X58XXXA Exposure to other specified factors, initial encounter: Secondary | ICD-10-CM | POA: Diagnosis present

## 2022-10-08 DIAGNOSIS — I10 Essential (primary) hypertension: Secondary | ICD-10-CM | POA: Diagnosis present

## 2022-10-08 DIAGNOSIS — F419 Anxiety disorder, unspecified: Secondary | ICD-10-CM | POA: Diagnosis present

## 2022-10-08 DIAGNOSIS — Z87891 Personal history of nicotine dependence: Secondary | ICD-10-CM

## 2022-10-08 DIAGNOSIS — Z79890 Hormone replacement therapy (postmenopausal): Secondary | ICD-10-CM

## 2022-10-08 DIAGNOSIS — Z91013 Allergy to seafood: Secondary | ICD-10-CM | POA: Diagnosis not present

## 2022-10-08 DIAGNOSIS — Z9889 Other specified postprocedural states: Secondary | ICD-10-CM

## 2022-10-08 DIAGNOSIS — M19011 Primary osteoarthritis, right shoulder: Secondary | ICD-10-CM

## 2022-10-08 DIAGNOSIS — M797 Fibromyalgia: Secondary | ICD-10-CM | POA: Diagnosis not present

## 2022-10-08 DIAGNOSIS — Z885 Allergy status to narcotic agent status: Secondary | ICD-10-CM | POA: Diagnosis not present

## 2022-10-08 DIAGNOSIS — Z833 Family history of diabetes mellitus: Secondary | ICD-10-CM

## 2022-10-08 DIAGNOSIS — M75121 Complete rotator cuff tear or rupture of right shoulder, not specified as traumatic: Secondary | ICD-10-CM | POA: Diagnosis not present

## 2022-10-08 DIAGNOSIS — R7303 Prediabetes: Secondary | ICD-10-CM | POA: Diagnosis not present

## 2022-10-08 DIAGNOSIS — M7521 Bicipital tendinitis, right shoulder: Secondary | ICD-10-CM

## 2022-10-08 DIAGNOSIS — J45909 Unspecified asthma, uncomplicated: Secondary | ICD-10-CM | POA: Diagnosis present

## 2022-10-08 DIAGNOSIS — Z803 Family history of malignant neoplasm of breast: Secondary | ICD-10-CM

## 2022-10-08 DIAGNOSIS — Z974 Presence of external hearing-aid: Secondary | ICD-10-CM

## 2022-10-08 DIAGNOSIS — Z8616 Personal history of COVID-19: Secondary | ICD-10-CM | POA: Diagnosis not present

## 2022-10-08 DIAGNOSIS — Z01818 Encounter for other preprocedural examination: Secondary | ICD-10-CM

## 2022-10-08 DIAGNOSIS — M75101 Unspecified rotator cuff tear or rupture of right shoulder, not specified as traumatic: Secondary | ICD-10-CM

## 2022-10-08 DIAGNOSIS — G4730 Sleep apnea, unspecified: Secondary | ICD-10-CM | POA: Diagnosis not present

## 2022-10-08 DIAGNOSIS — D8940 Mast cell activation, unspecified: Secondary | ICD-10-CM | POA: Diagnosis not present

## 2022-10-08 DIAGNOSIS — E7800 Pure hypercholesterolemia, unspecified: Secondary | ICD-10-CM | POA: Diagnosis present

## 2022-10-08 DIAGNOSIS — Z90711 Acquired absence of uterus with remaining cervical stump: Secondary | ICD-10-CM

## 2022-10-08 DIAGNOSIS — Z823 Family history of stroke: Secondary | ICD-10-CM | POA: Diagnosis not present

## 2022-10-08 DIAGNOSIS — S43431A Superior glenoid labrum lesion of right shoulder, initial encounter: Principal | ICD-10-CM

## 2022-10-08 DIAGNOSIS — Z800 Family history of malignant neoplasm of digestive organs: Secondary | ICD-10-CM | POA: Diagnosis not present

## 2022-10-08 DIAGNOSIS — G8918 Other acute postprocedural pain: Secondary | ICD-10-CM | POA: Diagnosis not present

## 2022-10-08 HISTORY — PX: SHOULDER ARTHROSCOPY WITH OPEN ROTATOR CUFF REPAIR AND DISTAL CLAVICLE ACROMINECTOMY: SHX5683

## 2022-10-08 HISTORY — PX: BICEPT TENODESIS: SHX5116

## 2022-10-08 SURGERY — SHOULDER ARTHROSCOPY WITH OPEN ROTATOR CUFF REPAIR AND DISTAL CLAVICLE ACROMINECTOMY
Anesthesia: General | Site: Shoulder | Laterality: Right

## 2022-10-08 MED ORDER — MIDAZOLAM HCL 2 MG/2ML IJ SOLN
INTRAMUSCULAR | Status: DC | PRN
  Administered 2022-10-08: 2 mg via INTRAVENOUS

## 2022-10-08 MED ORDER — MIDAZOLAM HCL 2 MG/2ML IJ SOLN
INTRAMUSCULAR | Status: AC
  Administered 2022-10-08: 2 mg via INTRAVENOUS
  Filled 2022-10-08: qty 2

## 2022-10-08 MED ORDER — ACETAMINOPHEN 10 MG/ML IV SOLN
INTRAVENOUS | Status: AC
  Filled 2022-10-08: qty 100

## 2022-10-08 MED ORDER — CHLORTHALIDONE 25 MG PO TABS
25.0000 mg | ORAL_TABLET | Freq: Every day | ORAL | Status: DC
  Administered 2022-10-09 – 2022-10-11 (×3): 25 mg via ORAL
  Filled 2022-10-08 (×3): qty 1

## 2022-10-08 MED ORDER — DEXAMETHASONE SODIUM PHOSPHATE 10 MG/ML IJ SOLN
INTRAMUSCULAR | Status: DC | PRN
  Administered 2022-10-08: 5 mg via INTRAVENOUS

## 2022-10-08 MED ORDER — CAPTOPRIL 25 MG PO TABS
25.0000 mg | ORAL_TABLET | Freq: Three times a day (TID) | ORAL | Status: DC
  Administered 2022-10-08 – 2022-10-11 (×7): 25 mg via ORAL
  Filled 2022-10-08 (×10): qty 1

## 2022-10-08 MED ORDER — ALPRAZOLAM 0.5 MG PO TABS
1.0000 mg | ORAL_TABLET | Freq: Four times a day (QID) | ORAL | Status: DC | PRN
  Administered 2022-10-09 – 2022-10-10 (×3): 1 mg via ORAL
  Filled 2022-10-08 (×3): qty 2

## 2022-10-08 MED ORDER — HYDROMORPHONE HCL 1 MG/ML IJ SOLN
0.5000 mg | INTRAMUSCULAR | Status: DC | PRN
  Administered 2022-10-09 – 2022-10-11 (×7): 0.5 mg via INTRAVENOUS
  Filled 2022-10-08 (×9): qty 0.5

## 2022-10-08 MED ORDER — LEVOTHYROXINE SODIUM 112 MCG PO TABS
112.0000 ug | ORAL_TABLET | Freq: Every day | ORAL | Status: DC
  Administered 2022-10-09 – 2022-10-11 (×3): 112 ug via ORAL
  Filled 2022-10-08 (×3): qty 1

## 2022-10-08 MED ORDER — METHOCARBAMOL 500 MG PO TABS
ORAL_TABLET | ORAL | Status: AC
  Filled 2022-10-08: qty 1

## 2022-10-08 MED ORDER — BUPIVACAINE HCL (PF) 0.25 % IJ SOLN
INTRAMUSCULAR | Status: AC
  Filled 2022-10-08: qty 30

## 2022-10-08 MED ORDER — LACTATED RINGERS IV SOLN: INTRAVENOUS | Status: DC

## 2022-10-08 MED ORDER — ACETAMINOPHEN 10 MG/ML IV SOLN
INTRAVENOUS | Status: DC | PRN
  Administered 2022-10-08: 1000 mg via INTRAVENOUS

## 2022-10-08 MED ORDER — FENTANYL CITRATE (PF) 100 MCG/2ML IJ SOLN: 25.0000 ug | INTRAMUSCULAR | Status: DC | PRN

## 2022-10-08 MED ORDER — LABETALOL HCL 5 MG/ML IV SOLN
INTRAVENOUS | Status: DC | PRN
  Administered 2022-10-08: 5 mg via INTRAVENOUS

## 2022-10-08 MED ORDER — CAPTOPRIL 12.5 MG PO TABS
25.0000 mg | ORAL_TABLET | Freq: Three times a day (TID) | ORAL | Status: DC
  Filled 2022-10-08: qty 2
  Filled 2022-10-08 (×2): qty 1
  Filled 2022-10-08: qty 2

## 2022-10-08 MED ORDER — MIDAZOLAM HCL 2 MG/2ML IJ SOLN
INTRAMUSCULAR | Status: AC
  Filled 2022-10-08: qty 2

## 2022-10-08 MED ORDER — ACETAMINOPHEN 325 MG PO TABS: 325.0000 mg | ORAL_TABLET | Freq: Four times a day (QID) | ORAL | Status: DC | PRN

## 2022-10-08 MED ORDER — HYDROMORPHONE HCL 2 MG PO TABS
1.0000 mg | ORAL_TABLET | ORAL | Status: DC | PRN
  Administered 2022-10-08: 2 mg via ORAL
  Filled 2022-10-08: qty 1

## 2022-10-08 MED ORDER — BUPIVACAINE LIPOSOME 1.3 % IJ SUSP
INTRAMUSCULAR | Status: DC | PRN
  Administered 2022-10-08: 10 mL via PERINEURAL

## 2022-10-08 MED ORDER — ROCURONIUM BROMIDE 10 MG/ML (PF) SYRINGE
PREFILLED_SYRINGE | INTRAVENOUS | Status: DC | PRN
  Administered 2022-10-08: 10 mg via INTRAVENOUS
  Administered 2022-10-08: 60 mg via INTRAVENOUS

## 2022-10-08 MED ORDER — ALBUTEROL SULFATE (2.5 MG/3ML) 0.083% IN NEBU: 2.5000 mg | INHALATION_SOLUTION | RESPIRATORY_TRACT | Status: DC | PRN

## 2022-10-08 MED ORDER — SODIUM CHLORIDE (PF) 0.9 % IJ SOLN
INTRAMUSCULAR | Status: DC | PRN
  Administered 2022-10-08: 15 mL

## 2022-10-08 MED ORDER — DOCUSATE SODIUM 100 MG PO CAPS
100.0000 mg | ORAL_CAPSULE | Freq: Two times a day (BID) | ORAL | Status: DC
  Administered 2022-10-08 – 2022-10-11 (×5): 100 mg via ORAL
  Filled 2022-10-08 (×5): qty 1

## 2022-10-08 MED ORDER — ZOLPIDEM TARTRATE 5 MG PO TABS
5.0000 mg | ORAL_TABLET | Freq: Every day | ORAL | Status: DC
  Administered 2022-10-08 – 2022-10-10 (×3): 5 mg via ORAL
  Filled 2022-10-08 (×3): qty 1

## 2022-10-08 MED ORDER — FENTANYL CITRATE (PF) 250 MCG/5ML IJ SOLN
INTRAMUSCULAR | Status: DC | PRN
  Administered 2022-10-08: 100 ug via INTRAVENOUS
  Administered 2022-10-08: 50 ug via INTRAVENOUS

## 2022-10-08 MED ORDER — FENTANYL CITRATE (PF) 100 MCG/2ML IJ SOLN: 100.0000 ug | Freq: Once | INTRAMUSCULAR | Status: AC

## 2022-10-08 MED ORDER — CEFAZOLIN SODIUM-DEXTROSE 2-4 GM/100ML-% IV SOLN
2.0000 g | INTRAVENOUS | Status: AC
  Administered 2022-10-08: 2 g via INTRAVENOUS
  Filled 2022-10-08: qty 100

## 2022-10-08 MED ORDER — ONDANSETRON HCL 4 MG/2ML IJ SOLN: 4.0000 mg | Freq: Four times a day (QID) | INTRAMUSCULAR | Status: DC | PRN

## 2022-10-08 MED ORDER — LIDOCAINE 2% (20 MG/ML) 5 ML SYRINGE
INTRAMUSCULAR | Status: AC
  Filled 2022-10-08: qty 5

## 2022-10-08 MED ORDER — EPINEPHRINE PF 1 MG/ML IJ SOLN
INTRAMUSCULAR | Status: AC
  Filled 2022-10-08: qty 1

## 2022-10-08 MED ORDER — ALBUTEROL SULFATE HFA 108 (90 BASE) MCG/ACT IN AERS: 2.0000 | INHALATION_SPRAY | RESPIRATORY_TRACT | Status: DC | PRN

## 2022-10-08 MED ORDER — METOCLOPRAMIDE HCL 5 MG/ML IJ SOLN
5.0000 mg | Freq: Three times a day (TID) | INTRAMUSCULAR | Status: DC | PRN
  Filled 2022-10-08: qty 2

## 2022-10-08 MED ORDER — VANCOMYCIN HCL 1000 MG IV SOLR
INTRAVENOUS | Status: DC | PRN
  Administered 2022-10-08: 1000 mg via TOPICAL

## 2022-10-08 MED ORDER — LIDOCAINE 2% (20 MG/ML) 5 ML SYRINGE
INTRAMUSCULAR | Status: DC | PRN
  Administered 2022-10-08: 60 mg via INTRAVENOUS

## 2022-10-08 MED ORDER — EPINEPHRINE PF 1 MG/ML IJ SOLN
INTRAMUSCULAR | Status: DC | PRN
  Administered 2022-10-08: .15 mL

## 2022-10-08 MED ORDER — CHLORHEXIDINE GLUCONATE 0.12 % MT SOLN
15.0000 mL | Freq: Once | OROMUCOSAL | Status: AC
  Administered 2022-10-08: 15 mL via OROMUCOSAL
  Filled 2022-10-08: qty 15

## 2022-10-08 MED ORDER — VANCOMYCIN HCL 1000 MG IV SOLR
INTRAVENOUS | Status: AC
  Filled 2022-10-08: qty 20

## 2022-10-08 MED ORDER — DEXAMETHASONE SODIUM PHOSPHATE 10 MG/ML IJ SOLN
INTRAMUSCULAR | Status: AC
  Filled 2022-10-08: qty 1

## 2022-10-08 MED ORDER — TRANEXAMIC ACID-NACL 1000-0.7 MG/100ML-% IV SOLN
1000.0000 mg | INTRAVENOUS | Status: AC
  Administered 2022-10-08: 1000 mg via INTRAVENOUS
  Filled 2022-10-08: qty 100

## 2022-10-08 MED ORDER — HYDROMORPHONE HCL 2 MG PO TABS
1.0000 mg | ORAL_TABLET | ORAL | Status: DC | PRN
  Administered 2022-10-08 – 2022-10-11 (×14): 2 mg via ORAL
  Filled 2022-10-08 (×14): qty 1

## 2022-10-08 MED ORDER — MIDAZOLAM HCL 2 MG/2ML IJ SOLN: 2.0000 mg | Freq: Once | INTRAMUSCULAR | Status: AC

## 2022-10-08 MED ORDER — HYDROXYZINE PAMOATE 50 MG PO CAPS: 100.0000 mg | ORAL_CAPSULE | Freq: Three times a day (TID) | ORAL | Status: DC | PRN

## 2022-10-08 MED ORDER — LABETALOL HCL 5 MG/ML IV SOLN
INTRAVENOUS | Status: AC
  Filled 2022-10-08: qty 4

## 2022-10-08 MED ORDER — ACETAMINOPHEN 500 MG PO TABS
1000.0000 mg | ORAL_TABLET | Freq: Four times a day (QID) | ORAL | Status: AC
  Administered 2022-10-08 – 2022-10-09 (×2): 1000 mg via ORAL
  Filled 2022-10-08 (×3): qty 2

## 2022-10-08 MED ORDER — FENTANYL CITRATE (PF) 100 MCG/2ML IJ SOLN
INTRAMUSCULAR | Status: AC
  Administered 2022-10-08: 100 ug via INTRAVENOUS
  Filled 2022-10-08: qty 2

## 2022-10-08 MED ORDER — PROPOFOL 10 MG/ML IV BOLUS
INTRAVENOUS | Status: DC | PRN
  Administered 2022-10-08: 140 mg via INTRAVENOUS

## 2022-10-08 MED ORDER — BUPIVACAINE HCL (PF) 0.5 % IJ SOLN
INTRAMUSCULAR | Status: DC | PRN
  Administered 2022-10-08: 10 mL via PERINEURAL

## 2022-10-08 MED ORDER — CEFAZOLIN SODIUM-DEXTROSE 2-4 GM/100ML-% IV SOLN
2.0000 g | Freq: Three times a day (TID) | INTRAVENOUS | Status: AC
  Administered 2022-10-08 – 2022-10-09 (×3): 2 g via INTRAVENOUS
  Filled 2022-10-08 (×3): qty 100

## 2022-10-08 MED ORDER — ORAL CARE MOUTH RINSE: 15.0000 mL | Freq: Once | OROMUCOSAL | Status: AC

## 2022-10-08 MED ORDER — SUGAMMADEX SODIUM 200 MG/2ML IV SOLN
INTRAVENOUS | Status: DC | PRN
  Administered 2022-10-08: 200 mg via INTRAVENOUS

## 2022-10-08 MED ORDER — SODIUM CHLORIDE 0.9 % IV SOLN: INTRAVENOUS | Status: AC

## 2022-10-08 MED ORDER — ONDANSETRON HCL 4 MG PO TABS: 4.0000 mg | ORAL_TABLET | Freq: Four times a day (QID) | ORAL | Status: DC | PRN

## 2022-10-08 MED ORDER — ONDANSETRON HCL 4 MG/2ML IJ SOLN
INTRAMUSCULAR | Status: AC
  Filled 2022-10-08: qty 2

## 2022-10-08 MED ORDER — FENTANYL CITRATE (PF) 250 MCG/5ML IJ SOLN
INTRAMUSCULAR | Status: AC
  Filled 2022-10-08: qty 5

## 2022-10-08 MED ORDER — PROMETHAZINE HCL 25 MG/ML IJ SOLN: 6.2500 mg | INTRAMUSCULAR | Status: DC | PRN

## 2022-10-08 MED ORDER — METHOCARBAMOL 1000 MG/10ML IJ SOLN: 500.0000 mg | Freq: Four times a day (QID) | INTRAVENOUS | Status: DC | PRN

## 2022-10-08 MED ORDER — METHOCARBAMOL 500 MG PO TABS
500.0000 mg | ORAL_TABLET | Freq: Four times a day (QID) | ORAL | Status: DC | PRN
  Administered 2022-10-08: 500 mg via ORAL
  Filled 2022-10-08: qty 1

## 2022-10-08 MED ORDER — FUROSEMIDE 20 MG PO TABS
20.0000 mg | ORAL_TABLET | Freq: Two times a day (BID) | ORAL | Status: DC
  Administered 2022-10-08 – 2022-10-11 (×6): 20 mg via ORAL
  Filled 2022-10-08 (×6): qty 1

## 2022-10-08 MED ORDER — METOCLOPRAMIDE HCL 5 MG PO TABS: 5.0000 mg | ORAL_TABLET | Freq: Three times a day (TID) | ORAL | Status: DC | PRN

## 2022-10-08 MED ORDER — SODIUM CHLORIDE 0.9 % IR SOLN
Status: DC | PRN
  Administered 2022-10-08 (×2): 3001 mL

## 2022-10-08 MED ORDER — EPINEPHRINE PF 1 MG/ML IJ SOLN
INTRAMUSCULAR | Status: AC
  Filled 2022-10-08: qty 2

## 2022-10-08 MED ORDER — PHENYLEPHRINE HCL-NACL 20-0.9 MG/250ML-% IV SOLN
INTRAVENOUS | Status: DC | PRN
  Administered 2022-10-08: 10 ug/min via INTRAVENOUS

## 2022-10-08 SURGICAL SUPPLY — 78 items
AID PSTN UNV HD RSTRNT DISP (MISCELLANEOUS) ×1
ANCH BUTTON FIBERTAK KNEE SP (Anchor) IMPLANT
ANCH SUT 2 FBRTK KNTLS 1.8 (Anchor) ×1 IMPLANT
ANCH SUT 2 SWLK 19.1 CLS EYLT (Anchor) ×3 IMPLANT
ANCH SUT FBRTK 1.3 2 TPE (Anchor) ×2 IMPLANT
ANCH SUT FBRTK BTN KN (Anchor) ×1 IMPLANT
ANCHOR FBRTK 2.6 SUTURETAP 1.3 (Anchor) IMPLANT
ANCHOR SUT 1.8 FIBERTAK SB KL (Anchor) IMPLANT
ANCHOR SWIVELOCK BIO 4.75X19.1 (Anchor) IMPLANT
BAG COUNTER SPONGE SURGICOUNT (BAG) ×2 IMPLANT
BAG SPNG CNTER NS LX DISP (BAG) ×1
BLADE EXCALIBUR 4.0X13 (MISCELLANEOUS) ×2 IMPLANT
BLADE SURG 11 STRL SS (BLADE) ×2 IMPLANT
BURR OVAL 8 FLU 4.0X13 (MISCELLANEOUS) IMPLANT
COOLER ICEMAN CLASSIC (MISCELLANEOUS) IMPLANT
COVER SURGICAL LIGHT HANDLE (MISCELLANEOUS) ×2 IMPLANT
DRAPE INCISE IOBAN 66X45 STRL (DRAPES) ×4 IMPLANT
DRAPE STERI 35X30 U-POUCH (DRAPES) ×2 IMPLANT
DRAPE U-SHAPE 47X51 STRL (DRAPES) ×4 IMPLANT
DRSG TEGADERM 4X4.75 (GAUZE/BANDAGES/DRESSINGS) ×8 IMPLANT
DRSG XEROFORM 1X8 (GAUZE/BANDAGES/DRESSINGS) IMPLANT
DURAPREP 26ML APPLICATOR (WOUND CARE) ×2 IMPLANT
ELECT REM PT RETURN 9FT ADLT (ELECTROSURGICAL) ×1
ELECTRODE REM PT RTRN 9FT ADLT (ELECTROSURGICAL) ×2 IMPLANT
FILTER STRAW FLUID ASPIR (MISCELLANEOUS) ×2 IMPLANT
GAUZE PAD ABD 8X10 STRL (GAUZE/BANDAGES/DRESSINGS) ×6 IMPLANT
GAUZE SPONGE 4X4 12PLY STRL (GAUZE/BANDAGES/DRESSINGS) IMPLANT
GAUZE SPONGE 4X4 12PLY STRL LF (GAUZE/BANDAGES/DRESSINGS) ×2 IMPLANT
GAUZE XEROFORM 1X8 LF (GAUZE/BANDAGES/DRESSINGS) ×2 IMPLANT
GLOVE BIOGEL PI IND STRL 8 (GLOVE) ×2 IMPLANT
GLOVE ECLIPSE 8.0 STRL XLNG CF (GLOVE) ×2 IMPLANT
GOWN STRL REUS W/ TWL LRG LVL3 (GOWN DISPOSABLE) ×6 IMPLANT
GOWN STRL REUS W/TWL LRG LVL3 (GOWN DISPOSABLE) ×3
KIT BASIN OR (CUSTOM PROCEDURE TRAY) ×2 IMPLANT
KIT STR SPEAR 1.8 FBRTK DISP (KITS) IMPLANT
KIT TURNOVER KIT B (KITS) ×2 IMPLANT
MANIFOLD NEPTUNE II (INSTRUMENTS) ×2 IMPLANT
NDL HYPO 25X1 1.5 SAFETY (NEEDLE) ×2 IMPLANT
NDL SCORPION MULTI FIRE (NEEDLE) IMPLANT
NDL SPNL 18GX3.5 QUINCKE PK (NEEDLE) ×2 IMPLANT
NDL SUT 6 .5 CRC .975X.05 MAYO (NEEDLE) IMPLANT
NEEDLE HYPO 25X1 1.5 SAFETY (NEEDLE) ×1 IMPLANT
NEEDLE MAYO TAPER (NEEDLE) ×2
NEEDLE SCORPION MULTI FIRE (NEEDLE) ×1 IMPLANT
NEEDLE SPNL 18GX3.5 QUINCKE PK (NEEDLE) ×1 IMPLANT
NS IRRIG 1000ML POUR BTL (IV SOLUTION) ×2 IMPLANT
PACK SHOULDER (CUSTOM PROCEDURE TRAY) ×2 IMPLANT
PAD ARMBOARD 7.5X6 YLW CONV (MISCELLANEOUS) ×4 IMPLANT
PAD COLD SHLDR WRAP-ON (PAD) IMPLANT
RESTRAINT HEAD UNIVERSAL NS (MISCELLANEOUS) ×2 IMPLANT
SLING ARM IMMOBILIZER LRG (SOFTGOODS) IMPLANT
SLING ARM IMMOBILIZER MED (SOFTGOODS) IMPLANT
SPONGE T-LAP 18X18 ~~LOC~~+RFID (SPONGE) IMPLANT
SPONGE T-LAP 4X18 ~~LOC~~+RFID (SPONGE) ×4 IMPLANT
STRIP CLOSURE SKIN 1/2X4 (GAUZE/BANDAGES/DRESSINGS) ×2 IMPLANT
SUCTION FRAZIER HANDLE 10FR (MISCELLANEOUS) ×1
SUCTION TUBE FRAZIER 10FR DISP (MISCELLANEOUS) ×2 IMPLANT
SUT ETHILON 3 0 PS 1 (SUTURE) ×2 IMPLANT
SUT FIBERWIRE #2 38 T-5 BLUE (SUTURE)
SUT MNCRL AB 3-0 PS2 18 (SUTURE) ×2 IMPLANT
SUT VIC AB 0 CT1 27 (SUTURE) ×1
SUT VIC AB 0 CT1 27XBRD ANBCTR (SUTURE) ×2 IMPLANT
SUT VIC AB 1 CT1 27 (SUTURE) ×2
SUT VIC AB 1 CT1 27XBRD ANBCTR (SUTURE) ×2 IMPLANT
SUT VIC AB 2-0 CT1 27 (SUTURE) ×1
SUT VIC AB 2-0 CT1 TAPERPNT 27 (SUTURE) ×2 IMPLANT
SUT VICRYL 0 UR6 27IN ABS (SUTURE) IMPLANT
SUTURE FIBERWR #2 38 T-5 BLUE (SUTURE) IMPLANT
SUTURE TAPE 1.3 FIBERLOP 20 ST (SUTURE) IMPLANT
SUTURETAPE 1.3 FIBERLOOP 20 ST (SUTURE) ×1
SYR 20ML LL LF (SYRINGE) ×4 IMPLANT
SYR 3ML LL SCALE MARK (SYRINGE) ×2 IMPLANT
SYR TB 1ML LUER SLIP (SYRINGE) ×2 IMPLANT
TOWEL GREEN STERILE (TOWEL DISPOSABLE) ×2 IMPLANT
TOWEL GREEN STERILE FF (TOWEL DISPOSABLE) ×2 IMPLANT
TUBING ARTHROSCOPY IRRIG 16FT (MISCELLANEOUS) ×2 IMPLANT
WATER STERILE IRR 1000ML POUR (IV SOLUTION) ×2 IMPLANT
YANKAUER SUCT BULB TIP NO VENT (SUCTIONS) IMPLANT

## 2022-10-08 NOTE — Brief Op Note (Signed)
   10/08/2022  4:00 PM  PATIENT:  Laura [name]  56 y.o. female  PRE-OPERATIVE DIAGNOSIS:  RIGHT ROTATOR CUFF TEAR, BICEPS TENDINITIS, ACROMIOCLAVICULAR OSTEOARTHRITIS  POST-OPERATIVE DIAGNOSIS:  RIGHT ROTATOR CUFF TEAR, BICEPS TENDINITIS, ACROMIOCLAVICULAR OSTEOARTHRITIS  PROCEDURE:  Procedure(s): RIGHT SHOULDER ARTHROSCOPY, SUBACROMIAL DECOMPRESSION, MINI OPEN ROTATOR CUFF TEAR REPAIR, ARTHROSCOPIC DISTAL CLAVICLE EXCISION BICEPS TENODESIS,limited debridement  SURGEON:  Surgeon(s): Marlou Sa, Tonna Corner, MD  ASSISTANT: Magnant PA  ANESTHESIA:   General  EBL: 25 ml    Total I/O In: 1300 [I.V.:1000; IV Piggyback:300] Out: 25 [Blood:25]  BLOOD ADMINISTERED: none  DRAINS: None  LOCAL MEDICATIONS USED: Vanco powder  SPECIMEN:  No Specimen  COUNTS:  YES  TOURNIQUET:  * No tourniquets in log *  DICTATION: .Other Dictation: Dictation Number [id number]  PLAN OF CARE: Admit for overnight observation  PATIENT DISPOSITION:  PACU - hemodynamically stable

## 2022-10-08 NOTE — Transfer of Care (Signed)
Immediate Anesthesia Transfer of Care Note  Patient: Laura [name]  Procedure(s) Performed: RIGHT SHOULDER ARTHROSCOPY, SUBACROMIAL DECOMPRESSION, MINI OPEN ROTATOR CUFF TEAR REPAIR, ARTHROSCOPIC DISTAL CLAVICLE EXCISION (Right: Shoulder) BICEPS TENODESIS (Right: Shoulder)  Patient Location: PACU  Anesthesia Type:General  Level of Consciousness: drowsy and patient cooperative  Airway & Oxygen Therapy: Patient connected to face mask oxygen  Post-op Assessment: Report given to RN and Post -op Vital signs reviewed and stable  Post vital signs: Reviewed and stable  Last Vitals:  Vitals Value Taken Time  BP 98/58 10/08/22 1616  Temp 37.1 C 10/08/22 1615  Pulse 93 10/08/22 1617  Resp 17 10/08/22 1617  SpO2 95 % 10/08/22 1617  Vitals shown include unvalidated device data.  Last Pain:  Vitals:   10/08/22 1035  TempSrc: Oral  PainSc: 7       Patients Stated Pain Goal: 2 (72/82/06 0156)  Complications: No notable events documented.

## 2022-10-08 NOTE — Op Note (Unsigned)
Laura [name], Laura [name] MEDICAL RECORD NO: [id number] ACCOUNT NO: [id number] DATE OF BIRTH: 1966/09/20 FACILITY: [facility] LOCATION: [facility] PHYSICIAN: Yetta Barre. Marlou Sa, MD  Operative Report   DATE OF PROCEDURE: 10/08/2022  PREOPERATIVE DIAGNOSES:  Right shoulder degenerative SLAP tear, biceps tendinitis, AC joint arthritis and rotator cuff tear.  POSTOPERATIVE DIAGNOSES:  Right shoulder degenerative SLAP tear, biceps tendinitis, AC joint arthritis and rotator cuff tear.  PROCEDURE:  Right shoulder arthroscopy with limited debridement of the superior labrum with biceps tendon release, arthroscopic distal clavicle excision, mini open rotator cuff tear repair and biceps tenodesis.  SURGEON:  Yetta Barre. Marlou Sa, MD  ASSISTANT:  Annie Main.  INDICATIONS:  This is a 56 year old patient with rotator cuff pathology along with [facility] joint pathology and biceps tendon mediated pain, who presents for operative management after explanation of risks and benefits and failure of conservative management.  DESCRIPTION OF PROCEDURE:  The patient was brought to the operating room where general anesthetic was induced.  Preoperative antibiotics administered.  Timeout was called.  The patient was placed in the beach chair position with the head in neutral  position.  Right shoulder examined under anesthesia.  Found to have good range of motion including 70 degrees of external rotation 100 degrees of isolated glenohumeral abduction and 170 forward flexion with good shoulder stability anterior and posterior.   Right shoulder, arm and hand prescrubbed with alcohol and Betadine, allowed to air dry.  Prepped with DuraPrep solution and draped in sterile manner.  Ioban used to seal the operative field.  Posterior portal was created after calling timeout. Anterior  portal created under direct visualization in line with the distal end of the clavicle.  Diagnostic arthroscopy demonstrated rotator cuff tear of the supraspinatus.   Glenohumeral articular surfaces were intact. Degenerative SLAP tear and biceps  tendinitis was present.  Anterior inferior, posterior inferior, glenohumeral ligaments intact.  After creating the anterior portal, the biceps tendon was released and the superior labrum was debrided.  Instruments were removed, and then placed into the  subacromial space.  Lateral portal created under direct visualization.  Rotator cuff tear visualized.  Bursectomy performed.  The distal clavicle about 4-5 mm was then resected.  This gave very good decompression of what appeared to be osteolysis, which  had already resected about 5 mm of the normal depth, and length of the distal clavicle.  The posterior and superior ligaments were preserved.  Next, instruments were removed.  Portals were closed using 3-0 nylon.  Ioban used to cover the entire operative  field.  Incision made off the anterolateral margin of the acromion.  Deltoid split a measured distance of 4 cm from the anterolateral margin of the acromion marked with #1 Vicryl suture.  Bursectomy performed.  Rotator cuff tear identified.  Biceps  tendon was tenodesed into the bicipital groove with two Arthrex suture anchors in the proximal and distal aspect of the groove after placing a FiberLink suture into the biceps tendon.  About 3.5 cm of the tendon was removed.  The tendon was then  tenodesed using knotless suture anchors proximally and distally and then proximally within the biceps groove and this was oversewn using two 0 Vicryl sutures.  Secured repair under appropriate tension was achieved.  Attention then directed towards the  rotator cuff.  Acromioplasty performed.  Devitalized rotator cuff tear was debrided and this left a tear, which was a U-shaped tear measuring about 2.5 x 2.5 cm.  Five Vicryl grasping sutures were placed in Mason-Allen fashion in  the leading edge of the  tear.  The footprint was prepared to bleeding surface.  Two Arthrex suture tape anchors  were placed at the junction of the articular surface and the greater tuberosity.  The rotator cuff was then reduced to its footprint and the SutureTapes were passed  posterior to anterior x 8 using the Arthrex Scorpion suture passer.  These suture tapes were then tied and crossed.  The anterior suture tapes were matched with 3 of the Vicryl grasping sutures and the posterior suture tapes were matched with the other 2  Vicryl sutures.  These were then placed into a SwiveLock with the arm in abduction.  Watertight repair achieved.  Thorough irrigation was then performed.  Vancomycin powder placed.  Deltoid split closed using #1 Vicryl suture followed by interrupted  inverted 0 Vicryl suture, 2-0 Vicryl suture, and 3-0 Monocryl with Steri-Strips.  Impervious dressings placed along with the shoulder immobilizer.  Luke's assistance was required at all times for opening, closing, retraction, mobilization of tissue.  His  assistance was a medical necessity.     SUJ D: 10/08/2022 4:10:09 pm T: 10/08/2022 9:33:00 pm  JOB: [id number]/ [id number]

## 2022-10-08 NOTE — Anesthesia Procedure Notes (Signed)
Procedure Name: Intubation Date/Time: 10/08/2022 12:59 PM  Performed by: Ester Rink, CRNAPre-anesthesia Checklist: Patient identified, Emergency Drugs available, Suction available and Patient being monitored Patient Re-evaluated:Patient Re-evaluated prior to induction Oxygen Delivery Method: Circle system utilized Preoxygenation: Pre-oxygenation with 100% oxygen Induction Type: IV induction Ventilation: Mask ventilation without difficulty Laryngoscope Size: Mac and 4 Grade View: Grade I Tube type: Oral Tube size: 7.0 mm Number of attempts: 1 Airway Equipment and Method: Stylet and Oral airway Placement Confirmation: ETT inserted through vocal cords under direct vision, positive ETCO2 and breath sounds checked- equal and bilateral Secured at: 21 cm Tube secured with: Tape Dental Injury: Teeth and Oropharynx as per pre-operative assessment
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
## 2022-10-08 NOTE — Anesthesia Procedure Notes (Signed)
Anesthesia Regional Block: Interscalene brachial plexus block   Pre-Anesthetic Checklist: , timeout performed,  Correct Patient, Correct Site, Correct Laterality,  Correct Procedure, Correct Position, site marked,  Risks and benefits discussed,  Surgical consent,  Pre-op evaluation,  At surgeon's request and post-op pain management  Laterality: Right  Prep: chloraprep       Needles:  Injection technique: Single-shot  Needle Type: Echogenic Stimulator Needle     Needle Length: 5cm  Needle Gauge: 22     Additional Needles:   Procedures:,,,, ultrasound used (permanent image in chart),,    Narrative:  Start time: 10/08/2022 11:32 AM End time: 10/08/2022 11:39 AM Injection made incrementally with aspirations every 5 mL.  Performed by: Personally  Anesthesiologist: Santa Lighter, MD  Additional Notes: Functioning IV was confirmed and monitors were applied.  A 4m 22ga Arrow echogenic stimulator needle was used. Sterile prep and drape, hand hygiene, and sterile gloves were used.  Negative aspiration and negative test dose prior to incremental administration of local anesthetic. The patient tolerated the procedure well.  Ultrasound guidance: relevent anatomy identified, needle position confirmed, local anesthetic spread visualized around nerve(s), vascular puncture avoided.  Image printed for medical record.

## 2022-10-09 DIAGNOSIS — M75121 Complete rotator cuff tear or rupture of right shoulder, not specified as traumatic: Secondary | ICD-10-CM

## 2022-10-09 DIAGNOSIS — M19011 Primary osteoarthritis, right shoulder: Secondary | ICD-10-CM

## 2022-10-09 DIAGNOSIS — M7521 Bicipital tendinitis, right shoulder: Secondary | ICD-10-CM

## 2022-10-09 DIAGNOSIS — S43431A Superior glenoid labrum lesion of right shoulder, initial encounter: Secondary | ICD-10-CM

## 2022-10-09 MED ORDER — DOCUSATE SODIUM 100 MG PO CAPS: 100.0000 mg | ORAL_CAPSULE | Freq: Two times a day (BID) | ORAL | 0 refills | Status: DC

## 2022-10-09 MED ORDER — HYDROMORPHONE HCL 2 MG PO TABS: 1.0000 mg | ORAL_TABLET | ORAL | 0 refills | Status: DC | PRN

## 2022-10-09 MED ORDER — GUAIFENESIN-DM 100-10 MG/5ML PO SYRP
5.0000 mL | ORAL_SOLUTION | ORAL | Status: DC | PRN
  Administered 2022-10-09 – 2022-10-10 (×3): 5 mL via ORAL
  Filled 2022-10-09 (×3): qty 10

## 2022-10-09 MED ORDER — TIZANIDINE HCL 4 MG PO TABS
4.0000 mg | ORAL_TABLET | Freq: Three times a day (TID) | ORAL | Status: DC | PRN
  Administered 2022-10-09 – 2022-10-10 (×2): 4 mg via ORAL
  Filled 2022-10-09 (×2): qty 1

## 2022-10-09 MED ORDER — ACETAMINOPHEN 325 MG PO TABS: 325.0000 mg | ORAL_TABLET | Freq: Four times a day (QID) | ORAL | 0 refills | Status: DC | PRN

## 2022-10-09 NOTE — Anesthesia Postprocedure Evaluation (Signed)
Anesthesia Post Note  Patient: Laura [name]  Procedure(s) Performed: RIGHT SHOULDER ARTHROSCOPY, SUBACROMIAL DECOMPRESSION, MINI OPEN ROTATOR CUFF TEAR REPAIR, ARTHROSCOPIC DISTAL CLAVICLE EXCISION (Right: Shoulder) BICEPS TENODESIS (Right: Shoulder)     Patient location during evaluation: PACU Anesthesia Type: General Level of consciousness: sedated Pain management: pain level controlled Vital Signs Assessment: post-procedure vital signs reviewed and stable Respiratory status: spontaneous breathing and respiratory function stable Cardiovascular status: stable Postop Assessment: no apparent nausea or vomiting Anesthetic complications: no   No notable events documented.                Janalyn Higby DANIEL

## 2022-10-09 NOTE — Progress Notes (Signed)
  Subjective: Patient stable.  Did not sleep much last night.  Having some spasm in the shoulder.   Objective: Vital signs in last 24 hours: Temp:  [98.3 F (36.8 C)-98.8 F (37.1 C)] 98.3 F (36.8 C) (12/09 0500) Pulse Rate:  [82-104] 87 (12/09 0500) Resp:  [14-19] 18 (12/09 0500) BP: (114-168)/(67-110) 114/74 (12/09 0500) SpO2:  [91 %-96 %] 94 % (12/09 0500)  Intake/Output from previous day: 12/08 0701 - 12/09 0700 In: 2323 [P.O.:240; I.V.:1683; IV Piggyback:400] Out: 325 [Urine:300; Blood:25] Intake/Output this shift: No intake/output data recorded.  Exam:  No cellulitis present Compartment soft  Labs: No results for input(s): "HGB" in the last 72 hours. No results for input(s): "WBC", "RBC", "HCT", "PLT" in the last 72 hours. No results for input(s): "NA", "K", "CL", "CO2", "BUN", "CREATININE", "GLUCOSE", "CALCIUM" in the last 72 hours. No results for input(s): "LABPT", "INR" in the last 72 hours.  Assessment/Plan: Plan at this time is mobilization with both physical therapy today and Occupational Therapy.  Plan to keep her 1 more night and then discharge to home tomorrow.  Patient has little family or social support in [place] at her home.   Laura [name] 10/09/2022, 7:31 AM

## 2022-10-09 NOTE — Plan of Care (Signed)

## 2022-10-10 DIAGNOSIS — Z8616 Personal history of COVID-19: Secondary | ICD-10-CM | POA: Diagnosis not present

## 2022-10-10 DIAGNOSIS — R7303 Prediabetes: Secondary | ICD-10-CM | POA: Diagnosis present

## 2022-10-10 DIAGNOSIS — G4730 Sleep apnea, unspecified: Secondary | ICD-10-CM | POA: Diagnosis present

## 2022-10-10 DIAGNOSIS — F419 Anxiety disorder, unspecified: Secondary | ICD-10-CM | POA: Diagnosis present

## 2022-10-10 DIAGNOSIS — Z87891 Personal history of nicotine dependence: Secondary | ICD-10-CM | POA: Diagnosis not present

## 2022-10-10 DIAGNOSIS — Z79899 Other long term (current) drug therapy: Secondary | ICD-10-CM | POA: Diagnosis not present

## 2022-10-10 DIAGNOSIS — Z833 Family history of diabetes mellitus: Secondary | ICD-10-CM | POA: Diagnosis not present

## 2022-10-10 DIAGNOSIS — M75121 Complete rotator cuff tear or rupture of right shoulder, not specified as traumatic: Secondary | ICD-10-CM | POA: Diagnosis present

## 2022-10-10 DIAGNOSIS — M797 Fibromyalgia: Secondary | ICD-10-CM | POA: Diagnosis present

## 2022-10-10 DIAGNOSIS — Z803 Family history of malignant neoplasm of breast: Secondary | ICD-10-CM | POA: Diagnosis not present

## 2022-10-10 DIAGNOSIS — X58XXXA Exposure to other specified factors, initial encounter: Secondary | ICD-10-CM | POA: Diagnosis present

## 2022-10-10 DIAGNOSIS — Z87442 Personal history of urinary calculi: Secondary | ICD-10-CM | POA: Diagnosis not present

## 2022-10-10 DIAGNOSIS — I10 Essential (primary) hypertension: Secondary | ICD-10-CM | POA: Diagnosis present

## 2022-10-10 DIAGNOSIS — Z885 Allergy status to narcotic agent status: Secondary | ICD-10-CM | POA: Diagnosis not present

## 2022-10-10 DIAGNOSIS — Z823 Family history of stroke: Secondary | ICD-10-CM | POA: Diagnosis not present

## 2022-10-10 DIAGNOSIS — M7521 Bicipital tendinitis, right shoulder: Secondary | ICD-10-CM | POA: Diagnosis present

## 2022-10-10 DIAGNOSIS — E7800 Pure hypercholesterolemia, unspecified: Secondary | ICD-10-CM | POA: Diagnosis present

## 2022-10-10 DIAGNOSIS — M19011 Primary osteoarthritis, right shoulder: Secondary | ICD-10-CM | POA: Diagnosis present

## 2022-10-10 DIAGNOSIS — J45909 Unspecified asthma, uncomplicated: Secondary | ICD-10-CM | POA: Diagnosis present

## 2022-10-10 DIAGNOSIS — S43431A Superior glenoid labrum lesion of right shoulder, initial encounter: Secondary | ICD-10-CM | POA: Diagnosis present

## 2022-10-10 DIAGNOSIS — Z79890 Hormone replacement therapy (postmenopausal): Secondary | ICD-10-CM | POA: Diagnosis not present

## 2022-10-10 DIAGNOSIS — Z888 Allergy status to other drugs, medicaments and biological substances status: Secondary | ICD-10-CM | POA: Diagnosis not present

## 2022-10-10 DIAGNOSIS — Z800 Family history of malignant neoplasm of digestive organs: Secondary | ICD-10-CM | POA: Diagnosis not present

## 2022-10-10 DIAGNOSIS — Z91013 Allergy to seafood: Secondary | ICD-10-CM | POA: Diagnosis not present

## 2022-10-10 DIAGNOSIS — Z90711 Acquired absence of uterus with remaining cervical stump: Secondary | ICD-10-CM | POA: Diagnosis not present

## 2022-10-10 LAB — BASIC METABOLIC PANEL
Anion gap: 13 (ref 5–15)
BUN: 13 mg/dL (ref 6–20)
CO2: 32 mmol/L (ref 22–32)
Calcium: 9.1 mg/dL (ref 8.9–10.3)
Chloride: 92 mmol/L — ABNORMAL LOW (ref 98–111)
Creatinine, Ser: 0.71 mg/dL (ref 0.44–1.00)
GFR, Estimated: >60 mL/min (ref >60)
Glucose, Bld: 111 mg/dL — ABNORMAL HIGH (ref 70–99)
Potassium: 2.7 mmol/L — CL (ref 3.5–5.1)
Sodium: 137 mmol/L (ref 135–145)

## 2022-10-10 MED ORDER — POTASSIUM CHLORIDE CRYS ER 20 MEQ PO TBCR
40.0000 meq | EXTENDED_RELEASE_TABLET | Freq: Three times a day (TID) | ORAL | Status: DC
  Administered 2022-10-10 – 2022-10-11 (×4): 40 meq via ORAL
  Filled 2022-10-10 (×4): qty 2

## 2022-10-10 MED ORDER — POTASSIUM CHLORIDE CRYS ER 20 MEQ PO TBCR: 40.0000 meq | EXTENDED_RELEASE_TABLET | Freq: Three times a day (TID) | ORAL | Status: DC

## 2022-10-10 NOTE — Progress Notes (Signed)
Patient's potassium is 2.7 this am. The on call was made aware and orders have been made.

## 2022-10-10 NOTE — Evaluation (Signed)
Physical Therapy Evaluation & Discharge Patient Details Name: Laura [name] MRN: [id number] DOB: 1965-12-29 Today's Date: 10/10/2022  History of Present Illness  Pt is a 56 y/o female admitted 10/08/22 wtih R shoulder pain; s/p same day R shoulder arthroscopy, subacromial decompression, mini open rotator cuff tear repair, and biceps tenodesis. PMH includes anxiety, depression, asthma, fibromyalgia, high cholesterol, HTN, kidney stones, and L torn rotator cuff.   Clinical Impression  Patient evaluated by Physical Therapy with no further acute PT needs identified. PTA, pt independent, works, active, lives alone with family far away. Today, pt moving well without DME, tolerated gait and stair training despite c/o significant R shoulder pain. All education has been completed and the patient has no further questions. 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 Acute PT is signing off. Thank you for this referral.     Recommendations for follow up therapy are one component of a multi-disciplinary discharge planning process, led by the attending physician.  Recommendations may be updated based on patient status, additional functional criteria and insurance authorization.  Follow Up Recommendations Follow physician's recommendations for discharge plan and follow up therapies        Assistance Recommended at Discharge Intermittent Supervision/Assistance  Patient can return home with the following  Assistance with cooking/housework;A little help with bathing/dressing/bathroom;Assist for transportation    Equipment Recommendations None recommended by PT  Recommendations for Other Services   Mobility Specialist   Functional Status Assessment       Precautions / Restrictions Precautions Precautions: Fall;Shoulder Type of Shoulder Precautions: no AROM/PROM of shoulder, can perform elbow/wrist/hand ROM Shoulder Interventions: Shoulder sling/immobilizer Required Braces or Orthoses: Other  Brace (sling RUE) Restrictions Weight Bearing Restrictions: Yes RUE Weight Bearing: Non weight bearing      Mobility  Bed Mobility Overal bed mobility: Modified Independent Bed Mobility: Supine to Sit, Sit to Supine           General bed mobility comments: HOB elevated    Transfers Overall transfer level: Independent Equipment used: None Transfers: Sit to/from Stand                  Ambulation/Gait Ambulation/Gait assistance: Supervision Gait Distance (Feet): 80 Feet Assistive device: None Gait Pattern/deviations: Step-through pattern, Decreased stride length       General Gait Details: slow, guarded gait without DME; supervision for safety, pt reports feeling "off" with medications. deferred further distance secondary to pain  Stairs Stairs: Yes Stairs assistance: Supervision Stair Management: One rail Left, Alternating pattern Number of Stairs: 4 General stair comments: ascend/descend 4 steps with LUE rail support, supervision for safety  Wheelchair Mobility    Modified Rankin (Stroke Patients Only)       Balance Overall balance assessment: Needs assistance Sitting-balance support: Feet supported Sitting balance-Leahy Scale: Good     Standing balance support: No upper extremity supported, During functional activity Standing balance-Leahy Scale: Good               High level balance activites: Direction changes, Turns, Sudden stops, Head turns High Level Balance Comments: no overt instability or LOB observed with higher level balance tasks             Pertinent Vitals/Pain Pain Assessment Pain Assessment: Faces Faces Pain Scale: Hurts whole lot Pain Location: RUE Pain Descriptors / Indicators: Discomfort, Constant Pain Intervention(s): Limited activity within patient's tolerance, Monitored during session, Repositioned, RN gave pain meds during session, Ice applied    Home Living Family/patient expects to be discharged to::  Private residence Living Arrangements: Alone Available Help at Discharge: Friend(s);Family;Available PRN/intermittently Type of Home: Mobile home Home Access: Stairs to enter Entrance Stairs-Rails: Left Entrance Stairs-Number of Steps: 4   Home Layout: One level Home Equipment: Other (comment) (bionic swing (from this hospital admission)) Additional Comments: reports "I'll be put in the bionic sling tomorrow"    Prior Function Prior Level of Function : Independent/Modified Independent;Working/employed;Driving             Mobility Comments: independent without DME. enjoys hiking, but not able to as much with knee pain. works physically demanding job at [employer] (2x/wk) ADLs Comments: independent     Hand Dominance   Dominant Hand: Right    Extremity/Trunk Assessment   Upper Extremity Assessment Upper Extremity Assessment: Defer to OT evaluation RUE Deficits / Details: elbow/wrist/hand ROM WFL, limited due to pain RUE: Unable to fully assess due to pain;Unable to fully assess due to immobilization RUE Coordination: decreased fine motor;decreased gross motor    Lower Extremity Assessment Lower Extremity Assessment: Overall WFL for tasks assessed    Cervical / Trunk Assessment Cervical / Trunk Assessment: Normal  Communication   Communication: No difficulties  Cognition Arousal/Alertness: Awake/alert Behavior During Therapy: WFL for tasks assessed/performed Overall Cognitive Status: Within Functional Limits for tasks assessed                                          General Comments General comments (skin integrity, edema, etc.): VSS on RA    Exercises     Assessment/Plan    PT Assessment Patient does not need any further PT services  PT Problem List         PT Treatment Interventions      PT Goals (Current goals can be found in the Care Plan section)  Acute Rehab PT Goals PT Goal Formulation: All assessment and education  complete, DC therapy    Frequency       Co-evaluation               AM-PAC PT "6 Clicks" Mobility  Outcome Measure Help needed turning from your back to your side while in a flat bed without using bedrails?: None Help needed moving from lying on your back to sitting on the side of a flat bed without using bedrails?: None Help needed moving to and from a bed to a chair (including a wheelchair)?: A Little Help needed standing up from a chair using your arms (e.g., wheelchair or bedside chair)?: A Little Help needed to walk in hospital room?: A Little Help needed climbing 3-5 steps with a railing? : A Little 6 Click Score: 20    End of Session Equipment Utilized During Treatment:  (RUE sling) Activity Tolerance: Patient tolerated treatment well;Patient limited by pain Patient left: in bed;with call bell/phone within reach Nurse Communication: Mobility status PT Visit Diagnosis: Other abnormalities of gait and mobility (R26.89);Pain Pain - Right/Left: Right Pain - part of body: Shoulder    Time: 3762-8315 PT Time Calculation (min) (ACUTE ONLY): 20 min   Charges:   PT Evaluation $PT Eval Low Complexity: 1 Low        Mabeline Caras, PT, DPT Acute Rehabilitation Services  Personal: [facility] Office: [place] 10/10/2022, 5:46 PM

## 2022-10-10 NOTE — Evaluation (Signed)
Occupational Therapy Evaluation Patient Details Name: Laura [name] MRN: [id number] DOB: 1966/08/19 Today's Date: 10/10/2022   History of Present Illness Pt is a 56 y/o F presenting wtih R shoulder pain, s/p R shoulder arthroscopy, subacromial decompression, mini open rotator cuff tear repair, and biceps tenodesis. PMH includes anxiety, depression, asthma, fibromyalgia, high cholesterol, HTN, kidney stones, and L torn rotator cuff.   Clinical Impression   Pt reports independence at baseline with ADLs and functional mobility, lives alone and has family/friends available for PRN assist if needed. Pt currently needing min-mod A for ADLs, min guard for bed mobility,  and min guard for transfers without AD. Began education (handouts provided) on HEP, precautions, sling wear, and compensatory strategies for ADLs. Pt verbalized and is able to demo understanding, however reports feeling "foggy" since receiving medication, will need reinforcement. Pt presenting with impairments listed below, will follow acutely. Recommend HHOT at d/c.      Recommendations for follow up therapy are one component of a multi-disciplinary discharge planning process, led by the attending physician.  Recommendations may be updated based on patient status, additional functional criteria and insurance authorization.   Follow Up Recommendations  Home health OT     Assistance Recommended at Discharge Set up Supervision/Assistance  Patient can return home with the following A little help with walking and/or transfers;A lot of help with bathing/dressing/bathroom;Assistance with cooking/housework;Assist for transportation;Help with stairs or ramp for entrance    Functional Status Assessment  Patient has had a recent decline in their functional status and demonstrates the ability to make significant improvements in function in a reasonable and predictable amount of time.  Equipment Recommendations  BSC/3in1    Recommendations  for Other Services PT consult     Precautions / Restrictions Precautions Precautions: Fall;Shoulder Type of Shoulder Precautions: no AROM/PROM of shoulder, can perform elbow/wrist/hand ROM Shoulder Interventions: Shoulder sling/immobilizer Required Braces or Orthoses: Other Brace (sling RUE) Restrictions Weight Bearing Restrictions: Yes RUE Weight Bearing: Non weight bearing      Mobility Bed Mobility Overal bed mobility: Needs Assistance Bed Mobility: Sidelying to Sit, Sit to Supine   Sidelying to sit: Min guard   Sit to supine: Min guard   General bed mobility comments: Rolling to L side    Transfers Overall transfer level: Needs assistance Equipment used: None Transfers: Sit to/from Stand Sit to Stand: Min guard           General transfer comment: pt reaching for doors/countertop support with mobility to bathroom;      Balance Overall balance assessment: Needs assistance Sitting-balance support: Feet supported Sitting balance-Leahy Scale: Good Sitting balance - Comments: sits EOB without LOB   Standing balance support: During functional activity Standing balance-Leahy Scale: Fair Standing balance comment: reaching for external support                           ADL either performed or assessed with clinical judgement   ADL Overall ADL's : Needs assistance/impaired Eating/Feeding: Set up;Sitting   Grooming: Minimal assistance;Sitting   Upper Body Bathing: Moderate assistance;Sitting   Lower Body Bathing: Moderate assistance;Sitting/lateral leans   Upper Body Dressing : Moderate assistance;Sitting   Lower Body Dressing: Moderate assistance;Sitting/lateral leans   Toilet Transfer: Minimal assistance;Ambulation;Regular Toilet   Toileting- Clothing Manipulation and Hygiene: Minimal assistance       Functional mobility during ADLs: Min guard       Vision   Vision Assessment?: No apparent visual deficits  Perception  Perception Perception Tested?: No   Praxis Praxis Praxis tested?: Not tested    Pertinent Vitals/Pain Pain Assessment Pain Assessment: Faces Pain Score: 8  Faces Pain Scale: Hurts whole lot Pain Location: RUE Pain Descriptors / Indicators: Discomfort, Constant Pain Intervention(s): Limited activity within patient's tolerance, Monitored during session, Patient requesting pain meds-RN notified, Repositioned     Hand Dominance Right   Extremity/Trunk Assessment Upper Extremity Assessment Upper Extremity Assessment: RUE deficits/detail RUE Deficits / Details: elbow/wrist/hand ROM WFL, limited due to pain RUE: Unable to fully assess due to immobilization RUE Coordination: decreased fine motor;decreased gross motor   Lower Extremity Assessment Lower Extremity Assessment: Overall WFL for tasks assessed (reports history of knee instability)   Cervical / Trunk Assessment Cervical / Trunk Assessment: Normal   Communication Communication Communication: No difficulties   Cognition Arousal/Alertness: Awake/alert Behavior During Therapy: WFL for tasks assessed/performed Overall Cognitive Status: Within Functional Limits for tasks assessed                                       General Comments  VSS on RA    Exercises     Shoulder Instructions      Home Living Family/patient expects to be discharged to:: Private residence Living Arrangements: Alone Available Help at Discharge: Friend(s);Family;Available PRN/intermittently Type of Home: Mobile home Home Access: Stairs to enter Entrance Stairs-Number of Steps: 4   Home Layout: One level     Bathroom Shower/Tub: Teacher, early years/pre: Standard     Home Equipment: Other (comment) (bionic swing (from this hospital admission))          Prior Functioning/Environment Prior Level of Function : Independent/Modified Independent;Working/employed;Driving             Mobility Comments: no AD  use ADLs Comments: works 2 days/week;  physically demanding job at [employer] Problem List: Decreased strength;Decreased range of motion;Decreased activity tolerance;Impaired UE functional use;Decreased knowledge of precautions;Impaired balance (sitting and/or standing);Decreased coordination      OT Treatment/Interventions: Self-care/ADL training;Therapeutic exercise;Therapeutic activities;Patient/family education;Balance training;DME and/or AE instruction    OT Goals(Current goals can be found in the care plan section) Acute Rehab OT Goals Patient Stated Goal: none stated OT Goal Formulation: With patient Time For Goal Achievement: 10/24/22 Potential to Achieve Goals: Good ADL Goals Pt Will Perform Upper Body Dressing: with supervision;sitting Pt Will Perform Lower Body Dressing: with supervision;sitting/lateral leans;sit to/from stand Pt Will Transfer to Toilet: with supervision;ambulating;regular height toilet Pt Will Perform Tub/Shower Transfer: Tub transfer;Shower transfer;with supervision;ambulating;3 in 1 Pt/caregiver will Perform Home Exercise Program: Right Upper extremity;With written HEP provided;With Supervision  OT Frequency: Min 5X/week    Co-evaluation              AM-PAC OT "6 Clicks" Daily Activity     Outcome Measure Help from another person eating meals?: None Help from another person taking care of personal grooming?: None Help from another person toileting, which includes using toliet, bedpan, or urinal?: A Little Help from another person bathing (including washing, rinsing, drying)?: A Lot Help from another person to put on and taking off regular upper body clothing?: A Lot Help from another person to put on and taking off regular lower body clothing?: A Lot 6 Click Score: 17   End of Session Equipment Utilized During Treatment: Other (comment) (sling) Nurse Communication: Mobility status  Activity Tolerance: Patient tolerated  treatment well Patient left: in bed;with call bell/phone within reach;with bed alarm set  OT Visit Diagnosis: Unsteadiness on feet (R26.81);Other abnormalities of gait and mobility (R26.89);Muscle weakness (generalized) (M62.81)                Time: 2244-9753 OT Time Calculation (min): 35 min Charges:  OT General Charges $OT Visit: 1 Visit OT Evaluation $OT Eval Moderate Complexity: 1 Mod OT Treatments $Self Care/Home Management : 8-22 mins  Renaye Rakers, OTD, OTR/L [facility] Acute Rehab [phone]  Renaye Rakers [place] 10/10/2022, 4:21 PM

## 2022-10-10 NOTE — Progress Notes (Signed)
Subjective: 2 Days Post-Op Procedure(s) (LRB): RIGHT SHOULDER ARTHROSCOPY, SUBACROMIAL DECOMPRESSION, MINI OPEN ROTATOR CUFF TEAR REPAIR, ARTHROSCOPIC DISTAL CLAVICLE EXCISION (Right) BICEPS TENODESIS (Right) Patient reports pain as moderate.    Objective: Vital signs in last 24 hours: Temp:  [98.1 F (36.7 C)] 98.1 F (36.7 C) (12/10 0443) Pulse Rate:  [71-82] 81 (12/10 0443) Resp:  [16-18] 16 (12/10 0443) BP: (90-146)/(63-82) 113/81 (12/10 0443) SpO2:  [94 %-96 %] 96 % (12/10 0443)  Intake/Output from previous day: 12/09 0701 - 12/10 0700 In: 730 [P.O.:730] Out: -  Intake/Output this shift: No intake/output data recorded.  No results for input(s): "HGB" in the last 72 hours. No results for input(s): "WBC", "RBC", "HCT", "PLT" in the last 72 hours. Recent Labs    10/10/22 0346  NA 137  K 2.7*  CL 92*  CO2 32  BUN 13  CREATININE 0.71  GLUCOSE 111*  CALCIUM 9.1   No results for input(s): "LABPT", "INR" in the last 72 hours.  Neurologically intact Neurovascular intact Sensation intact distally Intact pulses distally Dorsiflexion/Plantar flexion intact Incision: dressing C/D/I No cellulitis present Compartment soft   Assessment/Plan: 2 Days Post-Op Procedure(s) (LRB): RIGHT SHOULDER ARTHROSCOPY, SUBACROMIAL DECOMPRESSION, MINI OPEN ROTATOR CUFF TEAR REPAIR, ARTHROSCOPIC DISTAL CLAVICLE EXCISION (Right) BICEPS TENODESIS (Right) Advance diet Up with therapy D/C IV fluids Potassium at 2.7 this am.  Vitals stable and patient asymptomtic- klor-con restarted today.  Will recheck bmp tomorrow am Will plan to keep overnight for pain control in addition to hypokalemia.  Klor-con restarted to day and will obtain new bmp in the am       Aundra Dubin 10/10/2022, 9:52 AM

## 2022-10-11 ENCOUNTER — Other Ambulatory Visit: Payer: Self-pay

## 2022-10-11 LAB — BASIC METABOLIC PANEL
Anion gap: 14 (ref 5–15)
BUN: 11 mg/dL (ref 6–20)
CO2: 29 mmol/L (ref 22–32)
Calcium: 9.3 mg/dL (ref 8.9–10.3)
Chloride: 92 mmol/L — ABNORMAL LOW (ref 98–111)
Creatinine, Ser: 0.63 mg/dL (ref 0.44–1.00)
GFR, Estimated: >60 mL/min (ref >60)
Glucose, Bld: 122 mg/dL — ABNORMAL HIGH (ref 70–99)
Potassium: 3.2 mmol/L — ABNORMAL LOW (ref 3.5–5.1)
Sodium: 135 mmol/L (ref 135–145)

## 2022-10-11 NOTE — Progress Notes (Signed)
Jaanai A Rundquist to be D/C'd Home per MD order.  Discussed with the patient and all questions fully answered.  VSS, Skin clean, dry and intact without evidence of skin break down, no evidence of skin tears noted. IV catheter discontinued intact. Site without signs and symptoms of complications. Dressing and pressure applied.  An After Visit Summary was printed and given to the patient. Patient prescriptions sent to patient pharmacy.  D/c education completed with patient/family including follow up instructions, medication list, d/c activities limitations if indicated, with other d/c instructions as indicated by MD - patient able to verbalize understanding, all questions fully answered.   Patient instructed to return to ED, call 911, or call MD for any changes in condition.   Patient escorted via [place], and D/C home via private auto.  Manuella Ghazi 10/11/2022 1:42 PM

## 2022-10-11 NOTE — Discharge Instructions (Signed)
Use the CPM machine for one hour tonight.  Starting tomorrow, use the CPM machine 3 times per day for at least one hour each time; increase the degrees of range of motion with each session as you can tolerate.  No lifting with the operative arm or lifting the arm under its own power.  You may shower, dressings are waterproof.  Use the sling at all times, you may take it off for showering and to use the CPM machine.  You may come out of the sling 1-2 times per day to work on straightening your elbow, to avoid elbow stiffness.  Follow-up with Dr. Marlou Sa in the clinic at your given appointment date in ~1 week.  Call the office at [phone] with any questions/concerns or if you do not have a CPM machine.

## 2022-10-11 NOTE — Progress Notes (Signed)
Occupational Therapy Treatment Patient Details Name: Laura [name] MRN: [id number] DOB: 07-Nov-1965 Today's Date: 10/11/2022   History of present illness Pt is a 56 y/o female admitted 10/08/22 wtih R shoulder pain; s/p same day R shoulder arthroscopy, subacromial decompression, mini open rotator cuff tear repair, and biceps tenodesis. PMH includes anxiety, depression, asthma, fibromyalgia, high cholesterol, HTN, kidney stones, and L torn rotator cuff.   OT comments  Focus of session on educating pt in compensatory strategies for bathing and dressing. Pt requiring set up to min assist. Instructed in donning and doffing sling and adjusted straps. Reminded pt of NWB on R UE. Per PA, pt is only allowed AROM elbow to hand and PROM with device for shoulder. Pt reports she has been educated in its use.    Recommendations for follow up therapy are one component of a multi-disciplinary discharge planning process, led by the attending physician.  Recommendations may be updated based on patient status, additional functional criteria and insurance authorization.    Follow Up Recommendations  Follow physician's recommendations for discharge plan and follow up therapies     Assistance Recommended at Discharge PRN  Patient can return home with the following  A little help with bathing/dressing/bathroom;Assistance with cooking/housework;Assist for transportation;Help with stairs or ramp for entrance   Equipment Recommendations  None recommended by OT    Recommendations for Other Services      Precautions / Restrictions Precautions Precautions: Shoulder Type of Shoulder Precautions: no AROM/PROM of shoulder, can perform elbow/wrist/hand ROM Shoulder Interventions: Shoulder sling/immobilizer Required Braces or Orthoses: Sling Restrictions Weight Bearing Restrictions: Yes RUE Weight Bearing: Non weight bearing       Mobility Bed Mobility Overal bed mobility: Modified Independent                   Transfers Overall transfer level: Independent Equipment used: None                     Balance                                           ADL either performed or assessed with clinical judgement   ADL Overall ADL's : Needs assistance/impaired     Grooming: Set up;Sitting;Oral care;Wash/dry face   Upper Body Bathing: Minimal assistance;Sitting   Lower Body Bathing: Set up;Sit to/from stand   Upper Body Dressing : Minimal assistance;Sitting   Lower Body Dressing: Set up;Sitting/lateral leans   Toilet Transfer: Independent           Functional mobility during ADLs: Independent General ADL Comments: educated pt in compensatory strategies in ADLs and donning and doffing sling    Extremity/Trunk Assessment              Vision       Perception     Praxis      Cognition Arousal/Alertness: Awake/alert Behavior During Therapy: Flat affect Overall Cognitive Status: Within Functional Limits for tasks assessed                                          Exercises      Shoulder Instructions       General Comments      Pertinent Vitals/ Pain       Pain Assessment  Pain Assessment: 0-10 Pain Score: 8  Faces Pain Scale: Hurts whole lot Pain Location: RUE Pain Descriptors / Indicators: Discomfort, Constant Pain Intervention(s): Repositioned, Monitored during session, Patient requesting pain meds-RN notified  Home Living                                          Prior Functioning/Environment              Frequency  Min 3X/week        Progress Toward Goals  OT Goals(current goals can now be found in the care plan section)  Progress towards OT goals: Progressing toward goals  Acute Rehab OT Goals OT Goal Formulation: With patient Time For Goal Achievement: 10/24/22 Potential to Achieve Goals: Good  Plan      Co-evaluation                 AM-PAC OT "6 Clicks"  Daily Activity     Outcome Measure   Help from another person eating meals?: None Help from another person taking care of personal grooming?: None Help from another person toileting, which includes using toliet, bedpan, or urinal?: None Help from another person bathing (including washing, rinsing, drying)?: A Little Help from another person to put on and taking off regular upper body clothing?: A Little Help from another person to put on and taking off regular lower body clothing?: None 6 Click Score: 22    End of Session    OT Visit Diagnosis: Pain   Activity Tolerance Patient tolerated treatment well   Patient Left in chair;with call bell/phone within reach;with nursing/sitter in room   Nurse Communication          Time: 0093-8182 OT Time Calculation (min): 32 min  Charges: OT General Charges $OT Visit: 1 Visit OT Treatments $Self Care/Home Management : 23-37 mins  Cleta Alberts, OTR/L Acute Rehabilitation Services Office: [phone]   [name] 10/11/2022, 1:11 PM

## 2022-10-12 ENCOUNTER — Encounter: Payer: Self-pay | Admitting: *Deleted

## 2022-10-12 ENCOUNTER — Telehealth: Payer: Self-pay | Admitting: *Deleted

## 2022-10-12 DIAGNOSIS — G8929 Other chronic pain: Secondary | ICD-10-CM

## 2022-10-12 NOTE — Patient Outreach (Addendum)
Care Coordination [facility] Note Transition Care Management Follow-up Telephone Call Date of discharge and from where: Monday, 10/11/22 ; (R) shoulder arthroplasty; rotator cuff repair How have you been since you were released from the hospital? "It's been rough today; it's pretty painful like they told me it would be and the pain medicine is making me feel nauseated; but I am plugging through.  I am staying with a friend right now and my daughter will be taking me to the follow up appointment on Friday.  I am planning to ask the doctor about whether he wants PT coming to me or if I should go as an outpatient, because I can't drive for awhile after this surgery; I have all my medicine and am taking it all like they told me to-- I am using a pill box and it is working out okay.  Thanks for putting the resource person in touch with me, if I have to go to outpatient PT, I'll need to be able to use [facility] or something like that until I am released to resume driving after this surgery.... as far as I know the incision looks okay, I am leaving the dressing over it until I go to see the surgeon on Friday" Any questions or concerns? Yes- would like resources for transportation in light of post-surgical driving restrictions to attend outpatient PT; would also like resources for food acquisition in light of current economy/ grocery prices  Items Reviewed: Did the pt receive and understand the discharge instructions provided? Yes  Medications obtained and verified? Yes - patient declines full medication review; states she is lying down and not near medication- confirmed she obtained and is taking all post-op medications as prescribed Other? No  Any new allergies since your discharge? No  Dietary orders reviewed? Yes Do you have support at home? Yes  reports staying with friends temporarily post- recent surgery; reports she is independent in self care, requires some assistance periodically post- recent  surgery  Home Care and Equipment/Supplies: Were home health services ordered? no If so, what is the name of the agency? N/A  Has the agency set up a time to come to the patient's home? not applicable Were any new equipment or medical supplies ordered?  No What is the name of the medical supply agency? N/A Were you able to get the supplies/equipment? not applicable Do you have any questions related to the use of the equipment or supplies? No N/A  Functional Questionnaire: (I = Independent and D = Dependent) ADLs: I  equires some assistance periodically post- recent surgery  Bathing/Dressing- I  equires some assistance periodically post- recent surgery  Meal Prep- I  equires some assistance periodically post- recent surgery  Eating- I  Maintaining continence- I  Transferring/Ambulation- I  Managing Meds- I  Follow up appointments reviewed:  PCP Hospital f/u appt confirmed? No  Scheduled to see - on - @ [facility] f/u appt confirmed? Yes  Scheduled to see orthopedic surgical provider on Friday 10/15/22 @ 1:45 pm Are transportation arrangements needed? No  If their condition worsens, is the pt aware to call PCP or go to the Emergency Dept.? Yes Was the patient provided with contact information for the PCP's office or ED? No- patient declines; reports has contact information for all care providers Was to pt encouraged to call back with questions or concerns? Yes  SDOH assessments and interventions completed:   Yes SDOH Interventions Today    Flowsheet Row Most Recent Value  SDOH Interventions   Food Insecurity Interventions Intervention Not Indicated  [community resource care guide referral placed for provision of food resources- denies true food insecurity]  Transportation Interventions Intervention Not Indicated  [normally drives self,  daughter providing transportation after recent surgery,  community resource care guide referral placed for provision of  transportation resources-- she will need transportation to and from OP PT after recent shoulder surgery]      Care Coordination Interventions:  Referred for Care Coordination Services:  [place] referral placed today Provided education and advice around use of post-op pain medication, post-op nausea in setting of opioid use; signs/ symptoms post-op infection along with corresponding action plan    Encounter Outcome:  Pt. Visit Completed    Oneta Rack, RN, BSN, CCRN Alumnus RN CM Care Coordination/ Transition of [place] [facility] [phone]: direct office

## 2022-10-13 ENCOUNTER — Telehealth: Payer: Self-pay | Admitting: *Deleted

## 2022-10-13 ENCOUNTER — Encounter: Payer: Self-pay | Admitting: Orthopedic Surgery

## 2022-10-13 NOTE — Telephone Encounter (Signed)
   Telephone encounter was:  Successful.  10/13/2022 Name: Laura [name] MRN: [id number] DOB: April 26, 1966  [name] is a 56 y.o. year old female who is a primary care patient of Cox, Kirsten, MD . The community resource team was consulted for assistance with Transportation Needs  and [place] Patient provided [employer] information and also Moms meals through [employer] [facility] and a referral through [place] [facility] transportation and food banks in the area  Care guide performed the following interventions: Patient provided with information about care guide support team and interviewed to confirm resource needs.  Follow Up Plan:  No further follow up planned at this time. The patient has been provided with needed resources.  [place] [phone] [address] , [place] [address] Email : [name]

## 2022-10-14 ENCOUNTER — Telehealth: Payer: Self-pay | Admitting: *Deleted

## 2022-10-15 ENCOUNTER — Telehealth: Payer: Self-pay | Admitting: Orthopedic Surgery

## 2022-10-15 ENCOUNTER — Encounter: Payer: Self-pay | Admitting: Surgical

## 2022-10-15 ENCOUNTER — Ambulatory Visit (INDEPENDENT_AMBULATORY_CARE_PROVIDER_SITE_OTHER): Payer: Medicare Other | Admitting: Surgical

## 2022-10-15 DIAGNOSIS — Z9889 Other specified postprocedural states: Secondary | ICD-10-CM

## 2022-10-15 MED ORDER — HYDROMORPHONE HCL 2 MG PO TABS: 1.0000 mg | ORAL_TABLET | ORAL | 0 refills | Status: DC | PRN

## 2022-10-15 MED ORDER — GABAPENTIN 100 MG PO CAPS: 100.0000 mg | ORAL_CAPSULE | Freq: Two times a day (BID) | ORAL | 0 refills | Status: DC

## 2022-10-15 NOTE — Progress Notes (Signed)
Post-Op Visit Note   Patient: Laura [name]           Date of Birth: 06-28-1966           MRN: [id number] Visit Date: 10/15/2022 PCP: Laura Brome, MD   Assessment & Plan:  Chief Complaint: No chief complaint on file.  Visit Diagnoses:  1. S/P right rotator cuff repair     Plan: Laura [name] is a 56 y.o. female who presents s/p right shoulder rotator cuff repair and biceps tenodesis on 10/08/2022.  Patient is doing okay but pain is moderate to severe; still having to take Dilaudid about every 3 hours..  Try to use CPM machine but states that "this is a torture device".  Denies any chest pain, SOB, fevers, chills.  On exam, patient has range of motion evaluation deferred until next appointment due to severe pain..  Intact EPL, FPL, finger abduction, finger adduction, pronation/supination, bicep, tricep, deltoid of operative extremity.  Axillary nerve intact with deltoid firing.  Incisions are healing well without evidence of infection or dehiscence.  Sutures removed and replaced with Steri-Strips today.  2+ radial pulse of the operative extremity  Plan is refill Dilaudid today.  Gabapentin prescribed 100 mg twice daily; she states that she can tolerate a very low dosage but anything higher than that and she has intolerance.  We will hold off on the CPM machine for about a week and have her resume it next Friday.  She will contact us if she has inability to tolerate it at that point.  Follow-up in 2 weeks for clinical recheck with Dr. Marlou [name] and we will initiate physical therapy in [place] per her request.  She was encouraged to avoid active range of motion of the shoulder as well as lifting anything with the operative arm and she understands.  Follow-up in 2 weeks..   Follow-Up Instructions: Return in about 2 weeks (around 10/29/2022).   Orders:  No orders of the defined types were placed in this encounter.  No orders of the defined types were placed in this  encounter.   Imaging: No results found.  PMFS History: Patient Active Problem List   Diagnosis Date Noted   Degenerative superior labral anterior-to-posterior (SLAP) tear of right shoulder 10/09/2022   Biceps tendinitis, right 10/09/2022   Arthritis of right acromioclavicular joint 10/09/2022   Nontraumatic complete tear of right rotator cuff 10/09/2022   S/P right rotator cuff repair 10/08/2022   Primary osteoarthritis of right knee 07/06/2022   Myofascial pain dysfunction syndrome 05/24/2022   Moderate recurrent major depression ([place]) 04/19/2022   Unexplained weight gain 01/11/2022   Allergic rhinitis due to allergen 12/08/2021   Xerostomia 12/06/2021   Hypokalemia 11/28/2021   Moderate persistent asthma with exacerbation 11/28/2021   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   BMI 27.0-27.9,adult 05/01/2021   ADD (attention deficit disorder) 05/01/2021   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/12/2020   Fatty liver 08/11/2017   OSA on CPAP 08/11/2017   Renal atrophy, right 07/18/2017   Recurrent UTI 05/10/2017   Barrett's esophagus 11/02/2016   Memory impairment 10/18/2016   Chronic pain syndrome 10/12/2016   Chronic low back pain (Location of Tertiary source of pain) (Bilateral) (L>R) 10/12/2016   Lumbar facet joint syndrome 10/12/2016   Chronic sacroiliac joint pain 10/12/2016   Chronic shoulder pain (Location of Primary Source of Pain) (Bilateral) (R>L) 10/12/2016   Chronic neck pain (Location of Secondary source of pain) (Bilateral) (R>L) 10/12/2016   Chronic upper back pain (Bilateral) (L>R) 10/12/2016  Chronic hand pain (Bilateral) (R>L) 10/12/2016   Chronic hand pain (Right) 10/12/2016   Chronic hand pain (Left) 10/12/2016   Chronic knee pain (Right) 10/12/2016   CKD (chronic kidney disease) 10/11/2016   Vitamin B12 deficiency 07/26/2016   Anxiety 07/06/2016   Depression 07/06/2016   Raynaud disease 05/06/2016   Fibromyalgia 02/13/2016   Prediabetes 12/23/2015    Heart palpitations 12/16/2015   Heart murmur 12/16/2015   Mixed hyperlipidemia 11/19/2015   Osteoarthrosis, generalized, multiple joints 06/06/2015   Vaginal enterocele    Urinary retention with incomplete bladder emptying    Obesity, Class I, BMI 30.0-34.9 (see actual BMI)    Rectocele    Cystocele    Hypothyroidism, acquired 12/03/2014   Essential hypertension, benign 12/03/2014   Vitamin D insufficiency 12/03/2014   Mild intermittent asthma 12/03/2014   Insomnia 08/31/2013   Past Medical History:  Diagnosis Date   Acute GI bleeding 09/01/2019   Anxiety and depression    Asthma    Bowel obstruction ([place])    Chest pain    a. 01/2016 Ex MV: Hypertensive response. Freq PVCs w/ exercise. nl EF. No ST/T changes. No ischemia.   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 03/2016 Echo: EF 60-65%, no rwma, mild MR, nl LA size, nl RV fxn.   Herpes zoster without complication 39/12/90   High cholesterol    History of hiatal hernia    Hypertension    Kidney stones    Myofascial pain syndrome    Nasal septal perforation 05/12/2018   Hx of cocaine use   Palpitations    a. 03/2016 Holter: Sinus rhythm, avg HR 83, max 123, min 64. 4 PACs. 10,356 isolated PVCs, one vent couplet, 3842 V bigeminy, 4 beats NSVT->prev on BB - dc 2/2 swelling.   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 Took metformin to try to lessen   Raynaud disease    Rectocele    Shingles    Sleep apnea    "mild" per pt   Status post hysterectomy 03/08/2017   Torn rotator cuff    left   Urinary retention with incomplete bladder emptying    Vaginal dryness, menopausal    Vaginal enterocele    Vitamin D deficiency 12/03/2014   Wears hearing aid in both ears     Family History  Problem Relation Age of Onset   Stroke Father     Diabetes Father    Breast cancer Mother 79   Diverticulitis Mother    Esophageal cancer Maternal Grandfather    Colon cancer Paternal Grandmother    Suicidality Brother    Ovarian cancer Neg Hx    Stomach cancer Neg Hx     Past Surgical History:  Procedure Laterality Date   46 HOUR [place] STUDY N/A 10/11/2018   Procedure: 24 HOUR [place] STUDY;  Surgeon: Mauri Pole, MD;  Location: [facility];  Service: Endoscopy;  Laterality: N/A;   ABDOMINAL HYSTERECTOMY     ANKLE SURGERY     ran over by mother in car by [place] TENODESIS Right 10/08/2022   Procedure: BICEPS TENODESIS;  Surgeon: Meredith Pel, MD;  Location: [place];  Service: Orthopedics;  Laterality: Right;   BIOPSY  09/02/2019   Procedure: BIOPSY;  Surgeon: Irving Copas., MD;  Location: [place];  Service: Gastroenterology;;   COLONOSCOPY     COLONOSCOPY WITH PROPOFOL N/A 09/02/2019   Procedure: COLONOSCOPY WITH PROPOFOL;  Surgeon: Irving Copas., MD;  Location: [facility];  Service: Gastroenterology;  Laterality: N/A;   COLPORRHAPHY  2015   posterior and enterocele ligation   CYSTOSCOPY  04/11/2017   Procedure: CYSTOSCOPY;  Surgeon: Defrancesco, Alanda Slim, MD;  Location: [facility];  Service: Gynecology;;   ESOPHAGEAL MANOMETRY N/A 10/11/2018   Procedure: ESOPHAGEAL MANOMETRY (EM);  Surgeon: Mauri Pole, MD;  [facility];  Service: Endoscopy;  Laterality: N/A;   ESOPHAGOGASTRODUODENOSCOPY (EGD) WITH PROPOFOL N/A 09/02/2019   Procedure: ESOPHAGOGASTRODUODENOSCOPY (EGD) WITH PROPOFOL;  Surgeon: Rush Landmark Telford Nab., MD;  Location: [facility];  Service: Gastroenterology;  Laterality: N/A;   EXTRACORPOREAL SHOCK WAVE LITHOTRIPSY Left 07/31/2020   Procedure: EXTRACORPOREAL SHOCK WAVE LITHOTRIPSY (ESWL);  Surgeon: Billey Co, MD;  Location: [facility];  Service: Urology;  Laterality: Left;   KNEE ARTHROSCOPY WITH MEDIAL MENISECTOMY Right 02/04/2020    Procedure: KNEE ARTHROSCOPY WITH PARTIAL LATERAL AND MEDIAL MENISECTOMY,  PARTIAL SYNOVECTOMY AND CHONDROPLASTY;  Surgeon: Lovell Sheehan, MD;  Location: [place];  Service: Orthopedics;  Laterality: Right;   LAPAROSCOPIC SALPINGO OOPHERECTOMY Left 04/11/2017   Procedure: LAPAROSCOPIC LEFT SALPINGO OOPHORECTOMY;  Surgeon: Brayton Mars, MD;  Location: [facility];  Service: Gynecology;  Laterality: Left;   LITHOTRIPSY     OOPHORECTOMY     PARTIAL HYSTERECTOMY     [place] IMPEDANCE STUDY N/A 10/11/2018   Procedure: [place] IMPEDANCE STUDY;  Surgeon: Mauri Pole, MD;  [facility];  Service: Endoscopy;  Laterality: N/A;   PVC ABLATION N/A 01/18/2020   Procedure: PVC ABLATION;  Surgeon: Thompson Grayer, MD;  Location: [facility];  Service: Cardiovascular;  Laterality: N/A;   SHOULDER ARTHROSCOPY WITH OPEN ROTATOR CUFF REPAIR AND DISTAL CLAVICLE ACROMINECTOMY Right 10/08/2022   Procedure: RIGHT SHOULDER ARTHROSCOPY, SUBACROMIAL DECOMPRESSION, MINI OPEN ROTATOR CUFF TEAR REPAIR, ARTHROSCOPIC DISTAL CLAVICLE EXCISION;  Surgeon: Meredith Pel, MD;  Location: [facility];  Service: Orthopedics;  Laterality: Right;   thumb surgery     TONSILLECTOMY     removed as a child   UPPER GASTROINTESTINAL ENDOSCOPY     Social History   Occupational History   Occupation: Disabled  Tobacco Use   Smoking status: Former    Types: Cigarettes    Quit date: 04/08/1995    Years since quitting: 27.5   Smokeless tobacco: Never  Vaping Use   Vaping Use: Never used  Substance and Sexual Activity   Alcohol use: Not Currently    Comment: rare; Maybe one glass of wine once every 6 months   Drug use: No   Sexual activity: Not Currently    Partners: Male    Birth control/protection: Surgical

## 2022-10-15 NOTE — Telephone Encounter (Signed)
[place] forms received. To [place]. 

## 2022-10-17 NOTE — Discharge Summary (Signed)
Physician Discharge Summary      Patient ID: Laura [name] MRN: [id number] DOB/AGE: Jan 25, 1966 56 y.o.  Admit date: 10/08/2022 Discharge date: 10/11/2022  Admission Diagnoses:  Principal Problem:   S/P right rotator cuff repair Active Problems:   Degenerative superior labral anterior-to-posterior (SLAP) tear of right shoulder   Biceps tendinitis, right   Arthritis of right acromioclavicular joint   Nontraumatic complete tear of right rotator cuff   Discharge Diagnoses:  Same  Surgeries: Procedure(s): RIGHT SHOULDER ARTHROSCOPY, SUBACROMIAL DECOMPRESSION, MINI OPEN ROTATOR CUFF TEAR REPAIR, ARTHROSCOPIC DISTAL CLAVICLE EXCISION BICEPS TENODESIS on 10/08/2022   Consultants:   Discharged Condition: Stable  Hospital Course: KACY HEGNA is an 56 y.o. female who was admitted 10/08/2022 with a chief complaint of right shoulder pain, and found to have a diagnosis of right shoulder rotator cuff tear.  They were brought to the operating room on 10/08/2022 and underwent the above named procedures.  Pt awoke from anesthesia without complication and was transferred to the floor. On POD1, patient's pain was controlled until block wore off at which time her pain became fairly uncontrolled.  Her pain control improved over the following days.  She was noted postoperatively to have lower potassium which was supplemented and returned to her baseline level just prior to discharge home on 10/11/2022.Marland Kitchen  Pt will f/u with Dr. Marlou Sa in clinic in ~1 weeks.   Antibiotics given:  Anti-infectives (From admission, onward)    Start     Dose/Rate Route Frequency Ordered Stop   10/08/22 2200  ceFAZolin (ANCEF) IVPB 2g/100 mL premix        2 g 200 mL/hr over 30 Minutes Intravenous Every 8 hours 10/08/22 1736 10/09/22 1749   10/08/22 1523  vancomycin (VANCOCIN) powder  Status:  Discontinued          As needed 10/08/22 1524 10/08/22 1610   10/08/22 1030  ceFAZolin (ANCEF) IVPB 2g/100 mL premix        2  g 200 mL/hr over 30 Minutes Intravenous On call to O.R. 10/08/22 1023 10/08/22 1330     .  Recent vital signs:  Vitals:   10/11/22 0757 10/11/22 1356  BP: (!) 128/91 (!) 133/91  Pulse: 84 (!) 106  Resp: 18 18  Temp: 98 F (36.7 C) 98 F (36.7 C)  SpO2: 92% 92%    Recent laboratory studies:  Results for orders placed or performed during the hospital encounter of 70/26/37  Basic metabolic panel  Result Value Ref Range   Sodium 137 135 - 145 mmol/L   Potassium 2.7 (LL) 3.5 - 5.1 mmol/L   Chloride 92 (L) 98 - 111 mmol/L   CO2 32 22 - 32 mmol/L   Glucose, Bld 111 (H) 70 - 99 mg/dL   BUN 13 6 - 20 mg/dL   Creatinine, Ser 0.71 0.44 - 1.00 mg/dL   Calcium 9.1 8.9 - 10.3 mg/dL   GFR, Estimated >60 >60 mL/min   Anion gap 13 5 - 15  Basic metabolic panel  Result Value Ref Range   Sodium 135 135 - 145 mmol/L   Potassium 3.2 (L) 3.5 - 5.1 mmol/L   Chloride 92 (L) 98 - 111 mmol/L   CO2 29 22 - 32 mmol/L   Glucose, Bld 122 (H) 70 - 99 mg/dL   BUN 11 6 - 20 mg/dL   Creatinine, Ser 0.63 0.44 - 1.00 mg/dL   Calcium 9.3 8.9 - 10.3 mg/dL   GFR, Estimated >60 >60 mL/min   Anion  gap 14 5 - 15    Discharge Medications:   Allergies as of 10/11/2022       Reactions   Meperidine Hives, Rash   Prednisone Anxiety   Severe high anxiety   Shellfish Allergy Shortness Of Breath, Swelling   Shellfish-derived Products Anaphylaxis   Diltiazem Swelling   Singulair [montelukast] Swelling   Swelling all over.    Zetia [ezetimibe] Swelling   Swelling of face.    Acebutolol Other (See Comments), Swelling   Other reaction(s): Unknown   Amlodipine Swelling      Celebrex [celecoxib] Swelling   Patient began taking for knee pain and started swelling (hands, feet, face).    Dexlansoprazole Other (See Comments), Nausea And Vomiting   Abdominal pain   Dronedarone Swelling, Other (See Comments)   Duloxetine Hcl Other (See Comments)   Made pt feel crazy   Flecainide Swelling   Gabapentin Other  (See Comments)   Makes her feel crazy    Lisinopril    swelling   Metoprolol Other (See Comments), Swelling   Mexiletine    Swelling - hands, legs, face   Omeprazole Other (See Comments), Nausea And Vomiting   Abdominal pain   Pregabalin Other (See Comments)   twitch   Savella [milnacipran]    Depression   Sectral [acebutolol Hcl] Swelling   Statins Other (See Comments)   Muscle pain   Tramadol Nausea Only   Unable to sleep, makes her itch   Valsartan Swelling   malaise, fatigue, swelling.   Codeine Hives, Nausea And Vomiting, Rash   Hydralazine Palpitations   Hydrocodone Other (See Comments), Rash   Keeps patient awake.   Ketorolac Tromethamine Itching, Rash   Losartan Rash   Swelling   Mirtazapine Swelling, Rash   Oxycodone Itching, Rash        Medication List     STOP taking these medications    HYDROmorphone 2 MG tablet Commonly known as: Dilaudid       TAKE these medications    Accu-Chek Guide test strip Generic drug: glucose blood 1 each by Other route daily in the afternoon. Use as instructed   Accu-Chek Guide w/Device Kit 1 each by Does not apply route daily.   acetaminophen 325 MG tablet Commonly known as: TYLENOL Take 1-2 tablets (325-650 mg total) by mouth every 6 (six) hours as needed for mild pain (pain score 1-3 or temp > 100.5).   albuterol 108 (90 Base) MCG/ACT inhaler Commonly known as: VENTOLIN HFA Inhale 2 puffs into the lungs every 4 (four) hours as needed for wheezing or shortness of breath.   ALPRAZolam 1 MG tablet Commonly known as: XANAX Take 1 mg by mouth 4 (four) times daily as needed for anxiety.   captopril 25 MG tablet Commonly known as: CAPOTEN Take 1 tablet (25 mg total) by mouth 3 (three) times daily.   chlorthalidone 25 MG tablet Commonly known as: HYGROTON Take 1 tablet (25 mg total) by mouth daily.   clotrimazole-betamethasone cream Commonly known as: Lotrisone Apply 1 Application topically 2 (two) times  daily. What changed:  when to take this reasons to take this   docusate sodium 100 MG capsule Commonly known as: COLACE Take 1 capsule (100 mg total) by mouth 2 (two) times daily.   EPINEPHrine 0.3 mg/0.3 mL Soaj injection Commonly known as: EPI-PEN Inject 0.3 mLs (0.3 mg total) into the muscle as needed for anaphylaxis.   furosemide 20 MG tablet Commonly known as: LASIX Take 1 tablet (20 mg  total) by mouth 2 (two) times daily.   hydrOXYzine 100 MG capsule Commonly known as: VISTARIL Take 1 capsule (100 mg total) by mouth 3 (three) times daily as needed for itching.   ipratropium-albuterol 0.5-2.5 (3) MG/3ML Soln Commonly known as: DUONEB Take 3 mLs by nebulization every 4 (four) hours as needed.   levothyroxine 112 MCG tablet Commonly known as: SYNTHROID Take 1 tablet (112 mcg total) by mouth daily.   niacin 100 MG tablet Commonly known as: VITAMIN B3 Take 200 mg by mouth at bedtime.   nystatin powder Generic drug: nystatin Apply 1 Application topically 2 (two) times daily.   potassium chloride SA 20 MEQ tablet Commonly known as: KLOR-CON M Take 2 tablets (40 mEq total) by mouth 3 (three) times daily.   Repatha 140 MG/ML Sosy Generic drug: Evolocumab Inject 1 mL into the skin every 14 (fourteen) days.   SUMAtriptan 25 MG tablet Commonly known as: IMITREX Take 1 tablet (25 mg total) by mouth every 2 (two) hours as needed for migraine. May repeat in 2 hours if headache persists or recurs.   tiZANidine 4 MG tablet Commonly known as: ZANAFLEX Take 2 tablets (8 mg total) by mouth 2 (two) times daily. For muscle spasms   zolpidem 10 MG tablet Commonly known as: AMBIEN Take 10 mg by mouth at bedtime.        Diagnostic Studies: No results found.  Disposition: Discharge disposition: 01-Home or Self Care       Discharge Instructions     Call MD / Call 911   Complete by: As directed    If you experience chest pain or shortness of breath, CALL 911 and be  transported to the hospital emergency room.  If you develope a fever above 101 F, pus (white drainage) or increased drainage or redness at the wound, or calf pain, call your surgeon's office.   Call MD / Call 911   Complete by: As directed    If you experience chest pain or shortness of breath, CALL 911 and be transported to the hospital emergency room.  If you develope a fever above 101 F, pus (white drainage) or increased drainage or redness at the wound, or calf pain, call your surgeon's office.   Constipation Prevention   Complete by: As directed    Drink plenty of fluids.  Prune juice may be helpful.  You may use a stool softener, such as Colace (over the counter) 100 mg twice a day.  Use MiraLax (over the counter) for constipation as needed.   Constipation Prevention   Complete by: As directed    Drink plenty of fluids.  Prune juice may be helpful.  You may use a stool softener, such as Colace (over the counter) 100 mg twice a day.  Use MiraLax (over the counter) for constipation as needed.   Diet - low sodium heart healthy   Complete by: As directed    Diet - low sodium heart healthy   Complete by: As directed    Discharge instructions   Complete by: As directed    No lifting with right arm Use CPM brace 1 hour 3 times a day Otherwise stay in sling Okay to shower dressing is waterproof   Discharge instructions   Complete by: As directed    Use the CPM machine for one hour tonight.  Starting tomorrow, use the CPM machine 3 times per day for at least one hour each time; increase the degrees of range of motion with  each session as you can tolerate.  No lifting with the operative arm or lifting the arm under its own power.  You may shower, dressings are waterproof.  Use the sling at all times, you may take it off for showering and to use the CPM machine.  You may come out of the sling 1-2 times per day to work on straightening your elbow, to avoid elbow stiffness.  Follow-up with Dr. Marlou Sa in  the clinic at your given appointment date in ~1 week.  Call the office at [phone] with any questions/concerns or if you do not have a CPM machine.   Increase activity slowly as tolerated   Complete by: As directed    Increase activity slowly as tolerated   Complete by: As directed    Post-operative opioid taper instructions:   Complete by: As directed    POST-OPERATIVE OPIOID TAPER INSTRUCTIONS: It is important to wean off of your opioid medication as soon as possible. If you do not need pain medication after your surgery it is ok to stop day one. Opioids include: Codeine, Hydrocodone(Norco, Vicodin), Oxycodone(Percocet, oxycontin) and hydromorphone amongst others.  Long term and even short term use of opiods can cause: Increased pain response Dependence Constipation Depression Respiratory depression And more.  Withdrawal symptoms can include Flu like symptoms Nausea, vomiting And more Techniques to manage these symptoms Hydrate well Eat regular healthy meals Stay active Use relaxation techniques(deep breathing, meditating, yoga) Do Not substitute Alcohol to help with tapering If you have been on opioids for less than two weeks and do not have pain than it is ok to stop all together.  Plan to wean off of opioids This plan should start within one week post op of your joint replacement. Maintain the same interval or time between taking each dose and first decrease the dose.  Cut the total daily intake of opioids by one tablet each day Next start to increase the time between doses. The last dose that should be eliminated is the evening dose.      Post-operative opioid taper instructions:   Complete by: As directed    POST-OPERATIVE OPIOID TAPER INSTRUCTIONS: It is important to wean off of your opioid medication as soon as possible. If you do not need pain medication after your surgery it is ok to stop day one. Opioids include: Codeine, Hydrocodone(Norco, Vicodin),  Oxycodone(Percocet, oxycontin) and hydromorphone amongst others.  Long term and even short term use of opiods can cause: Increased pain response Dependence Constipation Depression Respiratory depression And more.  Withdrawal symptoms can include Flu like symptoms Nausea, vomiting And more Techniques to manage these symptoms Hydrate well Eat regular healthy meals Stay active Use relaxation techniques(deep breathing, meditating, yoga) Do Not substitute Alcohol to help with tapering If you have been on opioids for less than two weeks and do not have pain than it is ok to stop all together.  Plan to wean off of opioids This plan should start within one week post op of your joint replacement. Maintain the same interval or time between taking each dose and first decrease the dose.  Cut the total daily intake of opioids by one tablet each day Next start to increase the time between doses. The last dose that should be eliminated is the evening dose.             SignedAnnie Main 10/17/2022, 9:42 AM

## 2022-10-19 ENCOUNTER — Telehealth: Payer: Self-pay | Admitting: Internal Medicine

## 2022-10-19 LAB — N-METHYLHISTAMINE, 24 HR, U
Collection Duration: 24 h
Creatinine Concent. 24 Hr, U: 43 mg/dL
Creatinine, 24 Hour, U: 1849 mg/24 h — ABNORMAL HIGH (ref 603–1783)
N-Methylhistamine, 24 Hr, U: 79 mcg/g Cr (ref 30–200)
Urine Volume: 4300 mL

## 2022-10-19 NOTE — Telephone Encounter (Signed)
PA for repatha submitted via CMM (Key: [id number])

## 2022-10-23 ENCOUNTER — Other Ambulatory Visit: Payer: Self-pay | Admitting: Family Medicine

## 2022-10-25 LAB — ALLERGEN PROFILE, SHELLFISH
Clam IgE: <0.1 kU/L
F023-IgE Crab: <0.1 kU/L
F080-IgE Lobster: <0.1 kU/L
F290-IgE Oyster: <0.1 kU/L
Scallop IgE: <0.1 kU/L
Shrimp IgE: <0.1 kU/L

## 2022-10-25 LAB — TRYPTASE: Tryptase: 12.2 ug/L (ref 2.2–13.2)

## 2022-10-25 LAB — ALPHA-GAL PANEL
Allergen Lamb IgE: <0.1 kU/L
Beef IgE: <0.1 kU/L
IgE (Immunoglobulin E), Serum: 45 IU/mL (ref 6–495)
O215-IgE Alpha-Gal: <0.1 kU/L
Pork IgE: <0.1 kU/L

## 2022-10-25 LAB — N-METHYLHISTAMINE, 24 HR, U

## 2022-10-25 LAB — C4 COMPLEMENT: Complement C4, Serum: 30 mg/dL (ref 12–38)

## 2022-10-27 ENCOUNTER — Other Ambulatory Visit: Payer: Self-pay | Admitting: Surgical

## 2022-10-27 ENCOUNTER — Encounter: Payer: Self-pay | Admitting: Orthopedic Surgery

## 2022-10-29 ENCOUNTER — Ambulatory Visit (INDEPENDENT_AMBULATORY_CARE_PROVIDER_SITE_OTHER): Payer: Medicare Other | Admitting: Surgical

## 2022-10-29 ENCOUNTER — Encounter: Payer: Self-pay | Admitting: Orthopedic Surgery

## 2022-10-29 DIAGNOSIS — Z9889 Other specified postprocedural states: Secondary | ICD-10-CM

## 2022-10-31 ENCOUNTER — Encounter: Payer: Self-pay | Admitting: Surgical

## 2022-10-31 ENCOUNTER — Other Ambulatory Visit: Payer: Self-pay | Admitting: Surgical

## 2022-10-31 MED ORDER — HYDROMORPHONE HCL 2 MG PO TABS: 1.0000 mg | ORAL_TABLET | Freq: Four times a day (QID) | ORAL | 0 refills | Status: DC | PRN

## 2022-10-31 MED ORDER — GABAPENTIN 100 MG PO CAPS: 100.0000 mg | ORAL_CAPSULE | Freq: Two times a day (BID) | ORAL | 0 refills | Status: DC
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
## 2022-11-04 ENCOUNTER — Encounter: Payer: Self-pay | Admitting: Allergy and Immunology

## 2022-11-06 NOTE — Telephone Encounter (Signed)
Okay for note stating she quarantined in given timeframe prior to surgery

## 2022-11-08 ENCOUNTER — Telehealth: Payer: Self-pay | Admitting: *Deleted

## 2022-11-08 DIAGNOSIS — M25511 Pain in right shoulder: Secondary | ICD-10-CM | POA: Diagnosis not present

## 2022-11-08 DIAGNOSIS — M6281 Muscle weakness (generalized): Secondary | ICD-10-CM | POA: Diagnosis not present

## 2022-11-08 NOTE — Telephone Encounter (Signed)
Called patient to discuss Xolair and she does not have extra help so I will mail app for her to return to me for PAP

## 2022-11-08 NOTE — Telephone Encounter (Signed)
Call made to patient to discuss PAP and will mail app

## 2022-11-12 ENCOUNTER — Encounter
Payer: Medicare Other | Attending: Physical Medicine and Rehabilitation | Admitting: Physical Medicine and Rehabilitation

## 2022-11-12 ENCOUNTER — Encounter: Payer: Self-pay | Admitting: Physical Medicine and Rehabilitation

## 2022-11-12 VITALS — BP 142/87 | HR 97 | Ht 65.0 in | Wt 185.0 lb

## 2022-11-12 DIAGNOSIS — M6281 Muscle weakness (generalized): Secondary | ICD-10-CM | POA: Diagnosis not present

## 2022-11-12 DIAGNOSIS — M1711 Unilateral primary osteoarthritis, right knee: Secondary | ICD-10-CM | POA: Diagnosis not present

## 2022-11-12 DIAGNOSIS — M25511 Pain in right shoulder: Secondary | ICD-10-CM | POA: Diagnosis not present

## 2022-11-12 DIAGNOSIS — G894 Chronic pain syndrome: Secondary | ICD-10-CM | POA: Insufficient documentation

## 2022-11-12 DIAGNOSIS — M7918 Myalgia, other site: Secondary | ICD-10-CM | POA: Insufficient documentation

## 2022-11-12 DIAGNOSIS — M797 Fibromyalgia: Secondary | ICD-10-CM | POA: Diagnosis not present

## 2022-11-12 MED ORDER — TRAZODONE HCL 100 MG PO TABS: 100.0000 mg | ORAL_TABLET | Freq: Every day | ORAL | 0 refills | Status: DC

## 2022-11-12 NOTE — Progress Notes (Signed)
Subjective:    Patient ID: Laura [name], female    DOB: [birth year], 57 y.o.   MRN: [id number]  HPI  Pt is a 57 yr old female with hx of HTN, moderate persistent asthma; hypothyroidism- latest TSH 1.41, fibromyalgia, Migraines, and BMI 31; Told she has Kidney disease? Thinks due to swelling up so much- -  Latest BUN/Cr 22/0.77.  Has many allergies 30 meds- also has IBS Here for evaluation for pain.  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  Also has trochanteric bursitis.  F/U on chronic pain syndrome.      Has torn RTC on L as well R shoulder- 3 tears in R shoulder repaired and "shaved off end of clavicle for impingement syndrome"- Tear "was worse than they thought" and had to do an open shoulder surgery.  10/08/22 was surgery day- Dr Marlou Sa.    Felt very comfortable with him- also did injection in R knee- numbed it before did R knee injection- went well.  Didn't last 2 days- heading for R TKR- "bone on bone"- done gel, and steroids- needs surgery- getting worse because not moving as much.  Was taking q3 hours initially.  Doesn't want to get addicted.  Last Rx 12/31- Sees them again in 2 weeks.  Getting Dilaudid for R shoulder- almost off Dilaudid except with PT.    Had 2nd PT appt today.    This last week, since not walking well- very far- and arm starts to hurt.   Med box- came up missing- for the last week- no Ambien-  Hasn't slept at all in 2 days due to lack of Ambien- people bringing her food, etc and thinks it was stolen.   Ambien to be refilled Monday for Ambien.   Pain Inventory Average Pain 6 Pain Right Now 6 My pain is burning, tingling, and aching  In the last 24 hours, has pain interfered with the following? General activity 6 Relation with others 0 Enjoyment of life 6 What TIME of day is your pain at its worst? evening Sleep (in general) Poor  Pain is worse with: walking, sitting, inactivity, and standing Pain improves with:  therapy/exercise and medication Relief from Meds: 5  Family History  Problem Relation Age of Onset   Stroke Father    Diabetes Father    Breast cancer Mother 66   Diverticulitis Mother    Esophageal cancer Maternal Grandfather    Colon cancer Paternal Grandmother    Suicidality Brother    Ovarian cancer Neg Hx    Stomach cancer Neg Hx    Social History   Socioeconomic History   Marital status: Single    Spouse name: Not on file   Number of children: 2   Years of education: Not on file   Highest education level: Some college, no degree  Occupational History   Occupation: Disabled  Tobacco Use   Smoking status: Former    Types: Cigarettes    Quit date: 04/08/1995    Years since quitting: 27.6   Smokeless tobacco: Never  Vaping Use   Vaping Use: Never used  Substance and Sexual Activity   Alcohol use: Not Currently    Comment: rare; Maybe one glass of wine once every 6 months   Drug use: No   Sexual activity: Not Currently    Partners: Male    Birth control/protection: Surgical  Other Topics Concern   Not on file  Social History Narrative   Separated - 1 son and 1 daughter  Disabled    1 caffeine/day   Past smoker   No EtOH, drugs      08/02/2018      Social Determinants of Health   Financial Resource Strain: Low Risk  (08/05/2021)   Overall Financial Resource Strain (CARDIA)    Difficulty of Paying Living Expenses: Not hard at all  Food Insecurity: Food Insecurity Present (10/13/2022)   Hunger Vital Sign    Worried About Running Out of Food in the Last Year: Sometimes true    Ran Out of Food in the Last Year: Sometimes true  Transportation Needs: No Transportation Needs (10/13/2022)   PRAPARE - Hydrologist (Medical): No    Lack of Transportation (Non-Medical): No  Physical Activity: Sufficiently Active (08/05/2021)   Exercise Vital Sign    Days of Exercise per Week: 6 days    Minutes of Exercise per Session: 60 min  Stress:  No Stress Concern Present (08/05/2021)   [place]    Feeling of Stress : Not at all  Social Connections: Somewhat Isolated (10/13/2018)   Social Connection and Isolation Panel [NHANES]    Frequency of Communication with Friends and Family: More than three times a week    Frequency of Social Gatherings with Friends and Family: Once a week    Attends Religious Services: More than 4 times per year    Active Member of [employer] or Organizations: No    Attends Archivist Meetings: Never    Marital Status: Separated   Past Surgical History:  Procedure Laterality Date   24 HOUR [place] STUDY N/A 10/11/2018   Procedure: [place] STUDY;  Surgeon: Mauri Pole, MD;  [facility]: [facility];  Service: Endoscopy;  Laterality: N/A;   ABDOMINAL HYSTERECTOMY     ANKLE SURGERY     ran over by mother in car by [place] TENODESIS Right 10/08/2022   Procedure: BICEPS TENODESIS;  Surgeon: Meredith Pel, MD;  Location: [place];  Service: Orthopedics;  Laterality: Right;   BIOPSY  09/02/2019   Procedure: BIOPSY;  Surgeon: Rush Landmark Telford Nab., MD;  Location: [place];  Service: Gastroenterology;;   COLONOSCOPY     COLONOSCOPY WITH PROPOFOL N/A 09/02/2019   Procedure: COLONOSCOPY WITH PROPOFOL;  Surgeon: Irving Copas., MD;  Location: [place];  Service: Gastroenterology;  Laterality: N/A;   COLPORRHAPHY  2015   posterior and enterocele ligation   CYSTOSCOPY  04/11/2017   Procedure: CYSTOSCOPY;  Surgeon: Defrancesco, Alanda Slim, MD;  Location: [facility];  Service: Gynecology;;   ESOPHAGEAL MANOMETRY N/A 10/11/2018   Procedure: ESOPHAGEAL MANOMETRY (EM);  Surgeon: Mauri Pole, MD;  [facility];  Service: Endoscopy;  Laterality: N/A;   ESOPHAGOGASTRODUODENOSCOPY (EGD) WITH PROPOFOL N/A 09/02/2019   Procedure: ESOPHAGOGASTRODUODENOSCOPY (EGD) WITH PROPOFOL;  Surgeon:  Rush Landmark Telford Nab., MD;  Location: [place];  Service: Gastroenterology;  Laterality: N/A;   EXTRACORPOREAL SHOCK WAVE LITHOTRIPSY Left 07/31/2020   Procedure: EXTRACORPOREAL SHOCK WAVE LITHOTRIPSY (ESWL);  Surgeon: Billey Co, MD;  Location: [facility];  Service: Urology;  Laterality: Left;   KNEE ARTHROSCOPY WITH MEDIAL MENISECTOMY Right 02/04/2020   Procedure: KNEE ARTHROSCOPY WITH PARTIAL LATERAL AND MEDIAL MENISECTOMY,  PARTIAL SYNOVECTOMY AND CHONDROPLASTY;  Surgeon: Lovell Sheehan, MD;  Location: [place];  Service: Orthopedics;  Laterality: Right;   LAPAROSCOPIC SALPINGO OOPHERECTOMY Left 04/11/2017   Procedure: LAPAROSCOPIC LEFT SALPINGO OOPHORECTOMY;  Surgeon:  Defrancesco, Alanda Slim, MD;  Location: [facility];  Service: Gynecology;  Laterality: Left;   LITHOTRIPSY     OOPHORECTOMY     PARTIAL HYSTERECTOMY     [place] IMPEDANCE STUDY N/A 10/11/2018   Procedure: [place] IMPEDANCE STUDY;  Surgeon: Mauri Pole, MD;  [facility];  Service: Endoscopy;  Laterality: N/A;   PVC ABLATION N/A 01/18/2020   Procedure: PVC ABLATION;  Surgeon: Thompson Grayer, MD;  Location: [place] [facility];  Service: Cardiovascular;  Laterality: N/A;   SHOULDER ARTHROSCOPY WITH OPEN ROTATOR CUFF REPAIR AND DISTAL CLAVICLE ACROMINECTOMY Right 10/08/2022   Procedure: RIGHT SHOULDER ARTHROSCOPY, SUBACROMIAL DECOMPRESSION, MINI OPEN ROTATOR CUFF TEAR REPAIR, ARTHROSCOPIC DISTAL CLAVICLE EXCISION;  Surgeon: Meredith Pel, MD;  Location: [place];  Service: Orthopedics;  Laterality: Right;   thumb surgery     TONSILLECTOMY     removed as a child   UPPER GASTROINTESTINAL ENDOSCOPY     Past Surgical History:  Procedure Laterality Date   5 HOUR [place] STUDY N/A 10/11/2018   Procedure: 24 HOUR [place] STUDY;  Surgeon: Mauri Pole, MD;  [facility];  Service: Endoscopy;  Laterality: N/A;   ABDOMINAL HYSTERECTOMY     ANKLE SURGERY     ran over by mother in car by [place] TENODESIS Right 10/08/2022   Procedure: BICEPS TENODESIS;  Surgeon: Meredith Pel, MD;  Location: [place];  Service: Orthopedics;  Laterality: Right;   BIOPSY  09/02/2019   Procedure: BIOPSY;  Surgeon: Rush Landmark Telford Nab., MD;  Location: [place];  Service: Gastroenterology;;   COLONOSCOPY     COLONOSCOPY WITH PROPOFOL N/A 09/02/2019   Procedure: COLONOSCOPY WITH PROPOFOL;  Surgeon: Irving Copas., MD;  Location: [place];  Service: Gastroenterology;  Laterality: N/A;   COLPORRHAPHY  2015   posterior and enterocele ligation   CYSTOSCOPY  04/11/2017   Procedure: CYSTOSCOPY;  Surgeon: Defrancesco, Alanda Slim, MD;  Location: [facility];  Service: Gynecology;;   ESOPHAGEAL MANOMETRY N/A 10/11/2018   Procedure: ESOPHAGEAL MANOMETRY (EM);  Surgeon: Mauri Pole, MD;  [facility];  Service: Endoscopy;  Laterality: N/A;   ESOPHAGOGASTRODUODENOSCOPY (EGD) WITH PROPOFOL N/A 09/02/2019   Procedure: ESOPHAGOGASTRODUODENOSCOPY (EGD) WITH PROPOFOL;  Surgeon: Rush Landmark Telford Nab., MD;  Location: [place];  Service: Gastroenterology;  Laterality: N/A;   EXTRACORPOREAL SHOCK WAVE LITHOTRIPSY Left 07/31/2020   Procedure: EXTRACORPOREAL SHOCK WAVE LITHOTRIPSY (ESWL);  Surgeon: Billey Co, MD;  Location: [facility];  Service: Urology;  Laterality: Left;   KNEE ARTHROSCOPY WITH MEDIAL MENISECTOMY Right 02/04/2020   Procedure: KNEE ARTHROSCOPY WITH PARTIAL LATERAL AND MEDIAL MENISECTOMY,  PARTIAL SYNOVECTOMY AND CHONDROPLASTY;  Surgeon: Lovell Sheehan, MD;  Location: [place];  Service: Orthopedics;  Laterality: Right;   LAPAROSCOPIC SALPINGO OOPHERECTOMY Left 04/11/2017   Procedure: LAPAROSCOPIC LEFT SALPINGO OOPHORECTOMY;  Surgeon: Brayton Mars, MD;  Location: [facility];  Service: Gynecology;  Laterality: Left;   LITHOTRIPSY     OOPHORECTOMY     PARTIAL HYSTERECTOMY     [place] IMPEDANCE STUDY N/A 10/11/2018   Procedure: [place]  IMPEDANCE STUDY;  Surgeon: Mauri Pole, MD;  [facility];  Service: Endoscopy;  Laterality: N/A;   PVC ABLATION N/A 01/18/2020   Procedure: PVC ABLATION;  Surgeon: Thompson Grayer, MD;  Location: [place] [facility];  Service: Cardiovascular;  Laterality: N/A;   SHOULDER ARTHROSCOPY WITH OPEN ROTATOR CUFF REPAIR AND DISTAL CLAVICLE ACROMINECTOMY Right 10/08/2022   Procedure: RIGHT  SHOULDER ARTHROSCOPY, SUBACROMIAL DECOMPRESSION, MINI OPEN ROTATOR CUFF TEAR REPAIR, ARTHROSCOPIC DISTAL CLAVICLE EXCISION;  Surgeon: Meredith Pel, MD;  Location: [place];  Service: Orthopedics;  Laterality: Right;   thumb surgery     TONSILLECTOMY     removed as a child   UPPER GASTROINTESTINAL ENDOSCOPY     Past Medical History:  Diagnosis Date   Acute GI bleeding 09/01/2019   Anxiety and depression    Asthma    Bowel obstruction ([place])    Chest pain    a. 01/2016 Ex MV: Hypertensive response. Freq PVCs w/ exercise. nl EF. No ST/T changes. No ischemia.   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 03/2016 Echo: EF 60-65%, no rwma, mild MR, nl LA size, nl RV fxn.   Herpes zoster without complication 44/31/5400   High cholesterol    History of hiatal hernia    Hypertension    Kidney stones    Myofascial pain syndrome    Nasal septal perforation 05/12/2018   Hx of cocaine use   Palpitations    a. 03/2016 Holter: Sinus rhythm, avg HR 83, max 123, min 64. 4 PACs. 10,356 isolated PVCs, one vent couplet, 3842 V bigeminy, 4 beats NSVT->prev on BB - dc 2/2 swelling.   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 Took metformin to try to lessen   Raynaud disease    Rectocele    Shingles    Sleep apnea    "mild" per pt   Status post hysterectomy 03/08/2017   Torn rotator cuff    left   Urinary retention with  incomplete bladder emptying    Vaginal dryness, menopausal    Vaginal enterocele    Vitamin D deficiency 12/03/2014   Wears hearing aid in both ears    BP (!) 142/87   Pulse 97   Ht '5\' 5"'$  (1.651 m)   Wt 185 lb (83.9 kg)   SpO2 95%   BMI 30.79 kg/m   Opioid Risk Score:   Fall Risk Score:  `1  Depression screen PHQ 2/9     08/16/2022    2:28 PM 06/11/2022   11:44 AM 05/24/2022    1:32 PM 04/19/2022    8:18 AM 11/24/2021   11:19 AM 08/05/2021    9:24 AM 02/03/2021    7:44 AM  Depression screen PHQ 2/9  Decreased Interest '1 1 3 3 3 '$ 0 1  Down, Depressed, Hopeless '1 1 3 3 3 '$ 0 1  PHQ - 2 Score '2 2 6 6 6 '$ 0 2  Altered sleeping   '3 3 3  3  '$ Tired, decreased energy   '3 3 3  3  '$ Change in appetite   2 2 0  0  Feeling bad or failure about yourself    0 0 2  1  Trouble concentrating   '2 1 3  3  '$ Moving slowly or fidgety/restless   '2 2 2  1  '$ Suicidal thoughts   0 0 0  0  PHQ-9 Score   '18 17 19  13  '$ Difficult doing work/chores   Very difficult Extremely dIfficult Extremely dIfficult  Extremely dIfficult     Review of Systems  Musculoskeletal:  Positive for myalgias.  Fibromyalgia  All other systems reviewed and are negative.     Objective:   Physical Exam  Awake, alert, appropriate, NAD R shoulder in sling Trigger points in neck, upper back- and lower back as well as pecs      Assessment & Plan:   Pt is a 57 yr old female with hx of HTN, moderate persistent asthma; hypothyroidism- latest TSH 1.41, fibromyalgia, Migraines, and BMI 31; Told she has Kidney disease? Thinks due to swelling up so much- -  Latest BUN/Cr 22/0.77.  Has many allergies 30 meds- also has IBS Likely has hypermobility and has significant  arthritis.  Also has trochanteric bursitis.  S/p R RTC surgery 10/08/22; doesn't have mast cell syndrome.       Will write for Trazodone 100-300 mg nightly for sleep until Ambien can be filled on Monday- see if that helps.   2. Cannot do trigger point injections  due to financial reasons. Will see if PT can do dry needling. If not, then will send to [facility] for dry needling.    3.  Send to Dr Glennon Mac- to do OMT- osteopathic manipulation- see if he can see pt soon. Placed referral.   4. Ask PT if they can do dry needling. Usually covered by insurance  5. Allergist doesn't think she has Mast cell syndrome- also tested for food allergies. Put on meds for chronic hives- not allergy meds- Zolair.    6.  F/U in 3 months on chronic pain.     I spent a total of 31   minutes on total care today- >50% coordination of care- due to di/w pt about dry needling; OMT- and d/w pt about options for trazodone- decided on Rx'd med.

## 2022-11-12 NOTE — Patient Instructions (Signed)
Pt is a 56 yr old female with hx of HTN, moderate persistent asthma; hypothyroidism- latest TSH 1.41, fibromyalgia, Migraines, and BMI 31; Told she has Kidney disease? Thinks due to swelling up so much- -  Latest BUN/Cr 22/0.77.  Has many allergies 30 meds- also has IBS Here for evaluation for pain.  Likely has hypermobility and has significant  arthritis.  Also has trochanteric bursitis.  S/p R RTC surgery 10/08/22; doesn't have mast cell syndrome.       Will write for Trazodone 100-300 mg nightly for sleep until Ambien can be filled on Monday- see if that helps.   2. Cannot do trigger point injections due to financial reasons. Will see if PT can do dry needling. If not, then will send to [facility] for dry needling.    3.  Send to Dr Glennon Mac- to do OMT- osteopathic manipulation- see if he can see pt soon. Placed referral.   4. Ask PT if they can do dry needling. Usually covered by insurance  5. Allergist doesn't think she has Mast cell syndrome- also tested for food allergies. Put on meds for chronic hives- not allergy meds- Zolair.    6.  F/U in 3 months on chronic pain.

## 2022-11-16 DIAGNOSIS — M6281 Muscle weakness (generalized): Secondary | ICD-10-CM | POA: Diagnosis not present

## 2022-11-16 DIAGNOSIS — M25511 Pain in right shoulder: Secondary | ICD-10-CM | POA: Diagnosis not present

## 2022-11-18 ENCOUNTER — Telehealth: Payer: Self-pay

## 2022-11-18 NOTE — Progress Notes (Signed)
Subjective:  Patient ID: Laura [name], female    DOB: 28-Feb-1966  Age: 57 y.o. MRN: [id number]  Chief Complaint  Patient presents with   Hyperlipidemia   Hypertension   HPI: Hypertension: Taking Captopril 25 mg TID, Chlorthalidone 25 mg daily, furosemide 20 mg 2 times a day. Bp well controlled. Patient is prone to hypokalemia. On potassium chloride 20 meq 2 oral twice daily.   Hypothyroidism: Currently on Levothyroxine 112 mcg daily.  Insomnia: Zolpidem 10 mg daily.  GAD: Xanax 1 mg four times a day PRN. Not suicidal. Sees psychiatry, Dr. Toy Care. Intolerant to nearly all antidepressants.     10/13/2018    3:39 PM  GAD 7 : Generalized Anxiety Score  Nervous, Anxious, on Edge 3  Control/stop worrying 3  Worry too much - different things 3  Trouble relaxing 3  Restless 3  Easily annoyed or irritable 0  Afraid - awful might happen 0  Total GAD 7 Score 15        11/19/2022    8:14 AM 08/16/2022    2:28 PM 06/11/2022   11:44 AM  PHQ9 SCORE ONLY  PHQ-9 Total Score '18 2 2   '$ Migraines: Takes sumatriptan 25 mg every 2 hours PRN.  Rotator cuff surgery completed in 10/2023. Going to physical therapy. Improving.   Asthma: uses albuterol.   Current Outpatient Medications on File Prior to Visit  Medication Sig Dispense Refill   albuterol (VENTOLIN HFA) 108 (90 Base) MCG/ACT inhaler Inhale 2 puffs into the lungs every 4 (four) hours as needed for wheezing or shortness of breath. 18 g 3   ALPRAZolam (XANAX) 1 MG tablet Take 1 mg by mouth 4 (four) times daily as needed for anxiety.     Blood Glucose Monitoring Suppl (ACCU-CHEK GUIDE) w/Device KIT 1 each by Does not apply route daily. 1 kit 0   captopril (CAPOTEN) 25 MG tablet Take 1 tablet (25 mg total) by mouth 3 (three) times daily. 270 tablet 3   chlorthalidone (HYGROTON) 25 MG tablet Take 1 tablet (25 mg total) by mouth daily. 90 tablet 1   EPINEPHrine 0.3 mg/0.3 mL IJ SOAJ injection Inject 0.3 mLs (0.3 mg total) into the  muscle as needed for anaphylaxis. 1 each 2   Evolocumab (REPATHA) 140 MG/ML SOSY INJECT '140MG'$  SUBCUTANEOUSLY  EVERY 2 WEEKS 6 mL 1   furosemide (LASIX) 20 MG tablet Take 1 tablet (20 mg total) by mouth 2 (two) times daily. 180 tablet 1   gabapentin (NEURONTIN) 100 MG capsule Take 1 capsule (100 mg total) by mouth 2 (two) times daily. 30 capsule 0   glucose blood (ACCU-CHEK GUIDE) test strip 1 each by Other route daily in the afternoon. Use as instructed 100 each 12   HYDROmorphone (DILAUDID) 2 MG tablet Take 0.5 tablets (1 mg total) by mouth every 6 (six) hours as needed for moderate pain (pains score 4-6). (Patient taking differently: Take 2 mg by mouth every 6 (six) hours as needed for moderate pain (pains score 4-6).) 30 tablet 0   hydrOXYzine (VISTARIL) 100 MG capsule Take 1 capsule (100 mg total) by mouth 3 (three) times daily as needed for itching. 30 capsule 0   ipratropium-albuterol (DUONEB) 0.5-2.5 (3) MG/3ML SOLN Take 3 mLs by nebulization every 4 (four) hours as needed. 360 mL 2   levothyroxine (SYNTHROID) 112 MCG tablet Take 1 tablet (112 mcg total) by mouth daily. 90 tablet 1   niacin (VITAMIN B3) 100 MG tablet Take 200 mg by mouth  at bedtime.     nystatin powder Apply 1 Application topically 2 (two) times daily.     potassium chloride SA (KLOR-CON M) 20 MEQ tablet Take 2 tablets (40 mEq total) by mouth 3 (three) times daily. 360 tablet 1   SUMAtriptan (IMITREX) 25 MG tablet Take 1 tablet (25 mg total) by mouth every 2 (two) hours as needed for migraine. May repeat in 2 hours if headache persists or recurs. 10 tablet 3   tiZANidine (ZANAFLEX) 4 MG tablet Take 2 tablets (8 mg total) by mouth 2 (two) times daily. For muscle spasms 120 tablet 5   zolpidem (AMBIEN) 10 MG tablet Take 10 mg by mouth at bedtime.     clotrimazole-betamethasone (LOTRISONE) cream Apply 1 Application topically 2 (two) times daily. (Patient not taking: Reported on 11/19/2022) 30 g 0   No current  facility-administered medications on file prior to visit.   Past Medical History:  Diagnosis Date   Acute GI bleeding 09/01/2019   Anxiety and depression    Asthma    Bowel obstruction ([place])    Chest pain    a. 01/2016 Ex MV: Hypertensive response. Freq PVCs w/ exercise. nl EF. No ST/T changes. No ischemia.   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 03/2016 Echo: EF 60-65%, no rwma, mild MR, nl LA size, nl RV fxn.   Herpes zoster without complication 91/47/8295   High cholesterol    History of hiatal hernia    Hypertension    Kidney stones    Myofascial pain syndrome    Nasal septal perforation 05/12/2018   Hx of cocaine use   Palpitations    a. 03/2016 Holter: Sinus rhythm, avg HR 83, max 123, min 64. 4 PACs. 10,356 isolated PVCs, one vent couplet, 3842 V bigeminy, 4 beats NSVT->prev on BB - dc 2/2 swelling.   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 Took metformin to try to lessen   Raynaud disease    Rectocele    Shingles    Sleep apnea    "mild" per pt   Status post hysterectomy 03/08/2017   Torn rotator cuff    left   Urinary retention with incomplete bladder emptying    Vaginal dryness, menopausal    Vaginal enterocele    Vitamin D deficiency 12/03/2014   Wears hearing aid in both ears    Past Surgical History:  Procedure Laterality Date   16 HOUR [place] STUDY N/A 10/11/2018   Procedure: [place];  Surgeon: Mauri Pole, MD;  Location: [facility];  Service: Endoscopy;  Laterality: N/A;   ABDOMINAL HYSTERECTOMY     ANKLE SURGERY     ran over by mother in car by [place] TENODESIS Right 10/08/2022   Procedure: BICEPS TENODESIS;  Surgeon: Meredith Pel, MD;  Location: [place];  Service: Orthopedics;  Laterality: Right;   BIOPSY  09/02/2019    Procedure: BIOPSY;  Surgeon: Rush Landmark Telford Nab., MD;  Location: [place];  Service: Gastroenterology;;   COLONOSCOPY     COLONOSCOPY WITH PROPOFOL N/A 09/02/2019   Procedure: COLONOSCOPY WITH PROPOFOL;  Surgeon: Irving Copas., MD;  Location: [place];  Service: Gastroenterology;  Laterality: N/A;   COLPORRHAPHY  2015   posterior and enterocele ligation   CYSTOSCOPY  04/11/2017   Procedure: CYSTOSCOPY;  Surgeon: Defrancesco, Alanda Slim, MD;  Location: [facility];  Service: Gynecology;;   ESOPHAGEAL MANOMETRY N/A 10/11/2018   Procedure: ESOPHAGEAL MANOMETRY (EM);  Surgeon: Mauri Pole, MD;  [facility];  Service: Endoscopy;  Laterality: N/A;   ESOPHAGOGASTRODUODENOSCOPY (EGD) WITH PROPOFOL N/A 09/02/2019   Procedure: ESOPHAGOGASTRODUODENOSCOPY (EGD) WITH PROPOFOL;  Surgeon: Rush Landmark Telford Nab., MD;  Location: [place];  Service: Gastroenterology;  Laterality: N/A;   EXTRACORPOREAL SHOCK WAVE LITHOTRIPSY Left 07/31/2020   Procedure: EXTRACORPOREAL SHOCK WAVE LITHOTRIPSY (ESWL);  Surgeon: Billey Co, MD;  Location: [facility];  Service: Urology;  Laterality: Left;   KNEE ARTHROSCOPY WITH MEDIAL MENISECTOMY Right 02/04/2020   Procedure: KNEE ARTHROSCOPY WITH PARTIAL LATERAL AND MEDIAL MENISECTOMY,  PARTIAL SYNOVECTOMY AND CHONDROPLASTY;  Surgeon: Lovell Sheehan, MD;  Location: [place];  Service: Orthopedics;  Laterality: Right;   LAPAROSCOPIC SALPINGO OOPHERECTOMY Left 04/11/2017   Procedure: LAPAROSCOPIC LEFT SALPINGO OOPHORECTOMY;  Surgeon: Brayton Mars, MD;  Location: [facility];  Service: Gynecology;  Laterality: Left;   LITHOTRIPSY     OOPHORECTOMY     PARTIAL HYSTERECTOMY     [place] IMPEDANCE STUDY N/A 10/11/2018   Procedure: [place] IMPEDANCE STUDY;  Surgeon: Mauri Pole, MD;  [facility];  Service: Endoscopy;  Laterality: N/A;   PVC ABLATION N/A 01/18/2020   Procedure: PVC ABLATION;  Surgeon: Thompson Grayer, MD;   Location: [facility];  Service: Cardiovascular;  Laterality: N/A;   SHOULDER ARTHROSCOPY WITH OPEN ROTATOR CUFF REPAIR AND DISTAL CLAVICLE ACROMINECTOMY Right 10/08/2022   Procedure: RIGHT SHOULDER ARTHROSCOPY, SUBACROMIAL DECOMPRESSION, MINI OPEN ROTATOR CUFF TEAR REPAIR, ARTHROSCOPIC DISTAL CLAVICLE EXCISION;  Surgeon: Meredith Pel, MD;  Location: [place];  Service: Orthopedics;  Laterality: Right;   thumb surgery     TONSILLECTOMY     removed as a child   UPPER GASTROINTESTINAL ENDOSCOPY      Family History  Problem Relation Age of Onset   Stroke Father    Diabetes Father    Breast cancer Mother 17   Diverticulitis Mother    Esophageal cancer Maternal Grandfather    Colon cancer Paternal Grandmother    Suicidality Brother    Ovarian cancer Neg Hx    Stomach cancer Neg Hx    Social History   Socioeconomic History   Marital status: Single    Spouse name: Not on file   Number of children: 2   Years of education: Not on file   Highest education level: Some college, no degree  Occupational History   Occupation: Disabled  Tobacco Use   Smoking status: Former    Types: Cigarettes    Quit date: 04/08/1995    Years since quitting: 27.6   Smokeless tobacco: Never  Vaping Use   Vaping Use: Never used  Substance and Sexual Activity   Alcohol use: Not Currently    Comment: rare; Maybe one glass of wine once every 6 months   Drug use: No   Sexual activity: Not Currently    Partners: Male    Birth control/protection: Surgical  Other Topics Concern   Not on file  Social History Narrative   Separated - 1 son and 1 daughter   Disabled    1 caffeine/day   Past smoker   No EtOH, drugs      08/02/2018      Social Determinants of Radio broadcast assistant  Strain: Low Risk  (08/05/2021)   Overall Financial Resource Strain (CARDIA)    Difficulty of Paying Living Expenses: Not hard at all  Food Insecurity: Food Insecurity Present (10/13/2022)   Hunger Vital Sign     Worried About Running Out of Food in the Last Year: Sometimes true    Ran Out of Food in the Last Year: Sometimes true  Transportation Needs: No Transportation Needs (10/13/2022)   PRAPARE - Hydrologist (Medical): No    Lack of Transportation (Non-Medical): No  Physical Activity: Sufficiently Active (08/05/2021)   Exercise Vital Sign    Days of Exercise per Week: 6 days    Minutes of Exercise per Session: 60 min  Stress: No Stress Concern Present (08/05/2021)   [place]    Feeling of Stress : Not at all  Social Connections: Somewhat Isolated (10/13/2018)   Social Connection and Isolation Panel [NHANES]    Frequency of Communication with Friends and Family: More than three times a week    Frequency of Social Gatherings with Friends and Family: Once a week    Attends Religious Services: More than 4 times per year    Active Member of [employer] or Organizations: No    Attends Archivist Meetings: Never    Marital Status: Separated    Review of Systems  Constitutional:  Negative for appetite change, fatigue and fever.  HENT:  Positive for congestion (covid 19 negative. this morning.) and sinus pressure (1 1/2 weeks.). Negative for ear pain and sore throat.   Respiratory:  Negative for cough, chest tightness, shortness of breath and wheezing.   Cardiovascular:  Negative for chest pain and palpitations.  Gastrointestinal:  Positive for diarrhea (IBS flare. diarrhea after you eat. flaring up for 2 weeks. Tried immodium but has not helped.). Negative for abdominal pain, constipation, nausea and vomiting.  Endocrine: Positive for polydipsia, polyphagia and polyuria.  Genitourinary:  Negative for dysuria and hematuria.  Musculoskeletal:  Positive for arthralgias (right shoulder pain) and myalgias. Negative for back pain and joint swelling.  Skin:  Negative for rash.  Neurological:  Negative for  dizziness, weakness and headaches.  Psychiatric/Behavioral:  Positive for sleep disturbance. Negative for dysphoric mood and suicidal ideas. The patient is not nervous/anxious.      Objective:  BP 100/80   Pulse 89   Temp 98.6 F (37 C)   Resp 15   Ht '5\' 5"'$  (1.651 m)   Wt 187 lb (84.8 kg)   SpO2 98%   BMI 31.12 kg/m      11/19/2022    8:07 AM 11/12/2022    2:13 PM 10/11/2022    1:56 PM  BP/Weight  Systolic BP 950 932 671  Diastolic BP 80 87 91  Wt. (Lbs) 187 185   BMI 31.12 kg/m2 30.79 kg/m2     Physical Exam Vitals reviewed.  Constitutional:      Appearance: Normal appearance. She is normal weight.  HENT:     Right Ear: Tympanic membrane normal.     Left Ear: Tympanic membrane normal.     Nose: Congestion present.     Comments: BL maxillary sinus tenderness    Mouth/Throat:     Pharynx: No oropharyngeal exudate or posterior oropharyngeal erythema.  Cardiovascular:     Rate and Rhythm: Normal rate and regular rhythm.     Heart sounds: Normal heart sounds.  Pulmonary:     Effort: Pulmonary effort is  normal.     Breath sounds: Normal breath sounds.  Abdominal:     General: Abdomen is flat. Bowel sounds are normal.     Palpations: Abdomen is soft.     Tenderness: There is abdominal tenderness (diffuse. mild).  Musculoskeletal:     Comments: Right arm in sling.   Neurological:     Mental Status: She is alert and oriented to person, place, and time.  Psychiatric:        Mood and Affect: Mood normal.        Behavior: Behavior normal.     Diabetic Foot Exam - Simple   No data filed      Lab Results  Component Value Date   WBC 6.4 11/19/2022   HGB 13.7 11/19/2022   HCT 42.0 11/19/2022   PLT 379 11/19/2022   GLUCOSE 103 (H) 11/19/2022   CHOL 244 (H) 11/19/2022   TRIG 231 (H) 11/19/2022   HDL 67 11/19/2022   LDLDIRECT 222 (H) 04/26/2019   LDLCALC 136 (H) 11/19/2022   ALT 49 (H) 11/19/2022   AST 51 (H) 11/19/2022   NA 140 11/19/2022   K 3.8  11/19/2022   CL 96 11/19/2022   CREATININE 0.68 11/19/2022   BUN 21 11/19/2022   CO2 26 11/19/2022   TSH 1.410 05/11/2022   INR 0.9 08/11/2017   HGBA1C 6.2 (H) 11/19/2022      Assessment & Plan:   Beena was seen today for hyperlipidemia and hypertension.  Essential hypertension Assessment & Plan: The current medical regimen is effective;  continue present plan and medications. Continue Captopril 25 mg TID, Chlorthalidone 25 mg daily, furosemide 20 mg 2 times a day. Recommend continue to work on eating healthy diet and exercise. Check labs  Orders: -     Comprehensive metabolic panel -     CBC with Differential/Platelet  OSA on CPAP Assessment & Plan: Continue cpap.   Hypothyroidism, acquired Assessment & Plan: Previously well controlled Continue Synthroid at current dose  Recheck TSH and adjust Synthroid as indicated     Moderate recurrent major depression ([place]) Assessment & Plan: [email or web address] Dr. Reece Levy is now doing it. Parish moved to [place].  Look at the bottom of the website and it talks about [place].   Prediabetes Overview: Overview:  Hba1c higher but not diabetic. Took metformin to try to lessen  Assessment & Plan: Recommend continue to work on eating healthy diet and exercise.   Orders: -     Hemoglobin A1c  GAD (generalized anxiety disorder) Assessment & Plan: Management per specialist.     Vitamin B12 deficiency  Mixed hyperlipidemia Assessment & Plan: Not at goal, but much better.  Continue repatha 140 mg IM every 2 weeks.  Continue to work on eating a healthy diet and exercise.  Labs drawn today.    Orders: -     Lipid panel -     Cardiovascular Risk Assessment  Fibromyalgia Assessment & Plan: Not at goal.  Continue current medications. Tizanidine and gabapentin.    Mild intermittent asthma without complication  Drug-induced myopathy Assessment &  Plan: Intolerant to statins.    Irritable bowel syndrome with diarrhea Assessment & Plan: Cipro (so as to also treat sinusitis.) Consider rifaxin course if does not improve.   Orders: -     Ciprofloxacin HCl; Take 1 tablet (500 mg total) by mouth 2 (two) times daily for 10 days.  Dispense: 20 tablet; Refill: 0  Acute non-recurrent sinusitis of other sinus Assessment & Plan: Prescription:  cipro.   Orders: -     Ciprofloxacin HCl; Take 1 tablet (500 mg total) by mouth 2 (two) times daily for 10 days.  Dispense: 20 tablet; Refill: 0     Meds ordered this encounter  Medications   ciprofloxacin (CIPRO) 500 MG tablet    Sig: Take 1 tablet (500 mg total) by mouth 2 (two) times daily for 10 days.    Dispense:  20 tablet    Refill:  0    Orders Placed This Encounter  Procedures   Comprehensive metabolic panel   Hemoglobin A1c   Lipid panel   CBC with Differential/Platelet   Cardiovascular Risk Assessment     Follow-up: Return in about 3 months (around 02/18/2023) for chronic fasting.  An After Visit Summary was printed and given to the patient.  Rochel Brome, MD [facility] [phone]

## 2022-11-18 NOTE — Progress Notes (Cosign Needed)
Patient did not meet requirements to receive assistance for Repatha, patient was notified by [employer].     Laura [name], [place] Pharmacist Assistant [phone]

## 2022-11-19 ENCOUNTER — Ambulatory Visit (INDEPENDENT_AMBULATORY_CARE_PROVIDER_SITE_OTHER): Payer: Medicare Other | Admitting: Family Medicine

## 2022-11-19 ENCOUNTER — Encounter: Payer: Self-pay | Admitting: Family Medicine

## 2022-11-19 VITALS — BP 100/80 | HR 89 | Temp 98.6°F | Resp 15 | Ht 65.0 in | Wt 187.0 lb

## 2022-11-19 DIAGNOSIS — M797 Fibromyalgia: Secondary | ICD-10-CM

## 2022-11-19 DIAGNOSIS — E559 Vitamin D deficiency, unspecified: Secondary | ICD-10-CM

## 2022-11-19 DIAGNOSIS — F419 Anxiety disorder, unspecified: Secondary | ICD-10-CM

## 2022-11-19 DIAGNOSIS — E039 Hypothyroidism, unspecified: Secondary | ICD-10-CM

## 2022-11-19 DIAGNOSIS — K580 Irritable bowel syndrome with diarrhea: Secondary | ICD-10-CM

## 2022-11-19 DIAGNOSIS — F411 Generalized anxiety disorder: Secondary | ICD-10-CM

## 2022-11-19 DIAGNOSIS — E782 Mixed hyperlipidemia: Secondary | ICD-10-CM | POA: Diagnosis not present

## 2022-11-19 DIAGNOSIS — F331 Major depressive disorder, recurrent, moderate: Secondary | ICD-10-CM | POA: Diagnosis not present

## 2022-11-19 DIAGNOSIS — J4520 Mild intermittent asthma, uncomplicated: Secondary | ICD-10-CM

## 2022-11-19 DIAGNOSIS — E538 Deficiency of other specified B group vitamins: Secondary | ICD-10-CM

## 2022-11-19 DIAGNOSIS — J018 Other acute sinusitis: Secondary | ICD-10-CM

## 2022-11-19 DIAGNOSIS — R7303 Prediabetes: Secondary | ICD-10-CM | POA: Diagnosis not present

## 2022-11-19 DIAGNOSIS — G4733 Obstructive sleep apnea (adult) (pediatric): Secondary | ICD-10-CM | POA: Diagnosis not present

## 2022-11-19 DIAGNOSIS — M25511 Pain in right shoulder: Secondary | ICD-10-CM | POA: Diagnosis not present

## 2022-11-19 DIAGNOSIS — G720 Drug-induced myopathy: Secondary | ICD-10-CM

## 2022-11-19 DIAGNOSIS — M6281 Muscle weakness (generalized): Secondary | ICD-10-CM | POA: Diagnosis not present

## 2022-11-19 DIAGNOSIS — I10 Essential (primary) hypertension: Secondary | ICD-10-CM | POA: Diagnosis not present

## 2022-11-19 MED ORDER — CIPROFLOXACIN HCL 500 MG PO TABS: 500.0000 mg | ORAL_TABLET | Freq: Two times a day (BID) | ORAL | 0 refills | Status: AC

## 2022-11-19 NOTE — Patient Instructions (Addendum)
[email or web address] Dr. Reece Levy is now doing it. Parish moved to [place].  Look at the bottom of the website and it talks about [place].  Cipro prescription for sinusitis and IBS.

## 2022-11-20 DIAGNOSIS — K589 Irritable bowel syndrome without diarrhea: Secondary | ICD-10-CM | POA: Insufficient documentation

## 2022-11-20 LAB — COMPREHENSIVE METABOLIC PANEL
ALT: 49 IU/L — ABNORMAL HIGH (ref 0–32)
AST: 51 IU/L — ABNORMAL HIGH (ref 0–40)
Albumin/Globulin Ratio: 1.8 (ref 1.2–2.2)
Albumin: 4.7 g/dL (ref 3.8–4.9)
Alkaline Phosphatase: 87 IU/L (ref 44–121)
BUN/Creatinine Ratio: 31 — ABNORMAL HIGH (ref 9–23)
BUN: 21 mg/dL (ref 6–24)
Bilirubin Total: 0.3 mg/dL (ref 0.0–1.2)
CO2: 26 mmol/L (ref 20–29)
Calcium: 9.7 mg/dL (ref 8.7–10.2)
Chloride: 96 mmol/L (ref 96–106)
Creatinine, Ser: 0.68 mg/dL (ref 0.57–1.00)
Globulin, Total: 2.6 g/dL (ref 1.5–4.5)
Glucose: 103 mg/dL — ABNORMAL HIGH (ref 70–99)
Potassium: 3.8 mmol/L (ref 3.5–5.2)
Sodium: 140 mmol/L (ref 134–144)
Total Protein: 7.3 g/dL (ref 6.0–8.5)
eGFR: 102 mL/min/{1.73_m2} (ref >59)

## 2022-11-20 LAB — CBC WITH DIFFERENTIAL/PLATELET
Basophils Absolute: 0 10*3/uL (ref 0.0–0.2)
Basos: 1 %
EOS (ABSOLUTE): 0.2 10*3/uL (ref 0.0–0.4)
Eos: 3 %
Hematocrit: 42 % (ref 34.0–46.6)
Hemoglobin: 13.7 g/dL (ref 11.1–15.9)
Immature Grans (Abs): 0 10*3/uL (ref 0.0–0.1)
Immature Granulocytes: 0 %
Lymphocytes Absolute: 1.5 10*3/uL (ref 0.7–3.1)
Lymphs: 23 %
MCH: 27.9 pg (ref 26.6–33.0)
MCHC: 32.6 g/dL (ref 31.5–35.7)
MCV: 86 fL (ref 79–97)
Monocytes Absolute: 0.5 10*3/uL (ref 0.1–0.9)
Monocytes: 8 %
Neutrophils Absolute: 4.2 10*3/uL (ref 1.4–7.0)
Neutrophils: 65 %
Platelets: 379 10*3/uL (ref 150–450)
RBC: 4.91 x10E6/uL (ref 3.77–5.28)
RDW: 13.6 % (ref 11.7–15.4)
WBC: 6.4 10*3/uL (ref 3.4–10.8)

## 2022-11-20 LAB — LIPID PANEL
Chol/HDL Ratio: 3.6 ratio (ref 0.0–4.4)
Cholesterol, Total: 244 mg/dL — ABNORMAL HIGH (ref 100–199)
HDL: 67 mg/dL (ref >39)
LDL Chol Calc (NIH): 136 mg/dL — ABNORMAL HIGH (ref 0–99)
Triglycerides: 231 mg/dL — ABNORMAL HIGH (ref 0–149)
VLDL Cholesterol Cal: 41 mg/dL — ABNORMAL HIGH (ref 5–40)

## 2022-11-20 LAB — HEMOGLOBIN A1C
Est. average glucose Bld gHb Est-mCnc: 131 mg/dL
Hgb A1c MFr Bld: 6.2 % — ABNORMAL HIGH (ref 4.8–5.6)

## 2022-11-20 LAB — CARDIOVASCULAR RISK ASSESSMENT

## 2022-11-20 NOTE — Assessment & Plan Note (Signed)
Not at goal.  Continue current medications. Tizanidine and gabapentin.

## 2022-11-20 NOTE — Assessment & Plan Note (Signed)
Cipro (so as to also treat sinusitis.) Consider rifaxin course if does not improve.

## 2022-11-20 NOTE — Assessment & Plan Note (Signed)
Recommend continue to work on eating healthy diet and exercise.  

## 2022-11-20 NOTE — Assessment & Plan Note (Signed)
Previously well controlled Continue Synthroid at current dose  Recheck TSH and adjust Synthroid as indicated   

## 2022-11-20 NOTE — Assessment & Plan Note (Signed)
The current medical regimen is effective;  continue present plan and medications. Continue Captopril 25 mg TID, Chlorthalidone 25 mg daily, furosemide 20 mg 2 times a day. Recommend continue to work on eating healthy diet and exercise. Check labs

## 2022-11-20 NOTE — Assessment & Plan Note (Signed)
Not at goal, but much better.  Continue repatha 140 mg IM every 2 weeks.  Continue to work on eating a healthy diet and exercise.  Labs drawn today.

## 2022-11-20 NOTE — Assessment & Plan Note (Signed)
[email or web address] Dr. Reece Levy is now doing it. Parish moved to [place].  Look at the bottom of the website and it talks about [place].

## 2022-11-20 NOTE — Assessment & Plan Note (Signed)
Management per specialist. 

## 2022-11-20 NOTE — Assessment & Plan Note (Signed)
Prescription: cipro.

## 2022-11-20 NOTE — Progress Notes (Signed)
Blood count normal.  Liver function abnormal. Mildly up.  Kidney function normal.  Cholesterol:LDL improved from 254 down to 136. Triglycerides worsened. HDL very good. Continue Repatha 140 mg once every 14 days.  Recommend continue to work on eating healthy diet and exercise.  HBA1C: 6.2. improved.

## 2022-11-20 NOTE — Assessment & Plan Note (Signed)
Continue cpap.  

## 2022-11-20 NOTE — Assessment & Plan Note (Signed)
Intolerant to statins. 

## 2022-11-22 DIAGNOSIS — M25511 Pain in right shoulder: Secondary | ICD-10-CM | POA: Diagnosis not present

## 2022-11-22 DIAGNOSIS — M6281 Muscle weakness (generalized): Secondary | ICD-10-CM | POA: Diagnosis not present

## 2022-11-26 DIAGNOSIS — M6281 Muscle weakness (generalized): Secondary | ICD-10-CM | POA: Diagnosis not present

## 2022-11-26 DIAGNOSIS — M25511 Pain in right shoulder: Secondary | ICD-10-CM | POA: Diagnosis not present

## 2022-11-29 ENCOUNTER — Encounter: Payer: Medicare Other | Admitting: Orthopedic Surgery

## 2022-11-29 DIAGNOSIS — M6281 Muscle weakness (generalized): Secondary | ICD-10-CM | POA: Diagnosis not present

## 2022-11-29 DIAGNOSIS — M25511 Pain in right shoulder: Secondary | ICD-10-CM | POA: Diagnosis not present

## 2022-12-02 NOTE — Progress Notes (Signed)
Benito Mccreedy D.[facility] [place] Phone: [phone]   Assessment and Plan:     1. Myofascial pain dysfunction syndrome 2. Fibromyalgia 3. Chronic pain syndrome 4. Chronic low back pain (Location of Tertiary source of pain) (Bilateral) (L>R) 5. Chronic upper back pain (Bilateral) (L>R) 6. Chronic neck pain (Location of Secondary source of pain) (Bilateral) (R>L)  -Chronic with exacerbation, initial sports medicine visit - Patient has a complicated past medical history and significant musculoskeletal history which include myofascial pain syndrome, fibromyalgia, chronic pain syndrome, chronic neck, thoracic, low back pain.  Patient sees Dr. Dagoberto Ligas for these conditions - I feel patient could benefit from OMT and trigger point injections.  Patient will reach out to her insurance company to see if they cover these treatments - I feel patient could benefit from ongoing physical therapy and dry needling for her multiple conditions.  Patient will reach out to her insurance company to see if they cover these treatments - Recommend getting cushioned inserts or pair of cushioned shoes to wear at work. Synetta Shadow is a type of shoe with wide toe box that may be beneficial.  Could also try sporting store such as [employer] which could help guide your purchase. -Recommend physical activity to decrease his foot strike including stationary bike, elliptical, water aerobics.   Pertinent previous records reviewed include PMNR note 11/12/2022   Follow-up as needed.  Once you find out if insurance will cover our treatments, I recommend calling us back and scheduling a follow-up visit.  Thank you.    Subjective:   I, Pincus Badder, am serving as a Education administrator for Doctor Glennon Mac  Chief Complaint: body pain  HPI:   12/03/2022 Patient is a 57 year old female complaining of body pain. Patient states she has fibromyalgia and myofascial pain syndrome and dr.  Sampson Si referred her over , 8 weeks today she had RTC surgery,   Relevant Historical Information: Chronic pain syndrome, myofascial pain syndrome, fibromyalgia, recent right rotator cuff surgery  Additional pertinent review of systems negative.  Current Outpatient Medications  Medication Sig Dispense Refill   albuterol (VENTOLIN HFA) 108 (90 Base) MCG/ACT inhaler Inhale 2 puffs into the lungs every 4 (four) hours as needed for wheezing or shortness of breath. 18 g 3   ALPRAZolam (XANAX) 1 MG tablet Take 1 mg by mouth 4 (four) times daily as needed for anxiety.     Blood Glucose Monitoring Suppl (ACCU-CHEK GUIDE) w/Device KIT 1 each by Does not apply route daily. 1 kit 0   captopril (CAPOTEN) 25 MG tablet Take 1 tablet (25 mg total) by mouth 3 (three) times daily. 270 tablet 3   chlorthalidone (HYGROTON) 25 MG tablet Take 1 tablet (25 mg total) by mouth daily. 90 tablet 1   clotrimazole-betamethasone (LOTRISONE) cream Apply 1 Application topically 2 (two) times daily. (Patient not taking: Reported on 11/19/2022) 30 g 0   EPINEPHrine 0.3 mg/0.3 mL IJ SOAJ injection Inject 0.3 mLs (0.3 mg total) into the muscle as needed for anaphylaxis. 1 each 2   Evolocumab (REPATHA) 140 MG/ML SOSY INJECT '140MG'$  SUBCUTANEOUSLY  EVERY 2 WEEKS 6 mL 1   furosemide (LASIX) 20 MG tablet Take 1 tablet (20 mg total) by mouth 2 (two) times daily. 180 tablet 1   gabapentin (NEURONTIN) 100 MG capsule Take 1 capsule (100 mg total) by mouth 2 (two) times daily. 30 capsule 0   glucose blood (ACCU-CHEK GUIDE) test strip 1 each by  Other route daily in the afternoon. Use as instructed 100 each 12   HYDROmorphone (DILAUDID) 2 MG tablet Take 0.5 tablets (1 mg total) by mouth every 6 (six) hours as needed for moderate pain (pains score 4-6). (Patient taking differently: Take 2 mg by mouth every 6 (six) hours as needed for moderate pain (pains score 4-6).) 30 tablet 0   hydrOXYzine (VISTARIL) 100 MG capsule Take 1 capsule (100 mg  total) by mouth 3 (three) times daily as needed for itching. 30 capsule 0   ipratropium-albuterol (DUONEB) 0.5-2.5 (3) MG/3ML SOLN Take 3 mLs by nebulization every 4 (four) hours as needed. 360 mL 2   levothyroxine (SYNTHROID) 112 MCG tablet Take 1 tablet (112 mcg total) by mouth daily. 90 tablet 1   niacin (VITAMIN B3) 100 MG tablet Take 200 mg by mouth at bedtime.     nystatin powder Apply 1 Application topically 2 (two) times daily.     potassium chloride SA (KLOR-CON M) 20 MEQ tablet Take 2 tablets (40 mEq total) by mouth 3 (three) times daily. 360 tablet 1   SUMAtriptan (IMITREX) 25 MG tablet Take 1 tablet (25 mg total) by mouth every 2 (two) hours as needed for migraine. May repeat in 2 hours if headache persists or recurs. 10 tablet 3   tiZANidine (ZANAFLEX) 4 MG tablet Take 2 tablets (8 mg total) by mouth 2 (two) times daily. For muscle spasms 120 tablet 5   zolpidem (AMBIEN) 10 MG tablet Take 10 mg by mouth at bedtime.     No current facility-administered medications for this visit.      Objective:     Vitals:   12/03/22 1343  BP: 130/78  Pulse: 98  SpO2: 99%  Weight: 191 lb (86.6 kg)  Height: '5\' 5"'$  (1.651 m)      Body mass index is 31.78 kg/m.    Physical Exam:     General: Well-appearing, cooperative, sitting comfortably in no acute distress.  HEENT: Normocephalic, atraumatic.  Neck: No gross abnormality.  Cardiovascular: No pallor or cyanosis. Resp: Comfortable WOB.   Abdomen: Non distended.   Skin: Warm and dry; no focal rashes identified on limited exam. Extremities: No cyanosis or edema.  Neuro: Gross motor and sensory intact. Gait normal. Psychiatric: Mood and affect are appropriate and sad at times related to her chronic pain  Electronically signed by:  Benito Mccreedy D.[facility] 2:12 PM 12/03/22

## 2022-12-03 ENCOUNTER — Ambulatory Visit: Payer: Medicare Other | Admitting: Sports Medicine

## 2022-12-03 ENCOUNTER — Ambulatory Visit (INDEPENDENT_AMBULATORY_CARE_PROVIDER_SITE_OTHER): Payer: Medicare Other | Admitting: Orthopedic Surgery

## 2022-12-03 VITALS — BP 130/78 | HR 98 | Ht 65.0 in | Wt 191.0 lb

## 2022-12-03 DIAGNOSIS — M545 Low back pain, unspecified: Secondary | ICD-10-CM

## 2022-12-03 DIAGNOSIS — G894 Chronic pain syndrome: Secondary | ICD-10-CM | POA: Diagnosis not present

## 2022-12-03 DIAGNOSIS — M7918 Myalgia, other site: Secondary | ICD-10-CM | POA: Diagnosis not present

## 2022-12-03 DIAGNOSIS — Z9889 Other specified postprocedural states: Secondary | ICD-10-CM

## 2022-12-03 DIAGNOSIS — M542 Cervicalgia: Secondary | ICD-10-CM | POA: Diagnosis not present

## 2022-12-03 DIAGNOSIS — G8929 Other chronic pain: Secondary | ICD-10-CM | POA: Diagnosis not present

## 2022-12-03 DIAGNOSIS — M797 Fibromyalgia: Secondary | ICD-10-CM | POA: Diagnosis not present

## 2022-12-03 DIAGNOSIS — M549 Dorsalgia, unspecified: Secondary | ICD-10-CM

## 2022-12-03 NOTE — Patient Instructions (Addendum)
Good to see you   Recommend reaching out to your insurance company to see if they cover the following:  - OMT (osteopathic manipulative treatment) performed by Dr. Glennon Mac, a family medicine trained, sports medicine fellowship D.O. (osteopathic physician) - Trigger point injections performed by Dr. Glennon Mac, a family medicine trained, sports medicine fellowship D.O. (osteopathic physician) - Physical therapy including dry needling for chronic conditions including fibromyalgia, myofascial pain syndrome, chronic low back pain, chronic thoracic back pain, chronic neck pain.  External referral for physical therapy provided today  Recommend getting cushioned inserts or pair of cushioned shoes to wear at work. Synetta Shadow is a type of shoe with wide toe box that may be beneficial.  Could also try sporting store such as [employer] which could help guide your purchase.  Recommend physical activity to decrease his foot strike including stationary bike, elliptical, water aerobics.  Follow-up as needed.  Once you find out if insurance will cover our treatments, I recommend calling us back and scheduling a follow-up visit.  Thank you.

## 2022-12-04 ENCOUNTER — Encounter: Payer: Self-pay | Admitting: Orthopedic Surgery

## 2022-12-04 NOTE — Progress Notes (Signed)
Post-Op Visit Note   Patient: Laura [name]           Date of Birth: [birth year]           MRN: [id number] Visit Date: 12/03/2022 PCP: Laura Brome, MD   Assessment & Plan:  Chief Complaint:  Chief Complaint  Patient presents with   Right Shoulder - Routine Post Op    10/08/22 (8w)Right Shoulder Arthroscopy, Subacromial Decompression, Mini Open Rotator Cuff Tear Repair, Arthroscopic Distal Clavicle Excision; Biceps Tenodesis - Right      Visit Diagnoses:  1. S/P right rotator cuff repair     Plan: Laura [name] is a patient who underwent right shoulder arthroscopy rotator cuff tear repair and distal clavicle excision and biceps tenodesis 10/08/2022.  Therapy 2 times a week.  Doing home exercise program.  Pain is tolerable.  On examination her range of motion is 60/95/150.  Good rotator cuff strength.  No coarse grinding or crepitus with internal/external rotation of the right shoulder.  At this time I think she is okay to start in 2 weeks back at close but just doing product scanning.  4-week return for clinical recheck.  Okay to start strengthening.  No pain meds currently  Follow-Up Instructions: No follow-ups on file.   Orders:  No orders of the defined types were placed in this encounter.  No orders of the defined types were placed in this encounter.   Imaging: No results found.  PMFS History: Patient Active Problem List   Diagnosis Date Noted   Irritable bowel syndrome (IBS) 11/20/2022   Degenerative superior labral anterior-to-posterior (SLAP) tear of right shoulder 10/09/2022   Biceps tendinitis, right 10/09/2022   Arthritis of right acromioclavicular joint 10/09/2022   Nontraumatic complete tear of right rotator cuff 10/09/2022   S/P right rotator cuff repair 10/08/2022   Primary osteoarthritis of right knee 07/06/2022   Myofascial pain dysfunction syndrome 05/24/2022   Moderate recurrent major depression (Laura [name]) 04/19/2022   Unexplained weight gain 01/11/2022    Allergic rhinitis due to allergen 12/08/2021   Xerostomia 12/06/2021   Hypokalemia 11/28/2021   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   Other acute sinusitis 08/05/2021   BMI 27.0-27.9,adult 05/01/2021   ADD (attention deficit disorder) 05/01/2021   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/12/2020   Fatty liver 08/11/2017   OSA on CPAP 08/11/2017   Renal atrophy, right 07/18/2017   Barrett's esophagus 11/02/2016   Memory impairment 10/18/2016   Chronic pain syndrome 10/12/2016   Chronic low back pain (Location of Tertiary source of pain) (Bilateral) (L>R) 10/12/2016   Lumbar facet joint syndrome 10/12/2016   Chronic sacroiliac joint pain 10/12/2016   Chronic shoulder pain (Location of Primary Source of Pain) (Bilateral) (R>L) 10/12/2016   Chronic neck pain (Location of Secondary source of pain) (Bilateral) (R>L) 10/12/2016   Chronic upper back pain (Bilateral) (L>R) 10/12/2016   Chronic hand pain (Bilateral) (R>L) 10/12/2016   Chronic hand pain (Right) 10/12/2016   Chronic hand pain (Left) 10/12/2016   Chronic knee pain (Right) 10/12/2016   CKD (chronic kidney disease) 10/11/2016   Vitamin B12 deficiency 07/26/2016   GAD (generalized anxiety disorder) 07/06/2016   Depression 07/06/2016   Raynaud disease 05/06/2016   Fibromyalgia 02/13/2016   Prediabetes 12/23/2015   Heart palpitations 12/16/2015   Heart murmur 12/16/2015   Mixed hyperlipidemia 11/19/2015   Osteoarthrosis, generalized, multiple joints 06/06/2015   Vaginal enterocele    Urinary retention with incomplete bladder emptying    Obesity, Class I, BMI 30.0-34.9 (see actual BMI)  Rectocele    Cystocele    Hypothyroidism, acquired 12/03/2014   Essential hypertension 12/03/2014   Mild intermittent asthma 12/03/2014   Insomnia 08/31/2013   Past Medical History:  Diagnosis Date   Acute GI bleeding 09/01/2019   Anxiety and depression    Asthma    Bowel obstruction (HCC)    Chest pain    a. 01/2016 Ex MV: Hypertensive  response. Freq PVCs w/ exercise. nl EF. No ST/T changes. No ischemia.   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 03/2016 Echo: EF 60-65%, no rwma, mild MR, nl LA size, nl RV fxn.   Herpes zoster without complication 40/98/1191   High cholesterol    History of hiatal hernia    Hypertension    Kidney stones    Myofascial pain syndrome    Nasal septal perforation 05/12/2018   Hx of cocaine use   Palpitations    a. 03/2016 Holter: Sinus rhythm, avg HR 83, max 123, min 64. 4 PACs. 10,356 isolated PVCs, one vent couplet, 3842 V bigeminy, 4 beats NSVT->prev on BB - dc 2/2 swelling.   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 Took metformin to try to lessen   Raynaud disease    Rectocele    Shingles    Sleep apnea    "mild" per pt   Status post hysterectomy 03/08/2017   Torn rotator cuff    left   Urinary retention with incomplete bladder emptying    Vaginal dryness, menopausal    Vaginal enterocele    Vitamin D deficiency 12/03/2014   Wears hearing aid in both ears     Family History  Problem Relation Age of Onset   Stroke Father    Diabetes Father    Breast cancer Mother 43   Diverticulitis Mother    Esophageal cancer Maternal Grandfather    Colon cancer Paternal Grandmother    Suicidality Brother    Ovarian cancer Neg Hx    Stomach cancer Neg Hx     Past Surgical History:  Procedure Laterality Date   49 HOUR [place] STUDY N/A 10/11/2018   Procedure: 24 HOUR PH STUDY;  Surgeon: Mauri Pole, MD;  Location: [facility];  Service: Endoscopy;  Laterality: N/A;   ABDOMINAL HYSTERECTOMY     ANKLE SURGERY     ran over by mother in car by [place] TENODESIS Right 10/08/2022   Procedure: BICEPS TENODESIS;  Surgeon: Meredith Pel, MD;  Location: [facility];  Service:  Orthopedics;  Laterality: Right;   BIOPSY  09/02/2019   Procedure: BIOPSY;  Surgeon: Rush Landmark Telford Nab., MD;  Location: [facility];  Service: Gastroenterology;;   COLONOSCOPY     COLONOSCOPY WITH PROPOFOL N/A 09/02/2019   Procedure: COLONOSCOPY WITH PROPOFOL;  Surgeon: Irving Copas., MD;  Location: [facility];  Service: Gastroenterology;  Laterality: N/A;   COLPORRHAPHY  2015   posterior and enterocele ligation   CYSTOSCOPY  04/11/2017   Procedure: CYSTOSCOPY;  Surgeon: Defrancesco, Alanda Slim, MD;  Location: [facility];  Service: Gynecology;;   ESOPHAGEAL MANOMETRY N/A 10/11/2018   Procedure: ESOPHAGEAL MANOMETRY (EM);  Surgeon: Mauri Pole, MD;  Location: [facility];  Service: Endoscopy;  Laterality: N/A;   ESOPHAGOGASTRODUODENOSCOPY (  EGD) WITH PROPOFOL N/A 09/02/2019   Procedure: ESOPHAGOGASTRODUODENOSCOPY (EGD) WITH PROPOFOL;  Surgeon: Rush Landmark Telford Nab., MD;  Location: [place];  Service: Gastroenterology;  Laterality: N/A;   EXTRACORPOREAL SHOCK WAVE LITHOTRIPSY Left 07/31/2020   Procedure: EXTRACORPOREAL SHOCK WAVE LITHOTRIPSY (ESWL);  Surgeon: Billey Co, MD;  Location: [facility];  Service: Urology;  Laterality: Left;   KNEE ARTHROSCOPY WITH MEDIAL MENISECTOMY Right 02/04/2020   Procedure: KNEE ARTHROSCOPY WITH PARTIAL LATERAL AND MEDIAL MENISECTOMY,  PARTIAL SYNOVECTOMY AND CHONDROPLASTY;  Surgeon: Lovell Sheehan, MD;  Location: [facility];  Service: Orthopedics;  Laterality: Right;   LAPAROSCOPIC SALPINGO OOPHERECTOMY Left 04/11/2017   Procedure: LAPAROSCOPIC LEFT SALPINGO OOPHORECTOMY;  Surgeon: Brayton Mars, MD;  Location: [facility];  Service: Gynecology;  Laterality: Left;   LITHOTRIPSY     OOPHORECTOMY     PARTIAL HYSTERECTOMY     [place] IMPEDANCE STUDY N/A 10/11/2018   Procedure: [place] IMPEDANCE STUDY;  Surgeon: Mauri Pole, MD;  Location: [facility];  Service: Endoscopy;  Laterality: N/A;   PVC ABLATION N/A  01/18/2020   Procedure: PVC ABLATION;  Surgeon: Thompson Grayer, MD;  Location: [facility];  Service: Cardiovascular;  Laterality: N/A;   SHOULDER ARTHROSCOPY WITH OPEN ROTATOR CUFF REPAIR AND DISTAL CLAVICLE ACROMINECTOMY Right 10/08/2022   Procedure: RIGHT SHOULDER ARTHROSCOPY, SUBACROMIAL DECOMPRESSION, MINI OPEN ROTATOR CUFF TEAR REPAIR, ARTHROSCOPIC DISTAL CLAVICLE EXCISION;  Surgeon: Meredith Pel, MD;  Location: [facility];  Service: Orthopedics;  Laterality: Right;   thumb surgery     TONSILLECTOMY     removed as a child   UPPER GASTROINTESTINAL ENDOSCOPY     Social History   Occupational History   Occupation: Disabled  Tobacco Use   Smoking status: Former    Types: Cigarettes    Quit date: 04/08/1995    Years since quitting: 27.6   Smokeless tobacco: Never  Vaping Use   Vaping Use: Never used  Substance and Sexual Activity   Alcohol use: Not Currently    Comment: rare; Maybe one glass of wine once every 6 months   Drug use: No   Sexual activity: Not Currently    Partners: Male    Birth control/protection: Surgical

## 2022-12-06 DIAGNOSIS — M25511 Pain in right shoulder: Secondary | ICD-10-CM | POA: Diagnosis not present

## 2022-12-06 DIAGNOSIS — M6281 Muscle weakness (generalized): Secondary | ICD-10-CM | POA: Diagnosis not present

## 2022-12-09 ENCOUNTER — Telehealth: Payer: Self-pay | Admitting: *Deleted

## 2022-12-09 NOTE — Telephone Encounter (Signed)
L/m for patient to advise PAP approval for freed Xolair and delivery 2/14 and she can call and make appt to start therapy

## 2022-12-10 DIAGNOSIS — M6281 Muscle weakness (generalized): Secondary | ICD-10-CM | POA: Diagnosis not present

## 2022-12-10 DIAGNOSIS — M25511 Pain in right shoulder: Secondary | ICD-10-CM | POA: Diagnosis not present

## 2022-12-13 DIAGNOSIS — N261 Atrophy of kidney (terminal): Secondary | ICD-10-CM | POA: Diagnosis not present

## 2022-12-13 DIAGNOSIS — I10 Essential (primary) hypertension: Secondary | ICD-10-CM | POA: Diagnosis not present

## 2022-12-13 DIAGNOSIS — M6281 Muscle weakness (generalized): Secondary | ICD-10-CM | POA: Diagnosis not present

## 2022-12-13 DIAGNOSIS — Z87442 Personal history of urinary calculi: Secondary | ICD-10-CM | POA: Diagnosis not present

## 2022-12-13 DIAGNOSIS — R600 Localized edema: Secondary | ICD-10-CM | POA: Diagnosis not present

## 2022-12-13 DIAGNOSIS — M25511 Pain in right shoulder: Secondary | ICD-10-CM | POA: Diagnosis not present

## 2022-12-16 ENCOUNTER — Ambulatory Visit (INDEPENDENT_AMBULATORY_CARE_PROVIDER_SITE_OTHER): Payer: Medicare Other | Admitting: *Deleted

## 2022-12-16 DIAGNOSIS — L501 Idiopathic urticaria: Secondary | ICD-10-CM

## 2022-12-16 MED ORDER — OMALIZUMAB 150 MG/ML ~~LOC~~ SOSY
300.0000 mg | PREFILLED_SYRINGE | SUBCUTANEOUS | Status: DC
  Administered 2022-12-16 – 2023-03-10 (×4): 300 mg via SUBCUTANEOUS

## 2022-12-17 DIAGNOSIS — M6281 Muscle weakness (generalized): Secondary | ICD-10-CM | POA: Diagnosis not present

## 2022-12-17 DIAGNOSIS — M25511 Pain in right shoulder: Secondary | ICD-10-CM | POA: Diagnosis not present

## 2022-12-20 ENCOUNTER — Other Ambulatory Visit: Payer: Self-pay | Admitting: Family Medicine

## 2022-12-20 DIAGNOSIS — M6281 Muscle weakness (generalized): Secondary | ICD-10-CM | POA: Diagnosis not present

## 2022-12-20 DIAGNOSIS — M25511 Pain in right shoulder: Secondary | ICD-10-CM | POA: Diagnosis not present

## 2022-12-20 DIAGNOSIS — E039 Hypothyroidism, unspecified: Secondary | ICD-10-CM

## 2022-12-24 DIAGNOSIS — M25511 Pain in right shoulder: Secondary | ICD-10-CM | POA: Diagnosis not present

## 2022-12-24 DIAGNOSIS — M6281 Muscle weakness (generalized): Secondary | ICD-10-CM | POA: Diagnosis not present

## 2022-12-29 ENCOUNTER — Other Ambulatory Visit: Payer: Self-pay

## 2022-12-29 DIAGNOSIS — M6281 Muscle weakness (generalized): Secondary | ICD-10-CM | POA: Diagnosis not present

## 2022-12-29 DIAGNOSIS — M25511 Pain in right shoulder: Secondary | ICD-10-CM | POA: Diagnosis not present

## 2022-12-29 MED ORDER — CHLORTHALIDONE 25 MG PO TABS: 25.0000 mg | ORAL_TABLET | Freq: Every day | ORAL | 1 refills | Status: DC

## 2022-12-29 MED ORDER — CHLORTHALIDONE 25 MG PO TABS: 25.0000 mg | ORAL_TABLET | Freq: Every day | ORAL | 0 refills | Status: DC

## 2022-12-31 ENCOUNTER — Encounter: Payer: Self-pay | Admitting: Orthopedic Surgery

## 2022-12-31 ENCOUNTER — Ambulatory Visit: Payer: Medicare Other | Admitting: Physical Medicine and Rehabilitation

## 2022-12-31 ENCOUNTER — Ambulatory Visit (INDEPENDENT_AMBULATORY_CARE_PROVIDER_SITE_OTHER): Payer: Medicare Other | Admitting: Orthopedic Surgery

## 2022-12-31 DIAGNOSIS — Z9889 Other specified postprocedural states: Secondary | ICD-10-CM

## 2023-01-01 NOTE — Progress Notes (Signed)
Post-Op Visit Note   Patient: Laura [name]           Date of Birth: [birth year]           MRN: [id number] Visit Date: 12/31/2022 PCP: Rochel Brome, MD   Assessment & Plan:  Chief Complaint:  Chief Complaint  Patient presents with   Right Shoulder - Routine Post Op    10/08/22 (12w)Right Shoulder Arthroscopy, Subacromial Decompression, Mini Open Rotator Cuff Tear Repair, Arthroscopic Distal Clavicle Excision Biceps Tenodesis      Visit Diagnoses:  1. S/P right rotator cuff repair     Plan: [place] underwent right shoulder arthroscopy subacromial decompression and mini open rotator cuff tear repair and distal clavicle excision 10/08/2022.  She is about 3 months postop.  Doing physical therapy 2 times a week.  No medication for pain.  She has to do lifting climbing and her work at [employer].  Also has to stack ladders occasionally.  Her tear photos are reviewed.  Large tear present which looks very good clinically right now.  On examination her range of motion is 70/95/170.  Strength is still lacking slightly and is not quite there yet for return to unrestricted activity.  She needs to work on functional strengthening activity for the next 6 weeks.  Continue with physical therapy towards that goal.  Come back in 6 weeks for clinical recheck prior to return to work.  Follow-Up Instructions: No follow-ups on file.   Orders:  No orders of the defined types were placed in this encounter.  No orders of the defined types were placed in this encounter.   Imaging: No results found.  PMFS History: Patient Active Problem List   Diagnosis Date Noted   Irritable bowel syndrome (IBS) 11/20/2022   Degenerative superior labral anterior-to-posterior (SLAP) tear of right shoulder 10/09/2022   Biceps tendinitis, right 10/09/2022   Arthritis of right acromioclavicular joint 10/09/2022   Nontraumatic complete tear of right rotator cuff 10/09/2022   S/P right rotator cuff repair 10/08/2022    Primary osteoarthritis of right knee 07/06/2022   Myofascial pain dysfunction syndrome 05/24/2022   Moderate recurrent major depression ([place]) 04/19/2022   Unexplained weight gain 01/11/2022   Allergic rhinitis due to allergen 12/08/2021   Xerostomia 12/06/2021   Hypokalemia 11/28/2021   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   Other acute sinusitis 08/05/2021   BMI 27.0-27.9,adult 05/01/2021   ADD (attention deficit disorder) 05/01/2021   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/12/2020   Fatty liver 08/11/2017   OSA on CPAP 08/11/2017   Renal atrophy, right 07/18/2017   Barrett's esophagus 11/02/2016   Memory impairment 10/18/2016   Chronic pain syndrome 10/12/2016   Chronic low back pain (Location of Tertiary source of pain) (Bilateral) (L>R) 10/12/2016   Lumbar facet joint syndrome 10/12/2016   Chronic sacroiliac joint pain 10/12/2016   Chronic shoulder pain (Location of Primary Source of Pain) (Bilateral) (R>L) 10/12/2016   Chronic neck pain (Location of Secondary source of pain) (Bilateral) (R>L) 10/12/2016   Chronic upper back pain (Bilateral) (L>R) 10/12/2016   Chronic hand pain (Bilateral) (R>L) 10/12/2016   Chronic hand pain (Right) 10/12/2016   Chronic hand pain (Left) 10/12/2016   Chronic knee pain (Right) 10/12/2016   CKD (chronic kidney disease) 10/11/2016   Vitamin B12 deficiency 07/26/2016   GAD (generalized anxiety disorder) 07/06/2016   Depression 07/06/2016   Raynaud disease 05/06/2016   Fibromyalgia 02/13/2016   Prediabetes 12/23/2015   Heart palpitations 12/16/2015   Heart murmur 12/16/2015   Mixed hyperlipidemia  11/19/2015   Osteoarthrosis, generalized, multiple joints 06/06/2015   Vaginal enterocele    Urinary retention with incomplete bladder emptying    Obesity, Class I, BMI 30.0-34.9 (see actual BMI)    Rectocele    Cystocele    Hypothyroidism, acquired 12/03/2014   Essential hypertension 12/03/2014   Mild intermittent asthma 12/03/2014   Insomnia  08/31/2013   Past Medical History:  Diagnosis Date   Acute GI bleeding 09/01/2019   Anxiety and depression    Asthma    Bowel obstruction ([place])    Chest pain    a. 01/2016 Ex MV: Hypertensive response. Freq PVCs w/ exercise. nl EF. No ST/T changes. No ischemia.   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 03/2016 Echo: EF 60-65%, no rwma, mild MR, nl LA size, nl RV fxn.   Herpes zoster without complication [id number]   High cholesterol    History of hiatal hernia    Hypertension    Kidney stones    Myofascial pain syndrome    Nasal septal perforation 05/12/2018   Hx of cocaine use   Palpitations    a. 03/2016 Holter: Sinus rhythm, avg HR 83, max 123, min 64. 4 PACs. 10,356 isolated PVCs, one vent couplet, 3842 V bigeminy, 4 beats NSVT->prev on BB - dc 2/2 swelling.   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 Took metformin to try to lessen   Raynaud disease    Rectocele    Shingles    Sleep apnea    "mild" per pt   Status post hysterectomy 03/08/2017   Torn rotator cuff    left   Urinary retention with incomplete bladder emptying    Vaginal dryness, menopausal    Vaginal enterocele    Vitamin D deficiency 12/03/2014   Wears hearing aid in both ears     Family History  Problem Relation Age of Onset   Stroke Father    Diabetes Father    Breast cancer Mother 67   Diverticulitis Mother    Esophageal cancer Maternal Grandfather    Colon cancer Paternal Grandmother    Suicidality Brother    Ovarian cancer Neg Hx    Stomach cancer Neg Hx     Past Surgical History:  Procedure Laterality Date   8 HOUR [place] STUDY N/A 10/11/2018   Procedure: 24 HOUR [place] STUDY;  Surgeon: Mauri Pole, MD;  Location: [facility];  Service: Endoscopy;  Laterality: N/A;   ABDOMINAL HYSTERECTOMY     ANKLE  SURGERY     ran over by mother in car by [place] TENODESIS Right 10/08/2022   Procedure: BICEPS TENODESIS;  Surgeon: Meredith Pel, MD;  Location: [place];  Service: Orthopedics;  Laterality: Right;   BIOPSY  09/02/2019   Procedure: BIOPSY;  Surgeon: Rush Landmark Telford Nab., MD;  Location: [place];  Service: Gastroenterology;;   COLONOSCOPY     COLONOSCOPY WITH PROPOFOL N/A 09/02/2019   Procedure: COLONOSCOPY WITH PROPOFOL;  Surgeon: Irving Copas., MD;  Location: [place];  Service: Gastroenterology;  Laterality: N/A;   COLPORRHAPHY  2015   posterior and enterocele ligation   CYSTOSCOPY  04/11/2017   Procedure: CYSTOSCOPY;  Surgeon: Defrancesco, Alanda Slim, MD;  Location: [facility]  [facility];  Service: Gynecology;;   ESOPHAGEAL MANOMETRY N/A 10/11/2018   Procedure: ESOPHAGEAL MANOMETRY (EM);  Surgeon: Mauri Pole, MD;  Location: [facility];  Service: Endoscopy;  Laterality: N/A;   ESOPHAGOGASTRODUODENOSCOPY (EGD) WITH PROPOFOL N/A 09/02/2019   Procedure: ESOPHAGOGASTRODUODENOSCOPY (EGD) WITH PROPOFOL;  Surgeon: Rush Landmark Telford Nab., MD;  Location: [place];  Service: Gastroenterology;  Laterality: N/A;   EXTRACORPOREAL SHOCK WAVE LITHOTRIPSY Left 07/31/2020   Procedure: EXTRACORPOREAL SHOCK WAVE LITHOTRIPSY (ESWL);  Surgeon: Billey Co, MD;  Location: [facility];  Service: Urology;  Laterality: Left;   KNEE ARTHROSCOPY WITH MEDIAL MENISECTOMY Right 02/04/2020   Procedure: KNEE ARTHROSCOPY WITH PARTIAL LATERAL AND MEDIAL MENISECTOMY,  PARTIAL SYNOVECTOMY AND CHONDROPLASTY;  Surgeon: Lovell Sheehan, MD;  Location: [place];  Service: Orthopedics;  Laterality: Right;   LAPAROSCOPIC SALPINGO OOPHERECTOMY Left 04/11/2017   Procedure: LAPAROSCOPIC LEFT SALPINGO OOPHORECTOMY;  Surgeon: Brayton Mars, MD;  Location: [facility];  Service: Gynecology;  Laterality: Left;   LITHOTRIPSY     OOPHORECTOMY     PARTIAL HYSTERECTOMY      [place] IMPEDANCE STUDY N/A 10/11/2018   Procedure: [place] IMPEDANCE STUDY;  Surgeon: Mauri Pole, MD;  Location: [facility];  Service: Endoscopy;  Laterality: N/A;   PVC ABLATION N/A 01/18/2020   Procedure: PVC ABLATION;  Surgeon: Thompson Grayer, MD;  Location: [place] [facility];  Service: Cardiovascular;  Laterality: N/A;   SHOULDER ARTHROSCOPY WITH OPEN ROTATOR CUFF REPAIR AND DISTAL CLAVICLE ACROMINECTOMY Right 10/08/2022   Procedure: RIGHT SHOULDER ARTHROSCOPY, SUBACROMIAL DECOMPRESSION, MINI OPEN ROTATOR CUFF TEAR REPAIR, ARTHROSCOPIC DISTAL CLAVICLE EXCISION;  Surgeon: Meredith Pel, MD;  Location: [place];  Service: Orthopedics;  Laterality: Right;   thumb surgery     TONSILLECTOMY     removed as a child   UPPER GASTROINTESTINAL ENDOSCOPY     Social History   Occupational History   Occupation: Disabled  Tobacco Use   Smoking status: Former    Types: Cigarettes    Quit date: 04/08/1995    Years since quitting: 27.7   Smokeless tobacco: Never  Vaping Use   Vaping Use: Never used  Substance and Sexual Activity   Alcohol use: Not Currently    Comment: rare; Maybe one glass of wine once every 6 months   Drug use: No   Sexual activity: Not Currently    Partners: Male    Birth control/protection: Surgical

## 2023-01-02 ENCOUNTER — Encounter: Payer: Self-pay | Admitting: Orthopedic Surgery

## 2023-01-03 DIAGNOSIS — M25511 Pain in right shoulder: Secondary | ICD-10-CM | POA: Diagnosis not present

## 2023-01-03 DIAGNOSIS — M6281 Muscle weakness (generalized): Secondary | ICD-10-CM | POA: Diagnosis not present

## 2023-01-03 NOTE — Telephone Encounter (Signed)
Note printed for Laura [name].

## 2023-01-03 NOTE — Telephone Encounter (Signed)
Sure thx

## 2023-01-13 ENCOUNTER — Ambulatory Visit (INDEPENDENT_AMBULATORY_CARE_PROVIDER_SITE_OTHER): Payer: Medicare Other | Admitting: *Deleted

## 2023-01-13 DIAGNOSIS — L501 Idiopathic urticaria: Secondary | ICD-10-CM

## 2023-01-19 DIAGNOSIS — M6281 Muscle weakness (generalized): Secondary | ICD-10-CM | POA: Diagnosis not present

## 2023-01-19 DIAGNOSIS — M25511 Pain in right shoulder: Secondary | ICD-10-CM | POA: Diagnosis not present

## 2023-01-21 ENCOUNTER — Telehealth: Payer: Medicare Other | Admitting: Nurse Practitioner

## 2023-01-21 ENCOUNTER — Telehealth: Payer: Self-pay

## 2023-01-21 DIAGNOSIS — J329 Chronic sinusitis, unspecified: Secondary | ICD-10-CM

## 2023-01-21 DIAGNOSIS — B9789 Other viral agents as the cause of diseases classified elsewhere: Secondary | ICD-10-CM | POA: Diagnosis not present

## 2023-01-21 MED ORDER — IPRATROPIUM BROMIDE 0.03 % NA SOLN: 2.0000 | Freq: Two times a day (BID) | NASAL | 12 refills | Status: DC

## 2023-01-21 NOTE — Progress Notes (Signed)
E-Visit for Sinus Problems  We are sorry that you are not feeling well.  Here is how we plan to help!  Based on what you have shared with me it looks like you have sinusitis.  Sinusitis is inflammation and infection in the sinus cavities of the head.  Based on your presentation I believe you most likely have Acute Viral Sinusitis.This is an infection most likely caused by a virus. There is not specific treatment for viral sinusitis other than to help you with the symptoms until the infection runs its course.  You may use an oral decongestant such as Mucinex D or if you have glaucoma or high blood pressure use plain Mucinex. Saline nasal spray help and can safely be used as often as needed for congestion, I have prescribed: Ipratropium Bromide nasal spray 0.03% 2 sprays in eah nostril 2-3 times a day  We typically do not recommend antibiotics prior to 7-10 days of persistent symptoms  Early stages of upper respiratory symptoms are generally from allergies or a virus and do not respond to antibiotics    Some authorities believe that zinc sprays or the use of Echinacea may shorten the course of your symptoms.  Sinus infections are not as easily transmitted as other respiratory infection, however we still recommend that you avoid close contact with loved ones, especially the very young and elderly.  Remember to wash your hands thoroughly throughout the day as this is the number one way to prevent the spread of infection!  Home Care: Only take medications as instructed by your medical team. Do not take these medications with alcohol. A steam or ultrasonic humidifier can help congestion.  You can place a towel over your head and breathe in the steam from hot water coming from a faucet. Avoid close contacts especially the very young and the elderly. Cover your mouth when you cough or sneeze. Always remember to wash your hands.  Get Help Right Away If: You develop worsening fever or sinus pain. You  develop a severe head ache or visual changes. Your symptoms persist after you have completed your treatment plan.  Make sure you Understand these instructions. Will watch your condition. Will get help right away if you are not doing well or get worse.   Thank you for choosing an e-visit.  Your e-visit answers were reviewed by a board certified advanced clinical practitioner to complete your personal care plan. Depending upon the condition, your plan could have included both over the counter or prescription medications.  Please review your pharmacy choice. Make sure the pharmacy is open so you can pick up prescription now. If there is a problem, you may contact your provider through [employer] and have the prescription routed to another pharmacy.  Your safety is important to [place]. If you have drug allergies check your prescription carefully.   For the next 24 hours you can use MyChart to ask questions about today's visit, request a non-urgent call back, or ask for a work or school excuse. You will get an email in the next two days asking about your experience. I hope that your e-visit has been valuable and will speed your recovery.  Meds ordered this encounter  Medications   ipratropium (ATROVENT) 0.03 % nasal spray    Sig: Place 2 sprays into both nostrils every 12 (twelve) hours.    Dispense:  30 mL    Refill:  12    I spent approximately 5 minutes reviewing the patient's history, current symptoms  and coordinating their care today.

## 2023-01-21 NOTE — Telephone Encounter (Signed)
Patient called this morning to request an appointment for a sick visit. Patient notified that we do not have an opening today. It was recommended for the patient to go to UC or to do an e-visit with a [facility] provider.

## 2023-01-24 DIAGNOSIS — M25511 Pain in right shoulder: Secondary | ICD-10-CM | POA: Diagnosis not present

## 2023-01-24 DIAGNOSIS — M6281 Muscle weakness (generalized): Secondary | ICD-10-CM | POA: Diagnosis not present

## 2023-01-26 DIAGNOSIS — M6281 Muscle weakness (generalized): Secondary | ICD-10-CM | POA: Diagnosis not present

## 2023-01-26 DIAGNOSIS — M25511 Pain in right shoulder: Secondary | ICD-10-CM | POA: Diagnosis not present

## 2023-02-07 DIAGNOSIS — M6281 Muscle weakness (generalized): Secondary | ICD-10-CM | POA: Diagnosis not present

## 2023-02-07 DIAGNOSIS — M25511 Pain in right shoulder: Secondary | ICD-10-CM | POA: Diagnosis not present

## 2023-02-09 ENCOUNTER — Other Ambulatory Visit: Payer: Self-pay | Admitting: Family Medicine

## 2023-02-09 DIAGNOSIS — M25511 Pain in right shoulder: Secondary | ICD-10-CM | POA: Diagnosis not present

## 2023-02-09 DIAGNOSIS — M6281 Muscle weakness (generalized): Secondary | ICD-10-CM | POA: Diagnosis not present

## 2023-02-09 DIAGNOSIS — I10 Essential (primary) hypertension: Secondary | ICD-10-CM

## 2023-02-10 ENCOUNTER — Ambulatory Visit (INDEPENDENT_AMBULATORY_CARE_PROVIDER_SITE_OTHER): Payer: Medicare Other | Admitting: *Deleted

## 2023-02-10 DIAGNOSIS — L501 Idiopathic urticaria: Secondary | ICD-10-CM | POA: Diagnosis not present

## 2023-02-11 ENCOUNTER — Encounter
Payer: Medicare Other | Attending: Physical Medicine and Rehabilitation | Admitting: Physical Medicine and Rehabilitation

## 2023-02-11 ENCOUNTER — Encounter: Payer: Self-pay | Admitting: Physical Medicine and Rehabilitation

## 2023-02-11 ENCOUNTER — Ambulatory Visit: Payer: Medicare Other | Admitting: Surgical

## 2023-02-11 ENCOUNTER — Ambulatory Visit (INDEPENDENT_AMBULATORY_CARE_PROVIDER_SITE_OTHER): Payer: Medicare Other

## 2023-02-11 VITALS — BP 168/101 | HR 86 | Ht 65.0 in | Wt 196.0 lb

## 2023-02-11 DIAGNOSIS — M25561 Pain in right knee: Secondary | ICD-10-CM | POA: Diagnosis not present

## 2023-02-11 DIAGNOSIS — M7918 Myalgia, other site: Secondary | ICD-10-CM

## 2023-02-11 DIAGNOSIS — G8929 Other chronic pain: Secondary | ICD-10-CM

## 2023-02-11 DIAGNOSIS — Z9889 Other specified postprocedural states: Secondary | ICD-10-CM | POA: Diagnosis not present

## 2023-02-11 DIAGNOSIS — M1711 Unilateral primary osteoarthritis, right knee: Secondary | ICD-10-CM

## 2023-02-11 DIAGNOSIS — M797 Fibromyalgia: Secondary | ICD-10-CM | POA: Diagnosis not present

## 2023-02-11 MED ORDER — HYDROMORPHONE HCL 2 MG PO TABS: 2.0000 mg | ORAL_TABLET | Freq: Four times a day (QID) | ORAL | 0 refills | Status: DC | PRN

## 2023-02-11 MED ORDER — TIZANIDINE HCL 4 MG PO TABS: 8.0000 mg | ORAL_TABLET | Freq: Two times a day (BID) | ORAL | 5 refills | Status: DC

## 2023-02-11 MED ORDER — TRAZODONE HCL 50 MG PO TABS: 50.0000 mg | ORAL_TABLET | Freq: Every day | ORAL | 5 refills | Status: DC

## 2023-02-11 MED ORDER — LIDOCAINE HCL 1 % IJ SOLN: 3.0000 mL | Freq: Once | INTRAMUSCULAR | Status: DC

## 2023-02-11 NOTE — Patient Instructions (Addendum)
Plan: Suggest taking Vit D- 2000-4000 units/day-  for fibromyalgia related fatigue. Take 2000 units daily x 4-6 weeks and if fatigue isn't better "enough"- can increase ot 4000 units/day.   2. Patient here for trigger point injections for  Consent done and on chart.  Cleaned areas with alcohol and injected using a 27 gauge 1.5 inch needle  Injected  6cc- none wasted Using 1% Lidocaine with no EPI  Upper traps B/L  Levators- B/L  Posterior scalenes Middle scalenes- B/L  Splenius Capitus Pectoralis Major Rhomboids- B/L x3 Infraspinatus Teres Major/minor Thoracic paraspinals- B/L x3 Lumbar paraspinals- B/L x2 Other injections-    Patient's level of pain prior was 8/10 Current level of pain after injections is down to 6 /10-   There was no bleeding or complications.  Patient was advised to drink a lot of water on day after injections to flush system Will have increased soreness for 12-48 hours after injections.  Can use Lidocaine patches the day AFTER injections Can use theracane on day of injections in places didn't inject Can use heating pad 4-6 hours AFTER injections   3. Cannot prescribe anything for grief/mood- on Ambien for sleep- will add Trazodone for sleep 50-100 mg nightly- #60- 5 refills   4. Will refill Dilaudid  1-2 mg as needed up to 2x/day for pain- joint pain.  #60- no refills- will take back over from shoulder surgeon  5. Helping pt with grief. Call psychiatrist for appointment - ask about TMS  6. F/U in 3 months
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
## 2023-02-13 ENCOUNTER — Encounter: Payer: Self-pay | Admitting: Surgical
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
## 2023-02-14 ENCOUNTER — Telehealth: Payer: Self-pay

## 2023-02-14 ENCOUNTER — Telehealth: Payer: Self-pay | Admitting: *Deleted

## 2023-02-14 DIAGNOSIS — M6281 Muscle weakness (generalized): Secondary | ICD-10-CM | POA: Diagnosis not present

## 2023-02-14 DIAGNOSIS — M25511 Pain in right shoulder: Secondary | ICD-10-CM | POA: Diagnosis not present

## 2023-02-14 NOTE — Telephone Encounter (Signed)
-----   Message from Redge Gainer, [place] sent at 02/10/2023  2:54 PM EDT ----- Regarding: AT HOME ADMIN Please call patient in regards to at home administration for her Xolair.

## 2023-02-14 NOTE — Telephone Encounter (Signed)
Pt called today to request a same day appointment for the following symptoms:sinus infection. Unfortunately our schedule is full and we have no openings for today or tomorrow. Pt was notified to go to Urgent Care.

## 2023-02-14 NOTE — Telephone Encounter (Signed)
Called patient and given number to [employer] to call to reorder same with instrux on storage and dosing

## 2023-02-16 ENCOUNTER — Telehealth: Payer: Self-pay | Admitting: Orthopedic Surgery

## 2023-02-16 NOTE — Telephone Encounter (Signed)
[employer] forms received. To [employer]. 

## 2023-02-17 DIAGNOSIS — M25511 Pain in right shoulder: Secondary | ICD-10-CM | POA: Diagnosis not present

## 2023-02-17 DIAGNOSIS — M6281 Muscle weakness (generalized): Secondary | ICD-10-CM | POA: Diagnosis not present

## 2023-02-18 ENCOUNTER — Ambulatory Visit: Payer: Medicare Other | Admitting: Physical Medicine and Rehabilitation

## 2023-02-21 DIAGNOSIS — M25511 Pain in right shoulder: Secondary | ICD-10-CM | POA: Diagnosis not present

## 2023-02-21 DIAGNOSIS — M6281 Muscle weakness (generalized): Secondary | ICD-10-CM | POA: Diagnosis not present

## 2023-02-24 DIAGNOSIS — M6281 Muscle weakness (generalized): Secondary | ICD-10-CM | POA: Diagnosis not present

## 2023-02-24 DIAGNOSIS — M25511 Pain in right shoulder: Secondary | ICD-10-CM | POA: Diagnosis not present
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
## 2023-02-25 ENCOUNTER — Ambulatory Visit (INDEPENDENT_AMBULATORY_CARE_PROVIDER_SITE_OTHER): Payer: Medicare Other | Admitting: Family Medicine

## 2023-02-25 VITALS — BP 106/74 | HR 72 | Temp 97.3°F | Resp 16 | Ht 65.0 in | Wt 196.0 lb

## 2023-02-25 DIAGNOSIS — R1901 Right upper quadrant abdominal swelling, mass and lump: Secondary | ICD-10-CM | POA: Diagnosis not present

## 2023-02-25 DIAGNOSIS — R110 Nausea: Secondary | ICD-10-CM

## 2023-02-25 DIAGNOSIS — E039 Hypothyroidism, unspecified: Secondary | ICD-10-CM | POA: Diagnosis not present

## 2023-02-25 DIAGNOSIS — E782 Mixed hyperlipidemia: Secondary | ICD-10-CM

## 2023-02-25 DIAGNOSIS — G4733 Obstructive sleep apnea (adult) (pediatric): Secondary | ICD-10-CM

## 2023-02-25 DIAGNOSIS — F4320 Adjustment disorder, unspecified: Secondary | ICD-10-CM

## 2023-02-25 DIAGNOSIS — E538 Deficiency of other specified B group vitamins: Secondary | ICD-10-CM | POA: Diagnosis not present

## 2023-02-25 DIAGNOSIS — I10 Essential (primary) hypertension: Secondary | ICD-10-CM

## 2023-02-25 DIAGNOSIS — R7303 Prediabetes: Secondary | ICD-10-CM | POA: Diagnosis not present

## 2023-02-25 DIAGNOSIS — F4321 Adjustment disorder with depressed mood: Secondary | ICD-10-CM

## 2023-02-25 DIAGNOSIS — M797 Fibromyalgia: Secondary | ICD-10-CM | POA: Diagnosis not present

## 2023-02-25 DIAGNOSIS — K2270 Barrett's esophagus without dysplasia: Secondary | ICD-10-CM

## 2023-02-25 LAB — CBC WITH DIFFERENTIAL/PLATELET
Basophils Absolute: 0 10*3/uL (ref 0.0–0.2)
Basos: 0 %
EOS (ABSOLUTE): 0.2 10*3/uL (ref 0.0–0.4)
Eos: 4 %
Hematocrit: 43.3 % (ref 34.0–46.6)
Hemoglobin: 14.5 g/dL (ref 11.1–15.9)
Lymphocytes Absolute: 1.7 10*3/uL (ref 0.7–3.1)
Lymphs: 38 %
MCH: 28.2 pg (ref 26.6–33.0)
MCHC: 33.5 g/dL (ref 31.5–35.7)
MCV: 84 fL (ref 79–97)
Monocytes Absolute: 0.5 10*3/uL (ref 0.1–0.9)
Monocytes: 11 %
Neutrophils Absolute: 2.1 10*3/uL (ref 1.4–7.0)
Neutrophils: 47 %
Platelets: 386 10*3/uL (ref 150–450)
RBC: 5.14 x10E6/uL (ref 3.77–5.28)
RDW: 15.4 % (ref 11.7–15.4)
WBC: 4.6 10*3/uL (ref 3.4–10.8)

## 2023-02-25 LAB — COMPREHENSIVE METABOLIC PANEL
ALT: 34 IU/L — ABNORMAL HIGH (ref 0–32)
AST: 26 IU/L (ref 0–40)
Albumin/Globulin Ratio: 1.7 (ref 1.2–2.2)
Albumin: 4.7 g/dL (ref 3.8–4.9)
Alkaline Phosphatase: 76 IU/L (ref 44–121)
BUN/Creatinine Ratio: 31 — ABNORMAL HIGH (ref 9–23)
BUN: 23 mg/dL (ref 6–24)
Bilirubin Total: 0.3 mg/dL (ref 0.0–1.2)
CO2: 31 mmol/L — ABNORMAL HIGH (ref 20–29)
Calcium: 10.4 mg/dL — ABNORMAL HIGH (ref 8.7–10.2)
Chloride: 94 mmol/L — ABNORMAL LOW (ref 96–106)
Creatinine, Ser: 0.75 mg/dL (ref 0.57–1.00)
Globulin, Total: 2.8 g/dL (ref 1.5–4.5)
Glucose: 114 mg/dL — ABNORMAL HIGH (ref 70–99)
Potassium: 3.6 mmol/L (ref 3.5–5.2)
Sodium: 138 mmol/L (ref 134–144)
Total Protein: 7.5 g/dL (ref 6.0–8.5)
eGFR: 93 mL/min/{1.73_m2} (ref >59)

## 2023-02-25 LAB — POCT URINALYSIS DIP (CLINITEK)
Bilirubin, UA: NEGATIVE
Blood, UA: NEGATIVE
Glucose, UA: NEGATIVE mg/dL
Ketones, POC UA: NEGATIVE mg/dL
Leukocytes, UA: NEGATIVE
Nitrite, UA: NEGATIVE
POC PROTEIN,UA: NEGATIVE
Spec Grav, UA: 1.015 (ref 1.010–1.025)
Urobilinogen, UA: 0.2 E.U./dL
pH, UA: 6 (ref 5.0–8.0)

## 2023-02-25 LAB — AMYLASE: Amylase: 63 U/L (ref 31–110)

## 2023-02-25 LAB — LIPASE: Lipase: 28 U/L (ref 14–72)

## 2023-02-25 MED ORDER — METHYLPREDNISOLONE 4 MG PO TBPK
ORAL_TABLET | ORAL | 0 refills | Status: DC
Start: 2023-02-25 — End: 2023-04-21

## 2023-02-25 NOTE — Patient Instructions (Signed)
[facility]: [place] at [phone]

## 2023-02-26 LAB — LIPID PANEL
Chol/HDL Ratio: 3.7 ratio (ref 0.0–4.4)
Cholesterol, Total: 245 mg/dL — ABNORMAL HIGH (ref 100–199)
HDL: 66 mg/dL (ref >39)
LDL Chol Calc (NIH): 142 mg/dL — ABNORMAL HIGH (ref 0–99)
Triglycerides: 206 mg/dL — ABNORMAL HIGH (ref 0–149)
VLDL Cholesterol Cal: 37 mg/dL (ref 5–40)

## 2023-02-26 LAB — HEMOGLOBIN A1C
Est. average glucose Bld gHb Est-mCnc: 134 mg/dL
Hgb A1c MFr Bld: 6.3 % — ABNORMAL HIGH (ref 4.8–5.6)

## 2023-02-26 LAB — VITAMIN B12: Vitamin B-12: 683 pg/mL (ref 232–1245)

## 2023-02-27 ENCOUNTER — Other Ambulatory Visit: Payer: Self-pay | Admitting: Family Medicine

## 2023-02-28 ENCOUNTER — Encounter: Payer: Self-pay | Admitting: Family Medicine

## 2023-03-01 DIAGNOSIS — M25511 Pain in right shoulder: Secondary | ICD-10-CM | POA: Diagnosis not present

## 2023-03-01 DIAGNOSIS — M6281 Muscle weakness (generalized): Secondary | ICD-10-CM | POA: Diagnosis not present

## 2023-03-03 ENCOUNTER — Other Ambulatory Visit: Payer: Self-pay | Admitting: Family Medicine

## 2023-03-03 ENCOUNTER — Encounter: Payer: Self-pay | Admitting: Family Medicine

## 2023-03-03 DIAGNOSIS — I10 Essential (primary) hypertension: Secondary | ICD-10-CM

## 2023-03-03 DIAGNOSIS — M25511 Pain in right shoulder: Secondary | ICD-10-CM | POA: Diagnosis not present

## 2023-03-03 DIAGNOSIS — M6281 Muscle weakness (generalized): Secondary | ICD-10-CM | POA: Diagnosis not present

## 2023-03-03 MED ORDER — GABAPENTIN 100 MG PO CAPS: 100.0000 mg | ORAL_CAPSULE | Freq: Two times a day (BID) | ORAL | 0 refills | Status: DC

## 2023-03-08 DIAGNOSIS — M25511 Pain in right shoulder: Secondary | ICD-10-CM | POA: Diagnosis not present

## 2023-03-08 DIAGNOSIS — M6281 Muscle weakness (generalized): Secondary | ICD-10-CM | POA: Diagnosis not present

## 2023-03-10 ENCOUNTER — Ambulatory Visit (INDEPENDENT_AMBULATORY_CARE_PROVIDER_SITE_OTHER): Payer: Medicare Other | Admitting: *Deleted

## 2023-03-10 DIAGNOSIS — L501 Idiopathic urticaria: Secondary | ICD-10-CM

## 2023-03-10 DIAGNOSIS — M25511 Pain in right shoulder: Secondary | ICD-10-CM | POA: Diagnosis not present

## 2023-03-10 DIAGNOSIS — M6281 Muscle weakness (generalized): Secondary | ICD-10-CM | POA: Diagnosis not present

## 2023-03-12 ENCOUNTER — Encounter: Payer: Self-pay | Admitting: Family Medicine

## 2023-03-12 DIAGNOSIS — R110 Nausea: Secondary | ICD-10-CM | POA: Insufficient documentation

## 2023-03-12 DIAGNOSIS — F4321 Adjustment disorder with depressed mood: Secondary | ICD-10-CM | POA: Insufficient documentation

## 2023-03-12 DIAGNOSIS — R1901 Right upper quadrant abdominal swelling, mass and lump: Secondary | ICD-10-CM | POA: Insufficient documentation

## 2023-03-12 NOTE — Assessment & Plan Note (Signed)
Check labs to rule out pancreatitis.

## 2023-03-12 NOTE — Assessment & Plan Note (Signed)
Not at goal.  Continue current medications. Tizanidine and gabapentin. Medrol dose pack sent.

## 2023-03-12 NOTE — Assessment & Plan Note (Signed)
Recommend call [facility] for grief counseling:   at [phone]

## 2023-03-12 NOTE — Assessment & Plan Note (Signed)
Check b12 level  

## 2023-03-12 NOTE — Assessment & Plan Note (Signed)
Check labs.  Check gb US.

## 2023-03-12 NOTE — Assessment & Plan Note (Signed)
Continue cpap.  

## 2023-03-12 NOTE — Assessment & Plan Note (Signed)
The current medical regimen is effective;  continue present plan and medications. Synthroid 112 mcg once daily

## 2023-03-12 NOTE — Assessment & Plan Note (Signed)
The current medical regimen is effective;  continue present plan and medications. Continue Captopril 25 mg TID, Chlorthalidone 25 mg daily, furosemide 20 mg 2 times a day. Recommend continue to work on eating healthy diet and exercise. Check labs 

## 2023-03-12 NOTE — Assessment & Plan Note (Signed)
Recommend continue to work on eating healthy diet and exercise.  

## 2023-03-12 NOTE — Assessment & Plan Note (Signed)
Not at goal. Continue repatha 140 mg IM every 2 weeks.  Continue to work on eating a healthy diet and exercise.  Labs drawn today.

## 2023-03-17 DIAGNOSIS — M25511 Pain in right shoulder: Secondary | ICD-10-CM | POA: Diagnosis not present

## 2023-03-17 DIAGNOSIS — M6281 Muscle weakness (generalized): Secondary | ICD-10-CM | POA: Diagnosis not present

## 2023-03-18 ENCOUNTER — Telehealth: Payer: Self-pay

## 2023-03-18 NOTE — Progress Notes (Signed)
Care Management & Coordination Services Pharmacy Team  Reason for Encounter: General adherence update   Contacted patient for general health update and medication adherence call.  Spoke with patient on 03/18/2023    What concerns do you have about your medications? Patient stated she was swelling bad and was told to stop repatha and restart it. Swelling isn't as bad.  The patient reports the following side effects with their medications. Patient isn't sure if Repatha is causing swelling but Dr. Sedalia Muta is managing this issue.  How often do you forget or accidentally miss a dose? Never  Do you use a pillbox? Yes  Are you having any problems getting your medications from your pharmacy? No  Has the cost of your medications been a concern? No  Since last visit with PharmD, no interventions have been made.   The patient has not had an ED visit since last contact.   The patient reports the following problems with their health. Patient stated she is seeing cardiologist soon for palpitations and shortness of breath. Patient also has a scan scheduled for stomach pain.  Patient denies concerns or questions for Artelia Laroche, PharmD at this time.   Counseled patient on: Great job taking medications   Chart Updates: Recent office visits:  02-25-2023 Blane Ohara, MD. Follow up visit for hypertension. US abdomen limited RUQ ordered. Start medrol pak follow instructions.  11-19-2022 Blane Ohara, MD. Follow up visit for HLD and HTN. Start Cipro 500 mg twice daily. Completed tylenol, colace and trazodone  Recent consult visits:  02-11-2023 Genice Rouge, MD (Rehab). Start trazodone 50 mg  Take 1-2 tablets (50-100 mg total) by mouth at bedtime. Can take max dose with Ambien- no more than 100 mg QHS and hydromorphone 2 mg every 6 hours PRN.  02-11-2023 Magnant, Joycie Peek, PA-C (Orthopedic surgery). Follow up on right shoulder and knee pain. XR or right knee completed.   02-10-2023 Redge Gainer, CMA (Asthma center). Immunotherapy completed.  01-21-2023 Viviano Simas, FNP (Family med). Visit for sinus problems. Start Ipratropium Bromide nasal spray 0.03% 2 sprays in eah nostril 2-3 times a day.  01-13-2023 Redge Gainer, CMA (Asthma center). Immunotherapy completed.  12-31-2022 Cammy Copa, MD (Orthopedic surgery). Post op visit for rotator cuff repair.  12-16-2022 Redge Gainer, CMA (Asthma center). Immunotherapy completed.  12-13-2022 Mosetta Pigeon, MD (Nephrology). Follow up visit for hypertension. No changes.  12-03-2022 Richardean Sale, DO (Sports medicine). Initial visit for . Myofascial pain dysfunction syndrome, Fibromyalgia, Chronic pain syndrome, back pain and neck pain.  12-03-2022 Cammy Copa, MD (Sports medicine).Post op visit for rotator cuff repair.  11-12-2022 Genice Rouge, MD (Rehab). Referral placed to sports medicine. Start trazodone 100-300 mg at bedtime.   10-29-2022 Magnant, Joycie Peek, PA-C (Orthopedic surgery). Post op visit for rotator cuff repair.  10-15-2022 Magnant, Joycie Peek, PA-C (Orthopedic surgery). Post op visit for rotator cuff repair.  10-08-2022 Cammy Copa, MD (Sports medicine). Right rotator cuff repair procedure completed. Start tylenol 325-650 mg every 6 hours PRN ans colace 100 mg twice daily.  Hospital visits:  None in previous 6 months  Medications: Outpatient Encounter Medications as of 03/18/2023  Medication Sig   albuterol (VENTOLIN HFA) 108 (90 Base) MCG/ACT inhaler Inhale 2 puffs into the lungs every 4 (four) hours as needed for wheezing or shortness of breath.   ALPRAZolam (XANAX) 1 MG tablet Take 1 mg by mouth 4 (four) times daily as needed for anxiety.   Blood Glucose Monitoring Suppl (  ACCU-CHEK GUIDE) w/Device KIT 1 each by Does not apply route daily.   captopril (CAPOTEN) 25 MG tablet Take 1 tablet (25 mg total) by mouth 3 (three) times daily.   chlorthalidone (HYGROTON) 25  MG tablet Take 1 tablet (25 mg total) by mouth daily.   clotrimazole-betamethasone (LOTRISONE) cream Apply 1 Application topically 2 (two) times daily.   EPINEPHrine 0.3 mg/0.3 mL IJ SOAJ injection Inject 0.3 mLs (0.3 mg total) into the muscle as needed for anaphylaxis.   furosemide (LASIX) 20 MG tablet TAKE 1 TABLET BY MOUTH TWICE DAILY.   gabapentin (NEURONTIN) 100 MG capsule Take 1 capsule (100 mg total) by mouth 2 (two) times daily.   glucose blood (ACCU-CHEK GUIDE) test strip 1 each by Other route daily in the afternoon. Use as instructed   HYDROmorphone (DILAUDID) 2 MG tablet Take 1 tablet (2 mg total) by mouth every 6 (six) hours as needed for moderate pain (pains score 4-6).   hydrOXYzine (VISTARIL) 100 MG capsule Take 1 capsule (100 mg total) by mouth 3 (three) times daily as needed for itching.   ipratropium (ATROVENT) 0.03 % nasal spray Place 2 sprays into both nostrils every 12 (twelve) hours.   ipratropium-albuterol (DUONEB) 0.5-2.5 (3) MG/3ML SOLN Take 3 mLs by nebulization every 4 (four) hours as needed.   levothyroxine (SYNTHROID) 112 MCG tablet TAKE ONE TABLET BY MOUTH EVERY DAY   methylPREDNISolone (MEDROL DOSEPAK) 4 MG TBPK tablet 6 pills day 1, 5 pills day 2, 4 pills day 3, 3 pills day 4, 2 pills day 5, 1 pill day 6.   niacin (VITAMIN B3) 100 MG tablet Take 200 mg by mouth at bedtime.   nystatin powder Apply 1 Application topically 2 (two) times daily.   potassium chloride SA (KLOR-CON M) 20 MEQ tablet TAKE 2 TABLETS BY MOUTH THREE TIMES A DAY.   REPATHA 140 MG/ML SOSY INJECT 140MG  SUBCUTANEOUSLY  EVERY 2 WEEKS   SUMAtriptan (IMITREX) 25 MG tablet Take 1 tablet (25 mg total) by mouth every 2 (two) hours as needed for migraine. May repeat in 2 hours if headache persists or recurs.   tiZANidine (ZANAFLEX) 4 MG tablet Take 2 tablets (8 mg total) by mouth 2 (two) times daily. For muscle spasms   traZODone (DESYREL) 50 MG tablet Take 1-2 tablets (50-100 mg total) by mouth at bedtime.  Can take max dose with Ambien- no more than 100 mg QHS   zolpidem (AMBIEN) 10 MG tablet Take 10 mg by mouth at bedtime.   Facility-Administered Encounter Medications as of 03/18/2023  Medication   lidocaine (XYLOCAINE) 1 % (with pres) injection 3 mL   omalizumab Geoffry Paradise) prefilled syringe 300 mg    Recent vitals BP Readings from Last 3 Encounters:  02/25/23 106/74  02/11/23 (!) 168/101  12/03/22 130/78   Pulse Readings from Last 3 Encounters:  02/25/23 72  02/11/23 86  12/03/22 98   Wt Readings from Last 3 Encounters:  02/25/23 196 lb (88.9 kg)  02/11/23 196 lb (88.9 kg)  12/03/22 191 lb (86.6 kg)   BMI Readings from Last 3 Encounters:  02/25/23 32.62 kg/m  02/11/23 32.62 kg/m  12/03/22 31.78 kg/m    Recent lab results    Component Value Date/Time   NA 138 02/25/2023 0908   NA 141 06/13/2014 0152   K 3.6 02/25/2023 0908   K 3.8 06/13/2014 0152   CL 94 (L) 02/25/2023 0908   CL 102 06/13/2014 0152   CO2 31 (H) 02/25/2023 0908   CO2  32 06/13/2014 0152   GLUCOSE 114 (H) 02/25/2023 0908   GLUCOSE 122 (H) 10/11/2022 0418   GLUCOSE 103 (H) 06/13/2014 0152   BUN 23 02/25/2023 0908   BUN 14 06/13/2014 0152   CREATININE 0.75 02/25/2023 0908   CREATININE 0.88 06/20/2019 0908   CALCIUM 10.4 (H) 02/25/2023 0908   CALCIUM 7.9 (L) 06/13/2014 0152    Lab Results  Component Value Date   CREATININE 0.75 02/25/2023   EGFR 93 02/25/2023   GFRNONAA >60 10/11/2022   GFRAA 86 05/19/2020   Lab Results  Component Value Date/Time   HGBA1C 6.3 (H) 02/25/2023 09:08 AM   HGBA1C 6.2 (H) 11/19/2022 08:49 AM    Lab Results  Component Value Date   CHOL 245 (H) 02/25/2023   HDL 66 02/25/2023   LDLCALC 142 (H) 02/25/2023   LDLDIRECT 222 (H) 04/26/2019   TRIG 206 (H) 02/25/2023   CHOLHDL 3.7 02/25/2023    Care Gaps: Annual wellness visit in last year? No Shingrix overdue Covid booster overdue Mammogram overdue  Star Rating Drugs:  None  Huey Romans [facility] Clinical  Pharmacist Assistant [phone]

## 2023-03-22 DIAGNOSIS — M6281 Muscle weakness (generalized): Secondary | ICD-10-CM | POA: Diagnosis not present

## 2023-03-22 DIAGNOSIS — M25511 Pain in right shoulder: Secondary | ICD-10-CM | POA: Diagnosis not present

## 2023-03-23 ENCOUNTER — Encounter: Payer: Self-pay | Admitting: Orthopedic Surgery

## 2023-03-23 ENCOUNTER — Ambulatory Visit: Payer: Medicare Other | Admitting: Orthopedic Surgery

## 2023-03-23 DIAGNOSIS — G8929 Other chronic pain: Secondary | ICD-10-CM

## 2023-03-23 DIAGNOSIS — M25561 Pain in right knee: Secondary | ICD-10-CM | POA: Diagnosis not present

## 2023-03-23 DIAGNOSIS — Z9889 Other specified postprocedural states: Secondary | ICD-10-CM | POA: Diagnosis not present
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
## 2023-03-24 ENCOUNTER — Other Ambulatory Visit: Payer: Self-pay

## 2023-03-24 ENCOUNTER — Ambulatory Visit
Admission: RE | Admit: 2023-03-24 | Discharge: 2023-03-24 | Disposition: A | Discharge status: Home / Self Care | Payer: Medicare Other | Source: Ambulatory Visit | Attending: Family Medicine | Admitting: Family Medicine

## 2023-03-24 ENCOUNTER — Encounter: Payer: Self-pay | Admitting: Family Medicine

## 2023-03-24 DIAGNOSIS — R1011 Right upper quadrant pain: Secondary | ICD-10-CM | POA: Diagnosis not present

## 2023-03-24 DIAGNOSIS — M6281 Muscle weakness (generalized): Secondary | ICD-10-CM | POA: Diagnosis not present

## 2023-03-24 DIAGNOSIS — R1901 Right upper quadrant abdominal swelling, mass and lump: Secondary | ICD-10-CM

## 2023-03-24 DIAGNOSIS — M25511 Pain in right shoulder: Secondary | ICD-10-CM | POA: Diagnosis not present

## 2023-03-29 ENCOUNTER — Other Ambulatory Visit: Payer: Self-pay | Admitting: Family Medicine

## 2023-03-29 DIAGNOSIS — M6281 Muscle weakness (generalized): Secondary | ICD-10-CM | POA: Diagnosis not present

## 2023-03-29 DIAGNOSIS — E039 Hypothyroidism, unspecified: Secondary | ICD-10-CM

## 2023-03-29 DIAGNOSIS — M25511 Pain in right shoulder: Secondary | ICD-10-CM | POA: Diagnosis not present

## 2023-03-31 ENCOUNTER — Encounter (HOSPITAL_COMMUNITY)
Admission: RE | Admit: 2023-03-31 | Discharge: 2023-03-31 | Disposition: A | Discharge status: Home / Self Care | Payer: Medicare Other | Source: Ambulatory Visit | Attending: Family Medicine | Admitting: Family Medicine

## 2023-03-31 DIAGNOSIS — M6281 Muscle weakness (generalized): Secondary | ICD-10-CM | POA: Diagnosis not present

## 2023-03-31 DIAGNOSIS — R1011 Right upper quadrant pain: Secondary | ICD-10-CM | POA: Diagnosis not present

## 2023-03-31 DIAGNOSIS — M25511 Pain in right shoulder: Secondary | ICD-10-CM | POA: Diagnosis not present

## 2023-03-31 MED ORDER — TECHNETIUM TC 99M MEBROFENIN IV KIT
5.5000 | PACK | Freq: Once | INTRAVENOUS | Status: AC | PRN
  Administered 2023-03-31: 5.5 via INTRAVENOUS

## 2023-04-01 ENCOUNTER — Encounter: Payer: Self-pay | Admitting: Family Medicine

## 2023-04-03 ENCOUNTER — Other Ambulatory Visit: Payer: Self-pay | Admitting: Family Medicine

## 2023-04-03 DIAGNOSIS — R1011 Right upper quadrant pain: Secondary | ICD-10-CM

## 2023-04-05 ENCOUNTER — Encounter: Payer: Self-pay | Admitting: Cardiovascular Disease

## 2023-04-05 ENCOUNTER — Ambulatory Visit: Payer: Medicare Other | Attending: Cardiovascular Disease | Admitting: Cardiovascular Disease

## 2023-04-05 VITALS — BP 162/97 | HR 69 | Ht 65.0 in | Wt 201.0 lb

## 2023-04-05 DIAGNOSIS — E785 Hyperlipidemia, unspecified: Secondary | ICD-10-CM | POA: Diagnosis not present

## 2023-04-05 DIAGNOSIS — I493 Ventricular premature depolarization: Secondary | ICD-10-CM

## 2023-04-05 DIAGNOSIS — M6281 Muscle weakness (generalized): Secondary | ICD-10-CM | POA: Diagnosis not present

## 2023-04-05 DIAGNOSIS — I1A0 Resistant hypertension: Secondary | ICD-10-CM | POA: Diagnosis not present

## 2023-04-05 DIAGNOSIS — M25511 Pain in right shoulder: Secondary | ICD-10-CM | POA: Diagnosis not present

## 2023-04-05 DIAGNOSIS — I10 Essential (primary) hypertension: Secondary | ICD-10-CM

## 2023-04-05 MED ORDER — CARVEDILOL 6.25 MG PO TABS: 6.2500 mg | ORAL_TABLET | Freq: Two times a day (BID) | ORAL | 0 refills | Status: DC

## 2023-04-05 NOTE — Patient Instructions (Signed)
Medication Instructions:  START Carvedilol 6.25 mg twice daily  *If you need a refill on your cardiac medications before your next appointment, please call your pharmacy*   Lab Work: None ordered If you have labs (blood work) drawn today and your tests are completely normal, you will receive your results only by: MyChart Message (if you have MyChart) OR A paper copy in the mail If you have any lab test that is abnormal or we need to change your treatment, we will call you to review the results.   Testing/Procedures: Your physician has requested that you have a renal artery duplex. During this test, an ultrasound is used to evaluate blood flow to the kidneys. Take your medications as you usually do. This will take place at [address] [facility]) [address], [place] [address].  No food after 11PM the night before.  Water is OK. (Don't drink liquids if you have been instructed not to for ANOTHER test). Avoid foods that produce bowel gas, for 24 hours prior to exam (see below). No breakfast, no chewing gum, no smoking or carbonated beverages. Patient may take morning medications with water. Come in for test at least 15 minutes early to register.    Follow-Up: At [facility], you and your health needs are our priority.  As part of our continuing mission to provide you with exceptional heart care, we have created designated Provider Care Teams.  These Care Teams include your primary Cardiologist (physician) and Advanced Practice Providers (APPs -  Physician Assistants and Nurse Practitioners) who all work together to provide you with the care you need, when you need it.  We recommend signing up for the patient portal called "MyChart".  Sign up information is provided on this After Visit Summary.  MyChart is used to connect with patients for Virtual Visits (Telemedicine).  Patients are able to view lab/test results, encounter notes, upcoming appointments, etc.   Non-urgent messages can be sent to your provider as well.   To learn more about what you can do with MyChart, go to [email or web address].    Your next appointment:   3 month(s)  Provider:   You may see Lorine Bears, MD or one of the following Advanced Practice Providers on your designated Care Team:   Nicolasa Ducking, NP Eula Listen, PA-C Cadence Fransico Michael, PA-C Charlsie Quest, NP
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
## 2023-04-07 ENCOUNTER — Ambulatory Visit: Payer: Medicare Other

## 2023-04-07 DIAGNOSIS — M6281 Muscle weakness (generalized): Secondary | ICD-10-CM | POA: Diagnosis not present

## 2023-04-07 DIAGNOSIS — M25511 Pain in right shoulder: Secondary | ICD-10-CM | POA: Diagnosis not present

## 2023-04-12 DIAGNOSIS — M6281 Muscle weakness (generalized): Secondary | ICD-10-CM | POA: Diagnosis not present

## 2023-04-12 DIAGNOSIS — M25511 Pain in right shoulder: Secondary | ICD-10-CM | POA: Diagnosis not present

## 2023-04-14 DIAGNOSIS — M6281 Muscle weakness (generalized): Secondary | ICD-10-CM | POA: Diagnosis not present

## 2023-04-14 DIAGNOSIS — M25511 Pain in right shoulder: Secondary | ICD-10-CM | POA: Diagnosis not present

## 2023-04-21 ENCOUNTER — Encounter: Payer: Self-pay | Admitting: Nurse Practitioner

## 2023-04-21 ENCOUNTER — Ambulatory Visit (INDEPENDENT_AMBULATORY_CARE_PROVIDER_SITE_OTHER): Payer: Medicare Other | Admitting: Family Medicine

## 2023-04-21 VITALS — BP 134/80 | HR 71 | Temp 97.1°F | Ht 65.0 in | Wt 197.0 lb

## 2023-04-21 DIAGNOSIS — F411 Generalized anxiety disorder: Secondary | ICD-10-CM

## 2023-04-21 DIAGNOSIS — J018 Other acute sinusitis: Secondary | ICD-10-CM | POA: Diagnosis not present

## 2023-04-21 DIAGNOSIS — K580 Irritable bowel syndrome with diarrhea: Secondary | ICD-10-CM | POA: Diagnosis not present

## 2023-04-21 DIAGNOSIS — J019 Acute sinusitis, unspecified: Secondary | ICD-10-CM | POA: Insufficient documentation

## 2023-04-21 DIAGNOSIS — F331 Major depressive disorder, recurrent, moderate: Secondary | ICD-10-CM | POA: Diagnosis not present

## 2023-04-21 DIAGNOSIS — R1011 Right upper quadrant pain: Secondary | ICD-10-CM | POA: Diagnosis not present

## 2023-04-21 DIAGNOSIS — Z1231 Encounter for screening mammogram for malignant neoplasm of breast: Secondary | ICD-10-CM | POA: Diagnosis not present

## 2023-04-21 DIAGNOSIS — G8929 Other chronic pain: Secondary | ICD-10-CM | POA: Insufficient documentation

## 2023-04-21 MED ORDER — AMOXICILLIN 875 MG PO TABS
875.0000 mg | ORAL_TABLET | Freq: Two times a day (BID) | ORAL | 0 refills | Status: AC
Start: 2023-04-21 — End: 2023-05-01

## 2023-04-21 MED ORDER — PROMETHAZINE HCL 25 MG PO TABS: 25.0000 mg | ORAL_TABLET | Freq: Three times a day (TID) | ORAL | 0 refills | Status: DC | PRN
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
## 2023-04-21 NOTE — Patient Instructions (Signed)
Talk with pharmacist about Shingrix vaccine.

## 2023-04-23 NOTE — Assessment & Plan Note (Signed)
Management per specialist. 

## 2023-04-23 NOTE — Assessment & Plan Note (Signed)
Improved. Sent phenergan for nauseas

## 2023-04-23 NOTE — Assessment & Plan Note (Signed)
Stable

## 2023-04-23 NOTE — Assessment & Plan Note (Signed)
Sent Amoxicillin 875 mg BID

## 2023-04-24 ENCOUNTER — Encounter: Payer: Self-pay | Admitting: Family Medicine

## 2023-04-24 NOTE — Assessment & Plan Note (Signed)
Management by Dr. Evelene Croon

## 2023-04-27 ENCOUNTER — Encounter: Payer: Self-pay | Admitting: Family Medicine

## 2023-04-27 ENCOUNTER — Other Ambulatory Visit: Payer: Self-pay

## 2023-04-27 DIAGNOSIS — B379 Candidiasis, unspecified: Secondary | ICD-10-CM

## 2023-04-27 MED ORDER — FLUCONAZOLE 150 MG PO TABS
150.0000 mg | ORAL_TABLET | Freq: Once | ORAL | 0 refills | Status: AC
Start: 2023-04-27 — End: 2023-04-27

## 2023-05-11 ENCOUNTER — Ambulatory Visit
Admission: RE | Admit: 2023-05-11 | Discharge: 2023-05-11 | Disposition: A | Discharge status: Home / Self Care | Payer: Medicare Other | Source: Ambulatory Visit | Attending: Family Medicine | Admitting: Family Medicine

## 2023-05-11 DIAGNOSIS — Z1231 Encounter for screening mammogram for malignant neoplasm of breast: Secondary | ICD-10-CM | POA: Diagnosis not present

## 2023-05-13 ENCOUNTER — Ambulatory Visit: Payer: Medicare Other | Admitting: Physical Medicine and Rehabilitation

## 2023-05-18 ENCOUNTER — Other Ambulatory Visit: Payer: Self-pay | Admitting: Family Medicine

## 2023-05-24 ENCOUNTER — Ambulatory Visit (INDEPENDENT_AMBULATORY_CARE_PROVIDER_SITE_OTHER): Payer: Medicare Other | Admitting: Family Medicine

## 2023-05-24 VITALS — BP 118/68 | HR 63 | Temp 97.8°F | Ht 65.0 in | Wt 196.0 lb

## 2023-05-24 DIAGNOSIS — R300 Dysuria: Secondary | ICD-10-CM | POA: Diagnosis not present

## 2023-05-24 DIAGNOSIS — B029 Zoster without complications: Secondary | ICD-10-CM | POA: Diagnosis not present

## 2023-05-24 DIAGNOSIS — M797 Fibromyalgia: Secondary | ICD-10-CM

## 2023-05-24 DIAGNOSIS — G5701 Lesion of sciatic nerve, right lower limb: Secondary | ICD-10-CM | POA: Insufficient documentation

## 2023-05-24 DIAGNOSIS — M5431 Sciatica, right side: Secondary | ICD-10-CM | POA: Diagnosis not present

## 2023-05-24 LAB — POCT URINALYSIS DIP (CLINITEK)
Bilirubin, UA: NEGATIVE
Blood, UA: NEGATIVE
Glucose, UA: NEGATIVE mg/dL
Ketones, POC UA: NEGATIVE mg/dL
Leukocytes, UA: NEGATIVE
Nitrite, UA: NEGATIVE
POC PROTEIN,UA: NEGATIVE
Spec Grav, UA: 1.01 (ref 1.010–1.025)
Urobilinogen, UA: 0.2 E.U./dL
pH, UA: 6 (ref 5.0–8.0)

## 2023-05-24 MED ORDER — METHYLPREDNISOLONE 4 MG PO TBPK: ORAL_TABLET | ORAL | 1 refills | Status: DC

## 2023-05-24 MED ORDER — DICYCLOMINE HCL 10 MG PO CAPS: ORAL_CAPSULE | ORAL | 2 refills | Status: DC

## 2023-05-24 MED ORDER — VALACYCLOVIR HCL 500 MG PO TABS: 500.0000 mg | ORAL_TABLET | Freq: Three times a day (TID) | ORAL | 0 refills | Status: DC

## 2023-05-24 NOTE — Patient Instructions (Signed)
Fibromyalgia: Medrol Dosepak given.  Shingles: Medrol Dosepak and Valtrex given.  Irritable bowel syndrome: Dicyclomine (Bentyl) 10 mg before every meal and nightly as needed.
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
## 2023-05-27 NOTE — Assessment & Plan Note (Signed)
Check UA 

## 2023-05-27 NOTE — Assessment & Plan Note (Signed)
Medrol Dosepak and Valtrex given.

## 2023-05-27 NOTE — Assessment & Plan Note (Addendum)
Medrol Dosepak given.

## 2023-05-29 ENCOUNTER — Encounter: Payer: Self-pay | Admitting: Family Medicine

## 2023-05-29 NOTE — Assessment & Plan Note (Signed)
Medrol Dosepak

## 2023-06-01 ENCOUNTER — Ambulatory Visit: Payer: Medicare Other | Attending: Cardiovascular Disease

## 2023-06-01 DIAGNOSIS — I1A0 Resistant hypertension: Secondary | ICD-10-CM

## 2023-06-01 DIAGNOSIS — I10 Essential (primary) hypertension: Secondary | ICD-10-CM

## 2023-06-08 ENCOUNTER — Encounter
Payer: Medicare Other | Attending: Physical Medicine and Rehabilitation | Admitting: Physical Medicine and Rehabilitation

## 2023-06-08 ENCOUNTER — Encounter: Payer: Self-pay | Admitting: Physical Medicine and Rehabilitation

## 2023-06-08 VITALS — BP 138/87 | HR 71 | Ht 65.0 in | Wt 194.0 lb

## 2023-06-08 DIAGNOSIS — G894 Chronic pain syndrome: Secondary | ICD-10-CM

## 2023-06-08 DIAGNOSIS — G5701 Lesion of sciatic nerve, right lower limb: Secondary | ICD-10-CM | POA: Diagnosis not present

## 2023-06-08 DIAGNOSIS — M797 Fibromyalgia: Secondary | ICD-10-CM

## 2023-06-08 DIAGNOSIS — M159 Polyosteoarthritis, unspecified: Secondary | ICD-10-CM

## 2023-06-08 NOTE — Patient Instructions (Signed)
Pt is a 57 yr old female with hx of HTN, moderate persistent asthma; hypothyroidism- latest TSH 1.41, fibromyalgia, Migraines, and BMI 31; Told she has Kidney disease? Thinks due to swelling up so much- -  Latest BUN/Cr 22/0.77.  Has many allergies 30 meds- also has IBS  Likely has hypermobility and has severe arthritis.  Also has trochanteric bursitis.  F/U on chronic pain syndrome.     New R piriformis syndrome  Tennis ball- 10-15 minutes- sit on pillow on tennis ball- daily for 30 days, then as needed.  2.  Piriformis stretch- demonstrated how to do- reminding you- cross R leg over L-   3. Stretch called strain-counter strain-  against counter, chair, etc- hip and knee at 90 degrees- and then push on inner side of leg and outer side of leg as well.    4. Keep tennis ball in car- and use in car as well- max 45 minutes/day.    5.  We discussed risks/benefit of steroids- and explained why we cannot use more than 2x/year.    6. Doesn't take Dilaudid often- last filled 02/11/23- call me when needs refills.  Last time took one 2 weeks ago.   7. If the stretching doesn't work- we can either have you get a piriformis injection here or send her to see Dr Aleen Sells- who she's seen already-
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
## 2023-06-14 ENCOUNTER — Ambulatory Visit (INDEPENDENT_AMBULATORY_CARE_PROVIDER_SITE_OTHER): Payer: Medicare Other | Admitting: Family Medicine

## 2023-06-14 ENCOUNTER — Encounter: Payer: Self-pay | Admitting: Family Medicine

## 2023-06-14 VITALS — BP 130/80 | HR 73 | Temp 97.1°F | Ht 65.0 in | Wt 196.0 lb

## 2023-06-14 DIAGNOSIS — G894 Chronic pain syndrome: Secondary | ICD-10-CM | POA: Diagnosis not present

## 2023-06-14 DIAGNOSIS — F331 Major depressive disorder, recurrent, moderate: Secondary | ICD-10-CM

## 2023-06-14 DIAGNOSIS — E039 Hypothyroidism, unspecified: Secondary | ICD-10-CM

## 2023-06-14 DIAGNOSIS — I10 Essential (primary) hypertension: Secondary | ICD-10-CM | POA: Diagnosis not present

## 2023-06-14 DIAGNOSIS — E782 Mixed hyperlipidemia: Secondary | ICD-10-CM | POA: Diagnosis not present

## 2023-06-14 DIAGNOSIS — D8940 Mast cell activation, unspecified: Secondary | ICD-10-CM | POA: Diagnosis not present

## 2023-06-14 DIAGNOSIS — R051 Acute cough: Secondary | ICD-10-CM

## 2023-06-14 DIAGNOSIS — F411 Generalized anxiety disorder: Secondary | ICD-10-CM

## 2023-06-14 DIAGNOSIS — R7303 Prediabetes: Secondary | ICD-10-CM | POA: Diagnosis not present

## 2023-06-14 DIAGNOSIS — J45901 Unspecified asthma with (acute) exacerbation: Secondary | ICD-10-CM | POA: Diagnosis not present

## 2023-06-14 DIAGNOSIS — M797 Fibromyalgia: Secondary | ICD-10-CM

## 2023-06-14 MED ORDER — METHYLPREDNISOLONE 4 MG PO TBPK: ORAL_TABLET | ORAL | 1 refills | Status: DC

## 2023-06-14 MED ORDER — IPRATROPIUM-ALBUTEROL 0.5-2.5 (3) MG/3ML IN SOLN
3.0000 mL | Freq: Once | RESPIRATORY_TRACT | Status: DC
Start: 2023-06-14 — End: 2023-09-13

## 2023-06-14 NOTE — Assessment & Plan Note (Signed)
Recommend continue to work on eating healthy diet and exercise.  

## 2023-06-14 NOTE — Assessment & Plan Note (Signed)
The current medical regimen is effective;  continue present plan and medications. Continue Captopril 25 mg TID, Chlorthalidone 25 mg daily, furosemide 20 mg 2 times a day. Recommend continue to work on eating healthy diet and exercise. Check labs

## 2023-06-14 NOTE — Assessment & Plan Note (Signed)
Management per specialist. 

## 2023-06-14 NOTE — Assessment & Plan Note (Signed)
Management by Dr. Evelene Croon

## 2023-06-14 NOTE — Assessment & Plan Note (Signed)
The current medical regimen is effective;  continue present plan and medications. Synthroid 112 mcg once daily

## 2023-06-14 NOTE — Assessment & Plan Note (Addendum)
Not at goal.  But greatly improved. Continue repatha 140 mg IM every 2 weeks.  Continue to work on eating a healthy diet and exercise.  Labs drawn today.

## 2023-06-14 NOTE — Patient Instructions (Addendum)
For asthma exacerbation: Given Medrol Dosepak. Recommended talk with Phramacist about Shingrix vaccines Flu shot available at our office 07/05/2023

## 2023-06-15 ENCOUNTER — Other Ambulatory Visit: Payer: Medicare Other

## 2023-06-15 DIAGNOSIS — E782 Mixed hyperlipidemia: Secondary | ICD-10-CM | POA: Diagnosis not present

## 2023-06-15 DIAGNOSIS — R7303 Prediabetes: Secondary | ICD-10-CM | POA: Diagnosis not present

## 2023-06-15 DIAGNOSIS — I10 Essential (primary) hypertension: Secondary | ICD-10-CM | POA: Diagnosis not present

## 2023-06-19 DIAGNOSIS — R051 Acute cough: Secondary | ICD-10-CM | POA: Insufficient documentation

## 2023-06-19 DIAGNOSIS — J45901 Unspecified asthma with (acute) exacerbation: Secondary | ICD-10-CM | POA: Insufficient documentation

## 2023-06-19 DIAGNOSIS — D8940 Mast cell activation, unspecified: Secondary | ICD-10-CM | POA: Insufficient documentation

## 2023-06-19 NOTE — Assessment & Plan Note (Addendum)
Order nebulization with Duoneb #1. Started on Medrol Dosepak. Recommend DuoNeb treatment or albuterol inhaler every 4-6 hours over the next 48 hours and then 4 to 6 hours as needed.

## 2023-06-19 NOTE — Assessment & Plan Note (Signed)
Management by pain clinic.  I will leave orthopedic x-rays to their clinic to do.

## 2023-06-19 NOTE — Assessment & Plan Note (Addendum)
Order Chest Xray 2V.  Likely secondary to asthma.  Would recommend get chest x-ray if not improving.

## 2023-06-19 NOTE — Assessment & Plan Note (Signed)
Seeing pain management and getting myofascial trigger point injections on the right basis

## 2023-06-19 NOTE — Assessment & Plan Note (Signed)
Managed by allergy and immunology.

## 2023-06-27 ENCOUNTER — Other Ambulatory Visit: Payer: Self-pay | Admitting: Family Medicine

## 2023-06-27 DIAGNOSIS — E039 Hypothyroidism, unspecified: Secondary | ICD-10-CM

## 2023-06-29 ENCOUNTER — Other Ambulatory Visit: Payer: Self-pay | Admitting: Family Medicine

## 2023-06-29 ENCOUNTER — Other Ambulatory Visit: Payer: Self-pay | Admitting: Cardiovascular Disease

## 2023-07-01 ENCOUNTER — Encounter: Payer: Self-pay | Admitting: Nurse Practitioner

## 2023-07-01 ENCOUNTER — Other Ambulatory Visit: Payer: Medicare Other

## 2023-07-01 ENCOUNTER — Ambulatory Visit: Payer: Medicare Other | Admitting: Nurse Practitioner

## 2023-07-01 VITALS — BP 130/80 | HR 82 | Ht 65.0 in | Wt 197.0 lb

## 2023-07-01 DIAGNOSIS — R1011 Right upper quadrant pain: Secondary | ICD-10-CM

## 2023-07-01 DIAGNOSIS — K219 Gastro-esophageal reflux disease without esophagitis: Secondary | ICD-10-CM

## 2023-07-01 MED ORDER — FAMOTIDINE 20 MG PO TABS: 20.0000 mg | ORAL_TABLET | Freq: Two times a day (BID) | ORAL | 1 refills | Status: DC

## 2023-07-01 NOTE — Patient Instructions (Addendum)
You have been scheduled for an endoscopy. Please follow written instructions given to you at your visit today.  If you use inhalers (even only as needed), please bring them with you on the day of your procedure.  If you take any of the following medications, they will need to be adjusted prior to your procedure:  ______________________________________________  Your provider has requested that you go to the basement level for lab work before leaving today. Press "B" on the elevator. The lab is located at the first door on the left as you exit the elevator.  Due to recent changes in healthcare laws, you may see the results of your imaging and laboratory studies on MyChart before your provider has had a chance to review them.  We understand that in some cases there may be results that are confusing or concerning to you. Not all laboratory results come back in the same time frame and the provider may be waiting for multiple results in order to interpret others.  Please give [place] 48 hours in order for your provider to thoroughly review all the results before contacting the office for clarification of your results.   Thank you for trusting me with your gastrointestinal care!   Alcide Evener, CRNP
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
## 2023-07-02 ENCOUNTER — Encounter: Payer: Self-pay | Admitting: Allergy and Immunology

## 2023-07-02 LAB — IGA: Immunoglobulin A: 110 mg/dL (ref 47–310)

## 2023-07-02 LAB — TISSUE TRANSGLUTAMINASE ABS,IGG,IGA
(tTG) Ab, IgA: <1 U/mL
(tTG) Ab, IgG: <1 U/mL

## 2023-07-06 ENCOUNTER — Encounter: Payer: Self-pay | Admitting: Physician Assistant

## 2023-07-06 ENCOUNTER — Ambulatory Visit (INDEPENDENT_AMBULATORY_CARE_PROVIDER_SITE_OTHER): Payer: Medicare Other | Admitting: Physician Assistant

## 2023-07-06 VITALS — BP 130/80 | HR 73 | Temp 98.5°F | Resp 14 | Wt 196.0 lb

## 2023-07-06 DIAGNOSIS — L304 Erythema intertrigo: Secondary | ICD-10-CM

## 2023-07-06 DIAGNOSIS — L280 Lichen simplex chronicus: Secondary | ICD-10-CM | POA: Insufficient documentation

## 2023-07-06 MED ORDER — TERBINAFINE HCL 1 % EX CREA
1.0000 | TOPICAL_CREAM | Freq: Two times a day (BID) | CUTANEOUS | 0 refills | Status: DC
Start: 2023-07-06 — End: 2023-08-24

## 2023-07-06 MED ORDER — TRIAMCINOLONE ACETONIDE 40 MG/ML IJ SUSP
80.0000 mg | Freq: Once | INTRAMUSCULAR | Status: AC
Start: 2023-07-06 — End: 2023-07-06
  Administered 2023-07-06: 80 mg via INTRAMUSCULAR

## 2023-07-06 NOTE — Assessment & Plan Note (Signed)
Given Kenalog 80mg  injection in office Continue to monitor severity and symptoms Will adjust treatment as needed depending on symptoms

## 2023-07-06 NOTE — Assessment & Plan Note (Signed)
Prescribed Lamisil 1% creme Patient will use if rash does not change within the next few days
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
## 2023-07-12 ENCOUNTER — Ambulatory Visit: Payer: Medicare Other | Attending: Cardiovascular Disease | Admitting: Cardiovascular Disease

## 2023-07-12 ENCOUNTER — Other Ambulatory Visit: Payer: Self-pay | Admitting: *Deleted

## 2023-07-12 ENCOUNTER — Encounter: Payer: Self-pay | Admitting: Physician Assistant

## 2023-07-12 ENCOUNTER — Encounter: Payer: Self-pay | Admitting: Cardiovascular Disease

## 2023-07-12 VITALS — BP 122/70 | HR 74 | Ht 65.0 in | Wt 194.2 lb

## 2023-07-12 DIAGNOSIS — I10 Essential (primary) hypertension: Secondary | ICD-10-CM

## 2023-07-12 DIAGNOSIS — I493 Ventricular premature depolarization: Secondary | ICD-10-CM

## 2023-07-12 DIAGNOSIS — E785 Hyperlipidemia, unspecified: Secondary | ICD-10-CM | POA: Diagnosis not present

## 2023-07-12 LAB — ANAEROBIC AND AEROBIC CULTURE

## 2023-07-12 MED ORDER — XOLAIR 300 MG/2ML ~~LOC~~ SOSY: 300.0000 mg | PREFILLED_SYRINGE | SUBCUTANEOUS | 11 refills | Status: DC

## 2023-07-12 NOTE — Patient Instructions (Signed)
Medication Instructions:  No changes *If you need a refill on your cardiac medications before your next appointment, please call your pharmacy*   Lab Work: None ordered If you have labs (blood work) drawn today and your tests are completely normal, you will receive your results only by: MyChart Message (if you have MyChart) OR A paper copy in the mail If you have any lab test that is abnormal or we need to change your treatment, we will call you to review the results.   Testing/Procedures: None ordered   Follow-Up: At [facility], you and your health needs are our priority.  As part of our continuing mission to provide you with exceptional heart care, we have created designated Provider Care Teams.  These Care Teams include your primary Cardiologist (physician) and Advanced Practice Providers (APPs -  Physician Assistants and Nurse Practitioners) who all work together to provide you with the care you need, when you need it.  We recommend signing up for the patient portal called "MyChart".  Sign up information is provided on this After Visit Summary.  MyChart is used to connect with patients for Virtual Visits (Telemedicine).  Patients are able to view lab/test results, encounter notes, upcoming appointments, etc.  Non-urgent messages can be sent to your provider as well.   To learn more about what you can do with MyChart, go to [email or web address].    Your next appointment:   6 month(s)  Provider:   You may see Muhammad Arida, MD or one of the following Advanced Practice Providers on your designated Care Team:   Christopher Berge, NP Ryan Dunn, PA-C Cadence Furth, PA-C Sheri Hammock, NP    
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
## 2023-07-14 ENCOUNTER — Encounter: Payer: Self-pay | Admitting: Physician Assistant

## 2023-07-15 ENCOUNTER — Other Ambulatory Visit: Payer: Self-pay | Admitting: Physician Assistant

## 2023-07-15 DIAGNOSIS — D8940 Mast cell activation, unspecified: Secondary | ICD-10-CM

## 2023-07-15 MED ORDER — TRIAMCINOLONE ACETONIDE 0.1 % EX CREA
1.0000 | TOPICAL_CREAM | Freq: Two times a day (BID) | CUTANEOUS | 0 refills | Status: DC
Start: 2023-07-15 — End: 2023-08-24

## 2023-07-27 ENCOUNTER — Encounter: Payer: Self-pay | Admitting: Internal Medicine

## 2023-07-27 ENCOUNTER — Ambulatory Visit: Payer: Medicare Other | Admitting: Internal Medicine

## 2023-07-27 VITALS — BP 126/82 | HR 67 | Temp 97.8°F | Resp 16 | Ht 65.0 in | Wt 197.0 lb

## 2023-07-27 DIAGNOSIS — R1011 Right upper quadrant pain: Secondary | ICD-10-CM | POA: Diagnosis not present

## 2023-07-27 DIAGNOSIS — K319 Disease of stomach and duodenum, unspecified: Secondary | ICD-10-CM | POA: Diagnosis not present

## 2023-07-27 DIAGNOSIS — K219 Gastro-esophageal reflux disease without esophagitis: Secondary | ICD-10-CM | POA: Diagnosis not present

## 2023-07-27 DIAGNOSIS — I10 Essential (primary) hypertension: Secondary | ICD-10-CM | POA: Diagnosis not present

## 2023-07-27 MED ORDER — SODIUM CHLORIDE 0.9 % IV SOLN
500.0000 mL | INTRAVENOUS | Status: DC
Start: 2023-07-27 — End: 2023-07-27

## 2023-07-27 NOTE — Progress Notes (Signed)
Report to PACU, RN, vss, BBS= Clear.  

## 2023-07-27 NOTE — Progress Notes (Signed)
Patient states there have been no changes to medical or surgical history since time of pre-visit. 

## 2023-07-27 NOTE — Op Note (Addendum)
[facility] Patient Name: Laura [name] Procedure Date: 07/27/2023 9:41 AM MRN: [id number] Endoscopist: Iva Boop , MD, [id number] Age: 57 Referring MD:  Date of Birth: 1966-08-18 Gender: Female Account #: [id number] Procedure:                Upper GI endoscopy Indications:              Abdominal pain in the right upper quadrant Medicines:                Monitored Anesthesia Care Procedure:                Pre-Anesthesia Assessment:                           - Prior to the procedure, a History and Physical                            was performed, and patient medications and                            allergies were reviewed. The patient's tolerance of                            previous anesthesia was also reviewed. The risks                            and benefits of the procedure and the sedation                            options and risks were discussed with the patient.                            All questions were answered, and informed consent                            was obtained. Prior Anticoagulants: The patient has                            taken no anticoagulant or antiplatelet agents. ASA                            Grade Assessment: III - A patient with severe                            systemic disease. After reviewing the risks and                            benefits, the patient was deemed in satisfactory                            condition to undergo the procedure.                           After obtaining informed consent, the endoscope was  passed under direct vision. Throughout the                            procedure, the patient's blood pressure, pulse, and                            oxygen saturations were monitored continuously. The                            GIF [id number] #[id number] was introduced through the                            mouth, and advanced to the second part of duodenum.                            The  upper GI endoscopy was accomplished without                            difficulty. The patient tolerated the procedure                            well. Scope In: Scope Out: Findings:                 The examined esophagus was normal.                           Patchy mildly erythematous mucosa was found in the                            entire examined stomach. Biopsies were taken with a                            cold forceps for histology. Verification of patient                            identification for the specimen was done. Estimated                            blood loss was minimal.                           The examined duodenum was normal.                           The cardia and gastric fundus were normal on                            retroflexion. Complications:            No immediate complications. Estimated Blood Loss:     Estimated blood loss was minimal. Impression:               - Normal esophagus.                           - Erythematous mucosa  in the stomach. Biopsied.                            Antrum and body R/O gastritis and H pylori                           - Normal examined duodenum. Recommendation:           - Patient has a contact number available for                            emergencies. The signs and symptoms of potential                            delayed complications were discussed with the                            patient. Return to normal activities tomorrow.                            Written discharge instructions were provided to the                            patient.                           - Resume previous diet.                           - Continue present medications.                           - Await pathology results.                           - I very much doubt her RUQ pain is from the gut.                            She has fibromyalgia and a right shoulder problem.                            Gallbladder/biliary eval w/ [place], HIDA  negative more                            than once. Prior EGD unrevealing also -had mano and                            pH/impedance studies - ineffective esophageal                            motility but otherwise both NL.                           ? if PT would help (she has seen for shoulder I  think), possibly abdominal wall injection. Empiric                            cholecystectomy could be done but I am not                            recommending at this time.                           She is follwed by PM & R and has seen Dr. 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 Will ask Dr. Lucie Leather.                           - Conversation in recovery - had dry needling "all                            over" has not asked PT for help w/ this RUQ pain                            that radiates into shoulder - I will also ask Dr.                            Berline Chough for any thoughts Iva Boop, MD 07/27/2023 10:07:35 AM This report has been signed electronically. Addendum Number: 1   Addendum Date: 07/29/2023 8:46:18 AM      Aditional comments from patient after review of my note:      After reviewing the results from the endoscopy, I would like to clarify       a couple of things. The pain in my stomach does not hurt when I raise my       legs. It hurts when it's been poked at. And my upper right quadrant is       tender to the touch. Generally feels like someone has poured hot acid in       my stomach. This does not radiate into my shoulder. It radiates directly       through to my back. The problems with the shoulder that have required       physical therapy is due a fairly extensive rotator cuff repair that       required physical therapy. That has since been resolved and I no longer       receive physical  therapy.      The injections that I have received from Dr Sharia Reeve are for myofascial       pain syndrome and are generally in my back and my neck. I truly felt all       of this needed to be  clarified. Iva Boop, MD 07/29/2023 8:47:00 AM This report has been signed electronically.
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
## 2023-07-27 NOTE — Patient Instructions (Addendum)
I did not see an obvious cause of problems. I did biopsy the stomach to see if there is an infection present - for completeness.  I do not think your symptoms are from a gut problem (or gallbladder).  I suspect it is a muscular problem - ? If related to fibromyalgia and/or right shoulder issues. I see that you have been going to PT. If you have not asked them if they can help with this pain you should.  I appreciate the opportunity to care for you. Iva Boop, MD, FACG   YOU HAD AN ENDOSCOPIC PROCEDURE TODAY AT THE [facility]:   Refer to the procedure report that was given to you for any specific questions about what was found during the examination.  If the procedure report does not answer your questions, please call your gastroenterologist to clarify.  If you requested that your care partner not be given the details of your procedure findings, then the procedure report has been included in a sealed envelope for you to review at your convenience later.  YOU SHOULD EXPECT: Some feelings of bloating in the abdomen. Passage of more gas than usual.  Walking can help get rid of the air that was put into your GI tract during the procedure and reduce the bloating. If you had a lower endoscopy (such as a colonoscopy or flexible sigmoidoscopy) you may notice spotting of blood in your stool or on the toilet paper. If you underwent a bowel prep for your procedure, you may not have a normal bowel movement for a few days.  Please Note:  You might notice some irritation and congestion in your nose or some drainage.  This is from the oxygen used during your procedure.  There is no need for concern and it should clear up in a day or so.  SYMPTOMS TO REPORT IMMEDIATELY:  Following upper endoscopy (EGD)  Vomiting of blood or coffee ground material  New chest pain or pain under the shoulder blades  Painful or persistently difficult swallowing  New shortness of breath  Fever of 100F or  higher  Black, tarry-looking stools  For urgent or emergent issues, a gastroenterologist can be reached at any hour by calling [phone]. Do not use MyChart messaging for urgent concerns.    DIET:  We do recommend a small meal at first, but then you may proceed to your regular diet.  Drink plenty of fluids but you should avoid alcoholic beverages for 24 hours.  ACTIVITY:  You should plan to take it easy for the rest of today and you should NOT DRIVE or use heavy machinery until tomorrow (because of the sedation medicines used during the test).    FOLLOW UP: Our staff will call the number listed on your records the next business day following your procedure.  We will call around 7:15- 8:00 am to check on you and address any questions or concerns that you may have regarding the information given to you following your procedure. If we do not reach you, we will leave a message.     If any biopsies were taken you will be contacted by phone or by letter within the next 1-3 weeks.  Please call us at [phone] if you have not heard about the biopsies in 3 weeks.    SIGNATURES/CONFIDENTIALITY: You and/or your care partner have signed paperwork which will be entered into your electronic medical record.  These signatures attest to the fact that that the information above  on your After Visit Summary has been reviewed and is understood.  Full responsibility of the confidentiality of this discharge information lies with you and/or your care-partner.

## 2023-07-27 NOTE — Progress Notes (Signed)
History and Physical Interval Note:  07/27/2023 9:44 AM  Laura [name]  has presented today for endoscopic procedure(s), with the diagnosis of  Encounter Diagnoses  Name Primary?   RUQ pain Yes   Gastroesophageal reflux disease without esophagitis   .  The various methods of evaluation and treatment have been discussed with the patient and/or family. After consideration of risks, benefits and other options for treatment, the patient has consented to  the endoscopic procedure(s).   The patient's history has been reviewed, patient examined, no change in status, stable for endoscopic procedure(s).  I have reviewed the patient's chart and labs.  Questions were answered to the patient's satisfaction.     Iva Boop, MD, [facility]

## 2023-07-28 ENCOUNTER — Encounter: Payer: Self-pay | Admitting: Internal Medicine

## 2023-07-28 ENCOUNTER — Telehealth: Payer: Self-pay | Admitting: *Deleted

## 2023-07-28 NOTE — Telephone Encounter (Signed)
  Follow up Call-     07/27/2023    9:18 AM  Call back number  Post procedure Call Back phone  # [phone]     Patient questions:  Do you have a fever, pain , or abdominal swelling? No. Pain Score  0 * PATIENT  HAS SHOULDER PAIN THAT SHE STATES "IS RELATED TO HER ROTATOR CUFF SURGERY AND NOT MY STOMACH".   Have you tolerated food without any problems? YES  Have you been able to return to your normal activities? YES  Do you have any questions about your discharge instructions: Diet   No. Medications  No. Follow up visit  No.  Do you have questions or concerns about your Care? No.  Actions: * If pain score is 4 or above: No action needed, pain <4.

## 2023-08-01 LAB — SURGICAL PATHOLOGY

## 2023-08-03 ENCOUNTER — Telehealth: Payer: Self-pay | Admitting: Internal Medicine

## 2023-08-03 NOTE — Telephone Encounter (Signed)
Called and explained that gastric biopsies do not show a significant abnormality (reactive gastropathy)  I do not know why she has pain  Had messaged w/ Dr. Berline Chough and she raised ? Of T-spine/back as a source  I think evaluating that is reasonable as I have seen at least 1 case over the years (benign spinal tumor) causing something similar.  The patient has an upcoming appointment w/ Dr. Berline Chough and will discuss  If spine/back eval is unrevealing she could consider an empiric cholecystectomy though I think low likelihood of relief given her fibromyalgia and other medical problems.

## 2023-08-09 ENCOUNTER — Other Ambulatory Visit: Payer: Self-pay | Admitting: Family Medicine

## 2023-08-09 DIAGNOSIS — I10 Essential (primary) hypertension: Secondary | ICD-10-CM

## 2023-08-11 DIAGNOSIS — L986 Other infiltrative disorders of the skin and subcutaneous tissue: Secondary | ICD-10-CM | POA: Diagnosis not present

## 2023-08-11 DIAGNOSIS — L299 Pruritus, unspecified: Secondary | ICD-10-CM | POA: Diagnosis not present

## 2023-08-11 DIAGNOSIS — D485 Neoplasm of uncertain behavior of skin: Secondary | ICD-10-CM | POA: Diagnosis not present

## 2023-08-24 ENCOUNTER — Telehealth: Payer: Self-pay

## 2023-08-24 ENCOUNTER — Ambulatory Visit (INDEPENDENT_AMBULATORY_CARE_PROVIDER_SITE_OTHER): Payer: Medicare Other | Admitting: Allergy and Immunology

## 2023-08-24 VITALS — BP 136/84 | HR 60 | Resp 10 | Ht 64.0 in | Wt 191.8 lb

## 2023-08-24 DIAGNOSIS — T7800XA Anaphylactic reaction due to unspecified food, initial encounter: Secondary | ICD-10-CM

## 2023-08-24 DIAGNOSIS — T7800XD Anaphylactic reaction due to unspecified food, subsequent encounter: Secondary | ICD-10-CM

## 2023-08-24 DIAGNOSIS — D8940 Mast cell activation, unspecified: Secondary | ICD-10-CM

## 2023-08-24 DIAGNOSIS — D4701 Cutaneous mastocytosis: Secondary | ICD-10-CM

## 2023-08-24 MED ORDER — TACROLIMUS 0.1 % EX OINT: TOPICAL_OINTMENT | CUTANEOUS | 3 refills | Status: DC

## 2023-08-24 NOTE — Patient Instructions (Addendum)
  1.  Avoidance measures - shellfish  2.  Everyday use the following:   A. Cetirizine 10 mg - 2 tablets 2 times per day  B. Protopic 0.1% - apply to inflamed skin 3-7 times a week  3. Epi-pen if needed  4. Further evaluation??? Yes, if persistent   5. Contact clinic with update in 4 weeks
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
## 2023-08-24 NOTE — Telephone Encounter (Signed)
*  Asthma/Allergy  Pharmacy Patient Advocate Encounter   Received notification from CoverMyMeds that prior authorization for Tacrolimus 0.1% ointment  is required/requested.   Insurance verification completed.   The patient is insured through [facility] .   Per test claim: PA required; PA submitted to [facility] via CoverMyMeds Key/confirmation #/EOC [id number] Status is pending

## 2023-08-25 ENCOUNTER — Encounter: Payer: Self-pay | Admitting: Physical Medicine and Rehabilitation

## 2023-08-25 ENCOUNTER — Encounter: Payer: Self-pay | Admitting: Allergy and Immunology

## 2023-08-25 ENCOUNTER — Encounter: Payer: Self-pay | Admitting: Family Medicine

## 2023-08-25 ENCOUNTER — Other Ambulatory Visit: Payer: Self-pay | Admitting: Physical Medicine and Rehabilitation

## 2023-08-25 ENCOUNTER — Encounter: Payer: Self-pay | Admitting: *Deleted

## 2023-08-25 MED ORDER — HYDROCORTISONE 2.5 % EX OINT: TOPICAL_OINTMENT | CUTANEOUS | 5 refills | Status: DC

## 2023-08-25 NOTE — Telephone Encounter (Signed)
Talked with Dr. Lucie Leather.  Per Dr. Lucie Leather try Hydrocortisone 2.5 % ointment.  Called and informed patient.  Will send in ERX.  I asked her to update [place] in about 2 weeks.

## 2023-08-25 NOTE — Addendum Note (Signed)
Addended by: Alphonzo Cruise on: 08/25/2023 05:13 PM   Modules accepted: Orders

## 2023-08-25 NOTE — Telephone Encounter (Signed)
Pharmacy Patient Advocate Encounter  Received notification from [facility] that Prior Authorization for Tacrolimus 0.1% has been DENIED.  See denial reason below. No denial letter attached in CMM. Will attache denial letter to Media tab once received.   PA #/Case ID/Reference #: Decision Notes: Tacrolimus ointment is denied for not meeting the step therapy requirement. Medication authorization requires the following: One of the following: (1) You need to try two (2) of the following covered drugs: (a) Ala-Cort 2.5% or hydrocortisone 2.5% cream. (b) Augmented betamethasone 0.05%. (c) Desonide ointment. (d) Fluocinonide 0.05%. (e) Hydrocortisone 2.5% ointment. (2) Your doctor needs to give [place] specific medical reasons why you cannot take the covered drug(s

## 2023-08-26 MED ORDER — HYDROMORPHONE HCL 2 MG PO TABS: 2.0000 mg | ORAL_TABLET | Freq: Four times a day (QID) | ORAL | 0 refills | Status: DC | PRN

## 2023-08-29 ENCOUNTER — Telehealth: Payer: Self-pay | Admitting: Pharmacist

## 2023-08-29 NOTE — Progress Notes (Signed)
08/29/2023 Name: OLIVIA CARULLI MRN: [id number] DOB: [birth year]  Attempted to contact patient in response to PCP inbasket message for medication access, cost barrier of repatha. Left HIPAA compliant message for patient. Will complete ongoing attempts.   Lynnda Shields, PharmD, BCPS Clinical Pharmacist [facility]

## 2023-08-30 NOTE — Telephone Encounter (Signed)
The patient called back this morning returning a call to Laura [name] [facility]. She is requesting Thurston Hole to call her back.

## 2023-08-31 ENCOUNTER — Encounter: Payer: Self-pay | Admitting: Pharmacist

## 2023-08-31 ENCOUNTER — Other Ambulatory Visit: Payer: Medicare Other | Admitting: Pharmacist

## 2023-08-31 NOTE — Progress Notes (Signed)
08/31/2023 Name: Laura [name] MRN: [id number] DOB: 04/28/1966  Chief Complaint  Patient presents with   Medication Assistance    Care Coordination  Laura [name] is a 57 y.o. year old female who presented for a telephone visit.   They were referred to the pharmacist by their PCP for assistance in managing medication access - repatha cost.    S/O: Reviewed repatha prescription with patient and cost barrier. Previously $131 for prescription, now >$500 (likely in donut hole) and cost prohibitive.  Completed investigation and noticed patient was previously enrolled in [employer]. Applied for funding today, and submitted medicare card to portal for benefit verification.    A/P: - Recommend patient contact their optum pharmacy and provide the following card information to cover the prescription copay, below - Patient verbalized understanding, was appreciative  Healthwell Information: HealthWell ID: [id number] Patient: [name] Status: Approved Sub Status: Active Start Date 08/01/2023 End Date 07/30/2024  Card No.: [id number] Card Status: Active BIN: [id number] PCN: [id number] PC Group: [id number] Help Desk [phone]  Follow-up: none scheduled, provided careguide contact number if patient needs future phone appointment with pharmacist team    Lynnda Shields, PharmD, BCPS Clinical Pharmacist [facility]

## 2023-09-05 ENCOUNTER — Encounter: Payer: Self-pay | Admitting: Allergy and Immunology

## 2023-09-06 ENCOUNTER — Other Ambulatory Visit: Payer: Self-pay | Admitting: *Deleted

## 2023-09-06 DIAGNOSIS — D8940 Mast cell activation, unspecified: Secondary | ICD-10-CM

## 2023-09-06 DIAGNOSIS — D4701 Cutaneous mastocytosis: Secondary | ICD-10-CM

## 2023-09-06 MED ORDER — OPZELURA 1.5 % EX CREA: TOPICAL_CREAM | CUTANEOUS | 5 refills | Status: DC

## 2023-09-07 ENCOUNTER — Telehealth: Payer: Self-pay

## 2023-09-07 ENCOUNTER — Telehealth: Payer: Self-pay | Admitting: *Deleted

## 2023-09-07 ENCOUNTER — Other Ambulatory Visit: Payer: Self-pay | Admitting: Allergy and Immunology

## 2023-09-07 ENCOUNTER — Other Ambulatory Visit: Payer: Self-pay | Admitting: Family Medicine

## 2023-09-07 DIAGNOSIS — I10 Essential (primary) hypertension: Secondary | ICD-10-CM

## 2023-09-07 DIAGNOSIS — D8940 Mast cell activation, unspecified: Secondary | ICD-10-CM

## 2023-09-07 DIAGNOSIS — D4701 Cutaneous mastocytosis: Secondary | ICD-10-CM

## 2023-09-07 NOTE — Telephone Encounter (Signed)
Pharmacy Patient Advocate Encounter  Received notification from [facility] that Prior Authorization for Opzelura has been APPROVED from 09/07/2023 to 10/31/2024

## 2023-09-07 NOTE — Telephone Encounter (Signed)
*  Asthma/Allergy  Pharmacy Patient Advocate Encounter   Received notification from CoverMyMeds that prior authorization for Opzelura 1.5% cream  is required/requested.   Insurance verification completed.   The patient is insured through [facility] .   Per test claim: PA required; PA submitted to above mentioned insurance via CoverMyMeds Key/confirmation #/EOC [facility] Status is pending

## 2023-09-07 NOTE — Telephone Encounter (Signed)
Per Dr. Lucie Leather:  We can try to apply Opzulera on your skin lesion if insurance will allow [place] to use. Usually they will allow Opzulera if you fail tacrolimus / Protopic / Elidel.   And we need to see if the mast cell problem has expanded into other parts of your body as there is another treatment available for that situation.so lets check the following:   24 hour urine for leukotriene E4, prostaglandin D2, methyl histamine, and blood for tryptase, C-kit mutation     Glorianne aware and Opzelera sent and labs ordered.

## 2023-09-08 ENCOUNTER — Other Ambulatory Visit: Payer: Self-pay | Admitting: Allergy and Immunology

## 2023-09-08 DIAGNOSIS — D4701 Cutaneous mastocytosis: Secondary | ICD-10-CM | POA: Diagnosis not present

## 2023-09-12 ENCOUNTER — Encounter: Payer: Medicare Other | Admitting: Physical Medicine and Rehabilitation

## 2023-09-12 DIAGNOSIS — D8940 Mast cell activation, unspecified: Secondary | ICD-10-CM | POA: Diagnosis not present

## 2023-09-12 DIAGNOSIS — D4701 Cutaneous mastocytosis: Secondary | ICD-10-CM | POA: Diagnosis not present

## 2023-09-13 ENCOUNTER — Ambulatory Visit (INDEPENDENT_AMBULATORY_CARE_PROVIDER_SITE_OTHER): Payer: Medicare Other | Admitting: Physician Assistant

## 2023-09-13 ENCOUNTER — Encounter: Payer: Self-pay | Admitting: Physician Assistant

## 2023-09-13 ENCOUNTER — Ambulatory Visit: Payer: Medicare Other

## 2023-09-13 VITALS — Wt 194.0 lb

## 2023-09-13 VITALS — BP 128/72 | HR 78 | Temp 97.6°F | Ht 64.0 in | Wt 194.8 lb

## 2023-09-13 DIAGNOSIS — E782 Mixed hyperlipidemia: Secondary | ICD-10-CM | POA: Diagnosis not present

## 2023-09-13 DIAGNOSIS — R7303 Prediabetes: Secondary | ICD-10-CM

## 2023-09-13 DIAGNOSIS — Z0000 Encounter for general adult medical examination without abnormal findings: Secondary | ICD-10-CM | POA: Diagnosis not present

## 2023-09-13 DIAGNOSIS — J018 Other acute sinusitis: Secondary | ICD-10-CM | POA: Diagnosis not present

## 2023-09-13 DIAGNOSIS — I10 Essential (primary) hypertension: Secondary | ICD-10-CM | POA: Diagnosis not present

## 2023-09-13 DIAGNOSIS — G43011 Migraine without aura, intractable, with status migrainosus: Secondary | ICD-10-CM | POA: Diagnosis not present

## 2023-09-13 MED ORDER — UBRELVY 100 MG PO TABS: 100.0000 mg | ORAL_TABLET | Freq: Two times a day (BID) | ORAL | 2 refills | Status: AC | PRN

## 2023-09-13 MED ORDER — AZITHROMYCIN 250 MG PO TABS: ORAL_TABLET | ORAL | 0 refills | Status: DC

## 2023-09-13 NOTE — Assessment & Plan Note (Signed)
Prescribed Z-pac Will continue to monitor symptoms Will adjust antibiotic if symptoms do not improve or worsen

## 2023-09-13 NOTE — Assessment & Plan Note (Signed)
Controlled through diet and exercise Labs drawn today Will adjust treatment depending on results Continue to work on diet and exercise.

## 2023-09-13 NOTE — Assessment & Plan Note (Signed)
Well controlled.  Continue to work on eating a healthy diet and exercise.  Labs drawn today.   No major side effects reported, and no issues with compliance. The current medical regimen is effective;  continue present plan with Captopril 25mg  Carvedilol 6.25mg , Lasix 20mg ,  Will adjust medication as needed depending on labs BP Readings from Last 3 Encounters:  09/13/23 128/72  08/24/23 136/84  07/27/23 126/82
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
## 2023-09-13 NOTE — Assessment & Plan Note (Signed)
Has tried tylenol, ibuprofen, Excedrin, Sumatriptan 25mg  with no improvement in symptoms Prescribed Ubrelvy 100mg  Continue to monitor symptoms

## 2023-09-13 NOTE — Patient Instructions (Signed)
Laura [name] , Thank you for taking time to come for your Medicare Wellness Visit. I appreciate your ongoing commitment to your health goals. Please review the following plan we discussed and let me know if I can assist you in the future.   Referrals/Orders/Follow-Ups/Clinician Recommendations: Aim for 30 minutes of exercise or brisk walking, 6-8 glasses of water, and 5 servings of fruits and vegetables each day. Each day, aim for 6 glasses of water, plenty of protein in your diet and try to get up and walk/ stretch every hour for 5-10 minutes at a time.    This is a list of the screening recommended for you and due dates:  Health Maintenance  Topic Date Due   Zoster (Shingles) Vaccine (1 of 2) Never done   Flu Shot  06/02/2023   COVID-19 Vaccine (5 - 2023-24 season) 07/03/2023   Mammogram  05/10/2024   Medicare Annual Wellness Visit  09/12/2024   DTaP/Tdap/Td vaccine (3 - Td or Tdap) 05/25/2026   Colon Cancer Screening  09/01/2029   Hepatitis C Screening  Completed   HIV Screening  Completed   HPV Vaccine  Aged Out    Advanced directives: (Declined) Advance directive discussed with you today. Even though you declined this today, please call our office should you change your mind, and we can give you the proper paperwork for you to fill out.  Next Medicare Annual Wellness Visit scheduled for next year: Yes

## 2023-09-13 NOTE — Assessment & Plan Note (Signed)
Well controlled.  Continue to work on eating a healthy diet and exercise.  Labs drawn today.   Had issues filling Repatha due to insurance Has got it situated with insurance to cover it and will start taking again this week having missed the last week Will adjust medication as needed depending on labs Lab Results  Component Value Date   LDLCALC 108 (H) 06/15/2023
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
## 2023-09-14 LAB — HEMOGLOBIN A1C
Est. average glucose Bld gHb Est-mCnc: 131 mg/dL
Hgb A1c MFr Bld: 6.2 % — ABNORMAL HIGH (ref 4.8–5.6)

## 2023-09-14 LAB — COMPREHENSIVE METABOLIC PANEL
ALT: 23 [IU]/L (ref 0–32)
AST: 25 [IU]/L (ref 0–40)
Albumin: 4.5 g/dL (ref 3.8–4.9)
Alkaline Phosphatase: 85 [IU]/L (ref 44–121)
BUN/Creatinine Ratio: 16 (ref 9–23)
BUN: 14 mg/dL (ref 6–24)
Bilirubin Total: 0.3 mg/dL (ref 0.0–1.2)
CO2: 28 mmol/L (ref 20–29)
Calcium: 10 mg/dL (ref 8.7–10.2)
Chloride: 94 mmol/L — ABNORMAL LOW (ref 96–106)
Creatinine, Ser: 0.86 mg/dL (ref 0.57–1.00)
Globulin, Total: 2.8 g/dL (ref 1.5–4.5)
Glucose: 98 mg/dL (ref 70–99)
Potassium: 4.6 mmol/L (ref 3.5–5.2)
Sodium: 137 mmol/L (ref 134–144)
Total Protein: 7.3 g/dL (ref 6.0–8.5)
eGFR: 79 mL/min/{1.73_m2} (ref >59)

## 2023-09-14 LAB — CBC WITH DIFFERENTIAL/PLATELET
Basophils Absolute: 0.1 x10E3/uL (ref 0.0–0.2)
Basos: 1 %
EOS (ABSOLUTE): 0.1 x10E3/uL (ref 0.0–0.4)
Eos: 1 %
Hematocrit: 46.7 % — ABNORMAL HIGH (ref 34.0–46.6)
Hemoglobin: 15.1 g/dL (ref 11.1–15.9)
Immature Grans (Abs): 0 x10E3/uL (ref 0.0–0.1)
Immature Granulocytes: 0 %
Lymphocytes Absolute: 1.5 x10E3/uL (ref 0.7–3.1)
Lymphs: 22 %
MCH: 28.5 pg (ref 26.6–33.0)
MCHC: 32.3 g/dL (ref 31.5–35.7)
MCV: 88 fL (ref 79–97)
Monocytes Absolute: 0.8 x10E3/uL (ref 0.1–0.9)
Monocytes: 11 %
Neutrophils Absolute: 4.6 x10E3/uL (ref 1.4–7.0)
Neutrophils: 65 %
Platelets: 339 x10E3/uL (ref 150–450)
RBC: 5.3 x10E6/uL — ABNORMAL HIGH (ref 3.77–5.28)
RDW: 13.4 % (ref 11.7–15.4)
WBC: 7 x10E3/uL (ref 3.4–10.8)

## 2023-09-14 LAB — LIPID PANEL
Chol/HDL Ratio: 3.9 ratio (ref 0.0–4.4)
Cholesterol, Total: 278 mg/dL — ABNORMAL HIGH (ref 100–199)
HDL: 72 mg/dL (ref >39)
LDL Chol Calc (NIH): 173 mg/dL — ABNORMAL HIGH (ref 0–99)
Triglycerides: 183 mg/dL — ABNORMAL HIGH (ref 0–149)
VLDL Cholesterol Cal: 33 mg/dL (ref 5–40)

## 2023-09-14 LAB — TSH: TSH: 3.16 u[IU]/mL (ref 0.450–4.500)

## 2023-09-14 LAB — T4, FREE: Free T4: 1.45 ng/dL (ref 0.82–1.77)

## 2023-09-15 ENCOUNTER — Encounter: Payer: Self-pay | Admitting: Allergy and Immunology

## 2023-09-16 ENCOUNTER — Encounter
Payer: Medicare Other | Attending: Physical Medicine and Rehabilitation | Admitting: Physical Medicine and Rehabilitation

## 2023-09-16 ENCOUNTER — Encounter: Payer: Self-pay | Admitting: Physical Medicine and Rehabilitation

## 2023-09-16 ENCOUNTER — Encounter: Payer: Self-pay | Admitting: Physician Assistant

## 2023-09-16 VITALS — BP 148/81 | HR 77 | Wt 190.0 lb

## 2023-09-16 DIAGNOSIS — G5701 Lesion of sciatic nerve, right lower limb: Secondary | ICD-10-CM | POA: Diagnosis not present

## 2023-09-16 DIAGNOSIS — G894 Chronic pain syndrome: Secondary | ICD-10-CM | POA: Insufficient documentation

## 2023-09-16 MED ORDER — HYDROMORPHONE HCL 2 MG PO TABS: 2.0000 mg | ORAL_TABLET | Freq: Four times a day (QID) | ORAL | 0 refills | Status: DC | PRN

## 2023-09-16 NOTE — Patient Instructions (Signed)
Pt is a 57 yr old female with hx of HTN, moderate persistent asthma; hypothyroidism- latest TSH 1.41, fibromyalgia, Migraines, and BMI 31; Told she has Kidney disease? Thinks due to swelling up so much- -  Latest BUN/Cr 22/0.77.  Has many allergies 30 meds- also has IBS Here for evaluation for pain.  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  Also has trochanteric bursitis.  F/U on chronic pain syndrome.    Try to do diary of RUQ pain- to see if related to anything, or meds.   2.  Don't think RUQ pain was due to thoracic back pain- I think it's due to something abdominal- has a bunch of abdominal surgeries- could have adhesions.  Has pain periods of pain- really bad, and then calms down-    3. Just got refill of Dilaudid- but needs to fax it to me- for prior authorization- sent in new refill of Dilaudid- 10/25- but needed to call every week to get 7 days of meds- will send in new Rx- and then they need to send me prior auth so I can do it for pt.     4. F/U in 3 months- - since doesn't get Dilaudid very often-
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
## 2023-09-17 LAB — LEUKOTRIENE E4, 24 HR, U
Collection Duration: 24 h
Creatinine Concentration,24 HR: 36 mg/dL
Creatinine, 24 HR, U: 1620 mg/(24.h) (ref 603–1783)
Leukotriene E4, U: 56 pg/mg{creat} (ref <=104)
Urine Volume: 4500 mL

## 2023-09-19 ENCOUNTER — Other Ambulatory Visit: Payer: Self-pay | Admitting: Physician Assistant

## 2023-09-19 ENCOUNTER — Encounter: Payer: Self-pay | Admitting: Physician Assistant

## 2023-09-19 DIAGNOSIS — J018 Other acute sinusitis: Secondary | ICD-10-CM

## 2023-09-19 MED ORDER — CEFDINIR 300 MG PO CAPS: 300.0000 mg | ORAL_CAPSULE | Freq: Two times a day (BID) | ORAL | 0 refills | Status: DC

## 2023-09-20 LAB — N-METHYLHISTAMINE, 24 HR, U
Collection Duration: 24 h
Creatinine Concent. 24 Hr, U: 36 mg/dL
Creatinine, 24 Hour, U: 1620 mg/(24.h) (ref 603–1783)
N-Methylhistamine, 24 Hr, U: 64 ug/g{creat} (ref 30–200)
Urine Volume: 4500 mL

## 2023-09-20 LAB — PROSTAGLANDIN D2/CREATININE, U
Creatinine, Urine: 23 mg/dL
Prostaglandin D2, urine: 5.5 pg/mL
Prostaglandin D2/Cr Ratio: 24 ng/g

## 2023-09-20 LAB — KIT (D816V) DIGITAL PCR: CKIT Result: NEGATIVE

## 2023-09-20 LAB — TRYPTASE: Tryptase: 8.9 ug/L (ref 2.2–13.2)

## 2023-09-21 NOTE — Telephone Encounter (Signed)
Called patient and she was notified °

## 2023-09-23 ENCOUNTER — Other Ambulatory Visit: Payer: Self-pay | Admitting: Family Medicine

## 2023-09-23 DIAGNOSIS — E039 Hypothyroidism, unspecified: Secondary | ICD-10-CM

## 2023-09-27 ENCOUNTER — Ambulatory Visit: Payer: Medicare Other | Admitting: Family Medicine

## 2023-09-30 ENCOUNTER — Other Ambulatory Visit: Payer: Self-pay | Admitting: Cardiovascular Disease

## 2023-10-17 ENCOUNTER — Ambulatory Visit (INDEPENDENT_AMBULATORY_CARE_PROVIDER_SITE_OTHER): Payer: Medicare Other

## 2023-10-17 VITALS — BP 150/86 | HR 103 | Temp 97.8°F | Ht 64.0 in | Wt 197.0 lb

## 2023-10-17 DIAGNOSIS — J4541 Moderate persistent asthma with (acute) exacerbation: Secondary | ICD-10-CM

## 2023-10-17 DIAGNOSIS — R053 Chronic cough: Secondary | ICD-10-CM | POA: Diagnosis not present

## 2023-10-17 MED ORDER — PROMETHAZINE-DM 6.25-15 MG/5ML PO SYRP: 5.0000 mL | ORAL_SOLUTION | Freq: Three times a day (TID) | ORAL | 0 refills | Status: AC | PRN

## 2023-10-17 MED ORDER — TRIAMCINOLONE ACETONIDE 40 MG/ML IJ SUSP
80.0000 mg | Freq: Once | INTRAMUSCULAR | Status: AC
  Administered 2023-10-17: 80 mg via INTRAMUSCULAR
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
## 2023-10-17 NOTE — Assessment & Plan Note (Signed)
Severe asthma exacerbation since Thanksgiving, triggered by exposure to candles. Persistent symptoms despite frequent use of rescue inhalers and nebulizer. Unable to take prednisone but can take methylprednisolone and is positive that she never had a reaction with Triamcinolone. No signs of pneumonia on examination. Oxygen saturation at 98%. Discussed risks and benefits of TRIAMCINOLONE injection, including faster and better action compared to oral steroids. Patient prefers injection due to inability to take prednisone. Promethazine with dextromethorphan prescribed for cough suppression, with caution advised due to potential side effects.   - Administer TRIAMCINOLONE IM injection  80 mg today - Prescribe promethazine with dextromethorphan for cough, to be taken three times daily as needed, preferably at bedtime   - Advise to seek emergency care if severe symptoms occur or if breathing does not improve    Musculoskeletal Pain   Severe localized pain in the mid-upper back, likely due to coughing. No rash or other signs of infection. History of fibromyalgia. Discussed non-pharmacological approaches such as heat and ice application for relief.   - Apply heat and ice to the affected area    Hypertension   Elevated blood pressure noted during the visit. Patient reports feeling that her blood pressure is high.   - Monitor blood pressure regularly   - Advise to follow up with PCP for blood pressure management    Chronic Allergic Rhinitis   Chronic runny nose, managed with high doses of Zyrtec due to DMAP. No new intervention required at this time.   - Continue current Zyrtec regimen.

## 2023-10-17 NOTE — Patient Instructions (Signed)
VISIT SUMMARY:  During today's visit, we addressed your ongoing asthma issues, severe mid-upper back pain, high blood pressure, and chronic allergic rhinitis. You reported a severe asthma attack on Thanksgiving and have been struggling to control your symptoms since then. We discussed your treatment options and made adjustments to help manage your conditions more effectively.  YOUR PLAN:  -ASTHMA EXACERBATION: An asthma exacerbation is a worsening of asthma symptoms, often triggered by allergens or irritants. Since your symptoms have been severe and persistent, we administered a TRIAMCINOLONE injection to help reduce inflammation and prescribed promethazine with dextromethorphan to suppress your cough. Please take the cough medicine three times daily as needed, preferably at bedtime. Seek emergency care if your symptoms worsen or if your breathing does not improve.  -MUSCULOSKELETAL PAIN: Musculoskeletal pain refers to pain affecting the muscles, ligaments, and tendons. Your mid-upper back pain is likely due to coughing. We recommend applying heat and ice to the affected area to help relieve the pain.  -HYPERTENSION: Hypertension is high blood pressure, which can lead to serious health issues if not managed properly. We noted elevated blood pressure during your visit. Please monitor your blood pressure regularly and follow up with your primary care provider for ongoing management.  -CHRONIC ALLERGIC RHINITIS: Chronic allergic rhinitis is a condition where the inside of your nose becomes inflamed due to allergens, causing a runny nose. You are currently managing this with high doses of Zyrtec due to DMAP, and no new interventions are needed at this time. Continue with your current Zyrtec regimen.  INSTRUCTIONS:  Please monitor your blood pressure regularly and follow up with your primary care provider for blood pressure management. Seek emergency care if your asthma symptoms worsen or if your  breathing does not improve.

## 2023-11-05 ENCOUNTER — Other Ambulatory Visit: Payer: Self-pay | Admitting: Physician Assistant

## 2023-11-15 ENCOUNTER — Encounter: Payer: Self-pay | Admitting: Family Medicine

## 2023-11-15 ENCOUNTER — Other Ambulatory Visit: Payer: Self-pay | Admitting: Family Medicine

## 2023-11-15 DIAGNOSIS — I10 Essential (primary) hypertension: Secondary | ICD-10-CM

## 2023-11-15 MED ORDER — POTASSIUM CHLORIDE CRYS ER 20 MEQ PO TBCR: 40.0000 meq | EXTENDED_RELEASE_TABLET | Freq: Three times a day (TID) | ORAL | 1 refills | Status: DC

## 2023-11-19 ENCOUNTER — Other Ambulatory Visit: Payer: Self-pay | Admitting: Family Medicine

## 2023-12-09 ENCOUNTER — Ambulatory Visit: Payer: Medicare Other | Admitting: Physical Medicine and Rehabilitation

## 2023-12-13 ENCOUNTER — Encounter: Payer: Self-pay | Admitting: Allergy and Immunology

## 2023-12-14 ENCOUNTER — Other Ambulatory Visit: Payer: Self-pay

## 2023-12-14 ENCOUNTER — Encounter: Payer: Self-pay | Admitting: Family Medicine

## 2023-12-14 DIAGNOSIS — E039 Hypothyroidism, unspecified: Secondary | ICD-10-CM

## 2023-12-14 DIAGNOSIS — K2270 Barrett's esophagus without dysplasia: Secondary | ICD-10-CM

## 2023-12-14 MED ORDER — PANTOPRAZOLE SODIUM 40 MG PO TBEC
40.0000 mg | DELAYED_RELEASE_TABLET | Freq: Every day | ORAL | 2 refills | Status: DC
Start: 2023-12-14 — End: 2024-04-25

## 2023-12-14 MED ORDER — OPZELURA 1.5 % EX CREA: TOPICAL_CREAM | CUTANEOUS | 5 refills | Status: DC

## 2023-12-16 ENCOUNTER — Ambulatory Visit: Payer: Medicare Other | Admitting: Physical Medicine and Rehabilitation

## 2023-12-28 ENCOUNTER — Other Ambulatory Visit: Payer: Self-pay | Admitting: Physician Assistant

## 2023-12-28 ENCOUNTER — Other Ambulatory Visit: Payer: Self-pay

## 2023-12-28 ENCOUNTER — Other Ambulatory Visit: Payer: Self-pay | Admitting: Cardiovascular Disease

## 2024-01-10 ENCOUNTER — Encounter: Payer: Self-pay | Admitting: Physician Assistant

## 2024-01-10 ENCOUNTER — Ambulatory Visit (INDEPENDENT_AMBULATORY_CARE_PROVIDER_SITE_OTHER): Admitting: Physician Assistant

## 2024-01-10 VITALS — BP 130/72 | HR 68 | Temp 97.6°F | Ht 64.0 in | Wt 191.0 lb

## 2024-01-10 DIAGNOSIS — D8940 Mast cell activation, unspecified: Secondary | ICD-10-CM | POA: Diagnosis not present

## 2024-01-10 DIAGNOSIS — J4541 Moderate persistent asthma with (acute) exacerbation: Secondary | ICD-10-CM

## 2024-01-10 DIAGNOSIS — G8929 Other chronic pain: Secondary | ICD-10-CM

## 2024-01-10 DIAGNOSIS — E782 Mixed hyperlipidemia: Secondary | ICD-10-CM | POA: Diagnosis not present

## 2024-01-10 DIAGNOSIS — R5381 Other malaise: Secondary | ICD-10-CM | POA: Diagnosis not present

## 2024-01-10 DIAGNOSIS — M25561 Pain in right knee: Secondary | ICD-10-CM | POA: Diagnosis not present

## 2024-01-10 DIAGNOSIS — R5383 Other fatigue: Secondary | ICD-10-CM | POA: Diagnosis not present

## 2024-01-10 DIAGNOSIS — I10 Essential (primary) hypertension: Secondary | ICD-10-CM | POA: Diagnosis not present

## 2024-01-10 DIAGNOSIS — R1010 Upper abdominal pain, unspecified: Secondary | ICD-10-CM | POA: Diagnosis not present

## 2024-01-10 DIAGNOSIS — M797 Fibromyalgia: Secondary | ICD-10-CM

## 2024-01-10 MED ORDER — TRIAMCINOLONE ACETONIDE 40 MG/ML IJ SUSP
80.0000 mg | Freq: Once | INTRAMUSCULAR | Status: AC
  Administered 2024-01-10: 80 mg via INTRAMUSCULAR

## 2024-01-10 MED ORDER — METHYLPREDNISOLONE 4 MG PO TBPK: ORAL_TABLET | ORAL | 1 refills | Status: DC

## 2024-01-10 MED ORDER — METHYLPREDNISOLONE ACETATE 80 MG/ML IJ SUSP: 80.0000 mg | Freq: Once | INTRAMUSCULAR | Status: DC

## 2024-01-10 MED ORDER — DUPIXENT 200 MG/1.14ML ~~LOC~~ SOAJ: 200.0000 mg | SUBCUTANEOUS | 3 refills | Status: DC

## 2024-01-10 NOTE — Assessment & Plan Note (Signed)
 Labs drawn today Continue to monitor symptoms Will adjust treatment depending on labs

## 2024-01-10 NOTE — Assessment & Plan Note (Signed)
 Managed with multiple antihypertensive medications including diuretics. Difficulty maintaining stable blood pressure, with episodes of elevated readings, particularly in the evening. Concern about impact of weight gain and chronic pain on blood pressure control. Discussed need to review and possibly adjust antihypertensive regimen to better manage blood pressure fluctuations. - Review current antihypertensive regimen and adjust timing or dosage as needed. - Monitor blood pressure regularly, especially in the evening. - Consider lifestyle modifications to aid in weight management.
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
## 2024-01-10 NOTE — Assessment & Plan Note (Signed)
 Severe knee pain impacting ability to work and perform daily activities. Considering knee replacement surgery. Concern about post-operative care due to living alone and having a pet. Discussed potential benefits and challenges of knee replacement surgery, including need for a support system post-operatively. - Discuss potential knee replacement surgery with orthopedic specialist. - Consider physical therapy to manage pain and improve function. - Evaluate support system for post-operative care.

## 2024-01-10 NOTE — Assessment & Plan Note (Signed)
 Well controlled.  Continue to work on eating a healthy diet and exercise.  Labs drawn today.   Has got it situated with insurance to cover it and will start taking again this week having missed the last week Will adjust medication as needed depending on labs Lab Results  Component Value Date   LDLCALC 173 (H) 09/13/2023

## 2024-01-10 NOTE — Assessment & Plan Note (Signed)
 Severe upper right quadrant abdominal pain radiating to the back, associated with nausea. Previous gallbladder evaluations inconclusive. Differential diagnosis includes pancreatitis, given pain nature and exacerbation when lying down. Discussed possibility of pancreatitis and need for further imaging and blood tests to evaluate liver, kidney, and pancreatic function. - Order CT scan of the abdomen and pelvis to evaluate for pancreatitis or other abdominal pathology. - Conduct blood tests to assess liver, kidney, and pancreatic function.

## 2024-01-10 NOTE — Assessment & Plan Note (Signed)
 causing mast cell activation leading to hives, itching, and systemic symptoms. Managed with high doses of antihistamines and prescribed Opzelura cream for rash management, not covered by insurance. Significant side effects from antihistamines, including excessive thirst and potential electrolyte imbalance. Discussed Dupixent as potential treatment to stabilize mast cells, with understanding of potential side effects and cost considerations. - Prescribe Dupixent to stabilize mast cells and reduce symptoms. - Monitor for potential side effects of Dupixent. - Consult with immunologist about long-term management options.

## 2024-01-10 NOTE — Assessment & Plan Note (Signed)
 Chronic condition with persistent pain, fatigue, and dizziness, recently exacerbated. Severe fatigue, inability to perform daily activities, and widespread joint pain reported. Concern about low potassium exacerbating symptoms. Experiencing sleep disturbances and emotional distress due to chronic pain. Discussed potential use of steroids for managing inflammation and pain, considering risks such as weight gain and fluid retention. - Administer steroid injection to manage widespread inflammation and pain. - Consult with immunologist about potential low-dose steroid therapy for frequent flares. - Monitor potassium levels through blood work. - Consider referral to pain management specialist for comprehensive management.

## 2024-01-11 ENCOUNTER — Encounter: Payer: Self-pay | Admitting: Physician Assistant

## 2024-01-11 LAB — COMPREHENSIVE METABOLIC PANEL
ALT: 17 IU/L (ref 0–32)
AST: 23 IU/L (ref 0–40)
Albumin: 4.6 g/dL (ref 3.8–4.9)
Alkaline Phosphatase: 73 IU/L (ref 44–121)
BUN/Creatinine Ratio: 33 — ABNORMAL HIGH (ref 9–23)
BUN: 26 mg/dL — ABNORMAL HIGH (ref 6–24)
Bilirubin Total: 0.5 mg/dL (ref 0.0–1.2)
CO2: 27 mmol/L (ref 20–29)
Calcium: 9.8 mg/dL (ref 8.7–10.2)
Chloride: 95 mmol/L — ABNORMAL LOW (ref 96–106)
Creatinine, Ser: 0.8 mg/dL (ref 0.57–1.00)
Globulin, Total: 2.4 g/dL (ref 1.5–4.5)
Glucose: 96 mg/dL (ref 70–99)
Potassium: 3.4 mmol/L — ABNORMAL LOW (ref 3.5–5.2)
Sodium: 140 mmol/L (ref 134–144)
Total Protein: 7 g/dL (ref 6.0–8.5)
eGFR: 85 mL/min/{1.73_m2} (ref >59)

## 2024-01-11 LAB — LIPID PANEL
Chol/HDL Ratio: 4 ratio (ref 0.0–4.4)
Cholesterol, Total: 250 mg/dL — ABNORMAL HIGH (ref 100–199)
HDL: 63 mg/dL (ref >39)
LDL Chol Calc (NIH): 130 mg/dL — ABNORMAL HIGH (ref 0–99)
Triglycerides: 324 mg/dL — ABNORMAL HIGH (ref 0–149)
VLDL Cholesterol Cal: 57 mg/dL — ABNORMAL HIGH (ref 5–40)

## 2024-01-11 LAB — C-REACTIVE PROTEIN: CRP: 6 mg/L (ref 0–10)

## 2024-01-11 LAB — CBC WITH DIFFERENTIAL/PLATELET
Basophils Absolute: 0 10*3/uL (ref 0.0–0.2)
Basos: 1 %
EOS (ABSOLUTE): 0.2 10*3/uL (ref 0.0–0.4)
Eos: 2 %
Hematocrit: 43.9 % (ref 34.0–46.6)
Hemoglobin: 14.3 g/dL (ref 11.1–15.9)
Immature Grans (Abs): 0 10*3/uL (ref 0.0–0.1)
Immature Granulocytes: 0 %
Lymphocytes Absolute: 1.7 10*3/uL (ref 0.7–3.1)
Lymphs: 24 %
MCH: 28.1 pg (ref 26.6–33.0)
MCHC: 32.6 g/dL (ref 31.5–35.7)
MCV: 86 fL (ref 79–97)
Monocytes Absolute: 0.7 10*3/uL (ref 0.1–0.9)
Monocytes: 9 %
Neutrophils Absolute: 4.5 10*3/uL (ref 1.4–7.0)
Neutrophils: 64 %
Platelets: 356 10*3/uL (ref 150–450)
RBC: 5.09 x10E6/uL (ref 3.77–5.28)
RDW: 13.7 % (ref 11.7–15.4)
WBC: 7.1 10*3/uL (ref 3.4–10.8)

## 2024-01-11 LAB — HEMOGLOBIN A1C
Est. average glucose Bld gHb Est-mCnc: 134 mg/dL
Hgb A1c MFr Bld: 6.3 % — ABNORMAL HIGH (ref 4.8–5.6)

## 2024-01-11 LAB — SEDIMENTATION RATE: Sed Rate: 23 mm/h (ref 0–40)

## 2024-01-11 LAB — AMYLASE: Amylase: 72 U/L (ref 31–110)

## 2024-01-11 LAB — LIPASE: Lipase: 33 U/L (ref 14–72)

## 2024-01-12 ENCOUNTER — Ambulatory Visit (INDEPENDENT_AMBULATORY_CARE_PROVIDER_SITE_OTHER)
Admission: RE | Admit: 2024-01-12 | Discharge: 2024-01-12 | Disposition: A | Discharge status: Home / Self Care | Source: Ambulatory Visit | Attending: Physician Assistant | Admitting: Physician Assistant

## 2024-01-12 DIAGNOSIS — R1010 Upper abdominal pain, unspecified: Secondary | ICD-10-CM

## 2024-01-12 DIAGNOSIS — R1011 Right upper quadrant pain: Secondary | ICD-10-CM | POA: Diagnosis not present

## 2024-01-12 MED ORDER — IOHEXOL 300 MG/ML  SOLN
100.0000 mL | Freq: Once | INTRAMUSCULAR | Status: AC | PRN
  Administered 2024-01-12: 100 mL via INTRAVENOUS

## 2024-01-13 ENCOUNTER — Encounter: Payer: Self-pay | Admitting: Physician Assistant

## 2024-01-13 ENCOUNTER — Other Ambulatory Visit: Payer: Self-pay | Admitting: Physician Assistant

## 2024-01-13 DIAGNOSIS — J4541 Moderate persistent asthma with (acute) exacerbation: Secondary | ICD-10-CM

## 2024-01-13 DIAGNOSIS — D8940 Mast cell activation, unspecified: Secondary | ICD-10-CM

## 2024-01-13 MED ORDER — DUPIXENT 200 MG/1.14ML ~~LOC~~ SOAJ: 200.0000 mg | SUBCUTANEOUS | 3 refills | Status: DC

## 2024-01-16 ENCOUNTER — Other Ambulatory Visit: Payer: Self-pay

## 2024-01-16 DIAGNOSIS — D8940 Mast cell activation, unspecified: Secondary | ICD-10-CM

## 2024-01-16 DIAGNOSIS — J4541 Moderate persistent asthma with (acute) exacerbation: Secondary | ICD-10-CM

## 2024-01-16 MED ORDER — DUPIXENT 200 MG/1.14ML ~~LOC~~ SOAJ: 200.0000 mg | SUBCUTANEOUS | 3 refills | Status: DC

## 2024-01-25 ENCOUNTER — Other Ambulatory Visit: Payer: Self-pay

## 2024-01-25 ENCOUNTER — Encounter: Payer: Self-pay | Admitting: Physician Assistant

## 2024-01-25 ENCOUNTER — Other Ambulatory Visit: Payer: Self-pay | Admitting: Physician Assistant

## 2024-01-25 DIAGNOSIS — J4520 Mild intermittent asthma, uncomplicated: Secondary | ICD-10-CM

## 2024-01-25 DIAGNOSIS — J4541 Moderate persistent asthma with (acute) exacerbation: Secondary | ICD-10-CM

## 2024-01-25 DIAGNOSIS — D8940 Mast cell activation, unspecified: Secondary | ICD-10-CM

## 2024-01-25 MED ORDER — DUPIXENT 100 MG/0.67ML ~~LOC~~ SOSY: 1.0000 | PREFILLED_SYRINGE | SUBCUTANEOUS | 1 refills | Status: DC

## 2024-01-26 ENCOUNTER — Encounter: Payer: Self-pay | Admitting: Physician Assistant

## 2024-01-26 ENCOUNTER — Ambulatory Visit: Payer: Self-pay

## 2024-01-26 NOTE — Telephone Encounter (Signed)
 Please review and clarify.   Copied from CRM [id number]. Topic: Clinical - Medication Question >> Jan 26, 2024  4:52 PM Alessandra Bevels wrote: Reason for CRM: Marcelle Smiling calling from [facility] calling for clairity Dupilumab (DUPIXENT) 100 MG/0.67ML SOSY [[id number]] Received 2 scripts 100mg  and 200mg  would like to know which script is valid. As the 100mg  is unavailable.  CB-Dupilumab (DUPIXENT) 100 MG/0.67ML SOSY [[id number]] [phone] Ref- [id number] Reason for Disposition . [1] Pharmacy calling with prescription question AND [2] triager unable to answer question  Protocols used: Medication Question Call-A-AH

## 2024-01-26 NOTE — Telephone Encounter (Unsigned)
 Copied from CRM [id number]. Topic: Clinical - Medication Question >> Jan 26, 2024  4:52 PM Alessandra Bevels wrote: Reason for CRM: Marcelle Smiling calling from [facility] calling for clairity Dupilumab (DUPIXENT) 100 MG/0.67ML SOSY [[id number]] Received 2 scripts 100mg  and 200mg  would like to know which script is valid. As the 100mg  is unavailable.  CB-Dupilumab (DUPIXENT) 100 MG/0.67ML SOSY [[id number]] [phone] Ref- [id number]

## 2024-01-27 NOTE — Telephone Encounter (Signed)
 This has already been addressed via fax to the pharmacy.

## 2024-01-30 NOTE — Progress Notes (Unsigned)
 Laura [name] D.[facility] [address] [place] [address] Phone: [phone]   Assessment and Plan:     There are no diagnoses linked to this encounter.  ***   Pertinent previous records reviewed include ***    Follow Up: ***     Subjective:   I,  , am serving as a Neurosurgeon for Doctor Richardean Sale  Chief Complaint: back pain   HPI:   01/31/2024 Patient is a 58 year old female with back pain. Patient states   Relevant Historical Information: ***  Additional pertinent review of systems negative.   Current Outpatient Medications:    albuterol (VENTOLIN HFA) 108 (90 Base) MCG/ACT inhaler, Inhale 2 puffs into the lungs every 4 (four) hours as needed for wheezing or shortness of breath., Disp: 18 g, Rfl: 3   ALPRAZolam (XANAX) 1 MG tablet, Take 1 mg by mouth 4 (four) times daily as needed for anxiety., Disp: , Rfl:    Blood Glucose Monitoring Suppl (ACCU-CHEK GUIDE) w/Device KIT, 1 each by Does not apply route daily., Disp: 1 kit, Rfl: 0   captopril (CAPOTEN) 25 MG tablet, TAKE 1 TABLET BY MOUTH 3 TIMES DAILY, Disp: 270 tablet, Rfl: 0   carvedilol (COREG) 6.25 MG tablet, Take 1 tablet (6.25 mg total) by mouth 2 (two) times daily. PLEASE CALL [phone] TO SCHEDULE A 6 MONTH FOLLOW UP VISIT PRIOR TO NEXT REFILL REQUEST. THANK YOU., Disp: 180 tablet, Rfl: 0   chlorthalidone (HYGROTON) 25 MG tablet, TAKE ONE TABLET BY MOUTH EVERY DAY, Disp: 90 tablet, Rfl: 0   Dupilumab (DUPIXENT) 100 MG/0.67ML SOSY, Inject 1 Pen into the skin every 14 (fourteen) days., Disp: 1.34 mL, Rfl: 1   EPINEPHrine 0.3 mg/0.3 mL IJ SOAJ injection, Inject 0.3 mLs (0.3 mg total) into the muscle as needed for anaphylaxis., Disp: 1 each, Rfl: 2   Evolocumab (REPATHA SURECLICK) 140 MG/ML SOAJ, Inject 140 mg into the skin every 14 (fourteen) days., Disp: , Rfl:    furosemide (LASIX) 20 MG tablet, TAKE ONE TABLET BY MOUTH TWICE DAILY, Disp: 180 tablet, Rfl:  1   glucose blood (ACCU-CHEK GUIDE) test strip, 1 each by Other route daily in the afternoon. Use as instructed, Disp: 100 each, Rfl: 12   HYDROmorphone (DILAUDID) 2 MG tablet, Take 1 tablet (2 mg total) by mouth every 6 (six) hours as needed for moderate pain (pain score 4-6) (pains score 4-6)., Disp: 60 tablet, Rfl: 0   ipratropium-albuterol (DUONEB) 0.5-2.5 (3) MG/3ML SOLN, Take 3 mLs by nebulization every 4 (four) hours as needed., Disp: 360 mL, Rfl: 2   levothyroxine (SYNTHROID) 112 MCG tablet, TAKE ONE TABLET BY MOUTH EVERY DAY, Disp: 90 tablet, Rfl: 0   methylPREDNISolone (MEDROL DOSEPAK) 4 MG TBPK tablet, 6 tabs on day 1, 5 tabs on day 2, 4 tabs on day 3, 3 tabs on day 4, 2 tabs on day 5,  1 tab on day 6., Disp: 21 tablet, Rfl: 1   nystatin powder, Apply 1 Application topically 2 (two) times daily., Disp: , Rfl:    pantoprazole (PROTONIX) 40 MG tablet, Take 1 tablet (40 mg total) by mouth daily. Taking three times weekly, Disp: 90 tablet, Rfl: 2   potassium chloride SA (KLOR-CON M) 20 MEQ tablet, Take 2 tablets (40 mEq total) by mouth 3 (three) times daily., Disp: 540 tablet, Rfl: 1   promethazine (PHENERGAN) 25 MG tablet, Take 1 tablet (25 mg total) by mouth every 8 (eight) hours as  needed for nausea or vomiting., Disp: 30 tablet, Rfl: 0   REPATHA 140 MG/ML SOSY, INJECT 1 SYRINGE SUBCUTANEOUSLY  EVERY 2 WEEKS, Disp: 7 mL, Rfl: 2   Ruxolitinib Phosphate (OPZELURA) 1.5 % CREA, Apply sparingly to affected areas twice daily if needed, Disp: 60 g, Rfl: 5   tiZANidine (ZANAFLEX) 4 MG tablet, Take 2 tablets (8 mg total) by mouth 2 (two) times daily. For muscle spasms, Disp: 120 tablet, Rfl: 5   Ubrogepant (UBRELVY) 100 MG TABS, Take 1 tablet (100 mg total) by mouth 2 (two) times daily as needed (Take one and if symptoms persist after 2 hours take a second one. Do not take more than 2 in 24 hours)., Disp: 30 tablet, Rfl: 2   zolpidem (AMBIEN) 10 MG tablet, Take 10 mg by mouth at bedtime., Disp: , Rfl:     Objective:     There were no vitals filed for this visit.    There is no height or weight on file to calculate BMI.    Physical Exam:    ***   Electronically signed by:  Laura [name] D.[facility] 7:32 AM 01/30/24

## 2024-01-31 ENCOUNTER — Ambulatory Visit: Admitting: Sports Medicine

## 2024-01-31 ENCOUNTER — Other Ambulatory Visit: Payer: Self-pay

## 2024-01-31 ENCOUNTER — Ambulatory Visit (INDEPENDENT_AMBULATORY_CARE_PROVIDER_SITE_OTHER)

## 2024-01-31 VITALS — HR 56 | Ht 64.0 in | Wt 192.0 lb

## 2024-01-31 DIAGNOSIS — M542 Cervicalgia: Secondary | ICD-10-CM

## 2024-01-31 DIAGNOSIS — M797 Fibromyalgia: Secondary | ICD-10-CM | POA: Diagnosis not present

## 2024-01-31 DIAGNOSIS — M533 Sacrococcygeal disorders, not elsewhere classified: Secondary | ICD-10-CM

## 2024-01-31 DIAGNOSIS — M419 Scoliosis, unspecified: Secondary | ICD-10-CM | POA: Diagnosis not present

## 2024-01-31 DIAGNOSIS — M47814 Spondylosis without myelopathy or radiculopathy, thoracic region: Secondary | ICD-10-CM | POA: Diagnosis not present

## 2024-01-31 DIAGNOSIS — M160 Bilateral primary osteoarthritis of hip: Secondary | ICD-10-CM | POA: Diagnosis not present

## 2024-01-31 DIAGNOSIS — M545 Low back pain, unspecified: Secondary | ICD-10-CM | POA: Diagnosis not present

## 2024-01-31 DIAGNOSIS — G894 Chronic pain syndrome: Secondary | ICD-10-CM

## 2024-01-31 DIAGNOSIS — M7918 Myalgia, other site: Secondary | ICD-10-CM

## 2024-01-31 DIAGNOSIS — G8929 Other chronic pain: Secondary | ICD-10-CM

## 2024-01-31 DIAGNOSIS — M549 Dorsalgia, unspecified: Secondary | ICD-10-CM

## 2024-01-31 DIAGNOSIS — M47817 Spondylosis without myelopathy or radiculopathy, lumbosacral region: Secondary | ICD-10-CM | POA: Diagnosis not present

## 2024-01-31 DIAGNOSIS — M47816 Spondylosis without myelopathy or radiculopathy, lumbar region: Secondary | ICD-10-CM | POA: Diagnosis not present

## 2024-01-31 NOTE — Patient Instructions (Addendum)
 PT referral  Low back HEP  Work note provided  4 week follow up

## 2024-02-02 ENCOUNTER — Encounter: Payer: Self-pay | Admitting: Sports Medicine

## 2024-02-03 ENCOUNTER — Telehealth: Payer: Self-pay

## 2024-02-03 NOTE — Progress Notes (Signed)
   02/03/2024  Patient ID: Laura [name], female   DOB: 12/07/1965, 58 y.o.   MRN: [id number]  Attempted to reach patient to schedule referral per request of Huston Foley to discuss asthma medications including Dupixent, and see if there is any other way I can provide medication assistance. My direct line is listed below should patient return call.   Dahlia Byes, PharmD, BCGP Clinical Pharmacist  [phone]

## 2024-02-06 ENCOUNTER — Encounter: Payer: Self-pay | Admitting: Sports Medicine

## 2024-02-08 ENCOUNTER — Encounter: Payer: Self-pay | Admitting: Physical Medicine and Rehabilitation

## 2024-02-08 ENCOUNTER — Encounter
Payer: Medicare Other | Attending: Physical Medicine and Rehabilitation | Admitting: Physical Medicine and Rehabilitation

## 2024-02-08 VITALS — BP 152/95 | HR 74 | Ht 64.0 in

## 2024-02-08 DIAGNOSIS — M533 Sacrococcygeal disorders, not elsewhere classified: Secondary | ICD-10-CM | POA: Diagnosis not present

## 2024-02-08 DIAGNOSIS — Z5181 Encounter for therapeutic drug level monitoring: Secondary | ICD-10-CM | POA: Diagnosis not present

## 2024-02-08 DIAGNOSIS — G894 Chronic pain syndrome: Secondary | ICD-10-CM | POA: Insufficient documentation

## 2024-02-08 DIAGNOSIS — G8929 Other chronic pain: Secondary | ICD-10-CM | POA: Diagnosis not present

## 2024-02-08 DIAGNOSIS — R1011 Right upper quadrant pain: Secondary | ICD-10-CM | POA: Diagnosis not present

## 2024-02-08 MED ORDER — HYDROMORPHONE HCL 2 MG PO TABS: 2.0000 mg | ORAL_TABLET | Freq: Three times a day (TID) | ORAL | 0 refills | Status: DC | PRN

## 2024-02-08 NOTE — Patient Instructions (Signed)
 Pt is a 58 yr old female with hx of HTN, moderate persistent asthma; hypothyroidism- latest TSH 1.41, fibromyalgia, Migraines, and BMI 31; Told she has Kidney disease? Thinks due to swelling up so much- -  Latest BUN/Cr 22/0.77.  Has many allergies 30 meds- also has IBS Here for evaluation for pain.  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  Also has trochanteric bursitis.  F/U on chronic pain syndrome.    Needs to do some chair yoga- start with this- and work on improving Range of motion/and spasms. Youtube has videos to help as well- start with chair yoga and then can progress to regular yoga.    2.  Urine drug screen- per clinic policy- is due.    3. Con't Dilaudid 2 mg as needed-  will increase to 75 pills for now.   4. We discussed when to get knee replaced doesn't have anyone to help- and has dog that's 70 years old- waiting til she goes- poodle mix-   5.  BP 152/95 today- sees Cardiology next week to reassess.    6. Needs to do diary of Right upper quadrant pain-  hasn't been able to figure out if specific foods setting it off.  Suspicious that KCL causing her to hurt- takes 3x/day?- I haven't seen this, but will need to document.    7. Dr Jean Rosenthal hasn't done adjustments/OMT on her-it might be worth discussing with Dr Jean Rosenthal   8. Has copay for PT- $20- and cannot afford it.  Suggest learning exercises and then just get started- and then occ intermittently- Has free membership to [facility].     9.  F/U in 3 months- F/U on chronic pain
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
## 2024-02-10 ENCOUNTER — Other Ambulatory Visit: Payer: Self-pay

## 2024-02-10 ENCOUNTER — Encounter: Payer: Self-pay | Admitting: Physical Medicine and Rehabilitation

## 2024-02-10 ENCOUNTER — Other Ambulatory Visit: Payer: Self-pay | Admitting: Physician Assistant

## 2024-02-10 ENCOUNTER — Encounter: Payer: Self-pay | Admitting: Physician Assistant

## 2024-02-10 DIAGNOSIS — I10 Essential (primary) hypertension: Secondary | ICD-10-CM

## 2024-02-10 MED ORDER — FUROSEMIDE 20 MG PO TABS: 20.0000 mg | ORAL_TABLET | Freq: Two times a day (BID) | ORAL | 1 refills | Status: DC

## 2024-02-11 LAB — TOXASSURE SELECT,+ANTIDEPR,UR

## 2024-02-15 DIAGNOSIS — R2689 Other abnormalities of gait and mobility: Secondary | ICD-10-CM | POA: Diagnosis not present

## 2024-02-15 DIAGNOSIS — M545 Low back pain, unspecified: Secondary | ICD-10-CM | POA: Diagnosis not present

## 2024-02-16 ENCOUNTER — Ambulatory Visit: Admitting: Physician Assistant

## 2024-02-16 ENCOUNTER — Other Ambulatory Visit: Payer: Self-pay | Admitting: Physician Assistant

## 2024-02-16 ENCOUNTER — Encounter: Payer: Self-pay | Admitting: Physician Assistant

## 2024-02-16 VITALS — BP 124/82 | HR 78 | Temp 97.9°F | Ht 64.0 in | Wt 194.8 lb

## 2024-02-16 DIAGNOSIS — G8929 Other chronic pain: Secondary | ICD-10-CM

## 2024-02-16 DIAGNOSIS — M797 Fibromyalgia: Secondary | ICD-10-CM

## 2024-02-16 DIAGNOSIS — R42 Dizziness and giddiness: Secondary | ICD-10-CM

## 2024-02-16 DIAGNOSIS — M545 Low back pain, unspecified: Secondary | ICD-10-CM

## 2024-02-16 DIAGNOSIS — I10 Essential (primary) hypertension: Secondary | ICD-10-CM

## 2024-02-16 MED ORDER — MECLIZINE HCL 50 MG PO TABS: 50.0000 mg | ORAL_TABLET | Freq: Two times a day (BID) | ORAL | 1 refills | Status: DC

## 2024-02-16 MED ORDER — METHYLPREDNISOLONE 4 MG PO TBPK: ORAL_TABLET | ORAL | 1 refills | Status: DC

## 2024-02-16 NOTE — Assessment & Plan Note (Signed)
 Acute vertigo-like dizziness with room spinning, likely due to inner ear issues or fluid buildup. Eardrum bulging suggests ear pressure. Possible link to nocturnal hypertension. Meclizine and methylprednisolone considered for symptom relief and inflammation. - Prescribe meclizine 50 mg twice daily as needed for dizziness. - Prescribe methylprednisolone for potential ear inflammation and fluid buildup. - Monitor blood pressure and consider adjustments if dizziness persists.

## 2024-02-16 NOTE — Assessment & Plan Note (Signed)
 Chronic left-sided back pain, worsened by sitting, possibly musculoskeletal or nerve-related. Previous SI joint injection minimally effective. Concerns about pain medication reliance. - Continue physical therapy focusing on stretching and core strengthening. - Consider alternative pain management strategies to reduce reliance on Dilaudid. - Evaluate need for further imaging or specialist referral if pain persists.

## 2024-02-16 NOTE — Assessment & Plan Note (Signed)
 Continue to monitor symptoms Continue to follow up with rheumatology Will adjust treatment depending on symptoms

## 2024-02-16 NOTE — Assessment & Plan Note (Signed)
 Nocturnal hypertension with elevated morning readings. Current antihypertensives may need adjustment. Hypertension may contribute to dizziness and headaches. - Continue current antihypertensive regimen. - Monitor blood pressure regularly, especially at night. - Discuss potential medication adjustments with cardiologist during upcoming appointment.

## 2024-02-16 NOTE — Patient Instructions (Signed)
 VISIT SUMMARY:  You came in today because you were experiencing sudden dizziness and persistent back pain. You also mentioned having headaches for a week and high blood pressure at night. We discussed your history of fibromyalgia, hypertension, and previous vertigo. You have been attending physical therapy for your back pain and are considering knee replacement surgery.  YOUR PLAN:  -DIZZINESS: Your dizziness is likely due to inner ear issues or fluid buildup, which can cause a spinning sensation. We will treat this with Meclizine, 50 mg twice daily as needed, and Methylprednisolone to reduce inflammation. Please monitor your blood pressure and let us  know if the dizziness continues.  -HYPERTENSION: Your high blood pressure at night may be contributing to your dizziness and headaches. Continue with your current blood pressure medications and monitor your blood pressure regularly, especially at night. Discuss any potential medication adjustments with your cardiologist during your upcoming appointment.  -CHRONIC BACK PAIN: Your chronic back pain, which worsens when sitting, may be related to muscle or nerve issues. Continue with physical therapy focusing on stretching and core strengthening. We will look into alternative pain management strategies to reduce your reliance on Dilaudid. If the pain continues, we may need further imaging or a specialist referral.  -GOALS OF CARE: You are considering knee replacement surgery but are concerned about post-operative care due to limited support. We will discuss potential support options for your recovery if you decide to proceed with the surgery.  INSTRUCTIONS:  Please follow up with your cardiologist next week to discuss your blood pressure management and any potential links to your dizziness.
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
## 2024-02-24 ENCOUNTER — Encounter: Payer: Self-pay | Admitting: Physician Assistant

## 2024-02-24 ENCOUNTER — Other Ambulatory Visit: Payer: Self-pay | Admitting: Physician Assistant

## 2024-02-24 ENCOUNTER — Ambulatory Visit: Admitting: Physician Assistant

## 2024-02-24 VITALS — BP 146/78 | HR 62 | Temp 97.2°F | Ht 64.0 in | Wt 191.0 lb

## 2024-02-24 DIAGNOSIS — F411 Generalized anxiety disorder: Secondary | ICD-10-CM

## 2024-02-24 DIAGNOSIS — R051 Acute cough: Secondary | ICD-10-CM | POA: Diagnosis not present

## 2024-02-24 DIAGNOSIS — I10 Essential (primary) hypertension: Secondary | ICD-10-CM | POA: Diagnosis not present

## 2024-02-24 DIAGNOSIS — F5101 Primary insomnia: Secondary | ICD-10-CM

## 2024-02-24 DIAGNOSIS — R42 Dizziness and giddiness: Secondary | ICD-10-CM

## 2024-02-24 MED ORDER — CLARITHROMYCIN 500 MG PO TABS: 500.0000 mg | ORAL_TABLET | Freq: Two times a day (BID) | ORAL | 0 refills | Status: DC

## 2024-02-24 MED ORDER — ALPRAZOLAM 1 MG PO TABS: 1.0000 mg | ORAL_TABLET | Freq: Three times a day (TID) | ORAL | 0 refills | Status: DC

## 2024-02-24 MED ORDER — ERYTHROMYCIN 333 MG PO TBEC: 2.0000 | DELAYED_RELEASE_TABLET | Freq: Three times a day (TID) | ORAL | 0 refills | Status: DC
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
## 2024-02-24 NOTE — Assessment & Plan Note (Signed)
 Poor sleep despite Ambien  use. Mild sleep apnea with previous unsuccessful CPAP trial. Further evaluation needed. - Refer to pulmonologist specializing in sleep for further evaluation and management options.

## 2024-02-24 NOTE — Patient Instructions (Signed)
 VISIT SUMMARY:  During today's visit, we addressed your worsening cough, high blood pressure, vertigo, anxiety, and sleep issues. We discussed your current medications and made some adjustments to better manage your symptoms.  YOUR PLAN:  -COUGH WITH SUSPICION OF PERTUSSIS: Your persistent productive cough, which is severe enough to cause vomiting, may be due to pertussis (whooping cough) or bronchitis. Pertussis is a highly contagious respiratory tract infection. We have prescribed erythromycin to be taken three times daily and advised you to retest for COVID-19 in a few days. You may also consider using an over-the-counter cough suppressant if needed.  -HYPERTENSION: Your blood pressure has been high, reaching up to 180/90 mmHg. Hypertension is a condition where the force of the blood against your artery walls is too high. We have increased your carvedilol  dose to 12.5 mg twice daily and advised you to use captopril  as needed, up to 100 mg twice daily. We may also provide a note for your work to allow specific breaks for medication administration.  -VERTIGO: You have been experiencing intermittent vertigo, which is a sensation of spinning or dizziness. Your right ear shows signs of pain, clear fluid, and bulging. We will monitor your symptoms and ask you to report any worsening.  -ANXIETY WITH XANAX  USE: You are currently taking Xanax  for anxiety and wish to reduce the cost by obtaining the prescription from us . Anxiety is a feeling of worry or fear that can be intense. We will prescribe Xanax  as needed, with the goal of minimizing its use.  -INSOMNIA AND MILD SLEEP APNEA: You have been experiencing poor sleep despite using Ambien , and you have mild sleep apnea, which is a condition where your breathing repeatedly stops and starts during sleep. We will refer you to a pulmonologist who specializes in sleep for further evaluation and management options.  INSTRUCTIONS:  Please follow up with a  cardiologist for hypertension management. Contact us  if your blood pressure remains high after the medication adjustment. Message us  if you need an Ambien  refill. Retest for COVID-19 in a few days and monitor your vertigo symptoms, reporting any worsening.

## 2024-02-24 NOTE — Assessment & Plan Note (Signed)
 Persistent productive cough with vomiting. COVID negative, retest advised. Possible pertussis or bronchitis. Erythromycin chosen due to prior azithromycin  use. Informed about pertussis contagion and antibiotic effect. - Prescribe erythromycin three times daily. - Advise to retest for COVID in a few days. - Consider over-the-counter cough suppressant if needed.

## 2024-02-24 NOTE — Assessment & Plan Note (Signed)
 Blood pressure up to 180/90 mmHg with symptoms. Difficulty with captopril  schedule due to work. Current medications: carvedilol  and captopril . - Increase carvedilol  to 12.5 mg twice daily. - Use captopril  as needed for hypertension, up to 100 mg twice daily. - Consider writing a note for work to allow specific breaks for medication administration.

## 2024-02-24 NOTE — Assessment & Plan Note (Signed)
 Previously prescribed Xanax , now taking less than one per day. Seeking to reduce cost by obtaining prescription from primary care. - Prescribe Xanax  as needed, with aim to minimize use.

## 2024-02-24 NOTE — Assessment & Plan Note (Signed)
 Intermittent vertigo with improvement. Right ear pain with clear fluid and bulging. - Monitor symptoms and report any worsening.

## 2024-02-25 ENCOUNTER — Other Ambulatory Visit: Payer: Self-pay | Admitting: Physician Assistant

## 2024-02-25 DIAGNOSIS — B3731 Acute candidiasis of vulva and vagina: Secondary | ICD-10-CM

## 2024-02-25 MED ORDER — FLUCONAZOLE 150 MG PO TABS: 150.0000 mg | ORAL_TABLET | Freq: Once | ORAL | 0 refills | Status: AC

## 2024-02-26 ENCOUNTER — Encounter: Payer: Self-pay | Admitting: Physician Assistant

## 2024-02-27 ENCOUNTER — Other Ambulatory Visit: Payer: Self-pay | Admitting: Family Medicine

## 2024-02-27 MED ORDER — NYSTATIN 100000 UNIT/ML MT SUSP: 5.0000 mL | Freq: Four times a day (QID) | OROMUCOSAL | 0 refills | Status: DC

## 2024-02-28 ENCOUNTER — Telehealth: Payer: Self-pay

## 2024-02-28 ENCOUNTER — Ambulatory Visit: Payer: Medicare Other | Admitting: Cardiovascular Disease

## 2024-02-28 ENCOUNTER — Ambulatory Visit: Admitting: Sports Medicine

## 2024-02-28 NOTE — Telephone Encounter (Signed)
 Copied from CRM [id number]. Topic: General - Other >> Feb 28, 2024 11:46 AM Laura [name] wrote: Patient has not been able to since Friday and needs a doctor note
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
## 2024-02-29 ENCOUNTER — Other Ambulatory Visit (HOSPITAL_BASED_OUTPATIENT_CLINIC_OR_DEPARTMENT_OTHER): Payer: Self-pay

## 2024-02-29 ENCOUNTER — Encounter: Payer: Self-pay | Admitting: Physician Assistant

## 2024-02-29 ENCOUNTER — Ambulatory Visit: Admitting: Physician Assistant

## 2024-02-29 ENCOUNTER — Ambulatory Visit (HOSPITAL_BASED_OUTPATIENT_CLINIC_OR_DEPARTMENT_OTHER)
Admission: RE | Admit: 2024-02-29 | Discharge: 2024-02-29 | Disposition: A | Discharge status: Home / Self Care | Source: Ambulatory Visit | Attending: Physician Assistant | Admitting: Radiology

## 2024-02-29 VITALS — BP 132/76 | HR 76 | Temp 97.3°F | Ht 64.0 in | Wt 188.8 lb

## 2024-02-29 DIAGNOSIS — R051 Acute cough: Secondary | ICD-10-CM

## 2024-02-29 DIAGNOSIS — R059 Cough, unspecified: Secondary | ICD-10-CM | POA: Diagnosis not present

## 2024-02-29 DIAGNOSIS — F331 Major depressive disorder, recurrent, moderate: Secondary | ICD-10-CM | POA: Diagnosis not present

## 2024-02-29 DIAGNOSIS — E0849 Diabetes mellitus due to underlying condition with other diabetic neurological complication: Secondary | ICD-10-CM

## 2024-02-29 MED ORDER — CEFTRIAXONE SODIUM 1 G IJ SOLR
1.0000 g | Freq: Once | INTRAMUSCULAR | Status: AC
  Administered 2024-02-29: 1 g via INTRAMUSCULAR

## 2024-02-29 MED ORDER — FLUCONAZOLE 150 MG PO TABS
150.0000 mg | ORAL_TABLET | Freq: Once | ORAL | 1 refills | Status: AC
Start: 2024-02-29 — End: 2024-03-02
  Filled 2024-02-29: qty 2, 2d supply, fill #0

## 2024-02-29 NOTE — Patient Instructions (Signed)
 VISIT SUMMARY:  During your visit, we discussed your persistent cough, respiratory symptoms, and other related health concerns. We reviewed your current medications and symptoms, and we have made a plan to address each of your issues.  YOUR PLAN:  -COUGH WITH WHEEZING: Your persistent cough with wheezing and intermittent low oxygen levels may be due to pneumonia. We have ordered a chest x-ray to check for pneumonia and given you a Rocephin injection to start treating it. Please continue taking clarithromycin as prescribed. If your oxygen levels drop into the 80s and stay low, go to the emergency room immediately.  -PNEUMONIA: Pneumonia is an infection that inflames the air sacs in your lungs. We suspect you have pneumonia based on your symptoms and are awaiting the chest x-ray results to confirm it. Depending on the results, we may need to adjust your antibiotics.  -DIARRHEA: Your diarrhea may be related to your antibiotic use and could be causing low potassium levels. We will check your potassium levels and encourage you to keep drinking electrolyte solutions that contain potassium.  -THRUSH: Laura [name] is a fungal infection in your mouth, likely caused by antibiotic use. We have prescribed Diflucan  to treat it, and you have one refill available.  -HYPERLIPIDEMIA: Hyperlipidemia means you have high cholesterol levels. You are currently managing this with Repatha . We will evaluate your cholesterol levels at your next appointment and consider alternative treatments if needed.  INSTRUCTIONS:  Please follow up with the chest x-ray as soon as possible. Continue taking your medications as prescribed. If your oxygen levels drop into the 80s and remain low, go to the emergency room immediately. We will check your potassium levels and review your cholesterol levels at your next appointment. If you have any new or worsening symptoms, please contact our office.

## 2024-02-29 NOTE — Assessment & Plan Note (Addendum)
 Persistent cough with wheezing and intermittent hypoxemia. Nebulizer provided relief but caused shakiness. Differential includes progressing pneumonia. - Order chest x-ray stat to evaluate for pneumonia. - Administer Rocephin injection for potential pneumonia. - Continue clarithromycin for suspected community-acquired pneumonia. - Advise ER visit if oxygen saturation drops into the 80s and remains low.  Suspected pneumonia based on symptoms. Awaiting chest x-ray results for confirmation and targeted antibiotic therapy. - Await chest x-ray results to confirm pneumonia diagnosis. - Consider additional antibiotic if x-ray indicates specific type of pneumonia.

## 2024-02-29 NOTE — Assessment & Plan Note (Signed)
 Controlled Admits to difficulty due to multiple recent illnesses or flair ups  Continue to monitor for any worsening symptoms Will adjust treatment depending on all symptoms

## 2024-02-29 NOTE — Assessment & Plan Note (Signed)
 Controlled Denies any worsening or abnormal symptoms Continue to monitor diet and exercise We will adjust treatment depending on labs

## 2024-03-01 ENCOUNTER — Encounter: Payer: Self-pay | Admitting: Physician Assistant

## 2024-03-01 ENCOUNTER — Other Ambulatory Visit: Payer: Self-pay | Admitting: Physician Assistant

## 2024-03-01 DIAGNOSIS — J4521 Mild intermittent asthma with (acute) exacerbation: Secondary | ICD-10-CM

## 2024-03-01 LAB — COMPREHENSIVE METABOLIC PANEL WITH GFR
ALT: 23 IU/L (ref 0–32)
AST: 24 IU/L (ref 0–40)
Albumin: 4.6 g/dL (ref 3.8–4.9)
Alkaline Phosphatase: 74 IU/L (ref 44–121)
BUN/Creatinine Ratio: 33 — ABNORMAL HIGH (ref 9–23)
BUN: 27 mg/dL — ABNORMAL HIGH (ref 6–24)
Bilirubin Total: 0.4 mg/dL (ref 0.0–1.2)
CO2: 27 mmol/L (ref 20–29)
Calcium: 10.3 mg/dL — ABNORMAL HIGH (ref 8.7–10.2)
Chloride: 91 mmol/L — ABNORMAL LOW (ref 96–106)
Creatinine, Ser: 0.83 mg/dL (ref 0.57–1.00)
Globulin, Total: 2.8 g/dL (ref 1.5–4.5)
Glucose: 103 mg/dL — ABNORMAL HIGH (ref 70–99)
Potassium: 3.3 mmol/L — ABNORMAL LOW (ref 3.5–5.2)
Sodium: 137 mmol/L (ref 134–144)
Total Protein: 7.4 g/dL (ref 6.0–8.5)
eGFR: 82 mL/min/{1.73_m2} (ref >59)

## 2024-03-01 LAB — CBC WITH DIFFERENTIAL/PLATELET
Basophils Absolute: 0 10*3/uL (ref 0.0–0.2)
Basos: 1 %
EOS (ABSOLUTE): 0.1 10*3/uL (ref 0.0–0.4)
Eos: 1 %
Hematocrit: 47.1 % — ABNORMAL HIGH (ref 34.0–46.6)
Hemoglobin: 15.3 g/dL (ref 11.1–15.9)
Immature Grans (Abs): 0 10*3/uL (ref 0.0–0.1)
Immature Granulocytes: 0 %
Lymphocytes Absolute: 2.1 10*3/uL (ref 0.7–3.1)
Lymphs: 36 %
MCH: 28.1 pg (ref 26.6–33.0)
MCHC: 32.5 g/dL (ref 31.5–35.7)
MCV: 87 fL (ref 79–97)
Monocytes Absolute: 0.6 10*3/uL (ref 0.1–0.9)
Monocytes: 10 %
Neutrophils Absolute: 3.1 10*3/uL (ref 1.4–7.0)
Neutrophils: 52 %
Platelets: 329 10*3/uL (ref 150–450)
RBC: 5.44 x10E6/uL — ABNORMAL HIGH (ref 3.77–5.28)
RDW: 14.1 % (ref 11.7–15.4)
WBC: 6 10*3/uL (ref 3.4–10.8)

## 2024-03-01 MED ORDER — BREZTRI AEROSPHERE 160-9-4.8 MCG/ACT IN AERO: 2.0000 | INHALATION_SPRAY | Freq: Two times a day (BID) | RESPIRATORY_TRACT | 11 refills | Status: AC

## 2024-03-02 ENCOUNTER — Encounter: Payer: Self-pay | Admitting: Physician Assistant

## 2024-03-09 ENCOUNTER — Ambulatory Visit: Admitting: Sports Medicine

## 2024-03-13 ENCOUNTER — Ambulatory Visit: Payer: Medicare Other | Admitting: Family Medicine

## 2024-03-13 NOTE — Progress Notes (Unsigned)
 Laura [name] D.[facility] [address] [place] [address] Phone: [phone]   Assessment and Plan:     There are no diagnoses linked to this encounter.  ***   Pertinent previous records reviewed include ***    Follow Up: ***     Subjective:   I, Laura [name], am serving as a Neurosurgeon for Doctor Ulysees Gander   Chief Complaint: back pain    HPI:    01/31/2024 Patient is a 58 year old female with back pain. Patient states low back pain that has been there intermittently 2-3 month but now pain is constant pain radiates up to the thoracic. No MOI. She works at [employer] with heavy lifting. Hx of fibro/myo with low potassium. No meds for the pain. Rx for dilladed and she takes that as a last stitch effort. Not able to sit with out pain. Not able to sleep through the night. Does get trigger point with lovorne for trigger point injections     03/14/2024  Patient states  Relevant Historical Information: Hypertension, fibromyalgia, IBS, hypothyroidism, CKD, chronic pain syndrome  Additional pertinent review of systems negative.   Current Outpatient Medications:    albuterol  (VENTOLIN  HFA) 108 (90 Base) MCG/ACT inhaler, Inhale 2 puffs into the lungs every 4 (four) hours as needed for wheezing or shortness of breath., Disp: 18 g, Rfl: 3   ALPRAZolam  (XANAX ) 1 MG tablet, Take 1 tablet (1 mg total) by mouth 3 (three) times daily., Disp: 90 tablet, Rfl: 0   Blood Glucose Monitoring Suppl (ACCU-CHEK GUIDE) w/Device KIT, 1 each by Does not apply route daily., Disp: 1 kit, Rfl: 0   budeson-glycopyrrolate -formoterol  (BREZTRI  AEROSPHERE) 160-9-4.8 MCG/ACT AERO inhaler, Inhale 2 puffs into the lungs 2 (two) times daily., Disp: 10.7 g, Rfl: 11   captopril  (CAPOTEN ) 25 MG tablet, TAKE ONE TABLET BY MOUTH 3 TIMES DAILY, Disp: 270 tablet, Rfl: 3   carvedilol  (COREG ) 6.25 MG tablet, Take 1 tablet (6.25 mg total) by mouth 2 (two) times daily. PLEASE  CALL [phone] TO SCHEDULE A 6 MONTH FOLLOW UP VISIT PRIOR TO NEXT REFILL REQUEST. THANK YOU., Disp: 180 tablet, Rfl: 0   chlorthalidone  (HYGROTON ) 25 MG tablet, TAKE ONE TABLET BY MOUTH EVERY DAY, Disp: 90 tablet, Rfl: 0   clarithromycin  (BIAXIN ) 500 MG tablet, Take 1 tablet (500 mg total) by mouth 2 (two) times daily., Disp: 28 tablet, Rfl: 0   Dupilumab  (DUPIXENT ) 100 MG/0.67ML SOSY, Inject 1 Pen into the skin every 14 (fourteen) days., Disp: 1.34 mL, Rfl: 1   EPINEPHrine  0.3 mg/0.3 mL IJ SOAJ injection, Inject 0.3 mLs (0.3 mg total) into the muscle as needed for anaphylaxis., Disp: 1 each, Rfl: 2   Evolocumab  (REPATHA  SURECLICK) 140 MG/ML SOAJ, Inject 140 mg into the skin every 14 (fourteen) days., Disp: , Rfl:    furosemide  (LASIX ) 20 MG tablet, Take 1 tablet (20 mg total) by mouth 2 (two) times daily., Disp: 180 tablet, Rfl: 1   glucose blood (ACCU-CHEK GUIDE) test strip, 1 each by Other route daily in the afternoon. Use as instructed, Disp: 100 each, Rfl: 12   HYDROmorphone  (DILAUDID ) 2 MG tablet, Take 1 tablet (2 mg total) by mouth 3 (three) times daily as needed for moderate pain (pain score 4-6) (pains score 4-6)., Disp: 75 tablet, Rfl: 0   ipratropium-albuterol  (DUONEB) 0.5-2.5 (3) MG/3ML SOLN, Take 3 mLs by nebulization every 4 (four) hours as needed., Disp: 360 mL, Rfl: 2   levothyroxine  (SYNTHROID )  112 MCG tablet, TAKE ONE TABLET BY MOUTH EVERY DAY, Disp: 90 tablet, Rfl: 0   meclizine  (ANTIVERT ) 50 MG tablet, Take 1 tablet (50 mg total) by mouth 2 (two) times daily., Disp: 30 tablet, Rfl: 1   nystatin  (MYCOSTATIN ) 100000 UNIT/ML suspension, Take 5 mLs (500,000 Units total) by mouth 4 (four) times daily., Disp: 473 mL, Rfl: 0   nystatin  powder, Apply 1 Application topically 2 (two) times daily., Disp: , Rfl:    pantoprazole  (PROTONIX ) 40 MG tablet, Take 1 tablet (40 mg total) by mouth daily. Taking three times weekly, Disp: 90 tablet, Rfl: 2   potassium chloride  SA (KLOR-CON  M) 20 MEQ  tablet, Take 2 tablets (40 mEq total) by mouth 3 (three) times daily., Disp: 540 tablet, Rfl: 1   REPATHA  140 MG/ML SOSY, INJECT 1 SYRINGE SUBCUTANEOUSLY  EVERY 2 WEEKS, Disp: 7 mL, Rfl: 2   Ruxolitinib Phosphate  (OPZELURA ) 1.5 % CREA, Apply sparingly to affected areas twice daily if needed, Disp: 60 g, Rfl: 5   tiZANidine  (ZANAFLEX ) 4 MG tablet, Take 2 tablets (8 mg total) by mouth 2 (two) times daily. For muscle spasms, Disp: 120 tablet, Rfl: 5   Ubrogepant  (UBRELVY ) 100 MG TABS, Take 1 tablet (100 mg total) by mouth 2 (two) times daily as needed (Take one and if symptoms persist after 2 hours take a second one. Do not take more than 2 in 24 hours)., Disp: 30 tablet, Rfl: 2   zolpidem  (AMBIEN ) 10 MG tablet, Take 10 mg by mouth at bedtime., Disp: , Rfl:    Objective:     There were no vitals filed for this visit.    There is no height or weight on file to calculate BMI.    Physical Exam:    ***   Electronically signed by:  Marshall Skeeter D.[facility] 7:35 AM 03/13/24

## 2024-03-14 ENCOUNTER — Ambulatory Visit: Admitting: Sports Medicine

## 2024-03-14 VITALS — BP 148/80 | HR 79 | Ht 64.0 in | Wt 196.0 lb

## 2024-03-14 DIAGNOSIS — M9904 Segmental and somatic dysfunction of sacral region: Secondary | ICD-10-CM

## 2024-03-14 DIAGNOSIS — M533 Sacrococcygeal disorders, not elsewhere classified: Secondary | ICD-10-CM

## 2024-03-14 DIAGNOSIS — M797 Fibromyalgia: Secondary | ICD-10-CM | POA: Diagnosis not present

## 2024-03-14 DIAGNOSIS — M542 Cervicalgia: Secondary | ICD-10-CM | POA: Diagnosis not present

## 2024-03-14 DIAGNOSIS — M9903 Segmental and somatic dysfunction of lumbar region: Secondary | ICD-10-CM | POA: Diagnosis not present

## 2024-03-14 DIAGNOSIS — M545 Low back pain, unspecified: Secondary | ICD-10-CM | POA: Diagnosis not present

## 2024-03-14 DIAGNOSIS — M9908 Segmental and somatic dysfunction of rib cage: Secondary | ICD-10-CM | POA: Diagnosis not present

## 2024-03-14 DIAGNOSIS — M9902 Segmental and somatic dysfunction of thoracic region: Secondary | ICD-10-CM

## 2024-03-14 DIAGNOSIS — G894 Chronic pain syndrome: Secondary | ICD-10-CM | POA: Diagnosis not present

## 2024-03-14 DIAGNOSIS — M7918 Myalgia, other site: Secondary | ICD-10-CM

## 2024-03-14 DIAGNOSIS — G8929 Other chronic pain: Secondary | ICD-10-CM

## 2024-03-14 DIAGNOSIS — M9905 Segmental and somatic dysfunction of pelvic region: Secondary | ICD-10-CM | POA: Diagnosis not present

## 2024-03-14 NOTE — Patient Instructions (Signed)
 Follow up in 4 weeks. Restart HEP and Physical therapy. If no improvement in 2 to 4 weeks, call and ask for an MRI.

## 2024-03-19 ENCOUNTER — Other Ambulatory Visit: Payer: Self-pay | Admitting: Physical Medicine and Rehabilitation

## 2024-03-19 ENCOUNTER — Other Ambulatory Visit: Payer: Self-pay | Admitting: Family Medicine

## 2024-03-19 ENCOUNTER — Encounter: Payer: Self-pay | Admitting: Physician Assistant

## 2024-03-19 ENCOUNTER — Other Ambulatory Visit: Payer: Self-pay

## 2024-03-19 DIAGNOSIS — E039 Hypothyroidism, unspecified: Secondary | ICD-10-CM

## 2024-03-19 MED ORDER — CARVEDILOL 6.25 MG PO TABS: 6.2500 mg | ORAL_TABLET | Freq: Two times a day (BID) | ORAL | 0 refills | Status: DC

## 2024-03-27 ENCOUNTER — Other Ambulatory Visit: Payer: Self-pay

## 2024-04-03 ENCOUNTER — Encounter: Payer: Self-pay | Admitting: Cardiovascular Disease

## 2024-04-03 ENCOUNTER — Ambulatory Visit: Attending: Cardiovascular Disease | Admitting: Cardiovascular Disease

## 2024-04-03 VITALS — BP 148/94 | HR 84 | Ht 65.0 in | Wt 191.4 lb

## 2024-04-03 DIAGNOSIS — I493 Ventricular premature depolarization: Secondary | ICD-10-CM | POA: Diagnosis not present

## 2024-04-03 DIAGNOSIS — I10 Essential (primary) hypertension: Secondary | ICD-10-CM | POA: Diagnosis not present

## 2024-04-03 DIAGNOSIS — E785 Hyperlipidemia, unspecified: Secondary | ICD-10-CM

## 2024-04-03 MED ORDER — CARVEDILOL 12.5 MG PO TABS: 12.5000 mg | ORAL_TABLET | Freq: Two times a day (BID) | ORAL | 1 refills | Status: DC

## 2024-04-03 MED ORDER — LISINOPRIL 20 MG PO TABS
20.0000 mg | ORAL_TABLET | Freq: Every day | ORAL | 3 refills | Status: DC
Start: 2024-04-03 — End: 2024-06-15
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
## 2024-04-03 NOTE — Patient Instructions (Signed)
 Medication Instructions:   Your physician recommends the following medication changes.  STOP TAKING: Captopril     START TAKING: Lisinopril  20mg  by mouth daily   INCREASE: Coreg  to 12.5 mg by mouth twice a day   *If you need a refill on your cardiac medications before your next appointment, please call your pharmacy*  Lab Work:  No labs ordered today   If you have labs (blood work) drawn today and your tests are completely normal, you will receive your results only by: MyChart Message (if you have MyChart) OR A paper copy in the mail If you have any lab test that is abnormal or we need to change your treatment, we will call you to review the results.  Testing/Procedures:  No test ordered today    Follow-Up: At [facility], you and your health needs are our priority.  As part of our continuing mission to provide you with exceptional heart care, our providers are all part of one team.  This team includes your primary Cardiologist (physician) and Advanced Practice Providers or APPs (Physician Assistants and Nurse Practitioners) who all work together to provide you with the care you need, when you need it.  Your next appointment:    6 month(s)  Provider:    Antionette Kirks, MD  We recommend signing up for the patient portal called "MyChart".  Sign up information is provided on this After Visit Summary.  MyChart is used to connect with patients for Virtual Visits (Telemedicine).  Patients are able to view lab/test results, encounter notes, upcoming appointments, etc.  Non-urgent messages can be sent to your provider as well.   To learn more about what you can do with MyChart, go to [email or web address].

## 2024-04-10 NOTE — Progress Notes (Deleted)
 [name] D.[facility] [address] [place] [address] Phone: [phone]   Assessment and Plan:     There are no diagnoses linked to this encounter.  ***   Pertinent previous records reviewed include ***    Follow Up: ***     Subjective:   I, Laura [name], am serving as a Neurosurgeon for Doctor Ulysees Gander  Chief Complaint: back pain    HPI:    01/31/2024 Patient is a 58 year old female with back pain. Patient states low back pain that has been there intermittently 2-3 month but now pain is constant pain radiates up to the thoracic. No MOI. She works at [employer] with heavy lifting. Hx of fibro/myo with low potassium. No meds for the pain. Rx for dilladed and she takes that as a last stitch effort. Not able to sit with out pain. Not able to sleep through the night. Does get trigger point with lovorne for trigger point injections     03/14/2024  Patient states back is a little angry today. The injection that she got did not do anything. It can be good one day and bad the next. Elevated blood pressure today but patient has an appointment to get that checked in two weeks.   04/11/2024 Patient states  Relevant Historical Information: Hypertension, fibromyalgia, IBS, hypothyroidism, CKD, chronic pain syndrome  Additional pertinent review of systems negative.   Current Outpatient Medications:    albuterol  (VENTOLIN  HFA) 108 (90 Base) MCG/ACT inhaler, Inhale 2 puffs into the lungs every 4 (four) hours as needed for wheezing or shortness of breath., Disp: 18 g, Rfl: 3   ALPRAZolam  (XANAX ) 1 MG tablet, Take 1 tablet (1 mg total) by mouth 3 (three) times daily., Disp: 90 tablet, Rfl: 0   Blood Glucose Monitoring Suppl (ACCU-CHEK GUIDE) w/Device KIT, 1 each by Does not apply route daily., Disp: 1 kit, Rfl: 0   budeson-glycopyrrolate -formoterol  (BREZTRI  AEROSPHERE) 160-9-4.8 MCG/ACT AERO inhaler, Inhale 2 puffs into the lungs 2 (two)  times daily., Disp: 10.7 g, Rfl: 11   carvedilol  (COREG ) 12.5 MG tablet, Take 1 tablet (12.5 mg total) by mouth 2 (two) times daily., Disp: 180 tablet, Rfl: 1   chlorthalidone  (HYGROTON ) 25 MG tablet, TAKE ONE TABLET BY MOUTH EVERY DAY, Disp: 90 tablet, Rfl: 0   EPINEPHrine  0.3 mg/0.3 mL IJ SOAJ injection, Inject 0.3 mLs (0.3 mg total) into the muscle as needed for anaphylaxis., Disp: 1 each, Rfl: 2   Evolocumab  (REPATHA  SURECLICK) 140 MG/ML SOAJ, Inject 140 mg into the skin every 14 (fourteen) days., Disp: , Rfl:    furosemide  (LASIX ) 20 MG tablet, Take 1 tablet (20 mg total) by mouth 2 (two) times daily., Disp: 180 tablet, Rfl: 1   glucose blood (ACCU-CHEK GUIDE) test strip, 1 each by Other route daily in the afternoon. Use as instructed, Disp: 100 each, Rfl: 12   HYDROmorphone  (DILAUDID ) 2 MG tablet, Take 1 tablet (2 mg total) by mouth 3 (three) times daily as needed for moderate pain (pain score 4-6) (pains score 4-6)., Disp: 75 tablet, Rfl: 0   ipratropium-albuterol  (DUONEB) 0.5-2.5 (3) MG/3ML SOLN, Take 3 mLs by nebulization every 4 (four) hours as needed., Disp: 360 mL, Rfl: 2   levothyroxine  (SYNTHROID ) 112 MCG tablet, TAKE ONE TABLET BY MOUTH EVERY DAY, Disp: 90 tablet, Rfl: 0   lisinopril  (ZESTRIL ) 20 MG tablet, Take 1 tablet (20 mg total) by mouth daily., Disp: 90 tablet, Rfl: 3   nystatin  (  MYCOSTATIN ) 100000 UNIT/ML suspension, Take 5 mLs (500,000 Units total) by mouth 4 (four) times daily., Disp: 473 mL, Rfl: 0   nystatin  powder, Apply 1 Application topically 2 (two) times daily., Disp: , Rfl:    pantoprazole  (PROTONIX ) 40 MG tablet, Take 1 tablet (40 mg total) by mouth daily. Taking three times weekly, Disp: 90 tablet, Rfl: 2   potassium chloride  SA (KLOR-CON  M) 20 MEQ tablet, Take 2 tablets (40 mEq total) by mouth 3 (three) times daily., Disp: 540 tablet, Rfl: 1   Ruxolitinib Phosphate  (OPZELURA ) 1.5 % CREA, Apply sparingly to affected areas twice daily if needed, Disp: 60 g, Rfl: 5    tiZANidine  (ZANAFLEX ) 2 MG tablet, TAKE FOUR TABLETS BY MOUTH TWICE DAILY FOR MUSCLE SPASMS, Disp: 240 tablet, Rfl: 5   tiZANidine  (ZANAFLEX ) 4 MG tablet, Take 2 tablets (8 mg total) by mouth 2 (two) times daily. For muscle spasms, Disp: 120 tablet, Rfl: 5   Ubrogepant  (UBRELVY ) 100 MG TABS, Take 1 tablet (100 mg total) by mouth 2 (two) times daily as needed (Take one and if symptoms persist after 2 hours take a second one. Do not take more than 2 in 24 hours)., Disp: 30 tablet, Rfl: 2   zolpidem  (AMBIEN ) 10 MG tablet, Take 10 mg by mouth at bedtime., Disp: , Rfl:    Objective:     There were no vitals filed for this visit.    There is no height or weight on file to calculate BMI.    Physical Exam:    ***   Electronically signed by:  Marshall Skeeter D.[facility] 7:54 AM 04/10/24

## 2024-04-11 ENCOUNTER — Ambulatory Visit: Admitting: Sports Medicine

## 2024-04-12 ENCOUNTER — Ambulatory Visit: Admitting: Sports Medicine

## 2024-04-12 VITALS — BP 112/60 | HR 84 | Ht 65.0 in

## 2024-04-12 DIAGNOSIS — M1711 Unilateral primary osteoarthritis, right knee: Secondary | ICD-10-CM | POA: Diagnosis not present

## 2024-04-12 DIAGNOSIS — M797 Fibromyalgia: Secondary | ICD-10-CM

## 2024-04-12 DIAGNOSIS — M25561 Pain in right knee: Secondary | ICD-10-CM | POA: Diagnosis not present

## 2024-04-12 DIAGNOSIS — M7918 Myalgia, other site: Secondary | ICD-10-CM

## 2024-04-12 DIAGNOSIS — G8929 Other chronic pain: Secondary | ICD-10-CM | POA: Diagnosis not present

## 2024-04-12 NOTE — Progress Notes (Signed)
 Ben Kohan Azizi D.[facility] [address] [place] [address] Phone: [phone]   Assessment and Plan:     1. Chronic knee pain (Right) 2. Primary osteoarthritis of right knee -Chronic with exacerbation, subsequent visit - Consistent with recurrent flare of severe medial and patellofemoral compartment osteoarthritis - Patient has had multiple CSI, hyaluronic acid injections in the past that provided minimal to no relief.  Patient also had a negative experience where an injection caused a severe shooting pain throughout the entirety of her leg after an anterior lateral approach CSI, so she is fearful of having additional injections.  Decided to not repeat injection today - Patient is highly medication sensitive with 31 listed allergy /contraindications.  Does not tolerate NSAIDs or prednisone  - Recommend evaluation with orthopedic surgery to discuss surgical intervention.  Patient has seen Dr. Lynita Saris, orthopedic surgery, in the past for shoulder and would like to be referred back to him.  Referral placed  3. Myofascial pain dysfunction syndrome 4. Fibromyalgia  -Chronic with exacerbation, subsequent visit - While patient's back pain has overall improved and is doing well, patient continues to experience myalgias and no lasting relief with OMT - Recommend continuing HEP and physical therapy - Continue follow-up with [facility], Dr. Raynaldo Call  Pertinent previous records reviewed include right knee x-ray 2024  Follow Up: As needed   Subjective:   I, Adrienne Horning, am serving as a scribe for Dr. Ulysees Gander  Chief Complaint: Right knee pain  HPI:   01/31/2024 Patient is a 58 year old female with back pain. Patient states low back pain that has been there intermittently 2-3 month but now pain is constant pain radiates up to the thoracic. No MOI. She works at [employer] with heavy lifting. Hx of fibro/myo with low potassium. No meds for the  pain. Rx for dilladed and she takes that as a last stitch effort. Not able to sit with out pain. Not able to sleep through the night. Does get trigger point with lovorne for trigger point injections     03/14/2024  Patient states back is a little angry today. The injection that she got did not do anything. It can be good one day and bad the next. Elevated blood pressure today but patient has an appointment to get that checked in two weeks.   04/12/24 Today patient states that her back is doing great but had to leave work early because of her right knee. Is swollen and catches on her. She is worried about the swelling because she is on new blood pressure medicine is in contact with her cardiologist. Has all the mechanical symptoms patient has to climb ladders at work and it is causing her left hip to have pain because she is putting her weight on that side. Dilaudid  helps and does ice. Has had this issue for a long time.   Relevant Historical Information: Hypertension, fibromyalgia, IBS, hypothyroidism, CKD, chronic pain syndrome   Additional pertinent review of systems negative.   Current Outpatient Medications:    albuterol  (VENTOLIN  HFA) 108 (90 Base) MCG/ACT inhaler, Inhale 2 puffs into the lungs every 4 (four) hours as needed for wheezing or shortness of breath., Disp: 18 g, Rfl: 3   ALPRAZolam  (XANAX ) 1 MG tablet, Take 1 tablet (1 mg total) by mouth 3 (three) times daily., Disp: 90 tablet, Rfl: 0   Blood Glucose Monitoring Suppl (ACCU-CHEK GUIDE) w/Device KIT, 1 each by Does not apply route daily., Disp: 1 kit,  Rfl: 0   budeson-glycopyrrolate -formoterol  (BREZTRI  AEROSPHERE) 160-9-4.8 MCG/ACT AERO inhaler, Inhale 2 puffs into the lungs 2 (two) times daily., Disp: 10.7 g, Rfl: 11   carvedilol  (COREG ) 12.5 MG tablet, Take 1 tablet (12.5 mg total) by mouth 2 (two) times daily., Disp: 180 tablet, Rfl: 1   chlorthalidone  (HYGROTON ) 25 MG tablet, TAKE ONE TABLET BY MOUTH EVERY DAY, Disp: 90 tablet,  Rfl: 0   EPINEPHrine  0.3 mg/0.3 mL IJ SOAJ injection, Inject 0.3 mLs (0.3 mg total) into the muscle as needed for anaphylaxis., Disp: 1 each, Rfl: 2   Evolocumab  (REPATHA  SURECLICK) 140 MG/ML SOAJ, Inject 140 mg into the skin every 14 (fourteen) days., Disp: , Rfl:    furosemide  (LASIX ) 20 MG tablet, Take 1 tablet (20 mg total) by mouth 2 (two) times daily., Disp: 180 tablet, Rfl: 1   glucose blood (ACCU-CHEK GUIDE) test strip, 1 each by Other route daily in the afternoon. Use as instructed, Disp: 100 each, Rfl: 12   HYDROmorphone  (DILAUDID ) 2 MG tablet, Take 1 tablet (2 mg total) by mouth 3 (three) times daily as needed for moderate pain (pain score 4-6) (pains score 4-6)., Disp: 75 tablet, Rfl: 0   ipratropium-albuterol  (DUONEB) 0.5-2.5 (3) MG/3ML SOLN, Take 3 mLs by nebulization every 4 (four) hours as needed., Disp: 360 mL, Rfl: 2   levothyroxine  (SYNTHROID ) 112 MCG tablet, TAKE ONE TABLET BY MOUTH EVERY DAY, Disp: 90 tablet, Rfl: 0   lisinopril  (ZESTRIL ) 20 MG tablet, Take 1 tablet (20 mg total) by mouth daily., Disp: 90 tablet, Rfl: 3   nystatin  (MYCOSTATIN ) 100000 UNIT/ML suspension, Take 5 mLs (500,000 Units total) by mouth 4 (four) times daily., Disp: 473 mL, Rfl: 0   nystatin  powder, Apply 1 Application topically 2 (two) times daily., Disp: , Rfl:    pantoprazole  (PROTONIX ) 40 MG tablet, Take 1 tablet (40 mg total) by mouth daily. Taking three times weekly, Disp: 90 tablet, Rfl: 2   potassium chloride  SA (KLOR-CON  M) 20 MEQ tablet, Take 2 tablets (40 mEq total) by mouth 3 (three) times daily., Disp: 540 tablet, Rfl: 1   Ruxolitinib Phosphate  (OPZELURA ) 1.5 % CREA, Apply sparingly to affected areas twice daily if needed, Disp: 60 g, Rfl: 5   tiZANidine  (ZANAFLEX ) 2 MG tablet, TAKE FOUR TABLETS BY MOUTH TWICE DAILY FOR MUSCLE SPASMS, Disp: 240 tablet, Rfl: 5   tiZANidine  (ZANAFLEX ) 4 MG tablet, Take 2 tablets (8 mg total) by mouth 2 (two) times daily. For muscle spasms, Disp: 120 tablet, Rfl:  5   Ubrogepant  (UBRELVY ) 100 MG TABS, Take 1 tablet (100 mg total) by mouth 2 (two) times daily as needed (Take one and if symptoms persist after 2 hours take a second one. Do not take more than 2 in 24 hours)., Disp: 30 tablet, Rfl: 2   zolpidem  (AMBIEN ) 10 MG tablet, Take 10 mg by mouth at bedtime., Disp: , Rfl:    Objective:     Vitals:   04/12/24 1313  BP: 112/60  Pulse: 84  SpO2: 94%  Height: 5' 5 (1.651 m)      Body mass index is 31.85 kg/m.    Physical Exam:     General:  awake, alert oriented, no acute distress nontoxic Skin: no suspicious lesions or rashes Neuro:sensation intact and strength 5/5 with no deficits, no atrophy, normal muscle tone Psych: No signs of anxiety, depression or other mood disorder  Right knee: Mild swelling No deformity Positive fluid wave, joint milking ROM Flex 100, Ext 5  Nonspecific global TTP, though more significant along medial compartment, medial femoral condyle, medial patella  Gait antalgic, favoring left leg   Electronically signed by:  Marshall Skeeter D.[facility] 1:42 PM 04/12/24

## 2024-04-12 NOTE — Patient Instructions (Signed)
 Good to see you  Referred to Dr. Lynita Saris for knee surgery  Follow up as needed
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
## 2024-04-17 ENCOUNTER — Telehealth: Payer: Self-pay

## 2024-04-17 ENCOUNTER — Encounter: Payer: Self-pay | Admitting: Physician Assistant

## 2024-04-17 ENCOUNTER — Ambulatory Visit (INDEPENDENT_AMBULATORY_CARE_PROVIDER_SITE_OTHER): Admitting: Physician Assistant

## 2024-04-17 VITALS — BP 118/82 | HR 71 | Temp 97.8°F | Ht 65.0 in | Wt 192.0 lb

## 2024-04-17 DIAGNOSIS — K219 Gastro-esophageal reflux disease without esophagitis: Secondary | ICD-10-CM | POA: Insufficient documentation

## 2024-04-17 DIAGNOSIS — E039 Hypothyroidism, unspecified: Secondary | ICD-10-CM

## 2024-04-17 DIAGNOSIS — Z1231 Encounter for screening mammogram for malignant neoplasm of breast: Secondary | ICD-10-CM

## 2024-04-17 DIAGNOSIS — E876 Hypokalemia: Secondary | ICD-10-CM

## 2024-04-17 DIAGNOSIS — R7303 Prediabetes: Secondary | ICD-10-CM

## 2024-04-17 DIAGNOSIS — D8940 Mast cell activation, unspecified: Secondary | ICD-10-CM

## 2024-04-17 DIAGNOSIS — J4541 Moderate persistent asthma with (acute) exacerbation: Secondary | ICD-10-CM

## 2024-04-17 DIAGNOSIS — I10 Essential (primary) hypertension: Secondary | ICD-10-CM | POA: Diagnosis not present

## 2024-04-17 DIAGNOSIS — R0683 Snoring: Secondary | ICD-10-CM | POA: Insufficient documentation

## 2024-04-17 DIAGNOSIS — E782 Mixed hyperlipidemia: Secondary | ICD-10-CM

## 2024-04-17 DIAGNOSIS — G8929 Other chronic pain: Secondary | ICD-10-CM | POA: Diagnosis not present

## 2024-04-17 DIAGNOSIS — F331 Major depressive disorder, recurrent, moderate: Secondary | ICD-10-CM | POA: Diagnosis not present

## 2024-04-17 DIAGNOSIS — M25561 Pain in right knee: Secondary | ICD-10-CM

## 2024-04-17 DIAGNOSIS — M797 Fibromyalgia: Secondary | ICD-10-CM

## 2024-04-17 DIAGNOSIS — M7918 Myalgia, other site: Secondary | ICD-10-CM

## 2024-04-17 MED ORDER — CARVEDILOL 6.25 MG PO TABS: 6.2500 mg | ORAL_TABLET | Freq: Two times a day (BID) | ORAL | 3 refills | Status: DC

## 2024-04-17 MED ORDER — POTASSIUM CHLORIDE 20 MEQ PO PACK: 40.0000 meq | PACK | Freq: Three times a day (TID) | ORAL | 3 refills | Status: DC

## 2024-04-17 MED ORDER — METHYLPREDNISOLONE 4 MG PO TBPK: ORAL_TABLET | ORAL | 1 refills | Status: DC

## 2024-04-17 NOTE — Assessment & Plan Note (Signed)
 Chronic osteoarthritis with increased swelling. Delaying knee replacement due to personal circumstances. - Follow up with orthopedic surgeon on July 9 for evaluation of knee replacement surgery. - Discuss post-operative care options, including potential for in-home physical therapy and assistance with activities of daily living.

## 2024-04-17 NOTE — Assessment & Plan Note (Signed)
 Well controlled.  Continue to work on eating a healthy diet and exercise.  Labs drawn today.   Has got it situated with insurance to cover it and will start taking again this week having missed the last week Will adjust medication as needed depending on labs Lab Results  Component Value Date   LDLCALC 130 (H) 01/10/2024

## 2024-04-17 NOTE — Assessment & Plan Note (Signed)
 Chronic fibromyalgia with severe flare-up causing significant functional impairment. Methylprednisolone  effective in past. - Prescribe methylprednisolone  taper for fibromyalgia flare-up.

## 2024-04-17 NOTE — Assessment & Plan Note (Signed)
 Chronic GERD with recent exacerbation. Unable to tolerate proton pump inhibitors. Considering Voquezna. - Prescribe Voquezna for acid reduction. - Monitor for side effects such as abnormal taste. - Evaluate efficacy in reducing GERD symptoms.

## 2024-04-17 NOTE — Patient Instructions (Signed)
 VISIT SUMMARY:  During today's visit, we discussed your worsening fibromyalgia symptoms, medication management, and several other health concerns. We reviewed your current medications and made adjustments to better manage your conditions. We also planned follow-up appointments and tests to monitor your health.  YOUR PLAN:  -FIBROMYALGIA: Fibromyalgia is a condition characterized by widespread pain and tenderness. To manage your flare-up, we have prescribed a methylprednisolone  taper, which has been effective for you in the past.  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 We will coordinate with your pharmacist to help you obtain Opzelura  and consider alternative communication methods to ensure you get your medication.  -RIGHT KNEE OSTEOARTHRITIS: Osteoarthritis is a type of arthritis that occurs when flexible tissue at the ends of bones wears down. You have a follow-up with an orthopedic surgeon on July 9 to evaluate knee replacement surgery. We will also discuss post-operative care options.  -HYPERTENSION: Hypertension is high blood pressure. Your current regimen of lisinopril  20 mg daily and carvedilol  6.25 mg twice daily is effective. Continue with this medication plan.  -HYPOKALEMIA: Hypokalemia is low potassium levels in the blood. We are switching your potassium chloride  tablets to Chlorcon 40 mEq packets, three times daily, and will monitor your potassium levels with upcoming lab tests.  -GASTROESOPHAGEAL REFLUX DISEASE (GERD): GERD is a chronic condition where stomach acid frequently flows back into the tube connecting your mouth and stomach. We are prescribing Voquezna to help reduce your acid reflux and will monitor for any side effects.  -GENERAL HEALTH MAINTENANCE: You are due for a routine mammogram, which is scheduled for July 29. Continue with routine screenings as per guidelines.  INSTRUCTIONS:  Please follow up with  the orthopedic surgeon on July 9 for your knee evaluation. Schedule your mammogram for July 29. We will conduct comprehensive lab tests including blood count, kidney and liver function, electrolytes, A1c, cholesterol, and thyroid  function. Arrange for your home sleep study to evaluate for sleep apnea. Follow up in 3 months for reassessment of your conditions and treatment efficacy.

## 2024-04-17 NOTE — Assessment & Plan Note (Signed)
 Controlled Admits to difficulty due to multiple recent illnesses or flair ups  Continue to monitor for any worsening symptoms Will adjust treatment depending on all symptoms

## 2024-04-17 NOTE — Assessment & Plan Note (Signed)
 Intermittent flares coinciding with fibromyalgia. Difficulty obtaining Opzelura . - Coordinate with pharmacist to assist in obtaining Opzelura . - Consider alternative communication methods to ensure medication access.

## 2024-04-17 NOTE — Telephone Encounter (Signed)
-----   Message from Laura [name] sent at 04/17/2024 12:57 PM EDT ----- Regarding: Medicine questions States she has had a hard time hearing you when you call so she would like to schedule an in person visit for next Wednesday if possible

## 2024-04-17 NOTE — Assessment & Plan Note (Signed)
 Continues to have issues with sleep and snoring Continue to monitor sleep habits Sent for at home sleep study.

## 2024-04-17 NOTE — Assessment & Plan Note (Signed)
 Previously uncontrolled, now managed with lisinopril  and reduced carvedilol . Current regimen effective. - Continue lisinopril  20 mg daily. - Continue carvedilol  6.25 mg twice daily. BP Readings from Last 3 Encounters:  04/17/24 118/82  04/12/24 112/60  04/03/24 (!) 148/94

## 2024-04-17 NOTE — Assessment & Plan Note (Signed)
 Chronic low potassium with muscle cramps and bruising. Current supplementation may be insufficient. - Switch potassium chloride  tablets to Chlorcon 40 mEq packets, three times daily. - Monitor potassium levels with upcoming lab tests. - Consider increasing potassium supplementation if levels remain low.

## 2024-04-17 NOTE — Assessment & Plan Note (Signed)
 Controlled through diet and exercise Labs drawn today Will adjust treatment depending on results Continue to work on diet and exercise.

## 2024-04-18 LAB — CBC WITH DIFFERENTIAL/PLATELET
Basophils Absolute: 0 10*3/uL (ref 0.0–0.2)
Basos: 0 %
EOS (ABSOLUTE): 0 10*3/uL (ref 0.0–0.4)
Eos: 0 %
Hematocrit: 45.5 % (ref 34.0–46.6)
Hemoglobin: 14.6 g/dL (ref 11.1–15.9)
Immature Grans (Abs): 0.1 10*3/uL (ref 0.0–0.1)
Immature Granulocytes: 1 %
Lymphocytes Absolute: 2 10*3/uL (ref 0.7–3.1)
Lymphs: 21 %
MCH: 29 pg (ref 26.6–33.0)
MCHC: 32.1 g/dL (ref 31.5–35.7)
MCV: 91 fL (ref 79–97)
Monocytes Absolute: 0.5 10*3/uL (ref 0.1–0.9)
Monocytes: 5 %
Neutrophils Absolute: 7.1 10*3/uL — ABNORMAL HIGH (ref 1.4–7.0)
Neutrophils: 73 %
Platelets: 390 10*3/uL (ref 150–450)
RBC: 5.03 x10E6/uL (ref 3.77–5.28)
RDW: 13.9 % (ref 11.7–15.4)
WBC: 9.7 10*3/uL (ref 3.4–10.8)

## 2024-04-18 LAB — COMPREHENSIVE METABOLIC PANEL WITH GFR
ALT: 21 [IU]/L (ref 0–32)
AST: 17 [IU]/L (ref 0–40)
Albumin: 4.5 g/dL (ref 3.8–4.9)
Alkaline Phosphatase: 64 [IU]/L (ref 44–121)
BUN/Creatinine Ratio: 32 — ABNORMAL HIGH (ref 9–23)
BUN: 24 mg/dL (ref 6–24)
Bilirubin Total: 0.3 mg/dL (ref 0.0–1.2)
CO2: 25 mmol/L (ref 20–29)
Calcium: 10.2 mg/dL (ref 8.7–10.2)
Chloride: 94 mmol/L — ABNORMAL LOW (ref 96–106)
Creatinine, Ser: 0.76 mg/dL (ref 0.57–1.00)
Globulin, Total: 2.5 g/dL (ref 1.5–4.5)
Glucose: 117 mg/dL — ABNORMAL HIGH (ref 70–99)
Potassium: 4 mmol/L (ref 3.5–5.2)
Sodium: 138 mmol/L (ref 134–144)
Total Protein: 7 g/dL (ref 6.0–8.5)
eGFR: 91 mL/min/{1.73_m2}

## 2024-04-18 LAB — LIPID PANEL
Chol/HDL Ratio: 3.4 ratio (ref 0.0–4.4)
Cholesterol, Total: 266 mg/dL — ABNORMAL HIGH (ref 100–199)
HDL: 79 mg/dL
LDL Chol Calc (NIH): 159 mg/dL — ABNORMAL HIGH (ref 0–99)
Triglycerides: 160 mg/dL — ABNORMAL HIGH (ref 0–149)
VLDL Cholesterol Cal: 28 mg/dL (ref 5–40)

## 2024-04-18 LAB — HEMOGLOBIN A1C
Est. average glucose Bld gHb Est-mCnc: 140 mg/dL
Hgb A1c MFr Bld: 6.5 % — ABNORMAL HIGH (ref 4.8–5.6)

## 2024-04-18 LAB — T4, FREE: Free T4: 1.66 ng/dL (ref 0.82–1.77)

## 2024-04-18 LAB — TSH: TSH: 0.743 u[IU]/mL (ref 0.450–4.500)

## 2024-04-19 ENCOUNTER — Other Ambulatory Visit: Payer: Self-pay | Admitting: Physician Assistant

## 2024-04-19 ENCOUNTER — Ambulatory Visit: Payer: Self-pay | Admitting: Physician Assistant

## 2024-04-19 DIAGNOSIS — E876 Hypokalemia: Secondary | ICD-10-CM

## 2024-04-19 DIAGNOSIS — E1165 Type 2 diabetes mellitus with hyperglycemia: Secondary | ICD-10-CM | POA: Insufficient documentation

## 2024-04-19 MED ORDER — POTASSIUM CHLORIDE CRYS ER 10 MEQ PO TBCR: 10.0000 meq | EXTENDED_RELEASE_TABLET | Freq: Two times a day (BID) | ORAL | 3 refills | Status: DC

## 2024-04-19 MED ORDER — POTASSIUM CHLORIDE CRYS ER 20 MEQ PO TBCR: 20.0000 meq | EXTENDED_RELEASE_TABLET | Freq: Two times a day (BID) | ORAL | 3 refills | Status: DC

## 2024-04-23 ENCOUNTER — Telehealth: Payer: Self-pay | Admitting: Physician Assistant

## 2024-04-23 ENCOUNTER — Encounter: Payer: Self-pay | Admitting: Physician Assistant

## 2024-04-23 ENCOUNTER — Ambulatory Visit: Admitting: Physician Assistant

## 2024-04-23 VITALS — BP 132/68 | HR 96 | Temp 97.1°F | Ht 65.0 in | Wt 194.8 lb

## 2024-04-23 DIAGNOSIS — M159 Polyosteoarthritis, unspecified: Secondary | ICD-10-CM | POA: Diagnosis not present

## 2024-04-23 DIAGNOSIS — J0100 Acute maxillary sinusitis, unspecified: Secondary | ICD-10-CM

## 2024-04-23 DIAGNOSIS — K2100 Gastro-esophageal reflux disease with esophagitis, without bleeding: Secondary | ICD-10-CM

## 2024-04-23 DIAGNOSIS — I7300 Raynaud's syndrome without gangrene: Secondary | ICD-10-CM

## 2024-04-23 DIAGNOSIS — E1165 Type 2 diabetes mellitus with hyperglycemia: Secondary | ICD-10-CM

## 2024-04-23 DIAGNOSIS — E039 Hypothyroidism, unspecified: Secondary | ICD-10-CM

## 2024-04-23 DIAGNOSIS — R7989 Other specified abnormal findings of blood chemistry: Secondary | ICD-10-CM

## 2024-04-23 DIAGNOSIS — M797 Fibromyalgia: Secondary | ICD-10-CM

## 2024-04-23 MED ORDER — CLINDAMYCIN HCL 300 MG PO CAPS: 300.0000 mg | ORAL_CAPSULE | Freq: Three times a day (TID) | ORAL | 0 refills | Status: DC

## 2024-04-23 MED ORDER — OZEMPIC (0.25 OR 0.5 MG/DOSE) 2 MG/3ML ~~LOC~~ SOPN: 0.2500 mg | PEN_INJECTOR | SUBCUTANEOUS | 3 refills | Status: DC

## 2024-04-23 MED ORDER — FLUCONAZOLE 150 MG PO TABS: 150.0000 mg | ORAL_TABLET | Freq: Once | ORAL | 0 refills | Status: AC

## 2024-04-23 NOTE — Telephone Encounter (Signed)
 [facility]#[id number]
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
## 2024-04-25 ENCOUNTER — Other Ambulatory Visit: Payer: Self-pay

## 2024-04-25 ENCOUNTER — Telehealth: Payer: Self-pay | Admitting: Physician Assistant

## 2024-04-25 ENCOUNTER — Encounter: Payer: Self-pay | Admitting: Physician Assistant

## 2024-04-25 ENCOUNTER — Other Ambulatory Visit: Payer: Self-pay | Admitting: Physician Assistant

## 2024-04-25 DIAGNOSIS — K2100 Gastro-esophageal reflux disease with esophagitis, without bleeding: Secondary | ICD-10-CM

## 2024-04-25 DIAGNOSIS — E1165 Type 2 diabetes mellitus with hyperglycemia: Secondary | ICD-10-CM

## 2024-04-25 MED ORDER — VOQUEZNA 10 MG PO TABS: 10.0000 mg | ORAL_TABLET | Freq: Every day | ORAL | 3 refills | Status: AC

## 2024-04-25 MED ORDER — OZEMPIC (0.25 OR 0.5 MG/DOSE) 2 MG/3ML ~~LOC~~ SOPN: 0.2500 mg | PEN_INJECTOR | SUBCUTANEOUS | 3 refills | Status: DC

## 2024-04-25 MED ORDER — VOQUEZNA 10 MG PO TABS: 10.0000 mg | ORAL_TABLET | Freq: Every day | ORAL | 1 refills | Status: DC

## 2024-04-25 NOTE — Telephone Encounter (Signed)
 [facility]#[id number]

## 2024-04-26 ENCOUNTER — Ambulatory Visit (HOSPITAL_BASED_OUTPATIENT_CLINIC_OR_DEPARTMENT_OTHER)
Admission: RE | Admit: 2024-04-26 | Discharge: 2024-04-26 | Disposition: A | Discharge status: Home / Self Care | Source: Ambulatory Visit | Attending: Physician Assistant | Admitting: Physician Assistant

## 2024-04-26 ENCOUNTER — Other Ambulatory Visit: Payer: Self-pay

## 2024-04-26 ENCOUNTER — Encounter: Payer: Self-pay | Admitting: Physician Assistant

## 2024-04-26 DIAGNOSIS — R7989 Other specified abnormal findings of blood chemistry: Secondary | ICD-10-CM | POA: Insufficient documentation

## 2024-04-26 DIAGNOSIS — R946 Abnormal results of thyroid function studies: Secondary | ICD-10-CM | POA: Diagnosis not present

## 2024-04-26 DIAGNOSIS — E1165 Type 2 diabetes mellitus with hyperglycemia: Secondary | ICD-10-CM

## 2024-04-26 MED ORDER — OZEMPIC (0.25 OR 0.5 MG/DOSE) 2 MG/3ML ~~LOC~~ SOPN: 0.2500 mg | PEN_INJECTOR | SUBCUTANEOUS | 3 refills | Status: DC

## 2024-04-26 NOTE — Assessment & Plan Note (Addendum)
 Newly diagnosed with A1c of 6.5%. Weight gain and hyperglycemia despite diet. Metformin ineffective. Discussed Ozempic  for its cardiovascular and renal benefits and lower side effects compared to Mounjaro. Addressed concerns about medication costs. - Prescribe Ozempic  and send prescription to [facility] for insurance processing. - Provide a sample of Ozempic  for one month. - Discuss financial assistance options with Jacob, the pharmacist. Lab Results  Component Value Date   HGBA1C 6.5 (H) 04/17/2024   HGBA1C 6.3 (H) 01/10/2024   HGBA1C 6.2 (H) 09/13/2023

## 2024-04-26 NOTE — Assessment & Plan Note (Signed)
 Uncontrolled Continues to have symptoms Continues to have flairs with symptoms increasing recently Referral sent to rheumatology.

## 2024-04-26 NOTE — Assessment & Plan Note (Signed)
 Chronic pain affecting mobility. Considering knee replacement but managing with conservative therapy. Prefers rest and ice over medication. - Continue conservative management with rest and ice. - Evaluate for potential knee replacement surgery in the future.

## 2024-04-26 NOTE — Assessment & Plan Note (Signed)
 History of Raynauds along with other autoimmune issues Sent referral to Rheumatology Will follow up with them.

## 2024-04-26 NOTE — Assessment & Plan Note (Signed)
 Admits to improvement with Vozuezna 10mg  Will send to pharmacy Continue to monitor symptoms Will adjust treatment based on symptoms

## 2024-04-26 NOTE — Assessment & Plan Note (Signed)
 TSH decreased from 3.16 to 0.7, T4 at top of range. Symptoms include fatigue, tachycardia, and uncontrolled hypertension. Differential includes thyroid  nodule, autoimmune thyroiditis, or other dysfunction. Discussed need for thyroid  ultrasound and uptake scan. - Order thyroid  ultrasound at the med center. - Consider thyroid  uptake scan if ultrasound findings are inconclusive.

## 2024-04-26 NOTE — Assessment & Plan Note (Signed)
 Sent antibiotic Continue to monitor symptoms Will try new antibiotic if it does not help.

## 2024-04-30 ENCOUNTER — Encounter: Payer: Self-pay | Admitting: Physician Assistant

## 2024-04-30 ENCOUNTER — Ambulatory Visit (INDEPENDENT_AMBULATORY_CARE_PROVIDER_SITE_OTHER): Admitting: Physician Assistant

## 2024-04-30 VITALS — BP 106/72 | HR 74 | Temp 98.0°F | Resp 16 | Ht 65.0 in | Wt 188.4 lb

## 2024-04-30 DIAGNOSIS — E1165 Type 2 diabetes mellitus with hyperglycemia: Secondary | ICD-10-CM

## 2024-04-30 DIAGNOSIS — E039 Hypothyroidism, unspecified: Secondary | ICD-10-CM

## 2024-04-30 DIAGNOSIS — G8929 Other chronic pain: Secondary | ICD-10-CM | POA: Diagnosis not present

## 2024-04-30 DIAGNOSIS — M797 Fibromyalgia: Secondary | ICD-10-CM

## 2024-04-30 DIAGNOSIS — R1011 Right upper quadrant pain: Secondary | ICD-10-CM | POA: Diagnosis not present

## 2024-04-30 MED ORDER — METHYLPREDNISOLONE 4 MG PO TABS: 4.0000 mg | ORAL_TABLET | Freq: Every day | ORAL | 1 refills | Status: DC

## 2024-04-30 MED ORDER — ONDANSETRON 4 MG PO TBDP: 4.0000 mg | ORAL_TABLET | Freq: Three times a day (TID) | ORAL | 0 refills | Status: AC | PRN
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
## 2024-05-01 LAB — THYROID PANEL WITH TSH
Free Thyroxine Index: 3.3 (ref 1.2–4.9)
T3 Uptake Ratio: 28 % (ref 24–39)
T4, Total: 11.7 ug/dL (ref 4.5–12.0)
TSH: 1.5 u[IU]/mL (ref 0.450–4.500)

## 2024-05-01 LAB — THYROID PEROXIDASE ANTIBODY: Thyroperoxidase Ab SerPl-aCnc: 198 [IU]/mL — ABNORMAL HIGH (ref 0–34)

## 2024-05-01 LAB — THYROGLOBULIN ANTIBODY: Thyroglobulin Antibody: 3.4 [IU]/mL — ABNORMAL HIGH (ref 0.0–0.9)

## 2024-05-02 ENCOUNTER — Other Ambulatory Visit: Payer: Self-pay | Admitting: Physician Assistant

## 2024-05-03 ENCOUNTER — Encounter: Payer: Self-pay | Admitting: Physician Assistant

## 2024-05-03 ENCOUNTER — Ambulatory Visit: Payer: Self-pay | Admitting: Physician Assistant

## 2024-05-03 NOTE — Assessment & Plan Note (Signed)
 Symptoms and lab results suggest Hashimoto's thyroiditis with fluctuating thyroid  function. Possible hyperthyroid state indicated by low TSH and high T4. Autoimmune etiology suspected. Endocrinology referral recommended. - Order full thyroid  panel including TSH, T3, T4, and thyroid  peroxidase antibodies. - Refer to endocrinology for further management. - Consider adjusting Synthroid  dosage based on thyroid  panel results. - Discuss potential for radioactive iodine uptake test. - Prescribe low-dose methylprednisolone  for fatigue and muscle weakness.

## 2024-05-03 NOTE — Assessment & Plan Note (Signed)
 Persistent pain with no definitive diagnosis from previous CT imaging.

## 2024-05-03 NOTE — Assessment & Plan Note (Signed)
 On Ozempic  with initial nausea and vomiting, resolved by Friday. Willing to continue medication with nausea management. - Prescribe Zofran  for nausea management. Lab Results  Component Value Date   HGBA1C 6.5 (H) 04/17/2024   HGBA1C 6.3 (H) 01/10/2024   HGBA1C 6.2 (H) 09/13/2023

## 2024-05-03 NOTE — Assessment & Plan Note (Signed)
 Fibromyalgia causing muscle fatigue and pain. Previous methylprednisolone  provided temporary relief. - Prescribe low-dose methylprednisolone  for symptom relief.

## 2024-05-03 NOTE — Patient Instructions (Signed)
 VISIT SUMMARY:  During today's visit, we discussed your severe fatigue, muscle weakness, and other symptoms related to your fibromyalgia, type 2 diabetes, and thyroid  issues. We reviewed your current medications and made some adjustments to help manage your symptoms more effectively.  YOUR PLAN:  -AUTOIMMUNE THYROIDITIS: Autoimmune thyroiditis, also known as Hashimoto's thyroiditis, is a condition where your immune system attacks your thyroid , causing fluctuating thyroid  hormone levels. We will order a full thyroid  panel and refer you to an endocrinologist for further management. We may adjust your Synthroid  dosage based on the results and consider a radioactive iodine uptake test. In the meantime, we will prescribe a low-dose methylprednisolone  to help with your fatigue and muscle weakness.  -TYPE 2 DIABETES MELLITUS: Type 2 diabetes is a condition where your body does not use insulin  properly, leading to high blood sugar levels. You are currently on Ozempic , which initially caused nausea and vomiting. To help manage the nausea, we will prescribe Zofran .  -FIBROMYALGIA: Fibromyalgia is a condition characterized by widespread muscle pain and fatigue. We will prescribe a low-dose methylprednisolone  to help relieve your symptoms.  -RIGHT UPPER QUADRANT PAIN: You have persistent pain in your right upper abdomen that has not been diagnosed yet. We will continue to monitor this and may need further tests if the pain persists.  -GENERAL HEALTH MAINTENANCE: For your dry skin, which may be related to your thyroid  condition, and your sleep disturbances, we recommend taking Zyrtec during the day and Benadryl  at night for allergies. Continue taking Ambien  for sleep disturbances.  INSTRUCTIONS:  Please follow up with the endocrinologist based on your thyroid  panel results. We will monitor your symptoms and adjust your treatment as needed. If you experience any new or worsening symptoms, please contact our  office.

## 2024-05-09 ENCOUNTER — Ambulatory Visit (INDEPENDENT_AMBULATORY_CARE_PROVIDER_SITE_OTHER)

## 2024-05-09 ENCOUNTER — Encounter: Payer: Self-pay | Admitting: Orthopedic Surgery

## 2024-05-09 ENCOUNTER — Other Ambulatory Visit

## 2024-05-09 ENCOUNTER — Telehealth: Payer: Self-pay

## 2024-05-09 ENCOUNTER — Ambulatory Visit: Admitting: Orthopedic Surgery

## 2024-05-09 DIAGNOSIS — E1165 Type 2 diabetes mellitus with hyperglycemia: Secondary | ICD-10-CM

## 2024-05-09 DIAGNOSIS — M1711 Unilateral primary osteoarthritis, right knee: Secondary | ICD-10-CM

## 2024-05-09 DIAGNOSIS — M25561 Pain in right knee: Secondary | ICD-10-CM

## 2024-05-09 NOTE — Telephone Encounter (Signed)
 PAP: Patient assistance application for Opzelura  through [employer] has been mailed to pt's home address on file. Provider portion of application will be faxed to provider's office.   Provider portion of application has been faxed to Nola Angles for review

## 2024-05-09 NOTE — Progress Notes (Signed)
 05/09/2024 Name: Laura [name] MRN: [id number] DOB: 06-18-1966  No chief complaint on file.   Laura [name] is a 58 y.o. year old female who was referred for medication management by their primary care provider, Milon Cleaves, [name]. They presented for a face to face visit today.   They were referred to the pharmacist by their PCP for assistance in managing medication access    Subjective:  Care Team: Primary Care Provider: Milon Cleaves, [name] ; Next Scheduled Visit:  Future Appointments  Date Time Provider Department Center  05/22/2024  9:20 AM Milon Cleaves, PA COX-CFO None  05/23/2024  9:20 AM Cornelio Bouchard, MD CPR-PRMA CPR  05/29/2024  8:10 AM GI-BCG MOBILE MM 1 GI-BCGMO GI-BREAST CE  05/29/2024  1:30 PM Pandora Cadet, [facility] CHL-POPH None  07/24/2024  8:00 AM Milon Cleaves, PA COX-CFO None  10/02/2024  8:40 AM Rice, Lonni ORN, MD CR-GSO None    Medication Access/Adherence  Current Pharmacy:  [address] - [place], [place] - [address] [place] [address] Phone: [phone] Fax: [phone]  [facility] - [place], [place] - [address] [place] [address] Phone: [phone] Fax: [phone]  [id number] - [address] [place], [place] - [address] [place] [address] Phone: [phone] Fax: [phone]  [facility] - [place], [place] - [address] [place] [address] Phone: [phone] Fax: [phone]  [facility] - [place], [place] - [address] [place] [address] Phone: [phone] Fax: [phone]  [facility] - [facility] [address] [place] [address] Phone: [phone] Fax: [phone]  [facility] [phone] - [place], [place] - [address]  [facility] [address] [facility] [address] [place] [address] Phone: [phone] Fax: [phone]   Patient reports affordability concerns with their medications: Yes  Patient reports access/transportation concerns to their pharmacy: No  Patient reports adherence concerns with their medications:  Yes  - cost related concerns   Medication Management:  Patient had been referred for possible assistance with dupixent  but wishes to pursue assistance for opzelura , states this has helped with her TMEP the most and feels that would be most beneficial.   She currently uses breztri  for intermittent asthma symptoms, feels this helps and is not requiring any additional support for asthma management. No concerns with the copays of this medication.   Patient reports Good adherence to medications  Patient reports the following barriers to adherence: Cost of opzelura    Repatha  grant information HealthWell ID: [id number] Patient: Laura [name] Status: Approved Sub Status: Active Start Date 08/01/2023 End Date 07/30/2024   Card No.: [id number] Card Status: Active BIN: [id number] PCN: [id number] PC Group: [id number] Help Desk [phone]  Objective:  Lab Results  Component Value Date   HGBA1C 6.5 (H) 04/17/2024   HGBA1C 6.3 (H) 01/10/2024   HGBA1C 6.2 (H) 09/13/2023   HGBA1C 6.4 (H) 06/15/2023   HGBA1C 6.3 (H) 02/25/2023   HGBA1C 6.2 (H) 11/19/2022   HGBA1C 6.3 (H) 08/12/2022   HGBA1C 5.8 (H) 04/19/2022   HGBA1C 5.9 (H) 01/11/2022   HGBA1C 5.8 (H) 08/05/2021   HGBA1C 5.9 (H) 05/01/2021   HGBA1C 5.6 02/03/2021  HGBA1C 6.0 (H) 04/15/2020   HGBA1C 6.0 (H) 06/20/2019   HGBA1C 5.8 (H) 05/13/2017   HGBA1C 5.8 (H) 01/27/2017   HGBA1C 5.7 (H) 10/19/2016   HGBA1C 5.6 11/24/2015   HGBA1C 5.9 (H) 06/06/2015     Lab Results  Component Value Date   CREATININE 0.76 04/17/2024   BUN 24 04/17/2024   NA 138 04/17/2024   K 4.0 04/17/2024   CL 94 (L) 04/17/2024   CO2 25  04/17/2024    Lab Results  Component Value Date   CHOL 266 (H) 04/17/2024   HDL 79 04/17/2024   LDLCALC 159 (H) 04/17/2024   LDLDIRECT 222 (H) 04/26/2019   TRIG 160 (H) 04/17/2024   CHOLHDL 3.4 04/17/2024    Medications Reviewed Today     Reviewed by Pandora Cadet, [facility] (Pharmacist) on 05/09/24 at 1610  Med List Status: <None>   Medication Order Taking? Sig Documenting Provider Last Dose Status Informant  albuterol  (VENTOLIN  HFA) 108 (90 Base) MCG/ACT inhaler [id number]  Inhale 2 puffs into the lungs every 4 (four) hours as needed for wheezing or shortness of breath. Tapia, Leisa, PA-C  Active Self  ALPRAZolam  (XANAX ) 1 MG tablet [id number] Yes Take 1 tablet (1 mg total) by mouth 3 (three) times daily. Milon Cleaves, PA  Active   Blood Glucose Monitoring Suppl (ACCU-CHEK GUIDE) w/Device KIT [id number]  1 each by Does not apply route daily.  Patient not taking: Reported on 05/09/2024   Abran Jerilynn Loving, MD  Active Self  budeson-glycopyrrolate -formoterol  (BREZTRI  AEROSPHERE) 160-9-4.8 MCG/ACT AERO inhaler [id number] Yes Inhale 2 puffs into the lungs 2 (two) times daily. Milon Cleaves, PA  Active   carvedilol  (COREG ) 6.25 MG tablet [id number] Yes Take 1 tablet (6.25 mg total) by mouth 2 (two) times daily with a meal. Milon Cleaves, [name]  Active   chlorthalidone  (HYGROTON ) 25 MG tablet [id number] Yes TAKE ONE TABLET BY MOUTH EVERY DAY Milon Cleaves, [name]  Active   clindamycin  (CLEOCIN ) 300 MG capsule [id number]  Take 1 capsule (300 mg total) by mouth 3 (three) times daily. Milon Cleaves, PA  Consider Medication Status and Discontinue (Completed Course)   EPINEPHrine  0.3 mg/0.3 mL IJ SOAJ injection [id number]  Inject 0.3 mLs (0.3 mg total) into the muscle as needed for anaphylaxis. Abran Jerilynn Loving, MD  Active Self  Evolocumab  (REPATHA  SURECLICK) 140 MG/ML [name] [id number] Yes Inject 140 mg into the skin every 14 (fourteen) days. [provider]  Active   furosemide  (LASIX ) 20 MG tablet  [id number] Yes Take 1 tablet (20 mg total) by mouth 2 (two) times daily. Milon Cleaves, PA  Active   glucose blood (ACCU-CHEK GUIDE) test strip [id number]  1 each by Other route daily in the afternoon. Use as instructed  Patient not taking: Reported on 05/09/2024   Abran Jerilynn Loving, MD  Active Self  HYDROmorphone  (DILAUDID ) 2 MG tablet [id number] Yes Take 1 tablet (2 mg total) by mouth 3 (three) times daily as needed for moderate pain (pain score 4-6) (pains score 4-6). Lovorn, Megan, MD  Active   ipratropium-albuterol  (DUONEB) 0.5-2.5 (3) MG/3ML SOLN [id number] Yes Take 3 mLs by nebulization every 4 (four) hours as needed. Charlene Clotilda PARAS, NP  Active Self  levothyroxine  (SYNTHROID ) 112 MCG tablet [id number] Yes TAKE ONE TABLET BY MOUTH EVERY DAY Milon Cleaves, [name]  Active   lisinopril  (ZESTRIL ) 20 MG tablet [id number] Yes Take 1 tablet (20 mg total) by mouth daily. Darron Deatrice LABOR, MD  Active   methylPREDNISolone  (MEDROL ) 4 MG tablet [id number]  Yes Take 1 tablet (4 mg total) by mouth daily. Milon Cleaves, PA  Active   nystatin  (MYCOSTATIN ) 100000 UNIT/ML suspension [id number]  Take 5 mLs (500,000 Units total) by mouth 4 (four) times daily. Sherre Clapper, MD  Active   nystatin  powder [id number]  Apply 1 Application topically 2 (two) times daily. [provider]  Active Self  ondansetron  (ZOFRAN -ODT) 4 MG disintegrating tablet [id number] Yes Take 1 tablet (4 mg total) by mouth every 8 (eight) hours as needed for nausea or vomiting. Milon Cleaves, PA  Active   potassium chloride  (KLOR-CON  M) 10 MEQ tablet [id number]  Take 1 tablet (10 mEq total) by mouth 2 (two) times daily. Milon Cleaves, PA  Active   potassium chloride  SA (KLOR-CON  M) 20 MEQ tablet [id number] Yes Take 1 tablet (20 mEq total) by mouth 2 (two) times daily. Milon Cleaves, PA  Active   Ruxolitinib Phosphate  (OPZELURA ) 1.5 % CREA [id number]  Apply sparingly to affected areas twice daily if needed Kozlow, Camellia PARAS, MD  Active   Semaglutide ,0.25  or 0.5MG /DOS, (OZEMPIC , 0.25 OR 0.5 MG/DOSE,) 2 MG/3ML SOPN [id number] Yes Inject 0.25 mg into the skin once a week for 12 doses. Milon Cleaves, PA  Active   tiZANidine  (ZANAFLEX ) 2 MG tablet [id number]  TAKE FOUR TABLETS BY MOUTH TWICE DAILY FOR MUSCLE SPASMS Lovorn, Megan, MD  Active   tiZANidine  (ZANAFLEX ) 4 MG tablet [id number]  Take 2 tablets (8 mg total) by mouth 2 (two) times daily. For muscle spasms Lovorn, Megan, MD  Active   Ubrogepant  (UBRELVY ) 100 MG TABS [id number]  Take 1 tablet (100 mg total) by mouth 2 (two) times daily as needed (Take one and if symptoms persist after 2 hours take a second one. Do not take more than 2 in 24 hours). Milon Cleaves, PA  Active   Vonoprazan Fumarate  (VOQUEZNA ) 10 MG TABS [id number]  Take 10 mg by mouth daily. Cox, Kirsten, MD  Consider Medication Status and Discontinue (Duplicate)   Vonoprazan Fumarate  (VOQUEZNA ) 10 MG TABS [id number] Yes Take 10 mg by mouth daily. Milon Cleaves, PA  Active   zolpidem  (AMBIEN ) 10 MG tablet [id number]  Take 10 mg by mouth at bedtime. [provider]  Active Self            Assessment/Plan:   Medication Management/Asthma: - Currently strategy insufficient to maintain appropriate adherence to prescribed medication regimen - reviewed PAP criteria for incytecares to see if patient may qualify for opzelura  - appears she has previously attempted through Dr MARLA, unsure of outcome, seems she would qualify after conversation today.  - did also review PAP for dupixent  - does not qualify d/t having med d coverage for this specific program.   Follow Up Plan: 2 wk check in, will coordinate opzelura  pap with CPhT. [email or web address]  Lang Sieve, PharmD, BCGP Clinical Pharmacist  [phone]
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
## 2024-05-10 ENCOUNTER — Encounter: Payer: Self-pay | Admitting: Physician Assistant

## 2024-05-10 ENCOUNTER — Other Ambulatory Visit: Payer: Self-pay | Admitting: Physician Assistant

## 2024-05-10 ENCOUNTER — Other Ambulatory Visit: Payer: Self-pay

## 2024-05-10 MED ORDER — ZOLPIDEM TARTRATE 10 MG PO TABS: 10.0000 mg | ORAL_TABLET | Freq: Every day | ORAL | 2 refills | Status: DC

## 2024-05-10 NOTE — Progress Notes (Signed)
 Spoke with PCP regarding clarification on signing off opzelura  PAP application - PCP ok'd sign off. Requested Inocente to resend to office.   Lang Sieve, PharmD, BCGP Clinical Pharmacist  [phone]

## 2024-05-12 MED ORDER — BUPIVACAINE HCL 0.25 % IJ SOLN
4.0000 mL | INTRAMUSCULAR | Status: AC | PRN
  Administered 2024-05-09: 4 mL via INTRA_ARTICULAR

## 2024-05-12 MED ORDER — LIDOCAINE HCL 1 % IJ SOLN
5.0000 mL | INTRAMUSCULAR | Status: AC | PRN
  Administered 2024-05-09: 5 mL

## 2024-05-12 MED ORDER — TRIAMCINOLONE ACETONIDE 40 MG/ML IJ SUSP
40.0000 mg | INTRAMUSCULAR | Status: AC | PRN
  Administered 2024-05-09: 40 mg via INTRA_ARTICULAR

## 2024-05-22 ENCOUNTER — Encounter: Payer: Self-pay | Admitting: Physician Assistant

## 2024-05-22 ENCOUNTER — Ambulatory Visit (INDEPENDENT_AMBULATORY_CARE_PROVIDER_SITE_OTHER): Admitting: Physician Assistant

## 2024-05-22 VITALS — BP 116/70 | HR 69 | Temp 98.0°F | Ht 63.0 in | Wt 184.0 lb

## 2024-05-22 DIAGNOSIS — E782 Mixed hyperlipidemia: Secondary | ICD-10-CM

## 2024-05-22 DIAGNOSIS — E1165 Type 2 diabetes mellitus with hyperglycemia: Secondary | ICD-10-CM | POA: Diagnosis not present

## 2024-05-22 DIAGNOSIS — R7989 Other specified abnormal findings of blood chemistry: Secondary | ICD-10-CM | POA: Diagnosis not present

## 2024-05-22 DIAGNOSIS — M1711 Unilateral primary osteoarthritis, right knee: Secondary | ICD-10-CM | POA: Diagnosis not present

## 2024-05-22 DIAGNOSIS — E538 Deficiency of other specified B group vitamins: Secondary | ICD-10-CM | POA: Diagnosis not present

## 2024-05-22 DIAGNOSIS — I10 Essential (primary) hypertension: Secondary | ICD-10-CM

## 2024-05-22 MED ORDER — OZEMPIC (0.25 OR 0.5 MG/DOSE) 2 MG/3ML ~~LOC~~ SOPN: PEN_INJECTOR | SUBCUTANEOUS | 0 refills | Status: DC

## 2024-05-22 NOTE — Assessment & Plan Note (Signed)
 Vitamin D  level drawn today Continue to monitor knee pain Will adjust Vitamin D  supplementation as needed based on results

## 2024-05-22 NOTE — Assessment & Plan Note (Signed)
 Has been on methylprednisolone  for the past month Labs drawn to see if it has decreased antibody activity Continue to monitor for any change in symptoms Labs drawn today
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
## 2024-05-22 NOTE — Assessment & Plan Note (Signed)
 Previously uncontrolled, now managed with lisinopril  and reduced carvedilol . Current regimen effective. - Continue lisinopril  20 mg daily. - Continue carvedilol  6.25 mg twice daily. BP Readings from Last 3 Encounters:  05/22/24 116/70  04/30/24 106/72  04/23/24 132/68

## 2024-05-22 NOTE — Assessment & Plan Note (Signed)
 Well controlled.  Continue to work on eating a healthy diet and exercise.  Labs drawn today.   Continue Repatha  as prescribed  Will adjust medication as needed depending on labs Lab Results  Component Value Date   LDLCALC 159 (H) 04/17/2024

## 2024-05-22 NOTE — Assessment & Plan Note (Signed)
 Will check A1C at next chronic visit Denies frequent or worsening side effects Will continue with .25mg  for another month Will increase if side effects are minimal.

## 2024-05-22 NOTE — Assessment & Plan Note (Signed)
 Labs drawn today Continue to monitor for new or changing symptoms Will adjust treatment based on labs today

## 2024-05-23 ENCOUNTER — Encounter: Payer: Self-pay | Admitting: Physical Medicine and Rehabilitation

## 2024-05-23 ENCOUNTER — Encounter: Payer: Self-pay | Admitting: Physician Assistant

## 2024-05-23 ENCOUNTER — Encounter: Attending: Physical Medicine and Rehabilitation | Admitting: Physical Medicine and Rehabilitation

## 2024-05-23 VITALS — BP 113/80 | HR 73 | Ht 63.0 in | Wt 187.0 lb

## 2024-05-23 DIAGNOSIS — R5381 Other malaise: Secondary | ICD-10-CM | POA: Diagnosis not present

## 2024-05-23 DIAGNOSIS — R5383 Other fatigue: Secondary | ICD-10-CM | POA: Diagnosis not present

## 2024-05-23 DIAGNOSIS — D8940 Mast cell activation, unspecified: Secondary | ICD-10-CM | POA: Diagnosis not present

## 2024-05-23 DIAGNOSIS — G894 Chronic pain syndrome: Secondary | ICD-10-CM | POA: Insufficient documentation

## 2024-05-23 DIAGNOSIS — G43011 Migraine without aura, intractable, with status migrainosus: Secondary | ICD-10-CM | POA: Diagnosis not present

## 2024-05-23 LAB — LIPID PANEL
Chol/HDL Ratio: 4.1 ratio (ref 0.0–4.4)
Cholesterol, Total: 312 mg/dL — ABNORMAL HIGH (ref 100–199)
HDL: 77 mg/dL (ref >39)
LDL Chol Calc (NIH): 190 mg/dL — ABNORMAL HIGH (ref 0–99)
Triglycerides: 238 mg/dL — ABNORMAL HIGH (ref 0–149)
VLDL Cholesterol Cal: 45 mg/dL — ABNORMAL HIGH (ref 5–40)

## 2024-05-23 LAB — CBC WITH DIFFERENTIAL/PLATELET
Basophils Absolute: 0 x10E3/uL (ref 0.0–0.2)
Basos: 0 %
EOS (ABSOLUTE): 0.1 x10E3/uL (ref 0.0–0.4)
Eos: 1 %
Hematocrit: 46.4 % (ref 34.0–46.6)
Hemoglobin: 14.7 g/dL (ref 11.1–15.9)
Immature Grans (Abs): 0 x10E3/uL (ref 0.0–0.1)
Immature Granulocytes: 0 %
Lymphocytes Absolute: 3.1 x10E3/uL (ref 0.7–3.1)
Lymphs: 40 %
MCH: 29.1 pg (ref 26.6–33.0)
MCHC: 31.7 g/dL (ref 31.5–35.7)
MCV: 92 fL (ref 79–97)
Monocytes Absolute: 0.5 x10E3/uL (ref 0.1–0.9)
Monocytes: 7 %
Neutrophils Absolute: 4.1 x10E3/uL (ref 1.4–7.0)
Neutrophils: 52 %
Platelets: 271 x10E3/uL (ref 150–450)
RBC: 5.06 x10E6/uL (ref 3.77–5.28)
RDW: 13.6 % (ref 11.7–15.4)
WBC: 7.9 x10E3/uL (ref 3.4–10.8)

## 2024-05-23 LAB — THYROID PEROXIDASE ANTIBODY: Thyroperoxidase Ab SerPl-aCnc: 195 [IU]/mL — ABNORMAL HIGH (ref 0–34)

## 2024-05-23 LAB — COMPREHENSIVE METABOLIC PANEL WITH GFR
ALT: 22 IU/L (ref 0–32)
AST: 19 IU/L (ref 0–40)
Albumin: 4.6 g/dL (ref 3.8–4.9)
Alkaline Phosphatase: 54 IU/L (ref 44–121)
BUN/Creatinine Ratio: 33 — ABNORMAL HIGH (ref 9–23)
BUN: 32 mg/dL — ABNORMAL HIGH (ref 6–24)
Bilirubin Total: 0.5 mg/dL (ref 0.0–1.2)
CO2: 23 mmol/L (ref 20–29)
Calcium: 10.3 mg/dL — ABNORMAL HIGH (ref 8.7–10.2)
Chloride: 98 mmol/L (ref 96–106)
Creatinine, Ser: 0.97 mg/dL (ref 0.57–1.00)
Globulin, Total: 2.4 g/dL (ref 1.5–4.5)
Glucose: 105 mg/dL — ABNORMAL HIGH (ref 70–99)
Potassium: 4.1 mmol/L (ref 3.5–5.2)
Sodium: 140 mmol/L (ref 134–144)
Total Protein: 7 g/dL (ref 6.0–8.5)
eGFR: 68 mL/min/1.73 (ref >59)

## 2024-05-23 LAB — B12 AND FOLATE PANEL
Folate: 15 ng/mL (ref >3.0)
Vitamin B-12: 779 pg/mL (ref 232–1245)

## 2024-05-23 LAB — VITAMIN D 25 HYDROXY (VIT D DEFICIENCY, FRACTURES): Vit D, 25-Hydroxy: 31.3 ng/mL (ref 30.0–100.0)

## 2024-05-23 MED ORDER — HYDROMORPHONE HCL 2 MG PO TABS: 2.0000 mg | ORAL_TABLET | Freq: Three times a day (TID) | ORAL | 0 refills | Status: DC | PRN
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
## 2024-05-23 NOTE — Patient Instructions (Signed)
 Pt is a 58 yr old female with hx of HTN, moderate persistent asthma; hypothyroidism- latest TSH 1.41, fibromyalgia, Migraines, and BMI 31; Told she has Kidney disease? Thinks due to swelling up so much- -  Latest BUN/Cr 22/0.77.  Has many allergies 30 meds- also has IBS Here for evaluation for pain.  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  Also has trochanteric bursitis.  F/U on chronic pain syndrome.  Also A1c 6.5- so meets criteria for DM now- started on Ozempic .     Last UDS 02/08/24- was good- so not due per clinic policy.   2. Have pt come in q3 months, because rarely takes pain meds- taken 60 tabs in 3 months, so don't see need to have her come in q2 months   3.  Would strongly suggest sending to Endocrinology as well- has seen physician at office as well.  Pt's TSH is low normal and having Sx's of hyperthyroidism-   4.  Weight is down 5 lbs- down to 187 lbs from 192- so doing better    5. F/U in 3 months for f/u on chronic pain.

## 2024-05-24 ENCOUNTER — Ambulatory Visit: Payer: Self-pay | Admitting: Physician Assistant

## 2024-05-24 NOTE — Telephone Encounter (Signed)
 Spoke with patient regarding patient assistance application mailed to her on 7/9.  Patient reports she mailed application back on 7/23.

## 2024-05-28 ENCOUNTER — Other Ambulatory Visit: Payer: Self-pay | Admitting: Physician Assistant

## 2024-05-28 DIAGNOSIS — E063 Autoimmune thyroiditis: Secondary | ICD-10-CM

## 2024-05-28 MED ORDER — VITAMIN D (ERGOCALCIFEROL) 1.25 MG (50000 UNIT) PO CAPS
50000.0000 [IU] | ORAL_CAPSULE | ORAL | 10 refills | Status: AC
Start: 2024-05-28 — End: ?

## 2024-05-28 NOTE — Telephone Encounter (Signed)
 PAP: Application for Opzelura  has been submitted to [employer], via fax

## 2024-05-29 ENCOUNTER — Ambulatory Visit
Admission: RE | Admit: 2024-05-29 | Discharge: 2024-05-29 | Disposition: A | Discharge status: Home / Self Care | Source: Ambulatory Visit | Attending: Physician Assistant | Admitting: Physician Assistant

## 2024-05-29 ENCOUNTER — Other Ambulatory Visit: Payer: Self-pay

## 2024-05-29 DIAGNOSIS — Z1231 Encounter for screening mammogram for malignant neoplasm of breast: Secondary | ICD-10-CM | POA: Diagnosis not present

## 2024-05-29 NOTE — Progress Notes (Signed)
 05/29/2024 Name: Laura [name] MRN: [id number] DOB: [birth year]  Chief Complaint  Patient presents with   Medication Management    Laura [name] is a 58 y.o. year old female who was referred for medication management by their primary care provider, Milon Cleaves, [name]. They presented for a face to face visit today.   They were referred to the pharmacist by their PCP for assistance in managing medication access    Subjective:  Care Team: Primary Care Provider: Milon Cleaves, [name] ; Next Scheduled Visit:  Future Appointments  Date Time Provider Department Center  07/24/2024  8:00 AM Milon Cleaves, [name] [facility] None  08/20/2024  2:40 PM Cornelio Bouchard, MD [facility]  10/02/2024  8:40 AM Rice, Lonni ORN, MD [facility] None    Medication Access/Adherence  Current Pharmacy:  [address] - [place], [place] - [address] [place] [address] Phone: [phone] Fax: [phone]  [facility] - [place], [place] - [address] [place] [address] Phone: [phone] Fax: [phone]  [id number] - [address] [place], [place] - [address] [place] [address] Phone: [phone] Fax: [phone]  [facility] - [place], [place] - [address] [place] [address] Phone: [phone] Fax: [phone]  [facility] - [place], [place] - [address] [place] [address] Phone: [phone] Fax: [phone]  [facility] [address] [place] [address] Phone: [phone] Fax: [phone]  [facility] [phone] - [place], [place] - [address] [facility] [address] [facility] [address] [place] [address] Phone: [phone] Fax: [phone]   Patient  reports affordability concerns with their medications: Yes  Patient reports access/transportation concerns to their pharmacy: No  Patient reports adherence concerns with their medications:  Yes  - [name] related concerns   Medication Management:  Patient had been referred for possible assistance with dupixent  but wishes to pursue assistance for opzelura , states this has helped with her TMEP the most and feels that would be most beneficial.   She currently uses breztri  for intermittent asthma symptoms, feels this helps and is not requiring any additional support for asthma management. No concerns with the copays of this medication.   Patient reports Good adherence to medications  Patient reports the following barriers to adherence: [name] of opzelura    Repatha  grant information [employer] ID: [id number] Patient: Laura [name] Status: Approved Sub Status: Active Start Date 08/01/2023 End Date 07/30/2024   Card No.: [id number] Card Status: Active BIN: [id number] PCN: [id number] PC Group: [id number] Help Desk [phone]  Objective:  Lab Results  Component Value Date   HGBA1C 6.5 (H) 04/17/2024   HGBA1C 6.3 (H) 01/10/2024   HGBA1C 6.2 (H) 09/13/2023   HGBA1C 6.4 (H) 06/15/2023   HGBA1C 6.3 (H) 02/25/2023   HGBA1C 6.2 (H) 11/19/2022   HGBA1C 6.3 (H) 08/12/2022   HGBA1C 5.8 (H) 04/19/2022   HGBA1C 5.9 (H) 01/11/2022   HGBA1C 5.8 (H) 08/05/2021   HGBA1C 5.9 (H) 05/01/2021   HGBA1C 5.6 02/03/2021   HGBA1C 6.0 (H) 04/15/2020   HGBA1C 6.0 (H) 06/20/2019   HGBA1C 5.8 (H) 05/13/2017   HGBA1C 5.8 (H) 01/27/2017   HGBA1C 5.7 (  H) 10/19/2016   HGBA1C 5.6 11/24/2015   HGBA1C 5.9 (H) 06/06/2015     Lab Results  Component Value Date   CREATININE 0.97 05/22/2024   BUN 32 (H) 05/22/2024   NA 140 05/22/2024   K 4.1 05/22/2024   CL 98 05/22/2024   CO2 23 05/22/2024    Lab Results  Component Value Date   CHOL 312 (H) 05/22/2024   HDL 77 05/22/2024   LDLCALC 190 (H) 05/22/2024    LDLDIRECT 222 (H) 04/26/2019   TRIG 238 (H) 05/22/2024   CHOLHDL 4.1 05/22/2024    Medications Reviewed Today   Medications were not reviewed in this encounter     Assessment/Plan:   Medication Management/Asthma: - Currently strategy insufficient to maintain appropriate adherence to prescribed medication regimen - reviewed PAP criteria for [employer]tecares to see if patient may qualify for opzelura  - appears she has previously attempted through Dr MARLA, unsure of outcome, seems she would qualify after conversation today.  - did also review PAP for dupixent  - does not qualify d/t having med d coverage for this specific program.   Follow Up Plan: 2 wk check in, will coordinate opzelura  pap with CPhT. [email or web address]  05/29/2024 update: - spoke with patient and reviewed chart. Just recently had sent back the patient portion of her PAP application for opzelura , PCP signed off and sent out provider section - Inocente, CPhT actively engaged with patient, will send out entire application once patient section received.  - agreed to f/u PRN, patient will let me or the office know of any med related needs.   Lang Sieve, PharmD, BCGP Clinical Pharmacist  [phone]

## 2024-05-30 ENCOUNTER — Other Ambulatory Visit: Payer: Self-pay

## 2024-05-30 ENCOUNTER — Emergency Department (HOSPITAL_COMMUNITY)

## 2024-05-30 ENCOUNTER — Emergency Department (HOSPITAL_COMMUNITY)
Admission: EM | Admit: 2024-05-30 | Discharge: 2024-05-30 | Disposition: A | Discharge status: Home / Self Care | Attending: Emergency Medicine | Admitting: Emergency Medicine

## 2024-05-30 ENCOUNTER — Encounter: Payer: Self-pay | Admitting: Physician Assistant

## 2024-05-30 ENCOUNTER — Ambulatory Visit (INDEPENDENT_AMBULATORY_CARE_PROVIDER_SITE_OTHER): Admitting: Physician Assistant

## 2024-05-30 ENCOUNTER — Ambulatory Visit (HOSPITAL_COMMUNITY)
Admission: RE | Admit: 2024-05-30 | Discharge: 2024-05-30 | Disposition: A | Discharge status: Home / Self Care | Source: Ambulatory Visit | Attending: Physician Assistant | Admitting: Physician Assistant

## 2024-05-30 ENCOUNTER — Encounter (HOSPITAL_COMMUNITY): Payer: Self-pay | Admitting: Emergency Medicine

## 2024-05-30 ENCOUNTER — Telehealth: Payer: Self-pay | Admitting: Physician Assistant

## 2024-05-30 VITALS — BP 128/82 | HR 83 | Temp 97.1°F | Ht 63.0 in | Wt 185.0 lb

## 2024-05-30 DIAGNOSIS — Z87891 Personal history of nicotine dependence: Secondary | ICD-10-CM | POA: Insufficient documentation

## 2024-05-30 DIAGNOSIS — L84 Corns and callosities: Secondary | ICD-10-CM | POA: Insufficient documentation

## 2024-05-30 DIAGNOSIS — I10 Essential (primary) hypertension: Secondary | ICD-10-CM

## 2024-05-30 DIAGNOSIS — R1084 Generalized abdominal pain: Secondary | ICD-10-CM | POA: Insufficient documentation

## 2024-05-30 DIAGNOSIS — E039 Hypothyroidism, unspecified: Secondary | ICD-10-CM

## 2024-05-30 DIAGNOSIS — R1010 Upper abdominal pain, unspecified: Secondary | ICD-10-CM

## 2024-05-30 DIAGNOSIS — M79643 Pain in unspecified hand: Secondary | ICD-10-CM

## 2024-05-30 DIAGNOSIS — Z7951 Long term (current) use of inhaled steroids: Secondary | ICD-10-CM | POA: Insufficient documentation

## 2024-05-30 DIAGNOSIS — Z8616 Personal history of COVID-19: Secondary | ICD-10-CM | POA: Insufficient documentation

## 2024-05-30 DIAGNOSIS — Z79899 Other long term (current) drug therapy: Secondary | ICD-10-CM | POA: Insufficient documentation

## 2024-05-30 DIAGNOSIS — G8929 Other chronic pain: Secondary | ICD-10-CM | POA: Diagnosis not present

## 2024-05-30 DIAGNOSIS — R109 Unspecified abdominal pain: Secondary | ICD-10-CM

## 2024-05-30 DIAGNOSIS — J45909 Unspecified asthma, uncomplicated: Secondary | ICD-10-CM | POA: Diagnosis not present

## 2024-05-30 DIAGNOSIS — R112 Nausea with vomiting, unspecified: Secondary | ICD-10-CM | POA: Diagnosis not present

## 2024-05-30 DIAGNOSIS — K5730 Diverticulosis of large intestine without perforation or abscess without bleeding: Secondary | ICD-10-CM | POA: Diagnosis not present

## 2024-05-30 DIAGNOSIS — R1013 Epigastric pain: Secondary | ICD-10-CM | POA: Diagnosis not present

## 2024-05-30 DIAGNOSIS — E1165 Type 2 diabetes mellitus with hyperglycemia: Secondary | ICD-10-CM | POA: Diagnosis not present

## 2024-05-30 LAB — COMPREHENSIVE METABOLIC PANEL WITH GFR
ALT: 21 U/L (ref 0–44)
AST: 23 U/L (ref 15–41)
Albumin: 4 g/dL (ref 3.5–5.0)
Alkaline Phosphatase: 37 U/L — ABNORMAL LOW (ref 38–126)
Anion gap: 12 (ref 5–15)
BUN: 31 mg/dL — ABNORMAL HIGH (ref 6–20)
CO2: 26 mmol/L (ref 22–32)
Calcium: 9.4 mg/dL (ref 8.9–10.3)
Chloride: 99 mmol/L (ref 98–111)
Creatinine, Ser: 0.88 mg/dL (ref 0.44–1.00)
GFR, Estimated: >60 mL/min (ref >60)
Glucose, Bld: 101 mg/dL — ABNORMAL HIGH (ref 70–99)
Potassium: 4.2 mmol/L (ref 3.5–5.1)
Sodium: 137 mmol/L (ref 135–145)
Total Bilirubin: 0.6 mg/dL (ref 0.0–1.2)
Total Protein: 6.6 g/dL (ref 6.5–8.1)

## 2024-05-30 LAB — CBC WITH DIFFERENTIAL/PLATELET
Abs Immature Granulocytes: 0.04 K/uL (ref 0.00–0.07)
Basophils Absolute: 0 K/uL (ref 0.0–0.1)
Basophils Relative: 0 %
Eosinophils Absolute: 0.1 K/uL (ref 0.0–0.5)
Eosinophils Relative: 1 %
HCT: 42.9 % (ref 36.0–46.0)
Hemoglobin: 14.4 g/dL (ref 12.0–15.0)
Immature Granulocytes: 1 %
Lymphocytes Relative: 40 %
Lymphs Abs: 3.5 K/uL (ref 0.7–4.0)
MCH: 29.1 pg (ref 26.0–34.0)
MCHC: 33.6 g/dL (ref 30.0–36.0)
MCV: 86.8 fL (ref 80.0–100.0)
Monocytes Absolute: 0.7 K/uL (ref 0.1–1.0)
Monocytes Relative: 8 %
Neutro Abs: 4.5 K/uL (ref 1.7–7.7)
Neutrophils Relative %: 50 %
Platelets: 336 K/uL (ref 150–400)
RBC: 4.94 MIL/uL (ref 3.87–5.11)
RDW: 14.1 % (ref 11.5–15.5)
WBC: 8.7 K/uL (ref 4.0–10.5)
nRBC: 0 % (ref 0.0–0.2)

## 2024-05-30 LAB — LIPASE, BLOOD: Lipase: 35 U/L (ref 11–51)

## 2024-05-30 MED ORDER — PROMETHAZINE HCL 25 MG PO TABS: 25.0000 mg | ORAL_TABLET | Freq: Once | ORAL | Status: DC

## 2024-05-30 MED ORDER — ONDANSETRON 4 MG PO TBDP
4.0000 mg | ORAL_TABLET | Freq: Once | ORAL | Status: DC
  Filled 2024-05-30: qty 1

## 2024-05-30 MED ORDER — PROMETHAZINE HCL 25 MG/ML IJ SOLN
25.0000 mg | Freq: Once | INTRAMUSCULAR | Status: AC
  Administered 2024-05-30: 25 mg via INTRAMUSCULAR

## 2024-05-30 MED ORDER — METOCLOPRAMIDE HCL 10 MG PO TABS: 10.0000 mg | ORAL_TABLET | Freq: Four times a day (QID) | ORAL | 0 refills | Status: DC

## 2024-05-30 MED ORDER — PROMETHAZINE HCL 25 MG PO TABS: 25.0000 mg | ORAL_TABLET | Freq: Four times a day (QID) | ORAL | 0 refills | Status: AC | PRN

## 2024-05-30 MED ORDER — METOCLOPRAMIDE HCL 5 MG/ML IJ SOLN
10.0000 mg | Freq: Once | INTRAMUSCULAR | Status: AC
  Administered 2024-05-30: 10 mg via INTRAVENOUS

## 2024-05-30 MED ORDER — ONDANSETRON HCL 4 MG/2ML IJ SOLN
4.0000 mg | Freq: Once | INTRAMUSCULAR | Status: AC
  Administered 2024-05-30: 4 mg via INTRAVENOUS
  Filled 2024-05-30: qty 2

## 2024-05-30 MED ORDER — IOHEXOL 350 MG/ML SOLN
75.0000 mL | Freq: Once | INTRAVENOUS | Status: AC | PRN
  Administered 2024-05-30: 75 mL via INTRAVENOUS

## 2024-05-30 NOTE — Discharge Instructions (Addendum)
 While you were in the emergency room, your blood work done that was normal.  Your imaging that we had done today was normal.  Please follow-up with your primary care doctor to reschedule your MRI.  You may take Reglan  every 6 hours as needed for abdominal pain and nausea.
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
## 2024-05-30 NOTE — Assessment & Plan Note (Signed)
 Has been using Ozempic  to try and help with treatment Has increased belching Manageable at this time Continue to monitor symptoms Continue to use Zofran 

## 2024-05-30 NOTE — Assessment & Plan Note (Signed)
 Cramping continues to increase in severity causing walking difficulties -Continue to monitor symptoms Continue using heat and hydrating

## 2024-05-30 NOTE — ED Triage Notes (Signed)
 Patient arrives by POV for outpatient MRI however states symptoms are worsening and is now checking in as patient. C/o right upper abdominal pain with nausea over past few days.

## 2024-05-30 NOTE — Assessment & Plan Note (Addendum)
 Severe right upper quadrant abdominal pain with nausea and vomiting Suspected chronic pancreatitis despite normal amylase and lipase. Previous gallbladder evaluations unremarkable. - Order MRI to evaluate for chronic pancreatitis and other causes. - Administer Phenergan  injection for nausea. - Conduct stool studies for infections or other gastrointestinal causes. - Advise hospital visit if pain worsens, unable to keep anything down, or fever develops.  Chronic diarrhea and irritable bowel syndrome Chronic diarrhea with IBS, worsened symptoms affecting hydration and nutrition. - Conduct stool studies for infections or other causes. - Continues to have an aversion to food and is admitting to nausea with water

## 2024-05-30 NOTE — Assessment & Plan Note (Signed)
 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 - Refer to dermatologist or podiatrist for evaluation and management.

## 2024-05-30 NOTE — Addendum Note (Signed)
 Addended by: Leamon Palau M on: 05/30/2024 04:44 PM   Modules accepted: Orders

## 2024-05-30 NOTE — Assessment & Plan Note (Signed)
 Previously uncontrolled, now managed with lisinopril  and reduced carvedilol . Current regimen effective. - Continue lisinopril  20 mg daily. - Continue carvedilol  6.25 mg twice daily. BP Readings from Last 3 Encounters:  05/30/24 128/82  05/23/24 113/80  05/22/24 116/70
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
## 2024-05-30 NOTE — Assessment & Plan Note (Signed)
 Fluctuating TSH and T4 levels complicate differentiation between Graves' and Hashimoto's. Awaiting endocrinology consultation. - Order thyroid  stimulating immunoglobulin test. - Contact endocrinology to expedite referral. - Advise follow-up with endocrinology if no response.

## 2024-05-30 NOTE — ED Provider Triage Note (Signed)
 Emergency Medicine Provider Triage Evaluation Note  Laura [name] , a 58 y.o. female  was evaluated in triage.  Pt complains of epigastric abdominal pain with nausea and vomiting.  This has been ongoing a couple months, but seems worse in the past week.  She was scheduled for an MRI outpatient today, but decided to come to the ER due to worsening pain.  Review of Systems  Positive: As above Negative: As above  Physical Exam  BP (!) 122/91 (BP Location: Right Arm)   Pulse 99   Temp 98 F (36.7 C)   Resp 20   SpO2 96%  Gen:   Awake, no distress   Resp:  Normal effort  MSK:   Moves extremities without difficulty    Medical Decision Making  Medically screening exam initiated at 6:38 PM.  Appropriate orders placed.  Baani A Creswell was informed that the remainder of the evaluation will be completed by another provider, this initial triage assessment does not replace that evaluation, and the importance of remaining in the ED until their evaluation is complete.     Veta Palma, PA-C 05/30/24 1839

## 2024-05-30 NOTE — Telephone Encounter (Signed)
 [employer] Nmizm#[id number]

## 2024-05-31 LAB — THYROID STIMULATING IMMUNOGLOBULIN: Thyroid Stim Immunoglobulin: <0.1 IU/L (ref 0.00–0.55)

## 2024-05-31 LAB — PARATHYROID HORMONE, INTACT (NO CA): PTH: 39 pg/mL (ref 15–65)

## 2024-05-31 LAB — MICROALBUMIN / CREATININE URINE RATIO: Microalb Creat Ratio: <30

## 2024-06-01 ENCOUNTER — Ambulatory Visit (HOSPITAL_COMMUNITY)
Admission: RE | Admit: 2024-06-01 | Discharge: 2024-06-01 | Disposition: A | Discharge status: Home / Self Care | Source: Ambulatory Visit | Attending: Physician Assistant | Admitting: Physician Assistant

## 2024-06-01 DIAGNOSIS — R160 Hepatomegaly, not elsewhere classified: Secondary | ICD-10-CM | POA: Diagnosis not present

## 2024-06-01 DIAGNOSIS — R1013 Epigastric pain: Secondary | ICD-10-CM | POA: Diagnosis not present

## 2024-06-01 DIAGNOSIS — R1010 Upper abdominal pain, unspecified: Secondary | ICD-10-CM | POA: Insufficient documentation

## 2024-06-01 MED ORDER — GADOBUTROL 1 MMOL/ML IV SOLN
8.4000 mL | Freq: Once | INTRAVENOUS | Status: AC | PRN
  Administered 2024-06-01: 8.4 mL via INTRAVENOUS

## 2024-06-05 ENCOUNTER — Encounter: Payer: Self-pay | Admitting: Physician Assistant

## 2024-06-05 ENCOUNTER — Ambulatory Visit: Payer: Self-pay | Admitting: Physician Assistant

## 2024-06-05 LAB — CDIFF NAA+O+P+STOOL CULTURE
E coli, Shiga toxin Assay: NEGATIVE
Toxigenic C. Difficile by PCR: NEGATIVE

## 2024-06-05 NOTE — Telephone Encounter (Signed)
 Spoke with rep at [facility] need a pa on file for patient in order to process application

## 2024-06-06 NOTE — Progress Notes (Signed)
   06/06/2024  Patient ID: Laura [name], female   DOB: January 31, 1966, 58 y.o.   MRN: [id number]  Reviewed chart to assess if prior authorization had been submitted for opzelura . Note. 09/2024 PA. Notified Nancy CPhT.   Lang Sieve, PharmD, BCGP Clinical Pharmacist  [phone]

## 2024-06-06 NOTE — Telephone Encounter (Signed)
 Submitted PA response to [employer]

## 2024-06-11 ENCOUNTER — Encounter: Payer: Self-pay | Admitting: Physician Assistant

## 2024-06-12 ENCOUNTER — Telehealth: Payer: Self-pay

## 2024-06-12 ENCOUNTER — Other Ambulatory Visit (HOSPITAL_COMMUNITY): Payer: Self-pay

## 2024-06-12 NOTE — Progress Notes (Unsigned)
   06/12/2024  Patient ID: Laura [name], female   DOB: 01-01-1966, 58 y.o.   MRN: [id number]  Spoke with patient regarding denial from [employer] for opzelura . Denied d/t patient having LIS per programs report. Patient denies having LIS and will bring denial letter into office tomorrow at 10a.  Will pend order to PCP given CPhT saying $0 copay when processed test claim through [facility].  Lang Sieve, PharmD, BCGP Clinical Pharmacist  [phone]

## 2024-06-12 NOTE — Telephone Encounter (Signed)
 PAP: Patient has been denied for patient assistance by [employer] due to pt. Has LIS

## 2024-06-13 ENCOUNTER — Other Ambulatory Visit: Payer: Self-pay

## 2024-06-13 ENCOUNTER — Other Ambulatory Visit (HOSPITAL_COMMUNITY): Payer: Self-pay

## 2024-06-13 ENCOUNTER — Ambulatory Visit

## 2024-06-13 DIAGNOSIS — D8940 Mast cell activation, unspecified: Secondary | ICD-10-CM

## 2024-06-13 MED ORDER — OPZELURA 1.5 % EX CREA
TOPICAL_CREAM | CUTANEOUS | 11 refills | Status: AC
  Filled 2024-06-13: qty 60, 30d supply, fill #0
  Filled 2024-06-13: qty 60, fill #0
  Filled 2024-07-26: qty 60, 30d supply, fill #1
  Filled 2024-09-21: qty 60, 30d supply, fill #2

## 2024-06-13 NOTE — Progress Notes (Signed)
   06/13/2024 Name: TANETTA FUHRIMAN MRN: [id number] DOB: [birth year]  Chief Complaint  Patient presents with   Medication Assistance    Presents for office visit follow-up. Reviewed insurance information related to [facility] plan, per test claim is in catastrophic phase of coverage and copay would be. Rx sent to [facility].  Patient also provided me with LIS Denial letter that we will send to [employer] program. Previously worked with case mgr Dana [phone] ([phone]). Will fax denial letter to [employer]   Lang Sieve, PharmD, BCGP Clinical Pharmacist  [phone]

## 2024-06-14 ENCOUNTER — Other Ambulatory Visit (HOSPITAL_COMMUNITY): Payer: Self-pay

## 2024-06-14 ENCOUNTER — Encounter (HOSPITAL_BASED_OUTPATIENT_CLINIC_OR_DEPARTMENT_OTHER): Payer: Self-pay

## 2024-06-14 ENCOUNTER — Other Ambulatory Visit: Payer: Self-pay

## 2024-06-14 ENCOUNTER — Observation Stay (HOSPITAL_BASED_OUTPATIENT_CLINIC_OR_DEPARTMENT_OTHER)
Admission: EM | Admit: 2024-06-14 | Discharge: 2024-06-15 | Disposition: A | Discharge status: Home / Self Care | Attending: Family Medicine | Admitting: Family Medicine

## 2024-06-14 ENCOUNTER — Emergency Department (HOSPITAL_BASED_OUTPATIENT_CLINIC_OR_DEPARTMENT_OTHER)

## 2024-06-14 DIAGNOSIS — N179 Acute kidney failure, unspecified: Secondary | ICD-10-CM | POA: Diagnosis not present

## 2024-06-14 DIAGNOSIS — Z79899 Other long term (current) drug therapy: Secondary | ICD-10-CM | POA: Diagnosis not present

## 2024-06-14 DIAGNOSIS — I959 Hypotension, unspecified: Secondary | ICD-10-CM | POA: Diagnosis not present

## 2024-06-14 DIAGNOSIS — E119 Type 2 diabetes mellitus without complications: Secondary | ICD-10-CM | POA: Insufficient documentation

## 2024-06-14 DIAGNOSIS — E876 Hypokalemia: Secondary | ICD-10-CM | POA: Diagnosis not present

## 2024-06-14 DIAGNOSIS — Z87891 Personal history of nicotine dependence: Secondary | ICD-10-CM | POA: Insufficient documentation

## 2024-06-14 DIAGNOSIS — I1A0 Resistant hypertension: Secondary | ICD-10-CM | POA: Insufficient documentation

## 2024-06-14 DIAGNOSIS — E039 Hypothyroidism, unspecified: Secondary | ICD-10-CM | POA: Insufficient documentation

## 2024-06-14 DIAGNOSIS — I10 Essential (primary) hypertension: Secondary | ICD-10-CM | POA: Insufficient documentation

## 2024-06-14 DIAGNOSIS — R42 Dizziness and giddiness: Secondary | ICD-10-CM | POA: Diagnosis not present

## 2024-06-14 LAB — COMPREHENSIVE METABOLIC PANEL WITH GFR
ALT: 18 U/L (ref 0–44)
AST: 17 U/L (ref 15–41)
Albumin: 4.2 g/dL (ref 3.5–5.0)
Alkaline Phosphatase: 52 U/L (ref 38–126)
Anion gap: 16 — ABNORMAL HIGH (ref 5–15)
BUN: 32 mg/dL — ABNORMAL HIGH (ref 6–20)
CO2: 24 mmol/L (ref 22–32)
Calcium: 9.5 mg/dL (ref 8.9–10.3)
Chloride: 98 mmol/L (ref 98–111)
Creatinine, Ser: 2.15 mg/dL — ABNORMAL HIGH (ref 0.44–1.00)
GFR, Estimated: 26 mL/min — ABNORMAL LOW (ref >60)
Glucose, Bld: 109 mg/dL — ABNORMAL HIGH (ref 70–99)
Potassium: 4.3 mmol/L (ref 3.5–5.1)
Sodium: 137 mmol/L (ref 135–145)
Total Bilirubin: 0.4 mg/dL (ref 0.0–1.2)
Total Protein: 7 g/dL (ref 6.5–8.1)

## 2024-06-14 LAB — CBC
HCT: 38.4 % (ref 36.0–46.0)
Hemoglobin: 13 g/dL (ref 12.0–15.0)
MCH: 29.3 pg (ref 26.0–34.0)
MCHC: 33.9 g/dL (ref 30.0–36.0)
MCV: 86.5 fL (ref 80.0–100.0)
Platelets: 331 K/uL (ref 150–400)
RBC: 4.44 MIL/uL (ref 3.87–5.11)
RDW: 14.4 % (ref 11.5–15.5)
WBC: 8.1 K/uL (ref 4.0–10.5)
nRBC: 0 % (ref 0.0–0.2)

## 2024-06-14 LAB — URINALYSIS, ROUTINE W REFLEX MICROSCOPIC
Bilirubin Urine: NEGATIVE
Glucose, UA: NEGATIVE mg/dL
Hgb urine dipstick: NEGATIVE
Ketones, ur: NEGATIVE mg/dL
Nitrite: NEGATIVE
Protein, ur: NEGATIVE mg/dL
Specific Gravity, Urine: <=1.005 (ref 1.005–1.030)
pH: 5.5 (ref 5.0–8.0)

## 2024-06-14 LAB — URINALYSIS, MICROSCOPIC (REFLEX)

## 2024-06-14 LAB — BASIC METABOLIC PANEL WITH GFR
Anion gap: 9 (ref 5–15)
BUN: 35 mg/dL — ABNORMAL HIGH (ref 6–20)
CO2: 25 mmol/L (ref 22–32)
Calcium: 8.8 mg/dL — ABNORMAL LOW (ref 8.9–10.3)
Chloride: 102 mmol/L (ref 98–111)
Creatinine, Ser: 1.71 mg/dL — ABNORMAL HIGH (ref 0.44–1.00)
GFR, Estimated: 34 mL/min — ABNORMAL LOW (ref >60)
Glucose, Bld: 112 mg/dL — ABNORMAL HIGH (ref 70–99)
Potassium: 3.9 mmol/L (ref 3.5–5.1)
Sodium: 136 mmol/L (ref 135–145)

## 2024-06-14 LAB — LIPASE, BLOOD: Lipase: 33 U/L (ref 11–51)

## 2024-06-14 LAB — GLUCOSE, CAPILLARY
Glucose-Capillary: 112 mg/dL — ABNORMAL HIGH (ref 70–99)
Glucose-Capillary: 115 mg/dL — ABNORMAL HIGH (ref 70–99)

## 2024-06-14 MED ORDER — LEVOTHYROXINE SODIUM 112 MCG PO TABS: 112.0000 ug | ORAL_TABLET | Freq: Every day | ORAL | Status: DC

## 2024-06-14 MED ORDER — ALPRAZOLAM 0.5 MG PO TABS
1.0000 mg | ORAL_TABLET | Freq: Once | ORAL | Status: AC
  Administered 2024-06-14: 1 mg via ORAL
  Filled 2024-06-14: qty 2

## 2024-06-14 MED ORDER — INSULIN ASPART 100 UNIT/ML IJ SOLN: 0.0000 [IU] | Freq: Every day | INTRAMUSCULAR | Status: DC

## 2024-06-14 MED ORDER — ONDANSETRON HCL 4 MG/2ML IJ SOLN: 4.0000 mg | Freq: Four times a day (QID) | INTRAMUSCULAR | Status: DC | PRN

## 2024-06-14 MED ORDER — CARVEDILOL 6.25 MG PO TABS
6.2500 mg | ORAL_TABLET | Freq: Two times a day (BID) | ORAL | Status: DC
  Administered 2024-06-14 – 2024-06-15 (×2): 6.25 mg via ORAL
  Filled 2024-06-14 (×2): qty 1

## 2024-06-14 MED ORDER — SODIUM CHLORIDE 0.9 % IV SOLN: INTRAVENOUS | Status: DC

## 2024-06-14 MED ORDER — INSULIN ASPART 100 UNIT/ML IJ SOLN: 0.0000 [IU] | Freq: Three times a day (TID) | INTRAMUSCULAR | Status: DC

## 2024-06-14 MED ORDER — PNEUMOCOCCAL 20-VAL CONJ VACC 0.5 ML IM SUSY
0.5000 mL | PREFILLED_SYRINGE | INTRAMUSCULAR | Status: DC
  Filled 2024-06-14: qty 0.5

## 2024-06-14 MED ORDER — LACTATED RINGERS IV BOLUS
1000.0000 mL | Freq: Once | INTRAVENOUS | Status: AC
  Administered 2024-06-14: 1000 mL via INTRAVENOUS

## 2024-06-14 MED ORDER — LEVOTHYROXINE SODIUM 112 MCG PO TABS
112.0000 ug | ORAL_TABLET | Freq: Every day | ORAL | Status: DC
  Administered 2024-06-15: 112 ug via ORAL
  Filled 2024-06-14: qty 1

## 2024-06-14 MED ORDER — ACETAMINOPHEN 325 MG PO TABS
650.0000 mg | ORAL_TABLET | Freq: Four times a day (QID) | ORAL | Status: DC | PRN
  Administered 2024-06-15: 650 mg via ORAL
  Filled 2024-06-14: qty 2

## 2024-06-14 MED ORDER — ZOLPIDEM TARTRATE 5 MG PO TABS
5.0000 mg | ORAL_TABLET | Freq: Every evening | ORAL | Status: DC | PRN
  Administered 2024-06-14: 5 mg via ORAL
  Filled 2024-06-14: qty 1

## 2024-06-14 MED ORDER — HEPARIN SODIUM (PORCINE) 5000 UNIT/ML IJ SOLN
5000.0000 [IU] | Freq: Three times a day (TID) | INTRAMUSCULAR | Status: DC
  Administered 2024-06-14 – 2024-06-15 (×4): 5000 [IU] via SUBCUTANEOUS
  Filled 2024-06-14 (×4): qty 1

## 2024-06-14 MED ORDER — TRAZODONE HCL 50 MG PO TABS: 25.0000 mg | ORAL_TABLET | Freq: Every evening | ORAL | Status: DC | PRN

## 2024-06-14 MED ORDER — ACETAMINOPHEN 650 MG RE SUPP: 650.0000 mg | Freq: Four times a day (QID) | RECTAL | Status: DC | PRN

## 2024-06-14 MED ORDER — ALBUTEROL SULFATE (2.5 MG/3ML) 0.083% IN NEBU: 2.5000 mg | INHALATION_SOLUTION | RESPIRATORY_TRACT | Status: DC | PRN

## 2024-06-14 MED ORDER — ONDANSETRON HCL 4 MG PO TABS: 4.0000 mg | ORAL_TABLET | Freq: Four times a day (QID) | ORAL | Status: DC | PRN

## 2024-06-14 NOTE — ED Notes (Signed)
 Asked patient for urine sample, pt feels that she is not able to urinate at the moment will try again shortly.

## 2024-06-14 NOTE — ED Notes (Addendum)
 Called [facility] for transport to [facility] @12 :05.  Spoke with Infintiy
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
## 2024-06-14 NOTE — ED Provider Notes (Signed)
 [facility] Provider Note   CSN: [id number] Arrival date & time: 06/14/24  9190     Patient presents with: Dizziness   Laura [name] is a 58 y.o. female.   This is a 58 year old female presenting emergency department for lightheadedness.  Reports for the past week blood pressures have been soft.  Notes that she actually syncopized several days ago.  She reports history of thyroid  issues and has had fluctuating blood pressure issues and has been down titrating on blood pressure medications recently.  However for the past week she has been taking her blood pressure medications without checking blood pressure.  This morning she felt particularly lightheaded like she was going to pass out again and checked blood pressure at home and on her home blood pressure cuff was 63 systolic.  On arrival here 96 systolic, reports still feeling somewhat woozy, but not having fevers, chills, URI symptoms, no chest pain, no shortness of breath.  Reports normal p.o. intake.  No dysuria or difficulty urinating   Dizziness      Prior to Admission medications   Medication Sig Start Date End Date Taking? Authorizing Provider  albuterol  (VENTOLIN  HFA) 108 (90 Base) MCG/ACT inhaler Inhale 2 puffs into the lungs every 4 (four) hours as needed for wheezing or shortness of breath. 06/22/19   Tapia, Leisa, PA-C  ALPRAZolam  (XANAX ) 1 MG tablet Take 1 tablet (1 mg total) by mouth 3 (three) times daily. 02/24/24   Milon Cleaves, PA  budeson-glycopyrrolate -formoterol  (BREZTRI  AEROSPHERE) 160-9-4.8 MCG/ACT AERO inhaler Inhale 2 puffs into the lungs 2 (two) times daily. 03/01/24   Milon Cleaves, PA  carvedilol  (COREG ) 6.25 MG tablet Take 1 tablet (6.25 mg total) by mouth 2 (two) times daily with a meal. 04/17/24   Craft, Cleaves, PA  chlorthalidone  (HYGROTON ) 25 MG tablet TAKE ONE TABLET BY MOUTH EVERY DAY 03/27/24   Milon Cleaves, PA  EPINEPHrine  0.3 mg/0.3 mL IJ SOAJ injection Inject  0.3 mLs (0.3 mg total) into the muscle as needed for anaphylaxis. 05/26/20   Abran Jerilynn Loving, MD  Evolocumab  (REPATHA  SURECLICK) 140 MG/ML SOAJ Inject 140 mg into the skin every 14 (fourteen) days.    [provider]  furosemide  (LASIX ) 20 MG tablet Take 1 tablet (20 mg total) by mouth 2 (two) times daily. 02/10/24   Milon Cleaves, PA  glucose blood (ACCU-CHEK GUIDE) test strip 1 each by Other route daily in the afternoon. Use as instructed 04/30/21   Abran Jerilynn Loving, MD  HYDROmorphone  (DILAUDID ) 2 MG tablet Take 1 tablet (2 mg total) by mouth 3 (three) times daily as needed for moderate pain (pain score 4-6) (pains score 4-6). 05/23/24   Lovorn, Megan, MD  ipratropium-albuterol  (DUONEB) 0.5-2.5 (3) MG/3ML SOLN Take 3 mLs by nebulization every 4 (four) hours as needed. 08/19/21   Charlene Clotilda PARAS, NP  levothyroxine  (SYNTHROID ) 112 MCG tablet TAKE ONE TABLET BY MOUTH EVERY DAY 03/19/24   Milon Cleaves, PA  lisinopril  (ZESTRIL ) 20 MG tablet Take 1 tablet (20 mg total) by mouth daily. 04/03/24 07/02/24  Darron Deatrice LABOR, MD  methylPREDNISolone  (MEDROL ) 4 MG tablet Take 1 tablet (4 mg total) by mouth daily. 04/30/24   Milon Cleaves, PA  metoCLOPramide  (REGLAN ) 10 MG tablet Take 1 tablet (10 mg total) by mouth every 6 (six) hours. 05/30/24   Mannie Pac T, DO  nystatin  (MYCOSTATIN ) 100000 UNIT/ML suspension Take 5 mLs (500,000 Units total) by mouth 4 (four) times daily. 02/27/24  Cox, Kirsten, MD  nystatin  powder Apply 1 Application topically 2 (two) times daily.    [provider]  ondansetron  (ZOFRAN -ODT) 4 MG disintegrating tablet Take 1 tablet (4 mg total) by mouth every 8 (eight) hours as needed for nausea or vomiting. 04/30/24   Milon Cleaves, PA  potassium chloride  (KLOR-CON  M) 10 MEQ tablet Take 1 tablet (10 mEq total) by mouth 2 (two) times daily. 04/19/24   Milon Cleaves, PA  potassium chloride  SA (KLOR-CON  M) 20 MEQ tablet Take 1 tablet (20 mEq total) by mouth 2 (two) times  daily. 04/19/24   Milon Cleaves, PA  promethazine  (PHENERGAN ) 25 MG tablet Take 1 tablet (25 mg total) by mouth every 6 (six) hours as needed for nausea or vomiting. 05/30/24   Mannie Pac T, DO  Ruxolitinib  Phosphate (OPZELURA ) 1.5 % CREA Apply sparingly to affected areas twice daily if needed 12/14/23   Kozlow, Eric J, MD  Ruxolitinib  Phosphate (OPZELURA ) 1.5 % CREA Apply sparingly twice daily if needed 06/13/24   Milon Cleaves, PA  Semaglutide ,0.25 or 0.5MG /DOS, (OZEMPIC , 0.25 OR 0.5 MG/DOSE,) 2 MG/3ML SOPN Inject 0.25 mg into the skin once a week 05/22/24   Milon Cleaves, PA  tiZANidine  (ZANAFLEX ) 2 MG tablet TAKE FOUR TABLETS BY MOUTH TWICE DAILY FOR MUSCLE SPASMS 03/19/24   Lovorn, Megan, MD  tiZANidine  (ZANAFLEX ) 4 MG tablet Take 2 tablets (8 mg total) by mouth 2 (two) times daily. For muscle spasms 02/11/23   Lovorn, Megan, MD  Ubrogepant  (UBRELVY ) 100 MG TABS Take 1 tablet (100 mg total) by mouth 2 (two) times daily as needed (Take one and if symptoms persist after 2 hours take a second one. Do not take more than 2 in 24 hours). 09/13/23   Milon Cleaves, PA  Vitamin D , Ergocalciferol , (DRISDOL ) 1.25 MG (50000 UNIT) CAPS capsule Take 1 capsule (50,000 Units total) by mouth every 7 (seven) days. 05/28/24   Milon Cleaves, PA  Vonoprazan Fumarate  (VOQUEZNA ) 10 MG TABS Take 10 mg by mouth daily. 04/25/24   Milon Cleaves, PA  zolpidem  (AMBIEN ) 10 MG tablet Take 1 tablet (10 mg total) by mouth at bedtime. 05/10/24   Milon Cleaves, PA    Allergies: Meperidine, Prednisone , Shellfish allergy , Shellfish-derived products, Diltiazem , Singulair  [montelukast ], Zetia  [ezetimibe ], Acebutolol , Amlodipine , Celebrex  [celecoxib ], Dexlansoprazole , Dronedarone , Duloxetine  hcl, Flecainide , Lisinopril , Metoprolol , Mexiletine, Omeprazole , Pregabalin , Savella [milnacipran], Sectral  [acebutolol  hcl], Statins, Tramadol , Valsartan , Codeine, Hydralazine , Hydrocodone , Ketorolac  tromethamine , Losartan , Mirtazapine , and Oxycodone      Review of Systems  Neurological:  Positive for dizziness.    Updated Vital Signs BP 114/75   Pulse 69   Temp 97.6 F (36.4 C) (Oral)   Resp 18   Ht 5' 3 (1.6 m)   Wt 84 kg   SpO2 98%   BMI 32.81 kg/m   Physical Exam Vitals and nursing note reviewed.  Constitutional:      General: She is not in acute distress.    Appearance: She is not toxic-appearing.  HENT:     Head: Normocephalic.     Nose: Nose normal.     Mouth/Throat:     Mouth: Mucous membranes are moist.  Eyes:     Conjunctiva/sclera: Conjunctivae normal.  Cardiovascular:     Rate and Rhythm: Normal rate and regular rhythm.  Pulmonary:     Effort: Pulmonary effort is normal.  Abdominal:     General: Abdomen is flat. There is no distension.     Tenderness: There is no abdominal tenderness. There is no guarding or rebound.  Musculoskeletal:     Right lower leg: No edema.     Left lower leg: No edema.  Skin:    General: Skin is warm.     Capillary Refill: Capillary refill takes less than 2 seconds.  Neurological:     Mental Status: She is alert and oriented to person, place, and time.  Psychiatric:        Mood and Affect: Mood normal.        Behavior: Behavior normal.     (all labs ordered are listed, but only abnormal results are displayed) Labs Reviewed  COMPREHENSIVE METABOLIC PANEL WITH GFR - Abnormal; Notable for the following components:      Result Value   Glucose, Bld 109 (*)    BUN 32 (*)    Creatinine, Ser 2.15 (*)    GFR, Estimated 26 (*)    Anion gap 16 (*)    All other components within normal limits  URINALYSIS, ROUTINE W REFLEX MICROSCOPIC - Abnormal; Notable for the following components:   Leukocytes,Ua TRACE (*)    All other components within normal limits  URINALYSIS, MICROSCOPIC (REFLEX) - Abnormal; Notable for the following components:   Bacteria, UA FEW (*)    All other components within normal limits  CBC  LIPASE, BLOOD  BASIC METABOLIC PANEL WITH GFR    EKG: EKG  Interpretation Date/Time:  Thursday June 14 2024 08:29:48 EDT Ventricular Rate:  73 PR Interval:  150 QRS Duration:  97 QT Interval:  391 QTC Calculation: 431 R Axis:   -33  Text Interpretation: Sinus rhythm Left axis deviation Low voltage, precordial leads Minimal ST elevation, lateral leads Confirmed by Neysa Clap [phone]) on 06/14/2024 8:58:37 AM  Radiology: [facility] Chest 2 View Result Date: 06/14/2024 CLINICAL DATA:  hypotension EXAM: CHEST - 2 VIEW COMPARISON:  February 29, 2024 FINDINGS: No focal airspace consolidation, pleural effusion, or pneumothorax. No cardiomegaly.No acute fracture or destructive lesion. Multilevel thoracic osteophytosis. IMPRESSION: No acute cardiopulmonary abnormality. Electronically Signed   By: Rogelia Myers M.D.   On: 06/14/2024 10:21     Ultrasound ED Echo  Date/Time: 06/14/2024 10:00 AM  Performed by: Neysa Clap PARAS, DO Authorized by: Neysa Clap PARAS, DO   Procedure details:    Indications: hypotension     Views: parasternal long axis view, parasternal short axis view, apical 4 chamber view and IVC view     Images: not archived     Limitations:  Body habitus and acoustic shadowing Findings:    Pericardium: no pericardial effusion     LV Function: normal (>50% EF)     RV Diameter: normal     IVC: collapsed   Impression:    Impression: normal   Ultrasound ED Renal  Date/Time: 06/14/2024 10:01 AM  Performed by: Neysa Clap PARAS, DO Authorized by: Neysa Clap PARAS, DO   Procedure details:    Indications: acute renal injury     Technique:  L kidney and R kidneyImages: not archived Left kidney findings:    Mass: not identified     Nephrolithiasis: not identified     Renal stones: not identified     Hydronephrosis: none   Right kidney findings:    Mass: not identified     Nephrolithiasis: not identified     Renal stones: not identified     Hydronephrosis: none      Medications Ordered in the ED  levothyroxine  (SYNTHROID ) tablet 112 mcg  (has no administration in time range)  carvedilol  (COREG ) tablet 6.25 mg (  has no administration in time range)  acetaminophen  (TYLENOL ) tablet 650 mg (has no administration in time range)    Or  acetaminophen  (TYLENOL ) suppository 650 mg (has no administration in time range)  traZODone  (DESYREL ) tablet 25 mg (has no administration in time range)  ondansetron  (ZOFRAN ) tablet 4 mg (has no administration in time range)    Or  ondansetron  (ZOFRAN ) injection 4 mg (has no administration in time range)  albuterol  (PROVENTIL ) (2.5 MG/3ML) 0.083% nebulizer solution 2.5 mg (has no administration in time range)  heparin  injection 5,000 Units (has no administration in time range)  insulin  aspart (novoLOG ) injection 0-15 Units (has no administration in time range)  insulin  aspart (novoLOG ) injection 0-5 Units (has no administration in time range)  0.9 %  sodium chloride  infusion (has no administration in time range)  pneumococcal 20-valent conjugate vaccine (PREVNAR 20 ) injection 0.5 mL (has no administration in time range)  lactated ringers  bolus 1,000 mL (0 mLs Intravenous Stopped 06/14/24 0952)  lactated ringers  bolus 1,000 mL (0 mLs Intravenous Stopped 06/14/24 1139)  ALPRAZolam  (XANAX ) tablet 1 mg (1 mg Oral Given 06/14/24 1244)    Clinical Course as of 06/14/24 1433  Thu Jun 14, 2024  0919 CT abd w contrast on 7/30: IMPRESSION: 1. No acute localizing process in the abdomen or pelvis. 2. Sigmoid colon diverticulosis without evidence for diverticulitis. 3. Aortic atherosclerosis.  [TY]  0920 Creatinine(!): 2.15 Consistent with AKI. 0.8 2 weeks ago.  [TY]  0940 Echo in 2023 per my chart review with normal EF. [TY]    Clinical Course User Index [TY] Neysa Caron PARAS, DO                                 Medical Decision Making This is a 58 year old female with complex past medical history to include hypertension, hypothyroid, diabetes, fibromyalgia presenting emergency department with  lightheadedness.  Blood pressures remain soft, but is maintaining maps greater than 65 and is largely asymptomatic.  Mentating well.  Low suspicion for infectious process given no tachycardia or fever.  No leukocytosis either.  I suspect multifactorial dehydration versus iatrogenic secondary to blood pressure medications.  Bedside ultrasound reassuring with no tamponade physiology, no risk for PE, no right atrial dilation or D sign on ultrasound.  Her IVC small with respiratory variation suggesting hypovolemia.  Additionally has AKI.  AKI secondary to hypotension versus medication, could also have contrast for CT scan on 7/30.  CIN?  Given IV fluids here in the emergency department.  Given the degree of her creatinine elevation with her soft blood pressures we will admit for observation and further management of her AKI  Amount and/or Complexity of Data Reviewed Independent Historian:     Details: Notes that mother does take Lasix  for lower extremity edema External Data Reviewed:     Details: See ED course Labs: ordered. Decision-making details documented in ED Course. Radiology: ordered and independent interpretation performed.    Details: X-ray without pneumonia or pneumothorax on my independent interpretation ECG/medicine tests: independent interpretation performed.    Details: Sinus rhythm.  No STEMI Discussion of management or test interpretation with external provider(s): Hospitalist for admission  Risk Prescription drug management. Decision regarding hospitalization.       Final diagnoses:  AKI (acute kidney injury) [facility])    ED Discharge Orders     None          Neysa Caron PARAS, DO 06/14/24 1433

## 2024-06-14 NOTE — ED Triage Notes (Signed)
 Pt presents with complaints of dizziness/lightheadedness & hypotension X 1 week. Also states she had LOC last week but wasn't seen. Also endorses nausea.   Check BP this morning and it was 63/36

## 2024-06-14 NOTE — Plan of Care (Signed)
   Problem: Education: Goal: Knowledge of General Education information will improve Description Including pain rating scale, medication(s)/side effects and non-pharmacologic comfort measures Outcome: Progressing

## 2024-06-15 DIAGNOSIS — E039 Hypothyroidism, unspecified: Secondary | ICD-10-CM | POA: Diagnosis not present

## 2024-06-15 DIAGNOSIS — I10 Essential (primary) hypertension: Secondary | ICD-10-CM | POA: Diagnosis not present

## 2024-06-15 DIAGNOSIS — E119 Type 2 diabetes mellitus without complications: Secondary | ICD-10-CM | POA: Diagnosis not present

## 2024-06-15 DIAGNOSIS — E876 Hypokalemia: Secondary | ICD-10-CM | POA: Diagnosis not present

## 2024-06-15 DIAGNOSIS — N179 Acute kidney failure, unspecified: Secondary | ICD-10-CM | POA: Diagnosis not present

## 2024-06-15 LAB — BASIC METABOLIC PANEL WITH GFR
Anion gap: 8 (ref 5–15)
BUN: 30 mg/dL — ABNORMAL HIGH (ref 6–20)
CO2: 26 mmol/L (ref 22–32)
Calcium: 9.1 mg/dL (ref 8.9–10.3)
Chloride: 105 mmol/L (ref 98–111)
Creatinine, Ser: 1.19 mg/dL — ABNORMAL HIGH (ref 0.44–1.00)
GFR, Estimated: 53 mL/min — ABNORMAL LOW (ref >60)
Glucose, Bld: 110 mg/dL — ABNORMAL HIGH (ref 70–99)
Potassium: 3.7 mmol/L (ref 3.5–5.1)
Sodium: 139 mmol/L (ref 135–145)

## 2024-06-15 LAB — CBC
HCT: 36.1 % (ref 36.0–46.0)
Hemoglobin: 12.1 g/dL (ref 12.0–15.0)
MCH: 29.4 pg (ref 26.0–34.0)
MCHC: 33.5 g/dL (ref 30.0–36.0)
MCV: 87.8 fL (ref 80.0–100.0)
Platelets: 322 K/uL (ref 150–400)
RBC: 4.11 MIL/uL (ref 3.87–5.11)
RDW: 14 % (ref 11.5–15.5)
WBC: 4.3 K/uL (ref 4.0–10.5)
nRBC: 0 % (ref 0.0–0.2)

## 2024-06-15 LAB — HIV ANTIBODY (ROUTINE TESTING W REFLEX): HIV Screen 4th Generation wRfx: NONREACTIVE

## 2024-06-15 LAB — GLUCOSE, CAPILLARY
Glucose-Capillary: 100 mg/dL — ABNORMAL HIGH (ref 70–99)
Glucose-Capillary: 120 mg/dL — ABNORMAL HIGH (ref 70–99)

## 2024-06-15 MED ORDER — TIZANIDINE HCL 4 MG PO TABS: 8.0000 mg | ORAL_TABLET | Freq: Two times a day (BID) | ORAL | Status: DC

## 2024-06-15 MED ORDER — BUDESON-GLYCOPYRROL-FORMOTEROL 160-9-4.8 MCG/ACT IN AERO
2.0000 | INHALATION_SPRAY | Freq: Two times a day (BID) | RESPIRATORY_TRACT | Status: DC
  Filled 2024-06-15: qty 5.9

## 2024-06-15 MED ORDER — FUROSEMIDE 20 MG PO TABS: 20.0000 mg | ORAL_TABLET | Freq: Two times a day (BID) | ORAL | Status: DC

## 2024-06-15 MED ORDER — IPRATROPIUM-ALBUTEROL 0.5-2.5 (3) MG/3ML IN SOLN: 3.0000 mL | RESPIRATORY_TRACT | Status: DC | PRN

## 2024-06-15 MED ORDER — VONOPRAZAN FUMARATE 10 MG PO TABS: 10.0000 mg | ORAL_TABLET | Freq: Every day | ORAL | Status: DC

## 2024-06-15 MED ORDER — POTASSIUM CHLORIDE CRYS ER 20 MEQ PO TBCR: 40.0000 meq | EXTENDED_RELEASE_TABLET | Freq: Three times a day (TID) | ORAL | Status: DC

## 2024-06-15 MED ORDER — CHLORTHALIDONE 25 MG PO TABS
25.0000 mg | ORAL_TABLET | Freq: Every day | ORAL | Status: DC
  Filled 2024-06-15: qty 1

## 2024-06-15 NOTE — Care Management Obs Status (Signed)
 MEDICARE OBSERVATION STATUS NOTIFICATION   Patient Details  Name: Laura [name] MRN: [id number] Date of Birth: 10/14/1966   Medicare Observation Status Notification Given:  Yes    Duwaine GORMAN Aran, LCSW 06/15/2024, 1:03 PM

## 2024-06-15 NOTE — Plan of Care (Signed)

## 2024-06-15 NOTE — Discharge Summary (Signed)
 Physician Discharge Summary   Patient: Laura [name] MRN: [id number] DOB: August 14, 1966  Admit date:     06/14/2024  Discharge date: 06/15/24  Discharge Physician: Toribio Door   PCP: Milon Cleaves, PA   Recommendations at discharge:  See discussion below in regard to acute kidney injury and high blood pressure.  Recommend per discontinuation of ACE inhibitor.  May need to consider other agents for blood pressure if becomes elevated again.  Discharge Diagnoses: Principal Problem:   AKI (acute kidney injury) (HCC)  Hypertension Chronic hypokalemia Type 2 diabetes Hypothyroidism  Hospital Course: 58 year old woman presenting with fatigue, intermittent dizziness, syncope within a week or so of admission, hypotension.  Admitted for acute kidney injury.  Condition rapidly improved with IV fluids and temporary withholding of diuretics.  In discussion with patient, she is quite knowledgeable about her health history, this appears to be related to institution of lisinopril  within the last 2 months.  She did not tolerate this medication well many years ago.  She insisted on resuming her diuretics given chronic lower extremity edema from a young age and known family history.  Blood pressure has been high at home but she attributes this to her thyroid  which has been difficult to control as of late.  We agreed to plan of continuing diuretics and carvedilol  and discontinuing lisinopril .  She will discuss with her PCP on Monday for further recommendations.  Consultants None   Procedures/Events None   Acute kidney injury Creatinine 2.15 with baseline renal function creatinine of 0.8 just 2 weeks ago.   Electrolytes normal, urinalysis unremarkable.   Possibly ATN from symptomatic hypotension versus adverse drug reaction to lisinopril .  Favor lisinopril . Responded very well to fluids with marked improvement in renal function.   Can resume Lasix  chlorthalidone  carvedilol  on discharge.  She will  monitor her blood pressures.  Interesting to note that she is on both Lasix  and chlorthalidone , but given multiple allergies to medications and history of chronic lower extremity edema, will defer to the physician seen in the outpatient setting on our best. It would seem prudent to avoid all ACE inhibitors in the future.   Hypertension Blood pressure generally well-controlled, low normal here off antihypertensives except carvedilol . As above stop lisinopril  otherwise continue carvedilol  Lasix  and chlorthalidone .  Patient will monitor her blood pressure and follow-up next week.   Chronic hypokalemia Iatrogenic due to diuretic use, she typically takes 40 mg p.o. Lasix  twice daily, and supplements with 40 mill equivalents potassium chloride  3 times daily.   Resume on discharge   Type 2 diabetes Carb modified diet, stable.   Hypothyroidism Continue Synthroid   Disposition: Home Diet recommendation:  Discharge Diet Orders (From admission, onward)     Start     Ordered   06/15/24 0000  Diet - low sodium heart healthy        06/15/24 1539           Regular diet DISCHARGE MEDICATION: Allergies as of 06/15/2024       Reactions   Meperidine Hives, Rash   Prednisone  Anxiety, Other (See Comments)   Severe high anxiety   Shellfish Allergy  Shortness Of Breath, Swelling   Shellfish-derived Products Anaphylaxis   Diltiazem  Swelling   Singulair  [montelukast ] Swelling   Swelling all over.    Zetia  [ezetimibe ] Swelling   Swelling of face.    Acebutolol  Swelling   Amlodipine  Swelling      Celebrex  [celecoxib ] Swelling   Patient began taking for knee pain and started swelling (hands, feet,  face).    Dexlansoprazole  Other (See Comments), Nausea And Vomiting   Abdominal pain   Dronedarone  Swelling, Other (See Comments)   Duloxetine  Hcl Other (See Comments)   Made pt feel crazy   Flecainide  Swelling   Lisinopril  Other (See Comments)   Swelling   Metoprolol  Other (See Comments),  Swelling   Mexiletine    Swelling - hands, legs, face   Omeprazole  Other (See Comments), Nausea And Vomiting   Abdominal pain   Pregabalin  Other (See Comments)   twitch   Savella [milnacipran]    Depression   Sectral  [acebutolol  Hcl] Swelling   Statins Other (See Comments)   Muscle pain   Tramadol  Nausea Only   Unable to sleep, makes her itch   Valsartan  Swelling   malaise, fatigue, swelling.   Codeine Hives, Nausea And Vomiting, Rash   Hydralazine  Palpitations   Hydrocodone  Other (See Comments), Rash   Keeps patient awake.   Ketorolac  Tromethamine  Itching, Rash   Losartan  Rash   Swelling   Mirtazapine  Swelling, Rash   Oxycodone  Itching, Rash        Medication List     STOP taking these medications    lisinopril  20 MG tablet Commonly known as: ZESTRIL    metoCLOPramide  10 MG tablet Commonly known as: REGLAN    nystatin  100000 UNIT/ML suspension Commonly known as: MYCOSTATIN        TAKE these medications    Accu-Chek Guide test strip Generic drug: glucose blood 1 each by Other route daily in the afternoon. Use as instructed   albuterol  108 (90 Base) MCG/ACT inhaler Commonly known as: VENTOLIN  HFA Inhale 2 puffs into the lungs every 4 (four) hours as needed for wheezing or shortness of breath.   ALPRAZolam  1 MG tablet Commonly known as: XANAX  Take 1 tablet (1 mg total) by mouth 3 (three) times daily. What changed:  when to take this reasons to take this   Breztri  Aerosphere 160-9-4.8 MCG/ACT Aero inhaler Generic drug: budesonide -glycopyrrolate -formoterol  Inhale 2 puffs into the lungs 2 (two) times daily. What changed:  when to take this reasons to take this   carvedilol  6.25 MG tablet Commonly known as: COREG  Take 1 tablet (6.25 mg total) by mouth 2 (two) times daily with a meal.   chlorthalidone  25 MG tablet Commonly known as: HYGROTON  TAKE ONE TABLET BY MOUTH EVERY DAY   EPINEPHrine  0.3 mg/0.3 mL Soaj injection Commonly known as:  EPI-PEN Inject 0.3 mLs (0.3 mg total) into the muscle as needed for anaphylaxis.   furosemide  20 MG tablet Commonly known as: LASIX  Take 1 tablet (20 mg total) by mouth 2 (two) times daily. What changed: when to take this   HYDROmorphone  2 MG tablet Commonly known as: DILAUDID  Take 1 tablet (2 mg total) by mouth 3 (three) times daily as needed for moderate pain (pain score 4-6) (pains score 4-6).   ipratropium-albuterol  0.5-2.5 (3) MG/3ML Soln Commonly known as: DUONEB Take 3 mLs by nebulization every 4 (four) hours as needed. What changed: reasons to take this   levothyroxine  112 MCG tablet Commonly known as: SYNTHROID  TAKE ONE TABLET BY MOUTH EVERY DAY What changed: when to take this   methylPREDNISolone  4 MG tablet Commonly known as: MEDROL  Take 1 tablet (4 mg total) by mouth daily.   nystatin  powder Apply 1 Application topically 2 (two) times daily as needed (for skin irritation- affected areas).   ondansetron  4 MG disintegrating tablet Commonly known as: ZOFRAN -ODT Take 1 tablet (4 mg total) by mouth every 8 (eight) hours as  needed for nausea or vomiting. What changed: reasons to take this   Opzelura  1.5 % Crea Generic drug: Ruxolitinib  Phosphate Apply sparingly twice daily if needed What changed:  how much to take how to take this when to take this additional instructions Another medication with the same name was removed. Continue taking this medication, and follow the directions you see here.   Ozempic  (0.25 or 0.5 MG/DOSE) 2 MG/3ML Sopn Generic drug: Semaglutide (0.25 or 0.5MG /DOS) Inject 0.25 mg into the skin once a week What changed:  how much to take how to take this when to take this additional instructions   potassium chloride  SA 20 MEQ tablet Commonly known as: KLOR-CON  M Take 1 tablet (20 mEq total) by mouth 2 (two) times daily. What changed:  how much to take when to take this Another medication with the same name was removed. Continue taking  this medication, and follow the directions you see here.   promethazine  25 MG tablet Commonly known as: PHENERGAN  Take 1 tablet (25 mg total) by mouth every 6 (six) hours as needed for nausea or vomiting.   Repatha  SureClick 140 MG/ML Soaj Generic drug: Evolocumab  Inject 140 mg into the skin every 14 (fourteen) days.   tiZANidine  2 MG tablet Commonly known as: ZANAFLEX  TAKE FOUR TABLETS BY MOUTH TWICE DAILY FOR MUSCLE SPASMS What changed:  See the new instructions. Another medication with the same name was removed. Continue taking this medication, and follow the directions you see here.   Ubrelvy  100 MG Tabs Generic drug: Ubrogepant  Take 1 tablet (100 mg total) by mouth 2 (two) times daily as needed (Take one and if symptoms persist after 2 hours take a second one. Do not take more than 2 in 24 hours).   Vitamin D  (Ergocalciferol ) 1.25 MG (50000 UNIT) Caps capsule Commonly known as: DRISDOL  Take 1 capsule (50,000 Units total) by mouth every 7 (seven) days. What changed: when to take this   Voquezna  10 MG Tabs Generic drug: Vonoprazan Fumarate  Take 10 mg by mouth daily.   zolpidem  10 MG tablet Commonly known as: AMBIEN  Take 1 tablet (10 mg total) by mouth at bedtime.       Feels better, walking the halls, wants to go home.  Discharge Exam: Filed Weights   06/14/24 1425  Weight: 84 kg   Physical Exam Vitals reviewed.  Constitutional:      General: She is not in acute distress.    Appearance: She is not ill-appearing or toxic-appearing.  Cardiovascular:     Rate and Rhythm: Normal rate and regular rhythm.     Heart sounds: No murmur heard. Pulmonary:     Effort: Pulmonary effort is normal. No respiratory distress.     Breath sounds: No wheezing, rhonchi or rales.  Neurological:     Mental Status: She is alert.  Psychiatric:        Mood and Affect: Mood normal.        Behavior: Behavior normal.      Condition at discharge: good  The results of significant  diagnostics from this hospitalization (including imaging, microbiology, ancillary and laboratory) are listed below for reference.   Imaging Studies: DG Chest 2 View Result Date: 06/14/2024 CLINICAL DATA:  hypotension EXAM: CHEST - 2 VIEW COMPARISON:  February 29, 2024 FINDINGS: No focal airspace consolidation, pleural effusion, or pneumothorax. No cardiomegaly.No acute fracture or destructive lesion. Multilevel thoracic osteophytosis. IMPRESSION: No acute cardiopulmonary abnormality. Electronically Signed   By: Rogelia Myers M.D.   On: 06/14/2024  10:21   MR Abdomen W Wo Contrast Result Date: 06/01/2024 CLINICAL DATA:  Epigastric pain EXAM: MRI ABDOMEN WITHOUT AND WITH CONTRAST TECHNIQUE: Multiplanar multisequence MR imaging of the abdomen was performed both before and after the administration of intravenous contrast. CONTRAST:  8.4mL GADAVIST  GADOBUTROL  1 MMOL/ML IV SOLN COMPARISON:  CT abdomen pelvis May 30, 2024 FINDINGS: Lower chest: No acute findings. Hepatobiliary: Mild hepatomegaly measuring 19.1 cm in craniocaudal dimension. No focal liver lesion. No significant hepatic steatosis. Pancreas: Fatty infiltration of the pancreas. No pancreatic lesion or ductal dilation. Spleen:  Within normal limits in size and appearance. Adrenals/Urinary Tract: No enhancing masses identified. No evidence of hydronephrosis. A few bilateral punctate T2 hyperintense simple renal cortical cysts which does not require imaging follow-up. Stomach/Bowel: Visualized portions within the abdomen are unremarkable. Vascular/Lymphatic: No pathologically enlarged lymph nodes identified. No abdominal aortic aneurysm demonstrated. Other:  None. Musculoskeletal: No suspicious lesion. IMPRESSION: Fatty infiltration of the pancreas. Hepatomegaly without significant hepatic steatosis or focal liver lesion. Correlate with liver function tests. Electronically Signed   By: Megan  Zare M.D.   On: 06/01/2024 18:29   MM 3D SCREENING MAMMOGRAM  BILATERAL BREAST Result Date: 05/31/2024 CLINICAL DATA:  Screening. EXAM: DIGITAL SCREENING BILATERAL MAMMOGRAM WITH TOMOSYNTHESIS AND CAD TECHNIQUE: Bilateral screening digital craniocaudal and mediolateral oblique mammograms were obtained. Bilateral screening digital breast tomosynthesis was performed. The images were evaluated with computer-aided detection. COMPARISON:  Previous exam(s). ACR Breast Density Category c: The breasts are heterogeneously dense, which may obscure small masses. FINDINGS: There are no findings suspicious for malignancy. IMPRESSION: No mammographic evidence of malignancy. A result letter of this screening mammogram will be mailed directly to the patient. RECOMMENDATION: Screening mammogram in one year. (Code:[id number]-B-01Y) BI-RADS CATEGORY  1: Negative. Electronically Signed   By: Debby Satterfield M.D.   On: 05/31/2024 16:18   CT ABDOMEN PELVIS W CONTRAST Result Date: 05/30/2024 CLINICAL DATA:  Epigastric abdominal pain.  Nausea and vomiting. EXAM: CT ABDOMEN AND PELVIS WITH CONTRAST TECHNIQUE: Multidetector CT imaging of the abdomen and pelvis was performed using the standard protocol following bolus administration of intravenous contrast. RADIATION DOSE REDUCTION: This exam was performed according to the departmental dose-optimization program which includes automated exposure control, adjustment of the mA and/or kV according to patient size and/or use of iterative reconstruction technique. CONTRAST:  75mL OMNIPAQUE  IOHEXOL  350 MG/ML SOLN COMPARISON:  CT abdomen and pelvis 01/12/2024. FINDINGS: Lower chest: No acute abnormality. Hepatobiliary: No focal liver abnormality is seen. No gallstones, gallbladder wall thickening, or biliary dilatation. Pancreas: Unremarkable. No pancreatic ductal dilatation or surrounding inflammatory changes. Spleen: Normal in size without focal abnormality. Adrenals/Urinary Tract: Adrenal glands are unremarkable. Kidneys are normal, without renal calculi, focal  lesion, or hydronephrosis. Bladder is unremarkable. Stomach/Bowel: Stomach is within normal limits. No evidence of bowel wall thickening, distention, or inflammatory changes. There is scattered sigmoid colon diverticula. The appendix is not visualized. Vascular/Lymphatic: Aortic atherosclerosis. No enlarged abdominal or pelvic lymph nodes. Reproductive: Status post hysterectomy. No adnexal masses. Other: No abdominal wall hernia or abnormality. No abdominopelvic ascites. Musculoskeletal: No acute or significant osseous findings. IMPRESSION: 1. No acute localizing process in the abdomen or pelvis. 2. Sigmoid colon diverticulosis without evidence for diverticulitis. 3. Aortic atherosclerosis. Aortic Atherosclerosis (ICD10-I70.0).  A Electronically Signed   By: Greig Pique M.D.   On: 05/30/2024 19:39    Microbiology: Results for orders placed or performed in visit on 05/30/24  Cdiff NAA+O+P+Stool Culture     Status: None   Collection Time: 05/31/24  4:09 PM   Specimen: Stool   ST  Result Value Ref Range Status   Salmonella/Shigella Screen Final report  Final   Stool Culture result 1 (RSASHR) Comment  Final    Comment: No Salmonella or Shigella recovered.   Campylobacter Culture Final report  Final   Stool Culture result 1 (CMPCXR) Comment  Final    Comment: No Campylobacter species isolated.   E coli, Shiga toxin Assay Negative Negative Final   OVA + PARASITE EXAM Final report  Final    Comment: These results were obtained using wet preparation(s) and trichrome stained smear. This test does not include testing for Cryptosporidium parvum, Cyclospora, or Microsporidia.    O&P result 1 Comment  Final    Comment: No ova, cysts, or parasites seen. One negative specimen does not rule out the possibility of a parasitic infection.    Toxigenic C. Difficile by PCR Negative Negative Final   *Note: Due to a large number of results and/or encounters for the requested time period, some results have  not been displayed. A complete set of results can be found in Results Review.    Labs: CBC: Recent Labs  Lab 06/14/24 0829 06/15/24 0438  WBC 8.1 4.3  HGB 13.0 12.1  HCT 38.4 36.1  MCV 86.5 87.8  PLT 331 322   Basic Metabolic Panel: Recent Labs  Lab 06/14/24 0832 06/14/24 1429 06/15/24 0438  NA 137 136 139  K 4.3 3.9 3.7  CL 98 102 105  CO2 24 25 26   GLUCOSE 109* 112* 110*  BUN 32* 35* 30*  CREATININE 2.15* 1.71* 1.19*  CALCIUM  9.5 8.8* 9.1   Liver Function Tests: Recent Labs  Lab 06/14/24 0832  AST 17  ALT 18  ALKPHOS 52  BILITOT 0.4  PROT 7.0  ALBUMIN 4.2   CBG: Recent Labs  Lab 06/14/24 1640 06/14/24 2118 06/15/24 0731 06/15/24 1149  GLUCAP 112* 115* 100* 120*    Discharge time spent: greater than 30 minutes.  Signed: Toribio Door, MD [facility] 06/15/2024

## 2024-06-15 NOTE — Hospital Course (Addendum)
 58 year old woman presenting with fatigue, intermittent dizziness, syncope within a week or so of admission, hypotension.  Admitted for acute kidney injury.  Condition rapidly improved with IV fluids and temporary withholding of diuretics.  In discussion with patient, she is quite knowledgeable about her health history, this appears to be related to institution of lisinopril  within the last 2 months.  She did not tolerate this medication well many years ago.  She insisted on resuming her diuretics given chronic lower extremity edema from a young age and known family history.  Blood pressure has been high at home but she attributes this to her thyroid  which has been difficult to control as of late.  We agreed to plan of continuing diuretics and carvedilol  and discontinuing lisinopril .  She will discuss with her PCP on Monday for further recommendations.  Consultants None   Procedures/Events None

## 2024-06-15 NOTE — Progress Notes (Signed)
   06/15/24 0953  TOC Brief Assessment  Insurance and Status Reviewed  Patient has primary care physician Yes  Home environment has been reviewed Resides in private residence  Prior level of function: Independent with ADLs at baseline  Prior/Current Home Services No current home services  Social Drivers of Health Review SDOH reviewed no interventions necessary  Readmission risk has been reviewed Yes  Transition of care needs no transition of care needs at this time

## 2024-06-16 ENCOUNTER — Other Ambulatory Visit (HOSPITAL_COMMUNITY): Payer: Self-pay

## 2024-06-16 ENCOUNTER — Encounter: Payer: Self-pay | Admitting: Physician Assistant

## 2024-06-18 ENCOUNTER — Telehealth: Payer: Self-pay

## 2024-06-18 NOTE — Progress Notes (Signed)
   06/18/2024 Name: Laura [name] MRN: [id number] DOB: [birth year]  Chief Complaint  Patient presents with   Medication Management    Reviewed patient questions on opzelura , gave her update on processing and contract information for [facility] pharmacy.   Lang Sieve, PharmD, BCGP Clinical Pharmacist  [phone]

## 2024-06-18 NOTE — Transitions of Care (Post Inpatient/ED Visit) (Signed)
 06/18/2024  Name: Laura [name] MRN: [id number] DOB: 1966-05-01  Today's TOC FU Call Status: Today's TOC FU Call Status:: Successful TOC FU Call Completed TOC FU Call Complete Date: 06/18/24 Patient's Name and Date of Birth confirmed.  Transition Care Management Follow-up Telephone Call Date of Discharge: 06/15/24 Discharge Facility: [facility]) Type of Discharge: Inpatient Admission Primary Inpatient Discharge Diagnosis:: Acute Kidney Injury How have you been since you were released from the hospital?: Better Any questions or concerns?: Yes Patient Questions/Concerns:: (S) Patient would like to know if labs are needed before visit on 8/21 Patient Questions/Concerns Addressed: Notified Provider of Patient Questions/Concerns  Items Reviewed: Did you receive and understand the discharge instructions provided?: Yes Medications obtained,verified, and reconciled?: Yes (Medications Reviewed) Any new allergies since your discharge?: No Dietary orders reviewed?: NA Do you have support at home?: Yes  Medications Reviewed Today: Medications Reviewed Today     Reviewed by Lavelle Charmaine NOVAK, LPN (Licensed Practical Nurse) on 06/18/24 at 1523  Med List Status: <None>   Medication Order Taking? Sig Documenting Provider Last Dose Status Informant  albuterol  (VENTOLIN  HFA) 108 (90 Base) MCG/ACT inhaler [id number] Yes Inhale 2 puffs into the lungs every 4 (four) hours as needed for wheezing or shortness of breath. Tapia, Leisa, PA-C  Active Self  ALPRAZolam  (XANAX ) 1 MG tablet [id number] Yes Take 1 tablet (1 mg total) by mouth 3 (three) times daily.  Patient taking differently: Take 1 mg by mouth 3 (three) times daily as needed for anxiety.   Milon Cleaves, PA  Active Self  budeson-glycopyrrolate -formoterol  (BREZTRI  AEROSPHERE) 160-9-4.8 MCG/ACT AERO inhaler [id number] Yes Inhale 2 puffs into the lungs 2 (two) times daily.  Patient taking differently: Inhale 2 puffs into the lungs 2 (two)  times daily as needed (for seasonal flares).   Milon Cleaves, PA  Active Self  carvedilol  (COREG ) 6.25 MG tablet [id number] Yes Take 1 tablet (6.25 mg total) by mouth 2 (two) times daily with a meal. Milon Cleaves, [name]  Active Self  chlorthalidone  (HYGROTON ) 25 MG tablet [id number] Yes TAKE ONE TABLET BY MOUTH EVERY DAY Milon Cleaves, [name]  Active Self  EPINEPHrine  0.3 mg/0.3 mL IJ SOAJ injection [id number] Yes Inject 0.3 mLs (0.3 mg total) into the muscle as needed for anaphylaxis. Abran Jerilynn Loving, MD  Active Self  Evolocumab  (REPATHA  SURECLICK) 140 MG/ML [name] [id number] Yes Inject 140 mg into the skin every 14 (fourteen) days. [provider]  Active Self  furosemide  (LASIX ) 20 MG tablet [id number] Yes Take 1 tablet (20 mg total) by mouth 2 (two) times daily.  Patient taking differently: Take 20 mg by mouth in the morning and at bedtime.   Milon Cleaves, [name]  Active Self  glucose blood (ACCU-CHEK GUIDE) test strip [id number] Yes 1 each by Other route daily in the afternoon. Use as instructed Abran Jerilynn Loving, MD  Active   HYDROmorphone  (DILAUDID ) 2 MG tablet [id number] Yes Take 1 tablet (2 mg total) by mouth 3 (three) times daily as needed for moderate pain (pain score 4-6) (pains score 4-6). Lovorn, Megan, MD  Active Self  ipratropium-albuterol  (DUONEB) 0.5-2.5 (3) MG/3ML SOLN [id number] Yes Take 3 mLs by nebulization every 4 (four) hours as needed.  Patient taking differently: Take 3 mLs by nebulization every 4 (four) hours as needed (for asthma symptoms).   Charlene Clotilda PARAS, NP  Active Self  levothyroxine  (SYNTHROID ) 112 MCG tablet [id number] Yes TAKE ONE TABLET BY MOUTH EVERY DAY  Patient taking differently: Take 112 mcg by mouth  daily before breakfast.   Milon Cleaves, PA  Active Self  methylPREDNISolone  (MEDROL ) 4 MG tablet [id number] Yes Take 1 tablet (4 mg total) by mouth daily. Milon Cleaves, [name]  Active Self  nystatin  powder [id number] Yes Apply 1 Application topically 2 (two) times  daily as needed (for skin irritation- affected areas). [provider]  Active Self  ondansetron  (ZOFRAN -ODT) 4 MG disintegrating tablet [id number] Yes Take 1 tablet (4 mg total) by mouth every 8 (eight) hours as needed for nausea or vomiting.  Patient taking differently: Take 4 mg by mouth every 8 (eight) hours as needed for nausea or vomiting (dissolve orally).   Milon Cleaves, PA  Active Self  potassium chloride  SA (KLOR-CON  M) 20 MEQ tablet [id number] Yes Take 1 tablet (20 mEq total) by mouth 2 (two) times daily.  Patient taking differently: Take 40 mEq by mouth 3 (three) times daily.   Milon Cleaves, PA  Active Self  promethazine  (PHENERGAN ) 25 MG tablet [id number] Yes Take 1 tablet (25 mg total) by mouth every 6 (six) hours as needed for nausea or vomiting. Mannie Fairy DASEN, DO  Active Self  Ruxolitinib  Phosphate (OPZELURA ) 1.5 % CREA [id number] Yes Apply sparingly twice daily if needed  Patient taking differently: Apply 1 application  topically See admin instructions. Apply sparingly twice a day as needed/as directed for irritation   Milon Cleaves, [name]  Active Self  Semaglutide ,0.25 or 0.5MG /DOS, (OZEMPIC , 0.25 OR 0.5 MG/DOSE,) 2 MG/3ML SOPN [id number] Yes Inject 0.25 mg into the skin once a week  Patient taking differently: Inject 0.25 mg into the skin every Monday.   Milon Cleaves, PA  Active Self  tiZANidine  (ZANAFLEX ) 2 MG tablet [id number] Yes TAKE FOUR TABLETS BY MOUTH TWICE DAILY FOR MUSCLE SPASMS  Patient taking differently: Take 4 mg by mouth See admin instructions. Take 4 mg by mouth at bedtime and an additional 4 mg up to three times a day as needed for muscle spasms   Lovorn, Duwaine, MD  Active Self  Ubrogepant  (UBRELVY ) 100 MG TABS [id number] Yes Take 1 tablet (100 mg total) by mouth 2 (two) times daily as needed (Take one and if symptoms persist after 2 hours take a second one. Do not take more than 2 in 24 hours). Milon Cleaves, PA  Active   Vitamin D , Ergocalciferol , (DRISDOL )  1.25 MG (50000 UNIT) CAPS capsule [id number] Yes Take 1 capsule (50,000 Units total) by mouth every 7 (seven) days.  Patient taking differently: Take 50,000 Units by mouth every Monday.   Milon Cleaves, PA  Active Self  Vonoprazan Fumarate  (VOQUEZNA ) 10 MG TABS [id number] Yes Take 10 mg by mouth daily. Milon Cleaves, PA  Active Self  zolpidem  (AMBIEN ) 10 MG tablet [id number] Yes Take 1 tablet (10 mg total) by mouth at bedtime. Milon Cleaves, PA  Active Self            Home Care and Equipment/Supplies: Were Home Health Services Ordered?: NA Any new equipment or medical supplies ordered?: NA  Functional Questionnaire: Do you need assistance with bathing/showering or dressing?: No Do you need assistance with meal preparation?: No Do you need assistance with eating?: No Do you have difficulty maintaining continence: No Do you need assistance with getting out of bed/getting out of a chair/moving?: No Do you have difficulty managing or taking your medications?: No  Follow up appointments reviewed: PCP Follow-up appointment confirmed?: Yes Date of PCP follow-up appointment?: 06/21/24 Follow-up Provider: [facility] Follow-up appointment confirmed?: NA Do you need  transportation to your follow-up appointment?: No Do you understand care options if your condition(s) worsen?: Yes-patient verbalized understanding    SIGNATURE Charmaine Bloodgood, LPN [facility] Advisor [employer] [facility] You Are. We Are. [facility] Direct Dial [phone]

## 2024-06-19 ENCOUNTER — Encounter: Payer: Self-pay | Admitting: Endocrinology

## 2024-06-19 ENCOUNTER — Ambulatory Visit (INDEPENDENT_AMBULATORY_CARE_PROVIDER_SITE_OTHER): Admitting: Endocrinology

## 2024-06-19 VITALS — BP 152/98 | HR 78 | Resp 20 | Ht 63.0 in | Wt 186.6 lb

## 2024-06-19 DIAGNOSIS — E063 Autoimmune thyroiditis: Secondary | ICD-10-CM
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
## 2024-06-21 ENCOUNTER — Other Ambulatory Visit: Payer: Self-pay

## 2024-06-21 ENCOUNTER — Encounter: Payer: Self-pay | Admitting: Physician Assistant

## 2024-06-21 ENCOUNTER — Ambulatory Visit (INDEPENDENT_AMBULATORY_CARE_PROVIDER_SITE_OTHER): Admitting: Physician Assistant

## 2024-06-21 VITALS — BP 142/102 | HR 90 | Temp 97.3°F | Ht 63.0 in | Wt 186.0 lb

## 2024-06-21 DIAGNOSIS — E876 Hypokalemia: Secondary | ICD-10-CM | POA: Diagnosis not present

## 2024-06-21 DIAGNOSIS — R600 Localized edema: Secondary | ICD-10-CM | POA: Diagnosis not present

## 2024-06-21 DIAGNOSIS — E063 Autoimmune thyroiditis: Secondary | ICD-10-CM

## 2024-06-21 DIAGNOSIS — G43011 Migraine without aura, intractable, with status migrainosus: Secondary | ICD-10-CM

## 2024-06-21 DIAGNOSIS — I10 Essential (primary) hypertension: Secondary | ICD-10-CM

## 2024-06-21 DIAGNOSIS — N179 Acute kidney failure, unspecified: Secondary | ICD-10-CM | POA: Diagnosis not present
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
## 2024-06-21 NOTE — Patient Instructions (Signed)
  VISIT SUMMARY: During your visit, we discussed your recent hospital stay due to low blood pressure and a fainting episode. We reviewed your history of hypertension, kidney issues, and medication intolerances. We also addressed your concerns about your current medication regimen, blood pressure management, and potential return to work.  YOUR PLAN: -HYPERTENSION WITH LABILE BLOOD PRESSURE AND MEDICATION INTOLERANCE: You have fluctuating high blood pressure and difficulty tolerating many blood pressure medications. We will continue your current medication, carvedilol , at 6.25 mg twice daily. Monitor your blood pressure and heart rate regularly. If your blood pressure and heart rate remain high, we may consider switching to propranolol . Avoid taking carvedilol  if your blood pressure is below 100/60 mmHg, and take it twice daily if your blood pressure is above 150/100 mmHg. We will avoid ACE inhibitors and ARBs due to your adverse reactions to them.  -CHRONIC KIDNEY DISEASE WITH RECENT ACUTE KIDNEY INJURY: You have chronic kidney disease, which recently worsened but has since improved. We will order labs to check your kidney function and recommend you limit your fluid intake to 32-48 ounces per day. Avoid ACE inhibitors and ARBs as they can harm your kidneys.  -EDEMA DUE TO FLUID OVERLOAD AND DIURETIC TITRATION: You have swelling due to fluid retention, which we are managing with Lasix . Continue taking Lasix  20 mg once daily and monitor for signs of swelling. Adjust your diuretic use if you notice increased swelling or other symptoms of fluid overload.  -HYPOKALEMIA, RESOLVED BUT AT RISK FOR RECURRENCE: You had low potassium levels, which have improved but could drop again due to your diuretic use. We will order labs to check your potassium levels and adjust your potassium supplements based on the results.  -HASHIMOTO'S THYROIDITIS: You have an underactive thyroid , which you are managing well with your  current thyroid  medication. Continue taking your thyroid  supplements as prescribed.  -MIGRAINE: You experience migraines, and the cost of your previous medication, Ubrelvy , was a barrier. We will provide you with samples of Ubrelvy  to help manage your migraines.  INSTRUCTIONS: Please follow up with the lab tests we have ordered to check your kidney function and potassium levels. Continue monitoring your blood pressure and heart rate at home, and adjust your carvedilol  dosage as instructed. If you experience any new or worsening symptoms, contact our office immediately. Consider discussing short-term disability with your employer if your condition does not stabilize.

## 2024-06-22 ENCOUNTER — Encounter: Payer: Self-pay | Admitting: Physician Assistant

## 2024-06-22 LAB — CBC WITH DIFFERENTIAL/PLATELET
Basophils Absolute: 0.1 x10E3/uL (ref 0.0–0.2)
Basos: 1 %
EOS (ABSOLUTE): 0.1 x10E3/uL (ref 0.0–0.4)
Eos: 2 %
Hematocrit: 43.4 % (ref 34.0–46.6)
Hemoglobin: 13.9 g/dL (ref 11.1–15.9)
Immature Grans (Abs): 0 x10E3/uL (ref 0.0–0.1)
Immature Granulocytes: 0 %
Lymphocytes Absolute: 2.1 x10E3/uL (ref 0.7–3.1)
Lymphs: 28 %
MCH: 28.7 pg (ref 26.6–33.0)
MCHC: 32 g/dL (ref 31.5–35.7)
MCV: 90 fL (ref 79–97)
Monocytes Absolute: 0.7 x10E3/uL (ref 0.1–0.9)
Monocytes: 9 %
Neutrophils Absolute: 4.3 x10E3/uL (ref 1.4–7.0)
Neutrophils: 59 %
Platelets: 421 x10E3/uL (ref 150–450)
RBC: 4.85 x10E6/uL (ref 3.77–5.28)
RDW: 13.3 % (ref 11.7–15.4)
WBC: 7.3 x10E3/uL (ref 3.4–10.8)

## 2024-06-22 LAB — COMPREHENSIVE METABOLIC PANEL WITH GFR
ALT: 25 IU/L (ref 0–32)
AST: 22 IU/L (ref 0–40)
Albumin: 4.5 g/dL (ref 3.8–4.9)
Alkaline Phosphatase: 66 IU/L (ref 44–121)
BUN/Creatinine Ratio: 21 (ref 9–23)
BUN: 18 mg/dL (ref 6–24)
Bilirubin Total: 0.2 mg/dL (ref 0.0–1.2)
CO2: 27 mmol/L (ref 20–29)
Calcium: 10.5 mg/dL — ABNORMAL HIGH (ref 8.7–10.2)
Chloride: 95 mmol/L — ABNORMAL LOW (ref 96–106)
Creatinine, Ser: 0.85 mg/dL (ref 0.57–1.00)
Globulin, Total: 2.5 g/dL (ref 1.5–4.5)
Glucose: 126 mg/dL — ABNORMAL HIGH (ref 70–99)
Potassium: 3.3 mmol/L — ABNORMAL LOW (ref 3.5–5.2)
Sodium: 139 mmol/L (ref 134–144)
Total Protein: 7 g/dL (ref 6.0–8.5)
eGFR: 79 mL/min/1.73 (ref >59)

## 2024-06-22 LAB — MICROALBUMIN / CREATININE URINE RATIO
Creatinine, Urine: 61.9 mg/dL
Microalb/Creat Ratio: <5 mg/g{creat} (ref 0–29)
Microalbumin, Urine: <3 ug/mL

## 2024-06-23 ENCOUNTER — Other Ambulatory Visit: Payer: Self-pay | Admitting: Physician Assistant

## 2024-06-23 DIAGNOSIS — E039 Hypothyroidism, unspecified: Secondary | ICD-10-CM

## 2024-06-24 DIAGNOSIS — R600 Localized edema: Secondary | ICD-10-CM | POA: Insufficient documentation

## 2024-06-24 DIAGNOSIS — E063 Autoimmune thyroiditis: Secondary | ICD-10-CM | POA: Insufficient documentation

## 2024-06-24 NOTE — Assessment & Plan Note (Signed)
 Chronic kidney disease with recent acute kidney injury likely exacerbated by lisinopril . Improved kidney function post-hospitalization. Concerns about fluid management. - Order labs to assess kidney function. - Monitor fluid intake, limit to 32-48 ounces per day. - Avoid ACE inhibitors and ARBs.

## 2024-06-24 NOTE — Assessment & Plan Note (Signed)
 Hashimoto's thyroiditis with ongoing thyroid  supplementation and good adherence. - Continue current thyroid  supplementation.

## 2024-06-24 NOTE — Assessment & Plan Note (Signed)
 Resolved hypokalemia with risk of recurrence due to diuretic use. - Order labs to assess potassium levels. - Adjust potassium supplementation based on lab results and diuretic use.

## 2024-06-24 NOTE — Assessment & Plan Note (Signed)
 Edema managed with Lasix . Previous diuretic use led to headaches, indicating possible dehydration. - Continue Lasix  20 mg once daily. - Monitor for pitting edema and adjust diuretic use accordingly. - Educate on signs of fluid overload and when to adjust diuretic use.

## 2024-06-24 NOTE — Assessment & Plan Note (Signed)
 Migraines. Previous management with Ubrelvy  hindered by cost. - Provide samples of Ubrelvy  for migraine management.

## 2024-06-24 NOTE — Assessment & Plan Note (Signed)
 Labile hypertension with intolerance to multiple antihypertensives. Current regimen includes carvedilol  with potential dose adjustment. Avoidance of ACE inhibitors and ARBs due to adverse effects. - Continue carvedilol  6.25 mg twice daily. - Monitor blood pressure and heart rate. - Consider propranolol  if blood pressure and heart rate remain elevated despite carvedilol . - Avoid ACE inhibitors and ARBs. - Educated on blood pressure parameters: avoid carvedilol  if BP < 100/60 mmHg, take twice daily if BP > 150/100 mmHg.

## 2024-06-25 ENCOUNTER — Encounter: Payer: Self-pay | Admitting: Physician Assistant

## 2024-06-26 ENCOUNTER — Ambulatory Visit: Payer: Self-pay | Admitting: Physician Assistant

## 2024-06-26 ENCOUNTER — Other Ambulatory Visit: Payer: Self-pay | Admitting: Physician Assistant

## 2024-06-26 MED ORDER — CARVEDILOL 12.5 MG PO TABS
12.5000 mg | ORAL_TABLET | Freq: Two times a day (BID) | ORAL | 3 refills | Status: DC
Start: 2024-06-26 — End: 2024-08-23

## 2024-06-26 NOTE — Progress Notes (Signed)
 Checked back in on patient's opzelura  order and noted that it had been delivered 06/22/2024.

## 2024-06-27 ENCOUNTER — Telehealth: Payer: Self-pay

## 2024-06-27 ENCOUNTER — Encounter: Payer: Self-pay | Admitting: Physician Assistant

## 2024-06-27 ENCOUNTER — Ambulatory Visit (INDEPENDENT_AMBULATORY_CARE_PROVIDER_SITE_OTHER): Admitting: Physician Assistant

## 2024-06-27 VITALS — BP 146/90 | HR 96 | Temp 97.8°F | Ht 63.0 in | Wt 185.0 lb

## 2024-06-27 DIAGNOSIS — I10 Essential (primary) hypertension: Secondary | ICD-10-CM | POA: Diagnosis not present

## 2024-06-27 DIAGNOSIS — R600 Localized edema: Secondary | ICD-10-CM | POA: Diagnosis not present

## 2024-06-27 DIAGNOSIS — E1165 Type 2 diabetes mellitus with hyperglycemia: Secondary | ICD-10-CM | POA: Diagnosis not present

## 2024-06-27 MED ORDER — MINOXIDIL 10 MG PO TABS: 5.0000 mg | ORAL_TABLET | Freq: Every day | ORAL | 3 refills | Status: DC
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
## 2024-06-27 NOTE — Assessment & Plan Note (Signed)
 Persistent lower extremity edema despite diuretics. Hypokalemia likely from diuretics and recent hospitalization without potassium. Allergic reactions to ACE inhibitors and calcium  channel blockers. Carvedilol  insufficient for blood pressure control. Minoxidil  considered. Swelling with spironolactone  and metoprolol . Recent hospitalization and fluid overload may contribute to symptoms. Communication delays noted. - Start minoxidil  5 mg, titrate as needed, starting with evening dose. - Adjust carvedilol  dosing to dinnertime. - Continue diuretics and potassium supplementation. - Monitor blood pressure and record readings.

## 2024-06-27 NOTE — Assessment & Plan Note (Signed)
 Type 2 diabetes managed with semaglutide . No nausea with current dosing. Semaglutide  may aid in blood pressure and edema management. Recent hospitalization may have impacted diabetes management. - Continue semaglutide  (Ozempic ) at 0.25 mg dose. - Monitor for side effects, particularly nausea. - Evaluate kidney function if nausea occurs.

## 2024-06-27 NOTE — Progress Notes (Signed)
   06/27/2024 Name: Laura [name] MRN: [id number] DOB: [birth year]  Chief Complaint  Patient presents with   Medication Assistance    Patient called in to let me know that she did in fact receive opzelura  shipment at $0 copay. Expressed her appreciation for helping to get this covered.   Lang Sieve, PharmD, BCGP Clinical Pharmacist  [phone]

## 2024-06-27 NOTE — Assessment & Plan Note (Signed)
 Refractory hypertension with evening exacerbation, headaches, and palpitations. Persistent lower extremity edema despite diuretics. Hypokalemia likely from diuretics and recent hospitalization without potassium. Allergic reactions to ACE inhibitors and calcium  channel blockers. Carvedilol  insufficient for blood pressure control. Minoxidil  considered. Swelling with spironolactone  and metoprolol . Recent hospitalization and fluid overload may contribute to symptoms. Communication delays noted. - Start minoxidil  5 mg, titrate as needed, starting with evening dose. - Adjust carvedilol  dosing to dinnertime. - Continue diuretics and potassium supplementation. - Monitor blood pressure and record readings. - Schedule blood pressure check with nurse next week. - Consider cardiology referral if blood pressure remains uncontrolled.

## 2024-07-05 ENCOUNTER — Other Ambulatory Visit

## 2024-07-06 ENCOUNTER — Encounter: Payer: Self-pay | Admitting: Physician Assistant

## 2024-07-06 ENCOUNTER — Ambulatory Visit (INDEPENDENT_AMBULATORY_CARE_PROVIDER_SITE_OTHER): Admitting: Physician Assistant

## 2024-07-06 ENCOUNTER — Other Ambulatory Visit

## 2024-07-06 VITALS — BP 146/94 | HR 78 | Temp 97.1°F | Ht 63.0 in | Wt 183.0 lb

## 2024-07-06 DIAGNOSIS — G43011 Migraine without aura, intractable, with status migrainosus: Secondary | ICD-10-CM

## 2024-07-06 DIAGNOSIS — R0683 Snoring: Secondary | ICD-10-CM | POA: Diagnosis not present

## 2024-07-06 DIAGNOSIS — E876 Hypokalemia: Secondary | ICD-10-CM

## 2024-07-06 DIAGNOSIS — J0110 Acute frontal sinusitis, unspecified: Secondary | ICD-10-CM | POA: Diagnosis not present

## 2024-07-06 DIAGNOSIS — I10 Essential (primary) hypertension: Secondary | ICD-10-CM | POA: Diagnosis not present

## 2024-07-06 MED ORDER — CLARITHROMYCIN 250 MG PO TABS
250.0000 mg | ORAL_TABLET | Freq: Two times a day (BID) | ORAL | 0 refills | Status: DC
Start: 2024-07-06 — End: 2024-07-24
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
## 2024-07-07 LAB — COMPREHENSIVE METABOLIC PANEL WITH GFR
ALT: 24 IU/L (ref 0–32)
AST: 27 IU/L (ref 0–40)
Albumin: 4.2 g/dL (ref 3.8–4.9)
Alkaline Phosphatase: 66 IU/L (ref 44–121)
BUN/Creatinine Ratio: 18 (ref 9–23)
BUN: 15 mg/dL (ref 6–24)
Bilirubin Total: 0.3 mg/dL (ref 0.0–1.2)
CO2: 27 mmol/L (ref 20–29)
Calcium: 9.7 mg/dL (ref 8.7–10.2)
Chloride: 96 mmol/L (ref 96–106)
Creatinine, Ser: 0.83 mg/dL (ref 0.57–1.00)
Globulin, Total: 2.2 g/dL (ref 1.5–4.5)
Glucose: 121 mg/dL — ABNORMAL HIGH (ref 70–99)
Potassium: 3.5 mmol/L (ref 3.5–5.2)
Sodium: 140 mmol/L (ref 134–144)
Total Protein: 6.4 g/dL (ref 6.0–8.5)
eGFR: 82 mL/min/1.73 (ref >59)

## 2024-07-09 ENCOUNTER — Other Ambulatory Visit: Payer: Self-pay | Admitting: Physician Assistant

## 2024-07-09 DIAGNOSIS — I10 Essential (primary) hypertension: Secondary | ICD-10-CM

## 2024-07-09 MED ORDER — PRAZOSIN HCL 1 MG PO CAPS: 1.0000 mg | ORAL_CAPSULE | Freq: Two times a day (BID) | ORAL | 2 refills | Status: DC

## 2024-07-10 ENCOUNTER — Ambulatory Visit: Payer: Self-pay | Admitting: Physician Assistant

## 2024-07-13 ENCOUNTER — Encounter: Payer: Self-pay | Admitting: Physician Assistant

## 2024-07-13 ENCOUNTER — Other Ambulatory Visit: Payer: Self-pay | Admitting: Physician Assistant

## 2024-07-13 MED ORDER — VALACYCLOVIR HCL 1 G PO TABS: ORAL_TABLET | ORAL | 2 refills | Status: DC

## 2024-07-17 ENCOUNTER — Other Ambulatory Visit: Payer: Self-pay | Admitting: Family Medicine

## 2024-07-17 ENCOUNTER — Other Ambulatory Visit (HOSPITAL_BASED_OUTPATIENT_CLINIC_OR_DEPARTMENT_OTHER): Payer: Self-pay

## 2024-07-17 DIAGNOSIS — E876 Hypokalemia: Secondary | ICD-10-CM

## 2024-07-18 ENCOUNTER — Other Ambulatory Visit

## 2024-07-18 DIAGNOSIS — E876 Hypokalemia: Secondary | ICD-10-CM

## 2024-07-19 ENCOUNTER — Other Ambulatory Visit

## 2024-07-19 LAB — COMPREHENSIVE METABOLIC PANEL WITH GFR
ALT: 16 IU/L (ref 0–32)
AST: 20 IU/L (ref 0–40)
Albumin: 4.3 g/dL (ref 3.8–4.9)
Alkaline Phosphatase: 68 IU/L (ref 49–135)
BUN/Creatinine Ratio: 23 (ref 9–23)
BUN: 19 mg/dL (ref 6–24)
Bilirubin Total: 0.4 mg/dL (ref 0.0–1.2)
CO2: 29 mmol/L (ref 20–29)
Calcium: 9.9 mg/dL (ref 8.7–10.2)
Chloride: 92 mmol/L — ABNORMAL LOW (ref 96–106)
Creatinine, Ser: 0.84 mg/dL (ref 0.57–1.00)
Globulin, Total: 2.6 g/dL (ref 1.5–4.5)
Glucose: 101 mg/dL — ABNORMAL HIGH (ref 70–99)
Potassium: 3.7 mmol/L (ref 3.5–5.2)
Sodium: 136 mmol/L (ref 134–144)
Total Protein: 6.9 g/dL (ref 6.0–8.5)
eGFR: 80 mL/min/1.73 (ref >59)

## 2024-07-23 ENCOUNTER — Ambulatory Visit: Payer: Self-pay | Admitting: Physician Assistant

## 2024-07-24 ENCOUNTER — Encounter: Payer: Self-pay | Admitting: Physician Assistant

## 2024-07-24 ENCOUNTER — Encounter: Payer: Self-pay | Admitting: Orthopedic Surgery

## 2024-07-24 ENCOUNTER — Ambulatory Visit: Admitting: Physician Assistant

## 2024-07-24 VITALS — BP 142/80 | HR 76 | Temp 98.7°F | Resp 18 | Ht 63.0 in | Wt 177.4 lb

## 2024-07-24 DIAGNOSIS — E538 Deficiency of other specified B group vitamins: Secondary | ICD-10-CM | POA: Diagnosis not present

## 2024-07-24 DIAGNOSIS — M797 Fibromyalgia: Secondary | ICD-10-CM

## 2024-07-24 DIAGNOSIS — R110 Nausea: Secondary | ICD-10-CM | POA: Diagnosis not present

## 2024-07-24 DIAGNOSIS — M25542 Pain in joints of left hand: Secondary | ICD-10-CM

## 2024-07-24 DIAGNOSIS — E039 Hypothyroidism, unspecified: Secondary | ICD-10-CM | POA: Diagnosis not present

## 2024-07-24 DIAGNOSIS — E1165 Type 2 diabetes mellitus with hyperglycemia: Secondary | ICD-10-CM

## 2024-07-24 DIAGNOSIS — J0100 Acute maxillary sinusitis, unspecified: Secondary | ICD-10-CM

## 2024-07-24 DIAGNOSIS — D8940 Mast cell activation, unspecified: Secondary | ICD-10-CM

## 2024-07-24 DIAGNOSIS — E782 Mixed hyperlipidemia: Secondary | ICD-10-CM | POA: Diagnosis not present

## 2024-07-24 DIAGNOSIS — G894 Chronic pain syndrome: Secondary | ICD-10-CM

## 2024-07-24 DIAGNOSIS — I10 Essential (primary) hypertension: Secondary | ICD-10-CM | POA: Diagnosis not present

## 2024-07-24 MED ORDER — CEFTRIAXONE SODIUM 1 G IJ SOLR
1.0000 g | Freq: Once | INTRAMUSCULAR | Status: AC
  Administered 2024-07-24: 1 g via INTRAMUSCULAR

## 2024-07-24 MED ORDER — FLUCONAZOLE 150 MG PO TABS: 150.0000 mg | ORAL_TABLET | Freq: Once | ORAL | 1 refills | Status: AC

## 2024-07-24 NOTE — Assessment & Plan Note (Signed)
 Symptoms include sore throat, ear popping, and fluid in ears. Clarithromycin  previously effective. - Prescribe clarithromycin  250 mg for 7 days. - Monitor symptoms and seek follow-up if symptoms persist or worsen.

## 2024-07-24 NOTE — Assessment & Plan Note (Signed)
 Intermittent headaches distinct from sinus-related headaches. Ubrelvy  used cautiously due to availability and insurance issues. - Monitor headache frequency and severity. - Consider alternative migraine medications if headaches worsen.
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
## 2024-07-24 NOTE — Assessment & Plan Note (Signed)
 Continues to have issues with sleep Referral sent for sleep study

## 2024-07-24 NOTE — Assessment & Plan Note (Signed)
 Hypertension remains uncontrolled with elevated blood pressure despite carvedilol  and minoxidil . Concerns about hypotension with combined use. Chronic edema managed with diuretics. Limited options due to ACE inhibitor reactions. - Continue carvedilol  12.5 mg twice daily. - Continue minoxidil  5 mg at bedtime. - Monitor blood pressure regularly, especially before bedtime. - Consider increasing minoxidil  to 10 mg if hypertension persists. - Refer to cardiologist for further management.

## 2024-07-24 NOTE — Telephone Encounter (Signed)
 Yes ok for oow - right tka not before 08/09/2024 thx

## 2024-07-24 NOTE — Assessment & Plan Note (Addendum)
 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 Minoxidil  and prazosin  caused significant side effects. - Refer to Dr. Darron for renal artery procedure consultation. - Complete leave of absence paperwork due to blood pressure management issues. BP Readings from Last 3 Encounters:  07/26/24 (!) 142/94  07/24/24 (!) 142/80  07/06/24 (!) 146/94     Orders:   CBC with Differential/Platelet   Hemoglobin A1c

## 2024-07-24 NOTE — Assessment & Plan Note (Signed)
 Type 2 diabetes mellitus Diabetes management ongoing. Recent weight loss noted. No current diabetic foot issues. - Perform A1c test today. - Check feet for wounds regularly.

## 2024-07-25 ENCOUNTER — Encounter: Payer: Self-pay | Admitting: Physician Assistant

## 2024-07-25 LAB — LIPID PANEL
Chol/HDL Ratio: 3.7 ratio (ref 0.0–4.4)
Cholesterol, Total: 228 mg/dL — ABNORMAL HIGH (ref 100–199)
HDL: 62 mg/dL (ref >39)
LDL Chol Calc (NIH): 127 mg/dL — ABNORMAL HIGH (ref 0–99)
Triglycerides: 225 mg/dL — ABNORMAL HIGH (ref 0–149)
VLDL Cholesterol Cal: 39 mg/dL (ref 5–40)

## 2024-07-25 LAB — CBC WITH DIFFERENTIAL/PLATELET
Basophils Absolute: 0.1 x10E3/uL (ref 0.0–0.2)
Basos: 1 %
EOS (ABSOLUTE): 0.1 x10E3/uL (ref 0.0–0.4)
Eos: 1 %
Hematocrit: 46.6 % (ref 34.0–46.6)
Hemoglobin: 14.8 g/dL (ref 11.1–15.9)
Immature Grans (Abs): 0 x10E3/uL (ref 0.0–0.1)
Immature Granulocytes: 0 %
Lymphocytes Absolute: 1.8 x10E3/uL (ref 0.7–3.1)
Lymphs: 24 %
MCH: 28.6 pg (ref 26.6–33.0)
MCHC: 31.8 g/dL (ref 31.5–35.7)
MCV: 90 fL (ref 79–97)
Monocytes Absolute: 0.7 x10E3/uL (ref 0.1–0.9)
Monocytes: 9 %
Neutrophils Absolute: 4.7 x10E3/uL (ref 1.4–7.0)
Neutrophils: 65 %
Platelets: 424 x10E3/uL (ref 150–450)
RBC: 5.17 x10E6/uL (ref 3.77–5.28)
RDW: 13.3 % (ref 11.7–15.4)
WBC: 7.4 x10E3/uL (ref 3.4–10.8)

## 2024-07-25 LAB — IRON,TIBC AND FERRITIN PANEL
Ferritin: 207 ng/mL — ABNORMAL HIGH (ref 15–150)
Iron Saturation: 16 % (ref 15–55)
Iron: 65 ug/dL (ref 27–159)
Total Iron Binding Capacity: 399 ug/dL (ref 250–450)
UIBC: 334 ug/dL (ref 131–425)

## 2024-07-25 LAB — URIC ACID: Uric Acid: 6.5 mg/dL (ref 3.0–7.2)

## 2024-07-25 LAB — B12 AND FOLATE PANEL
Folate: 13.5 ng/mL (ref >3.0)
Vitamin B-12: 1341 pg/mL — ABNORMAL HIGH (ref 232–1245)

## 2024-07-25 LAB — T4, FREE: Free T4: 2.08 ng/dL — ABNORMAL HIGH (ref 0.82–1.77)

## 2024-07-25 LAB — TSH: TSH: 0.388 u[IU]/mL — ABNORMAL LOW (ref 0.450–4.500)

## 2024-07-25 LAB — HEMOGLOBIN A1C
Est. average glucose Bld gHb Est-mCnc: 120 mg/dL
Hgb A1c MFr Bld: 5.8 % — ABNORMAL HIGH (ref 4.8–5.6)

## 2024-07-25 NOTE — Progress Notes (Addendum)
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 falls off after 4 days call [employer] at [phone] for suggestions on securing your monitor.  Your physician has requested that you have an echocardiogram. Echocardiography is a painless test that uses sound waves to create images of your heart. It provides your doctor with information about the size and shape of your heart and how well your heart's chambers and valves are working.   You may receive an ultrasound enhancing agent through  an IV if needed to better visualize your heart during the echo. This procedure takes approximately one hour.  There are no restrictions for this procedure.  This will take place at [address] ([facility], [place] [address]  Your provider has ordered a Lexiscan / Exercise Myoview  Stress test. This will take place at [facility]. Please report to the [facility] medical mall entrance. The volunteers at the first desk will direct you where to go.  [facility] MYOVIEW   Your provider has ordered a Stress Test with nuclear imaging. The purpose of this test is to evaluate the blood supply to your heart muscle. This procedure is referred to as a Non-Invasive Stress Test. This is because other than having an IV started in your  vein, nothing is inserted or invades your body. Cardiac stress tests are done to find areas of poor blood flow to the heart by determining the extent of coronary artery disease (CAD). Some patients exercise on a treadmill, which naturally increases the blood flow to your heart, while others who are unable to walk on a treadmill due to physical limitations will have a pharmacologic/chemical stress agent called Lexiscan  . This medicine will mimic walking on a treadmill by temporarily increasing your coronary blood flow.   Please note: these test may take anywhere between 2-4 hours to complete  How to prepare for your Myoview  test:  Nothing to eat for 6 hours prior to the test No caffeine for 24 hours prior to test No smoking 24 hours prior to test. Your medication may be taken with water.  If your doctor stopped a medication because of this test, do not take that medication. Ladies, please do not wear dresses.  Skirts or pants are appropriate. Please wear a short sleeve shirt. No perfume, cologne or lotion. Wear comfortable walking shoes. No heels!   PLEASE NOTIFY THE OFFICE AT LEAST 24 HOURS IN ADVANCE IF YOU ARE UNABLE TO KEEP YOUR APPOINTMENT.  [phone] AND  PLEASE NOTIFY NUCLEAR MEDICINE AT [facility] AT LEAST 24 HOURS IN ADVANCE IF YOU ARE UNABLE TO KEEP YOUR APPOINTMENT. [phone]   Follow-Up: At [facility], you and your health needs are our priority.  As part of our continuing mission to provide you with exceptional heart care, our providers are all part of one team.  This team includes your primary Cardiologist (physician) and Advanced Practice Providers or APPs (Physician Assistants and Nurse Practitioners) who all work together to provide you with the care you need, when you need it.  Your next appointment:   2 month(s)  Provider:   You may see Deatrice Cage, MD or [name], PA-C   Signed, Shamia Uppal E Katherene Dinino, NP  07/26/2024 1:11 PM    [facility]

## 2024-07-26 ENCOUNTER — Ambulatory Visit: Attending: Physician Assistant | Admitting: Emergency Medicine

## 2024-07-26 ENCOUNTER — Ambulatory Visit

## 2024-07-26 ENCOUNTER — Other Ambulatory Visit: Payer: Self-pay | Admitting: Physician Assistant

## 2024-07-26 ENCOUNTER — Encounter: Payer: Self-pay | Admitting: Physician Assistant

## 2024-07-26 ENCOUNTER — Other Ambulatory Visit (HOSPITAL_COMMUNITY): Payer: Self-pay

## 2024-07-26 VITALS — BP 142/94 | HR 69 | Ht 65.0 in | Wt 178.0 lb

## 2024-07-26 DIAGNOSIS — R0602 Shortness of breath: Secondary | ICD-10-CM

## 2024-07-26 DIAGNOSIS — R079 Chest pain, unspecified: Secondary | ICD-10-CM

## 2024-07-26 DIAGNOSIS — E785 Hyperlipidemia, unspecified: Secondary | ICD-10-CM | POA: Diagnosis not present

## 2024-07-26 DIAGNOSIS — G4733 Obstructive sleep apnea (adult) (pediatric): Secondary | ICD-10-CM

## 2024-07-26 DIAGNOSIS — K117 Disturbances of salivary secretion: Secondary | ICD-10-CM

## 2024-07-26 DIAGNOSIS — E039 Hypothyroidism, unspecified: Secondary | ICD-10-CM

## 2024-07-26 DIAGNOSIS — E063 Autoimmune thyroiditis: Secondary | ICD-10-CM | POA: Diagnosis not present

## 2024-07-26 DIAGNOSIS — R002 Palpitations: Secondary | ICD-10-CM

## 2024-07-26 DIAGNOSIS — I10 Essential (primary) hypertension: Secondary | ICD-10-CM | POA: Diagnosis not present

## 2024-07-26 NOTE — Patient Instructions (Signed)
 Medication Instructions:  Your physician recommends that you continue on your current medications as directed. Please refer to the Current Medication list given to you today.   *If you need a refill on your cardiac medications before your next appointment, please call your pharmacy*  Lab Work: None ordered at this time   Testing/Procedures: ZIO XT- Long Term Monitor Instructions  Your physician has requested you wear a ZIO patch monitor for 7 days.  This is a single patch monitor. [employer] supplies one patch monitor per enrollment. Additional stickers are not available. Please do not apply patch if you will be having a Nuclear Stress Test, Echocardiogram, Cardiac CT, MRI, or Chest Xray during the period you would be wearing the monitor. The patch cannot be worn during these tests. You cannot remove and re-apply the ZIO XT patch monitor.  Your ZIO patch monitor will be mailed 3 day USPS to your address on file. It may take 3-5 days to receive your monitor after you have been enrolled. Once you have received your monitor, please review the enclosed instructions. Your monitor has already been registered assigning a specific monitor serial number to you.  Billing and Patient Assistance Program Information  We have supplied [employer] with any of your insurance information on file for billing purposes.  [employer] offers a sliding scale Patient Assistance Program for patients that do not have insurance, or whose insurance does not completely cover the cost of the ZIO monitor.  You must apply for the Patient Assistance Program to qualify for this discounted rate.  To apply, please call [employer] at [phone], select option 4, select option 2, ask to apply for Patient Assistance Program. Meredeth will ask your household income, and how many people are in your household. They will quote your out-of-pocket cost based on that information. [employer] will also be able to set up a 24-month, interest-free payment plan if  needed.  Applying the monitor   Shave hair from upper left chest.  Hold abrader disc by orange tab. Rub abrader in 40 strokes over the upper left chest as indicated in your monitor instructions.  Clean area with 4 enclosed alcohol pads. Let dry.  Apply patch as indicated in monitor instructions. Patch will be placed under collarbone on left side of chest with arrow pointing upward.  Rub patch adhesive wings for 2 minutes. Remove white label marked 1. Remove the white label marked 2. Rub patch adhesive wings for 2 additional minutes.  While looking in a mirror, press and release button in center of patch. A small green light will flash 3-4 times. This will be your only indicator that the monitor has been turned on.  Do not shower for the first 24 hours. You may shower after the first 24 hours.  Press the button if you feel a symptom. You will hear a small click. Record Date, Time and Symptom in the Patient Logbook.  When you are ready to remove the patch, follow instructions on the last 2 pages of Patient Logbook.  Stick patch monitor into the tabs at the bottom of the return box.  Place Patient Logbook in the blue and white box. Use locking tab on box and tape box closed securely. The blue and white box has prepaid postage on it. Please place it in the mailbox as soon as possible. Your physician should have your test results approximately 7-14 days after the monitor has been mailed back to [facility].  Call [facility] Customer Care at [phone] if you have  questions regarding your ZIO XT patch monitor.  Call them immediately if you see an orange light blinking on your monitor.  If your monitor falls off in less than 4 days, contact our Monitor department at [phone].  If your monitor becomes loose or falls off after 4 days call [employer] at [phone] for suggestions on securing your monitor.  Your physician has requested that you have an echocardiogram. Echocardiography is  a painless test that uses sound waves to create images of your heart. It provides your doctor with information about the size and shape of your heart and how well your heart's chambers and valves are working.   You may receive an ultrasound enhancing agent through an IV if needed to better visualize your heart during the echo. This procedure takes approximately one hour.  There are no restrictions for this procedure.  This will take place at [address] ([facility]) [address], [place] [address]  Your provider has ordered a Lexiscan / Exercise Myoview  Stress test. This will take place at [facility]. Please report to the [facility] medical mall entrance. The volunteers at the first desk will direct you where to go.  [facility] MYOVIEW   Your provider has ordered a Stress Test with nuclear imaging. The purpose of this test is to evaluate the blood supply to your heart muscle. This procedure is referred to as a Non-Invasive Stress Test. This is because other than having an IV started in your vein, nothing is inserted or invades your body. Cardiac stress tests are done to find areas of poor blood flow to the heart by determining the extent of coronary artery disease (CAD). Some patients exercise on a treadmill, which naturally increases the blood flow to your heart, while others who are unable to walk on a treadmill due to physical limitations will have a pharmacologic/chemical stress agent called Lexiscan  . This medicine will mimic walking on a treadmill by temporarily increasing your coronary blood flow.   Please note: these test may take anywhere between 2-4 hours to complete  How to prepare for your Myoview  test:  Nothing to eat for 6 hours prior to the test No caffeine for 24 hours prior to test No smoking 24 hours prior to test. Your medication may be taken with water.  If your doctor stopped a medication because of this test, do not take that medication. Ladies, please do not wear dresses.   Skirts or pants are appropriate. Please wear a short sleeve shirt. No perfume, cologne or lotion. Wear comfortable walking shoes. No heels!   PLEASE NOTIFY THE OFFICE AT LEAST 24 HOURS IN ADVANCE IF YOU ARE UNABLE TO KEEP YOUR APPOINTMENT.  [phone] AND  PLEASE NOTIFY NUCLEAR MEDICINE AT [facility] AT LEAST 24 HOURS IN ADVANCE IF YOU ARE UNABLE TO KEEP YOUR APPOINTMENT. [phone]   Follow-Up: At [facility], you and your health needs are our priority.  As part of our continuing mission to provide you with exceptional heart care, our providers are all part of one team.  This team includes your primary Cardiologist (physician) and Advanced Practice Providers or APPs (Physician Assistants and Nurse Practitioners) who all work together to provide you with the care you need, when you need it.  Your next appointment:   2 month(s)  Provider:   You may see Deatrice Cage, MD or [name], PA-C

## 2024-07-30 ENCOUNTER — Ambulatory Visit: Payer: Self-pay | Admitting: Physician Assistant

## 2024-07-30 ENCOUNTER — Other Ambulatory Visit: Payer: Self-pay | Admitting: Physician Assistant

## 2024-07-30 ENCOUNTER — Other Ambulatory Visit

## 2024-07-30 ENCOUNTER — Ambulatory Visit
Admission: RE | Admit: 2024-07-30 | Discharge: 2024-07-30 | Disposition: A | Discharge status: Home / Self Care | Source: Ambulatory Visit | Attending: Emergency Medicine | Admitting: Emergency Medicine

## 2024-07-30 DIAGNOSIS — K117 Disturbances of salivary secretion: Secondary | ICD-10-CM

## 2024-07-30 DIAGNOSIS — E039 Hypothyroidism, unspecified: Secondary | ICD-10-CM

## 2024-07-30 DIAGNOSIS — E063 Autoimmune thyroiditis: Secondary | ICD-10-CM

## 2024-07-30 DIAGNOSIS — R079 Chest pain, unspecified: Secondary | ICD-10-CM | POA: Diagnosis not present

## 2024-07-30 DIAGNOSIS — R0602 Shortness of breath: Secondary | ICD-10-CM | POA: Diagnosis not present

## 2024-07-30 MED ORDER — REGADENOSON 0.4 MG/5ML IV SOLN
0.4000 mg | Freq: Once | INTRAVENOUS | Status: AC
  Administered 2024-07-30: 0.4 mg via INTRAVENOUS
  Filled 2024-07-30: qty 5

## 2024-07-30 MED ORDER — TECHNETIUM TC 99M TETROFOSMIN IV KIT
32.4600 | PACK | Freq: Once | INTRAVENOUS | Status: AC | PRN
  Administered 2024-07-30: 32.46 via INTRAVENOUS

## 2024-07-30 MED ORDER — TECHNETIUM TC 99M TETROFOSMIN IV KIT
10.3700 | PACK | Freq: Once | INTRAVENOUS | Status: AC | PRN
  Administered 2024-07-30: 10.37 via INTRAVENOUS

## 2024-07-30 NOTE — Progress Notes (Signed)
     Nahla A Fambrough presented for a  nuclear stress test today.  I [name], PA-C, provided direct supervision and was present during the stress portion of the study today, which was completed without significant symptoms, immediate complications, or acute ST/T changes on ECG.  Stress imaging is pending at this time.  Preliminary ECG findings may be listed in the chart, but the stress test result will not be finalized until perfusion imaging is complete.  [name], PA-C  07/30/2024, 9:55 AM

## 2024-07-31 ENCOUNTER — Telehealth: Payer: Self-pay | Admitting: Orthopedic Surgery

## 2024-07-31 LAB — NM MYOCAR MULTI W/SPECT W/WALL MOTION / EF
LV dias vol: 64 mL (ref 46–106)
LV sys vol: 23 mL (ref 3.8–5.2)
Nuc Stress EF: 64 %
Peak HR: 92 {beats}/min
Percent HR: 56 %
Rest HR: 63 {beats}/min
Rest Nuclear Isotope Dose: 10.4 mCi
SDS: 0
SRS: 1
SSS: 1
ST Depression (mm): 0 mm
Stress Nuclear Isotope Dose: 32.5 mCi
TID: 0.82

## 2024-07-31 NOTE — Telephone Encounter (Signed)
 Pt submitted medical release form, states Juel forms was faxed , and paid $20 payment

## 2024-07-31 NOTE — Telephone Encounter (Signed)
 Form completed and faxed.

## 2024-08-01 ENCOUNTER — Ambulatory Visit: Payer: Self-pay | Admitting: Emergency Medicine

## 2024-08-01 ENCOUNTER — Other Ambulatory Visit: Payer: Self-pay | Admitting: Physician Assistant

## 2024-08-01 DIAGNOSIS — Z789 Other specified health status: Secondary | ICD-10-CM

## 2024-08-01 DIAGNOSIS — I10 Essential (primary) hypertension: Secondary | ICD-10-CM

## 2024-08-01 DIAGNOSIS — E1165 Type 2 diabetes mellitus with hyperglycemia: Secondary | ICD-10-CM

## 2024-08-01 DIAGNOSIS — M25542 Pain in joints of left hand: Secondary | ICD-10-CM | POA: Insufficient documentation

## 2024-08-01 DIAGNOSIS — I1A0 Resistant hypertension: Secondary | ICD-10-CM

## 2024-08-01 NOTE — Assessment & Plan Note (Addendum)
 Hypothyroidism on levothyroxine  Hypothyroidism managed with levothyroxine . Symptoms suggest need for thyroid  function reevaluation. - Order thyroid  function tests. Orders:   T4, free   TSH

## 2024-08-01 NOTE — Assessment & Plan Note (Signed)
 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

## 2024-08-01 NOTE — Assessment & Plan Note (Addendum)
 Well controlled.  Continue to work on eating a healthy diet and exercise.  Labs drawn today.   Continue Repatha  as prescribed  Will adjust medication as needed depending on labs Lab Results  Component Value Date   LDLCALC 127 (H) 07/24/2024    Orders:   Lipid panel

## 2024-08-01 NOTE — Assessment & Plan Note (Addendum)
 Labs drawn today Continue to monitor for new or changing symptoms Will adjust treatment based on labs today Orders:   B12 and Folate Panel

## 2024-08-01 NOTE — Assessment & Plan Note (Signed)
 Chronic nausea and vomiting Nausea potentially exacerbated by Ozempic , impacting daily life. - Discontinue Ozempic  and monitor for improvement. - Restart Ozempic  at lower dose if nausea resolves.

## 2024-08-01 NOTE — Assessment & Plan Note (Addendum)
 Thumb joint swelling under evaluation New thumb joint swelling, possibly related to gout or inflammatory process. - Order uric acid level. Orders:   Uric acid   Iron, TIBC and Ferritin Panel

## 2024-08-01 NOTE — Assessment & Plan Note (Signed)
 Fibromyalgia Fibromyalgia contributing to generalized pain and fatigue. Pain management challenging due to medication side effects. - Schedule knee surgery.

## 2024-08-01 NOTE — Assessment & Plan Note (Addendum)
 Chronic sinusitis with facial pain Chronic sinusitis with significant facial pain, limited treatment options due to side effects. - Avoid prednisone . Orders:   cefTRIAXone  (ROCEPHIN ) injection 1 g   fluconazole  (DIFLUCAN ) 150 MG tablet; Take 1 tablet (150 mg total) by mouth once for 1 dose.

## 2024-08-01 NOTE — Assessment & Plan Note (Signed)
 Chronic headache, likely multifactorial Headaches likely due to sinus issues, blood pressure fluctuations, and fibromyalgia. - Avoid prednisone .

## 2024-08-02 ENCOUNTER — Encounter: Payer: Self-pay | Admitting: Physician Assistant

## 2024-08-02 ENCOUNTER — Other Ambulatory Visit: Payer: Self-pay

## 2024-08-02 ENCOUNTER — Emergency Department (HOSPITAL_BASED_OUTPATIENT_CLINIC_OR_DEPARTMENT_OTHER)
Admission: EM | Admit: 2024-08-02 | Discharge: 2024-08-02 | Disposition: A | Discharge status: Home / Self Care | Attending: Emergency Medicine | Admitting: Emergency Medicine

## 2024-08-02 ENCOUNTER — Emergency Department (HOSPITAL_BASED_OUTPATIENT_CLINIC_OR_DEPARTMENT_OTHER)

## 2024-08-02 ENCOUNTER — Encounter (HOSPITAL_BASED_OUTPATIENT_CLINIC_OR_DEPARTMENT_OTHER): Payer: Self-pay

## 2024-08-02 DIAGNOSIS — R531 Weakness: Secondary | ICD-10-CM | POA: Diagnosis not present

## 2024-08-02 DIAGNOSIS — R072 Precordial pain: Secondary | ICD-10-CM | POA: Diagnosis not present

## 2024-08-02 DIAGNOSIS — I6521 Occlusion and stenosis of right carotid artery: Secondary | ICD-10-CM | POA: Diagnosis not present

## 2024-08-02 DIAGNOSIS — R0789 Other chest pain: Secondary | ICD-10-CM | POA: Diagnosis not present

## 2024-08-02 DIAGNOSIS — E876 Hypokalemia: Secondary | ICD-10-CM | POA: Insufficient documentation

## 2024-08-02 DIAGNOSIS — R6889 Other general symptoms and signs: Secondary | ICD-10-CM

## 2024-08-02 DIAGNOSIS — R519 Headache, unspecified: Secondary | ICD-10-CM | POA: Insufficient documentation

## 2024-08-02 DIAGNOSIS — J45909 Unspecified asthma, uncomplicated: Secondary | ICD-10-CM | POA: Insufficient documentation

## 2024-08-02 DIAGNOSIS — R079 Chest pain, unspecified: Secondary | ICD-10-CM | POA: Diagnosis not present

## 2024-08-02 LAB — CBC
HCT: 39.9 % (ref 36.0–46.0)
Hemoglobin: 13.5 g/dL (ref 12.0–15.0)
MCH: 28.7 pg (ref 26.0–34.0)
MCHC: 33.8 g/dL (ref 30.0–36.0)
MCV: 84.7 fL (ref 80.0–100.0)
Platelets: 362 K/uL (ref 150–400)
RBC: 4.71 MIL/uL (ref 3.87–5.11)
RDW: 13.6 % (ref 11.5–15.5)
WBC: 6.9 K/uL (ref 4.0–10.5)
nRBC: 0 % (ref 0.0–0.2)

## 2024-08-02 LAB — BASIC METABOLIC PANEL WITH GFR
Anion gap: 15 (ref 5–15)
BUN: 22 mg/dL — ABNORMAL HIGH (ref 6–20)
CO2: 28 mmol/L (ref 22–32)
Calcium: 9.4 mg/dL (ref 8.9–10.3)
Chloride: 92 mmol/L — ABNORMAL LOW (ref 98–111)
Creatinine, Ser: 1.01 mg/dL — ABNORMAL HIGH (ref 0.44–1.00)
GFR, Estimated: >60 mL/min (ref >60)
Glucose, Bld: 137 mg/dL — ABNORMAL HIGH (ref 70–99)
Potassium: 3.1 mmol/L — ABNORMAL LOW (ref 3.5–5.1)
Sodium: 134 mmol/L — ABNORMAL LOW (ref 135–145)

## 2024-08-02 LAB — TROPONIN T, HIGH SENSITIVITY
Troponin T High Sensitivity: <15 ng/L (ref 0–19)
Troponin T High Sensitivity: <15 ng/L (ref 0–19)

## 2024-08-02 MED ORDER — POTASSIUM CHLORIDE CRYS ER 20 MEQ PO TBCR
40.0000 meq | EXTENDED_RELEASE_TABLET | Freq: Once | ORAL | Status: AC
  Administered 2024-08-02: 40 meq via ORAL
  Filled 2024-08-02: qty 2

## 2024-08-02 MED ORDER — IOHEXOL 350 MG/ML SOLN
60.0000 mL | Freq: Once | INTRAVENOUS | Status: AC | PRN
  Administered 2024-08-02: 60 mL via INTRAVENOUS

## 2024-08-02 MED ORDER — SODIUM CHLORIDE 0.9 % IV BOLUS
1000.0000 mL | Freq: Once | INTRAVENOUS | Status: AC
  Administered 2024-08-02: 1000 mL via INTRAVENOUS

## 2024-08-02 NOTE — ED Notes (Signed)
Patient transported to CT and back without event.

## 2024-08-02 NOTE — ED Notes (Signed)
 Pt reported that she takes three different meds for HTN at three different times of day. Pt educated to hold home HTN meds for tonight. Instructed to check BP in AM and reach out to PCP for further instructions on home medication management. Pt verbalized understanding.

## 2024-08-02 NOTE — ED Notes (Signed)
 ED Provider at bedside.

## 2024-08-02 NOTE — Discharge Instructions (Addendum)
 Your laboratory results are within normal limits today aside from what we discussed.  The CT angio of your head did not show any acute findings.  Follow-up with your primary care physician at your earliest convenience.

## 2024-08-02 NOTE — ED Notes (Signed)
 Attempted IV access to bil AC, unsuccessful, tol well

## 2024-08-02 NOTE — ED Provider Notes (Signed)
 [facility] Provider Note   CSN: [id number] Arrival date & time: 08/02/24  1347     Patient presents with: Multiple Complaints   Laura [name] is a 58 y.o. female.   58 year old female with a past medical history of bowel obstruction, fibromyalgia, prediabetes, heart murmur, pretension, asthma presents to the ED with multiple complaints.  Patient reports that yesterday she was checking her blood pressure and noticed that this was high had a systolic in the 160s diastolic in the 100s.  Reports around after 9 PM the blood pressure was very low.  She began to have some chest pain yesterday in the substernal area radiating to her back and between her shoulder blades.  In addition, she tells me that she also has a headache which began yesterday, this is constant to her whole head however also to the right side of her temple.  She states that this morning when she was filling up paperwork, she noticed spots missing from the paper in addition, she reports overall weakness, I can stand to do the dishes but I feel like the strength is being flown out of me .  Has seen cardiologist, currently wearing Holter monitor.  Recently had testing done for autoimmune disease. Denies any fever, vomiting, or abdominal pain.   The history is provided by the patient.       Prior to Admission medications   Medication Sig Start Date End Date Taking? Authorizing Provider  albuterol  (VENTOLIN  HFA) 108 (90 Base) MCG/ACT inhaler Inhale 2 puffs into the lungs every 4 (four) hours as needed for wheezing or shortness of breath. 06/22/19   Tapia, Leisa, PA-C  ALPRAZolam  (XANAX ) 1 MG tablet Take 1 tablet (1 mg total) by mouth 3 (three) times daily. 02/24/24   Milon Cleaves, PA  budeson-glycopyrrolate -formoterol  (BREZTRI  AEROSPHERE) 160-9-4.8 MCG/ACT AERO inhaler Inhale 2 puffs into the lungs 2 (two) times daily. 03/01/24   Milon Cleaves, PA  carvedilol  (COREG ) 12.5 MG tablet Take  1 tablet (12.5 mg total) by mouth 2 (two) times daily with a meal. 06/26/24   Milon Cleaves, PA  chlorthalidone  (HYGROTON ) 25 MG tablet TAKE ONE TABLET BY MOUTH EVERY DAY 07/09/24   Milon Cleaves, PA  EPINEPHrine  0.3 mg/0.3 mL IJ SOAJ injection Inject 0.3 mLs (0.3 mg total) into the muscle as needed for anaphylaxis. 05/26/20   Abran Jerilynn Loving, MD  Evolocumab  (REPATHA  SURECLICK) 140 MG/ML SOAJ Inject 140 mg into the skin every 14 (fourteen) days.    [provider]  furosemide  (LASIX ) 20 MG tablet Take 1 tablet (20 mg total) by mouth 2 (two) times daily. 02/10/24   Milon Cleaves, PA  glucose blood (ACCU-CHEK GUIDE) test strip 1 each by Other route daily in the afternoon. Use as instructed 04/30/21   Abran Jerilynn Loving, MD  HYDROmorphone  (DILAUDID ) 2 MG tablet Take 1 tablet (2 mg total) by mouth 3 (three) times daily as needed for moderate pain (pain score 4-6) (pains score 4-6). 05/23/24   Lovorn, Megan, MD  ipratropium-albuterol  (DUONEB) 0.5-2.5 (3) MG/3ML SOLN Take 3 mLs by nebulization every 4 (four) hours as needed. 08/19/21   Charlene Clotilda PARAS, NP  levothyroxine  (SYNTHROID ) 112 MCG tablet TAKE ONE TABLET BY MOUTH EVERY DAY 06/25/24   Milon Cleaves, PA  methylPREDNISolone  (MEDROL ) 4 MG tablet Take 1 tablet (4 mg total) by mouth daily. Patient taking differently: Take 4 mg by mouth as needed. 04/30/24   Milon Cleaves, PA  nystatin  powder Apply 1 Application  topically 2 (two) times daily as needed (for skin irritation- affected areas).    [provider]  ondansetron  (ZOFRAN -ODT) 4 MG disintegrating tablet Take 1 tablet (4 mg total) by mouth every 8 (eight) hours as needed for nausea or vomiting. 04/30/24   Milon Cleaves, PA  potassium chloride  SA (KLOR-CON  M) 20 MEQ tablet TAKE TWO TABLETS BY MOUTH 3 TIMES DAILY 07/17/24   Milon Cleaves, PA  prazosin  (MINIPRESS ) 1 MG capsule Take 1 capsule (1 mg total) by mouth 2 (two) times daily. 07/09/24   Milon Cleaves, PA  promethazine  (PHENERGAN ) 25 MG  tablet Take 1 tablet (25 mg total) by mouth every 6 (six) hours as needed for nausea or vomiting. 05/30/24   Mannie Pac T, DO  Ruxolitinib  Phosphate (OPZELURA ) 1.5 % CREA Apply sparingly twice daily if needed 06/13/24   Milon Cleaves, PA  Semaglutide ,0.25 or 0.5MG /DOS, (OZEMPIC , 0.25 OR 0.5 MG/DOSE,) 2 MG/3ML SOPN Inject 0.25 mg into the skin once a week 05/22/24   Milon Cleaves, PA  tiZANidine  (ZANAFLEX ) 2 MG tablet TAKE FOUR TABLETS BY MOUTH TWICE DAILY FOR MUSCLE SPASMS 03/19/24   Lovorn, Megan, MD  Ubrogepant  (UBRELVY ) 100 MG TABS Take 1 tablet (100 mg total) by mouth 2 (two) times daily as needed (Take one and if symptoms persist after 2 hours take a second one. Do not take more than 2 in 24 hours). 09/13/23   Milon Cleaves, PA  Vitamin D , Ergocalciferol , (DRISDOL ) 1.25 MG (50000 UNIT) CAPS capsule Take 1 capsule (50,000 Units total) by mouth every 7 (seven) days. 05/28/24   Milon Cleaves, PA  Vonoprazan Fumarate  (VOQUEZNA ) 10 MG TABS Take 10 mg by mouth daily. 04/25/24   Milon Cleaves, PA  zolpidem  (AMBIEN ) 10 MG tablet Take 1 tablet (10 mg total) by mouth at bedtime. 05/10/24   Milon Cleaves, PA    Allergies: Meperidine, Minoxidil , Prednisone , Shellfish allergy , Shellfish-derived products, Diltiazem , Singulair  [montelukast ], Zetia  [ezetimibe ], Acebutolol , Amlodipine , Celebrex  [celecoxib ], Dexlansoprazole , Dronedarone , Duloxetine  hcl, Flecainide , Lisinopril , Metoprolol , Mexiletine, Omeprazole , Pregabalin , Savella [milnacipran], Sectral  [acebutolol  hcl], Statins, Tramadol , Valsartan , Codeine, Hydralazine , Hydrocodone , Ketorolac  tromethamine , Losartan , Mirtazapine , and Oxycodone     Review of Systems  Constitutional:  Negative for chills and fever.  Respiratory:  Negative for shortness of breath.   Cardiovascular:  Negative for chest pain.  Gastrointestinal:  Negative for abdominal pain, nausea and vomiting.  Genitourinary:  Negative for flank pain.  Musculoskeletal:  Negative for back pain.   Psychiatric/Behavioral:  Negative for confusion.   All other systems reviewed and are negative.   Updated Vital Signs BP 120/70   Pulse 66   Temp 97.7 F (36.5 C) (Oral)   Resp 12   Ht 5' 5 (1.651 m)   Wt 79.4 kg   SpO2 99%   BMI 29.12 kg/m   Physical Exam  (all labs ordered are listed, but only abnormal results are displayed) Labs Reviewed  BASIC METABOLIC PANEL WITH GFR - Abnormal; Notable for the following components:      Result Value   Sodium 134 (*)    Potassium 3.1 (*)    Chloride 92 (*)    Glucose, Bld 137 (*)    BUN 22 (*)    Creatinine, Ser 1.01 (*)    All other components within normal limits  CBC  TROPONIN T, HIGH SENSITIVITY  TROPONIN T, HIGH SENSITIVITY    EKG: None  Radiology: CT ANGIO HEAD NECK W WO CM Result Date: 08/02/2024 EXAM: CTA HEAD AND NECK WITH AND WITHOUT 08/02/2024 04:09:21 PM TECHNIQUE:  CTA of the head and neck was performed with and without the administration of 60 mL of iohexol  (OMNIPAQUE ) 350 MG/ML injection. Multiplanar 2D and/or 3D reformatted images are provided for review. Automated exposure control, iterative reconstruction, and/or weight based adjustment of the mA/kV was utilized to reduce the radiation dose to as low as reasonably achievable. Stenosis of the internal carotid arteries measured using NASCET criteria. COMPARISON: None available CLINICAL HISTORY: Vision loss, known etiology. Pt reports high blood pressure last night, low blood pressure this morning, mid to left chest pain radiating through to under left shoulder blade, generalized weakness, generalized headache, nausea, and sudden loss ; of vision in left eye at 1100 which resolved after 20 minutes. FINDINGS: CTA NECK: AORTIC ARCH AND ARCH VESSELS: Common origin of the brachiocephalic and left common carotid arteries. No dissection or arterial injury. No significant stenosis of the brachiocephalic or subclavian arteries. CERVICAL CAROTID ARTERIES: Minimal atherosclerosis at  the right carotid bifurcation without stenosis. No dissection or arterial injury. No hemodynamically significant stenosis by NASCET criteria. CERVICAL VERTEBRAL ARTERIES: No dissection, arterial injury, or significant stenosis. LUNGS AND MEDIASTINUM: Unremarkable. SOFT TISSUES: Subtle asymmetric fullness of the right palatine tonsil. Recommend nonemergent correlation with direct visualization. Mildly prominent bilateral level 2a cervical nodes, likely reactive. BONES: Degenerative changes in the visualized spine. Disc space narrowing is greatest at C5-C6. CTA HEAD: ANTERIOR CIRCULATION: The intracranial internal carotid arteries are patent bilaterally. Focal atherosclerosis of the right paraclinoid ICA resulting in mild stenosis. The middle cerebral arteries are patent bilaterally. The anterior cerebral arteries are patent bilaterally. No aneurysm. POSTERIOR CIRCULATION: No significant stenosis of the posterior cerebral arteries. No significant stenosis of the basilar artery. No significant stenosis of the vertebral arteries. No aneurysm. OTHER: No acute intracranial hemorrhage, edema, mass effect, or midline shift. The ventricles are unremarkable. No extra axial fluid collections. Basilar cisterns are patent. No dural venous sinus thrombosis on this non-dedicated study. IMPRESSION: 1. No acute intracranial findings. 2. Focal atherosclerosis of the right paraclinoid ICA resulting in mild stenosis. 3. Minimal atherosclerosis at the right carotid bifurcation without stenosis. 4. Question subtle asymmetric fullness of the right palatine tonsil. Recommend nonemergent correlation with direct visualization. 5. Mildly prominent bilateral level 2a cervical nodes, likely reactive. Electronically signed by: Donnice Mania MD 08/02/2024 05:01 PM EDT RP Workstation: [id number]   DG Chest 2 View Result Date: 08/02/2024 EXAM: 2 VIEW(S) XRAY OF THE CHEST 08/02/2024 02:18:52 PM COMPARISON: 06/14/2024 CLINICAL HISTORY: Chest pain.  FINDINGS: LINES, TUBES AND DEVICES: Cardiac monitoring device over left anterior chest. LUNGS AND PLEURA: 6 mm pulmonary nodule projecting over the right anterior sixth rib, unchanged since prior exam. No pulmonary edema. No pleural effusion. No pneumothorax. HEART AND MEDIASTINUM: No acute abnormality of the cardiac and mediastinal silhouettes. BONES AND SOFT TISSUES: No acute osseous abnormality. IMPRESSION: 1. No acute abnormalities. Electronically signed by: Ozell Daring MD 08/02/2024 02:43 PM EDT RP Workstation: [id number]     Procedures   Medications Ordered in the ED  sodium chloride  0.9 % bolus 1,000 mL (0 mLs Intravenous Stopped 08/02/24 1746)  potassium chloride  SA (KLOR-CON  M) CR tablet 40 mEq (40 mEq Oral Given 08/02/24 1506)  iohexol  (OMNIPAQUE ) 350 MG/ML injection 60 mL (60 mLs Intravenous Contrast Given 08/02/24 1609)                                    Medical Decision Making Amount and/or Complexity of Data Reviewed Labs:  ordered. Radiology: ordered.  Risk Prescription drug management.    This patient presents to the ED for concern of multiple complaints, this involves a number of treatment options, and is a complaint that carries with it a high risk of complications and morbidity.  The differential diagnosis includes ACS, CVA, tension.   Co morbidities: Discussed in HPI   Brief History:  See HPI  EMR reviewed including pt PMHx, past surgical history and past visits to ER.   See HPI for more details   Lab Tests:  I ordered and independently interpreted labs.  The pertinent results include:    CBC with no leukocytosis, hemoglobin within normal limits.  BMP with mild hypokalemia, will replace this orally.  Creatinine level slightly elevated and BUN, therefore consistent with likely dehydration.  Imaging Studies:  Chest x-ray without any acute findings. CT angio head and neck: IMPRESSION:  1. No acute intracranial findings.  2. Focal atherosclerosis of  the right paraclinoid ICA resulting in mild  stenosis.  3. Minimal atherosclerosis at the right carotid bifurcation without stenosis.  4. Question subtle asymmetric fullness of the right palatine tonsil. Recommend  nonemergent correlation with direct visualization.  5. Mildly prominent bilateral level 2a cervical nodes, likely reactive.    Cardiac Monitoring:  The patient was maintained on a cardiac monitor.  I personally viewed and interpreted the cardiac monitored which showed an underlying rhythm of: NSR EKG non-ischemic   Medicines ordered:  I ordered medication including bolus for soft blood pressures Reevaluation of the patient after these medicines showed that the patient improved I have reviewed the patients home medicines and have made adjustments as needed  Reevaluation:  After the interventions noted above I re-evaluated patient and found that they have :improved  Social Determinants of Health:  The patient's social determinants of health were a factor in the care of this patient    Problem List / ED Course:  Patient present to the ED with multiple complaints, told her that her blood pressure was running somewhat low yesterday, today her blood pressure has been low according to patient with systolics in the 100 systolic in the 70s maps above 65 while in the ED.  She tells me that she feels like she is going to pass out .  She is followed closely by PCP.  I did review her records and see a prior talk with her PCP about her blood pressure.  She tells me that they are trying to figure out what autoimmune disorder she does have.  She has not seen endocrinology yet.  She also tells me she is having some substernal chest pain.  Did review her records and see a prior stress test which was normal recently, she has delta troponins that are negative on today's visit.  She is currently wearing a Holter monitor. Labs on today's visit do show some low potassium, she tells me she  takes 40 mg of potassium daily but she also tells me that I have diarrhea all the time.  We discussed that she was given oral potassium while in the ED.  She was also given fluids to help with her blood pressure. Chest x-ray did not show any acute findings.  She does tell me of a headache that she had today, she also had some spots missing on the writing that she was doing, this resolved after 30 minutes.  She does not have any deficits on exam, no visual field cuts at this time.  Obtain a CT angio  head that did not show any acute findings.  Did discuss this case with my attending Dr. Lenor, we did discuss MRI imaging as this is not available at [facility] at this time.  Patient does not have any deficits, she has no visual field cuts, a normal neurological exam.  She had a bolus here with improvement in her blood pressure.  I discussed the findings with patient, she is teary-eyed and frustrated that we were unable to find the root of the cause.  I did discuss with her that unfortunately we are unable to provide further workup at this time as we only focus on the emergent workup.  I do feel that patient needs specialist follow-up.  She is hemodynamically stable for discharge.  Dispostion:  After consideration of the diagnostic results and the patients response to treatment, I feel that the patent would benefit from close follow-up with primary care physician.    Portions of this note were generated with Scientist, clinical (histocompatibility and immunogenetics). Dictation errors may occur despite best attempts at proofreading.   Final diagnoses:  Multiple complaints    ED Discharge Orders     None          Maureen Broad, PA-C 08/02/24 1747    Towana Ozell BROCKS, MD 08/02/24 AMOS

## 2024-08-02 NOTE — ED Triage Notes (Signed)
 Sx x yesterday. Multiple complaints. Pt reports high blood pressure last night, low blood pressure this morning, mid to left chest pain radiating through to under left shoulder blade, generalized weakness, generalized headache, nausea, and sudden loss of vision in left eye at 1100 which resolved after 20 minutes. No neuro deficits at this time. No acute distress noted in triage. Pt took xanax  at aprox 1200 today,

## 2024-08-07 ENCOUNTER — Encounter: Payer: Self-pay | Admitting: Physician Assistant

## 2024-08-07 ENCOUNTER — Ambulatory Visit (INDEPENDENT_AMBULATORY_CARE_PROVIDER_SITE_OTHER): Admitting: Physician Assistant

## 2024-08-07 VITALS — BP 124/78 | HR 68 | Temp 97.8°F | Ht 65.0 in | Wt 182.8 lb

## 2024-08-07 DIAGNOSIS — N261 Atrophy of kidney (terminal): Secondary | ICD-10-CM

## 2024-08-07 DIAGNOSIS — I7300 Raynaud's syndrome without gangrene: Secondary | ICD-10-CM

## 2024-08-07 DIAGNOSIS — E063 Autoimmune thyroiditis: Secondary | ICD-10-CM

## 2024-08-07 DIAGNOSIS — M797 Fibromyalgia: Secondary | ICD-10-CM | POA: Diagnosis not present

## 2024-08-07 DIAGNOSIS — I10 Essential (primary) hypertension: Secondary | ICD-10-CM

## 2024-08-07 DIAGNOSIS — F5101 Primary insomnia: Secondary | ICD-10-CM | POA: Diagnosis not present

## 2024-08-07 DIAGNOSIS — Z23 Encounter for immunization: Secondary | ICD-10-CM | POA: Diagnosis not present

## 2024-08-07 DIAGNOSIS — I9589 Other hypotension: Secondary | ICD-10-CM | POA: Diagnosis not present

## 2024-08-07 DIAGNOSIS — E876 Hypokalemia: Secondary | ICD-10-CM

## 2024-08-07 DIAGNOSIS — J0101 Acute recurrent maxillary sinusitis: Secondary | ICD-10-CM | POA: Diagnosis not present

## 2024-08-07 DIAGNOSIS — E1165 Type 2 diabetes mellitus with hyperglycemia: Secondary | ICD-10-CM | POA: Diagnosis not present

## 2024-08-07 DIAGNOSIS — E66811 Obesity, class 1: Secondary | ICD-10-CM

## 2024-08-07 MED ORDER — CEFTRIAXONE SODIUM 1 G IJ SOLR
1.0000 g | Freq: Once | INTRAMUSCULAR | Status: AC
  Administered 2024-08-07: 1 g via INTRAMUSCULAR

## 2024-08-07 MED ORDER — ZOLPIDEM TARTRATE 10 MG PO TABS: 10.0000 mg | ORAL_TABLET | Freq: Every day | ORAL | 2 refills | Status: DC

## 2024-08-07 MED ORDER — POTASSIUM CHLORIDE CRYS ER 20 MEQ PO TBCR: EXTENDED_RELEASE_TABLET | ORAL | 1 refills | Status: DC

## 2024-08-07 NOTE — Assessment & Plan Note (Signed)
  Orders:   Ambulatory referral to [facility]

## 2024-08-07 NOTE — Assessment & Plan Note (Addendum)
  Orders:   potassium chloride  SA (KLOR-CON  M) 20 MEQ tablet; Take 3 tablets in the morning, 2 tablets in the afternoon, 2 tablets at night.   Ambulatory referral to Genetics   Comprehensive metabolic panel with GFR
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
## 2024-08-07 NOTE — Assessment & Plan Note (Signed)
  Orders:   zolpidem  (AMBIEN ) 10 MG tablet; Take 1 tablet (10 mg total) by mouth at bedtime.

## 2024-08-07 NOTE — Assessment & Plan Note (Signed)
  Orders:   cefTRIAXone  (ROCEPHIN ) injection 1 g

## 2024-08-07 NOTE — Assessment & Plan Note (Signed)
  Orders:   Ambulatory referral to [facility]

## 2024-08-07 NOTE — Assessment & Plan Note (Signed)
  Orders:   Ambulatory referral to [facility]

## 2024-08-08 ENCOUNTER — Other Ambulatory Visit: Payer: Self-pay | Admitting: Physician Assistant

## 2024-08-08 DIAGNOSIS — F5101 Primary insomnia: Secondary | ICD-10-CM

## 2024-08-08 LAB — COMPREHENSIVE METABOLIC PANEL WITH GFR
ALT: 28 IU/L (ref 0–32)
AST: 30 IU/L (ref 0–40)
Albumin: 4.4 g/dL (ref 3.8–4.9)
Alkaline Phosphatase: 62 IU/L (ref 49–135)
BUN/Creatinine Ratio: 13 (ref 9–23)
BUN: 11 mg/dL (ref 6–24)
Bilirubin Total: 0.5 mg/dL (ref 0.0–1.2)
CO2: 25 mmol/L (ref 20–29)
Calcium: 9.6 mg/dL (ref 8.7–10.2)
Chloride: 91 mmol/L — ABNORMAL LOW (ref 96–106)
Creatinine, Ser: 0.88 mg/dL (ref 0.57–1.00)
Globulin, Total: 2.6 g/dL (ref 1.5–4.5)
Glucose: 169 mg/dL — ABNORMAL HIGH (ref 70–99)
Potassium: 3.2 mmol/L — ABNORMAL LOW (ref 3.5–5.2)
Sodium: 138 mmol/L (ref 134–144)
Total Protein: 7 g/dL (ref 6.0–8.5)
eGFR: 76 mL/min/1.73 (ref >59)

## 2024-08-08 MED ORDER — ZOLPIDEM TARTRATE 10 MG PO TABS: 10.0000 mg | ORAL_TABLET | Freq: Every day | ORAL | 0 refills | Status: AC

## 2024-08-09 ENCOUNTER — Encounter: Payer: Self-pay | Admitting: Physician Assistant

## 2024-08-09 DIAGNOSIS — R002 Palpitations: Secondary | ICD-10-CM | POA: Diagnosis not present

## 2024-08-09 LAB — ANTI-RO/NEG ANA: Anti-Ro (SS-A) Ab (RDL): <20 U (ref <20)

## 2024-08-09 LAB — ANA 12 PLUS PROFILE (RDL): Anti-Nuclear Ab by IFA (RDL): NEGATIVE

## 2024-08-09 NOTE — Telephone Encounter (Signed)
 Copied from CRM [id number]. Topic: Appointments - Appointment Info/Confirmation >> Aug 09, 2024 11:16 AM Darshell M wrote: Patient calling for AV from 10/7 visit. It is not visible in the chart. Patient needs AV for insurance. Maye CB# [phone]

## 2024-08-10 DIAGNOSIS — I959 Hypotension, unspecified: Secondary | ICD-10-CM | POA: Insufficient documentation

## 2024-08-10 NOTE — Assessment & Plan Note (Addendum)
 Episodes linked to low potassium levels, causing blackouts and inability to measure blood pressure. - Increase potassium supplementation to 140 mEq/day, divided into three morning doses and two later doses. - Monitor potassium levels to prevent hyperkalemia. - Consider outpatient IV potassium if oral supplementation is inadequate. - Explore insurance for emergency alert system. Orders:   Ambulatory referral to [facility]

## 2024-08-10 NOTE — Assessment & Plan Note (Signed)
 Type 2 diabetes with fasting blood sugar of 148 mg/dL, concern about insulin  dependency. - Continue Ozempic  0.25 mg, adjust to 0.5 mg based on tolerance. - Monitor daily blood sugar, focus on fasting levels. - Aim for fasting blood sugar below 120 mg/dL.

## 2024-08-10 NOTE — Assessment & Plan Note (Signed)
 Hashimoto's with potential thyroid  eye disease, awaiting ANA results. - Await ANA results. - Consider endocrinology referral.

## 2024-08-10 NOTE — Assessment & Plan Note (Signed)
 Obesity with focus on managing diabetes and hypertension. - Monitor weight and adjust treatment for comorbid condition.

## 2024-08-10 NOTE — Assessment & Plan Note (Signed)
 Recent exacerbation with finger discoloration. - Monitor symptoms and manage triggers.

## 2024-08-13 ENCOUNTER — Ambulatory Visit: Payer: Self-pay | Admitting: Physician Assistant

## 2024-08-13 ENCOUNTER — Telehealth (HOSPITAL_BASED_OUTPATIENT_CLINIC_OR_DEPARTMENT_OTHER): Payer: Self-pay | Admitting: *Deleted

## 2024-08-13 DIAGNOSIS — E876 Hypokalemia: Secondary | ICD-10-CM

## 2024-08-13 NOTE — Telephone Encounter (Signed)
   Pre-operative Risk Assessment    Patient Name: Laura [name]  DOB: [birth year] MRN: [id number]   Date of last office visit: 07/26/24 [facility] Date of next office visit: 09/25/24 RYAN DUNN, PAC   Request for Surgical Clearance    Procedure:  RIGHT TOTAL KNEE ARTHROPLASTY  Date of Surgery:  Clearance TBD                                Surgeon:  DR. JUDITHANN GLENDIA HUTCHINSON Surgeon's Group or Practice Name:  [facility] Phone number:  [phone] Fax number:  [phone]   Type of Clearance Requested:   - Medical ; NONE INDICTED ON FORM TO BE HELD   Type of Anesthesia:  Spinal   Additional requests/questions:    Bonney Niels Jest   08/13/2024, 11:40 AM

## 2024-08-14 ENCOUNTER — Ambulatory Visit: Admitting: Physician Assistant

## 2024-08-14 VITALS — BP 162/98 | HR 87 | Temp 97.8°F | Ht 65.0 in | Wt 187.0 lb

## 2024-08-14 DIAGNOSIS — R600 Localized edema: Secondary | ICD-10-CM | POA: Diagnosis not present

## 2024-08-14 DIAGNOSIS — M797 Fibromyalgia: Secondary | ICD-10-CM | POA: Diagnosis not present

## 2024-08-14 DIAGNOSIS — R3915 Urgency of urination: Secondary | ICD-10-CM

## 2024-08-14 DIAGNOSIS — E876 Hypokalemia: Secondary | ICD-10-CM

## 2024-08-14 DIAGNOSIS — F411 Generalized anxiety disorder: Secondary | ICD-10-CM

## 2024-08-14 DIAGNOSIS — I10 Essential (primary) hypertension: Secondary | ICD-10-CM | POA: Diagnosis not present

## 2024-08-14 DIAGNOSIS — E039 Hypothyroidism, unspecified: Secondary | ICD-10-CM

## 2024-08-14 DIAGNOSIS — B3731 Acute candidiasis of vulva and vagina: Secondary | ICD-10-CM | POA: Diagnosis not present

## 2024-08-14 MED ORDER — FLUCONAZOLE 150 MG PO TABS: 150.0000 mg | ORAL_TABLET | Freq: Once | ORAL | 0 refills | Status: AC

## 2024-08-14 NOTE — Assessment & Plan Note (Signed)
  Orders:   Thyroid  Panel With TSH   AMB Referral [facility]

## 2024-08-14 NOTE — Assessment & Plan Note (Addendum)
  Orders:   Comprehensive metabolic panel with GFR   AMB Referral [facility]
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
## 2024-08-14 NOTE — Assessment & Plan Note (Addendum)
  Orders:   AMB Referral VBCI Care Management

## 2024-08-15 ENCOUNTER — Encounter: Payer: Self-pay | Admitting: Physician Assistant

## 2024-08-15 ENCOUNTER — Telehealth: Payer: Self-pay

## 2024-08-15 LAB — COMPREHENSIVE METABOLIC PANEL WITH GFR
ALT: 25 IU/L (ref 0–32)
AST: 28 IU/L (ref 0–40)
Albumin: 4.4 g/dL (ref 3.8–4.9)
Alkaline Phosphatase: 64 IU/L (ref 49–135)
BUN/Creatinine Ratio: 13 (ref 9–23)
BUN: 10 mg/dL (ref 6–24)
Bilirubin Total: 0.3 mg/dL (ref 0.0–1.2)
CO2: 27 mmol/L (ref 20–29)
Calcium: 10.2 mg/dL (ref 8.7–10.2)
Chloride: 98 mmol/L (ref 96–106)
Creatinine, Ser: 0.8 mg/dL (ref 0.57–1.00)
Globulin, Total: 2.4 g/dL (ref 1.5–4.5)
Glucose: 95 mg/dL (ref 70–99)
Potassium: 3.9 mmol/L (ref 3.5–5.2)
Sodium: 140 mmol/L (ref 134–144)
Total Protein: 6.8 g/dL (ref 6.0–8.5)
eGFR: 85 mL/min/1.73 (ref >59)

## 2024-08-15 LAB — THYROID PANEL WITH TSH
Free Thyroxine Index: 0.8 — ABNORMAL LOW (ref 1.2–4.9)
T3 Uptake Ratio: 17 % — ABNORMAL LOW (ref 24–39)
T4, Total: 4.8 ug/dL (ref 4.5–12.0)
TSH: 37.2 u[IU]/mL — ABNORMAL HIGH (ref 0.450–4.500)

## 2024-08-15 NOTE — Assessment & Plan Note (Signed)
 Anxiety decreased since stopping levothyroxine . Ongoing internal stress, less intense. - Monitor anxiety symptoms in relation to thyroid  management.

## 2024-08-15 NOTE — Progress Notes (Signed)
 Complex Care Management Note  Care Guide Note 08/15/2024 Name: Laura [name] MRN: [id number] DOB: 04/24/1966  Laura [name] is a 58 y.o. year old female who sees [place], [place], [name] for primary care. I reached out to [facility] by phone today to offer complex care management services.  Ms. Maret was given information about Complex Care Management services today including:   The Complex Care Management services include support from the care team which includes your Nurse Care Manager, Clinical Social Worker, or Pharmacist.  The Complex Care Management team is here to help remove barriers to the health concerns and goals most important to you. Complex Care Management services are voluntary, and the patient may decline or stop services at any time by request to their care team member.   Complex Care Management Consent Status: Patient agreed to services and verbal consent obtained.   Follow up plan:  Telephone appointment with complex care management team member scheduled for:  08/16/24 & 08/30/24.  Encounter Outcome:  Patient Scheduled  Dreama Lynwood [facility]  [facility], [facility] VBCI Assistant Direct Dial: [phone]  Fax: [phone]

## 2024-08-15 NOTE — Assessment & Plan Note (Signed)
 Chronic fibromyalgia with joint pain, especially in hands. Symptoms may worsen with fluid retention.

## 2024-08-16 ENCOUNTER — Other Ambulatory Visit: Admitting: Licensed Clinical Social Worker

## 2024-08-16 ENCOUNTER — Other Ambulatory Visit: Payer: Self-pay | Admitting: Physician Assistant

## 2024-08-16 ENCOUNTER — Ambulatory Visit (HOSPITAL_COMMUNITY)
Admission: RE | Admit: 2024-08-16 | Discharge: 2024-08-16 | Disposition: A | Discharge status: Home / Self Care | Source: Ambulatory Visit | Attending: Cardiology | Admitting: Cardiology

## 2024-08-16 DIAGNOSIS — R0602 Shortness of breath: Secondary | ICD-10-CM | POA: Insufficient documentation

## 2024-08-16 DIAGNOSIS — R079 Chest pain, unspecified: Secondary | ICD-10-CM | POA: Diagnosis not present

## 2024-08-16 DIAGNOSIS — I10 Essential (primary) hypertension: Secondary | ICD-10-CM

## 2024-08-16 LAB — ECHOCARDIOGRAM COMPLETE
AR max vel: 2.17 cm2
AV Area VTI: 2.2 cm2
AV Area mean vel: 2.14 cm2
AV Mean grad: 5.5 mmHg
AV Peak grad: 9.9 mmHg
Ao pk vel: 1.57 m/s
Area-P 1/2: 3.91 cm2
P 1/2 time: 405 ms
S' Lateral: 2.2 cm

## 2024-08-17 MED ORDER — LEVOTHYROXINE SODIUM 88 MCG PO TABS
88.0000 ug | ORAL_TABLET | Freq: Every day | ORAL | 0 refills | Status: DC
Start: 2024-08-17 — End: 2024-09-10
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
## 2024-08-17 NOTE — Patient Outreach (Signed)
 Complex Care Management   Visit Note  08/16/2024  Name:  Laura [name] MRN: [id number] DOB: [birth year]  Situation: Referral received for Complex Care Management related to SDOH Barriers:  Food insecurity I obtained verbal consent from Patient.  Visit completed with Patient  on the phone  Background:   Past Medical History:  Diagnosis Date   Acute GI bleeding 09/01/2019   Anxiety and depression    Asthma    Barrett's esophagus 11/02/2016   Dr. Avram, GI   Bowel obstruction [facility])    Chest pain    a. 01/2016 Ex MV: Hypertensive response. Freq PVCs w/ exercise. nl EF. No ST/T changes. No ischemia.   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 03/2016 Echo: EF 60-65%, no rwma, mild MR, nl LA size, nl RV fxn.   Herpes zoster without complication 10/11/2021   High cholesterol    History of hiatal hernia    Hypertension    Kidney stones    Myofascial pain syndrome    Nasal septal perforation 05/12/2018   Hx of cocaine use   Palpitations    a. 03/2016 Holter: Sinus rhythm, avg HR 83, max 123, min 64. 4 PACs. 10,356 isolated PVCs, one vent couplet, 3842 V bigeminy, 4 beats NSVT->prev on BB - dc 2/2 swelling.   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 Took metformin to try to lessen   Raynaud disease    Rectocele    Shingles    Sleep apnea    mild per pt   Status post hysterectomy 03/08/2017   Torn rotator cuff    left   Urinary retention with incomplete bladder emptying    Vaginal dryness, menopausal    Vaginal enterocele    Vitamin B12 deficiency 07/26/2016   Vitamin D  deficiency 12/03/2014   Wears hearing aid in both ears     Assessment:   SDOH Interventions    Flowsheet Row Patient Outreach Telephone from 08/16/2024 in [place] [facility]PART[facility] Office Visit from  05/22/2024 in [facility] Office Visit from 02/16/2024 in [facility] Clinical Support from 09/13/2023 in [facility] Office Visit from 06/14/2023 in [facility] Office Visit from 05/24/2023 in [facility]  SDOH Interventions        Food Insecurity Interventions Community Resources Provided  [employer] send th eresources for food and the link for FNS] -- -- Intervention Not Indicated Intervention Not Indicated --  Housing Interventions Intervention Not Indicated -- -- Intervention Not Indicated -- --  Transportation Interventions Intervention Not Indicated -- -- Intervention Not Indicated -- --  Utilities Interventions Intervention Not Indicated -- -- Intervention Not Indicated -- --  Depression Interventions/Treatment  -- Currently on Treatment Medication -- -- --  Financial Strain Interventions -- -- -- Intervention Not Indicated -- --  Physical Activity Interventions -- -- -- Intervention Not Indicated -- --  Stress Interventions -- -- -- Intervention Not Indicated -- --  Social Connections Interventions -- -- -- Intervention Not Indicated -- --  Health Literacy Interventions -- -- -- Intervention Not Indicated -- Intervention Not Indicated    Recommendation:   none  Follow Up Plan:   Telephone follow up appointment  date/time:  08/31/2024 at 3:00 pm  Tobias CHARM Maranda HEDWIG, PhD [facility], [facility] Social Worker Direct Dial: [phone]  Fax: [phone]

## 2024-08-20 ENCOUNTER — Encounter: Attending: Physical Medicine and Rehabilitation | Admitting: Physical Medicine and Rehabilitation

## 2024-08-20 ENCOUNTER — Encounter: Payer: Self-pay | Admitting: Physician Assistant

## 2024-08-20 ENCOUNTER — Ambulatory Visit: Payer: Self-pay | Admitting: Physician Assistant

## 2024-08-20 ENCOUNTER — Encounter: Payer: Self-pay | Admitting: Licensed Clinical Social Worker

## 2024-08-20 ENCOUNTER — Encounter: Payer: Self-pay | Admitting: Physical Medicine and Rehabilitation

## 2024-08-20 VITALS — BP 144/89 | HR 86 | Ht 65.0 in | Wt 185.0 lb

## 2024-08-20 DIAGNOSIS — G894 Chronic pain syndrome: Secondary | ICD-10-CM | POA: Insufficient documentation

## 2024-08-20 DIAGNOSIS — M7918 Myalgia, other site: Secondary | ICD-10-CM | POA: Diagnosis not present

## 2024-08-20 DIAGNOSIS — G8929 Other chronic pain: Secondary | ICD-10-CM | POA: Diagnosis not present

## 2024-08-20 DIAGNOSIS — E063 Autoimmune thyroiditis: Secondary | ICD-10-CM | POA: Diagnosis not present

## 2024-08-20 DIAGNOSIS — Z5181 Encounter for therapeutic drug level monitoring: Secondary | ICD-10-CM | POA: Insufficient documentation

## 2024-08-20 MED ORDER — TIZANIDINE HCL 2 MG PO TABS: 2.0000 mg | ORAL_TABLET | Freq: Four times a day (QID) | ORAL | 5 refills | Status: AC | PRN

## 2024-08-20 NOTE — Patient Outreach (Signed)
 Complex Care Management   Visit Note  08/17/2024  Name:  Laura [name] MRN: [id number] DOB: October 22, 1966  Situation: Referral received for Complex Care Management related to SDOH Barriers:  Food insecurity I obtained verbal consent from Patient.  Visit completed with Patient  on the phone  Background:   Past Medical History:  Diagnosis Date   Acute GI bleeding 09/01/2019   Anxiety and depression    Asthma    Barrett's esophagus 11/02/2016   Dr. Avram, GI   Bowel obstruction [facility])    Chest pain    a. 01/2016 Ex MV: Hypertensive response. Freq PVCs w/ exercise. nl EF. No ST/T changes. No ischemia.   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 03/2016 Echo: EF 60-65%, no rwma, mild MR, nl LA size, nl RV fxn.   Herpes zoster without complication 10/11/2021   High cholesterol    History of hiatal hernia    Hypertension    Kidney stones    Myofascial pain syndrome    Nasal septal perforation 05/12/2018   Hx of cocaine use   Palpitations    a. 03/2016 Holter: Sinus rhythm, avg HR 83, max 123, min 64. 4 PACs. 10,356 isolated PVCs, one vent couplet, 3842 V bigeminy, 4 beats NSVT->prev on BB - dc 2/2 swelling.   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 Took metformin to try to lessen   Raynaud disease    Rectocele    Shingles    Sleep apnea    mild per pt   Status post hysterectomy 03/08/2017   Torn rotator cuff    left   Urinary retention with incomplete bladder emptying    Vaginal dryness, menopausal    Vaginal enterocele    Vitamin B12 deficiency 07/26/2016   Vitamin D  deficiency 12/03/2014   Wears hearing aid in both ears     Assessment: Patient needs to apply for food stamps, SW will send the E-pass link to apply   SDOH Interventions    Flowsheet Row Patient Outreach Telephone from  08/16/2024 in [place] [facility]ULATION HEALTH DEPARTMENT Office Visit from 05/22/2024 in [facility] Office Visit from 02/16/2024 in [facility] Clinical Support from 09/13/2023 in [facility] [facility] Office Visit from 06/14/2023 in [facility] Office Visit from 05/24/2023 in [facility]  SDOH Interventions        Food Insecurity Interventions Community Resources Provided  [employer] send th eresources for food and the link for FNS] -- -- Intervention Not Indicated Intervention Not Indicated --  Housing Interventions Intervention Not Indicated -- -- Intervention Not Indicated -- --  Transportation Interventions Intervention Not Indicated -- -- Intervention Not Indicated -- --  Utilities Interventions Intervention Not Indicated -- -- Intervention Not Indicated -- --  Depression Interventions/Treatment  -- Currently on Treatment Medication -- -- --  Financial Strain Interventions -- -- -- Intervention Not Indicated -- --  Physical Activity Interventions -- -- -- Intervention Not Indicated -- --  Stress Interventions -- -- -- Intervention Not Indicated -- --  Social Connections Interventions -- -- -- Intervention Not Indicated -- --  Health Literacy Interventions -- -- -- Intervention Not Indicated -- Intervention Not Indicated  Recommendation:   none  Follow Up Plan:   Telephone follow up appointment date/time:  08/31/2024 3:00 pm  Tobias CHARM Maranda HEDWIG, PhD [facility], [name] Social Worker Direct Dial: [phone]  Fax: [phone]

## 2024-08-20 NOTE — Patient Outreach (Signed)
 08/20/2024  Patient called and needed the E-Pass for the food stamp, and the SW emailed it to her while on the call, SW was having computer issues on the day of the patients appointment. SW reminded patient to call back if she needed any help or had any questions.  Laura CHARM Maranda HEDWIG, PhD [facility] Social Worker Direct Dial: [phone]  Fax: [phone]

## 2024-08-20 NOTE — Patient Instructions (Signed)
 Visit Information  Thank you for taking time to visit with me today. Please don't hesitate to contact me if I can be of assistance to you before our next scheduled appointment.  Our next appointment is by telephone on 08/31/2024 at 3:00 pm Please call the care guide team at [phone] if you need to cancel or reschedule your appointment.   Following is a copy of your care plan:   Goals Addressed             This Visit's Progress    BSW VBCI Social Work Care Plan       Problems:   Food Insecurity   CSW Clinical Goal(s):   Over the next 2 weeks the Patient will will follow up with FNS application as directed by Social Work.  Interventions:  SW will send the FNS application and link  Patient Goals/Self-Care Activities:  Follow up with FNS application  for community food options.  Plan:   Telephone follow up appointment with care management team member scheduled for:  08-31-2024 at 3:00 am        Please call the Suicide and Crisis Lifeline: 988 go to [facility] [address], [place] [phone]) call 911 if you are experiencing a Mental Health or Behavioral Health Crisis or need someone to talk to.  Patient verbalizes understanding of instructions and care plan provided today and agrees to view in MyChart. Active MyChart status and patient understanding of how to access instructions and care plan via MyChart confirmed with patient.     Tobias CHARM Maranda HEDWIG, PhD [facility], [facility] Social Worker Direct Dial: [phone]  Fax: [phone]

## 2024-08-20 NOTE — Patient Instructions (Addendum)
 Pt is a 58 yr old female with hx of HTN, moderate persistent asthma; hypothyroidism- latest TSH 1.41, fibromyalgia, Migraines, and BMI 31; Told she has Kidney disease? Thinks due to swelling up so much- -  Latest BUN/Cr 22/0.77.  Has many allergies 30 meds- also has IBS Here for evaluation for pain.  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  Also has trochanteric bursitis.  F/U on chronic pain syndrome.     Went over ECHO- has moderate aortic regurgitation- but EF looks great- 60-65% and nothing else except mild grade 1 diastolic dysfunction.   2. I agree pt needs to see Endocrinology-  for Hashimoto's-  and heart pounding- intermittently- when TSH is borderline low.  I tell most patients to keep your TSH around 1- keeps your pain in better control.    3.   Has 20 dilaudid  right now- will eventually need a refill-  but not right now. Con't dilaudid .   4.  Needs refill of Tizanidine -  2-4 mg q6 hours prn-  #240- 5 refills.   5.  We discussed needs to see  Endo- and Hashimoto's- but really need to see them.   6. Needs CRP and ESR- also needs a more full Rheumatologic work up- I agree with Rheumatology appt.  If they are negative, less likely to have inflammatory component.   7. Wondering if has another Rheum dx, that we are missing.  But the work up hasn't been completed yet.   8. F/U  3 months- f/u  on chronic pain syndrome

## 2024-08-21 ENCOUNTER — Other Ambulatory Visit: Payer: Self-pay | Admitting: Physician Assistant

## 2024-08-21 DIAGNOSIS — E039 Hypothyroidism, unspecified: Secondary | ICD-10-CM

## 2024-08-21 DIAGNOSIS — M159 Polyosteoarthritis, unspecified: Secondary | ICD-10-CM

## 2024-08-22 ENCOUNTER — Encounter: Payer: Self-pay | Admitting: Physician Assistant

## 2024-08-23 ENCOUNTER — Ambulatory Visit (INDEPENDENT_AMBULATORY_CARE_PROVIDER_SITE_OTHER): Admitting: Family

## 2024-08-23 ENCOUNTER — Encounter (HOSPITAL_BASED_OUTPATIENT_CLINIC_OR_DEPARTMENT_OTHER): Payer: Self-pay | Admitting: Family

## 2024-08-23 ENCOUNTER — Other Ambulatory Visit: Payer: Self-pay | Admitting: Physician Assistant

## 2024-08-23 VITALS — BP 162/98 | HR 80 | Ht 65.0 in | Wt 185.1 lb

## 2024-08-23 DIAGNOSIS — J0101 Acute recurrent maxillary sinusitis: Secondary | ICD-10-CM

## 2024-08-23 DIAGNOSIS — R400 Somnolence: Secondary | ICD-10-CM | POA: Diagnosis not present

## 2024-08-23 DIAGNOSIS — I10 Essential (primary) hypertension: Secondary | ICD-10-CM | POA: Diagnosis not present

## 2024-08-23 LAB — DRUG TOX MONITOR 1 W/CONF, ORAL FLD
Amphetamines: NEGATIVE ng/mL (ref <10)
Barbiturates: NEGATIVE ng/mL (ref <10)
Benzodiazepines: NEGATIVE ng/mL (ref <0.50)
Buprenorphine: NEGATIVE ng/mL (ref <0.10)
Cocaine: NEGATIVE ng/mL (ref <5.0)
Fentanyl: NEGATIVE ng/mL (ref <0.10)
Heroin Metabolite: NEGATIVE ng/mL (ref <1.0)
MARIJUANA: NEGATIVE ng/mL (ref <2.5)
MDMA: NEGATIVE ng/mL (ref <10)
Meprobamate: NEGATIVE ng/mL (ref <2.5)
Methadone: NEGATIVE ng/mL (ref <5.0)
Nicotine Metabolite: NEGATIVE ng/mL (ref <5.0)
Opiates: NEGATIVE ng/mL (ref <2.5)
Phencyclidine: NEGATIVE ng/mL (ref <10)
Tapentadol: NEGATIVE ng/mL (ref <5.0)
Tramadol: NEGATIVE ng/mL (ref <5.0)
Zolpidem: 10.1 ng/mL — ABNORMAL HIGH (ref <5.0)
Zolpidem: POSITIVE ng/mL — AB (ref <5.0)

## 2024-08-23 LAB — DRUG TOX ALC METAB W/CON, ORAL FLD: Alcohol Metabolite: NEGATIVE ng/mL (ref <25)

## 2024-08-23 MED ORDER — CARVEDILOL 25 MG PO TABS: 25.0000 mg | ORAL_TABLET | Freq: Two times a day (BID) | ORAL | 1 refills | Status: DC

## 2024-08-23 MED ORDER — LEVOFLOXACIN 500 MG PO TABS: 500.0000 mg | ORAL_TABLET | Freq: Every day | ORAL | 0 refills | Status: AC

## 2024-08-23 NOTE — Patient Instructions (Addendum)
 Medication Instructions:  CHANGE Carvedilol  to 25mg  twice per day  * Okay to use up your 12.5mg  tablets by taking two twice per day  Labwork: Your physician recommends that you return for lab work today: renin-aldosterone ratio    Follow-Up: Please follow up in 3 months in [facility]IN[facility] with Dr. Raford, Reche Finder, NP or Allean Mink PharmD    Special Instructions:    WatchPAT?  Is a FDA cleared portable home sleep study test that uses a watch and 3 points of contact to monitor 7 different channels, including your heart rate, oxygen saturations, body position, snoring, and chest motion.  The study is easy to use from the comfort of your own home and accurately detect sleep apnea.  Before bed, you attach the chest sensor, attached the sleep apnea bracelet to your nondominant hand, and attach the finger probe.  After the study, the raw data is downloaded from the watch and scored for apnea events.   For more information: [email or web address].   We have ordered this study and once approved by insurance it will be mailed to your home. This may take a few weeks to be approved by your insurance.
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
## 2024-08-27 ENCOUNTER — Other Ambulatory Visit: Payer: Self-pay | Admitting: Physician Assistant

## 2024-08-27 ENCOUNTER — Other Ambulatory Visit

## 2024-08-27 DIAGNOSIS — M159 Polyosteoarthritis, unspecified: Secondary | ICD-10-CM | POA: Diagnosis not present

## 2024-08-27 DIAGNOSIS — E039 Hypothyroidism, unspecified: Secondary | ICD-10-CM | POA: Diagnosis not present

## 2024-08-27 DIAGNOSIS — I7300 Raynaud's syndrome without gangrene: Secondary | ICD-10-CM

## 2024-08-27 DIAGNOSIS — K117 Disturbances of salivary secretion: Secondary | ICD-10-CM

## 2024-08-28 ENCOUNTER — Encounter (HOSPITAL_BASED_OUTPATIENT_CLINIC_OR_DEPARTMENT_OTHER): Payer: Self-pay

## 2024-08-28 ENCOUNTER — Encounter: Payer: Self-pay | Admitting: Orthopedic Surgery

## 2024-08-28 LAB — URIC ACID: Uric Acid: 6.2 mg/dL (ref 3.0–7.2)

## 2024-08-28 LAB — COMPREHENSIVE METABOLIC PANEL WITH GFR
ALT: 19 IU/L (ref 0–32)
AST: 23 IU/L (ref 0–40)
Albumin: 4.4 g/dL (ref 3.8–4.9)
Alkaline Phosphatase: 75 IU/L (ref 49–135)
BUN/Creatinine Ratio: 16 (ref 9–23)
BUN: 12 mg/dL (ref 6–24)
Bilirubin Total: 0.5 mg/dL (ref 0.0–1.2)
CO2: 27 mmol/L (ref 20–29)
Calcium: 10.7 mg/dL — ABNORMAL HIGH (ref 8.7–10.2)
Chloride: 97 mmol/L (ref 96–106)
Creatinine, Ser: 0.76 mg/dL (ref 0.57–1.00)
Globulin, Total: 2.5 g/dL (ref 1.5–4.5)
Glucose: 108 mg/dL — ABNORMAL HIGH (ref 70–99)
Potassium: 3.8 mmol/L (ref 3.5–5.2)
Sodium: 140 mmol/L (ref 134–144)
Total Protein: 6.9 g/dL (ref 6.0–8.5)
eGFR: 91 mL/min/1.73 (ref >59)

## 2024-08-28 LAB — TSH: TSH: 12.6 u[IU]/mL — ABNORMAL HIGH (ref 0.450–4.500)

## 2024-08-28 LAB — SEDIMENTATION RATE: Sed Rate: 30 mm/h (ref 0–40)

## 2024-08-28 LAB — T4, FREE: Free T4: 1.23 ng/dL (ref 0.82–1.77)

## 2024-08-28 LAB — RHEUMATOID FACTOR: Rheumatoid fact SerPl-aCnc: 11.3 [IU]/mL (ref <14.0)

## 2024-08-28 LAB — C-REACTIVE PROTEIN: CRP: 7 mg/L (ref 0–10)

## 2024-08-28 MED ORDER — PRAZOSIN HCL 2 MG PO CAPS: 2.0000 mg | ORAL_CAPSULE | Freq: Two times a day (BID) | ORAL | 0 refills | Status: DC

## 2024-08-28 NOTE — Telephone Encounter (Signed)
 Recommendations sent on behalf of Laura Finder, NP to the pt via mychart.   Informed the pt via mychart that I will send in increased Prazosin  2mg  bid to her pharmacy on file.

## 2024-08-28 NOTE — Telephone Encounter (Signed)
 Continue increased dose carvedilol  25mg  BID. Increase Prazosin  to 2mg  BID.   Xaden Kaufman S Merel Santoli, NP

## 2024-08-29 ENCOUNTER — Ambulatory Visit: Payer: Self-pay | Admitting: Physician Assistant

## 2024-08-29 DIAGNOSIS — E876 Hypokalemia: Secondary | ICD-10-CM

## 2024-08-29 DIAGNOSIS — K117 Disturbances of salivary secretion: Secondary | ICD-10-CM

## 2024-08-29 DIAGNOSIS — I7300 Raynaud's syndrome without gangrene: Secondary | ICD-10-CM

## 2024-08-29 LAB — ALDOSTERONE + RENIN ACTIVITY W/ RATIO
Aldos/Renin Ratio: 3.3 (ref 0.0–30.0)
Aldosterone: 16 ng/dL (ref 0.0–30.0)
Renin Activity, Plasma: 4.842 ng/mL/h (ref 0.167–5.380)

## 2024-08-29 MED ORDER — POTASSIUM CHLORIDE CRYS ER 20 MEQ PO TBCR: EXTENDED_RELEASE_TABLET | ORAL | 1 refills | Status: DC

## 2024-08-30 ENCOUNTER — Ambulatory Visit (HOSPITAL_BASED_OUTPATIENT_CLINIC_OR_DEPARTMENT_OTHER): Payer: Self-pay | Admitting: Family

## 2024-08-30 ENCOUNTER — Other Ambulatory Visit: Payer: Self-pay

## 2024-08-30 NOTE — Patient Outreach (Signed)
 Complex Care Management   Visit Note  08/30/2024  Name:  Laura [name] MRN: [id number] DOB: [birth year]  Situation: Referral received for Complex Care Management related to {Criteria:32550} I obtained verbal consent from {CHL AMB Patient/Caregiver:28184}.  Visit completed with {CHL AMB Patient/Caregiver:28184}  {VISIT LOCATION:32553}  Background:   Past Medical History:  Diagnosis Date   Acute GI bleeding 09/01/2019   Anxiety and depression    Asthma    Barrett's esophagus 11/02/2016   Dr. Avram, GI   Bowel obstruction [facility])    Chest pain    a. 01/2016 Ex MV: Hypertensive response. Freq PVCs w/ exercise. nl EF. No ST/T changes. No ischemia.   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 03/2016 Echo: EF 60-65%, no rwma, mild MR, nl LA size, nl RV fxn.   Herpes zoster without complication 10/11/2021   High cholesterol    History of hiatal hernia    Hypertension    Kidney stones    Myofascial pain syndrome    Nasal septal perforation 05/12/2018   Hx of cocaine use   Palpitations    a. 03/2016 Holter: Sinus rhythm, avg HR 83, max 123, min 64. 4 PACs. 10,356 isolated PVCs, one vent couplet, 3842 V bigeminy, 4 beats NSVT->prev on BB - dc 2/2 swelling.   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 Took metformin to try to lessen   Raynaud disease    Rectocele    Shingles    Sleep apnea    mild per pt   Status post hysterectomy 03/08/2017   Torn rotator cuff    left   Urinary retention with incomplete bladder emptying    Vaginal dryness, menopausal    Vaginal enterocele    Vitamin B12 deficiency 07/26/2016   Vitamin D  deficiency 12/03/2014   Wears hearing aid in both ears     Assessment: Patient Reported Symptoms:  Cognitive Cognitive Status: No symptoms reported, Alert and oriented to  person, place, and time      Neurological Neurological Review of Symptoms: Other: [place] Neurological Symptoms/Conditions [RPT]: history of fibromyalgia- tingling, pins and needles pain, patient reports a little worse with weather right now. Neurological Management Strategies: Medication therapy, Fluid modification, Adequate rest, Routine screening, Activity Neurological Self-Management Outcome: 3 (uncertain)  HEENT HEENT Symptoms Reported:  (patient reports has fluid in right ear- going on a couple of months. Patient reports managed with antibiotics by PCP)      Cardiovascular Cardiovascular Symptoms Reported: No symptoms reported Does patient have uncontrolled Hypertension?: Yes Is patient checking Blood Pressure at home?: Yes Patient's Recent BP reading at home: patient reports checks BP 121/78 this HR Cardiovascular Management Strategies: Medication therapy, Routine screening  Respiratory Respiratory Symptoms Reported: No symptoms reported    Endocrine Endocrine Symptoms Reported: No symptoms reported Is patient diabetic?: Yes (manageing with diet and ozempic ) Is patient checking blood sugars at home?: Yes List most recent blood sugar readings, include date and time of day: BS 126 this morning fasting. Endocrine Self-Management Outcome: 4 (good)  Gastrointestinal Gastrointestinal Symptoms Reported: No symptoms reported Additional Gastrointestinal Details: h/o irritable has diarrhea several times/week. denies episode at this time. Gastrointestinal Management Strategies: Diet modification (avoids glutein) Gastrointestinal Self-Management Outcome: 4 (good)    Genitourinary Genitourinary Symptoms Reported:  No symptoms reported    Integumentary Integumentary Symptoms Reported: Rash Additional Integumentary Details: tmep dermatology-uses cream. patient reports controlled at this time. per patient depends on the mast cells. Skin Management Strategies: Routine screening, Medication  therapy Skin Self-Management Outcome: 4 (good)  Musculoskeletal Musculoskelatal Symptoms Reviewed: Back pain, Joint pain, Muscle pain, Difficulty walking, Limited mobility Additional Musculoskeletal Details: knee surgery pending        Psychosocial Psychosocial Symptoms Reported: Anxiety - if selected complete GAD, Depression - if selected complete PHQ 2-9 Additional Psychological Details: patient reports is currently active with therapist weekly- report she feels the benefit.     Quality of Family Relationships: non-existent Do you feel physically threatened by others?: No    08/30/2024    PHQ2-9 Depression Screening   Little interest or pleasure in doing things More than half the days  Feeling down, depressed, or hopeless Several days  PHQ-2 - Total Score 3  Trouble falling or staying asleep, or sleeping too much Nearly every day  Feeling tired or having little energy Nearly every day  Poor appetite or overeating  Not at all  Feeling bad about yourself - or that you are a failure or have let yourself or your family down Not at all  Trouble concentrating on things, such as reading the newspaper or watching television Several days  Moving or speaking so slowly that other people could have noticed.  Or the opposite - being so fidgety or restless that you have been moving around a lot more than usual Nearly every day  Thoughts that you would be better off dead, or hurting yourself in some way Not at all  PHQ2-9 Total Score 13  If you checked off any problems, how difficult have these problems made it for you to do your work, take care of things at home, or get along with other people Somewhat difficult  Depression Interventions/Treatment Currently on Treatment    Vitals:   08/30/24 1039  BP: 121/78  Pulse: 77    Medications Reviewed Today     Reviewed by Yamile Roedl M, RN (Registered Nurse) on 08/30/24 at 1025  Med List Status: <None>   Medication Order Taking? Sig  Documenting Provider Last Dose Status Informant  albuterol  (VENTOLIN  HFA) 108 (90 Base) MCG/ACT inhaler [id number] Yes Inhale 2 puffs into the lungs every 4 (four) hours as needed for wheezing or shortness of breath. Tapia, Leisa, PA-C  Active Self  ALPRAZolam  (XANAX ) 1 MG tablet [id number] Yes Take 1 tablet (1 mg total) by mouth 3 (three) times daily. Milon Cleaves, [name]  Active Self  budeson-glycopyrrolate -formoterol  (BREZTRI  AEROSPHERE) 160-9-4.8 MCG/ACT AERO inhaler [id number] Yes Inhale 2 puffs into the lungs 2 (two) times daily. Milon Cleaves, PA  Active Self  carvedilol  (COREG ) 25 MG tablet [id number] Yes Take 1 tablet (25 mg total) by mouth 2 (two) times daily with a meal. Walker, Caitlin S, NP  Active   chlorthalidone  (HYGROTON ) 25 MG tablet [id number] Yes TAKE ONE TABLET BY MOUTH EVERY DAY Milon Cleaves, [name]  Active   EPINEPHrine  0.3 mg/0.3 mL IJ SOAJ injection [id number] Yes Inject 0.3 mLs (0.3 mg total) into the muscle as needed for anaphylaxis. Abran Jerilynn Loving, MD  Active Self  Evolocumab  (REPATHA  SURECLICK) 140 MG/ML [name] [id number] Yes Inject 140 mg into the skin every 14 (fourteen) days. [provider]  Active Self  fluconazole  (DIFLUCAN ) 150 MG tablet [id number]  Take 150 mg by mouth once.  Patient not taking: Reported on 08/30/2024   [provider]  Active   furosemide  (LASIX ) 20 MG tablet [id number] Yes TAKE ONE TABLET BY MOUTH TWICE DAILY Craft, Nola, [name]  Active   glucose blood (ACCU-CHEK GUIDE) test strip [id number]  1 each by Other route daily in the afternoon. Use as instructed Abran Jerilynn Loving, MD  Active   HYDROmorphone  (DILAUDID ) 2 MG tablet [id number] Yes Take 1 tablet (2 mg total) by mouth 3 (three) times daily as needed for moderate pain (pain score 4-6) (pains score 4-6). Lovorn, Megan, MD  Active Self           Med Note ANICE, ROBBIN M   Mon Aug 20, 2024  2:49 PM) Last taken 2-3 days ago Last filled 05/23/2024 for 75   On hand #20   ipratropium-albuterol  (DUONEB) 0.5-2.5 (3) MG/3ML SOLN [id number] Yes Take 3 mLs by nebulization every 4 (four) hours as needed. Charlene Clotilda PARAS, NP  Active Self  levofloxacin (LEVAQUIN) 500 MG tablet [id number] Yes Take 1 tablet (500 mg total) by mouth daily for 7 days. Milon Nola, PA  Active   levothyroxine  (SYNTHROID ) 88 MCG tablet [id number] Yes Take 1 tablet (88 mcg total) by mouth daily. Milon Nola, PA  Active   methylPREDNISolone  (MEDROL ) 4 MG tablet [id number]  Take 1 tablet (4 mg total) by mouth daily.  Patient not taking: Reported on 08/30/2024   Milon Nola, [name]  Active Self  nystatin  powder [id number] Yes Apply 1 Application topically 2 (two) times daily as needed (for skin irritation- affected areas). [provider]  Active Self  ondansetron  (ZOFRAN -ODT) 4 MG disintegrating tablet [id number] Yes Take 1 tablet (4 mg total) by mouth every 8 (eight) hours as needed for nausea or vomiting. Milon Nola, PA  Active Self  potassium chloride  SA (KLOR-CON  M) 20 MEQ tablet [id number] Yes Take 3 tablets in the morning, 2 tablets in the afternoon, 3 tablets at night. Milon Nola, PA  Active   prazosin  (MINIPRESS ) 2 MG capsule [id number] Yes Take 1 capsule (2 mg total) by mouth 2 (two) times daily. Vannie Reche RAMAN, NP  Active   promethazine  (PHENERGAN ) 25 MG tablet [id number] Yes Take 1 tablet (25 mg total) by mouth every 6 (six) hours as needed for nausea or vomiting. Mannie Fairy DASEN, DO  Active Self  Ruxolitinib  Phosphate (OPZELURA ) 1.5 % CREA [id number] Yes Apply sparingly twice daily if needed Milon Nola, [name]  Active Self  Semaglutide ,0.25 or 0.5MG /DOS, (OZEMPIC , 0.25 OR 0.5 MG/DOSE,) 2 MG/3ML SOPN [id number] Yes INJECT 0.25 MG SUBCUTANEOUSLY  WEEKLY FOR 4 WEEKS, THEN 0.5 MG  WEEKLY THEREAFTER Milon Nola, [name]  Active   tiZANidine  (ZANAFLEX ) 2 MG tablet [id number] Yes Take 1-2 tablets (2-4 mg total) by mouth every 6 (six) hours as needed for muscle spasms. Lovorn, Megan, MD  Active    Ubrogepant  (UBRELVY ) 100 MG TABS [id number] Yes Take 1 tablet (100 mg total) by mouth 2 (two) times daily as needed (Take one and if symptoms persist after 2 hours take a second one. Do not take more than 2 in 24 hours). Milon Nola, PA  Active   Vitamin D , Ergocalciferol , (DRISDOL ) 1.25 MG (50000 UNIT) CAPS capsule [id number] Yes Take 1 capsule (50,000 Units total) by mouth every 7 (seven) days. Milon Nola, PA  Active Self  Vonoprazan Fumarate  (VOQUEZNA ) 10 MG TABS [id number] Yes Take 10 mg by mouth daily. Milon Nola, PA  Active Self  zolpidem  (AMBIEN ) 10 MG tablet [id number] Yes Take 1 tablet (10 mg total) by mouth at bedtime. Milon Nola, [name]  Active             Recommendation:   {RECOMMENDATONS:32554}  Follow Up Plan:   {FOLLOWUP:32559}  SIG ***

## 2024-08-31 ENCOUNTER — Encounter (HOSPITAL_BASED_OUTPATIENT_CLINIC_OR_DEPARTMENT_OTHER): Payer: Self-pay

## 2024-08-31 ENCOUNTER — Encounter: Payer: Self-pay | Admitting: Licensed Clinical Social Worker

## 2024-08-31 ENCOUNTER — Encounter: Payer: Self-pay | Admitting: Physician Assistant

## 2024-08-31 ENCOUNTER — Other Ambulatory Visit: Payer: Self-pay | Admitting: Licensed Clinical Social Worker

## 2024-08-31 NOTE — Patient Instructions (Signed)
 Arlean A Hegel - I am sorry I was unable to reach you today for our scheduled appointment. I work with Milon Cleaves, PA and am calling to support your healthcare needs. Please contact me at [phone] at your earliest convenience. I look forward to speaking with you soon.   Thank you,  Tobias CHARM Maranda HEDWIG, PhD [facility], [facility] Social Worker Direct Dial: [phone]  Fax: [phone]

## 2024-08-31 NOTE — Telephone Encounter (Signed)
 FYI needs clearance at upcoming OV. Updated appt notes.   Laura Schiano S Olson Lucarelli, NP

## 2024-08-31 NOTE — Patient Instructions (Addendum)
 Visit Information  Thank you for taking time to visit with me today. Please don't hesitate to contact me if I can be of assistance to you before our next scheduled appointment.  Our next appointment is by telephone on 09/12/24 at  at 10:30 am Please call the care guide team at [phone] if you need to cancel or reschedule your appointment.   Following is a copy of your care plan:   Goals Addressed             This Visit's Progress    VBCI RN Care Plan       Problems:  Chronic Disease Management support and education needs related to HTN Fall risk  Upcoming appointments: 09/05/24 PCP Follow up 09/07/24 oncology 09/21/24 endocrinology 11/25 cardiology 10/02/24 rheumatology new patent  Goal: Over the next 90 days the Patient will continue to work with RN Care Manager and/or Social Worker to address care management and care coordination needs related to HTN as evidenced by adherence to care management team scheduled appointments     demonstrate Ongoing adherence to prescribed treatment plan for HTN as evidenced by patient report and/or review of chart take all medications exactly as prescribed and will call provider for medication related questions as evidenced by patient report and/or review of chart     Interventions:   Hypertension Interventions: Last practice recorded BP readings:  BP Readings from Last 3 Encounters:  08/30/24 121/78  08/23/24 (!) 162/98  08/20/24 (!) 144/89   Most recent eGFR/CrCl:  Lab Results  Component Value Date   EGFR 91 08/27/2024    No components found for: CRCL  Evaluation of current treatment plan related to hypertension self management and patient's adherence to plan as established by provider Reviewed medications with patient and discussed importance of compliance Screening for signs and symptoms of depression related to chronic disease state  Assessed social determinant of health barriers RNCM offered education regarding HTN-patient  declines at this time. Advised to continue to monitor BP/RN. Provided positive feedback with patient self management of heatlh Encouraged continue follow up with providers as scheduled/recommended and discussed importance of management of other chronic conditions(hypothryroidism, Hashimotos, autoimmune disease, fibromyalgia, chronic pain).  Fall risk interventions: Discussed fall prevention strategies: advised to change position slowly, use assistive device as recommended, remove tripping hazards, have good lighting in the room. Discussed how pain, chronic conditions and patient pending knee surgery and medications can make patient more prone for falling. Provided fall prevention education  Patient Self-Care Activities:  Attend all scheduled provider appointments Call pharmacy for medication refills 3-7 days in advance of running out of medications Call provider office for new concerns or questions  Take medications as prescribed    Plan:  Telephone follow up appointment with care management team member scheduled for:  09/12/24 at 10:30 am      Please call the Suicide and Crisis Lifeline: 988 call the [place]  National Suicide Prevention Lifeline: (319)413-4085 or TTY: 515-494-7610 TTY 256 582 4937) to talk to a trained counselor if you are experiencing a Mental Health or Behavioral Health Crisis or need someone to talk to.  Patient verbalized understanding of Care plan and visit instructions communicated this visit  Heddy Shutter, RN, MSN, BSN, CCM   [facility], Population Health Case Manager Phone: [phone]

## 2024-08-31 NOTE — Patient Outreach (Signed)
 Social Drivers of Health  Community Resource and Care Coordination Visit Note   08/31/2024  Name: ANNI HOCEVAR MRN: [id number] DOB:June 24, 1966  Situation: Referral received for [facility] needs assessment and assistance related to Food Insecurity . I obtained verbal consent from Patient.  Visit completed with Patient on the phone.   Background:   SDOH Interventions Today    Flowsheet Row Most Recent Value  SDOH Interventions   Food Insecurity Interventions Community Resources Provided  [Patient has applied for food stamps]     Assessment:   Goals Addressed             This Visit's Progress    BSW VBCI Social Work Care Plan       Problems:   Food Insecurity   CSW Clinical Goal(s):   Over the next 2 weeks the Patient will will follow up with FNS application as directed by Social Work.  Interventions:  SW will send the FNS application and link  Patient Goals/Self-Care Activities:  Follow up with FNS application  for community food options.  Plan:   Telephone follow up appointment with care management team member scheduled for:  09-17-2024 at 10:00 am        Recommendation:   call and/or follow up with DSS Case worker that called you and needing som documents  for food assistance  Follow Up Plan:   Telephone follow up appointment date/time:  09/17/2024 at 10:00 am  Tobias CHARM Maranda HEDWIG, PhD [facility], [facility] Social Worker Direct Dial: [phone]  Fax: [phone]

## 2024-08-31 NOTE — Patient Instructions (Signed)
 Visit Information  Thank you for taking time to visit with me today. Please don't hesitate to contact me if I can be of assistance to you before our next scheduled appointment.  Your next care management appointment is by telephone on 09/17/2024 at 10:00 am   Please call the care guide team at [phone] if you need to cancel, schedule, or reschedule an appointment.   Please call the Suicide and Crisis Lifeline: 988 go to [facility] [address] [place] [phone]) call 911 if you are experiencing a Mental Health or Behavioral Health Crisis or need someone to talk to.  Tobias CHARM Maranda HEDWIG, PhD [facility], [facility] Social Worker Direct Dial: [phone]  Fax: [phone]

## 2024-09-03 ENCOUNTER — Encounter: Payer: Self-pay | Admitting: Radiology

## 2024-09-04 ENCOUNTER — Telehealth (HOSPITAL_BASED_OUTPATIENT_CLINIC_OR_DEPARTMENT_OTHER): Payer: Self-pay | Admitting: *Deleted

## 2024-09-04 NOTE — Assessment & Plan Note (Addendum)
 Suspected Hashimoto's with levothyroxine  stopped, leading to decreased anxiety but increased fatigue. Concerns about thyroid  fluctuations and potential autoimmune involvement despite negative ANA. - Order thyroid  function tests. - Hold levothyroxine  until test results are available. - Discuss with pharmacy about switching to brand Synthroid  if generic inconsistency is suspected.

## 2024-09-04 NOTE — Assessment & Plan Note (Addendum)
 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

## 2024-09-04 NOTE — Assessment & Plan Note (Addendum)
 Managed with Repatha . - Continue Repatha .

## 2024-09-04 NOTE — Assessment & Plan Note (Addendum)
 Chronic pain syndrome with fibromyalgia, exacerbated by recent shoulder injury. Pain management includes Dilaudid , ibuprofen , and tizanidine . Cautious about opioid use due to past addiction and constipation. - Prescribed methylprednisolone  for inflammation and pain management. - Advised on use of Dulcolax for opioid-induced constipation. - Encouraged continued use of ibuprofen  with caution due to stomach issues. - Discussed potential for Toradol  or other pain management options if needed. Orders:   methylPREDNISolone  (MEDROL ) 4 MG tablet; Take 1 tablet (4 mg total) by mouth daily.
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
## 2024-09-04 NOTE — Assessment & Plan Note (Addendum)
 Continue to monitor for new or changing symptoms Will adjust treatment based on labs today

## 2024-09-04 NOTE — Assessment & Plan Note (Addendum)
 Chronic pain syndrome with fibromyalgia, exacerbated by recent shoulder injury. Pain management includes Dilaudid , ibuprofen , and tizanidine . Cautious about opioid use due to past addiction and constipation. - Prescribed methylprednisolone  for inflammation and pain management. - Advised on use of Dulcolax for opioid-induced constipation. - Encouraged continued use of ibuprofen  with caution due to stomach issues. - Discussed potential for Toradol  or other pain management options if needed.

## 2024-09-04 NOTE — Assessment & Plan Note (Addendum)
 Blood pressure management ongoing. Reports sensitivity to timing of medication intake. - Continue current antihypertensive regimen.

## 2024-09-04 NOTE — Assessment & Plan Note (Addendum)
 Type 2 diabetes managed with Ozempic . Current dose is 0.5 mg, plan to increase to 1 mg. - Increased Ozempic  dose to 1 mg. - Sent prescription to [facility] on [address]. Orders:   Semaglutide , 1 MG/DOSE, (OZEMPIC , 1 MG/DOSE,) 4 MG/3ML SOPN; Inject 1 mg into the skin once a week.

## 2024-09-04 NOTE — Telephone Encounter (Signed)
-----   Message from Reche GORMAN Finder sent at 09/04/2024  4:56 PM EST ----- Hi Macario,  STOPBang 5, chart updated.  Help me understand - if they have previously diagnosed OSA, which she does, does it still require a STOP Bang each time even though we've established she has OSA?  Thanks,  Reche GORMAN Finder, NP ----- Message ----- From: Gladis Porter HERO, LPN Sent: 88/03/7973   4:54 PM EST To: Reche GORMAN Finder, NP   ----- Message ----- From: Laurent Avelina HERO, LPN Sent: 88/03/7973   4:39 PM EST To: Porter HERO Gladis, LPN  Hi Cozy Veale, If you are not Caitlin's covering please forward this message to them.  Caitlin ordered a Itamar at the pts 10/23 OV but the Stop Wynona questionnaire was not completed. Please answer the Stop Wynona questions and then let me know when it is done so I can do the PA. Thank you, Macario

## 2024-09-05 ENCOUNTER — Encounter: Payer: Self-pay | Admitting: Physician Assistant

## 2024-09-05 ENCOUNTER — Ambulatory Visit: Admitting: Physician Assistant

## 2024-09-05 ENCOUNTER — Ambulatory Visit: Admitting: Endocrinology

## 2024-09-05 VITALS — BP 142/92 | HR 72 | Temp 97.5°F | Ht 65.0 in | Wt 182.6 lb

## 2024-09-05 DIAGNOSIS — E782 Mixed hyperlipidemia: Secondary | ICD-10-CM

## 2024-09-05 DIAGNOSIS — E538 Deficiency of other specified B group vitamins: Secondary | ICD-10-CM

## 2024-09-05 DIAGNOSIS — I10 Essential (primary) hypertension: Secondary | ICD-10-CM | POA: Diagnosis not present

## 2024-09-05 DIAGNOSIS — S43431A Superior glenoid labrum lesion of right shoulder, initial encounter: Secondary | ICD-10-CM

## 2024-09-05 DIAGNOSIS — D8940 Mast cell activation, unspecified: Secondary | ICD-10-CM

## 2024-09-05 DIAGNOSIS — E1165 Type 2 diabetes mellitus with hyperglycemia: Secondary | ICD-10-CM

## 2024-09-05 DIAGNOSIS — M797 Fibromyalgia: Secondary | ICD-10-CM

## 2024-09-05 DIAGNOSIS — K117 Disturbances of salivary secretion: Secondary | ICD-10-CM

## 2024-09-05 DIAGNOSIS — M25512 Pain in left shoulder: Secondary | ICD-10-CM

## 2024-09-05 DIAGNOSIS — E039 Hypothyroidism, unspecified: Secondary | ICD-10-CM

## 2024-09-05 DIAGNOSIS — J0110 Acute frontal sinusitis, unspecified: Secondary | ICD-10-CM

## 2024-09-05 DIAGNOSIS — M159 Polyosteoarthritis, unspecified: Secondary | ICD-10-CM

## 2024-09-05 DIAGNOSIS — G894 Chronic pain syndrome: Secondary | ICD-10-CM

## 2024-09-05 LAB — LUPUS (SLE) ANALYSIS
Anti Nuclear Antibody (ANA): NEGATIVE
Anti-striation Abs: NEGATIVE
Complement C4, Serum: 36 mg/dL (ref 12–38)
ENA RNP Ab: <0.2 AI (ref 0.0–0.9)
ENA SM Ab Ser-aCnc: <0.2 AI (ref 0.0–0.9)
ENA SSA (RO) Ab: <0.2 AI (ref 0.0–0.9)
ENA SSB (LA) Ab: <0.2 AI (ref 0.0–0.9)
Mitochondrial Ab: 47.7 U — ABNORMAL HIGH (ref 0.0–20.0)
Parietal Cell Ab: 1.1 U (ref 0.0–20.0)
Scleroderma (Scl-70) (ENA) Antibody, IgG: <0.2 AI (ref 0.0–0.9)
Smooth Muscle Ab: 16 U (ref 0–19)
Thyroperoxidase Ab SerPl-aCnc: 312 [IU]/mL — ABNORMAL HIGH (ref 0–34)
dsDNA Ab: <1 [IU]/mL (ref 0–9)

## 2024-09-05 LAB — SPECIMEN STATUS REPORT

## 2024-09-05 MED ORDER — METHYLPREDNISOLONE 4 MG PO TABS: 4.0000 mg | ORAL_TABLET | Freq: Every day | ORAL | 1 refills | Status: DC

## 2024-09-05 MED ORDER — FLUCONAZOLE 150 MG PO TABS: 150.0000 mg | ORAL_TABLET | Freq: Once | ORAL | 0 refills | Status: AC

## 2024-09-05 MED ORDER — OZEMPIC (1 MG/DOSE) 4 MG/3ML ~~LOC~~ SOPN: 1.0000 mg | PEN_INJECTOR | SUBCUTANEOUS | 2 refills | Status: AC

## 2024-09-05 MED ORDER — LEVOFLOXACIN 500 MG PO TABS: 500.0000 mg | ORAL_TABLET | Freq: Every day | ORAL | 0 refills | Status: DC

## 2024-09-05 NOTE — Assessment & Plan Note (Addendum)
 Disturbances of salivary secretion (dry mouth, possible medication or autoimmune) Dry mouth possibly related to medication or autoimmune condition. Sjogren's syndrome considered. - Ordered Sjogren's syndrome workup. Orders:   Sjogren's syndrome antibods(ssa + ssb)

## 2024-09-05 NOTE — Assessment & Plan Note (Addendum)
 Recent treatment with Levaquin. Symptoms improved but recur after stopping antibiotics. - Prescribed Diflucan  for potential yeast infection. - Advised on use of probiotics or yogurt to mitigate antibiotic side effects. Orders:   fluconazole  (DIFLUCAN ) 150 MG tablet; Take 1 tablet (150 mg total) by mouth once for 1 dose.

## 2024-09-06 ENCOUNTER — Encounter: Payer: Self-pay | Admitting: Physician Assistant

## 2024-09-06 LAB — COMPREHENSIVE METABOLIC PANEL WITH GFR
ALT: 14 IU/L (ref 0–32)
AST: 19 IU/L (ref 0–40)
Albumin: 4.4 g/dL (ref 3.8–4.9)
Alkaline Phosphatase: 77 IU/L (ref 49–135)
BUN/Creatinine Ratio: 23 (ref 9–23)
BUN: 19 mg/dL (ref 6–24)
Bilirubin Total: 0.3 mg/dL (ref 0.0–1.2)
CO2: 30 mmol/L — ABNORMAL HIGH (ref 20–29)
Calcium: 10.5 mg/dL — ABNORMAL HIGH (ref 8.7–10.2)
Chloride: 93 mmol/L — ABNORMAL LOW (ref 96–106)
Creatinine, Ser: 0.81 mg/dL (ref 0.57–1.00)
Globulin, Total: 2.8 g/dL (ref 1.5–4.5)
Glucose: 102 mg/dL — ABNORMAL HIGH (ref 70–99)
Potassium: 3.3 mmol/L — ABNORMAL LOW (ref 3.5–5.2)
Sodium: 141 mmol/L (ref 134–144)
Total Protein: 7.2 g/dL (ref 6.0–8.5)
eGFR: 84 mL/min/1.73 (ref >59)

## 2024-09-06 LAB — THYROID PANEL WITH TSH
Free Thyroxine Index: 2.9 (ref 1.2–4.9)
T3 Uptake Ratio: 27 % (ref 24–39)
T4, Total: 10.9 ug/dL (ref 4.5–12.0)
TSH: 5.6 u[IU]/mL — ABNORMAL HIGH (ref 0.450–4.500)

## 2024-09-06 LAB — PARATHYROID HORMONE, INTACT (NO CA): PTH: 39 pg/mL (ref 15–65)

## 2024-09-07 ENCOUNTER — Other Ambulatory Visit: Payer: Self-pay | Admitting: Physician Assistant

## 2024-09-07 ENCOUNTER — Encounter: Payer: Self-pay | Admitting: Orthopedic Surgery

## 2024-09-07 ENCOUNTER — Encounter

## 2024-09-07 ENCOUNTER — Other Ambulatory Visit: Payer: Self-pay

## 2024-09-07 DIAGNOSIS — R29898 Other symptoms and signs involving the musculoskeletal system: Secondary | ICD-10-CM

## 2024-09-07 DIAGNOSIS — M25511 Pain in right shoulder: Secondary | ICD-10-CM

## 2024-09-07 DIAGNOSIS — J0110 Acute frontal sinusitis, unspecified: Secondary | ICD-10-CM

## 2024-09-07 LAB — SJOGREN'S SYNDROME ANTIBODS(SSA + SSB)
ENA SSA (RO) Ab: <0.2 AI (ref 0.0–0.9)
ENA SSB (LA) Ab: <0.2 AI (ref 0.0–0.9)

## 2024-09-07 LAB — LUPUS (SLE) ANALYSIS
Anti Nuclear Antibody (ANA): NEGATIVE
Anti-striation Abs: NEGATIVE
Complement C4, Serum: 31 mg/dL (ref 12–38)
ENA RNP Ab: <0.2 AI (ref 0.0–0.9)
ENA SM Ab Ser-aCnc: <0.2 AI (ref 0.0–0.9)
ENA SSA (RO) Ab: <0.2 AI (ref 0.0–0.9)
ENA SSB (LA) Ab: <0.2 AI (ref 0.0–0.9)
Mitochondrial Ab: 28.8 U — ABNORMAL HIGH (ref 0.0–20.0)
Parietal Cell Ab: 1.1 U (ref 0.0–20.0)
Scleroderma (Scl-70) (ENA) Antibody, IgG: <0.2 AI (ref 0.0–0.9)
Smooth Muscle Ab: 8 U (ref 0–19)
Thyroperoxidase Ab SerPl-aCnc: 383 [IU]/mL — ABNORMAL HIGH (ref 0–34)
dsDNA Ab: <1 [IU]/mL (ref 0–9)

## 2024-09-07 MED ORDER — LEVOFLOXACIN 500 MG PO TABS: 500.0000 mg | ORAL_TABLET | Freq: Every day | ORAL | 0 refills | Status: AC

## 2024-09-08 ENCOUNTER — Encounter: Payer: Self-pay | Admitting: Physician Assistant

## 2024-09-09 NOTE — Telephone Encounter (Signed)
 Mri arthro both thx

## 2024-09-10 ENCOUNTER — Other Ambulatory Visit: Payer: Self-pay

## 2024-09-10 DIAGNOSIS — M25512 Pain in left shoulder: Secondary | ICD-10-CM

## 2024-09-10 MED ORDER — LEVOTHYROXINE SODIUM 88 MCG PO TABS: 88.0000 ug | ORAL_TABLET | Freq: Every day | ORAL | 0 refills | Status: AC

## 2024-09-11 ENCOUNTER — Other Ambulatory Visit: Payer: Self-pay | Admitting: Family Medicine

## 2024-09-12 ENCOUNTER — Other Ambulatory Visit (INDEPENDENT_AMBULATORY_CARE_PROVIDER_SITE_OTHER): Payer: Self-pay

## 2024-09-12 ENCOUNTER — Other Ambulatory Visit: Payer: Self-pay

## 2024-09-12 ENCOUNTER — Telehealth: Payer: Self-pay

## 2024-09-12 ENCOUNTER — Ambulatory Visit: Admitting: Orthopedic Surgery

## 2024-09-12 ENCOUNTER — Encounter: Payer: Self-pay | Admitting: Physician Assistant

## 2024-09-12 DIAGNOSIS — M25512 Pain in left shoulder: Secondary | ICD-10-CM | POA: Diagnosis not present

## 2024-09-12 DIAGNOSIS — M65912 Unspecified synovitis and tenosynovitis, left shoulder: Secondary | ICD-10-CM | POA: Diagnosis not present

## 2024-09-12 DIAGNOSIS — M25511 Pain in right shoulder: Secondary | ICD-10-CM

## 2024-09-12 MED ORDER — METHYLPREDNISOLONE 4 MG PO TBPK: ORAL_TABLET | ORAL | 0 refills | Status: DC

## 2024-09-12 MED ORDER — REPATHA SURECLICK 140 MG/ML ~~LOC~~ SOAJ: 140.0000 mg | SUBCUTANEOUS | 0 refills | Status: DC

## 2024-09-12 NOTE — Telephone Encounter (Signed)
 Patient scheduled for bilateral shoulder arthrograms on 10/02/24.  However patient is scheduled for TKA in December as well.  Is there anyway possible we can get the scans done sooner at a different [facility] facility? Patient said she can drive as she is not currently working.

## 2024-09-12 NOTE — Telephone Encounter (Signed)
 Rx pended

## 2024-09-12 NOTE — Patient Instructions (Signed)
 Visit Information  Thank you for taking time to visit with me today. Please don't hesitate to contact me if I can be of assistance to you before our next scheduled appointment.  Your next care management appointment is by telephone on 10/11/24 at 10:00 am  Please call the care guide team at [phone] if you need to cancel, schedule, or reschedule an appointment.   Please call the Suicide and Crisis Lifeline: 988 call the [place]  National Suicide Prevention Lifeline: 548-269-2311 or TTY: 780-469-7551 TTY [phone]) to talk to a trained counselor if you are experiencing a Mental Health or Behavioral Health Crisis or need someone to talk to.  Heddy Shutter, RN, MSN, BSN, CCM [place]  [facility], Population Health Case Manager Phone: [phone]

## 2024-09-12 NOTE — Patient Outreach (Signed)
 Complex Care Management   Visit Note  09/12/2024  Name:  Laura [name] MRN: [id number] DOB: [birth year]  Situation: Referral received for Complex Care Management related to HTN I obtained verbal consent from Patient.  Visit completed with Patient  on the phone  Background:   Past Medical History:  Diagnosis Date   Acute GI bleeding 09/01/2019   Anxiety and depression    Asthma    Barrett's esophagus 11/02/2016   Dr. Avram, GI   Bowel obstruction [facility])    Chest pain    a. 01/2016 Ex MV: Hypertensive response. Freq PVCs w/ exercise. nl EF. No ST/T changes. No ischemia.   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 03/2016 Echo: EF 60-65%, no rwma, mild MR, nl LA size, nl RV fxn.   Herpes zoster without complication 10/11/2021   High cholesterol    History of hiatal hernia    Hypertension    Kidney stones    Myofascial pain syndrome    Nasal septal perforation 05/12/2018   Hx of cocaine use   Palpitations    a. 03/2016 Holter: Sinus rhythm, avg HR 83, max 123, min 64. 4 PACs. 10,356 isolated PVCs, one vent couplet, 3842 V bigeminy, 4 beats NSVT->prev on BB - dc 2/2 swelling.   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 Took metformin to try to lessen   Raynaud disease    Rectocele    Shingles    Sleep apnea    mild per pt   Status post hysterectomy 03/08/2017   Torn rotator cuff    left   Urinary retention with incomplete bladder emptying    Vaginal dryness, menopausal    Vaginal enterocele    Vitamin B12 deficiency 07/26/2016   Vitamin D  deficiency 12/03/2014   Wears hearing aid in both ears     Assessment: Patient Reported Symptoms:  Cognitive Cognitive Status: No symptoms reported      Neurological Neurological Review of Symptoms: Other: Oher Neurological Symptoms/Conditions  [RPT]: history of fibromyalgia- tingling, pins and needles pain    HEENT HEENT Symptoms Reported: No symptoms reported      Cardiovascular Cardiovascular Symptoms Reported: No symptoms reported    Respiratory Respiratory Symptoms Reported: No symptoms reported    Endocrine Endocrine Symptoms Reported: No symptoms reported Is patient diabetic?: Yes Is patient checking blood sugars at home?: Yes    Gastrointestinal Gastrointestinal Symptoms Reported: No symptoms reported      Genitourinary Genitourinary Symptoms Reported: No symptoms reported    Integumentary Integumentary Symptoms Reported: No symptoms reported    Musculoskeletal Musculoskelatal Symptoms Reviewed: Joint pain, Difficulty walking, Limited mobility, Muscle pain        Psychosocial Psychosocial Symptoms Reported: No symptoms reported Additional Psychological Details: patient continues to follow up with therapist as scheduled/recommended.          Vitals:   09/12/24 1058  BP: 138/84  Pulse: 83   Pain Scale: 0-10 Pain Score: 8  Pain Type: Chronic pain Pain Location:  (knee, both shoulder and generalized pain)  Medications Reviewed Today     Reviewed by Loneta Tamplin M, RN (Registered Nurse) on 09/12/24 at 1110  Med List Status: <None>   Medication Order Taking? Sig Documenting Provider Last Dose Status Informant  albuterol  (VENTOLIN  HFA) 108 (90 Base) MCG/ACT inhaler [id number]  Inhale 2 puffs into the lungs every 4 (four) hours as needed for wheezing or shortness of breath. Tapia, Leisa, PA-C  Active Self  ALPRAZolam  (XANAX ) 1 MG tablet [id number]  Take 1 tablet (1 mg total) by mouth 3 (three) times daily. Milon Cleaves, [name]  Active Self  bisacodyl 5 MG EC tablet [id number] Yes Take 5 mg by mouth daily as needed for moderate constipation. [provider]  Active   budeson-glycopyrrolate -formoterol  (BREZTRI  AEROSPHERE) 160-9-4.8 MCG/ACT AERO inhaler [id number]  Inhale 2 puffs into the lungs 2 (two) times  daily. Milon Cleaves, PA  Active Self  carvedilol  (COREG ) 25 MG tablet [id number]  Take 1 tablet (25 mg total) by mouth 2 (two) times daily with a meal. Walker, Caitlin S, NP  Active   chlorthalidone  (HYGROTON ) 25 MG tablet [id number]  TAKE ONE TABLET BY MOUTH EVERY DAY Milon Cleaves, [name]  Active   EPINEPHrine  0.3 mg/0.3 mL IJ SOAJ injection [id number]  Inject 0.3 mLs (0.3 mg total) into the muscle as needed for anaphylaxis. Abran Jerilynn Loving, MD  Active Self  Evolocumab  (REPATHA  SURECLICK) 140 MG/ML [name] [id number]  Inject 140 mg into the skin every 14 (fourteen) days. Sirivol, Mamatha, MD  Active   furosemide  (LASIX ) 20 MG tablet [id number]  TAKE ONE TABLET BY MOUTH TWICE DAILY Craft, Cleaves, [name]  Active   glucose blood (ACCU-CHEK GUIDE) test strip [id number]  1 each by Other route daily in the afternoon. Use as instructed Abran Jerilynn Loving, MD  Active   HYDROmorphone  (DILAUDID ) 2 MG tablet [id number] Yes Take 1 tablet (2 mg total) by mouth 3 (three) times daily as needed for moderate pain (pain score 4-6) (pains score 4-6). Lovorn, Megan, MD  Active Self           Med Note ANICE, ROBBIN M   Mon Aug 20, 2024  2:49 PM) Last taken 2-3 days ago Last filled 05/23/2024 for 75   On hand #20  ipratropium-albuterol  (DUONEB) 0.5-2.5 (3) MG/3ML SOLN [id number]  Take 3 mLs by nebulization every 4 (four) hours as needed. Charlene Clotilda PARAS, NP  Active Self  levofloxacin (LEVAQUIN) 500 MG tablet [id number]  Take 1 tablet (500 mg total) by mouth daily for 7 days. Milon Cleaves, PA  Active   levothyroxine  (SYNTHROID ) 88 MCG tablet [id number]  Take 1 tablet (88 mcg total) by mouth daily. Sirivol, Mamatha, MD  Active   methylPREDNISolone  (MEDROL ) 4 MG tablet [id number]  Take 1 tablet (4 mg total) by mouth daily. Milon Cleaves, [name]  Active   nystatin  powder [id number]  Apply 1 Application topically 2 (two) times daily as needed (for skin irritation- affected areas). [provider]  Active Self  ondansetron   (ZOFRAN -ODT) 4 MG disintegrating tablet [id number]  Take 1 tablet (4 mg total) by mouth every 8 (eight) hours as needed for nausea or vomiting. Milon Cleaves, PA  Active Self  potassium chloride  SA (KLOR-CON  M) 20 MEQ tablet [id number]  Take 3 tablets in the morning, 2 tablets in the afternoon, 3 tablets at night. Milon Cleaves, PA  Active   prazosin  (MINIPRESS ) 2 MG capsule [id number]  Take 1 capsule (2 mg total) by mouth 2 (two) times daily. Vannie Reche RAMAN, NP  Active   promethazine  (PHENERGAN ) 25 MG tablet [id number]  Take 1 tablet (25 mg total) by mouth every 6 (six) hours as needed for nausea or vomiting. Mannie Fairy DASEN, DO  Active Self  Ruxolitinib  Phosphate (OPZELURA ) 1.5 %  CREA [id number]  Apply sparingly twice daily if needed Milon Cleaves, [name]  Active Self  Semaglutide , 1 MG/DOSE, (OZEMPIC , 1 MG/DOSE,) 4 MG/3ML SOPN [id number]  Inject 1 mg into the skin once a week. Milon Cleaves, PA  Active   tiZANidine  (ZANAFLEX ) 2 MG tablet [id number]  Take 1-2 tablets (2-4 mg total) by mouth every 6 (six) hours as needed for muscle spasms. Lovorn, Megan, MD  Active   Ubrogepant  (UBRELVY ) 100 MG TABS [id number]  Take 1 tablet (100 mg total) by mouth 2 (two) times daily as needed (Take one and if symptoms persist after 2 hours take a second one. Do not take more than 2 in 24 hours). Milon Cleaves, PA  Active   Vitamin D , Ergocalciferol , (DRISDOL ) 1.25 MG (50000 UNIT) CAPS capsule [id number]  Take 1 capsule (50,000 Units total) by mouth every 7 (seven) days. Milon Cleaves, PA  Active Self  Vonoprazan Fumarate  (VOQUEZNA ) 10 MG TABS [id number]  Take 10 mg by mouth daily. Milon Cleaves, PA  Active Self  zolpidem  (AMBIEN ) 10 MG tablet [id number]  Take 1 tablet (10 mg total) by mouth at bedtime. Milon Cleaves, PA  Active           Recommendation:   Continue Current Plan of Care  Follow Up Plan:   Telephone follow up appointment date/time:  10/11/24 at 10:00 am  Heddy Shutter, RN, MSN, BSN, CCM [place]   [facility], Population Health Case Manager Phone: [phone]
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
## 2024-09-13 NOTE — Telephone Encounter (Signed)
 Waiting to hear back from [facility] with [facility] to see if they sooner availability.

## 2024-09-14 ENCOUNTER — Other Ambulatory Visit: Payer: Self-pay | Admitting: Physical Medicine and Rehabilitation

## 2024-09-14 ENCOUNTER — Encounter: Payer: Self-pay | Admitting: Orthopedic Surgery

## 2024-09-14 ENCOUNTER — Other Ambulatory Visit: Payer: Self-pay

## 2024-09-14 MED ORDER — LIDOCAINE HCL 1 % IJ SOLN
5.0000 mL | INTRAMUSCULAR | Status: AC | PRN
  Administered 2024-09-12: 5 mL

## 2024-09-14 MED ORDER — REPATHA SURECLICK 140 MG/ML ~~LOC~~ SOAJ: 140.0000 mg | SUBCUTANEOUS | 0 refills | Status: DC

## 2024-09-14 MED ORDER — METHYLPREDNISOLONE ACETATE 40 MG/ML IJ SUSP
40.0000 mg | INTRAMUSCULAR | Status: AC | PRN
  Administered 2024-09-12: 40 mg via INTRA_ARTICULAR

## 2024-09-14 MED ORDER — BUPIVACAINE HCL 0.5 % IJ SOLN
9.0000 mL | INTRAMUSCULAR | Status: AC | PRN
  Administered 2024-09-12: 9 mL via INTRA_ARTICULAR

## 2024-09-16 NOTE — Assessment & Plan Note (Signed)
 Generalized joint pain with ongoing workup for inflammatory arthritis. Previous tests for lupus and rheumatoid arthritis negative. Scleroderma workup pending. Elevated calcium  levels noted. - Ordered scleroderma workup. - Checked calcium  and parathyroid  levels. - Continue monitoring for signs of inflammatory arthritis.

## 2024-09-16 NOTE — Assessment & Plan Note (Signed)
 Severe right shoulder pain with suspected tendon and labral injury. Previous shoulder surgery with extensive tears. MRI with contrast needed. Fear of joint injections noted. - Ordered MRI with contrast for right shoulder. - Referred to orthopedist for evaluation and potential surgical intervention. - Advised use of shoulder sling when out and about. - Encouraged passive range of motion exercises at home. - Discussed potential for reverse total shoulder replacement if extensive damage is confirmed.

## 2024-09-16 NOTE — Assessment & Plan Note (Signed)
 Left shoulder pain with possible rotator cuff strain. Pain localized to the front of the shoulder. - Advised use of shoulder sling when out and about. - Encouraged passive range of motion exercises at home.

## 2024-09-17 ENCOUNTER — Other Ambulatory Visit: Payer: Self-pay | Admitting: Licensed Clinical Social Worker

## 2024-09-17 NOTE — Patient Outreach (Signed)
 Social Drivers of Health  Community Resource and Care Coordination Visit Note   09/17/2024  Name: Laura [name] MRN: [id number] DOB:[birth year]  Situation: Referral received for [facility] needs assessment and assistance related to Food Insecurity . I obtained verbal consent from Patient.  Visit completed with Patient on the phone.   Background:      Assessment:   Goals Addressed             This Visit's Progress    COMPLETED: BSW VBCI Social Work Care Plan       Problems:   Food Insecurity   CSW Clinical Goal(s):   Over the next 2 weeks the Patient will will follow up with FNS application as directed by Social Work.  Interventions:  SW will send the FNS application and link  Patient Goals/Self-Care Activities:  Follow up with FNS application  for community food options.  Plan:   Telephone follow up appointment with care management team member scheduled for:  09-17-2024 at 10:00 am        Recommendation:   Case closure today all goals met  Follow Up Plan:   Patient has achieved all patient stated goals. [employer] will be closed. Patient has been provided contact information should new needs arise.   Tobias CHARM Maranda HEDWIG, PhD [facility] Social Worker Direct Dial: [phone]  Fax: [phone]

## 2024-09-17 NOTE — Patient Instructions (Signed)

## 2024-09-19 ENCOUNTER — Ambulatory Visit: Admitting: Orthopedic Surgery

## 2024-09-19 ENCOUNTER — Telehealth: Payer: Self-pay

## 2024-09-19 MED ORDER — REPATHA SURECLICK 140 MG/ML ~~LOC~~ SOAJ
140.0000 mg | SUBCUTANEOUS | 2 refills | Status: DC
  Filled 2024-09-19: qty 2, 28d supply, fill #0

## 2024-09-19 NOTE — Progress Notes (Signed)
   09/19/2024  Patient ID: Laura [name], female   DOB: [birth year], 58 y.o.   MRN: [id number]  Incoming call received from patient regarding problem with Repatha  processing at [facility].  Spoke with [facility] - held up d/t clarification needed on Rx/allergy  information. I provided patient information and pharmacist stated he would send over to different dept to have processed. States they will process for prefilled syringes 84 days supply.   Patient also states that her healthwell grant has expired and will need updated come next year, especially while in deductible phase.     [address] - [place], [place] - [address] [place] [address] Phone: [phone] Fax: [phone]  [facility] - [place], [place] - [address] [place]  [address] Phone: [phone] Fax: [phone]  [facility] [phone] - [place], [place] - [address] AT [facility] [address] [place] [address] Phone: [phone] Fax: [phone]  Lang Sieve, PharmD, BCGP Clinical Pharmacist  [phone]

## 2024-09-19 NOTE — Progress Notes (Signed)
 Spoke with [employer]: obtained approval for HLD grant $2500. Patient aware. Moving forward would like to have [facility] mail order process. Ok with using [facility] for this fill.    [employer] Rx Card Information-Secondary Coverage: ID: [id number] Group: [id number] PCN: [id number] BIN: [id number]  Activation Date: 09/19/2024 Expiration Date: 08/19/2025  [employer]well ID: [id number] Last 4 SSN: [id number]

## 2024-09-20 ENCOUNTER — Other Ambulatory Visit (HOSPITAL_COMMUNITY): Payer: Self-pay

## 2024-09-20 ENCOUNTER — Other Ambulatory Visit: Payer: Self-pay

## 2024-09-20 ENCOUNTER — Telehealth: Payer: Self-pay

## 2024-09-20 NOTE — Progress Notes (Signed)
   09/20/2024  Patient ID: [name], female   DOB: 02-14-1966, 58 y.o.   MRN: [id number]  Received call from patient regarding repatha  order - [facility] told her that they cancelled rx and were needing more information.   I contacted [facility], and while doing so I noticed [facility] was in the process of sending rx to patient, which she was completely ok with. No copay at this time given catastrophic coverage phase.   Patient also aware that she is approved for healthwell grant. I will send her the program and rx card information separately. Will also send ups tracking info.   Lang Sieve, PharmD, BCGP Clinical Pharmacist  [phone]

## 2024-09-21 ENCOUNTER — Other Ambulatory Visit (HOSPITAL_COMMUNITY): Payer: Self-pay

## 2024-09-21 ENCOUNTER — Telehealth: Payer: Self-pay | Admitting: Physical Medicine and Rehabilitation

## 2024-09-21 NOTE — Telephone Encounter (Signed)
 P needs refill of HYDROmorphone  (DILAUDID ) 2 MG tablet

## 2024-09-25 ENCOUNTER — Other Ambulatory Visit
Admission: RE | Admit: 2024-09-25 | Discharge: 2024-09-25 | Disposition: A | Discharge status: Home / Self Care | Attending: Physician Assistant | Admitting: Physician Assistant

## 2024-09-25 ENCOUNTER — Ambulatory Visit: Payer: Self-pay | Admitting: Physician Assistant

## 2024-09-25 ENCOUNTER — Ambulatory Visit: Attending: Physician Assistant | Admitting: Physician Assistant

## 2024-09-25 ENCOUNTER — Encounter: Payer: Self-pay | Admitting: Physician Assistant

## 2024-09-25 VITALS — BP 120/78 | HR 85 | Ht 65.0 in | Wt 185.8 lb

## 2024-09-25 DIAGNOSIS — Z789 Other specified health status: Secondary | ICD-10-CM | POA: Diagnosis not present

## 2024-09-25 DIAGNOSIS — E7849 Other hyperlipidemia: Secondary | ICD-10-CM

## 2024-09-25 DIAGNOSIS — I1A0 Resistant hypertension: Secondary | ICD-10-CM

## 2024-09-25 DIAGNOSIS — Z79899 Other long term (current) drug therapy: Secondary | ICD-10-CM | POA: Insufficient documentation

## 2024-09-25 DIAGNOSIS — G4730 Sleep apnea, unspecified: Secondary | ICD-10-CM

## 2024-09-25 DIAGNOSIS — I493 Ventricular premature depolarization: Secondary | ICD-10-CM | POA: Diagnosis not present

## 2024-09-25 LAB — BASIC METABOLIC PANEL WITH GFR
Anion gap: 9 (ref 5–15)
BUN: 14 mg/dL (ref 6–20)
CO2: 32 mmol/L (ref 22–32)
Calcium: 9.9 mg/dL (ref 8.9–10.3)
Chloride: 98 mmol/L (ref 98–111)
Creatinine, Ser: 0.6 mg/dL (ref 0.44–1.00)
GFR, Estimated: >60 mL/min (ref >60)
Glucose, Bld: 96 mg/dL (ref 70–99)
Potassium: 4.2 mmol/L (ref 3.5–5.1)
Sodium: 138 mmol/L (ref 135–145)

## 2024-09-25 MED ORDER — CARVEDILOL 12.5 MG PO TABS: ORAL_TABLET | ORAL | 11 refills | Status: DC

## 2024-09-25 MED ORDER — POTASSIUM CHLORIDE 20 MEQ PO PACK: 20.0000 meq | PACK | Freq: Every day | ORAL | 3 refills | Status: DC

## 2024-09-25 MED ORDER — CARVEDILOL 12.5 MG PO TABS: ORAL_TABLET | ORAL | 3 refills | Status: DC

## 2024-09-25 NOTE — Patient Instructions (Addendum)
 Medication Instructions:  Your physician recommends the following medication changes.  INCREASE: Coreg  (carvedilol ) 6.25 mg at noon in addition to the 25 mg morning and evening doses.  *If you need a refill on your cardiac medications before your next appointment, please call your pharmacy*  Lab Work: Your provider would like for you to have following labs drawn today [facility] in [facility].   If you have labs (blood work) drawn today and your tests are completely normal, you will receive your results only by: MyChart Message (if you have MyChart) OR A paper copy in the mail If you have any lab test that is abnormal or we need to change your treatment, we will call you to review the results.  Follow-Up: At [facility], you and your health needs are our priority.  As part of our continuing mission to provide you with exceptional heart care, our providers are all part of one team.  This team includes your primary Cardiologist (physician) and Advanced Practice Providers or APPs (Physician Assistants and Nurse Practitioners) who all work together to provide you with the care you need, when you need it.  Your next appointment:   2 month(s)  Provider:   You may see Deatrice Cage, MD or [name], PA-C  Other Instructions We'll be contacting  you later today with guidance on the potassium levels

## 2024-09-25 NOTE — Progress Notes (Signed)
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   CHOL 228 (H) 07/24/2024 0938   TRIG 225 (H) 07/24/2024 0938   HDL 62 07/24/2024 0938   CHOLHDL 3.7 07/24/2024 0938   CHOLHDL 6.1 (H) 06/20/2019 0908   VLDL NOT CALC 05/13/2017 0838   LDLCALC 127 (H) 07/24/2024 0938   LDLCALC 196 (H) 06/20/2019 0908   LDLDIRECT 222 (H) 04/26/2019 1338    PHYSICAL EXAM:    VS:  BP 120/78 (BP Location: Left Arm, Patient Position: Sitting, Cuff Size: Normal)   Pulse 85   Ht 5' 5 (1.651 m)   Wt 185 lb 12.8 oz (84.3 kg)   SpO2 98%   BMI 30.92 kg/m   BMI: Body mass index is 30.92 kg/m.  Physical Exam Vitals reviewed.  Constitutional:      Appearance: She is well-developed.  HENT:     Head: Normocephalic and atraumatic.  Eyes:     General:        Right eye: No discharge.        Left eye: No discharge.  Cardiovascular:     Rate and Rhythm: Normal rate and regular rhythm.     Heart sounds: S1 normal and S2 normal. Heart sounds not distant. No midsystolic click and no opening snap. Murmur heard.     Diastolic murmur is present with a grade of 1/4 at the upper right sternal border.     No friction rub.  Pulmonary:      Effort: Pulmonary effort is normal. No respiratory distress.     Breath sounds: Normal breath sounds. No decreased breath sounds, wheezing, rhonchi or rales.  Musculoskeletal:     Cervical back: Normal range of motion.     Comments: Trivial lower extremity swelling.  Skin:    General: Skin is warm and dry.     Nails: There is no clubbing.  Neurological:     Mental Status: She is alert and oriented to person, place, and time.  Psychiatric:        Speech: Speech normal.        Behavior: Behavior normal.        Thought Content: Thought content normal.        Judgment: Judgment normal.     Wt Readings from Last 3 Encounters:  09/25/24 185 lb 12.8 oz (84.3 kg)  09/05/24 182 lb 9.6 oz (82.8 kg)  08/23/24 185 lb 1.6 oz (84 kg)     ASSESSMENT & PLAN:   Resistant hypertension: Blood pressure is well-controlled in the office today.  On current regimen including carvedilol  25 mg in the morning, 6.25 mg at noon, and 25 mg in the evening along with prazosin  2 mg twice daily, and furosemide  20 mg twice daily, blood pressures have been much better controlled at home in the 1 teens to 130s mmHg systolic.  No longer on chlorthalidone .  We will continue her on this current regimen.  She prefers to not transition from furosemide  to spironolactone .  Discussed with the patient current dosing of carvedilol  is not typical, though this dosing regimen seems to be working quite well for her.  Due to multiple medication intolerances, we will not deviate at this time.  Check BMP with recommendation to adjust KCl repletion accordingly, prefers liquid moving forward.  PVCs: Status post ablation.  Remains on carvedilol .  Aortic insufficiency: Dates back to at least 2019 with some progression with most recent echo showing moderate aortic insufficiency.  Monitor with periodic echo.  Familial hyperlipidemia with statin intolerance: Most recent LDL of  190 from 05/2024.  Remains on Repatha .  She is concerned PCSK9  inhibitor may be contributing to elevated BP readings.  If she sees uptrend in BP readings following resumption of Repatha , she will let us  know with recommendation to refer to lipid clinic at that time.     Disposition: F/u with Dr. Darron or an APP in 2 months.   Medication Adjustments/Labs and Tests Ordered: Current medicines are reviewed at length with the patient today.  Concerns regarding medicines are outlined above. Medication changes, Labs and Tests ordered today are summarized above and listed in the Patient Instructions accessible in Encounters.   Signed, [name], PA-C 09/25/2024 4:12 PM     [facility] [place] [address] [place], [place] [address] [phone]

## 2024-09-26 ENCOUNTER — Other Ambulatory Visit
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
## 2024-10-02 ENCOUNTER — Ambulatory Visit
Admission: RE | Admit: 2024-10-02 | Discharge: 2024-10-02 | Disposition: A | Source: Ambulatory Visit | Attending: Orthopedic Surgery | Admitting: Orthopedic Surgery

## 2024-10-02 ENCOUNTER — Encounter: Payer: Self-pay | Admitting: Internal Medicine

## 2024-10-02 ENCOUNTER — Inpatient Hospital Stay
Admission: RE | Admit: 2024-10-02 | Discharge: 2024-10-02 | Disposition: A | Source: Ambulatory Visit | Attending: Orthopedic Surgery | Admitting: Orthopedic Surgery

## 2024-10-02 ENCOUNTER — Ambulatory Visit: Attending: Internal Medicine | Admitting: Internal Medicine

## 2024-10-02 VITALS — BP 136/92 | HR 67 | Temp 97.5°F | Resp 15 | Ht 65.0 in | Wt 190.8 lb

## 2024-10-02 DIAGNOSIS — M159 Polyosteoarthritis, unspecified: Secondary | ICD-10-CM | POA: Diagnosis not present

## 2024-10-02 DIAGNOSIS — R7689 Other specified abnormal immunological findings in serum: Secondary | ICD-10-CM | POA: Diagnosis not present

## 2024-10-02 DIAGNOSIS — M25511 Pain in right shoulder: Secondary | ICD-10-CM

## 2024-10-02 DIAGNOSIS — M25512 Pain in left shoulder: Secondary | ICD-10-CM

## 2024-10-02 DIAGNOSIS — K580 Irritable bowel syndrome with diarrhea: Secondary | ICD-10-CM | POA: Diagnosis not present

## 2024-10-02 DIAGNOSIS — M7918 Myalgia, other site: Secondary | ICD-10-CM

## 2024-10-02 DIAGNOSIS — R899 Unspecified abnormal finding in specimens from other organs, systems and tissues: Secondary | ICD-10-CM

## 2024-10-02 DIAGNOSIS — E063 Autoimmune thyroiditis: Secondary | ICD-10-CM

## 2024-10-02 DIAGNOSIS — M720 Palmar fascial fibromatosis [Dupuytren]: Secondary | ICD-10-CM | POA: Diagnosis not present

## 2024-10-02 DIAGNOSIS — I7300 Raynaud's syndrome without gangrene: Secondary | ICD-10-CM

## 2024-10-02 DIAGNOSIS — D4701 Cutaneous mastocytosis: Secondary | ICD-10-CM | POA: Diagnosis not present

## 2024-10-02 MED ORDER — IOPAMIDOL (ISOVUE-M 200) INJECTION 41%
10.0000 mL | Freq: Once | INTRAMUSCULAR | Status: AC
  Administered 2024-10-02: 10 mL via INTRA_ARTICULAR

## 2024-10-02 MED ORDER — GADOBENATE DIMEGLUMINE 529 MG/ML IV SOLN
0.1000 mL | Freq: Once | INTRAVENOUS | Status: AC | PRN
  Administered 2024-10-02: 0.1 mL via INTRA_ARTICULAR

## 2024-10-02 NOTE — Patient Instructions (Signed)
 I recommend checking out the [place] [facility] [place] patient-centered guide for fibromyalgia and chronic pain management: [email or web address]

## 2024-10-03 LAB — BASIC METABOLIC PANEL WITH GFR
BUN/Creatinine Ratio: 24 — ABNORMAL HIGH (ref 9–23)
BUN: 16 mg/dL (ref 6–24)
CO2: 26 mmol/L (ref 20–29)
Calcium: 10 mg/dL (ref 8.7–10.2)
Chloride: 98 mmol/L (ref 96–106)
Creatinine, Ser: 0.68 mg/dL (ref 0.57–1.00)
Glucose: 110 mg/dL — ABNORMAL HIGH (ref 70–99)
Potassium: 3.6 mmol/L (ref 3.5–5.2)
Sodium: 140 mmol/L (ref 134–144)
eGFR: 101 mL/min/1.73 (ref >59)

## 2024-10-04 LAB — SEDIMENTATION RATE: Sed Rate: 18 mm/h (ref 0–40)

## 2024-10-04 LAB — ANTI-SMITH ANTIBODY: ENA SM Ab Ser-aCnc: <0.2 (ref 0.0–0.9)

## 2024-10-04 LAB — RNP ANTIBODIES: ENA RNP Ab: <0.2 (ref 0.0–0.9)

## 2024-10-04 LAB — MITOCHONDRIAL/SMOOTH MUSCLE AB PNL
Mitochondrial Ab: 32.2 U — AB (ref 0.0–20.0)
Smooth Muscle Ab: 5 U (ref 0–19)

## 2024-10-04 LAB — C-REACTIVE PROTEIN: CRP: 6 mg/L (ref 0–10)

## 2024-10-04 LAB — IGG, IGA, IGM
IgG (Immunoglobin G), Serum: 813 mg/dL (ref 586–1602)
IgM (Immunoglobulin M), Srm: 66 mg/dL (ref 26–217)
Immunoglobulin A, (IgA) QN, Serum: 113 mg/dL (ref 87–352)

## 2024-10-04 LAB — ANTI-DNA ANTIBODY, DOUBLE-STRANDED: dsDNA Ab: <1 [IU]/mL (ref 0–9)

## 2024-10-04 LAB — C4 COMPLEMENT: Complement C4, Serum: 32 mg/dL (ref 12–38)

## 2024-10-04 MED ORDER — CHLORTHALIDONE 12.5 MG PO TABS: 12.5000 mg | ORAL_TABLET | Freq: Every day | ORAL | 3 refills | Status: DC

## 2024-10-04 MED ORDER — POTASSIUM CHLORIDE 20 MEQ PO PACK: 80.0000 meq | PACK | Freq: Every day | ORAL | 3 refills | Status: DC

## 2024-10-06 ENCOUNTER — Encounter: Payer: Self-pay | Admitting: Cardiology

## 2024-10-06 ENCOUNTER — Ambulatory Visit (HOSPITAL_BASED_OUTPATIENT_CLINIC_OR_DEPARTMENT_OTHER): Admission: EM | Admit: 2024-10-06 | Discharge: 2024-10-06 | Disposition: A | Discharge status: Home / Self Care

## 2024-10-06 ENCOUNTER — Encounter (HOSPITAL_BASED_OUTPATIENT_CLINIC_OR_DEPARTMENT_OTHER): Payer: Self-pay

## 2024-10-06 DIAGNOSIS — N029 Recurrent and persistent hematuria with unspecified morphologic changes: Secondary | ICD-10-CM | POA: Diagnosis not present

## 2024-10-06 DIAGNOSIS — G4733 Obstructive sleep apnea (adult) (pediatric): Secondary | ICD-10-CM

## 2024-10-06 LAB — POCT URINE DIPSTICK
Bilirubin, UA: NEGATIVE
Glucose, UA: NEGATIVE mg/dL
Ketones, POC UA: NEGATIVE mg/dL
Nitrite, UA: NEGATIVE
Protein Ur, POC: NEGATIVE mg/dL
Spec Grav, UA: 1.01 (ref 1.010–1.025)
Urobilinogen, UA: 0.2 U/dL
pH, UA: 7 (ref 5.0–8.0)

## 2024-10-06 MED ORDER — PHENAZOPYRIDINE HCL 200 MG PO TABS: 200.0000 mg | ORAL_TABLET | Freq: Three times a day (TID) | ORAL | 0 refills | Status: DC

## 2024-10-06 MED ORDER — NITROFURANTOIN MONOHYD MACRO 100 MG PO CAPS: 100.0000 mg | ORAL_CAPSULE | Freq: Two times a day (BID) | ORAL | 0 refills | Status: DC
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
## 2024-10-06 NOTE — ED Triage Notes (Signed)
 Pt c/o dysuria, vaginal itching, sharp left groin pain, right flank pain that radiates around to her groin, hematuria-pink when wiping, and urinary hesitancy. Pt reports hx of multiple kidney stones. Pt has not taken anything for her symptoms.

## 2024-10-06 NOTE — Discharge Instructions (Signed)
  1. Idiopathic hematuria, unspecified whether glomerular morphologic changes present (Primary) - POCT URINE DIPSTICK completed in UC shows small leukocytes and small blood, no nitrite, these findings are possibly indicative of urinary infection - Urine Culture collected in UC and sent to lab for further testing results should be available in 2 to 3 days. - nitrofurantoin , macrocrystal-monohydrate, (MACROBID ) 100 MG capsule; Take 1 capsule (100 mg total) by mouth 2 (two) times daily.  Dispense: 10 capsule; Refill: 0 - phenazopyridine  (PYRIDIUM ) 200 MG tablet; Take 1 tablet (200 mg total) by mouth 3 (three) times daily.  Dispense: 6 tablet; Refill: 0  -Continue to monitor symptoms for any change in severity if there is any escalation of current symptoms or development of new symptoms follow-up in ER for further evaluation and management.

## 2024-10-07 LAB — URINE CULTURE
Culture: <10000 — AB
Special Requests: NORMAL

## 2024-10-08 ENCOUNTER — Ambulatory Visit (HOSPITAL_COMMUNITY): Payer: Self-pay

## 2024-10-08 NOTE — Procedures (Signed)
   SLEEP STUDY REPORT Patient Information Study Date: 10/06/2024 Patient Name: Laura [name] Patient ID: [id number] Birth Date: [birth year] Age: 58 Gender: Female BMI: 30.9 (W=185 lb, H=5' 5'') Referring Physician: Reche Finder, NP  TEST DESCRIPTION: Home sleep apnea testing was completed using the WatchPat, a Type 1 device, utilizing peripheral arterial tonometry (PAT), chest movement, actigraphy, pulse oximetry, pulse rate, body position and snore. AHI was calculated with apnea and hypopnea using valid sleep time as the denominator. RDI includes apneas, hypopneas, and RERAs. The data acquired and the scoring of sleep and all associated events were performed in accordance with the recommended standards and specifications as outlined in the AASM Manual for the Scoring of Sleep and Associated Events 2.2.0 (2015).  FINDINGS:  1. Mild Obstructive Sleep Apnea with AHI 11.4/hr overall and severe during REM sleep with REM AHI 30.4/hr.  2. No Central Sleep Apnea with pAHIc 0.1/hr.  3. Oxygen desaturations as low as 87%.  4. Moderate snoring was present. O2 sats were < 88% for 3.1 min.  5. Total sleep time was 8 hrs and 24 min.  6. 28.7% of total sleep time was spent in REM sleep.  7. Normal sleep onset latency at 13 min.  8. Shortened REM sleep onset latency at 41 min.  9. Total awakenings were 10. 10. Arrhythmia detection: None  DIAGNOSIS: Mild Obstructive Sleep Apnea (G47.33) overall but Severe Obstructive Sleep Apnea in REM sleep.  RECOMMENDATIONS: 1. Clinical correlation of these findings is necessary. The decision to treat obstructive sleep apnea (OSA) is usually based on the presence of apnea symptoms or the presence of associated medical conditions such as Hypertension, Congestive Heart Failure, Atrial Fibrillation or Obesity. The most common symptoms of OSA are snoring, gasping for breath while sleeping, daytime sleepiness and fatigue. 2. Initiating apnea therapy is recommended  given the presence of symptoms and/or associated conditions. Recommend proceeding with one of the following:  a. Auto-CPAP therapy with a pressure range of 5-20cm H2O.  b. An oral appliance (OA) that can be obtained from certain dentists with expertise in sleep medicine. These are primarily of use in non-obese patients with mild and moderate disease.  c. An ENT consultation which may be useful to look for specific causes of obstruction and possible treatment options.  d. If patient is intolerant to PAP therapy, consider referral to ENT for evaluation for hypoglossal nerve stimulator. 3. Close follow-up is necessary to ensure success with CPAP or oral appliance therapy for maximum benefit . 4. A follow-up oximetry study on CPAP is recommended to assess the adequacy of therapy and determine the need for supplemental oxygen or the potential need for Bi-level therapy. An arterial blood gas to determine the adequacy of baseline ventilation and oxygenation should also be considered. 5. Healthy sleep recommendations include: adequate nightly sleep (normal 7-9 hrs/night), avoidance of caffeine after noon and alcohol near bedtime, and maintaining a sleep environment that is cool, dark and quiet. 6. Weight loss for overweight patients is recommended. Even modest amounts of weight loss can significantly improve the severity of sleep apnea. 7. Snoring recommendations include: weight loss where appropriate, side sleeping, and avoidance of alcohol before bed. 8. Operation of motor vehicle should be avoided when sleepy.  Signature: Wilbert Bihari, MD; [facility]; Diplomat, American Board of Sleep Medicine Electronically Signed: 10/08/2024 7:05:20 PM

## 2024-10-09 ENCOUNTER — Telehealth: Payer: Self-pay | Admitting: Orthopedic Surgery

## 2024-10-09 NOTE — Telephone Encounter (Signed)
 Patient portal message already sent to dr Addie to advise

## 2024-10-09 NOTE — Telephone Encounter (Signed)
 Patient called and said according to her MRI her shoulder is really bad and wants to know if it will affect her Knee surgery. [phone]

## 2024-10-10 ENCOUNTER — Encounter (HOSPITAL_COMMUNITY): Payer: Self-pay

## 2024-10-10 ENCOUNTER — Encounter: Payer: Self-pay | Admitting: Orthopedic Surgery

## 2024-10-10 ENCOUNTER — Encounter: Payer: Self-pay | Admitting: Radiology

## 2024-10-10 NOTE — Progress Notes (Signed)
 Surgical Instructions   Your procedure is scheduled on Tuesday October 16, 2024. Report to [facility] Main Entrance A at 6:45 A.M., then check in with the Admitting office. Any questions or running late day of surgery: call [phone]  Questions prior to your surgery date: call [phone], Monday-Friday, 8am-4pm. If you experience any cold or flu symptoms such as cough, fever, chills, shortness of breath, etc. between now and your scheduled surgery, please notify us  at the above number.     Remember:  Do not eat after midnight the night before your surgery  You may drink clear liquids until 5:45 the morning of your surgery.   Clear liquids allowed are: Water, Non-Citrus Juices (without pulp), Carbonated Beverages, Clear Tea (no milk, honey, etc.), Black Coffee Only (NO MILK, CREAM OR POWDERED CREAMER of any kind), and Gatorade.  Patient Instructions  The night before surgery:  No food after midnight. ONLY clear liquids after midnight  The day of surgery (if you have diabetes): Drink ONE (1) 12 oz G2 given to you in your pre admission testing appointment by 5:45 the morning of surgery. Drink in one sitting. Do not sip.  This drink was given to you during your hospital  pre-op appointment visit.  Nothing else to drink after completing the  12 oz bottle of G2.         If you have questions, please contact your surgeons office.     Take these medicines the morning of surgery with A SIP OF WATER  ALPRAZolam  (XANAX )  budeson-glycopyrrolate -formoterol  (BREZTRI  AEROSPHERE)  carvedilol  (COREG )  levothyroxine  (SYNTHROID )  nitrofurantoin , macrocrystal-monohydrate, (MACROBID )  phenazopyridine  (PYRIDIUM )  prazosin  (MINIPRESS )  Vonoprazan Fumarate  (VOQUEZNA )    May take these medicines IF NEEDED: albuterol  (VENTOLIN  HFA) 108 (90 Base) MCG/ACT inhaler Please bring with you to the hospital HYDROmorphone  (DILAUDID )  ipratropium-albuterol  (DUONEB)  ondansetron  (ZOFRAN -ODT)   promethazine  (PHENERGAN )  tiZANidine  (ZANAFLEX )  Ubrogepant  (UBRELVY )    One week prior to surgery, STOP taking any Aspirin (unless otherwise instructed by your surgeon) Aleve, Naproxen, Ibuprofen , Motrin , Advil , Goody's, BC's, all herbal medications, fish oil, and non-prescription vitamins.   WHAT DO I DO ABOUT MY DIABETES MEDICATION?   Do not take oral diabetes medicines (pills) the morning of surgery.        DO NOT TAKE YOUR Semaglutide , 1 MG/DOSE, (OZEMPIC )  7 DAYS PRIOR TO SURGERY WITH THE LAST DOSE BEING NO LATER THAN 10/08/2024.     The day of surgery, do not take other diabetes injectables, including Byetta (exenatide), Bydureon (exenatide ER), Victoza  (liraglutide ), or Trulicity (dulaglutide).  If your CBG is greater than 220 mg/dL, you may take  of your sliding scale (correction) dose of insulin .   HOW TO MANAGE YOUR DIABETES BEFORE AND AFTER SURGERY  Why is it important to control my blood sugar before and after surgery? Improving blood sugar levels before and after surgery helps healing and can limit problems. A way of improving blood sugar control is eating a healthy diet by:  Eating less sugar and carbohydrates  Increasing activity/exercise  Talking with your doctor about reaching your blood sugar goals High blood sugars (greater than 180 mg/dL) can raise your risk of infections and slow your recovery, so you will need to focus on controlling your diabetes during the weeks before surgery. Make sure that the doctor who takes care of your diabetes knows about your planned surgery including the date and location.  How do I manage my blood sugar before surgery? Check your blood sugar at  least 4 times a day, starting 2 days before surgery, to make sure that the level is not too high or low.  Check your blood sugar the morning of your surgery when you wake up and every 2 hours until you get to the Short Stay unit.  If your blood sugar is less than 70 mg/dL, you will  need to treat for low blood sugar: Do not take insulin . Treat a low blood sugar (less than 70 mg/dL) with  cup of clear juice (cranberry or apple), 4 glucose tablets, OR glucose gel. Recheck blood sugar in 15 minutes after treatment (to make sure it is greater than 70 mg/dL). If your blood sugar is not greater than 70 mg/dL on recheck, call [phone] for further instructions. Report your blood sugar to the short stay nurse when you get to Short Stay.  If you are admitted to the hospital after surgery: Your blood sugar will be checked by the staff and you will probably be given insulin  after surgery (instead of oral diabetes medicines) to make sure you have good blood sugar levels. The goal for blood sugar control after surgery is 80-180 mg/dL.                      Do NOT Smoke (Tobacco/Vaping) for 24 hours prior to your procedure.  If you use a CPAP at night, you may bring your mask/headgear for your overnight stay.   You will be asked to remove any contacts, glasses, piercing's, hearing aid's, dentures/partials prior to surgery. Please bring cases for these items if needed.    Patients discharged the day of surgery will not be allowed to drive home, and someone needs to stay with them for 24 hours.  SURGICAL WAITING ROOM VISITATION Patients may have no more than 2 support people in the waiting area - these visitors may rotate.   Pre-op nurse will coordinate an appropriate time for 1 ADULT support person, who may not rotate, to accompany patient in pre-op.  Children under the age of 82 must have an adult with them who is not the patient and must remain in the main waiting area with an adult.  If the patient needs to stay at the hospital during part of their recovery, the visitor guidelines for inpatient rooms apply.  Please refer to the [facility] website for the visitor guidelines for any additional information.   If you received a COVID test during your pre-op visit  it is  requested that you wear a mask when out in public, stay away from anyone that may not be feeling well and notify your surgeon if you develop symptoms. If you have been in contact with anyone that has tested positive in the last 10 days please notify you surgeon.      Pre-operative 4 CHG Bathing Instructions   You can play a key role in reducing the risk of infection after surgery. Your skin needs to be as free of germs as possible. You can reduce the number of germs on your skin by washing with CHG (chlorhexidine  gluconate) soap before surgery. CHG is an antiseptic soap that kills germs and continues to kill germs even after washing.   DO NOT use if you have an allergy  to chlorhexidine /CHG or antibacterial soaps. If your skin becomes reddened or irritated, stop using the CHG and notify one of our RNs at [phone].   Please shower with the CHG soap starting 4 days before surgery using the following schedule:  Please keep in mind the following:  DO NOT shave, including legs and underarms, starting the day of your first shower.   Place clean sheets on your bed the day you start using CHG soap. Use a clean washcloth (not used since being washed) for each shower. DO NOT sleep with pets once you start using the CHG.   CHG Shower Instructions:  Wash your face and private area with normal soap. If you choose to wash your hair, wash first with your normal shampoo.  After you use shampoo/soap, rinse your hair and body thoroughly to remove shampoo/soap residue.  Turn the water OFF and apply  bottle of CHG soap to a CLEAN washcloth.  Apply CHG soap ONLY FROM YOUR NECK DOWN TO YOUR TOES (washing for 3-5 minutes)  DO NOT use CHG soap on face, private areas, open wounds, or sores.  Pay special attention to the area where your surgery is being performed.  If you are having back surgery, having someone wash your back for you may be helpful. Wait 2 minutes after CHG soap is applied, then you may  rinse off the CHG soap.  Pat dry with a clean towel  Put on clean clothes/pajamas   If you choose to wear lotion, please use ONLY the CHG-compatible lotions that are listed below.  Additional instructions for the day of surgery:  If you choose, you may shower the morning of surgery with an antibacterial soap.  DO NOT APPLY any lotions, deodorants or perfumes.   Do not bring valuables to the hospital. [facility] is not responsible for any belongings/valuables. Do not wear nail polish, gel polish, artificial nails, or any other type of covering on natural nails (fingers and toes) Do not wear jewelry or makeup Put on clean/comfortable clothes.  Please brush your teeth.  Ask your nurse before applying any prescription medications to the skin.     CHG Compatible Lotions   Aveeno Moisturizing lotion  Cetaphil Moisturizing Cream  Cetaphil Moisturizing Lotion  Clairol Herbal Essence Moisturizing Lotion, Dry Skin  Clairol Herbal Essence Moisturizing Lotion, Extra Dry Skin  Clairol Herbal Essence Moisturizing Lotion, Normal Skin  Curel Age Defying Therapeutic Moisturizing Lotion with Alpha Hydroxy  Curel Extreme Care Body Lotion  Curel Soothing Hands Moisturizing Hand Lotion  Curel Therapeutic Moisturizing Cream, Fragrance-Free  Curel Therapeutic Moisturizing Lotion, Fragrance-Free  Curel Therapeutic Moisturizing Lotion, Original Formula  Eucerin Daily Replenishing Lotion  Eucerin Dry Skin Therapy Plus Alpha Hydroxy Crme  Eucerin Dry Skin Therapy Plus Alpha Hydroxy Lotion  Eucerin Original Crme  Eucerin Original Lotion  Eucerin Plus Crme Eucerin Plus Lotion  Eucerin TriLipid Replenishing Lotion  Keri Anti-Bacterial Hand Lotion  Keri Deep Conditioning Original Lotion Dry Skin Formula Softly Scented  Keri Deep Conditioning Original Lotion, Fragrance Free Sensitive Skin Formula  Keri Lotion Fast Absorbing Fragrance Free Sensitive Skin Formula  Keri Lotion Fast Absorbing Softly  Scented Dry Skin Formula  Keri Original Lotion  Keri Skin Renewal Lotion Keri Silky Smooth Lotion  Keri Silky Smooth Sensitive Skin Lotion  Nivea Body Creamy Conditioning Oil  Nivea Body Extra Enriched Lotion  Nivea Body Original Lotion  Nivea Body Sheer Moisturizing Lotion Nivea Crme  Nivea Skin Firming Lotion  NutraDerm 30 Skin Lotion  NutraDerm Skin Lotion  NutraDerm Therapeutic Skin Cream  NutraDerm Therapeutic Skin Lotion  ProShield Protective Hand Cream  Provon moisturizing lotion  Please read over the following fact sheets that you were given.

## 2024-10-10 NOTE — Telephone Encounter (Signed)
 I called patient and we talked about the MRI results.  Left shoulder looks good.  That once feeling better right shoulder still painful.  She is actually torn the rotator cuff medial to the prior repair.  No muscle atrophy so I think that that supraspinatus tear would be repairable.  The subscap and the infra look good.  She is talking about a reverse.  I think that could be in the conversation but my first preference would be towards rerepair of the supra with possible patch augmentation.  She is gena consider her options.  Earliest we would do shoulder surgery would be 3 months after her knee replacement.

## 2024-10-11 ENCOUNTER — Telehealth: Payer: Self-pay | Admitting: Physician Assistant

## 2024-10-11 ENCOUNTER — Encounter (HOSPITAL_COMMUNITY): Payer: Self-pay

## 2024-10-11 ENCOUNTER — Telehealth

## 2024-10-11 ENCOUNTER — Encounter: Payer: Self-pay | Admitting: Orthopedic Surgery

## 2024-10-11 ENCOUNTER — Ambulatory Visit: Payer: Self-pay | Admitting: Surgical

## 2024-10-11 ENCOUNTER — Inpatient Hospital Stay (HOSPITAL_COMMUNITY): Admission: RE | Admit: 2024-10-11 | Discharge: 2024-10-11 | Attending: Orthopedic Surgery

## 2024-10-11 ENCOUNTER — Other Ambulatory Visit: Payer: Self-pay

## 2024-10-11 VITALS — BP 149/87 | HR 76 | Temp 97.9°F | Resp 20 | Ht 65.0 in | Wt 186.0 lb

## 2024-10-11 DIAGNOSIS — E876 Hypokalemia: Secondary | ICD-10-CM

## 2024-10-11 DIAGNOSIS — Z01818 Encounter for other preprocedural examination: Secondary | ICD-10-CM

## 2024-10-11 LAB — CBC
HCT: 45.1 % (ref 36.0–46.0)
Hemoglobin: 15 g/dL (ref 12.0–15.0)
MCH: 27.9 pg (ref 26.0–34.0)
MCHC: 33.3 g/dL (ref 30.0–36.0)
MCV: 84 fL (ref 80.0–100.0)
Platelets: 461 K/uL — ABNORMAL HIGH (ref 150–400)
RBC: 5.37 MIL/uL — ABNORMAL HIGH (ref 3.87–5.11)
RDW: 13.7 % (ref 11.5–15.5)
WBC: 7 K/uL (ref 4.0–10.5)
nRBC: 0 % (ref 0.0–0.2)

## 2024-10-11 LAB — URINALYSIS, W/ REFLEX TO CULTURE (INFECTION SUSPECTED)
Bacteria, UA: NONE SEEN
Bilirubin Urine: NEGATIVE
Glucose, UA: NEGATIVE mg/dL
Hgb urine dipstick: NEGATIVE
Ketones, ur: NEGATIVE mg/dL
Leukocytes,Ua: NEGATIVE
Nitrite: NEGATIVE
Protein, ur: NEGATIVE mg/dL
Specific Gravity, Urine: 1.005 (ref 1.005–1.030)
pH: 7 (ref 5.0–8.0)

## 2024-10-11 LAB — BASIC METABOLIC PANEL WITH GFR
Anion gap: 10 (ref 5–15)
BUN: 17 mg/dL (ref 6–20)
CO2: 41 mmol/L — ABNORMAL HIGH (ref 22–32)
Calcium: 10.4 mg/dL — ABNORMAL HIGH (ref 8.9–10.3)
Chloride: 86 mmol/L — ABNORMAL LOW (ref 98–111)
Creatinine, Ser: 0.75 mg/dL (ref 0.44–1.00)
GFR, Estimated: >60 mL/min (ref >60)
Glucose, Bld: 121 mg/dL — ABNORMAL HIGH (ref 70–99)
Potassium: 2.7 mmol/L — CL (ref 3.5–5.1)
Sodium: 137 mmol/L (ref 135–145)

## 2024-10-11 LAB — SURGICAL PCR SCREEN
MRSA, PCR: NEGATIVE
Staphylococcus aureus: NEGATIVE

## 2024-10-11 LAB — HEMOGLOBIN A1C
Hgb A1c MFr Bld: 6 % — ABNORMAL HIGH (ref 4.8–5.6)
Mean Plasma Glucose: 125.5 mg/dL

## 2024-10-11 LAB — GLUCOSE, CAPILLARY: Glucose-Capillary: 165 mg/dL — ABNORMAL HIGH (ref 70–99)

## 2024-10-11 NOTE — Telephone Encounter (Signed)
 Called patient to report findings per [name]. Patient reports taking potassium as ordered daily, states she has been battling this for years and knew her potassium was low considering her symptoms. She will up her dose to 2 extra pills daily starting tonight and go to [facility] near her home Monday morning to get the labs drawn. Will continue to follow.
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
## 2024-10-11 NOTE — Progress Notes (Addendum)
 PCP - [facility] Cardiologist - Deatrice Cage     -clearance 08/31/24  PPM/ICD - denies Device Orders - n/a Rep Notified -  n/a  Chest x-ray - 08/02/24 EKG - 08/02/24 Stress Test - 07/30/24 ECHO - 08/16/24 Cardiac Cath - denies  Sleep Study - OSA but does not wear a CPAP CPAP - n/a  Fasting Blood Sugar - 130 Checks Blood Sugar ___2__ times a week  Last dose of GLP1 agonist-  last dose of Ozempic  was 12/1 GLP1 instructions: n/a  Blood Thinner Instructions: n/a Aspirin Instructions: n/a  ERAS Protcol -  clears until 0545 PRE-SURGERY Ensure or G2- G2 as ordered  COVID TEST- n/a   Anesthesia review:  yes - HTN, Hashimotos, OSA  Patient denies shortness of breath, fever, cough and chest pain at PAT appointment   All instructions explained to the patient, with a verbal understanding of the material. Patient agrees to go over the instructions while at home for a better understanding. Patient also instructed to self quarantine after being tested for COVID-19. The opportunity to ask questions was provided.  Patient states that she has completed her antibiotics for UTI.  UA collected at PAT.

## 2024-10-11 NOTE — Telephone Encounter (Signed)
 Labs obtained today through preoperative testing showed a potassium of 2.7.  I have reached out to the ordering provider to make them aware that our office will be rebleeding and checking this.  In reviewing her medication list, it appears that she is taking 40 mEq of potassium in the morning, 20 mEq of potassium in the afternoon, and 20 mEq of potassium in the evening for a total of 80 mEq daily.  She has previously declined and has reported intolerance to spironolactone  to assist with blood pressure and hypokalemia.  Recommendations: - Please verify that she is in fact 80 mEq of KCl - Increase KCl to 120 mEq daily - Recheck BMP on 10/15/2024

## 2024-10-11 NOTE — Addendum Note (Signed)
 Addended by: BRIEN SALM on: 10/11/2024 04:46 PM   Modules accepted: Orders

## 2024-10-11 NOTE — Progress Notes (Signed)
 Dr Addie called patient, she's been in touch with her doctor and it is being addressed as far as the Potassium level goes

## 2024-10-11 NOTE — Progress Notes (Signed)
 Patient's potassium resulted 2.7.  Notified [name], cardiology PA and [facility].  [name] stated that he will replete her potassium and have it rechecked through his office prior to surgery.

## 2024-10-12 ENCOUNTER — Inpatient Hospital Stay

## 2024-10-12 ENCOUNTER — Other Ambulatory Visit: Payer: Self-pay

## 2024-10-12 ENCOUNTER — Telehealth: Payer: Self-pay | Admitting: *Deleted

## 2024-10-12 DIAGNOSIS — E7800 Pure hypercholesterolemia, unspecified: Secondary | ICD-10-CM

## 2024-10-12 DIAGNOSIS — Z800 Family history of malignant neoplasm of digestive organs: Secondary | ICD-10-CM

## 2024-10-12 DIAGNOSIS — E876 Hypokalemia: Secondary | ICD-10-CM

## 2024-10-12 DIAGNOSIS — E782 Mixed hyperlipidemia: Secondary | ICD-10-CM

## 2024-10-12 DIAGNOSIS — Z803 Family history of malignant neoplasm of breast: Secondary | ICD-10-CM

## 2024-10-12 DIAGNOSIS — D4701 Cutaneous mastocytosis: Secondary | ICD-10-CM

## 2024-10-12 LAB — GENETIC SCREENING ORDER

## 2024-10-12 MED ORDER — POTASSIUM CHLORIDE 20 MEQ/15ML (10%) PO SOLN: 40.0000 meq | Freq: Three times a day (TID) | ORAL | 1 refills | Status: AC

## 2024-10-12 NOTE — Progress Notes (Signed)
 Anesthesia Chart Review:  58 year old female follows with cardiology for history of resistant HTN, moderate aortic insufficiency, familial hyperlipidemia with statin intolerance, hypokalemia, PVCs s/p ablation.  Nuclear stress test 07/2024 with no evidence of ischemia or infarction, overall low risk. Zio patch in 07/2024 showed a predominant rhythm of sinus with an average rate of 77 bpm, 1 run of SVT lasting 6 beats, and rare atrial and ventricular ectopy. Echo in 08/2024 showed an EF of 60 to 65%, no regional wall motion abnormalities, grade 1 diastolic dysfunction, normal RV systolic function and ventricular cavity size, trivial mitral regurgitation, and moderate eccentric aortic insufficiency.  Last seen in follow-up by [name], PA-C on 09/25/2024.  Noted to be doing well at that time.  Per note,  Blood pressure is well-controlled in the office today.  On current regimen including carvedilol  25 mg in the morning, 6.25 mg at noon, and 25 mg in the evening along with prazosin  2 mg twice daily, and furosemide  20 mg twice daily, blood pressures have been much better controlled at home in the 1 teens to 130s mmHg systolic.  No longer on chlorthalidone .  We will continue her on this current regimen.  Other pertinent history includes Hashimoto's thyroiditis with subsequent hypothyroidism, NIDDM2, multiple medication intolerances, Raynaud's, OSA intolerant to CPAP.  She was seen in urgent care on 10/06/2024 with complaint of dysuria and flank pain.  Treated for UTI with Macrobid .  Patient reports last dose of Ozempic  10/01/2024.  Preop labs reviewed, severe hypokalemia potassium 2.7, otherwise unremarkable.  DM2 well-controlled with A1c 6.0.  Potassium result was communicated to cardiology APP [name], PA-C.  He reached out to the patient with recommendations for titrating potassium supplement and will arrange for recheck prior to surgery.  Repeat BMP 10/15/2024 with potassium normalized to 3.8.   Anticipate she can proceed with surgery as planned.  EKG 08/02/24: Sinus rhythm.  Rate 78.  LAFB.  Anterior infarct, old.  No significant change.  TTE 08/16/2024: 1. Left ventricular ejection fraction, by estimation, is 60 to 65%. Left  ventricular ejection fraction by 3D volume is 66 %. The left ventricle has  normal function. The left ventricle has no regional wall motion  abnormalities. Left ventricular diastolic   parameters are consistent with Grade I diastolic dysfunction (impaired  relaxation). The average left ventricular global longitudinal strain is  -19.1 %. The global longitudinal strain is normal.   2. Right ventricular systolic function is normal. The right ventricular  size is normal.   3. The mitral valve is normal in structure. Trivial mitral valve  regurgitation. No evidence of mitral stenosis.   4. Eccentric aortic regurgitation jet, difficult to quantify but appears  moderate by PHT and VCW. The aortic valve is tricuspid. Aortic valve  regurgitation is moderate. Aortic valve sclerosis is present, with no  evidence of aortic valve stenosis. Aortic   regurgitation PHT measures 405 msec.   5. The inferior vena cava is normal in size with greater than 50%  respiratory variability, suggesting right atrial pressure of 3 mmHg.   Nuclear stress 07/30/2024:   The study is normal. The study is low risk.   No ST deviation was noted.   LV perfusion is normal. There is no evidence of ischemia. There is no evidence of infarction.   Left ventricular function is normal. End diastolic cavity size is normal. End systolic cavity size is normal.   Coronary calcium  was absent on the attenuation correction CT images.  Lynwood Geofm RIGGERS [facility] Short Stay Center/Anesthesiology Phone [phone] 10/15/2024 2:45 PM

## 2024-10-12 NOTE — Telephone Encounter (Signed)
 Please notify patient KCl suspension has been sent in.  Discontinue other potassium supplements.  Follow-up BMP early next week as previously recommended.

## 2024-10-12 NOTE — Addendum Note (Signed)
 Addended by: LINDSEY-MILLS, Dave Mergen on: 10/12/2024 10:33 AM   Modules accepted: Orders

## 2024-10-12 NOTE — Telephone Encounter (Signed)
 The patient has been notified of the result and verbalized understanding.  All questions (if any) were answered. Laura [name], CMA 10/12/2024 4:30 PM    Patient says she cannot tolerate a cpap. She has been on a cpap in the past and the provider could never get the setting right and it kept her stomach filled with air. She hated it and it was miserable. She wants another alternative.

## 2024-10-12 NOTE — Telephone Encounter (Signed)
-----   Message from Wilbert Bihari, MD sent at 10/08/2024  7:06 PM EST ----- Please let patient know that they have sleep apnea.  Recommend therapeutic CPAP titration for treatment of patient's sleep disordered breathing.

## 2024-10-15 DIAGNOSIS — Z800 Family history of malignant neoplasm of digestive organs: Secondary | ICD-10-CM | POA: Insufficient documentation

## 2024-10-15 DIAGNOSIS — Z803 Family history of malignant neoplasm of breast: Secondary | ICD-10-CM | POA: Insufficient documentation

## 2024-10-15 DIAGNOSIS — Z8042 Family history of malignant neoplasm of prostate: Secondary | ICD-10-CM | POA: Insufficient documentation

## 2024-10-15 DIAGNOSIS — E876 Hypokalemia: Secondary | ICD-10-CM

## 2024-10-15 LAB — BASIC METABOLIC PANEL WITH GFR
BUN/Creatinine Ratio: 28 — ABNORMAL HIGH (ref 9–23)
BUN: 20 mg/dL (ref 6–24)
CO2: 35 mmol/L — ABNORMAL HIGH (ref 20–29)
Calcium: 10.6 mg/dL — ABNORMAL HIGH (ref 8.7–10.2)
Chloride: 96 mmol/L (ref 96–106)
Creatinine, Ser: 0.72 mg/dL (ref 0.57–1.00)
Glucose: 142 mg/dL — ABNORMAL HIGH (ref 70–99)
Potassium: 3.8 mmol/L (ref 3.5–5.2)
Sodium: 143 mmol/L (ref 134–144)
eGFR: 96 mL/min/1.73 (ref >59)

## 2024-10-15 NOTE — Anesthesia Preprocedure Evaluation (Signed)
 Anesthesia Evaluation  Patient identified by MRN, date of birth, ID band Patient awake    Reviewed: Allergy  & Precautions, NPO status , Patient's Chart, lab work & pertinent test results, reviewed documented beta blocker date and time   History of Anesthesia Complications Negative for: history of anesthetic complications  Airway Mallampati: II  TM Distance: >3 FB Neck ROM: Full    Dental  (+) Teeth Intact, Dental Advisory Given   Pulmonary asthma , sleep apnea and Continuous Positive Airway Pressure Ventilation , former smoker   breath sounds clear to auscultation       Cardiovascular hypertension, Pt. on medications and Pt. on home beta blockers + Valvular Problems/Murmurs  Rhythm:Regular Rate:Normal  TTE 08/16/2024: 1. Left ventricular ejection fraction, by estimation, is 60 to 65%. Left  ventricular ejection fraction by 3D volume is 66 %. The left ventricle has  normal function. The left ventricle has no regional wall motion  abnormalities. Left ventricular diastolic   parameters are consistent with Grade I diastolic dysfunction (impaired  relaxation). The average left ventricular global longitudinal strain is  -19.1 %. The global longitudinal strain is normal.   2. Right ventricular systolic function is normal. The right ventricular  size is normal.   3. The mitral valve is normal in structure. Trivial mitral valve  regurgitation. No evidence of mitral stenosis.   4. Eccentric aortic regurgitation jet, difficult to quantify but appears  moderate by PHT and VCW. The aortic valve is tricuspid. Aortic valve  regurgitation is moderate. Aortic valve sclerosis is present, with no  evidence of aortic valve stenosis. Aortic   regurgitation PHT measures 405 msec.   5. The inferior vena cava is normal in size with greater than 50%  respiratory variability, suggesting right atrial pressure of 3 mmHg.     Neuro/Psych  Headaches   Anxiety Depression     Neuromuscular disease    GI/Hepatic hiatal hernia,GERD  ,,  Endo/Other  diabetes, Type 2Hypothyroidism    Renal/GU Renal disease     Musculoskeletal   Abdominal   Peds  Hematology   Anesthesia Other Findings Ozempic  last dose 10/01/24  Reproductive/Obstetrics                              Anesthesia Physical Anesthesia Plan  ASA: 3  Anesthesia Plan: General   Post-op Pain Management: Regional block*   Induction: Intravenous  PONV Risk Score and Plan: 2 and Propofol  infusion and Treatment may vary due to age or medical condition  Airway Management Planned: Oral ETT  Additional Equipment: None  Intra-op Plan:   Post-operative Plan: Extubation in OR  Informed Consent: I have reviewed the patients History and Physical, chart, labs and discussed the procedure including the risks, benefits and alternatives for the proposed anesthesia with the patient or authorized representative who has indicated his/her understanding and acceptance.     Dental advisory given  Plan Discussed with:   Anesthesia Plan Comments: (PAT note by Lynwood Hope, PA-C: 58 year old female follows with cardiology for history of resistant HTN, moderate aortic insufficiency, familial hyperlipidemia with statin intolerance, hypokalemia, PVCs s/p ablation.  Nuclear stress test 07/2024 with no evidence of ischemia or infarction, overall low risk. Zio patch in 07/2024 showed a predominant rhythm of sinus with an average rate of 77 bpm, 1 run of SVT lasting 6 beats, and rare atrial and ventricular ectopy. Echo in 08/2024 showed an EF of 60 to 65%, no regional wall  motion abnormalities, grade 1 diastolic dysfunction, normal RV systolic function and ventricular cavity size, trivial mitral regurgitation, and moderate eccentric aortic insufficiency.  Last seen in follow-up by [name], PA-C on 09/25/2024.  Noted to be doing well at that time.  Per note,  Blood pressure  is well-controlled in the office today.  On current regimen including carvedilol  25 mg in the morning, 6.25 mg at noon, and 25 mg in the evening along with prazosin  2 mg twice daily, and furosemide  20 mg twice daily, blood pressures have been much better controlled at home in the 1 teens to 130s mmHg systolic.  No longer on chlorthalidone .  We will continue her on this current regimen.  Other pertinent history includes Hashimoto's thyroiditis with subsequent hypothyroidism, NIDDM2, multiple medication intolerances, Raynaud's, OSA intolerant to CPAP.  She was seen in urgent care on 10/06/2024 with complaint of dysuria and flank pain.  Treated for UTI with Macrobid .  Patient reports last dose of Ozempic  10/01/2024.  Preop labs reviewed, severe hypokalemia potassium 2.7, otherwise unremarkable.  DM2 well-controlled with A1c 6.0.  Potassium result was communicated to cardiology APP Redell Bring, PA-C.  He reached out to the patient with recommendations for titrating potassium supplement and will arrange for recheck prior to surgery.  Repeat BMP 10/15/2024 with potassium normalized to 3.8.  Anticipate she can proceed with surgery as planned.  EKG 08/02/24: Sinus rhythm.  Rate 78.  LAFB.  Anterior infarct, old.  No significant change.  TTE 08/16/2024: 1. Left ventricular ejection fraction, by estimation, is 60 to 65%. Left  ventricular ejection fraction by 3D volume is 66 %. The left ventricle has  normal function. The left ventricle has no regional wall motion  abnormalities. Left ventricular diastolic   parameters are consistent with Grade I diastolic dysfunction (impaired  relaxation). The average left ventricular global longitudinal strain is  -19.1 %. The global longitudinal strain is normal.   2. Right ventricular systolic function is normal. The right ventricular  size is normal.   3. The mitral valve is normal in structure. Trivial mitral valve  regurgitation. No evidence of mitral stenosis.    4. Eccentric aortic regurgitation jet, difficult to quantify but appears  moderate by PHT and VCW. The aortic valve is tricuspid. Aortic valve  regurgitation is moderate. Aortic valve sclerosis is present, with no  evidence of aortic valve stenosis. Aortic   regurgitation PHT measures 405 msec.   5. The inferior vena cava is normal in size with greater than 50%  respiratory variability, suggesting right atrial pressure of 3 mmHg.   Nuclear stress 07/30/2024:   The study is normal. The study is low risk.   No ST deviation was noted.   LV perfusion is normal. There is no evidence of ischemia. There is no evidence of infarction.   Left ventricular function is normal. End diastolic cavity size is normal. End systolic cavity size is normal.   Coronary calcium  was absent on the attenuation correction CT images.     )         Anesthesia Quick Evaluation

## 2024-10-15 NOTE — Telephone Encounter (Signed)
 Gave patient recommendation to Dr Nena Riding  made by Dr. Shlomo and  understanding was verbalized. Referral has been placed.

## 2024-10-16 ENCOUNTER — Encounter (HOSPITAL_COMMUNITY): Payer: Self-pay | Admitting: Orthopedic Surgery

## 2024-10-16 ENCOUNTER — Inpatient Hospital Stay (HOSPITAL_COMMUNITY): Admitting: Physician Assistant

## 2024-10-16 ENCOUNTER — Other Ambulatory Visit: Payer: Self-pay

## 2024-10-16 ENCOUNTER — Inpatient Hospital Stay (HOSPITAL_COMMUNITY)
Admission: AD | Admit: 2024-10-16 | Discharge: 2024-10-22 | DRG: 470 | Disposition: A | Discharge status: Home Health | Attending: Orthopedic Surgery | Admitting: Orthopedic Surgery

## 2024-10-16 ENCOUNTER — Ambulatory Visit (HOSPITAL_COMMUNITY): Admitting: Physician Assistant

## 2024-10-16 ENCOUNTER — Ambulatory Visit: Payer: Self-pay | Admitting: Cardiovascular Disease

## 2024-10-16 ENCOUNTER — Encounter (HOSPITAL_COMMUNITY)
Admission: AD | Disposition: A | Discharge status: Home Health | Payer: Self-pay | Source: Home / Self Care | Attending: Orthopedic Surgery

## 2024-10-16 DIAGNOSIS — M19011 Primary osteoarthritis, right shoulder: Secondary | ICD-10-CM | POA: Diagnosis present

## 2024-10-16 DIAGNOSIS — Z90721 Acquired absence of ovaries, unilateral: Secondary | ICD-10-CM

## 2024-10-16 DIAGNOSIS — Z8616 Personal history of COVID-19: Secondary | ICD-10-CM

## 2024-10-16 DIAGNOSIS — Z823 Family history of stroke: Secondary | ICD-10-CM

## 2024-10-16 DIAGNOSIS — Z885 Allergy status to narcotic agent status: Secondary | ICD-10-CM

## 2024-10-16 DIAGNOSIS — Z6830 Body mass index (BMI) 30.0-30.9, adult: Secondary | ICD-10-CM

## 2024-10-16 DIAGNOSIS — Z7985 Long-term (current) use of injectable non-insulin antidiabetic drugs: Secondary | ICD-10-CM

## 2024-10-16 DIAGNOSIS — Z9079 Acquired absence of other genital organ(s): Secondary | ICD-10-CM

## 2024-10-16 DIAGNOSIS — M797 Fibromyalgia: Secondary | ICD-10-CM | POA: Diagnosis present

## 2024-10-16 DIAGNOSIS — Z833 Family history of diabetes mellitus: Secondary | ICD-10-CM

## 2024-10-16 DIAGNOSIS — Y92239 Unspecified place in hospital as the place of occurrence of the external cause: Secondary | ICD-10-CM | POA: Diagnosis not present

## 2024-10-16 DIAGNOSIS — I129 Hypertensive chronic kidney disease with stage 1 through stage 4 chronic kidney disease, or unspecified chronic kidney disease: Secondary | ICD-10-CM | POA: Diagnosis present

## 2024-10-16 DIAGNOSIS — G894 Chronic pain syndrome: Secondary | ICD-10-CM | POA: Diagnosis present

## 2024-10-16 DIAGNOSIS — Z87891 Personal history of nicotine dependence: Secondary | ICD-10-CM | POA: Diagnosis not present

## 2024-10-16 DIAGNOSIS — Z888 Allergy status to other drugs, medicaments and biological substances status: Secondary | ICD-10-CM

## 2024-10-16 DIAGNOSIS — I10 Essential (primary) hypertension: Secondary | ICD-10-CM | POA: Diagnosis not present

## 2024-10-16 DIAGNOSIS — J45909 Unspecified asthma, uncomplicated: Secondary | ICD-10-CM | POA: Diagnosis not present

## 2024-10-16 DIAGNOSIS — Z90711 Acquired absence of uterus with remaining cervical stump: Secondary | ICD-10-CM

## 2024-10-16 DIAGNOSIS — G4733 Obstructive sleep apnea (adult) (pediatric): Secondary | ICD-10-CM | POA: Diagnosis present

## 2024-10-16 DIAGNOSIS — R7303 Prediabetes: Secondary | ICD-10-CM | POA: Diagnosis present

## 2024-10-16 DIAGNOSIS — T502X5A Adverse effect of carbonic-anhydrase inhibitors, benzothiadiazides and other diuretics, initial encounter: Secondary | ICD-10-CM | POA: Diagnosis not present

## 2024-10-16 DIAGNOSIS — Z79890 Hormone replacement therapy (postmenopausal): Secondary | ICD-10-CM

## 2024-10-16 DIAGNOSIS — I2510 Atherosclerotic heart disease of native coronary artery without angina pectoris: Secondary | ICD-10-CM | POA: Diagnosis present

## 2024-10-16 DIAGNOSIS — Z800 Family history of malignant neoplasm of digestive organs: Secondary | ICD-10-CM

## 2024-10-16 DIAGNOSIS — E876 Hypokalemia: Secondary | ICD-10-CM | POA: Diagnosis not present

## 2024-10-16 DIAGNOSIS — Z803 Family history of malignant neoplasm of breast: Secondary | ICD-10-CM

## 2024-10-16 DIAGNOSIS — Z79899 Other long term (current) drug therapy: Secondary | ICD-10-CM

## 2024-10-16 DIAGNOSIS — F32A Depression, unspecified: Secondary | ICD-10-CM | POA: Diagnosis present

## 2024-10-16 DIAGNOSIS — K219 Gastro-esophageal reflux disease without esophagitis: Secondary | ICD-10-CM | POA: Diagnosis present

## 2024-10-16 DIAGNOSIS — Z01818 Encounter for other preprocedural examination: Principal | ICD-10-CM

## 2024-10-16 DIAGNOSIS — M1711 Unilateral primary osteoarthritis, right knee: Secondary | ICD-10-CM

## 2024-10-16 DIAGNOSIS — E66811 Obesity, class 1: Secondary | ICD-10-CM | POA: Diagnosis present

## 2024-10-16 DIAGNOSIS — Z90710 Acquired absence of both cervix and uterus: Secondary | ICD-10-CM

## 2024-10-16 DIAGNOSIS — Z974 Presence of external hearing-aid: Secondary | ICD-10-CM

## 2024-10-16 DIAGNOSIS — Z7982 Long term (current) use of aspirin: Secondary | ICD-10-CM

## 2024-10-16 DIAGNOSIS — Z96651 Presence of right artificial knee joint: Secondary | ICD-10-CM

## 2024-10-16 DIAGNOSIS — F411 Generalized anxiety disorder: Secondary | ICD-10-CM | POA: Diagnosis present

## 2024-10-16 DIAGNOSIS — Z8042 Family history of malignant neoplasm of prostate: Secondary | ICD-10-CM

## 2024-10-16 DIAGNOSIS — Z83438 Family history of other disorder of lipoprotein metabolism and other lipidemia: Secondary | ICD-10-CM

## 2024-10-16 DIAGNOSIS — Z886 Allergy status to analgesic agent status: Secondary | ICD-10-CM

## 2024-10-16 DIAGNOSIS — E063 Autoimmune thyroiditis: Secondary | ICD-10-CM | POA: Diagnosis present

## 2024-10-16 DIAGNOSIS — Z87442 Personal history of urinary calculi: Secondary | ICD-10-CM

## 2024-10-16 DIAGNOSIS — K760 Fatty (change of) liver, not elsewhere classified: Secondary | ICD-10-CM | POA: Diagnosis present

## 2024-10-16 DIAGNOSIS — J4520 Mild intermittent asthma, uncomplicated: Secondary | ICD-10-CM | POA: Diagnosis present

## 2024-10-16 DIAGNOSIS — E782 Mixed hyperlipidemia: Secondary | ICD-10-CM | POA: Diagnosis present

## 2024-10-16 DIAGNOSIS — Z91013 Allergy to seafood: Secondary | ICD-10-CM

## 2024-10-16 HISTORY — PX: TOTAL KNEE ARTHROPLASTY: SHX125

## 2024-10-16 LAB — GLUCOSE, CAPILLARY
Glucose-Capillary: 140 mg/dL — ABNORMAL HIGH (ref 70–99)
Glucose-Capillary: 128 mg/dL — ABNORMAL HIGH (ref 70–99)
Glucose-Capillary: 141 mg/dL — ABNORMAL HIGH (ref 70–99)
Glucose-Capillary: 148 mg/dL — ABNORMAL HIGH (ref 70–99)

## 2024-10-16 MED ORDER — VANCOMYCIN HCL 1000 MG IV SOLR
INTRAVENOUS | Status: DC | PRN
  Administered 2024-10-16: 12:00:00 1000 mg via TOPICAL

## 2024-10-16 MED ORDER — ONDANSETRON HCL 4 MG/2ML IJ SOLN
INTRAMUSCULAR | Status: DC | PRN
  Administered 2024-10-16: 12:00:00 4 mg via INTRAVENOUS

## 2024-10-16 MED ORDER — LACTATED RINGERS IV SOLN: INTRAVENOUS | Status: DC

## 2024-10-16 MED ORDER — SODIUM CHLORIDE 0.9 % IV SOLN: Freq: Once | INTRAVENOUS | Status: AC

## 2024-10-16 MED ORDER — PHENAZOPYRIDINE HCL 200 MG PO TABS: 200.0000 mg | ORAL_TABLET | Freq: Three times a day (TID) | ORAL | Status: DC

## 2024-10-16 MED ORDER — FENTANYL CITRATE (PF) 100 MCG/2ML IJ SOLN
INTRAMUSCULAR | Status: AC
  Filled 2024-10-16: qty 2

## 2024-10-16 MED ORDER — BUPIVACAINE LIPOSOME 1.3 % IJ SUSP
INTRAMUSCULAR | Status: AC
  Filled 2024-10-16: qty 20

## 2024-10-16 MED ORDER — DOCUSATE SODIUM 100 MG PO CAPS
100.0000 mg | ORAL_CAPSULE | Freq: Two times a day (BID) | ORAL | Status: DC
  Administered 2024-10-16 – 2024-10-22 (×11): 100 mg via ORAL
  Filled 2024-10-16 (×12): qty 1

## 2024-10-16 MED ORDER — AMISULPRIDE (ANTIEMETIC) 5 MG/2ML IV SOLN
10.0000 mg | Freq: Once | INTRAVENOUS | Status: AC | PRN
  Administered 2024-10-16: 14:00:00 10 mg via INTRAVENOUS

## 2024-10-16 MED ORDER — POVIDONE-IODINE 7.5 % EX SOLN
Freq: Once | CUTANEOUS | Status: DC
  Filled 2024-10-16: qty 118

## 2024-10-16 MED ORDER — METOCLOPRAMIDE HCL 5 MG/ML IJ SOLN
5.0000 mg | Freq: Three times a day (TID) | INTRAMUSCULAR | Status: DC | PRN
  Administered 2024-10-16: 16:00:00 5 mg via INTRAVENOUS
  Filled 2024-10-16: qty 2

## 2024-10-16 MED ORDER — PHENYLEPHRINE HCL-NACL 20-0.9 MG/250ML-% IV SOLN
INTRAVENOUS | Status: DC | PRN
  Administered 2024-10-16: 11:00:00 80 ug via INTRAVENOUS

## 2024-10-16 MED ORDER — EPHEDRINE 5 MG/ML INJ
INTRAVENOUS | Status: AC
  Filled 2024-10-16: qty 5

## 2024-10-16 MED ORDER — ACETAMINOPHEN 500 MG PO TABS
1000.0000 mg | ORAL_TABLET | Freq: Four times a day (QID) | ORAL | Status: AC
  Administered 2024-10-16 – 2024-10-17 (×4): 1000 mg via ORAL
  Filled 2024-10-16 (×4): qty 2

## 2024-10-16 MED ORDER — TRANEXAMIC ACID 1000 MG/10ML IV SOLN
2000.0000 mg | INTRAVENOUS | Status: DC
  Filled 2024-10-16 (×2): qty 20

## 2024-10-16 MED ORDER — METOCLOPRAMIDE HCL 5 MG PO TABS: 5.0000 mg | ORAL_TABLET | Freq: Three times a day (TID) | ORAL | Status: DC | PRN

## 2024-10-16 MED ORDER — MIDAZOLAM HCL 2 MG/2ML IJ SOLN
INTRAMUSCULAR | Status: AC
  Filled 2024-10-16: qty 2

## 2024-10-16 MED ORDER — ROCURONIUM BROMIDE 10 MG/ML (PF) SYRINGE
PREFILLED_SYRINGE | INTRAVENOUS | Status: AC
  Filled 2024-10-16: qty 10

## 2024-10-16 MED ORDER — PHENOL 1.4 % MT LIQD: 1.0000 | OROMUCOSAL | Status: DC | PRN

## 2024-10-16 MED ORDER — LIDOCAINE 2% (20 MG/ML) 5 ML SYRINGE
INTRAMUSCULAR | Status: AC
  Filled 2024-10-16: qty 5

## 2024-10-16 MED ORDER — DEXAMETHASONE SOD PHOSPHATE PF 10 MG/ML IJ SOLN
INTRAMUSCULAR | Status: DC | PRN
  Administered 2024-10-16: 11:00:00 5 mg via INTRAVENOUS

## 2024-10-16 MED ORDER — ACETAMINOPHEN 10 MG/ML IV SOLN
INTRAVENOUS | Status: DC | PRN
  Administered 2024-10-16: 12:00:00 1000 mg via INTRAVENOUS

## 2024-10-16 MED ORDER — MIDAZOLAM HCL (PF) 2 MG/2ML IJ SOLN: 4.0000 mg | Freq: Once | INTRAMUSCULAR | Status: AC

## 2024-10-16 MED ORDER — 0.9 % SODIUM CHLORIDE (POUR BTL) OPTIME
TOPICAL | Status: DC | PRN
  Administered 2024-10-16: 11:00:00 1000 mL

## 2024-10-16 MED ORDER — CARVEDILOL 25 MG PO TABS
25.0000 mg | ORAL_TABLET | Freq: Two times a day (BID) | ORAL | Status: DC
  Administered 2024-10-16 – 2024-10-21 (×9): 25 mg via ORAL
  Filled 2024-10-16 (×10): qty 1

## 2024-10-16 MED ORDER — CLONIDINE HCL (ANALGESIA) 100 MCG/ML EP SOLN
EPIDURAL | Status: AC
  Filled 2024-10-16: qty 10

## 2024-10-16 MED ORDER — FENTANYL CITRATE (PF) 250 MCG/5ML IJ SOLN
INTRAMUSCULAR | Status: DC | PRN
  Administered 2024-10-16 (×2): 100 ug via INTRAVENOUS

## 2024-10-16 MED ORDER — OXYCODONE HCL 5 MG/5ML PO SOLN: 5.0000 mg | Freq: Once | ORAL | Status: DC | PRN

## 2024-10-16 MED ORDER — FENTANYL CITRATE (PF) 100 MCG/2ML IJ SOLN
INTRAMUSCULAR | Status: AC
  Administered 2024-10-16: 09:00:00 100 ug via INTRAVENOUS
  Filled 2024-10-16: qty 2

## 2024-10-16 MED ORDER — POVIDONE-IODINE 10 % EX SWAB
2.0000 | Freq: Once | CUTANEOUS | Status: AC
  Administered 2024-10-16: 08:00:00 2 via TOPICAL

## 2024-10-16 MED ORDER — ROCURONIUM BROMIDE 10 MG/ML (PF) SYRINGE
PREFILLED_SYRINGE | INTRAVENOUS | Status: DC | PRN
  Administered 2024-10-16: 11:00:00 60 mg via INTRAVENOUS
  Administered 2024-10-16 (×2): 20 mg via INTRAVENOUS

## 2024-10-16 MED ORDER — PROPOFOL 10 MG/ML IV BOLUS
INTRAVENOUS | Status: DC | PRN
  Administered 2024-10-16: 11:00:00 100 mg via INTRAVENOUS

## 2024-10-16 MED ORDER — LACTATED RINGERS IV SOLN: INTRAVENOUS | Status: DC | PRN

## 2024-10-16 MED ORDER — ALBUTEROL SULFATE (2.5 MG/3ML) 0.083% IN NEBU: 2.5000 mg | INHALATION_SOLUTION | RESPIRATORY_TRACT | Status: AC | PRN

## 2024-10-16 MED ORDER — PHENYLEPHRINE 80 MCG/ML (10ML) SYRINGE FOR IV PUSH (FOR BLOOD PRESSURE SUPPORT)
PREFILLED_SYRINGE | INTRAVENOUS | Status: AC
  Filled 2024-10-16: qty 10

## 2024-10-16 MED ORDER — ALPRAZOLAM 0.5 MG PO TABS
1.0000 mg | ORAL_TABLET | Freq: Three times a day (TID) | ORAL | Status: DC
  Administered 2024-10-16 – 2024-10-22 (×18): 1 mg via ORAL
  Filled 2024-10-16 (×18): qty 2

## 2024-10-16 MED ORDER — FENTANYL CITRATE (PF) 250 MCG/5ML IJ SOLN
INTRAMUSCULAR | Status: AC
  Filled 2024-10-16: qty 5

## 2024-10-16 MED ORDER — HYDROMORPHONE HCL 1 MG/ML IJ SOLN
INTRAMUSCULAR | Status: DC | PRN
  Administered 2024-10-16: 12:00:00 .5 mg via INTRAVENOUS

## 2024-10-16 MED ORDER — KETAMINE HCL 50 MG/5ML IJ SOSY
PREFILLED_SYRINGE | INTRAMUSCULAR | Status: AC
  Filled 2024-10-16: qty 5

## 2024-10-16 MED ORDER — MENTHOL 3 MG MT LOZG: 1.0000 | LOZENGE | OROMUCOSAL | Status: DC | PRN

## 2024-10-16 MED ORDER — BUPIVACAINE HCL 0.25 % IJ SOLN
INTRAMUSCULAR | Status: DC | PRN
  Administered 2024-10-16: 12:00:00 30 mL

## 2024-10-16 MED ORDER — SUGAMMADEX SODIUM 200 MG/2ML IV SOLN
INTRAVENOUS | Status: AC
  Filled 2024-10-16: qty 2

## 2024-10-16 MED ORDER — BISACODYL 5 MG PO TBEC
5.0000 mg | DELAYED_RELEASE_TABLET | Freq: Every day | ORAL | Status: DC | PRN
  Administered 2024-10-18: 05:00:00 5 mg via ORAL
  Filled 2024-10-16 (×2): qty 1

## 2024-10-16 MED ORDER — STERILE WATER FOR IRRIGATION IR SOLN
Status: DC | PRN
  Administered 2024-10-16: 11:00:00 1000 mL

## 2024-10-16 MED ORDER — MIDAZOLAM HCL 2 MG/2ML IJ SOLN
INTRAMUSCULAR | Status: AC
  Administered 2024-10-16: 09:00:00 4 mg via INTRAVENOUS
  Filled 2024-10-16: qty 4

## 2024-10-16 MED ORDER — OXYCODONE HCL 5 MG PO TABS: 5.0000 mg | ORAL_TABLET | Freq: Once | ORAL | Status: DC | PRN

## 2024-10-16 MED ORDER — IRRISEPT - 450ML BOTTLE WITH 0.05% CHG IN STERILE WATER, USP 99.95% OPTIME
TOPICAL | Status: DC | PRN
  Administered 2024-10-16: 11:00:00 450 mL

## 2024-10-16 MED ORDER — ACETAMINOPHEN 325 MG PO TABS
325.0000 mg | ORAL_TABLET | Freq: Four times a day (QID) | ORAL | Status: DC | PRN
  Administered 2024-10-18: 11:00:00 650 mg via ORAL
  Filled 2024-10-16: qty 2

## 2024-10-16 MED ORDER — CARVEDILOL 6.25 MG PO TABS
6.2500 mg | ORAL_TABLET | Freq: Every day | ORAL | Status: DC
  Administered 2024-10-18 – 2024-10-20 (×2): 6.25 mg via ORAL
  Filled 2024-10-16 (×3): qty 1

## 2024-10-16 MED ORDER — ONDANSETRON HCL 4 MG/2ML IJ SOLN: 4.0000 mg | Freq: Once | INTRAMUSCULAR | Status: DC | PRN

## 2024-10-16 MED ORDER — HYDROMORPHONE HCL 1 MG/ML IJ SOLN
0.5000 mg | INTRAMUSCULAR | Status: DC | PRN
  Administered 2024-10-16 – 2024-10-21 (×15): 0.5 mg via INTRAVENOUS
  Filled 2024-10-16 (×15): qty 0.5

## 2024-10-16 MED ORDER — CHLORTHALIDONE 25 MG PO TABS
12.5000 mg | ORAL_TABLET | Freq: Every morning | ORAL | Status: DC
  Administered 2024-10-16 – 2024-10-18 (×3): 12.5 mg via ORAL
  Filled 2024-10-16 (×3): qty 1

## 2024-10-16 MED ORDER — FENTANYL CITRATE (PF) 100 MCG/2ML IJ SOLN
25.0000 ug | INTRAMUSCULAR | Status: DC | PRN
  Administered 2024-10-16 (×3): 25 ug via INTRAVENOUS

## 2024-10-16 MED ORDER — BUPIVACAINE HCL (PF) 0.25 % IJ SOLN
INTRAMUSCULAR | Status: AC
  Filled 2024-10-16: qty 30

## 2024-10-16 MED ORDER — BUDESON-GLYCOPYRROL-FORMOTEROL 160-9-4.8 MCG/ACT IN AERO
2.0000 | INHALATION_SPRAY | Freq: Two times a day (BID) | RESPIRATORY_TRACT | Status: DC
  Administered 2024-10-17 – 2024-10-21 (×7): 2 via RESPIRATORY_TRACT
  Filled 2024-10-16: qty 5.9

## 2024-10-16 MED ORDER — MORPHINE SULFATE (PF) 4 MG/ML IV SOLN
INTRAVENOUS | Status: DC | PRN
  Administered 2024-10-16: 12:00:00 8 mg via INTRAMUSCULAR

## 2024-10-16 MED ORDER — VONOPRAZAN FUMARATE 10 MG PO TABS: 10.0000 mg | ORAL_TABLET | Freq: Every day | ORAL | Status: DC

## 2024-10-16 MED ORDER — METHOCARBAMOL 1000 MG/10ML IJ SOLN
500.0000 mg | Freq: Four times a day (QID) | INTRAMUSCULAR | Status: DC | PRN
  Administered 2024-10-17 – 2024-10-18 (×3): 500 mg via INTRAVENOUS
  Filled 2024-10-16 (×2): qty 10

## 2024-10-16 MED ORDER — PRAZOSIN HCL 2 MG PO CAPS
2.0000 mg | ORAL_CAPSULE | Freq: Two times a day (BID) | ORAL | Status: DC
  Administered 2024-10-18 – 2024-10-21 (×6): 2 mg via ORAL
  Filled 2024-10-16 (×11): qty 1

## 2024-10-16 MED ORDER — AMISULPRIDE (ANTIEMETIC) 5 MG/2ML IV SOLN
INTRAVENOUS | Status: AC
  Filled 2024-10-16: qty 4

## 2024-10-16 MED ORDER — CEFAZOLIN SODIUM-DEXTROSE 2-4 GM/100ML-% IV SOLN
2.0000 g | Freq: Three times a day (TID) | INTRAVENOUS | Status: AC
  Administered 2024-10-16 (×2): 2 g via INTRAVENOUS
  Filled 2024-10-16 (×2): qty 100

## 2024-10-16 MED ORDER — FUROSEMIDE 20 MG PO TABS
20.0000 mg | ORAL_TABLET | Freq: Two times a day (BID) | ORAL | Status: DC
  Administered 2024-10-16 – 2024-10-18 (×4): 20 mg via ORAL
  Filled 2024-10-16 (×4): qty 1

## 2024-10-16 MED ORDER — SODIUM CHLORIDE 0.9% FLUSH
INTRAVENOUS | Status: DC | PRN
  Administered 2024-10-16: 12:00:00 20 mL via INTRAVENOUS

## 2024-10-16 MED ORDER — ORAL CARE MOUTH RINSE: 15.0000 mL | Freq: Once | OROMUCOSAL | Status: AC

## 2024-10-16 MED ORDER — VANCOMYCIN HCL 1000 MG IV SOLR
INTRAVENOUS | Status: AC
  Filled 2024-10-16: qty 20

## 2024-10-16 MED ORDER — CLONIDINE HCL (ANALGESIA) 100 MCG/ML EP SOLN
EPIDURAL | Status: DC | PRN
  Administered 2024-10-16: 12:00:00 1 mL via INTRA_ARTICULAR

## 2024-10-16 MED ORDER — TRANEXAMIC ACID-NACL 1000-0.7 MG/100ML-% IV SOLN
1000.0000 mg | INTRAVENOUS | Status: AC
  Administered 2024-10-16: 11:00:00 1000 mg via INTRAVENOUS
  Filled 2024-10-16: qty 100

## 2024-10-16 MED ORDER — MORPHINE SULFATE (PF) 4 MG/ML IV SOLN
INTRAVENOUS | Status: AC
  Filled 2024-10-16: qty 2

## 2024-10-16 MED ORDER — ROPIVACAINE HCL 5 MG/ML IJ SOLN
INTRAMUSCULAR | Status: DC | PRN
  Administered 2024-10-16: 10:00:00 20 mL via PERINEURAL

## 2024-10-16 MED ORDER — ZOLPIDEM TARTRATE 5 MG PO TABS
5.0000 mg | ORAL_TABLET | Freq: Every day | ORAL | Status: DC
  Administered 2024-10-16 – 2024-10-21 (×6): 5 mg via ORAL
  Filled 2024-10-16 (×6): qty 1

## 2024-10-16 MED ORDER — PROPOFOL 10 MG/ML IV BOLUS
INTRAVENOUS | Status: AC
  Filled 2024-10-16: qty 20

## 2024-10-16 MED ORDER — POVIDONE-IODINE 10 % EX SWAB: 2.0000 | Freq: Once | CUTANEOUS | Status: DC

## 2024-10-16 MED ORDER — MIDAZOLAM HCL (PF) 2 MG/2ML IJ SOLN
INTRAMUSCULAR | Status: DC | PRN
  Administered 2024-10-16: 11:00:00 2 mg via INTRAVENOUS

## 2024-10-16 MED ORDER — KETAMINE HCL 50 MG/5ML IJ SOSY
PREFILLED_SYRINGE | INTRAMUSCULAR | Status: DC | PRN
  Administered 2024-10-16: 11:00:00 30 mg via INTRAVENOUS

## 2024-10-16 MED ORDER — SUGAMMADEX SODIUM 200 MG/2ML IV SOLN
INTRAVENOUS | Status: DC | PRN
  Administered 2024-10-16: 13:00:00 200 mg via INTRAVENOUS

## 2024-10-16 MED ORDER — HYDROMORPHONE HCL 2 MG PO TABS
4.0000 mg | ORAL_TABLET | ORAL | Status: DC | PRN
  Administered 2024-10-16 – 2024-10-22 (×12): 4 mg via ORAL
  Filled 2024-10-16 (×12): qty 2

## 2024-10-16 MED ORDER — LIDOCAINE 2% (20 MG/ML) 5 ML SYRINGE
INTRAMUSCULAR | Status: DC | PRN
  Administered 2024-10-16: 11:00:00 100 mg via INTRAVENOUS

## 2024-10-16 MED ORDER — CEFAZOLIN SODIUM-DEXTROSE 2-4 GM/100ML-% IV SOLN
2.0000 g | INTRAVENOUS | Status: AC
  Administered 2024-10-16: 11:00:00 2 g via INTRAVENOUS
  Filled 2024-10-16: qty 100

## 2024-10-16 MED ORDER — CHLORHEXIDINE GLUCONATE 0.12 % MT SOLN
15.0000 mL | Freq: Once | OROMUCOSAL | Status: AC
  Administered 2024-10-16: 08:00:00 15 mL via OROMUCOSAL
  Filled 2024-10-16: qty 15

## 2024-10-16 MED ORDER — METHOCARBAMOL 500 MG PO TABS
500.0000 mg | ORAL_TABLET | Freq: Four times a day (QID) | ORAL | Status: DC | PRN
  Administered 2024-10-17 – 2024-10-20 (×5): 500 mg via ORAL
  Filled 2024-10-16 (×5): qty 1

## 2024-10-16 MED ORDER — BUPIVACAINE LIPOSOME 1.3 % IJ SUSP
INTRAMUSCULAR | Status: DC | PRN
  Administered 2024-10-16: 12:00:00 20 mL

## 2024-10-16 MED ORDER — LEVOTHYROXINE SODIUM 88 MCG PO TABS
88.0000 ug | ORAL_TABLET | Freq: Every day | ORAL | Status: DC
  Administered 2024-10-17 – 2024-10-22 (×6): 88 ug via ORAL
  Filled 2024-10-16 (×6): qty 1

## 2024-10-16 MED ORDER — ACETAMINOPHEN 10 MG/ML IV SOLN: 1000.0000 mg | Freq: Once | INTRAVENOUS | Status: DC | PRN

## 2024-10-16 MED ORDER — ONDANSETRON HCL 4 MG/2ML IJ SOLN
INTRAMUSCULAR | Status: AC
  Filled 2024-10-16: qty 2

## 2024-10-16 MED ORDER — FENTANYL CITRATE (PF) 100 MCG/2ML IJ SOLN: 100.0000 ug | Freq: Once | INTRAMUSCULAR | Status: AC

## 2024-10-16 MED ORDER — NAPROXEN 250 MG PO TABS
250.0000 mg | ORAL_TABLET | Freq: Two times a day (BID) | ORAL | Status: DC
  Administered 2024-10-19 (×2): 250 mg via ORAL
  Filled 2024-10-16 (×8): qty 1

## 2024-10-16 MED ORDER — ONDANSETRON HCL 4 MG PO TABS: 4.0000 mg | ORAL_TABLET | Freq: Four times a day (QID) | ORAL | Status: DC | PRN

## 2024-10-16 MED ORDER — ASPIRIN 81 MG PO CHEW
81.0000 mg | CHEWABLE_TABLET | Freq: Two times a day (BID) | ORAL | Status: DC
  Administered 2024-10-16 – 2024-10-22 (×12): 81 mg via ORAL
  Filled 2024-10-16 (×12): qty 1

## 2024-10-16 SURGICAL SUPPLY — 2 items
NDL 22X1.5 STRL (OR ONLY) (MISCELLANEOUS) ×2 IMPLANT
NDL SPNL 18GX3.5 QUINCKE PK (NEEDLE) ×1 IMPLANT

## 2024-10-16 NOTE — Plan of Care (Signed)
  Problem: Education: Goal: Knowledge of General Education information will improve Description: Including pain rating scale, medication(s)/side effects and non-pharmacologic comfort measures Outcome: Progressing   Problem: Clinical Measurements: Goal: Respiratory complications will improve Outcome: Progressing Goal: Cardiovascular complication will be avoided Outcome: Progressing   Problem: Coping: Goal: Level of anxiety will decrease Outcome: Progressing   

## 2024-10-16 NOTE — Op Note (Unsigned)
 Laura [name], Laura [name]. MEDICAL RECORD NO: [id number] ACCOUNT NO: [id number] DATE OF BIRTH: 1966-01-16 FACILITY: [facility] LOCATION: [facility] PHYSICIAN: Cordella RAMAN. Addie, MD  Operative Report   DATE OF PROCEDURE: 10/16/2024  PREOPERATIVE DIAGNOSIS:  Right knee osteoarthritis.  POSTOPERATIVE DIAGNOSIS:  Right knee osteoarthritis.  PROCEDURE:  Right total knee replacement using Persona press-fit cruciate retaining size 6 femur, size E press-fit tibial tray with 10 mm medial congruent polyethylene liner, and 35 mm three-peg patella.  SURGEON:  Cordella RAMAN. Addie, MD  ASSISTANT:  Herlene Calix, PA  INDICATIONS:  This is a 58 year old patient with end-stage arthritis in the right knee who presents for operative management after failure of conservative management.  DESCRIPTION OF PROCEDURE:  The patient was brought to the operating room where general anesthetic was induced.  Preoperative antibiotics were administered.  A timeout was called.  The right leg was prescrubbed with alcohol and Betadine , allowed to air  dry, prepped with DuraPrep solution, and draped in a sterile manner.  The patient had about a 7-8 degree flexion contracture in the right knee.  Did have a good flexion.  The right knee was then prepped with DuraPrep solution and draped in a sterile  manner.  Ioban was used to cover the operative field.  After calling timeout, the leg was elevated and exsanguinated with an Esmarch wrap.  Tourniquet was inflated.  Total tourniquet time 60 minutes at 300 mmHg.  An anterior approach to the knee was  made.  Skin and subcutaneous tissue were sharply divided.  IrriSept solution utilized.  Medial parapatellar arthrotomy was made and marked with a number 1 Vicryl suture.  IrriSept solution utilized again in the arthrotomy.  Fat pad partially excised.   Medial soft tissue dissection was performed proportional to the patient's mild preoperative varus deformity.  The lateral patellofemoral ligament was  released.  Soft tissue was removed from the anterodistal femur.  Bleeding points encountered along the  way were controlled using electrocautery.  The patella was everted, the knee was flexed, the anterior horn lateral meniscus was released, and the ACL was released and resected.  A posterior retractor was placed and the PCL was recessed.  With the collateral ligaments protected and the PCL protected, intramedullary alignment was used to make a cut of 10 mm off the least affected lateral tibial plateau.  That corresponded to a 3 mm cut on the most affected medial tibial plateau.  This was  perpendicular to the mechanical axis in the coronal plane.  Osteophytes had also been removed earlier.  Next, intramedullary alignment was then used to cut the distal femur in 5 degrees of valgus.  A 10 mm resection was made.  Bone quality was excellent.   A 10 mm spacer gave symmetric extension gap with good balance and good alignment.  The femur was sized to a size 6.  Anterior posterior and chamfer cuts were then made in 3 degrees of external rotation which gave symmetric flexion and extension gaps.   Next, the tibial baseplate was placed, aligned with the medial third of the tibial tubercle.  Screws were placed to hold that baseplate in position.  The anterior, posterior and chamfer cuts were made on the femur, and a trial femur was placed.  A 10 mm  spacer was then placed.  The patella was then cut down from 22 to 11.5 mm, and a three-peg patella trial was placed.  With trial components in position, the patient had full extension, full flexion with no elevation of  the liner with deep flexion.  The  patella had excellent tracking with no thumbs technique.  Final preparations were made on both the femur and the tibia.  Trial components were then removed.  Thorough irrigation x3 liters of pulsatile irrigation was performed.  We then let a TXA sponge and IrriSept solution sit in the incision for 3 minutes.  After that  was completed, we tapped in the tibial baseplate with very good press-fit obtained.  Then we placed the 10 mm polyethylene true spacer and then tapped in the  femur with also excellent press-fit obtained.  The patella was then placed with excellent press-fit.  The knee was taken through a range of motion and found to have the same stability parameters with no liftoff and excellent stability to varus valgus  stress at 0, 30, and 90 degrees with excellent patellar tracking using no thumbs technique.  The tourniquet was released at this time.  Bleeding points encountered were controlled using electrocautery.  3 liters of irrigating solution was utilized in a  pouring manner.  Then the arthrotomy was closed over bolster using #1 Vicryl suture.  Prior to final arthrotomy closure, we did irrigate the knee out one more time using IrriSept solution.  We suctioned that out, placed in some vancomycin  powder, and  then closed up the arthrotomy.  It should also be noted that we irrigated out the tibial canal prior to placement of the implant with IrriSept solution and then placed in some vancomycin  powder.  Next, IrriSept solution was used on top of the arthrotomy  along with vancomycin  powder, and then the skin was closed using 0 Vicryl suture, 2-0 Vicryl suture, and 3-0 Monocryl with Steri-Strips and Aquacel dressing placed along with Ace wrap and knee immobilizer.  The patient was transferred to the recovery  room in stable condition.  Luke's assistance was required at all times for retraction, opening, closing, and mobilization of tissue.  His assistance was of medical necessity.   PUS D: 10/16/2024 1:09:49 pm T: 10/16/2024 1:21:00 pm  JOB: [id number]/ [id number]

## 2024-10-16 NOTE — Anesthesia Procedure Notes (Signed)
 Procedure Name: Intubation Date/Time: 10/16/2024 10:50 AM  Performed by: Delores Dus, CRNAPre-anesthesia Checklist: Patient identified, Emergency Drugs available, Suction available and Patient being monitored Patient Re-evaluated:Patient Re-evaluated prior to induction Oxygen Delivery Method: Circle system utilized Preoxygenation: Pre-oxygenation with 100% oxygen Induction Type: IV induction Ventilation: Mask ventilation without difficulty Laryngoscope Size: Miller and 2 Grade View: Grade I Tube type: Oral Tube size: 7.0 mm Number of attempts: 1 Airway Equipment and Method: Stylet and Oral airway Placement Confirmation: ETT inserted through vocal cords under direct vision, positive ETCO2 and breath sounds checked- equal and bilateral Secured at: 22 cm Tube secured with: Tape Dental Injury: Teeth and Oropharynx as per pre-operative assessment

## 2024-10-16 NOTE — Anesthesia Procedure Notes (Signed)
 Anesthesia Regional Block: Adductor canal block   Pre-Anesthetic Checklist: , timeout performed,  Correct Patient, Correct Site, Correct Laterality,  Correct Procedure, Correct Position, site marked,  Risks and benefits discussed,  Surgical consent,  Pre-op evaluation,  At surgeon's request and post-op pain management  Laterality: Lower and Right  Prep: chloraprep       Needles:  Injection technique: Single-shot  Needle Type: Echogenic Stimulator Needle     Needle Length: 10cm  Needle Gauge: 20     Additional Needles:   Procedures:,,,, ultrasound used (permanent image in chart),, #20gu IV placed    Narrative:  Start time: 10/16/2024 9:30 AM End time: 10/16/2024 9:30 AM Injection made incrementally with aspirations every 5 mL.  Performed by: Personally  Anesthesiologist: Colhoun, Lauraine DASEN, MD  Additional Notes: Timeout performed with bedside RN - Name, DOB, allergies and laterality confirmed by the patient and RN. Surgical marking performed/confirmed. Anticoagulation status and most recent platelet count reviewed. Patient placed in a frog-leg position and sedation (as documented by RN) given via PIV. Peripheral nerve block performed as documented above. VSS throughout (see Flowchart).

## 2024-10-16 NOTE — Progress Notes (Addendum)
 Orthopedic Tech Progress Note Patient Details:  Laura [name] [birth year] [id number] Cpm applied 10-40 degrees Ortho Devices Type of Ortho Device: Bone foam zero knee Ortho Device/Splint Location: RLE Ortho Device/Splint Interventions: Adjustment, Application, Ordered   Post Interventions Patient Tolerated: Well  Laura [name] Laura [name] 10/16/2024, 3:37 PM

## 2024-10-16 NOTE — Evaluation (Signed)
 Physical Therapy Evaluation Patient Details Name: Laura [name] MRN: [id number] DOB: December 09, 1965 Today's Date: 10/16/2024  History of Present Illness  58 y.o. female presents to [facility] hospital on 10/16/2024 for elective R TKA. PMH includes hypothyroidism, HTN, HLD, fibromyalgia, GAD, CKD, depression.  Clinical Impression  Pt presents to PT with deficits in functional mobility, gait, balance, strength, ROM. Pt is limited by reports of dizziness when standing, ambulating a very short distance prior to returning to bed. Pt did receive reglan  and pain medication prior to session, possibly contributing to symptoms. Education deferred on TKR exercise packet as pt is groggy after completing mobility. PT will follow up tomorrow for a progression of gait and to initiate stair training.         If plan is discharge home, recommend the following: A little help with walking and/or transfers;A little help with bathing/dressing/bathroom;Assistance with cooking/housework;Assist for transportation;Help with stairs or ramp for entrance   Can travel by private vehicle        Equipment Recommendations Rolling walker (2 wheels);BSC/3in1  Recommendations for Other Services       Functional Status Assessment Patient has had a recent decline in their functional status and demonstrates the ability to make significant improvements in function in a reasonable and predictable amount of time.     Precautions / Restrictions Precautions Precautions: Fall;Knee Precaution Booklet Issued: Yes (comment) Recall of Precautions/Restrictions: Intact Required Braces or Orthoses: Knee Immobilizer - Right Knee Immobilizer - Right: On when out of bed or walking Restrictions Weight Bearing Restrictions Per Provider Order: Yes RLE Weight Bearing Per Provider Order: Weight bearing as tolerated      Mobility  Bed Mobility Overal bed mobility: Needs Assistance Bed Mobility: Supine to Sit, Sit to Supine     Supine to  sit: Contact guard Sit to supine: Min assist        Transfers Overall transfer level: Needs assistance Equipment used: Rolling walker (2 wheels) Transfers: Sit to/from Stand Sit to Stand: Contact guard assist                Ambulation/Gait Ambulation/Gait assistance: Contact guard assist Gait Distance (Feet): 4 Feet Assistive device: Rolling walker (2 wheels) Gait Pattern/deviations: Step-to pattern Gait velocity: reduced Gait velocity interpretation: <1.31 ft/sec, indicative of household ambulator   General Gait Details: slowed step-to gait, reduced stance time on RLE, distance limited by reports of dizziness  Stairs            Wheelchair Mobility     Tilt Bed    Modified Rankin (Stroke Patients Only)       Balance Overall balance assessment: Needs assistance Sitting-balance support: No upper extremity supported, Feet supported Sitting balance-Leahy Scale: Good     Standing balance support: Bilateral upper extremity supported, Reliant on assistive device for balance Standing balance-Leahy Scale: Poor                               Pertinent Vitals/Pain Pain Assessment Pain Assessment: 0-10 Pain Score: 2  Pain Location: R knee Pain Descriptors / Indicators: Aching Pain Intervention(s): Monitored during session    Home Living Family/patient expects to be discharged to:: Private residence Living Arrangements: Alone Available Help at Discharge: Family Type of Home: House Home Access: Stairs to enter Entrance Stairs-Rails: None Entrance Stairs-Number of Steps: 2   Home Layout: One level Home Equipment: None      Prior Function Prior Level of Function : Independent/Modified Independent;Driving;Working/employed  Extremity/Trunk Assessment   Upper Extremity Assessment Upper Extremity Assessment: Overall WFL for tasks assessed    Lower Extremity Assessment Lower Extremity Assessment: RLE  deficits/detail RLE Deficits / Details: generalized post-op weakness and ROM deficits as anticipated on POD 0    Cervical / Trunk Assessment Cervical / Trunk Assessment: Normal  Communication   Communication Communication: No apparent difficulties    Cognition Arousal: Alert Behavior During Therapy: WFL for tasks assessed/performed   PT - Cognitive impairments: No apparent impairments                         Following commands: Intact       Cueing Cueing Techniques: Verbal cues     General Comments General comments (skin integrity, edema, etc.): BP 121/68 in supine after mobility, pt was reporting dizziness when standing and ambulating, did recently receive pain medications and reglan  prior to session    Exercises     Assessment/Plan    PT Assessment Patient needs continued PT services  PT Problem List Decreased strength;Decreased range of motion;Decreased activity tolerance;Decreased mobility;Decreased balance;Decreased knowledge of use of DME;Pain       PT Treatment Interventions Gait training;DME instruction;Stair training;Functional mobility training;Therapeutic activities;Therapeutic exercise;Balance training;Neuromuscular re-education;Patient/family education    PT Goals (Current goals can be found in the Care Plan section)  Acute Rehab PT Goals Patient Stated Goal: to return to independence PT Goal Formulation: With patient/family Time For Goal Achievement: 10/20/24 Potential to Achieve Goals: Fair    Frequency 7X/week     Co-evaluation               AM-PAC PT 6 Clicks Mobility  Outcome Measure Help needed turning from your back to your side while in a flat bed without using bedrails?: A Little Help needed moving from lying on your back to sitting on the side of a flat bed without using bedrails?: A Little Help needed moving to and from a bed to a chair (including a wheelchair)?: A Little Help needed standing up from a chair using your  arms (e.g., wheelchair or bedside chair)?: A Little Help needed to walk in hospital room?: A Lot Help needed climbing 3-5 steps with a railing? : Total 6 Click Score: 15    End of Session Equipment Utilized During Treatment: Gait belt;Right knee immobilizer Activity Tolerance: Treatment limited secondary to medical complications (Comment) (limited by dizziness) Patient left: in bed;with call bell/phone within reach;with family/visitor present;with nursing/sitter in room Nurse Communication: Mobility status PT Visit Diagnosis: Other abnormalities of gait and mobility (R26.89);Muscle weakness (generalized) (M62.81);Pain Pain - Right/Left: Right Pain - part of body: Knee    Time: 8290-8264 PT Time Calculation (min) (ACUTE ONLY): 26 min   Charges:   PT Evaluation $PT Eval Low Complexity: 1 Low   PT General Charges $$ ACUTE PT VISIT: 1 Visit         Bernardino JINNY Ruth, PT, DPT Acute Rehabilitation Office [phone]   Bernardino JINNY Ruth 10/16/2024, 5:51 PM
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
## 2024-10-16 NOTE — Transfer of Care (Signed)
 Immediate Anesthesia Transfer of Care Note  Patient: Laura [name]  Procedure(s) Performed: ARTHROPLASTY, KNEE, TOTAL (Right: Knee)  Patient Location: PACU  Anesthesia Type:General  Level of Consciousness: awake and sedated  Airway & Oxygen Therapy: Patient Spontanous Breathing and Patient connected to face mask oxygen  Post-op Assessment: Report given to RN and Post -op Vital signs reviewed and stable  Post vital signs: Reviewed and stable  Last Vitals:  Vitals Value Taken Time  BP 135/82 10/16/24 13:15  Temp 36.7 C 10/16/24 13:15  Pulse 78 10/16/24 13:19  Resp 9 10/16/24 13:19  SpO2 96 % 10/16/24 13:19  Vitals shown include unfiled device data.  Last Pain:  Vitals:   10/16/24 0918  TempSrc:   PainSc: 4          Complications: No notable events documented.

## 2024-10-16 NOTE — Brief Op Note (Signed)
° °  10/16/2024  1:02 PM  PATIENT:  Irem A Molitor  58 y.o. female  PRE-OPERATIVE DIAGNOSIS:  right knee osteoarthritis  POST-OPERATIVE DIAGNOSIS:  right knee osteoarthritis  PROCEDURE:  Procedures: ARTHROPLASTY, KNEE, TOTAL  SURGEON:  Surgeon(s): Addie, Cordella Hamilton, MD  ASSISTANT: Magnant PA  ANESTHESIA:   General  EBL: 50 ml    Total I/O In: 1000 [I.V.:900; IV Piggyback:100] Out: 50 [Blood:50]  BLOOD ADMINISTERED: none  DRAINS: None  LOCAL MEDICATIONS USED: Marcaine  morphine  clonidine  Exparel  vancomycin  powder  SPECIMEN:  No Specimen  COUNTS:  YES  TOURNIQUET:   Total Tourniquet Time Documented: Thigh (Right) - 60 minutes Total: Thigh (Right) - 60 minutes   DICTATION: .Other Dictation: Dictation Number [id number]  PLAN OF CARE: Admit for overnight observation  PATIENT DISPOSITION:  PACU - hemodynamically stable

## 2024-10-17 ENCOUNTER — Encounter: Payer: Self-pay | Admitting: Orthopedic Surgery

## 2024-10-17 ENCOUNTER — Encounter (HOSPITAL_COMMUNITY): Payer: Self-pay | Admitting: Orthopedic Surgery

## 2024-10-17 ENCOUNTER — Telehealth: Payer: Self-pay

## 2024-10-17 LAB — BASIC METABOLIC PANEL WITH GFR
Anion gap: 16 — ABNORMAL HIGH (ref 5–15)
BUN: 17 mg/dL (ref 6–20)
CO2: 24 mmol/L (ref 22–32)
Calcium: 9.2 mg/dL (ref 8.9–10.3)
Chloride: 96 mmol/L — ABNORMAL LOW (ref 98–111)
Creatinine, Ser: 0.75 mg/dL (ref 0.44–1.00)
GFR, Estimated: >60 mL/min (ref >60)
Glucose, Bld: 161 mg/dL — ABNORMAL HIGH (ref 70–99)
Potassium: 3.3 mmol/L — ABNORMAL LOW (ref 3.5–5.1)
Sodium: 137 mmol/L (ref 135–145)

## 2024-10-17 MED ORDER — POTASSIUM CHLORIDE 20 MEQ PO PACK
20.0000 meq | PACK | Freq: Two times a day (BID) | ORAL | Status: DC
  Administered 2024-10-17: 18:00:00 20 meq via ORAL
  Filled 2024-10-17: qty 1

## 2024-10-17 MED ORDER — ASPIRIN 81 MG PO CHEW: 81.0000 mg | CHEWABLE_TABLET | Freq: Two times a day (BID) | ORAL | 0 refills | Status: DC

## 2024-10-17 MED ORDER — CALCIUM CARBONATE ANTACID 500 MG PO CHEW
1.0000 | CHEWABLE_TABLET | Freq: Once | ORAL | Status: AC
  Administered 2024-10-17: 02:00:00 200 mg via ORAL
  Filled 2024-10-17: qty 1

## 2024-10-17 MED ORDER — FAMOTIDINE 20 MG PO TABS
20.0000 mg | ORAL_TABLET | Freq: Two times a day (BID) | ORAL | Status: DC
  Administered 2024-10-19: 20 mg via ORAL
  Filled 2024-10-17 (×7): qty 1

## 2024-10-17 MED ORDER — ACETAMINOPHEN 325 MG PO TABS: 325.0000 mg | ORAL_TABLET | Freq: Four times a day (QID) | ORAL | 0 refills | Status: DC | PRN

## 2024-10-17 MED ORDER — DOCUSATE SODIUM 100 MG PO CAPS: 100.0000 mg | ORAL_CAPSULE | Freq: Two times a day (BID) | ORAL | 0 refills | Status: AC

## 2024-10-17 MED ORDER — ALUM & MAG HYDROXIDE-SIMETH 200-200-20 MG/5ML PO SUSP
30.0000 mL | Freq: Four times a day (QID) | ORAL | Status: DC | PRN
  Administered 2024-10-17: 13:00:00 30 mL via ORAL
  Filled 2024-10-17: qty 30

## 2024-10-17 MED ORDER — NAPROXEN 250 MG PO TABS: 250.0000 mg | ORAL_TABLET | Freq: Two times a day (BID) | ORAL | 0 refills | Status: DC

## 2024-10-17 MED ORDER — HYDROMORPHONE HCL 4 MG PO TABS: 4.0000 mg | ORAL_TABLET | ORAL | 0 refills | Status: DC | PRN

## 2024-10-17 NOTE — Plan of Care (Signed)
   Problem: Education: Goal: Knowledge of General Education information will improve Description: Including pain rating scale, medication(s)/side effects and non-pharmacologic comfort measures Outcome: Progressing   Problem: Activity: Goal: Risk for activity intolerance will decrease Outcome: Progressing   Problem: Nutrition: Goal: Adequate nutrition will be maintained Outcome: Progressing   Problem: Coping: Goal: Level of anxiety will decrease Outcome: Progressing

## 2024-10-17 NOTE — Progress Notes (Signed)
 Physical Therapy Treatment Patient Details Name: Laura [name] MRN: [id number] DOB: [birth year] Today's Date: 10/17/2024   History of Present Illness 58 y.o. female presents to [facility] hospital on 10/16/2024 for elective R TKA. PMH includes hypothyroidism, HTN, HLD, fibromyalgia, GAD, CKD, depression.    PT Comments  Pt received in supine, agreeable to therapy session after premedication in AM with IV pain meds, although pt reports pain still severe with transfers, RN notified and arrived to give more IV pain meds during session. Pt reports only mild dizziness with standing this session and able to progress to short household distance gait trial with RW support and up to CGA, greatly increased time to perform with standing breaks of 1-2 minutes every ~70ft. Pt needing up to minA for bed mobility with KI donned and min safety cues for most transfers for improved body mechanics. Pt making slow progress toward goals, anticipate need for at least 1-2 more PT sessions prior to DC, due to pain/high fall risk.     If plan is discharge home, recommend the following: A little help with walking and/or transfers;A little help with bathing/dressing/bathroom;Assistance with cooking/housework;Assist for transportation;Help with stairs or ramp for entrance   Can travel by private vehicle        Equipment Recommendations  Rolling walker (2 wheels);BSC/3in1    Recommendations for Other Services       Precautions / Restrictions Precautions Precautions: Fall;Knee Precaution Booklet Issued: Yes (comment) Recall of Precautions/Restrictions: Intact Required Braces or Orthoses: Knee Immobilizer - Right Knee Immobilizer - Right: On when out of bed or walking Restrictions Weight Bearing Restrictions Per Provider Order: Yes RLE Weight Bearing Per Provider Order: Weight bearing as tolerated     Mobility  Bed Mobility Overal bed mobility: Needs Assistance Bed Mobility: Supine to Sit     Supine to sit: Min  assist, Used rails     General bed mobility comments: Assist with RLE to get to EOB    Transfers Overall transfer level: Needs assistance Equipment used: Rolling walker (2 wheels) Transfers: Sit to/from Stand Sit to Stand: Contact guard assist           General transfer comment: from EOB    Ambulation/Gait Ambulation/Gait assistance: Contact guard assist Gait Distance (Feet): 30 Feet (60ft to bathroom, then 20ft, then 65ft) Assistive device: Rolling walker (2 wheels) Gait Pattern/deviations: Step-to pattern, Decreased step length - left, Decreased weight shift to right Gait velocity: reduced Gait velocity interpretation: <1.31 ft/sec, indicative of household ambulator   General Gait Details: slowed step-to gait, reduced weight shift to RLE, distance limited by reports of dizziness, pain 8/10, fatigue; many brief standing breaks to achieve less than household distances; SpO2 WFL on RA, HR 80's bpm   Stairs Stairs:  (defer, pain too severe in AM)           Wheelchair Mobility     Tilt Bed    Modified Rankin (Stroke Patients Only)       Balance Overall balance assessment: Needs assistance Sitting-balance support: No upper extremity supported, Feet supported Sitting balance-Leahy Scale: Good     Standing balance support: Bilateral upper extremity supported, Reliant on assistive device for balance Standing balance-Leahy Scale: Poor Standing balance comment: RW for dynamic tasks, CGA for static standing to pull up pants after toileting                            Communication Communication Communication: No apparent difficulties  Cognition  Arousal: Alert Behavior During Therapy: WFL for tasks assessed/performed   PT - Cognitive impairments: No apparent impairments                       PT - Cognition Comments: A&O, pt is curious and appropriately aware of safety risks. Following commands: Intact      Cueing Cueing Techniques:  Verbal cues, Gestural cues  Exercises Total Joint Exercises Ankle Circles/Pumps: AROM, Both, 5 reps, Supine Quad Sets:  (pt reports compliance with quad sets in AM) Long Arc Quad:  (verbal review with pt agreeable to perform in chair after eating lunch and after pain meds/ice has time to kick in)    General Comments General comments (skin integrity, edema, etc.): no acute s/sx distress other than c/o pain and mild c/o dizziness throughout standing, does not increase however and pt tolerates standing >10 mins during gait trial, SpO2/HR WFL on RA      Pertinent Vitals/Pain Pain Assessment Pain Assessment: 0-10 Pain Score: 8  Pain Location: R knee Pain Descriptors / Indicators: Aching, Constant, Burning, Grimacing, Operative site guarding Pain Intervention(s): Limited activity within patient's tolerance, Monitored during session, Premedicated before session, Repositioned, Patient requesting pain meds-RN notified, RN gave pain meds during session, Ice applied (iceman refilled by PTA at end of session)    Home Living                          Prior Function            PT Goals (current goals can now be found in the care plan section) Acute Rehab PT Goals Patient Stated Goal: to return to independence PT Goal Formulation: With patient/family Time For Goal Achievement: 10/20/24 Progress towards PT goals: Progressing toward goals    Frequency    7X/week      PT Plan      Co-evaluation              AM-PAC PT 6 Clicks Mobility   Outcome Measure  Help needed turning from your back to your side while in a flat bed without using bedrails?: A Little Help needed moving from lying on your back to sitting on the side of a flat bed without using bedrails?: A Little Help needed moving to and from a bed to a chair (including a wheelchair)?: A Little Help needed standing up from a chair using your arms (e.g., wheelchair or bedside chair)?: A Little Help needed to walk  in hospital room?: A Little Help needed climbing 3-5 steps with a railing? : Total 6 Click Score: 16    End of Session Equipment Utilized During Treatment: Gait belt;Right knee immobilizer Activity Tolerance: Patient limited by pain Patient left: with call bell/phone within reach;in chair;with chair alarm set;Other (comment) (iceman refilled/donned, pt set up with tray table to eat her lunch) Nurse Communication: Mobility status;Patient requests pain meds;Precautions;Weight bearing status (board updated to include need to wear KI for pivoting OOB) PT Visit Diagnosis: Other abnormalities of gait and mobility (R26.89);Muscle weakness (generalized) (M62.81);Pain Pain - Right/Left: Right Pain - part of body: Knee     Time: 1032-1121 PT Time Calculation (min) (ACUTE ONLY): 49 min  Charges:    $Gait Training: 23-37 mins $Therapeutic Activity: 8-22 mins PT General Charges $$ ACUTE PT VISIT: 1 Visit                     Rebacca Votaw P., PTA Acute Rehabilitation  Services Secure Chat Preferred 9a-5:30pm Office: [phone]    Laura [name] 10/17/2024, 11:33 AM

## 2024-10-17 NOTE — Plan of Care (Signed)
°  Problem: Clinical Measurements: Goal: Will remain free from infection Outcome: Progressing Goal: Cardiovascular complication will be avoided Outcome: Progressing   Problem: Activity: Goal: Risk for activity intolerance will decrease Outcome: Progressing   Problem: Coping: Goal: Level of anxiety will decrease Outcome: Progressing   Problem: Activity: Goal: Ability to avoid complications of mobility impairment will improve Outcome: Progressing

## 2024-10-17 NOTE — Care Management Obs Status (Signed)
 MEDICARE OBSERVATION STATUS NOTIFICATION   Patient Details  Name: Laura [name] MRN: [id number] Date of Birth: 1966-01-03   Medicare Observation Status Notification Given:  Yes    Jennie Laneta Dragon 10/17/2024, 2:29 PM

## 2024-10-17 NOTE — Progress Notes (Signed)
° °  Brief Progress Note   _____________________________________________________________________________________________________________  Patient Name: Laura [name] Patient DOB: 1966/04/02 Date: @TODAY @   No barriers noted at this time____________________________________________________________________________________________  [facility] RN Expeditor Ronal DELENA Bald Please contact us  directly via secure chat (search for [facility]) or by calling us  at [phone] [facility]).

## 2024-10-17 NOTE — Telephone Encounter (Signed)
 Geralyn, nurse at [facility] [address] called stating that patient is asking for heartburn medicine.  CB# [phone].  Please advise.  Thank you.

## 2024-10-17 NOTE — Progress Notes (Signed)
 Physical Therapy Treatment Patient Details Name: Laura [name] MRN: [id number] DOB: April 24, 1966 Today's Date: 10/17/2024   History of Present Illness 58 y.o. female presents to [facility] hospital on 10/16/2024 for elective R TKA. PMH includes hypothyroidism, HTN, HLD, fibromyalgia, GAD, CKD, depression.    PT Comments  Pt received in supine after RN premedication with PO Tylenol , pt requesting assist to bathroom, standing/gait tolerance limited due to pt increased pain, with shivering/tearfulness as session progressed. Pt needing up to CGA for safety with transfers/gait and curb height step x2 reps in room with RW support. Pt more limited in PM session due to pain and not able to perform typical household distance gait trial, RN/MD notified pt also not able to tolerate CPM machine or bone foam, although PTA did attempt to have her rest RLE on bone foam, but pt became more tearful, so it was removed. Pt defers iceman at time of session due to fibromyalgia pain increased today with cold temps, pt agreeable for staff to place it back on later in the day. Review of supine/seated RLE exercises with pt able to tolerate slightly more R knee flexion this afternoon, but still <50 deg flexion and pt unable to achieve R TKE due to pain. Pt continues to benefit from PT services to progress toward functional mobility goals, disposition defer to MD although pt reports she is not ready yet today to go home.     If plan is discharge home, recommend the following: A little help with walking and/or transfers;A little help with bathing/dressing/bathroom;Assistance with cooking/housework;Assist for transportation;Help with stairs or ramp for entrance   Can travel by private vehicle        Equipment Recommendations  Rolling walker (2 wheels);BSC/3in1    Recommendations for Other Services       Precautions / Restrictions Precautions Precautions: Fall;Knee Precaution Booklet Issued: Yes (comment) Recall of  Precautions/Restrictions: Intact Required Braces or Orthoses: Knee Immobilizer - Right Knee Immobilizer - Right: On when out of bed or walking Restrictions Weight Bearing Restrictions Per Provider Order: Yes RLE Weight Bearing Per Provider Order: Weight bearing as tolerated     Mobility  Bed Mobility Overal bed mobility: Needs Assistance Bed Mobility: Supine to Sit, Sit to Supine     Supine to sit: Used rails, Supervision, HOB elevated Sit to supine: Supervision, Used rails   General bed mobility comments: no LE assist needed in PM session, pt had KI donned by PTA prior to initiating to EOB    Transfers Overall transfer level: Needs assistance Equipment used: Rolling walker (2 wheels) Transfers: Sit to/from Stand Sit to Stand: Supervision, Contact guard assist           General transfer comment: from EOB and to/from low toilet surface with use of L wall rail, then back to EOB; initial cues for LE placement/UE placement with good carryover from AM session despite pt increased pain.    Ambulation/Gait Ambulation/Gait assistance: Contact guard assist Gait Distance (Feet): 15 Feet (x2 with seated break on toilet) Assistive device: Rolling walker (2 wheels) Gait Pattern/deviations: Step-to pattern, Decreased step length - left, Decreased weight shift to right Gait velocity: reduced Gait velocity interpretation: <1.8 ft/sec, indicate of risk for recurrent falls   General Gait Details: slowed step-to gait, reduced weight shift to RLE, distance limited by pt c/o severe pain and full-body shiver as standing time increased, pt reports this is due to pain, she does not feel overly cold, and in fact her face feels hot and red. RN/MD  notified pt wants more info on when to request what meds to better control her pain. PTA did see her after Tylenol  had time to take effect in PM and had IV pain meds in AM but pt reports pain is more severe in PM. CGA for safety due to pain score and pt  shivering but no c/o dizziness in PM session.   Stairs Stairs: Yes Stairs assistance: Contact guard assist Stair Management: Step to pattern, Forwards, With walker Number of Stairs: 2 General stair comments: single 4 step x2 reps with BUE supported by RW and cues for step-to pattern ascending and stepping backward to descend. Pt reports 2-3 STE home, defers third rep due to increased c/o pain and pt shivering/tearful, returned to EOB after performing step in room.   Wheelchair Mobility     Tilt Bed    Modified Rankin (Stroke Patients Only)       Balance Overall balance assessment: Needs assistance Sitting-balance support: No upper extremity supported, Feet supported Sitting balance-Leahy Scale: Good     Standing balance support: Bilateral upper extremity supported, Reliant on assistive device for balance Standing balance-Leahy Scale: Poor Standing balance comment: RW for dynamic tasks, CGA for static standing unsupported to pull up pants after toileting                            Communication Communication Communication: No apparent difficulties  Cognition Arousal: Alert Behavior During Therapy: WFL for tasks assessed/performed   PT - Cognitive impairments: No apparent impairments                       PT - Cognition Comments: A&O, good safety awareness, distracted slightly in PM due to pain Following commands: Intact      Cueing Cueing Techniques: Verbal cues, Gestural cues  Exercises Total Joint Exercises Ankle Circles/Pumps: AROM, Both, Supine, 10 reps Quad Sets: AROM, Right, 5 reps, Supine (with rest breaks between to recover) Gluteal Sets: AROM, 5 reps, Supine (cues for technique and 3-5 sec hold, pursed-lip breathing) Heel Slides: AROM, Right, 5 reps, Supine, Limitations (with rest breaks between, x5 total during session including before/after ambulation) Heel Slides Limitations: pain/shivering due to pain Straight Leg Raises: AAROM,  Right, 5 reps, Supine (AA due to pain) Long Arc Quad:  (pt defers due to pain, able to perform heel slides) Goniometric ROM: not formally assessed due to pain and pt urgency to get to bathroom, R knee flexion ROM grossly 15 deg to 40 deg in supine with AA, pain limiting R knee extension/flexion    General Comments General comments (skin integrity, edema, etc.): RN/MD notified pt reports pain more severe in PM session, pt shivering/tearful. SpO2/HR WFL on RA.      Pertinent Vitals/Pain Pain Assessment Pain Assessment: 0-10 Pain Score: 9  Pain Location: R knee Pain Descriptors / Indicators: Aching, Constant, Burning, Grimacing, Operative site guarding, Other (Comment), Crying (shivering/shaking as pain increased) Pain Intervention(s): Limited activity within patient's tolerance, Monitored during session, Premedicated before session, Repositioned, Patient requesting pain meds-RN notified, Other (comment) (pt defers iceman due to shivering/shaking from pain and fibromyalgia)    Home Living                          Prior Function            PT Goals (current goals can now be found in the care plan section) Acute Rehab  PT Goals Patient Stated Goal: to return to independence PT Goal Formulation: With patient/family Time For Goal Achievement: 10/20/24 Progress towards PT goals: Progressing toward goals (slowly -pain)    Frequency    7X/week      PT Plan      Co-evaluation              AM-PAC PT 6 Clicks Mobility   Outcome Measure  Help needed turning from your back to your side while in a flat bed without using bedrails?: A Little Help needed moving from lying on your back to sitting on the side of a flat bed without using bedrails?: A Little Help needed moving to and from a bed to a chair (including a wheelchair)?: A Little Help needed standing up from a chair using your arms (e.g., wheelchair or bedside chair)?: A Little Help needed to walk in hospital  room?: A Little Help needed climbing 3-5 steps with a railing? : A Little (performs x2 today) 6 Click Score: 18    End of Session Equipment Utilized During Treatment: Gait belt;Right knee immobilizer Activity Tolerance: Patient limited by pain Patient left: with call bell/phone within reach;Other (comment);in bed;with bed alarm set (pt defers iceman -pain, tried bone foam but crying so did not tolerate >1 minute) Nurse Communication: Mobility status;Patient requests pain meds;Precautions;Weight bearing status (board updated to include need to wear KI for pivoting OOB, pt request all staff use gait belt, pt provided with gait belt for room/home) PT Visit Diagnosis: Other abnormalities of gait and mobility (R26.89);Muscle weakness (generalized) (M62.81);Pain Pain - Right/Left: Right Pain - part of body: Knee     Time: 8660-8597 PT Time Calculation (min) (ACUTE ONLY): 23 min  Charges:    $Gait Training: 8-22 mins $Therapeutic Exercise: 8-22 mins PT General Charges $$ ACUTE PT VISIT: 1 Visit                     Korey Prashad P., PTA Acute Rehabilitation Services Secure Chat Preferred 9a-5:30pm Office: [phone]    Connell HERO [facility] 10/17/2024, 2:45 PM

## 2024-10-17 NOTE — Progress Notes (Signed)
°  Subjective: Patient stable.  Pain controlled this morning.  Can almost do a straight leg raise.   Objective: Vital signs in last 24 hours: Temp:  [97.7 F (36.5 C)-98.6 F (37 C)] 98.4 F (36.9 C) (12/17 0437) Pulse Rate:  [68-83] 83 (12/17 0437) Resp:  [9-20] 17 (12/17 0437) BP: (107-143)/(60-89) 107/60 (12/17 0437) SpO2:  [92 %-97 %] 95 % (12/17 0437) Weight:  [82.6 kg] 82.6 kg (12/16 0722)  Intake/Output from previous day: 12/16 0701 - 12/17 0700 In: 1560 [P.O.:460; I.V.:900; IV Piggyback:200] Out: 400 [Urine:350; Blood:50] Intake/Output this shift: No intake/output data recorded.  Exam:  Sensation intact distally Dorsiflexion/Plantar flexion intact  Labs: No results for input(s): HGB in the last 72 hours. No results for input(s): WBC, RBC, HCT, PLT in the last 72 hours. Recent Labs    10/15/24 0845 10/17/24 0525  NA 143 137  K 3.8 3.3*  CL 96 96*  CO2 35* 24  BUN 20 17  CREATININE 0.72 0.75  GLUCOSE 142* 161*  CALCIUM  10.6* 9.2   No results for input(s): LABPT, INR in the last 72 hours.  Assessment/Plan: Plan at this time is to see how Azie does with physical therapy.  If she makes good progress then she could go home this afternoon.  Currently pain is well-controlled but I think the blocks are still in effect.   G Scott Annaya Bangert 10/17/2024, 7:21 AM

## 2024-10-17 NOTE — Telephone Encounter (Signed)
 Order placed for Pepcid 

## 2024-10-18 DIAGNOSIS — Z833 Family history of diabetes mellitus: Secondary | ICD-10-CM | POA: Diagnosis not present

## 2024-10-18 DIAGNOSIS — Y92239 Unspecified place in hospital as the place of occurrence of the external cause: Secondary | ICD-10-CM | POA: Diagnosis not present

## 2024-10-18 DIAGNOSIS — J4520 Mild intermittent asthma, uncomplicated: Secondary | ICD-10-CM | POA: Diagnosis present

## 2024-10-18 DIAGNOSIS — E063 Autoimmune thyroiditis: Secondary | ICD-10-CM | POA: Diagnosis present

## 2024-10-18 DIAGNOSIS — I2510 Atherosclerotic heart disease of native coronary artery without angina pectoris: Secondary | ICD-10-CM | POA: Diagnosis present

## 2024-10-18 DIAGNOSIS — Z7982 Long term (current) use of aspirin: Secondary | ICD-10-CM | POA: Diagnosis not present

## 2024-10-18 DIAGNOSIS — Z7985 Long-term (current) use of injectable non-insulin antidiabetic drugs: Secondary | ICD-10-CM | POA: Diagnosis not present

## 2024-10-18 DIAGNOSIS — Z87891 Personal history of nicotine dependence: Secondary | ICD-10-CM | POA: Diagnosis not present

## 2024-10-18 DIAGNOSIS — Z888 Allergy status to other drugs, medicaments and biological substances status: Secondary | ICD-10-CM | POA: Diagnosis not present

## 2024-10-18 DIAGNOSIS — E782 Mixed hyperlipidemia: Secondary | ICD-10-CM | POA: Diagnosis present

## 2024-10-18 DIAGNOSIS — M797 Fibromyalgia: Secondary | ICD-10-CM | POA: Diagnosis present

## 2024-10-18 DIAGNOSIS — Z885 Allergy status to narcotic agent status: Secondary | ICD-10-CM | POA: Diagnosis not present

## 2024-10-18 DIAGNOSIS — I129 Hypertensive chronic kidney disease with stage 1 through stage 4 chronic kidney disease, or unspecified chronic kidney disease: Secondary | ICD-10-CM | POA: Diagnosis present

## 2024-10-18 DIAGNOSIS — Z79890 Hormone replacement therapy (postmenopausal): Secondary | ICD-10-CM | POA: Diagnosis not present

## 2024-10-18 DIAGNOSIS — Z8616 Personal history of COVID-19: Secondary | ICD-10-CM | POA: Diagnosis not present

## 2024-10-18 DIAGNOSIS — Z6830 Body mass index (BMI) 30.0-30.9, adult: Secondary | ICD-10-CM | POA: Diagnosis not present

## 2024-10-18 DIAGNOSIS — Z886 Allergy status to analgesic agent status: Secondary | ICD-10-CM | POA: Diagnosis not present

## 2024-10-18 DIAGNOSIS — G894 Chronic pain syndrome: Secondary | ICD-10-CM | POA: Diagnosis present

## 2024-10-18 DIAGNOSIS — Z96651 Presence of right artificial knee joint: Secondary | ICD-10-CM | POA: Diagnosis not present

## 2024-10-18 DIAGNOSIS — E876 Hypokalemia: Secondary | ICD-10-CM | POA: Diagnosis not present

## 2024-10-18 DIAGNOSIS — K760 Fatty (change of) liver, not elsewhere classified: Secondary | ICD-10-CM | POA: Diagnosis present

## 2024-10-18 DIAGNOSIS — M1711 Unilateral primary osteoarthritis, right knee: Secondary | ICD-10-CM | POA: Diagnosis present

## 2024-10-18 DIAGNOSIS — Z91013 Allergy to seafood: Secondary | ICD-10-CM | POA: Diagnosis not present

## 2024-10-18 DIAGNOSIS — F32A Depression, unspecified: Secondary | ICD-10-CM | POA: Diagnosis present

## 2024-10-18 DIAGNOSIS — M19011 Primary osteoarthritis, right shoulder: Secondary | ICD-10-CM | POA: Diagnosis present

## 2024-10-18 LAB — BASIC METABOLIC PANEL WITH GFR
Anion gap: 11 (ref 5–15)
Anion gap: 12 (ref 5–15)
Anion gap: 11 (ref 5–15)
BUN: 14 mg/dL (ref 6–20)
BUN: 11 mg/dL (ref 6–20)
BUN: 10 mg/dL (ref 6–20)
CO2: 34 mmol/L — ABNORMAL HIGH (ref 22–32)
CO2: 33 mmol/L — ABNORMAL HIGH (ref 22–32)
CO2: 33 mmol/L — ABNORMAL HIGH (ref 22–32)
Calcium: 8.9 mg/dL (ref 8.9–10.3)
Calcium: 9 mg/dL (ref 8.9–10.3)
Calcium: 8.8 mg/dL — ABNORMAL LOW (ref 8.9–10.3)
Chloride: 93 mmol/L — ABNORMAL LOW (ref 98–111)
Chloride: 90 mmol/L — ABNORMAL LOW (ref 98–111)
Chloride: 94 mmol/L — ABNORMAL LOW (ref 98–111)
Creatinine, Ser: 0.6 mg/dL (ref 0.44–1.00)
Creatinine, Ser: 0.63 mg/dL (ref 0.44–1.00)
Creatinine, Ser: 0.59 mg/dL (ref 0.44–1.00)
GFR, Estimated: >60 mL/min (ref >60)
GFR, Estimated: >60 mL/min (ref >60)
GFR, Estimated: >60 mL/min (ref >60)
Glucose, Bld: 126 mg/dL — ABNORMAL HIGH (ref 70–99)
Glucose, Bld: 160 mg/dL — ABNORMAL HIGH (ref 70–99)
Glucose, Bld: 122 mg/dL — ABNORMAL HIGH (ref 70–99)
Potassium: 2.2 mmol/L — CL (ref 3.5–5.1)
Potassium: 2.8 mmol/L — ABNORMAL LOW (ref 3.5–5.1)
Potassium: 3 mmol/L — ABNORMAL LOW (ref 3.5–5.1)
Sodium: 138 mmol/L (ref 135–145)
Sodium: 135 mmol/L (ref 135–145)
Sodium: 138 mmol/L (ref 135–145)

## 2024-10-18 LAB — MAGNESIUM: Magnesium: 1.7 mg/dL (ref 1.7–2.4)

## 2024-10-18 MED ORDER — POTASSIUM CHLORIDE CRYS ER 20 MEQ PO TBCR: 40.0000 meq | EXTENDED_RELEASE_TABLET | Freq: Three times a day (TID) | ORAL | Status: DC

## 2024-10-18 MED ORDER — POTASSIUM CHLORIDE CRYS ER 20 MEQ PO TBCR
40.0000 meq | EXTENDED_RELEASE_TABLET | Freq: Four times a day (QID) | ORAL | Status: DC
  Administered 2024-10-18: 22:00:00 40 meq via ORAL
  Filled 2024-10-18: qty 2

## 2024-10-18 MED ORDER — POLYETHYLENE GLYCOL 3350 17 G PO PACK
17.0000 g | PACK | Freq: Two times a day (BID) | ORAL | Status: DC
  Administered 2024-10-18 – 2024-10-22 (×9): 17 g via ORAL
  Filled 2024-10-18 (×8): qty 1

## 2024-10-18 MED ORDER — POTASSIUM CHLORIDE 10 MEQ/100ML IV SOLN
10.0000 meq | INTRAVENOUS | Status: AC
  Administered 2024-10-18 (×3): 10 meq via INTRAVENOUS
  Filled 2024-10-18 (×3): qty 100

## 2024-10-18 MED ORDER — POTASSIUM CHLORIDE CRYS ER 20 MEQ PO TBCR
40.0000 meq | EXTENDED_RELEASE_TABLET | Freq: Four times a day (QID) | ORAL | Status: DC
  Administered 2024-10-18 (×2): 40 meq via ORAL
  Filled 2024-10-18 (×2): qty 2

## 2024-10-18 MED ORDER — FUROSEMIDE 20 MG PO TABS
20.0000 mg | ORAL_TABLET | Freq: Two times a day (BID) | ORAL | Status: DC
  Administered 2024-10-20 – 2024-10-21 (×3): 20 mg via ORAL
  Filled 2024-10-18 (×3): qty 1

## 2024-10-18 MED ORDER — MAGNESIUM SULFATE 2 GM/50ML IV SOLN
2.0000 g | Freq: Once | INTRAVENOUS | Status: AC
  Administered 2024-10-18: 22:00:00 2 g via INTRAVENOUS
  Filled 2024-10-18: qty 50

## 2024-10-18 MED ORDER — CHLORTHALIDONE 25 MG PO TABS
12.5000 mg | ORAL_TABLET | Freq: Every morning | ORAL | Status: DC
  Administered 2024-10-20 – 2024-10-21 (×2): 12.5 mg via ORAL
  Filled 2024-10-18 (×2): qty 1

## 2024-10-18 MED ORDER — POTASSIUM CHLORIDE CRYS ER 20 MEQ PO TBCR
40.0000 meq | EXTENDED_RELEASE_TABLET | Freq: Once | ORAL | Status: AC
  Administered 2024-10-18: 11:00:00 40 meq via ORAL
  Filled 2024-10-18: qty 2

## 2024-10-18 MED ORDER — POTASSIUM CHLORIDE CRYS ER 20 MEQ PO TBCR
40.0000 meq | EXTENDED_RELEASE_TABLET | Freq: Two times a day (BID) | ORAL | Status: DC
  Administered 2024-10-18: 05:00:00 40 meq via ORAL
  Filled 2024-10-18: qty 2

## 2024-10-18 NOTE — Progress Notes (Addendum)
°  Subjective: Laura [name] is a 58 y.o. female s/p right TKA.  They are POD 2.  Pt's pain is controlled but moderate to severe at times.  Pt denies any complain of chest pain, shortness of breath, abdominal pain, calf pain.  Patient denies any fevers or chills.  No irregular heart beat or palpitations.  Feels very fatigued.  Objective: Vital signs in last 24 hours: Temp:  [97.7 F (36.5 C)-100 F (37.8 C)] 97.7 F (36.5 C) (12/18 0754) Pulse Rate:  [75-86] 86 (12/18 0754) Resp:  [16-19] 16 (12/18 0754) BP: (118-161)/(69-84) 161/84 (12/18 0754) SpO2:  [91 %-97 %] 91 % (12/18 0754)  Intake/Output from previous day: 12/17 0701 - 12/18 0700 In: 120 [P.O.:120] Out: -  Intake/Output this shift: Total I/O In: 240 [P.O.:240] Out: -   Exam:  No gross blood or drainage overlying the dressing 2+ DP pulse Sensation intact distally in the operative foot Able to dorsiflex and plantarflex the operative foot No calf tenderness.  Negative Homans' sign. Unable to perform straight leg raise Regular rate and rhythm with 2+ radial pulse and 2+ DP pulse of bilateral lower and upper extremities   Labs: No results for input(s): HGB in the last 72 hours. No results for input(s): WBC, RBC, HCT, PLT in the last 72 hours. Recent Labs    10/17/24 0525 10/18/24 0316  NA 137 138  K 3.3* 2.2*  CL 96* 93*  CO2 24 34*  BUN 17 14  CREATININE 0.75 0.60  GLUCOSE 161* 126*  CALCIUM  9.2 8.9   No results for input(s): LABPT, INR in the last 72 hours.  Assessment/Plan: Pt is POD 2 s/p TKA.    -Plan to discharge to home in coming days pending patient's pain and PT eval as well as her potassium status  - Found to be critically hypokalemic overnight and had potassium supplementation.  Plan to adjust her regular potassium supplementation back to previous level that she has had in the past.  We will plan to keep her on cardiac monitoring for the next 12 hours until her potassium is repleted  back to normal level.  Consult hospitalist team for their assistance with repletion; appreciate their expertise..  -Ordered BMP to check her potassium this morning as well as ordered magnesium  to check this level as well  -WBAT with a walker  -Follow-up with Dr. Addie in clinic 2 weeks postoperatively    [facility] 10/18/2024, 11:03 AM
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
## 2024-10-18 NOTE — Progress Notes (Signed)
 PT Cancellation Note  Patient Details Name: Laura [name] MRN: [id number] DOB: [birth year]   Cancelled Treatment:    Reason Eval/Treat Not Completed: (P) Medical issues which prohibited therapy (Note pt with K+ less than 2.5 this AM, per RN pt to have labs redrawn, PTA will reassess later in the day after new lab results in.)   Makynzi Eastland M Zacchaeus Halm 10/18/2024, 10:00 AM

## 2024-10-18 NOTE — TOC Transition Note (Incomplete)
 Transition of Care [facility]) - Discharge Note   Patient Details  Name: Laura [name] MRN: [id number] Date of Birth: November 24, 1965  [facility] CM/SW Contact:  Rosalva Jon Bloch, RN Phone Number: 10/18/2024, 10:27 AM   Clinical Narrative:    Patient will DC to: home Anticipated DC date: 10/18/2024 Family notified: yes Transport by: car       -s/p R TKA,12/18  Per MD patient ready for DC today . RN, patient, patient's family, and  [facility] notified of DC.   Post hospital f/u noted on AVS. Pt without RX med concerns or transportation issues.  RNCM will sign off for now as intervention is no longer needed. Please consult us  again if new needs arise.   Final next level of care: Home w Home Health Services Barriers to Discharge: No Barriers Identified   Patient Goals and CMS Choice     Choice offered to / list presented to : Patient      Discharge Placement                       Discharge Plan and Services Additional resources added to the After Visit Summary for                  DME Arranged: Bedside commode, Walker rolling   Date DME Agency Contacted: 10/18/24 Time DME Agency Contacted: 1027 Representative spoke with at DME Agency: [facility] Arranged: PT HH Agency: [facility] Date HH Agency Contacted: 10/18/24 Time HH Agency Contacted: 1027 Representative spoke with at [facility] Agency: [facility]  Social Drivers of Health (SDOH) Interventions SDOH Screenings   Food Insecurity: Food Insecurity Present (10/16/2024)  Housing: Low Risk (10/16/2024)  Transportation Needs: No Transportation Needs (10/16/2024)  Utilities: Not At Risk (10/16/2024)  Depression (PHQ2-9): High Risk (08/30/2024)  Financial Resource Strain: Low Risk (09/13/2023)  Physical Activity: Insufficiently Active (09/13/2023)  Social Connections: Patient Declined (06/14/2024)  Stress: Stress Concern Present (09/13/2023)  Tobacco Use: Medium Risk (10/16/2024)   Health Literacy: Adequate Health Literacy (09/13/2023)     Readmission Risk Interventions     No data to display

## 2024-10-18 NOTE — Progress Notes (Signed)
 Physical Therapy Treatment Patient Details Name: Laura [name] MRN: [id number] DOB: 11/10/1965 Today's Date: 10/18/2024   History of Present Illness 58 y.o. female presents to [facility] hospital on 10/16/2024 for elective R TKA. Post-op hypokalemia. PMH includes hypothyroidism, HTN, HLD, fibromyalgia, GAD, CKD, depression.    PT Comments  Pt received in supine, lethargic and reluctant to perform OOB mobility until she is due for pain meds later. Pt initially agreeable to supine exercises for RLE ROM/strengthening, but once done with these, pt agreeable to OOB mobility due to urinary urgency. Pt with gas but not yet able to have BM, and c/o increased dizziness/possible nausea this date with standing/gait trial. Pt unable to tolerate additional standing trials for orthostatic BP assessment, RN notified of pt symptoms. Pt may benefit from vestibular PT assessment following date if symptoms persist and orthostatics negative. Could consider BLE compression socks or ace wraps as well, as pt with observed RLE edema, may need thigh-high compression as knee-high TED hose would likely be too painful due to surgical site, pt reports some claustrophobia so unsure if she would tolerate however. Pt continues to benefit from PT services to progress toward functional mobility goals, pt needing increased physical assist, up to minA for transfers this date due to pain/mild impulsivity and dizziness. Pt continues to benefit from PT services to progress toward functional mobility goals, pt reports not yet feeling well enough to go home today.    If plan is discharge home, recommend the following: A little help with bathing/dressing/bathroom;Assistance with cooking/housework;Assist for transportation;Help with stairs or ramp for entrance;A lot of help with walking and/or transfers (increased assist today due to pain)   Can travel by private vehicle        Equipment Recommendations  Rolling walker (2 wheels);BSC/3in1     Recommendations for Other Services       Precautions / Restrictions Precautions Precautions: Fall;Knee Precaution Booklet Issued: Yes (comment) Recall of Precautions/Restrictions: Intact Precaution/Restrictions Comments: consistent dizziness with standing/gait Required Braces or Orthoses: Knee Immobilizer - Right Knee Immobilizer - Right: On when out of bed or walking Restrictions Weight Bearing Restrictions Per Provider Order: Yes RLE Weight Bearing Per Provider Order: Weight bearing as tolerated     Mobility  Bed Mobility Overal bed mobility: Needs Assistance Bed Mobility: Supine to Sit, Sit to Supine     Supine to sit: Used rails, HOB elevated, Min assist Sit to supine: Used rails, Min assist   General bed mobility comments: no LE assist needed in PM session, pt had KI donned by PTA prior to initiating to EOB    Transfers Overall transfer level: Needs assistance Equipment used: Rolling walker (2 wheels) Transfers: Sit to/from Stand Sit to Stand: Min assist, Contact guard assist           General transfer comment: from EOB light minA to rise/stabilize and to sit to lower toilet surface this date due to increased pain/fatigue. CGA for STS from toilet with L wall rail and RW support and minA to sit on EOB due to pain/fatigue and pt c/o dizziness.    Ambulation/Gait Ambulation/Gait assistance: Contact guard assist Gait Distance (Feet): 15 Feet (x2 with seated break ~5 mins on toilet) Assistive device: Rolling walker (2 wheels) Gait Pattern/deviations: Step-to pattern, Decreased step length - left, Decreased weight shift to right, Trunk flexed, Wide base of support, Antalgic Gait velocity: reduced     General Gait Details: slowed step-to gait, reduced weight shift to RLE, distance limited by pt c/o severe pain and  c/o increased dizziness this date. PTA planning to check orthostatics however pt unable to tolerate sitting EOB for orthsotatic BP post-ambulation due to  increased dizziness. SpO2 91-92% on RA, HR low 90's bpm with exertion, decreased to mid-80's bpm resting per tele.   Stairs         General stair comments: pt defers due to c/o dizziness/fatigue and noted lethargy   Wheelchair Mobility     Tilt Bed    Modified Rankin (Stroke Patients Only)       Balance Overall balance assessment: Needs assistance Sitting-balance support: No upper extremity supported, Feet supported Sitting balance-Leahy Scale: Fair Sitting balance - Comments: some CGA for safety when more dizzy/fatigued at EOB post-ambulation   Standing balance support: Bilateral upper extremity supported, Reliant on assistive device for balance Standing balance-Leahy Scale: Poor Standing balance comment: RW for dynamic tasks, CGA for safety 2/2 dizziness                            Communication Communication Communication: No apparent difficulties  Cognition Arousal: Lethargic Behavior During Therapy: WFL for tasks assessed/performed, Impulsive   PT - Cognitive impairments: No apparent impairments                       PT - Cognition Comments: A&O, mildly impulsive when pain/dizziness increases as she fatigues. Following commands: Intact      Cueing Cueing Techniques: Verbal cues, Gestural cues  Exercises Total Joint Exercises Ankle Circles/Pumps: AROM, Both, Supine, 10 reps Quad Sets: AROM, Right, 5 reps, Supine Short Arc Quad: AAROM, PROM, Right, Supine, 10 reps (at times AA, nearly PROM due to limited quad contraction; very painful) Heel Slides: Right, Supine, Limitations, AROM, AAROM, 10 reps (partial AROM, then AA for improved flexion as tolerated) Heel Slides Limitations: pain Hip ABduction/ADduction: AAROM, Right, 5 reps, Supine Straight Leg Raises: AAROM, Right, Supine (a few reps while repositioning, quad lag so unable to perform without AA) Long Arc Quad:  (pt defers due to pain, able to perform heel slides) Goniometric ROM:  limited assessment due to pt pain/not able to maintain flexion for long, but grossly 10 deg to 55 deg R knee flexion in supine this date.    General Comments General comments (skin integrity, edema, etc.): c/o increased dizziness and mild nausea with postural changes/standing today, did not improve per pt until return to supine      Pertinent Vitals/Pain Pain Assessment Pain Assessment: Faces Faces Pain Scale: Hurts whole lot Pain Location: R knee, esp with flexion (anterior/shin pain) and ambulation Pain Descriptors / Indicators: Aching, Constant, Burning, Grimacing, Operative site guarding, Sharp, Tightness Pain Intervention(s): Limited activity within patient's tolerance, Monitored during session, Premedicated before session, Repositioned, Ice applied (iceman refilled/applied)    Home Living                          Prior Function            PT Goals (current goals can now be found in the care plan section) Acute Rehab PT Goals Patient Stated Goal: to return to independence PT Goal Formulation: With patient/family Time For Goal Achievement: 10/20/24 Progress towards PT goals: Progressing toward goals (very slowly)    Frequency    7X/week      PT Plan      Co-evaluation              AM-PAC PT  6 Clicks Mobility   Outcome Measure  Help needed turning from your back to your side while in a flat bed without using bedrails?: A Little Help needed moving from lying on your back to sitting on the side of a flat bed without using bedrails?: A Little Help needed moving to and from a bed to a chair (including a wheelchair)?: A Little Help needed standing up from a chair using your arms (e.g., wheelchair or bedside chair)?: A Little Help needed to walk in hospital room?: A Lot (rec chair follow due to symptoms today) Help needed climbing 3-5 steps with a railing? : Total (pain/dizziness limiting today) 6 Click Score: 15    End of Session Equipment Utilized  During Treatment: Gait belt;Right knee immobilizer Activity Tolerance: Patient limited by pain;Patient limited by lethargy;Other (comment) (c/o dizziness (not able to check BP in sitting/standing due to pt symptoms and need for physical assist)) Patient left: with call bell/phone within reach;Other (comment);in bed;with bed alarm set (iceman donned) Nurse Communication: Mobility status;Patient requests pain meds;Precautions;Weight bearing status (board updated to include need to wear KI for pivoting OOB, pt request all staff use gait belt) PT Visit Diagnosis: Other abnormalities of gait and mobility (R26.89);Muscle weakness (generalized) (M62.81);Pain Pain - Right/Left: Right Pain - part of body: Knee     Time: 8751-8673 PT Time Calculation (min) (ACUTE ONLY): 38 min  Charges:    $Gait Training: 8-22 mins $Therapeutic Exercise: 8-22 mins $Therapeutic Activity: 8-22 mins PT General Charges $$ ACUTE PT VISIT: 1 Visit                     Carmina Walle P., PTA Acute Rehabilitation Services Secure Chat Preferred 9a-5:30pm Office: [phone]    Connell HERO [facility] 10/18/2024, 1:50 PM

## 2024-10-18 NOTE — Progress Notes (Signed)
 Orthopedic Tech Progress Note Patient Details:  Laura [name] September 23, 1966 [id number]  Attempted to place pt in the CPM, but pt states she is experiencing 6/10 pain and would like to wait for the pain to lessen before using it. I spoke with the RN and we will try the CPM again when she gets her Q4H pain medicine next. The pt was also out of the bone foam and was only able to tolerate it for no more than 2 minutes d/t R hip and knee discomfort when I placed her in it. The iceman was reapplied to the knee.  Patient ID: Laura [name], female   DOB: 05/15/1966, 58 y.o.   MRN: [id number]  Laura [name] 10/18/2024, 7:40 PM

## 2024-10-18 NOTE — Progress Notes (Signed)
 End of shift summary:   Delon from lab called critical value of Potassium 2.2 at 0400. [facility] after hours called 0404 to report. New orders received per Dr Vernetta to start Potassium 40mEq BID today, first dose now. Med given, also PRN Dulcolax per patient request. NAD noted, neuro, vascular and musculoskeletal unchanged. Call light in reach, safety measures maintained.

## 2024-10-18 NOTE — Plan of Care (Signed)
  Problem: Clinical Measurements: Goal: Ability to maintain clinical measurements within normal limits will improve Outcome: Progressing Goal: Cardiovascular complication will be avoided Outcome: Progressing   Problem: Nutrition: Goal: Adequate nutrition will be maintained Outcome: Progressing   Problem: Elimination: Goal: Will not experience complications related to urinary retention Outcome: Progressing

## 2024-10-18 NOTE — Plan of Care (Signed)
°  Problem: Education: Goal: Knowledge of General Education information will improve Description: Including pain rating scale, medication(s)/side effects and non-pharmacologic comfort measures Outcome: Progressing   Problem: Health Behavior/Discharge Planning: Goal: Ability to manage health-related needs will improve Outcome: Progressing   Problem: Clinical Measurements: Goal: Ability to maintain clinical measurements within normal limits will improve Outcome: Progressing Goal: Will remain free from infection Outcome: Progressing Goal: Diagnostic test results will improve Outcome: Progressing Goal: Respiratory complications will improve Outcome: Progressing Goal: Cardiovascular complication will be avoided Outcome: Progressing   Problem: Activity: Goal: Risk for activity intolerance will decrease Outcome: Progressing   Problem: Nutrition: Goal: Adequate nutrition will be maintained Outcome: Progressing   Problem: Elimination: Goal: Will not experience complications related to urinary retention Outcome: Progressing   Problem: Safety: Goal: Ability to remain free from injury will improve Outcome: Progressing   Problem: Skin Integrity: Goal: Risk for impaired skin integrity will decrease Outcome: Progressing   Problem: Education: Goal: Knowledge of the prescribed therapeutic regimen will improve Outcome: Progressing Goal: Individualized Educational Video(s) Outcome: Progressing   Problem: Activity: Goal: Ability to avoid complications of mobility impairment will improve Outcome: Progressing Goal: Range of joint motion will improve Outcome: Progressing   Problem: Clinical Measurements: Goal: Postoperative complications will be avoided or minimized Outcome: Progressing   Problem: Pain Management: Goal: Pain level will decrease with appropriate interventions Outcome: Progressing   Problem: Skin Integrity: Goal: Will show signs of wound healing Outcome:  Progressing   Problem: Coping: Goal: Level of anxiety will decrease Outcome: Not Progressing   Problem: Elimination: Goal: Will not experience complications related to bowel motility Outcome: Not Progressing   Problem: Pain Managment: Goal: General experience of comfort will improve and/or be controlled Outcome: Not Progressing

## 2024-10-19 DIAGNOSIS — Z96651 Presence of right artificial knee joint: Secondary | ICD-10-CM | POA: Diagnosis not present

## 2024-10-19 LAB — BASIC METABOLIC PANEL WITH GFR
Anion gap: 8 (ref 5–15)
BUN: 9 mg/dL (ref 6–20)
CO2: 33 mmol/L — ABNORMAL HIGH (ref 22–32)
Calcium: 8.7 mg/dL — ABNORMAL LOW (ref 8.9–10.3)
Chloride: 98 mmol/L (ref 98–111)
Creatinine, Ser: 0.61 mg/dL (ref 0.44–1.00)
GFR, Estimated: >60 mL/min
Glucose, Bld: 127 mg/dL — ABNORMAL HIGH (ref 70–99)
Potassium: 3.5 mmol/L (ref 3.5–5.1)
Sodium: 138 mmol/L (ref 135–145)

## 2024-10-19 LAB — MAGNESIUM: Magnesium: 2.2 mg/dL (ref 1.7–2.4)

## 2024-10-19 MED ORDER — POTASSIUM CHLORIDE CRYS ER 20 MEQ PO TBCR
40.0000 meq | EXTENDED_RELEASE_TABLET | Freq: Four times a day (QID) | ORAL | Status: DC
  Administered 2024-10-19 – 2024-10-22 (×13): 40 meq via ORAL
  Filled 2024-10-19 (×14): qty 2

## 2024-10-19 NOTE — Progress Notes (Signed)
 "  CONSULT F/U NOTE  Laura [name]  FMW:[id number] DOB: 1966/09/13 DOA: 10/16/2024 PCP: Milon Cleaves, PA    Brief Narrative:  58 year old with an extensive medical history as noted below who was admitted to the hospital by Dr. 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 Her postoperative course has been complicated by severe refractory hypokalemia, for which I was consulted. 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 She is aware that chlorthalidone  and Lasix  both can contribute to hypokalemia but states that she developed severe peripheral edema in the absence of these medications. She reports that her hypokalemia is usually able to be managed by high-dose supplemental potassium at home but that she is prone to exacerbations during episodes of acute illness such as at present. She denies chest pain shortness of breath nausea vomiting or abdominal pain at present.   Goals of Care:   Code Status: Full Code   Interim Hx: No acute events reported overnight.  The patient is not progressing well with therapy and therefore Orthopedics is pursuing SNF placement for rehab.  She has no new complaints at time of my visit.  She is tolerating her oral serum replacement without difficulty.  Assessment & Plan:  Familial hypokalemia NOS - severe acute hypokalemia  Continuing to hold chlorthalidone  and Lasix  for now with plan to resume them both tomorrow -improving nicely with aggressive oral supplementation augmented with IV supplementation -continue with oral supplementation alone today - recheck in a.m. -she is now stable to come off of cardiac monitoring  Hypomagnesemia Likely due to poor oral intake and use of diuretics -supplemented to goal of 2.0   Anxiety/depression Continue usual outpatient medications   DM2 Appears well-controlled at present -no changes recommended in  her treatment regimen today   Hashimoto's resulting in hypothyroidism Continue usual Synthroid  supplementation   Disposition: The patient is medically stable for disposition at the discretion of the primary service at this time   Objective: Blood pressure (!) 151/83, pulse 88, temperature 97.9 F (36.6 C), temperature source Oral, resp. rate 18, height 5' 5 (1.651 m), weight 82.6 kg, SpO2 93%.  Intake/Output Summary (Last 24 hours) at 10/19/2024 0941 Last data filed at 10/19/2024 0852 Gross per 24 hour  Intake 1504.03 ml  Output --  Net 1504.03 ml   Filed Weights   10/16/24 0722  Weight: 82.6 kg    Examination: General: No acute respiratory distress Lungs: Clear to auscultation bilaterally without wheezes or crackles Cardiovascular: Regular rate and rhythm without murmur  Abdomen: NT/ND, soft, BS+ Extremities: No significant C/C/E B LE    Basic Metabolic Panel: Recent Labs  Lab 10/18/24 1050 10/18/24 1743 10/19/24 0431  NA 135 138 138  K 2.8* 3.0* 3.5  CL 90* 94* 98  CO2 33* 33* 33*  GLUCOSE 160* 122* 127*  BUN 11 10 9   CREATININE 0.63 0.59 0.61  CALCIUM  9.0 8.8* 8.7*  MG 1.7  --  2.2   GFR: Estimated Creatinine Clearance: 80.3 mL/min (by C-G formula based on SCr of 0.61 mg/dL).   Scheduled Meds:  ALPRAZolam   1 mg Oral TID   aspirin   81 mg Oral BID   budesonide -glycopyrrolate -formoterol   2 puff Inhalation BID   carvedilol   25 mg Oral BID WC   And   carvedilol   6.25 mg Oral Q1200   [START ON 10/20/2024] chlorthalidone   12.5 mg Oral q morning   docusate  sodium  100 mg Oral BID   famotidine   20 mg Oral BID   [START ON 10/20/2024] furosemide   20 mg Oral Q12H   levothyroxine   88 mcg Oral Daily   naproxen   250 mg Oral BID WC   polyethylene glycol  17 g Oral BID   potassium chloride   40 mEq Oral QID   prazosin   2 mg Oral BID   Vonoprazan Fumarate   10 mg Oral Daily   zolpidem   5 mg Oral QHS      LOS: 1 day   Reyes IVAR Moores, MD [facility] Office  [phone] Pager - Text Page per Tracey  If 7PM-7AM, please contact night-coverage per Amion 10/19/2024, 9:41 AM     "

## 2024-10-19 NOTE — Plan of Care (Signed)
  Problem: Health Behavior/Discharge Planning: Goal: Ability to manage health-related needs will improve Outcome: Progressing   Problem: Clinical Measurements: Goal: Ability to maintain clinical measurements within normal limits will improve Outcome: Progressing   Problem: Nutrition: Goal: Adequate nutrition will be maintained Outcome: Progressing   Problem: Coping: Goal: Level of anxiety will decrease Outcome: Progressing   Problem: Elimination: Goal: Will not experience complications related to urinary retention Outcome: Progressing

## 2024-10-19 NOTE — Progress Notes (Signed)
 PT Cancellation Note  Patient Details Name: Laura [name] MRN: [id number] DOB: June 23, 1966   Cancelled Treatment:    Reason Eval/Treat Not Completed: (P) Other (comment);Pain limiting ability to participate (pt requests pain meds prior to session, RN notified. PTA plan to reattempt around 4:15 or 4:30pm.)   Connell HERO Theta Leaf 10/19/2024, 3:41 PM

## 2024-10-19 NOTE — Progress Notes (Signed)
" °  Subjective: Patient stable.  Having a lot of pain despite Dilaudid .  Appreciate Dr. Danton and the hospitalist service consulting on the patient.  Patient has been slow to mobilize and does not tolerate therapy in the CPM machine very well.   Objective: Vital signs in last 24 hours: Temp:  [97.5 F (36.4 C)-99.9 F (37.7 C)] 99.9 F (37.7 C) (12/19 0331) Pulse Rate:  [79-93] 93 (12/19 0331) Resp:  [16-19] 19 (12/19 0331) BP: (101-161)/(68-85) 134/85 (12/19 0331) SpO2:  [91 %-94 %] 93 % (12/19 0331)  Intake/Output from previous day: 12/18 0701 - 12/19 0700 In: 1504 [P.O.:1180; IV Piggyback:324] Out: -  Intake/Output this shift: No intake/output data recorded.  Exam:  Sensation intact distally Intact pulses distally Dorsiflexion/Plantar flexion intact  Labs: No results for input(s): HGB in the last 72 hours. No results for input(s): WBC, RBC, HCT, PLT in the last 72 hours. Recent Labs    10/18/24 1743 10/19/24 0431  NA 138 138  K 3.0* 3.5  CL 94* 98  CO2 33* 33*  BUN 10 9  CREATININE 0.59 0.61  GLUCOSE 122* 127*  CALCIUM  8.8* 8.7*   No results for input(s): LABPT, INR in the last 72 hours.  Assessment/Plan: Plan at this time is initiate placement to skilled nursing.  Potassium 3.5 this morning magnesium  2.2.  Medications may need to be adjusted per medical consultants.  Encouraged the patient to try to spend as much time as she can on the CPM machine getting the knee  bending.   G Scott Nelda Luckey 10/19/2024, 7:08 AM     "

## 2024-10-19 NOTE — Progress Notes (Signed)
 Physical Therapy Treatment Patient Details Name: Laura [name] MRN: [id number] DOB: 08/17/1966 Today's Date: 10/19/2024   History of Present Illness 58 y.o. female presents to [facility] hospital on 10/16/2024 for elective R TKA. Post-op hypokalemia. PMH includes hypothyroidism, HTN, HLD, fibromyalgia, GAD, CKD, depression.    PT Comments  Laura [name] declines SNF at this time; reports she wants to go home. She was given IV pain medication by RN prior to session. Discussed expectations at this phase of her recovery and goals she needs to meet in order to be able to progress home. She is requiring min assist for bed mobility/transfers and ambulating 30 ft with a RW with step to pattern. Pt reports dizziness and requests to return to bed. Ice man and CPM donned. Goal this PM session is to increase her walking distance to a household distance if tolerated.     If plan is discharge home, recommend the following: A little help with bathing/dressing/bathroom;Assistance with cooking/housework;Assist for transportation;Help with stairs or ramp for entrance;A lot of help with walking and/or transfers   Can travel by private vehicle        Equipment Recommendations  Rolling walker (2 wheels);BSC/3in1    Recommendations for Other Services       Precautions / Restrictions Precautions Precautions: Fall;Knee Precaution Booklet Issued: Yes (comment) Recall of Precautions/Restrictions: Intact Required Braces or Orthoses: Knee Immobilizer - Right Knee Immobilizer - Right: On when out of bed or walking Restrictions Weight Bearing Restrictions Per Provider Order: Yes RLE Weight Bearing Per Provider Order: Weight bearing as tolerated     Mobility  Bed Mobility Overal bed mobility: Needs Assistance Bed Mobility: Sit to Supine       Sit to supine: Used rails, Min assist   General bed mobility comments: RLE assist back into bed    Transfers Overall transfer level: Needs assistance Equipment used: Rolling  walker (2 wheels) Transfers: Sit to/from Stand Sit to Stand: Min assist, Contact guard assist           General transfer comment: MinA from toilet, CGA from chair    Ambulation/Gait Ambulation/Gait assistance: Contact guard assist Gait Distance (Feet): 35 Feet Assistive device: Rolling walker (2 wheels) Gait Pattern/deviations: Step-to pattern, Decreased step length - left, Decreased weight shift to right, Trunk flexed, Wide base of support, Antalgic Gait velocity: reduced     General Gait Details: Verbal cues for shorter R step length and R heel strike at inital contact. Moderate use of arms on RW   Stairs             Wheelchair Mobility     Tilt Bed    Modified Rankin (Stroke Patients Only)       Balance Overall balance assessment: Needs assistance Sitting-balance support: No upper extremity supported, Feet supported Sitting balance-Leahy Scale: Fair     Standing balance support: Bilateral upper extremity supported, Reliant on assistive device for balance Standing balance-Leahy Scale: Poor Standing balance comment: RW for dynamic tasks, CGA for safety 2/2 dizziness                            Communication Communication Communication: No apparent difficulties  Cognition Arousal: Alert Behavior During Therapy: WFL for tasks assessed/performed, Impulsive   PT - Cognitive impairments: No apparent impairments                         Following commands: Intact  Cueing Cueing Techniques: Verbal cues, Gestural cues  Exercises      General Comments        Pertinent Vitals/Pain Pain Assessment Pain Assessment: Faces Faces Pain Scale: Hurts even more Pain Location: R knee in dependent position Pain Descriptors / Indicators: Grimacing, Operative site guarding, Sharp, Tightness Pain Intervention(s): Limited activity within patient's tolerance, Monitored during session, Premedicated before session, Ice applied    Home Living                           Prior Function            PT Goals (current goals can now be found in the care plan section) Acute Rehab PT Goals Patient Stated Goal: to return to independence PT Goal Formulation: With patient/family Time For Goal Achievement: 10/20/24 Potential to Achieve Goals: Good Progress towards PT goals: Progressing toward goals    Frequency    7X/week      PT Plan      Co-evaluation              AM-PAC PT 6 Clicks Mobility   Outcome Measure  Help needed turning from your back to your side while in a flat bed without using bedrails?: A Little Help needed moving from lying on your back to sitting on the side of a flat bed without using bedrails?: A Little Help needed moving to and from a bed to a chair (including a wheelchair)?: A Little Help needed standing up from a chair using your arms (e.g., wheelchair or bedside chair)?: A Little Help needed to walk in hospital room?: A Little Help needed climbing 3-5 steps with a railing? : A Lot 6 Click Score: 17    End of Session Equipment Utilized During Treatment: Gait belt;Right knee immobilizer Activity Tolerance: Patient tolerated treatment well Patient left: with call bell/phone within reach;Other (comment);in bed;with bed alarm set (iceman donned) Nurse Communication: Mobility status PT Visit Diagnosis: Other abnormalities of gait and mobility (R26.89);Muscle weakness (generalized) (M62.81);Pain Pain - Right/Left: Right Pain - part of body: Knee     Time: 1023-1106 PT Time Calculation (min) (ACUTE ONLY): 43 min  Charges:    $Therapeutic Activity: 38-52 mins PT General Charges $$ ACUTE PT VISIT: 1 Visit                     Aleck Daring, PT, DPT Acute Rehabilitation Services Office [phone]    Aleck ONEIDA Daring 10/19/2024, 11:24 AM

## 2024-10-19 NOTE — Progress Notes (Signed)
 Physical Therapy Treatment Patient Details Name: Laura [name] MRN: [id number] DOB: 1966-08-28 Today's Date: 10/19/2024   History of Present Illness 58 y.o. female presents to [facility] hospital on 10/16/2024 for elective R TKA. Post-op hypokalemia. PMH includes hypothyroidism, HTN, HLD, fibromyalgia, GAD, CKD, depression.    PT Comments  Pt received in supine after premedication with PO meds, pt more drowsy when PTA arrived to her room and requesting assist to get OOB to bathroom, pt agreeable to a couple supine exercises on RLE prior to KI placement and bed mobility. Pt limited due to c/o fatigue and severe pain as well as feeling too cold when getting to EOB/bathroom, only able to tolerate ~41ft ambulation with RW and consistent minA needed today for bed mobility and transfers due to RLE pain. PTA reviewed benefits of mobility, esp OOB to chair, pt adamantly defers transfer to chair due to pain/feeling too cold and fatigue. Patient will benefit from continued inpatient follow up therapy, <3 hours/day as per surgeon recommendation with her slow progress toward goals, however per AM PT note, pt prefers HHPT. Recommend pt arrange for increased assist from friends or to hire private caregivers at home to support her upon DC. She may benefit from OT assessment to work more on iADLS given slow progress toward her goals this week.   If plan is discharge home, recommend the following: A little help with bathing/dressing/bathroom;Assistance with cooking/housework;Assist for transportation;Help with stairs or ramp for entrance;A lot of help with walking and/or transfers   Can travel by private vehicle        Equipment Recommendations  Rolling walker (2 wheels);BSC/3in1    Recommendations for Other Services       Precautions / Restrictions Precautions Precautions: Fall;Knee Precaution Booklet Issued: Yes (comment) Recall of Precautions/Restrictions: Intact Required Braces or Orthoses: Knee  Immobilizer - Right Knee Immobilizer - Right: On when out of bed or walking Restrictions Weight Bearing Restrictions Per Provider Order: Yes RLE Weight Bearing Per Provider Order: Weight bearing as tolerated     Mobility  Bed Mobility Overal bed mobility: Needs Assistance Bed Mobility: Supine to Sit, Sit to Supine     Supine to sit: Min assist Sit to supine: Used rails, Min assist   General bed mobility comments: pt requesting assist for RLE to and from EOB, pt able to use gait belt for portions of transfer to EOB, but pt defers to use it when returning to supine due to increased pain/fatigue and feeling cold.    Transfers Overall transfer level: Needs assistance Equipment used: Rolling walker (2 wheels) Transfers: Sit to/from Stand Sit to Stand: Min assist           General transfer comment: from EOB<>RW and RW<>low toilet height with L rail    Ambulation/Gait Ambulation/Gait assistance: Contact guard assist Gait Distance (Feet): 15 Feet (x2) Assistive device: Rolling walker (2 wheels) Gait Pattern/deviations: Step-to pattern, Decreased step length - left, Decreased weight shift to right, Trunk flexed, Wide base of support, Antalgic Gait velocity: reduced     General Gait Details: heavy BUE reliance on RW, pt reporting pain more severe in RLE in afternoon session and pt adamantly defers hallway or longer room distance ambulation despite encouragement from PTA. Pt reports I need to order dinner, and return to my nap. Pt c/o feeling very cold throughout. HR WFL   Stairs Stairs:  (pt defers due to pain/fatigue)           Wheelchair Mobility     Tilt Bed  Modified Rankin (Stroke Patients Only)       Balance Overall balance assessment: Needs assistance Sitting-balance support: No upper extremity supported, Feet supported Sitting balance-Leahy Scale: Fair     Standing balance support: Bilateral upper extremity supported, Reliant on assistive device  for balance Standing balance-Leahy Scale: Poor Standing balance comment: RW for dynamic tasks, CGA for safety 2/2 dizziness                            Communication Communication Communication: No apparent difficulties  Cognition Arousal: Alert Behavior During Therapy: WFL for tasks assessed/performed, Flat affect   PT - Cognitive impairments: No apparent impairments                       PT - Cognition Comments: Pt oriented, more drowsy after getting pain meds Following commands: Intact      Cueing Cueing Techniques: Verbal cues, Gestural cues  Exercises Total Joint Exercises Ankle Circles/Pumps: AROM, Both, Supine, 10 reps Heel Slides: AROM, AAROM, Right, 5 reps, Supine (prior to donning KI) Goniometric ROM: grossly 5 deg to 55 deg R knee flexion, pain limiting time in flexion so not formally assessed; pt reports she did tolerate CPM earlier in the day    General Comments General comments (skin integrity, edema, etc.): HR WFL per tele      Pertinent Vitals/Pain Pain Assessment Pain Assessment: 0-10 Pain Score: 6  Faces Pain Scale: Hurts even more Pain Location: R knee with standing and dependent posture Pain Descriptors / Indicators: Grimacing, Operative site guarding, Sharp, Tightness Pain Intervention(s): Monitored during session, Limited activity within patient's tolerance, Premedicated before session, Repositioned, Other (comment) (pt defers ice as she feels cold today; warm blankets placed over her)    Home Living                          Prior Function            PT Goals (current goals can now be found in the care plan section) Acute Rehab PT Goals Patient Stated Goal: to return to independence PT Goal Formulation: With patient/family Time For Goal Achievement: 10/20/24 Progress towards PT goals: Progressing toward goals (slowly)    Frequency    7X/week      PT Plan      Co-evaluation              AM-PAC PT  6 Clicks Mobility   Outcome Measure  Help needed turning from your back to your side while in a flat bed without using bedrails?: A Little Help needed moving from lying on your back to sitting on the side of a flat bed without using bedrails?: A Lot (increased cues/assist this date) Help needed moving to and from a bed to a chair (including a wheelchair)?: A Little Help needed standing up from a chair using your arms (e.g., wheelchair or bedside chair)?: A Little Help needed to walk in hospital room?: A Little Help needed climbing 3-5 steps with a railing? : Total 6 Click Score: 15    End of Session Equipment Utilized During Treatment: Gait belt;Right knee immobilizer Activity Tolerance: Patient limited by fatigue;Patient limited by pain Patient left: with call bell/phone within reach;Other (comment);in bed;with bed alarm set (pt refusing iceman/CPM due to pain and feeling cold) Nurse Communication: Mobility status PT Visit Diagnosis: Other abnormalities of gait and mobility (R26.89);Muscle weakness (generalized) (M62.81);Pain Pain -  Right/Left: Right Pain - part of body: Knee     Time: 8362-8345 PT Time Calculation (min) (ACUTE ONLY): 17 min  Charges:    $Therapeutic Activity: 8-22 mins PT General Charges $$ ACUTE PT VISIT: 1 Visit                     Embry Huss P., PTA Acute Rehabilitation Services Secure Chat Preferred 9a-5:30pm Office: [phone]    Laura [name] 10/19/2024, 5:22 PM

## 2024-10-20 DIAGNOSIS — Z96651 Presence of right artificial knee joint: Secondary | ICD-10-CM | POA: Diagnosis not present

## 2024-10-20 LAB — BASIC METABOLIC PANEL WITH GFR
Anion gap: 7 (ref 5–15)
BUN: 13 mg/dL (ref 6–20)
CO2: 30 mmol/L (ref 22–32)
Calcium: 8.8 mg/dL — ABNORMAL LOW (ref 8.9–10.3)
Chloride: 103 mmol/L (ref 98–111)
Creatinine, Ser: 0.53 mg/dL (ref 0.44–1.00)
GFR, Estimated: >60 mL/min
Glucose, Bld: 147 mg/dL — ABNORMAL HIGH (ref 70–99)
Potassium: 4.1 mmol/L (ref 3.5–5.1)
Sodium: 140 mmol/L (ref 135–145)

## 2024-10-20 LAB — CBC
HCT: 31.2 % — ABNORMAL LOW (ref 36.0–46.0)
Hemoglobin: 10 g/dL — ABNORMAL LOW (ref 12.0–15.0)
MCH: 27.6 pg (ref 26.0–34.0)
MCHC: 32.1 g/dL (ref 30.0–36.0)
MCV: 86.2 fL (ref 80.0–100.0)
Platelets: 297 K/uL (ref 150–400)
RBC: 3.62 MIL/uL — ABNORMAL LOW (ref 3.87–5.11)
RDW: 14.6 % (ref 11.5–15.5)
WBC: 7.7 K/uL (ref 4.0–10.5)
nRBC: 0 % (ref 0.0–0.2)

## 2024-10-20 LAB — MAGNESIUM: Magnesium: 2 mg/dL (ref 1.7–2.4)

## 2024-10-20 MED ORDER — SODIUM CHLORIDE 0.9 % IV BOLUS
500.0000 mL | Freq: Once | INTRAVENOUS | Status: AC
  Administered 2024-10-20: 500 mL via INTRAVENOUS

## 2024-10-20 NOTE — Progress Notes (Signed)
 Physical Therapy Treatment  Patient Details Name: Laura [name] MRN: [id number] DOB: 07-Mar-1966 Today's Date: 10/20/2024   History of Present Illness 58 y.o. female presents to [facility] hospital on 10/16/2024 for elective R TKA. Post-op hypokalemia. PMH includes hypothyroidism, HTN, HLD, fibromyalgia, GAD, CKD, depression.    PT Comments  Pt progressing slowly towards physical therapy goals. Continues to have difficulty with quad activation, and required active assist to complete exercises. Pt continues to complain of patellar pain, reporting it feels like my knee cap is going to break; it feels like my leg is going to snap in half. Utilized knee immobilizer during session. Overall pt required CGA to min assist with RW for functional mobility. Will continue to follow.    If plan is discharge home, recommend the following: A little help with bathing/dressing/bathroom;Assistance with cooking/housework;Assist for transportation;Help with stairs or ramp for entrance;A lot of help with walking and/or transfers   Can travel by private vehicle        Equipment Recommendations  Rolling walker (2 wheels);BSC/3in1    Recommendations for Other Services OT consult     Precautions / Restrictions Precautions Precautions: Fall;Knee Precaution Booklet Issued: Yes (comment) Recall of Precautions/Restrictions: Intact Precaution/Restrictions Comments: consistent dizziness with standing/gait Required Braces or Orthoses: Knee Immobilizer - Right Knee Immobilizer - Right: On when out of bed or walking Restrictions Weight Bearing Restrictions Per Provider Order: Yes RLE Weight Bearing Per Provider Order: Weight bearing as tolerated     Mobility  Bed Mobility Overal bed mobility: Needs Assistance Bed Mobility: Supine to Sit     Supine to sit: Min assist     General bed mobility comments: PT assisting to support RLE however pt moved well to transition fully to EOB. Anxiety surrounding PT letting  the RLE drop. Support provided throughout transfer and assisted to lower RLE down to floor once on EOB.    Transfers Overall transfer level: Needs assistance Equipment used: Rolling walker (2 wheels) Transfers: Sit to/from Stand Sit to Stand: Min assist           General transfer comment: Light assist for power up to full stand. Good controlled lower down to toilet and recliner.    Ambulation/Gait Ambulation/Gait assistance: Contact guard assist Gait Distance (Feet): 50 Feet Assistive device: Rolling walker (2 wheels) Gait Pattern/deviations: Step-to pattern, Decreased step length - left, Decreased weight shift to right, Trunk flexed, Wide base of support Gait velocity: Decreased Gait velocity interpretation: <1.8 ft/sec, indicate of risk for recurrent falls   General Gait Details: Pt with heavy reliance on RW with UE's. VC's for improved posture, increased heel strike, and sequencing with the RW.   Stairs             Wheelchair Mobility     Tilt Bed    Modified Rankin (Stroke Patients Only)       Balance Overall balance assessment: Needs assistance Sitting-balance support: No upper extremity supported, Feet supported Sitting balance-Leahy Scale: Fair     Standing balance support: Bilateral upper extremity supported, Reliant on assistive device for balance, During functional activity Standing balance-Leahy Scale: Poor                              Communication Communication Communication: No apparent difficulties  Cognition Arousal: Alert Behavior During Therapy: WFL for tasks assessed/performed, Flat affect   PT - Cognitive impairments: No apparent impairments  Following commands: Intact      Cueing Cueing Techniques: Verbal cues, Gestural cues  Exercises Total Joint Exercises Ankle Circles/Pumps: 10 reps, AROM, Both Quad Sets: 10 reps, AROM, Right Short Arc Quad: 10 reps, AAROM, Right Heel  Slides: 10 reps, AAROM, Right Hip ABduction/ADduction: 10 reps, AAROM, Right Goniometric ROM: Grossly ~50-55 AAROM in supine.    General Comments        Pertinent Vitals/Pain Pain Assessment Pain Assessment: Faces Faces Pain Scale: Hurts whole lot Pain Location: R knee Pain Descriptors / Indicators: Grimacing, Operative site guarding, Sharp, Throbbing Pain Intervention(s): Limited activity within patient's tolerance, Monitored during session, Repositioned    Home Living                          Prior Function            PT Goals (current goals can now be found in the care plan section) Acute Rehab PT Goals Patient Stated Goal: to return to independence PT Goal Formulation: With patient/family Time For Goal Achievement: 10/20/24 Potential to Achieve Goals: Good Progress towards PT goals: Progressing toward goals    Frequency    7X/week      PT Plan      Co-evaluation              AM-PAC PT 6 Clicks Mobility   Outcome Measure  Help needed turning from your back to your side while in a flat bed without using bedrails?: A Little Help needed moving from lying on your back to sitting on the side of a flat bed without using bedrails?: A Little Help needed moving to and from a bed to a chair (including a wheelchair)?: A Little Help needed standing up from a chair using your arms (e.g., wheelchair or bedside chair)?: A Little Help needed to walk in hospital room?: A Little Help needed climbing 3-5 steps with a railing? : A Lot 6 Click Score: 17    End of Session Equipment Utilized During Treatment: Gait belt;Right knee immobilizer Activity Tolerance: Patient limited by fatigue;Patient limited by pain Patient left: in chair;with call bell/phone within reach Nurse Communication: Mobility status PT Visit Diagnosis: Other abnormalities of gait and mobility (R26.89);Muscle weakness (generalized) (M62.81);Pain Pain - Right/Left: Right Pain - part of  body: Knee     Time: 1010-1056 PT Time Calculation (min) (ACUTE ONLY): 46 min  Charges:    $Gait Training: 8-22 mins $Therapeutic Exercise: 8-22 mins $Therapeutic Activity: 8-22 mins PT General Charges $$ ACUTE PT VISIT: 1 Visit                     Leita Sable, PT, DPT Acute Rehabilitation Services Secure Chat Preferred Office: [phone]    Leita JONETTA Sable 10/20/2024, 12:53 PM

## 2024-10-20 NOTE — Progress Notes (Signed)
 "  CONSULT F/U NOTE  Laura [name]  FMW:[id number] DOB: Dec 30, 1965 DOA: 10/16/2024 PCP: Milon Cleaves, PA    Brief Narrative:  58 year old with an extensive medical history as noted below who was admitted to the hospital by Dr. 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 Her postoperative course has been complicated by severe refractory hypokalemia, for which I was consulted. 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 She is aware that chlorthalidone  and Lasix  both can contribute to hypokalemia but states that she developed severe peripheral edema in the absence of these medications. She reports that her hypokalemia is usually able to be managed by high-dose supplemental potassium at home but that she is prone to exacerbations during episodes of acute illness such as at present. She denies chest pain shortness of breath nausea vomiting or abdominal pain at present.   Goals of Care:   Code Status: Full Code   Interim Hx: No acute events reported overnight.  Is now making better progress with mobility. No new medical issues at the time of my visit.   Assessment & Plan:  Familial hypokalemia NOS - severe acute hypokalemia  Resuming her usual doses of chlorthalidone  and Lasix  ftoday - improving nicely with aggressive oral supplementation augmented with IV supplementation - continue with oral supplementation alone now  Hypomagnesemia Likely due to poor oral intake and use of diuretics - supplemented to goal of 2.0   Anxiety/depression Continue usual outpatient medications   DM2 Appears well-controlled at present - no changes recommended in her treatment regimen    Hashimoto's resulting in hypothyroidism Continue usual Synthroid  supplementation   Disposition: The patient is medically stable for disposition at the discretion of the primary service at this time -  she should return to her usual home medication regimen at the time of discharge, including her prior usual potassium replacement    Objective: Blood pressure 109/70, pulse 86, temperature 98.2 F (36.8 C), temperature source Oral, resp. rate 16, height 5' 5 (1.651 m), weight 82.6 kg, SpO2 97%.  Intake/Output Summary (Last 24 hours) at 10/20/2024 1700 Last data filed at 10/20/2024 1600 Gross per 24 hour  Intake 740 ml  Output --  Net 740 ml   Filed Weights   10/16/24 0722  Weight: 82.6 kg    Examination: General: No acute respiratory distress Lungs: Clear to auscultation bilaterally  Cardiovascular: Regular rate and rhythm without murmur  Abdomen: NT/ND, soft, BS+ Extremities: No significant edema B LE    Basic Metabolic Panel: Recent Labs  Lab 10/18/24 1050 10/18/24 1743 10/19/24 0431 10/20/24 0636 10/20/24 0912  NA 135 138 138  --  140  K 2.8* 3.0* 3.5  --  4.1  CL 90* 94* 98  --  103  CO2 33* 33* 33*  --  30  GLUCOSE 160* 122* 127*  --  147*  BUN 11 10 9   --  13  CREATININE 0.63 0.59 0.61  --  0.53  CALCIUM  9.0 8.8* 8.7*  --  8.8*  MG 1.7  --  2.2 2.0  --    GFR: Estimated Creatinine Clearance: 80.3 mL/min (by C-G formula based on SCr of 0.53 mg/dL).   Scheduled Meds:  ALPRAZolam   1 mg Oral TID   aspirin   81 mg Oral BID   budesonide -glycopyrrolate -formoterol   2 puff Inhalation BID   carvedilol   25 mg Oral BID WC   And  carvedilol   6.25 mg Oral Q1200   chlorthalidone   12.5 mg Oral q morning   docusate sodium   100 mg Oral BID   famotidine   20 mg Oral BID   furosemide   20 mg Oral Q12H   levothyroxine   88 mcg Oral Daily   naproxen   250 mg Oral BID WC   polyethylene glycol  17 g Oral BID   potassium chloride   40 mEq Oral QID   prazosin   2 mg Oral BID   zolpidem   5 mg Oral QHS      LOS: 2 days   Reyes IVAR Moores, MD [facility] Office  [phone] Pager - Text Page per Tracey  If 7PM-7AM, please contact night-coverage per  Amion 10/20/2024, 5:00 PM     "

## 2024-10-20 NOTE — Plan of Care (Signed)
" °  Problem: Education: Goal: Knowledge of General Education information will improve Description: Including pain rating scale, medication(s)/side effects and non-pharmacologic comfort measures Outcome: Progressing   Problem: Health Behavior/Discharge Planning: Goal: Ability to manage health-related needs will improve Outcome: Progressing   Problem: Activity: Goal: Risk for activity intolerance will decrease Outcome: Progressing   Problem: Nutrition: Goal: Adequate nutrition will be maintained Outcome: Progressing   Problem: Elimination: Goal: Will not experience complications related to bowel motility Outcome: Progressing   Problem: Pain Managment: Goal: General experience of comfort will improve and/or be controlled Outcome: Progressing   Problem: Safety: Goal: Ability to remain free from injury will improve Outcome: Progressing   "

## 2024-10-20 NOTE — Evaluation (Signed)
 Occupational Therapy Evaluation Patient Details Name: Laura [name] MRN: [id number] DOB: 1966/07/04 Today's Date: 10/20/2024   History of Present Illness   58 y.o. female presents to [facility] hospital on 10/16/2024 for elective R TKA. Post-op hypokalemia. PMH includes hypothyroidism, HTN, HLD, fibromyalgia, GAD, CKD, depression.     Clinical Impressions PTA Pt was independent with ADL/IADLs and functional mobility. Pt currently requires up to Min A for functional transfers and Mod A for LB ADL task engagement. Pt primarily limited by R knee pain, decreased activity tolerance, generalized weakness, and decreased knowledge of AE. Pt will benefit from continued OT services in acute care to facilitate progress towards goals and educate Pt on AE for LB ADLs. Anticipate that Pt will be able to return home with level of assist as outlined below without follow-up OT.      If plan is discharge home, recommend the following:   A little help with walking and/or transfers;A little help with bathing/dressing/bathroom;Assistance with cooking/housework;Assist for transportation;Help with stairs or ramp for entrance     Functional Status Assessment   Patient has had a recent decline in their functional status and demonstrates the ability to make significant improvements in function in a reasonable and predictable amount of time.     Equipment Recommendations   BSC/3in1;Other (comment) (rolling walker)     Recommendations for Other Services         Precautions/Restrictions   Precautions Precautions: Fall;Knee Precaution Booklet Issued: Yes (comment) Recall of Precautions/Restrictions: Intact Precaution/Restrictions Comments: consistent dizziness with standing/gait Required Braces or Orthoses: Knee Immobilizer - Right Knee Immobilizer - Right: On when out of bed or walking Restrictions Weight Bearing Restrictions Per Provider Order: Yes RLE Weight Bearing Per Provider Order: Weight  bearing as tolerated     Mobility Bed Mobility Overal bed mobility: Needs Assistance Bed Mobility: Supine to Sit     Supine to sit: Min assist     General bed mobility comments: Min A management of RLE off of bed and slowly lowered onto floor. Anxiety surrounding OT letting the RLE drop.    Transfers Overall transfer level: Needs assistance Equipment used: Rolling walker (2 wheels) Transfers: Sit to/from Stand Sit to Stand: Contact guard assist           General transfer comment: CGA to rise from bed. Ambulated to commode in bathroom with CGA then to sink and recliner. Slow shuffling steps noted.      Balance Overall balance assessment: Needs assistance Sitting-balance support: No upper extremity supported, Feet supported Sitting balance-Leahy Scale: Fair     Standing balance support: Bilateral upper extremity supported, Single extremity supported, During functional activity, Reliant on assistive device for balance Standing balance-Leahy Scale: Poor Standing balance comment: Dependent on RW. UE propping on counter for grooming tasks at sink .                           ADL either performed or assessed with clinical judgement   ADL Overall ADL's : Needs assistance/impaired Eating/Feeding: Independent;Sitting   Grooming: Supervision/safety;Oral care;Wash/dry hands;Standing   Upper Body Bathing: Independent;Sitting   Lower Body Bathing: Moderate assistance;Sitting/lateral leans   Upper Body Dressing : Independent   Lower Body Dressing: Moderate assistance Lower Body Dressing Details (indicate cue type and reason): Mod A to don/doff clohing RLE. Able to don sock on LLE in long sitting position in bed Toilet Transfer: Contact guard assist;Ambulation;Regular Toilet;Rolling walker (2 wheels);Grab bars   Toileting- Clothing Manipulation and  Hygiene: Independent;Sitting/lateral lean               Vision Patient Visual Report: No change from  baseline       Perception         Praxis         Pertinent Vitals/Pain Pain Assessment Pain Assessment: 0-10 Pain Score: 4  Pain Location: R knee Pain Descriptors / Indicators: Grimacing, Guarding Pain Intervention(s): Limited activity within patient's tolerance, Monitored during session, Repositioned     Extremity/Trunk Assessment Upper Extremity Assessment Upper Extremity Assessment: RUE deficits/detail RUE Deficits / Details: Rotator cuff injury 2023 resulting in residual decreased ROM of R shoulder   Lower Extremity Assessment Lower Extremity Assessment: Defer to PT evaluation   Cervical / Trunk Assessment Cervical / Trunk Assessment: Normal   Communication Communication Communication: No apparent difficulties   Cognition Arousal: Alert Behavior During Therapy: WFL for tasks assessed/performed Cognition: No apparent impairments                               Following commands: Intact       Cueing  General Comments   Cueing Techniques: Verbal cues;Visual cues  Educated Pt on LB AE for bathing and dressing. Visual demonstration of sock aid, long handled sponge, and reacher.   Exercises     Shoulder Instructions      Home Living Family/patient expects to be discharged to:: Private residence Living Arrangements: Alone Available Help at Discharge: Family Type of Home: House Home Access: Stairs to enter Secretary/administrator of Steps: 4 Entrance Stairs-Rails: None Home Layout: One level     Bathroom Shower/Tub: Producer, Television/film/video: Standard     Home Equipment: None   Additional Comments: Will be staying at daughters after d/c, she has WIS      Prior Functioning/Environment Prior Level of Function : Independent/Modified Independent;Driving;Working/employed             Mobility Comments: Independent ADLs Comments: Independent, works 2 days a week at [employer].    OT Problem List: Decreased  strength;Decreased activity tolerance;Impaired balance (sitting and/or standing);Decreased knowledge of use of DME or AE;Pain   OT Treatment/Interventions: Self-care/ADL training;Therapeutic exercise;Energy conservation;DME and/or AE instruction;Therapeutic activities;Patient/family education;Balance training      OT Goals(Current goals can be found in the care plan section)   Acute Rehab OT Goals Patient Stated Goal: to go home OT Goal Formulation: With patient Time For Goal Achievement: 11/03/24 Potential to Achieve Goals: Good ADL Goals Pt Will Perform Grooming: with modified independence;standing Pt Will Perform Lower Body Dressing: with modified independence;with adaptive equipment Pt Will Transfer to Toilet: with modified independence;ambulating;regular height toilet Additional ADL Goal #1: Pt will engage in bed mobility with supervision as a precursor to engagement in ADL tasks OOB.   OT Frequency:  Min 1X/week    Co-evaluation              AM-PAC OT 6 Clicks Daily Activity     Outcome Measure Help from another person eating meals?: None Help from another person taking care of personal grooming?: A Little Help from another person toileting, which includes using toliet, bedpan, or urinal?: A Little Help from another person bathing (including washing, rinsing, drying)?: A Lot Help from another person to put on and taking off regular upper body clothing?: None Help from another person to put on and taking off regular lower body clothing?: A Lot 6 Click Score: 18  End of Session Equipment Utilized During Treatment: Rolling walker (2 wheels) CPM Right Knee CPM Right Knee: Off  Activity Tolerance: Patient tolerated treatment well Patient left: in chair;with call bell/phone within reach;with chair alarm set  OT Visit Diagnosis: Unsteadiness on feet (R26.81);Muscle weakness (generalized) (M62.81);Pain Pain - Right/Left: Right Pain - part of body: Knee                 Time: 8680-8652 OT Time Calculation (min): 28 min Charges:  OT General Charges $OT Visit: 1 Visit OT Evaluation $OT Eval Low Complexity: 1 Low OT Treatments $Self Care/Home Management : 8-22 mins  Maurilio CROME, OTR/L.  [facility]  Office: [phone]   Maurilio PARAS Keenon Leitzel 10/20/2024, 2:02 PM

## 2024-10-20 NOTE — Progress Notes (Signed)
 Physical Therapy Treatment Patient Details Name: Laura [name] MRN: [id number] DOB: Oct 16, 1966 Today's Date: 10/20/2024   History of Present Illness 58 y.o. female presents to [facility] hospital on 10/16/2024 for elective R TKA. Post-op hypokalemia. PMH includes hypothyroidism, HTN, HLD, fibromyalgia, GAD, CKD, depression.    PT Comments  Pt received in supine, agreeable to therapy session and with good participation and fair tolerance for transfer, gait and stair training. Pt negotiated stairs with bil rails as she reports her rails are being fixed, plan to clarify rail set-up after she discusses this with the people who are doing the work. Pt needing up to minA for bed mobility and transfers as pt unable to tolerate RLE in dependent posture and quad lag (with KI donned) causing too much pain for her to perform straight leg raise when lowering recliner/lifting on to bed from EOB. RN/MD notified of pt symptomatic orthostatic hypotension with sit>stand this date and not able to ambulate >67ft in afternoon session due to symptoms, pt needed chair follow for safety. Pt reports she still plans to DC home alone, she may need to look into hiring private caregivers vs requesting assist around the clock from friends/family upon DC until she is able to don/doff her KI on her own and perform unassisted transfers.   Patient Position (if appropriate) Orthostatic Vitals (MD McClung and RN notified of symptoms with standing)  Orthostatic Sitting  BP- Sitting 111/73 (recliner, MAP (85))  Pulse- Sitting 83  Orthostatic Standing at 0 minutes  BP- Standing at 0 minutes 91/72 ((79))  Pulse- Standing at 0 minutes 88  Orthostatic Standing at 3 minutes  BP- Standing at 3 minutes  (pt unable to tolerate 3 min standing BP due to dizziness/feeling hot)     If plan is discharge home, recommend the following: A little help with bathing/dressing/bathroom;Assistance with cooking/housework;Assist for transportation;Help  with stairs or ramp for entrance;A lot of help with walking and/or transfers   Can travel by private vehicle        Equipment Recommendations  Rolling walker (2 wheels);BSC/3in1    Recommendations for Other Services       Precautions / Restrictions Precautions Precautions: Fall;Knee Precaution Booklet Issued: Yes (comment) Recall of Precautions/Restrictions: Intact Precaution/Restrictions Comments: consistent dizziness with standing/gait Required Braces or Orthoses: Knee Immobilizer - Right Knee Immobilizer - Right: On when out of bed or walking Restrictions Weight Bearing Restrictions Per Provider Order: Yes RLE Weight Bearing Per Provider Order: Weight bearing as tolerated     Mobility  Bed Mobility Overal bed mobility: Needs Assistance Bed Mobility: Sit to Supine       Sit to supine: Used rails, Min assist   General bed mobility comments: Min A management of RLE back to bed from EOB, pt reports unable to sequence lifting limb with gait belt on her own, KI doffed after pt returned to supine with totalA needed to doff brace.    Transfers Overall transfer level: Needs assistance Equipment used: Rolling walker (2 wheels) Transfers: Sit to/from Stand Sit to Stand: Contact guard assist, Min assist           General transfer comment: CGA from chair>RW, but minA for stand>sit to chair when her orthostatic symptoms increased, and pt requesting PTA hold her RLE up while scooting back into and elevating recliner leg rest, pt reports using LLE hooked under RLE is not an option for her with this.    Ambulation/Gait Ambulation/Gait assistance: Contact guard assist, +2 safety/equipment Gait Distance (Feet): 20 Feet Assistive  device: Rolling walker (2 wheels) Gait Pattern/deviations: Step-to pattern, Decreased step length - left, Decreased weight shift to right, Trunk flexed, Wide base of support Gait velocity: Decreased Gait velocity interpretation: <1.8 ft/sec, indicate of  risk for recurrent falls   General Gait Details: Pt with heavy reliance on RW with UE's. VC's for improved posture, increased heel strike, and sequencing with the RW. Chair follow for safety as pt reports fatigue from working with OT just prior to PT session. Pt with symptomatic orthostatic hypotension (feeling hot/dizzy) and had to sit down after 35ft, when BP checked standing to pivot back to bed, pt observed to have 20 point SBP drop, although MAP did not decrease much, MD/RN notified of BP readings and pt symptoms.   Stairs Stairs: Yes Stairs assistance: Contact guard assist Stair Management: Step to pattern, Forwards, Two rails Number of Stairs: 3 General stair comments: 4 steps in PT gym wtih bil rails, pt reports her church congregation is fixing the rails for her stairs for entering her mobile home, pt is unsure if she will have bil rails similar to PT gym set-up, will plan to confirm set-up of new rails with patient after she speaks with the people fixing her rails.   Wheelchair Mobility     Tilt Bed    Modified Rankin (Stroke Patients Only)       Balance Overall balance assessment: Needs assistance Sitting-balance support: No upper extremity supported, Feet supported Sitting balance-Leahy Scale: Fair     Standing balance support: Bilateral upper extremity supported, Single extremity supported, During functional activity, Reliant on assistive device for balance Standing balance-Leahy Scale: Poor Standing balance comment: Dependent on RW                            Communication Communication Communication: No apparent difficulties  Cognition Arousal: Alert Behavior During Therapy: WFL for tasks assessed/performed   PT - Cognitive impairments: No apparent impairments                       PT - Cognition Comments: Toward end of session pt very fatigue and was unable to recall name of the church she attends, although her pastor visited a day or  two ago and pt was able to discuss all the help the other members had been providing. Possibly word finding deficit related to orthostatic symptoms/fatigue level as she has not had pain meds since 11am. Following commands: Intact      Cueing Cueing Techniques: Verbal cues, Visual cues  Exercises      General Comments General comments (skin integrity, edema, etc.): incision appears c/d/i, some mild pedal edema; pt defers iceman, blue bone foam or CPM once in supine due to pain, pt encouraged to notify nursing staff when she is able to have them put back on, nursing staff also notified.      Pertinent Vitals/Pain Pain Assessment Pain Assessment: Faces Faces Pain Scale: Hurts even more Pain Location: R knee with sit<>stand and post-ambulation in bed Pain Descriptors / Indicators: Grimacing, Operative site guarding, Sharp, Sore Pain Intervention(s): Limited activity within patient's tolerance, Monitored during session, Premedicated before session, Repositioned, Other (comment) (pt defers iceman, too cold right now)    Home Living Family/patient expects to be discharged to:: Private residence Living Arrangements: Alone Available Help at Discharge: Family Type of Home: House Home Access: Stairs to enter Entrance Stairs-Rails: None Entrance Stairs-Number of Steps: 4   Home Layout: One  level Home Equipment: None Additional Comments: Will be staying at daughters after d/c, she has WIS    Prior Function            PT Goals (current goals can now be found in the care plan section) Acute Rehab PT Goals Patient Stated Goal: to return to independence PT Goal Formulation: With patient/family Time For Goal Achievement: 11/03/24 Potential to Achieve Goals: Fair Progress towards PT goals: Progressing toward goals    Frequency    7X/week      PT Plan      Co-evaluation              AM-PAC PT 6 Clicks Mobility   Outcome Measure  Help needed turning from your back to  your side while in a flat bed without using bedrails?: A Little Help needed moving from lying on your back to sitting on the side of a flat bed without using bedrails?: A Little Help needed moving to and from a bed to a chair (including a wheelchair)?: A Little Help needed standing up from a chair using your arms (e.g., wheelchair or bedside chair)?: A Little Help needed to walk in hospital room?: A Lot (chair follow due to hypotension standing) Help needed climbing 3-5 steps with a railing? : A Little (may need +2 safety if still dizzy) 6 Click Score: 17    End of Session Equipment Utilized During Treatment: Gait belt;Right knee immobilizer Activity Tolerance: Patient tolerated treatment well;Patient limited by pain;Treatment limited secondary to medical complications (Comment);Other (comment) (symptomatic drop in BP with standing) Patient left: in bed;with call bell/phone within reach;with bed alarm set;Other (comment) (pt refusing iceman/CPM) Nurse Communication: Mobility status;Precautions;Other (comment) (drop in BP standing; pt defers iceman/CPM/chair until later) PT Visit Diagnosis: Other abnormalities of gait and mobility (R26.89);Muscle weakness (generalized) (M62.81);Pain Pain - Right/Left: Right Pain - part of body: Knee     Time: 8650-8575 PT Time Calculation (min) (ACUTE ONLY): 35 min  Charges:    $Gait Training: 8-22 mins $Therapeutic Activity: 8-22 mins PT General Charges $$ ACUTE PT VISIT: 1 Visit                     Kymora Sciara P., PTA Acute Rehabilitation Services Secure Chat Preferred 9a-5:30pm Office: [phone]    Connell HERO [facility] 10/20/2024, 3:28 PM

## 2024-10-20 NOTE — Progress Notes (Signed)
" °  Subjective: Pt stable - walked in hall   Objective: Vital signs in last 24 hours: Temp:  [97.5 F (36.4 C)-98.8 F (37.1 C)] 97.5 F (36.4 C) (12/20 0753) Pulse Rate:  [75-88] 75 (12/20 0753) Resp:  [16-18] 16 (12/20 0753) BP: (100-119)/(60-74) 119/73 (12/20 0753) SpO2:  [93 %-98 %] 97 % (12/20 0753)  Intake/Output from previous day: 12/19 0701 - 12/20 0700 In: 480 [P.O.:480] Out: -  Intake/Output this shift: No intake/output data recorded.  Exam:  Sensation intact distally Intact pulses distally Dorsiflexion/Plantar flexion intact  Labs: No results for input(s): HGB in the last 72 hours. No results for input(s): WBC, RBC, HCT, PLT in the last 72 hours. Recent Labs    10/18/24 1743 10/19/24 0431  NA 138 138  K 3.0* 3.5  CL 94* 98  CO2 33* 33*  BUN 10 9  CREATININE 0.59 0.61  GLUCOSE 122* 127*  CALCIUM  8.8* 8.7*   No results for input(s): LABPT, INR in the last 72 hours.  Assessment/Plan: Plan pt today - decision for home with hhpt vs snf monday   Laura [name] 10/20/2024, 8:19 AM     "

## 2024-10-21 LAB — CBC
HCT: 31 % — ABNORMAL LOW (ref 36.0–46.0)
Hemoglobin: 10.2 g/dL — ABNORMAL LOW (ref 12.0–15.0)
MCH: 27.9 pg (ref 26.0–34.0)
MCHC: 32.9 g/dL (ref 30.0–36.0)
MCV: 84.7 fL (ref 80.0–100.0)
Platelets: 312 K/uL (ref 150–400)
RBC: 3.66 MIL/uL — ABNORMAL LOW (ref 3.87–5.11)
RDW: 14.7 % (ref 11.5–15.5)
WBC: 6.5 K/uL (ref 4.0–10.5)
nRBC: 0 % (ref 0.0–0.2)

## 2024-10-21 LAB — BASIC METABOLIC PANEL WITH GFR
Anion gap: 10 (ref 5–15)
BUN: 13 mg/dL (ref 6–20)
CO2: 26 mmol/L (ref 22–32)
Calcium: 9.9 mg/dL (ref 8.9–10.3)
Chloride: 103 mmol/L (ref 98–111)
Creatinine, Ser: 0.51 mg/dL (ref 0.44–1.00)
GFR, Estimated: >60 mL/min
Glucose, Bld: 125 mg/dL — ABNORMAL HIGH (ref 70–99)
Potassium: 3.8 mmol/L (ref 3.5–5.1)
Sodium: 139 mmol/L (ref 135–145)

## 2024-10-21 LAB — MAGNESIUM: Magnesium: 1.6 mg/dL — ABNORMAL LOW (ref 1.7–2.4)

## 2024-10-21 MED ORDER — DIPHENHYDRAMINE HCL 25 MG PO CAPS
25.0000 mg | ORAL_CAPSULE | Freq: Four times a day (QID) | ORAL | Status: DC | PRN
  Administered 2024-10-21: 25 mg via ORAL
  Filled 2024-10-21: qty 1

## 2024-10-21 MED ORDER — MAGNESIUM SULFATE 4 GM/100ML IV SOLN
4.0000 g | Freq: Once | INTRAVENOUS | Status: AC
  Administered 2024-10-21: 4 g via INTRAVENOUS
  Filled 2024-10-21: qty 100

## 2024-10-21 MED ORDER — CARVEDILOL 12.5 MG PO TABS
12.5000 mg | ORAL_TABLET | Freq: Two times a day (BID) | ORAL | Status: DC
  Administered 2024-10-21 – 2024-10-22 (×2): 12.5 mg via ORAL
  Filled 2024-10-21 (×2): qty 1

## 2024-10-21 MED ORDER — SODIUM CHLORIDE 0.9 % IV BOLUS
250.0000 mL | Freq: Once | INTRAVENOUS | Status: AC
  Administered 2024-10-21: 250 mL via INTRAVENOUS

## 2024-10-21 MED ORDER — MAGNESIUM GLUCONATE 500 (27 MG) MG PO TABS
500.0000 mg | ORAL_TABLET | Freq: Two times a day (BID) | ORAL | Status: DC
  Administered 2024-10-21 – 2024-10-22 (×3): 500 mg via ORAL
  Filled 2024-10-21 (×4): qty 1

## 2024-10-21 NOTE — Progress Notes (Addendum)
 "  CONSULT F/U NOTE  Laura [name]  FMW:[id number] DOB: [birth year] DOA: 10/16/2024 PCP: Milon Cleaves, PA    Brief Narrative:  58 year old with an extensive medical history as noted below who was admitted to the hospital by Dr. 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 Her postoperative course has been complicated by severe refractory hypokalemia, for which I was consulted. 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 She is aware that chlorthalidone  and Lasix  both can contribute to hypokalemia but states that she developed severe peripheral edema in the absence of these medications. She reports that her hypokalemia is usually able to be managed by high-dose supplemental potassium at home but that she is prone to exacerbations during episodes of acute illness such as at present. She denies chest pain shortness of breath nausea vomiting or abdominal pain at present.   Goals of Care:   Code Status: Full Code   Interim Hx: Patient experienced some orthostatic symptoms when working with physical therapy yesterday afternoon.  She continues to refuse to consider SNF rehab placement.  No other acute events reported overnight.  Afebrile.  Vital signs stable.  Potassium stable. Magnesium  slightly low today but not dangerously so.  Assessment & Plan:  Familial hypokalemia NOS - severe acute hypokalemia  Resumed her usual doses of chlorthalidone  and Lasix  12/20 - improving nicely with aggressive oral supplementation augmented with IV supplementation - continue with oral supplementation alone - I have recommended that she stop chlorthalidone  and lasix , but she is not willing to do so  Hypomagnesemia Likely due to poor oral intake and use of diuretics - cont to supplemented to goal of 2.0  Orthostasis Stop diuretics for now - lower dose of coreg  - stop prazosin  for now    Anxiety/depression Continue usual outpatient medications   DM2 Appears well-controlled at present - no changes recommended in her treatment regimen    Hashimoto's resulting in hypothyroidism Continue usual Synthroid  supplementation   Disposition: The patient is medically stable for disposition at the discretion of the primary service at this time - she should return to her usual home medication regimen at the time of discharge, including her prior usual potassium replacement    Objective: Blood pressure 132/73, pulse 76, temperature (!) 97.5 F (36.4 C), resp. rate 16, height 5' 5 (1.651 m), weight 82.6 kg, SpO2 96%.  Intake/Output Summary (Last 24 hours) at 10/21/2024 9077 Last data filed at 10/20/2024 1600 Gross per 24 hour  Intake 500 ml  Output --  Net 500 ml   Filed Weights   10/16/24 0722  Weight: 82.6 kg    Examination: Will visit patient for f/u visit tomorrow.     Basic Metabolic Panel: Recent Labs  Lab 10/19/24 0431 10/20/24 0636 10/20/24 0912 10/21/24 0605  NA 138  --  140 139  K 3.5  --  4.1 3.8  CL 98  --  103 103  CO2 33*  --  30 26  GLUCOSE 127*  --  147* 125*  BUN 9  --  13 13  CREATININE 0.61  --  0.53 0.51  CALCIUM  8.7*  --  8.8* 9.9  MG 2.2 2.0  --  1.6*   GFR: Estimated Creatinine Clearance: 80.3 mL/min (by C-G formula based on SCr of 0.51 mg/dL).   Scheduled Meds:  ALPRAZolam   1 mg Oral TID   aspirin   81 mg Oral BID  budesonide -glycopyrrolate -formoterol   2 puff Inhalation BID   carvedilol   25 mg Oral BID WC   And   carvedilol   6.25 mg Oral Q1200   chlorthalidone   12.5 mg Oral q morning   docusate sodium   100 mg Oral BID   famotidine   20 mg Oral BID   furosemide   20 mg Oral Q12H   levothyroxine   88 mcg Oral Daily   naproxen   250 mg Oral BID WC   polyethylene glycol  17 g Oral BID   potassium chloride   40 mEq Oral QID   prazosin   2 mg Oral BID   zolpidem   5 mg Oral QHS      LOS: 3 days   Reyes IVAR Moores, MD [facility] Office  [phone] Pager - Text Page per Tracey  If 7PM-7AM, please contact night-coverage per Amion 10/21/2024, 9:22 AM     "

## 2024-10-21 NOTE — Progress Notes (Signed)
 Occupational Therapy Treatment Patient Details Name: Laura [name] MRN: [id number] DOB: May 02, 1966 Today's Date: 10/21/2024   History of present illness 58 y.o. female presents to [facility] hospital on 10/16/2024 for elective R TKA. Post-op hypokalemia. PMH includes hypothyroidism, HTN, HLD, fibromyalgia, GAD, CKD, depression.   OT comments  Pt progressing well towards OT goals. Focus of session on increasing independence with ADL tasks, education on AE, and progressing activity tolerance for OOB tasks. Pt required up to Min A for functional transfers this session, as well as Min A for LB ADL tasks with use of AE. Pt continues to benefit from acute skilled OT services. Continue per POC.       If plan is discharge home, recommend the following:  A little help with walking and/or transfers;A little help with bathing/dressing/bathroom;Assistance with cooking/housework;Assist for transportation;Help with stairs or ramp for entrance   Equipment Recommendations  BSC/3in1;Other (comment) (rolling walker)    Recommendations for Other Services      Precautions / Restrictions Precautions Precautions: Fall;Knee Precaution Booklet Issued: Yes (comment) Recall of Precautions/Restrictions: Intact Precaution/Restrictions Comments: orthostasis 12/20 Required Braces or Orthoses: Knee Immobilizer - Right Knee Immobilizer - Right: On when out of bed or walking Restrictions Weight Bearing Restrictions Per Provider Order: Yes RLE Weight Bearing Per Provider Order: Weight bearing as tolerated       Mobility Bed Mobility Overal bed mobility: Needs Assistance Bed Mobility: Sit to Supine, Supine to Sit     Supine to sit: Min assist Sit to supine: Min assist   General bed mobility comments: Light Min A for bed mobility for management of RLE. Pt with improved tolerance to mobility this session.    Transfers Overall transfer level: Needs assistance Equipment used: None Transfers: Sit to/from  Stand Sit to Stand: Supervision           General transfer comment: Supervision to rise from bed, Pt requested attempt without RW. Pt able to take four side steps to the R towards [facility] with +1 HHA.     Balance Overall balance assessment: Needs assistance Sitting-balance support: No upper extremity supported, Feet supported Sitting balance-Leahy Scale: Good     Standing balance support: Single extremity supported, During functional activity, Reliant on assistive device for balance Standing balance-Leahy Scale: Poor Standing balance comment: Dependent on assistance for standing balance                           ADL either performed or assessed with clinical judgement   ADL Overall ADL's : Needs assistance/impaired             Lower Body Bathing: Minimal assistance;With adaptive equipment Lower Body Bathing Details (indicate cue type and reason): Long handled sponge     Lower Body Dressing: Minimal assistance;With adaptive equipment Lower Body Dressing Details (indicate cue type and reason): Returned demonstration of reacher and sock aid for LB dressing. Continues to require assistance for KI but states she will have daughter assist                    Extremity/Trunk Assessment Upper Extremity Assessment Upper Extremity Assessment: RUE deficits/detail RUE Deficits / Details: Rotator cuff injury 2023 resulting in residual decreased ROM of R shoulder            Vision       Perception     Praxis     Communication Communication Communication: No apparent difficulties   Cognition Arousal: Alert Behavior During  Therapy: WFL for tasks assessed/performed Cognition: No apparent impairments                               Following commands: Intact        Cueing   Cueing Techniques: Verbal cues, Visual cues  Exercises      Shoulder Instructions       General Comments Reinforced education on AE and Pt able to return  demonstration of proper use.    Pertinent Vitals/ Pain       Pain Assessment Pain Assessment: Faces Faces Pain Scale: Hurts whole lot Pain Location: R knee Pain Descriptors / Indicators: Discomfort, Grimacing, Guarding Pain Intervention(s): Limited activity within patient's tolerance, Monitored during session, Repositioned, RN gave pain meds during session  Home Living                                          Prior Functioning/Environment              Frequency  Min 1X/week        Progress Toward Goals  OT Goals(current goals can now be found in the care plan section)  Progress towards OT goals: Progressing toward goals  Acute Rehab OT Goals Patient Stated Goal: to go home OT Goal Formulation: With patient Time For Goal Achievement: 11/03/24 Potential to Achieve Goals: Good ADL Goals Pt Will Perform Grooming: with modified independence;standing Pt Will Perform Lower Body Dressing: with modified independence;with adaptive equipment Pt Will Transfer to Toilet: with modified independence;ambulating;regular height toilet Additional ADL Goal #1: Pt will engage in bed mobility with supervision as a precursor to engagement in ADL tasks OOB.  Plan      Co-evaluation                 AM-PAC OT 6 Clicks Daily Activity     Outcome Measure   Help from another person eating meals?: None Help from another person taking care of personal grooming?: A Little Help from another person toileting, which includes using toliet, bedpan, or urinal?: A Little Help from another person bathing (including washing, rinsing, drying)?: A Little Help from another person to put on and taking off regular upper body clothing?: None Help from another person to put on and taking off regular lower body clothing?: A Little 6 Click Score: 20    End of Session CPM Right Knee CPM Right Knee: Off  OT Visit Diagnosis: Unsteadiness on feet (R26.81);Muscle weakness  (generalized) (M62.81);Pain Pain - Right/Left: Right Pain - part of body: Knee   Activity Tolerance Patient tolerated treatment well   Patient Left in bed;with call bell/phone within reach;with bed alarm set   Nurse Communication Mobility status        Time: 8655-8585 OT Time Calculation (min): 30 min  Charges: OT General Charges $OT Visit: 1 Visit OT Treatments $Self Care/Home Management : 23-37 mins  Maurilio CROME, OTR/L.  [facility] Acute Rehabilitation  Office: [phone]   Maurilio PARAS Samaria Anes 10/21/2024, 3:03 PM

## 2024-10-21 NOTE — Progress Notes (Signed)
 Physical Therapy Treatment Patient Details Name: Laura [name] MRN: [id number] DOB: 09/17/1966 Today's Date: 10/21/2024   History of Present Illness 58 y.o. female presents to [facility] hospital on 10/16/2024 for elective R TKA. Post-op hypokalemia. PMH includes hypothyroidism, HTN, HLD, fibromyalgia, GAD, CKD, depression.    PT Comments  Patient up in recliner on arrival. Reports was given IV pain meds ~45 minutes prior (PT had arranged with RN to have pt pre-medicated prior to session). Discussed discharge plan and she is going to her daughter's home with 3 steps to enter with bil handrails (close together enough she can reach both at the same time). Focused on increasing ambulation distance and ROM exercises. Had pt utilize gait belt as a leg-lifter for raising and lowering footrest of recliner. Patient able to doff KI except for bottom strap required assist (and assist to raise leg to remove brace. End of session remained out of KI as pt reporting skin is itching. Patient able to verbalize that she has to don brace prior to standing. Will plan to review steps this afternoon if pt feels she needs to repeat.     If plan is discharge home, recommend the following: A little help with bathing/dressing/bathroom;Assistance with cooking/housework;Assist for transportation;Help with stairs or ramp for entrance;A lot of help with walking and/or transfers   Can travel by private vehicle        Equipment Recommendations  Rolling walker (2 wheels);BSC/3in1    Recommendations for Other Services       Precautions / Restrictions Precautions Precautions: Fall;Knee Precaution Booklet Issued: Yes (comment) Recall of Precautions/Restrictions: Intact Precaution/Restrictions Comments: orthostasis 12/20 Required Braces or Orthoses: Knee Immobilizer - Right Knee Immobilizer - Right: On when out of bed or walking Restrictions Weight Bearing Restrictions Per Provider Order: Yes RLE Weight Bearing Per Provider  Order: Weight bearing as tolerated     Mobility  Bed Mobility               General bed mobility comments: up in recliner    Transfers Overall transfer level: Needs assistance Equipment used: Rolling walker (2 wheels) Transfers: Sit to/from Stand Sit to Stand: Contact guard assist           General transfer comment: from recliner and comfort height toilet    Ambulation/Gait Ambulation/Gait assistance: Contact guard assist Gait Distance (Feet): 90 Feet Assistive device: Rolling walker (2 wheels) Gait Pattern/deviations: Step-to pattern, Decreased step length - left, Decreased weight shift to right, Trunk flexed, Wide base of support Gait velocity: Decreased     General Gait Details: Pt with lesser reliance on RW with UE's. VC's for improved posture, increased heel strike, and sequencing with the RW. Reported no dizziness with walking   Stairs             Wheelchair Mobility     Tilt Bed    Modified Rankin (Stroke Patients Only)       Balance Overall balance assessment: Needs assistance Sitting-balance support: No upper extremity supported, Feet supported Sitting balance-Leahy Scale: Fair     Standing balance support: Bilateral upper extremity supported, Single extremity supported, During functional activity, Reliant on assistive device for balance Standing balance-Leahy Scale: Poor Standing balance comment: Dependent on RW                            Communication Communication Communication: No apparent difficulties  Cognition Arousal: Alert Behavior During Therapy: WFL for tasks assessed/performed   PT -  Cognitive impairments: No apparent impairments                         Following commands: Intact      Cueing Cueing Techniques: Verbal cues, Visual cues  Exercises Total Joint Exercises Ankle Circles/Pumps: 10 reps, AROM, Both Quad Sets: AROM, Right, 5 reps Heel Slides: AAROM, Right, 5 reps Heel Slides  Limitations: pain Hip ABduction/ADduction: AAROM, Right, 5 reps Straight Leg Raises: AAROM, Right, Supine, 5 reps Long Arc Quad: AAROM, Right, 5 reps, Seated Knee Flexion: AAROM, Right, Seated Goniometric ROM: grossly 70 degrees in sitting    General Comments General comments (skin integrity, edema, etc.): During straining of exercises pt reported dizziness. Educated in breathing during exercises and not holding her breath      Pertinent Vitals/Pain Pain Assessment Pain Assessment: 0-10 Pain Score: 8  Pain Location: R knee Pain Descriptors / Indicators: Grimacing, Operative site guarding, Sharp, Sore Pain Intervention(s): Limited activity within patient's tolerance, Monitored during session, Premedicated before session, Repositioned    Home Living                          Prior Function            PT Goals (current goals can now be found in the care plan section) Acute Rehab PT Goals Patient Stated Goal: to return to independence PT Goal Formulation: With patient/family Time For Goal Achievement: 11/03/24 Potential to Achieve Goals: Fair Progress towards PT goals: Progressing toward goals    Frequency    7X/week      PT Plan      Co-evaluation              AM-PAC PT 6 Clicks Mobility   Outcome Measure  Help needed turning from your back to your side while in a flat bed without using bedrails?: A Little Help needed moving from lying on your back to sitting on the side of a flat bed without using bedrails?: A Little Help needed moving to and from a bed to a chair (including a wheelchair)?: A Little Help needed standing up from a chair using your arms (e.g., wheelchair or bedside chair)?: A Little Help needed to walk in hospital room?: A Little Help needed climbing 3-5 steps with a railing? : A Lot (may need +2 safety if still dizzy) 6 Click Score: 17    End of Session Equipment Utilized During Treatment: Right knee immobilizer Activity  Tolerance: Patient tolerated treatment well;Patient limited by pain Patient left: with call bell/phone within reach;in chair;with chair alarm set   PT Visit Diagnosis: Other abnormalities of gait and mobility (R26.89);Muscle weakness (generalized) (M62.81);Pain Pain - Right/Left: Right Pain - part of body: Knee     Time: 9094-9051 PT Time Calculation (min) (ACUTE ONLY): 43 min  Charges:    $Gait Training: 8-22 mins $Therapeutic Exercise: 23-37 mins PT General Charges $$ ACUTE PT VISIT: 1 Visit                      Macario RAMAN, PT Acute Rehabilitation Services  Office [phone]    Macario SHAUNNA Soja 10/21/2024, 10:11 AM

## 2024-10-21 NOTE — Progress Notes (Signed)
 Physical Therapy Treatment Patient Details Name: Laura [name] MRN: [id number] DOB: 02-03-1966 Today's Date: 10/21/2024   History of Present Illness 58 y.o. female presents to [facility] hospital on 10/16/2024 for elective R TKA. Post-op hypokalemia. PMH includes hypothyroidism, HTN, HLD, fibromyalgia, GAD, CKD, depression.    PT Comments  Patient getting up to go to bathroom (with RN present) on arrival. Afterwards, pt ambulated with RW x 100 ft to PT gym and then sat to rest. Then completed stair training with cues for sequencing on descent x 1 of 5 steps. Patient rested and then returned to room with RW and supervision (occasional cues for posture). Patient in significant pain on return to room and RN notified she would like pain medication. Prepped ice therapy machine to apply to rt knee, however OT then arrived and pt agreed to work with OT first.     If plan is discharge home, recommend the following: A little help with bathing/dressing/bathroom;Assistance with cooking/housework;Assist for transportation;Help with stairs or ramp for entrance;A lot of help with walking and/or transfers   Can travel by private vehicle        Equipment Recommendations  Rolling walker (2 wheels);BSC/3in1    Recommendations for Other Services       Precautions / Restrictions Precautions Precautions: Fall;Knee Precaution Booklet Issued: Yes (comment) Recall of Precautions/Restrictions: Intact Precaution/Restrictions Comments: orthostasis 12/20 Required Braces or Orthoses: Knee Immobilizer - Right Knee Immobilizer - Right: On when out of bed or walking Restrictions Weight Bearing Restrictions Per Provider Order: Yes RLE Weight Bearing Per Provider Order: Weight bearing as tolerated     Mobility  Bed Mobility Overal bed mobility: Needs Assistance Bed Mobility: Sit to Supine       Sit to supine: Used rails, Min assist   General bed mobility comments: min assist (light) for lifting RLE onto bed     Transfers Overall transfer level: Needs assistance Equipment used: Rolling walker (2 wheels) Transfers: Sit to/from Stand Sit to Stand: Supervision           General transfer comment: from recliner and comfort height toilet    Ambulation/Gait Ambulation/Gait assistance: Contact guard assist, Supervision Gait Distance (Feet): 100 Feet (seated rest, 100) Assistive device: Rolling walker (2 wheels) Gait Pattern/deviations: Step-to pattern, Decreased step length - left, Decreased weight shift to right, Trunk flexed, Wide base of support Gait velocity: Decreased     General Gait Details: Pt with lesser reliance on RW with UE's. VC's for improved posture, increased heel strike, and sequencing with the RW. Reported no dizziness with walking   Stairs   Stairs assistance: Contact guard assist Stair Management: Step to pattern, Forwards, Two rails Number of Stairs: 5 General stair comments: gym stairs x 2 reps   Wheelchair Mobility     Tilt Bed    Modified Rankin (Stroke Patients Only)       Balance Overall balance assessment: Needs assistance Sitting-balance support: No upper extremity supported, Feet supported Sitting balance-Leahy Scale: Fair     Standing balance support: Bilateral upper extremity supported, Single extremity supported, During functional activity, Reliant on assistive device for balance Standing balance-Leahy Scale: Poor Standing balance comment: Dependent on RW                            Communication Communication Communication: No apparent difficulties  Cognition Arousal: Alert Behavior During Therapy: WFL for tasks assessed/performed   PT - Cognitive impairments: No apparent impairments  Following commands: Intact      Cueing Cueing Techniques: Verbal cues, Visual cues  Exercises Total Joint Exercises Ankle Circles/Pumps: AROM, Both, 10 reps Quad Sets: AROM, Right, 5 reps Heel Slides:  AAROM, Right, 5 reps Heel Slides Limitations: pain Hip ABduction/ADduction: AAROM, Right, 5 reps Straight Leg Raises: AAROM, Right, Supine, 5 reps Long Arc Quad: AAROM, Right, 5 reps, Seated Knee Flexion: AAROM, Right, Seated Goniometric ROM: grossly 70 degrees in sitting    General Comments General comments (skin integrity, edema, etc.): During straining of exercises pt reported dizziness. Educated in breathing during exercises and not holding her breath      Pertinent Vitals/Pain Pain Assessment Pain Assessment: 0-10 Pain Score: 7  Pain Location: R knee Pain Descriptors / Indicators: Grimacing, Operative site guarding, Sharp, Sore, Moaning Pain Intervention(s): Limited activity within patient's tolerance, Monitored during session, Patient requesting pain meds-RN notified    Home Living                          Prior Function            PT Goals (current goals can now be found in the care plan section) Acute Rehab PT Goals Patient Stated Goal: to return to independence PT Goal Formulation: With patient/family Time For Goal Achievement: 11/03/24 Potential to Achieve Goals: Fair Progress towards PT goals: Progressing toward goals    Frequency    7X/week      PT Plan      Co-evaluation              AM-PAC PT 6 Clicks Mobility   Outcome Measure  Help needed turning from your back to your side while in a flat bed without using bedrails?: A Little Help needed moving from lying on your back to sitting on the side of a flat bed without using bedrails?: A Little Help needed moving to and from a bed to a chair (including a wheelchair)?: A Little Help needed standing up from a chair using your arms (e.g., wheelchair or bedside chair)?: A Little Help needed to walk in hospital room?: A Little Help needed climbing 3-5 steps with a railing? : A Little 6 Click Score: 18    End of Session Equipment Utilized During Treatment: Right knee  immobilizer Activity Tolerance: Patient limited by pain Patient left: with call bell/phone within reach;in bed;with nursing/sitter in room;Other (comment) (OT arrived) Nurse Communication: Mobility status PT Visit Diagnosis: Other abnormalities of gait and mobility (R26.89);Muscle weakness (generalized) (M62.81);Pain Pain - Right/Left: Right Pain - part of body: Knee     Time: 1311-1347 PT Time Calculation (min) (ACUTE ONLY): 36 min  Charges:    $Gait Training: 23-37 mins $Therapeutic Exercise: 23-37 mins PT General Charges $$ ACUTE PT VISIT: 1 Visit                      Macario RAMAN, PT Acute Rehabilitation Services  Office [phone]    Macario SHAUNNA Soja 10/21/2024, 1:56 PM

## 2024-10-21 NOTE — Progress Notes (Signed)
 Subjective: 5 Days Post-Op Procedures (LRB): ARTHROPLASTY, KNEE, TOTAL (Right) Patient reports pain as moderate.  Doing better.  Labs stable  Objective: Vital signs in last 24 hours: Temp:  [97.5 F (36.4 C)-98.8 F (37.1 C)] 97.5 F (36.4 C) (12/21 0734) Pulse Rate:  [76-86] 76 (12/21 0734) Resp:  [16-17] 16 (12/21 0734) BP: (98-132)/(64-73) 132/73 (12/21 0734) SpO2:  [92 %-97 %] 96 % (12/21 0734)  Intake/Output from previous day: 12/20 0701 - 12/21 0700 In: 740 [P.O.:240; IV Piggyback:500] Out: -  Intake/Output this shift: Total I/O In: 267.3 [IV Piggyback:267.3] Out: -   Recent Labs    10/20/24 1539 10/21/24 0605  HGB 10.0* 10.2*   Recent Labs    10/20/24 1539 10/21/24 0605  WBC 7.7 6.5  RBC 3.62* 3.66*  HCT 31.2* 31.0*  PLT 297 312   Recent Labs    10/20/24 0912 10/21/24 0605  NA 140 139  K 4.1 3.8  CL 103 103  CO2 30 26  BUN 13 13  CREATININE 0.53 0.51  GLUCOSE 147* 125*  CALCIUM  8.8* 9.9   No results for input(s): LABPT, INR in the last 72 hours.  Neurologically intact Neurovascular intact Sensation intact distally Intact pulses distally Dorsiflexion/Plantar flexion intact Incision: scant drainage No cellulitis present Compartment soft   Assessment/Plan: 5 Days Post-Op Procedures (LRB): ARTHROPLASTY, KNEE, TOTAL (Right) Advance diet Up with therapy WBAT RLE D/c dispo to be discussed further with Dr. Addie tomorrow.  Patient would like to go home with daughter      Ronal LITTIE Grave 10/21/2024, 1:11 PM

## 2024-10-21 NOTE — Plan of Care (Signed)
" °  Problem: Health Behavior/Discharge Planning: Goal: Ability to manage health-related needs will improve Outcome: Progressing   Problem: Clinical Measurements: Goal: Ability to maintain clinical measurements within normal limits will improve Outcome: Progressing Goal: Will remain free from infection Outcome: Progressing Goal: Diagnostic test results will improve Outcome: Progressing Goal: Cardiovascular complication will be avoided Outcome: Progressing   Problem: Nutrition: Goal: Adequate nutrition will be maintained Outcome: Progressing   Problem: Coping: Goal: Level of anxiety will decrease Outcome: Progressing   Problem: Elimination: Goal: Will not experience complications related to bowel motility Outcome: Progressing   Problem: Pain Managment: Goal: General experience of comfort will improve and/or be controlled Outcome: Progressing   Problem: Safety: Goal: Ability to remain free from injury will improve Outcome: Progressing   Problem: Skin Integrity: Goal: Risk for impaired skin integrity will decrease Outcome: Progressing   Problem: Education: Goal: Knowledge of the prescribed therapeutic regimen will improve Outcome: Progressing Goal: Individualized Educational Video(s) Outcome: Progressing   Problem: Activity: Goal: Ability to avoid complications of mobility impairment will improve Outcome: Progressing   Problem: Clinical Measurements: Goal: Postoperative complications will be avoided or minimized Outcome: Progressing   Problem: Pain Management: Goal: Pain level will decrease with appropriate interventions Outcome: Progressing   Problem: Skin Integrity: Goal: Will show signs of wound healing Outcome: Progressing   "

## 2024-10-22 LAB — BASIC METABOLIC PANEL WITH GFR
Anion gap: 6 (ref 5–15)
BUN: 12 mg/dL (ref 6–20)
CO2: 32 mmol/L (ref 22–32)
Calcium: 10.2 mg/dL (ref 8.9–10.3)
Chloride: 101 mmol/L (ref 98–111)
Creatinine, Ser: 0.56 mg/dL (ref 0.44–1.00)
GFR, Estimated: >60 mL/min
Glucose, Bld: 117 mg/dL — ABNORMAL HIGH (ref 70–99)
Potassium: 4.4 mmol/L (ref 3.5–5.1)
Sodium: 140 mmol/L (ref 135–145)

## 2024-10-22 LAB — MAGNESIUM: Magnesium: 1.7 mg/dL (ref 1.7–2.4)

## 2024-10-22 MED ORDER — CARVEDILOL 12.5 MG PO TABS: 12.5000 mg | ORAL_TABLET | Freq: Two times a day (BID) | ORAL | 0 refills | Status: DC

## 2024-10-22 MED ORDER — CHLORTHALIDONE 25 MG PO TABS: 12.5000 mg | ORAL_TABLET | Freq: Every morning | ORAL | Status: AC

## 2024-10-22 NOTE — Progress Notes (Signed)
 PT Cancellation Note  Patient Details Name: Laura [name] MRN: [id number] DOB: Mar 02, 1966   Cancelled Treatment:    Reason Eval/Treat Not Completed: Patient declined, no reason specified. PT attempted to see pt for treatment however pt declines mobilizing at this time. Pt reports she is discharging home and wants to save her energy for mobility required to get into the car and home. PT will follow until discharge is complete.   Bernardino JINNY Ruth 10/22/2024, 10:22 AM

## 2024-10-22 NOTE — Progress Notes (Signed)
 Patient discharging to home via private transportation, iv access removed and AVS reviewed at bedside, walker and 3N1 BSC at bedside to take home.

## 2024-10-22 NOTE — Plan of Care (Signed)
  Problem: Health Behavior/Discharge Planning: Goal: Ability to manage health-related needs will improve Outcome: Progressing   Problem: Clinical Measurements: Goal: Ability to maintain clinical measurements within normal limits will improve Outcome: Progressing   Problem: Nutrition: Goal: Adequate nutrition will be maintained Outcome: Progressing   Problem: Coping: Goal: Level of anxiety will decrease Outcome: Progressing   Problem: Safety: Goal: Ability to remain free from injury will improve Outcome: Progressing

## 2024-10-22 NOTE — Anesthesia Postprocedure Evaluation (Signed)
"   Anesthesia Post Note  Patient: Laura [name]  Procedure(s) Performed: ARTHROPLASTY, KNEE, TOTAL (Right: Knee)     Patient location during evaluation: PACU Anesthesia Type: General Level of consciousness: awake Pain management: pain level controlled Vital Signs Assessment: post-procedure vital signs reviewed and stable Respiratory status: spontaneous breathing Cardiovascular status: blood pressure returned to baseline Postop Assessment: no apparent nausea or vomiting Anesthetic complications: no   No notable events documented.             [name]      "

## 2024-10-22 NOTE — Progress Notes (Signed)
 "  CONSULT F/U NOTE  Laura [name]  FMW:[id number] DOB: [birth year] DOA: 10/16/2024 PCP: Milon Cleaves, PA    Brief Narrative:  58 year old with an extensive medical history as noted below who was admitted to the hospital by Dr. 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 Her postoperative course has been complicated by severe refractory hypokalemia, for which I was consulted. 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 She is aware that chlorthalidone  and Lasix  both can contribute to hypokalemia but states that she developed severe peripheral edema in the absence of these medications. She reports that her hypokalemia is usually able to be managed by high-dose supplemental potassium at home but that she is prone to exacerbations during episodes of acute illness such as at present. She denies chest pain shortness of breath nausea vomiting or abdominal pain at present.   Goals of Care:   Code Status: Full Code   Interim Hx: No acute events recorded overnight.  Orthopedics planning for discharge home today.  Agree patient is medically stable for discharge.  I have reviewed her discharge medication list and made adjustments as appropriate for medical issues.    Assessment & Plan:  Familial hypokalemia NOS - severe acute hypokalemia  Continue usual  home meds upon d/c - typically well controlled when not acutely ill   Hypomagnesemia Likely due to poor oral intake and use of diuretics - outpatient f/u suggested - continue oral replacement after d/c   Orthostasis Due to poor intake and mobility in the perioperative state - hold diuretics for another 2 days after d/c - continue lower dose of coreg  after d/c - stop prazosin  for now - need to f/u with her PCP in a week to 10 days for recheck of volume status and BP   Anxiety/depression Continue usual outpatient medications    DM2 Appears well-controlled at present - no changes recommended in her treatment regimen    Hashimoto's resulting in hypothyroidism Continue usual Synthroid  supplementation   Disposition: The patient is medically stable for disposition at the discretion of the primary service at this time - she should return to her usual home medication regimen at the time of discharge, including her prior usual potassium replacement    Objective: Blood pressure 114/89, pulse 73, temperature 97.6 F (36.4 C), resp. rate 18, height 5' 5 (1.651 m), weight 82.6 kg, SpO2 94%.  Intake/Output Summary (Last 24 hours) at 10/22/2024 0919 Last data filed at 10/22/2024 0700 Gross per 24 hour  Intake 627.29 ml  Output --  Net 627.29 ml   Filed Weights   10/16/24 0722  Weight: 82.6 kg    Examination: Patient being discharged by Orthopedics. No need to wait to be seen by Medicine before d/c.    Basic Metabolic Panel: Recent Labs  Lab 10/20/24 0636 10/20/24 0912 10/21/24 0605 10/22/24 0333  NA  --  140 139 140  K  --  4.1 3.8 4.4  CL  --  103 103 101  CO2  --  30 26 32  GLUCOSE  --  147* 125* 117*  BUN  --  13 13 12   CREATININE  --  0.53 0.51 0.56  CALCIUM   --  8.8* 9.9 10.2  MG 2.0  --  1.6* 1.7   GFR: Estimated Creatinine Clearance: 80.3 mL/min (by C-G formula based on SCr of 0.56 mg/dL).   Scheduled Meds:  ALPRAZolam   1 mg Oral TID   aspirin   81 mg Oral BID   budesonide -glycopyrrolate -formoterol   2 puff Inhalation BID   carvedilol   12.5 mg Oral BID WC   docusate sodium   100 mg Oral BID   famotidine   20 mg Oral BID   levothyroxine   88 mcg Oral Daily   magnesium  gluconate  500 mg Oral BID   naproxen   250 mg Oral BID WC   polyethylene glycol  17 g Oral BID   potassium chloride   40 mEq Oral QID   zolpidem   5 mg Oral QHS      LOS: 4 days   Reyes IVAR Moores, MD [facility] Office  [phone] Pager - Text Page per Tracey  If 7PM-7AM, please contact night-coverage  per Amion 10/22/2024, 9:19 AM     "

## 2024-10-22 NOTE — Care Management Important Message (Signed)
 Important Message  Patient Details  Name: Laura [name] MRN: [id number] Date of Birth: Mar 08, 1966   Important Message Given:  Yes - Medicare IM     Jennie Laneta Dragon 10/22/2024, 2:10 PM

## 2024-10-22 NOTE — Progress Notes (Signed)
" °  Subjective: Patient stable.  Pain controlled.  Was able to do well with stairs yesterday in therapy   Objective: Vital signs in last 24 hours: Temp:  [97.5 F (36.4 C)-98.2 F (36.8 C)] 97.6 F (36.4 C) (12/22 0757) Pulse Rate:  [73-80] 73 (12/22 0757) Resp:  [16-18] 18 (12/22 0757) BP: (105-114)/(67-89) 114/89 (12/22 0757) SpO2:  [94 %-98 %] 94 % (12/22 0757)  Intake/Output from previous day: 12/21 0701 - 12/22 0700 In: 507.3 [P.O.:240; IV Piggyback:267.3] Out: -  Intake/Output this shift: No intake/output data recorded.  Exam:  Neurovascular intact Sensation intact distally Intact pulses distally  Labs: Recent Labs    10/20/24 1539 10/21/24 0605  HGB 10.0* 10.2*   Recent Labs    10/20/24 1539 10/21/24 0605  WBC 7.7 6.5  RBC 3.62* 3.66*  HCT 31.2* 31.0*  PLT 297 312   Recent Labs    10/21/24 0605 10/22/24 0333  NA 139 140  K 3.8 4.4  CL 103 101  CO2 26 32  BUN 13 12  CREATININE 0.51 0.56  GLUCOSE 125* 117*  CALCIUM  9.9 10.2   No results for input(s): LABPT, INR in the last 72 hours.  Assessment/Plan: Plan at this time is discharged to home.  Will set up home health PT.  Patient has CPM machine at home as well.  Continue with range of motion and leg strengthening exercises.  Follow-up with us  next week.   Laura [name] 10/22/2024, 8:18 AM     "

## 2024-10-23 ENCOUNTER — Telehealth: Payer: Self-pay | Admitting: Orthopedic Surgery

## 2024-10-23 ENCOUNTER — Telehealth: Payer: Self-pay

## 2024-10-23 NOTE — Transitions of Care (Post Inpatient/ED Visit) (Signed)
" ° °  10/23/2024  Name: Laura [name] MRN: [id number] DOB: 01-Sep-1966  Today's TOC FU Call Status: Today's TOC FU Call Status:: Unsuccessful Call (1st Attempt) Unsuccessful Call (1st Attempt) Date: 10/23/24  Attempted to reach the patient regarding the most recent Inpatient/ED visit.  Follow Up Plan: Additional outreach attempts will be made to reach the patient to complete the Transitions of Care (Post Inpatient/ED visit) call.   Shona Prow RN, CCM [place]  VBCI-Population Health RN Care Manager [phone]  "

## 2024-10-23 NOTE — Telephone Encounter (Signed)
 Johnny (PT) from [facility] called requesting verbals for 2wk 3 total knee. Johnny secure number is [phone].

## 2024-10-23 NOTE — Transitions of Care (Post Inpatient/ED Visit) (Signed)
" ° °  10/23/2024  Name: Laura [name] MRN: [id number] DOB: 02/03/1966  Today's TOC FU Call Status: Today's TOC FU Call Status:: Successful TOC FU Call Completed TOC FU Call Complete Date: 10/23/24 [facility] briefly with patient who was given number for [facility] Ortho floor [phone] to discuss the grabber and [facility] RN scheduled hospital f/u with PCP - has home health, declined TOC program)  Patient's Name and Date of Birth confirmed. DOB, Name    Follow up appointments reviewed: PCP Follow-up appointment confirmed?: Yes Date of PCP follow-up appointment?: 10/30/24 Follow-up Provider: [facility] Follow-up appointment confirmed?: Yes Date of Specialist follow-up appointment?: 10/31/24 Follow-Up Specialty Provider:: surgeon, Addie, Cordella Hamilton, MD    Shona Prow RN, CCM [place]  VBCI-Population Health RN Care Manager [phone]  "

## 2024-10-23 NOTE — Telephone Encounter (Signed)
 Results Disclosure Attempt  I attempted to contact Laura  [name]  to disclose their genetic test results, they were not available. I spoke with her daughter who shared she was working with physical therapy and requested that I call her back at another time.  [name], MS, CGC  Certified Genetic Counselor  Email: [email or web address]  Phone: [phone]

## 2024-10-24 NOTE — Telephone Encounter (Signed)
IC LMVM verbal given.

## 2024-10-30 ENCOUNTER — Encounter: Payer: Self-pay | Admitting: Physician Assistant

## 2024-10-30 ENCOUNTER — Ambulatory Visit: Admitting: Physician Assistant

## 2024-10-30 ENCOUNTER — Other Ambulatory Visit: Payer: Self-pay | Admitting: Physician Assistant

## 2024-10-30 VITALS — BP 110/80 | HR 72 | Temp 97.2°F | Resp 16 | Ht 65.0 in | Wt 189.0 lb

## 2024-10-30 DIAGNOSIS — E1165 Type 2 diabetes mellitus with hyperglycemia: Secondary | ICD-10-CM

## 2024-10-30 DIAGNOSIS — E876 Hypokalemia: Secondary | ICD-10-CM

## 2024-10-30 DIAGNOSIS — G4733 Obstructive sleep apnea (adult) (pediatric): Secondary | ICD-10-CM | POA: Diagnosis not present

## 2024-10-30 DIAGNOSIS — E782 Mixed hyperlipidemia: Secondary | ICD-10-CM | POA: Diagnosis not present

## 2024-10-30 DIAGNOSIS — F411 Generalized anxiety disorder: Secondary | ICD-10-CM

## 2024-10-30 DIAGNOSIS — E039 Hypothyroidism, unspecified: Secondary | ICD-10-CM

## 2024-10-30 DIAGNOSIS — Z96651 Presence of right artificial knee joint: Secondary | ICD-10-CM | POA: Diagnosis not present

## 2024-10-30 DIAGNOSIS — I10 Essential (primary) hypertension: Secondary | ICD-10-CM | POA: Diagnosis not present

## 2024-10-30 MED ORDER — EPINEPHRINE 0.3 MG/0.3ML IJ SOAJ: 0.3000 mg | INTRAMUSCULAR | 2 refills | Status: AC | PRN

## 2024-10-30 NOTE — Progress Notes (Signed)
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 total) by mouth 2 (two) times daily. 10 capsule 0   EPINEPHrine  0.3 mg/0.3 mL IJ SOAJ injection Inject 0.3 mLs (0.3 mg total) into the muscle as needed for anaphylaxis. 1 each 2   Evolocumab  (REPATHA  SURECLICK) 140 MG/ML SOAJ Inject 140 mg into the skin every 14 (fourteen) days. 2 mL 2   furosemide  (LASIX ) 20 MG tablet Take 1 tablet (20 mg total) by mouth 2 (two) times daily.     glucose blood (ACCU-CHEK GUIDE) test strip 1 each by  Other route daily in the afternoon. Use as instructed 100 each 12   HYDROmorphone  (DILAUDID ) 4 MG tablet Take 1 tablet (4 mg total) by mouth every 4 (four) hours as needed for moderate pain (pain score 4-6) (pains score 4-6). 30 tablet 0   ipratropium-albuterol  (DUONEB) 0.5-2.5 (3) MG/3ML SOLN Take 3 mLs by nebulization every 4 (four) hours as needed. 360 mL 2   levothyroxine  (SYNTHROID ) 88 MCG tablet Take 1 tablet (88 mcg total) by mouth daily. 90 tablet 0   magnesium  gluconate (MAGONATE) 500 (27 Mg) MG TABS tablet Take 1 tablet (500 mg total) by mouth  2 (two) times daily. 60 tablet 0   nitrofurantoin , macrocrystal-monohydrate, (MACROBID ) 100 MG capsule Take 1 capsule (100 mg total) by mouth 2 (two) times daily. 10 capsule 0   nystatin  powder Apply 1 Application topically 2 (two) times daily as needed (for skin irritation- affected areas).     ondansetron  (ZOFRAN -ODT) 4 MG disintegrating tablet Take 1 tablet (4 mg total) by mouth every 8 (eight) hours as needed for nausea or vomiting. 20 tablet 0   phenazopyridine  (PYRIDIUM ) 200 MG tablet Take 1 tablet (200 mg total) by mouth 3 (three) times daily. 6 tablet 0   potassium chloride  20 MEQ/15ML (10%) SOLN Take 30 mLs (40 mEq total) by mouth 3 (three) times daily. 2700 mL 1   promethazine  (PHENERGAN ) 25 MG tablet Take 1 tablet (25 mg total) by mouth every 6 (six) hours as needed for nausea or vomiting. 30 tablet 0   Ruxolitinib  Phosphate (OPZELURA ) 1.5 % CREA Apply sparingly twice daily if needed 60 g 11   Semaglutide , 1 MG/DOSE, (OZEMPIC , 1 MG/DOSE,) 4 MG/3ML SOPN Inject 1 mg into the skin once a week. 9 mL 2   tiZANidine  (ZANAFLEX ) 2 MG tablet Take 1-2 tablets (2-4 mg total) by mouth every 6 (six) hours as needed for muscle spasms. 240 tablet 5   Ubrogepant  (UBRELVY ) 100 MG TABS Take 1 tablet (100 mg total) by mouth 2 (two) times daily as needed (Take one and if symptoms persist after 2 hours take a second one. Do not take more than 2 in 24 hours). 30 tablet 2   Vitamin D , Ergocalciferol , (DRISDOL ) 1.25 MG (50000 UNIT) CAPS capsule Take 1 capsule (50,000 Units total) by mouth every 7 (seven) days. 5 capsule 10   Vonoprazan Fumarate  (VOQUEZNA ) 10 MG TABS Take 10 mg by mouth daily. 90 tablet 3   zolpidem  (AMBIEN ) 10 MG tablet Take 1 tablet (10 mg total) by mouth at bedtime. 90 tablet 0   No current facility-administered medications on file prior to visit.   "

## 2024-10-30 NOTE — Assessment & Plan Note (Addendum)
 Essential hypertension Blood pressure well-controlled with carvedilol . Minipress  discontinued due to hypotension. Current blood pressure is 110/80. - Continue carvedilol  regimen: 25 mg at night, 25 mg in the morning, and 12.5 mg at lunch. - Monitor blood pressure regularly. Orders:   Comprehensive metabolic panel with GFR   CBC with Differential/Platelet

## 2024-10-30 NOTE — Assessment & Plan Note (Addendum)
 Hypothyroidism Thyroid  function last checked in November. Endocrinologist monitoring levels, no thyroid  ablation planned. Management with levothyroxine  ongoing. - Rechecked thyroid  function with current labs. - Continue levothyroxine  as prescribed. Orders:   Thyroid  Panel With TSH

## 2024-10-30 NOTE — Assessment & Plan Note (Addendum)
 Hypokalemia Potassium levels dropped to 2.2 during hospitalization due to discontinuation of supplements. Previously increased to 3.4-3.8 with supplementation. Current status needs re-evaluation. - Rechecked potassium levels with current labs. - Continue potassium supplementation as needed.

## 2024-10-31 ENCOUNTER — Ambulatory Visit: Admitting: Orthopedic Surgery

## 2024-10-31 ENCOUNTER — Institutional Professional Consult (permissible substitution) (HOSPITAL_BASED_OUTPATIENT_CLINIC_OR_DEPARTMENT_OTHER): Admitting: Family

## 2024-10-31 ENCOUNTER — Other Ambulatory Visit

## 2024-10-31 ENCOUNTER — Encounter: Payer: Self-pay | Admitting: Orthopedic Surgery

## 2024-10-31 DIAGNOSIS — Z96651 Presence of right artificial knee joint: Secondary | ICD-10-CM

## 2024-10-31 MED ORDER — HYDROMORPHONE HCL 4 MG PO TABS: ORAL_TABLET | ORAL | 0 refills | Status: DC

## 2024-10-31 MED ORDER — ALPRAZOLAM 1 MG PO TABS: 1.0000 mg | ORAL_TABLET | Freq: Three times a day (TID) | ORAL | 0 refills | Status: AC

## 2024-10-31 NOTE — Assessment & Plan Note (Addendum)
°  Orders:   Lipid panel
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
## 2024-10-31 NOTE — Assessment & Plan Note (Addendum)
 Type 2 diabetes mellitus with hyperglycemia Diabetes management interrupted due to surgery, with Ozempic  discontinued. A1c last checked in December, current levels need assessment. - Ordered A1c test to assess current glycemic control. - Discuss with Dr. Addie about resuming Ozempic  post-surgery. Orders:   Hemoglobin A1c

## 2024-10-31 NOTE — Assessment & Plan Note (Addendum)
" °  Orders:   ALPRAZolam  (XANAX ) 1 MG tablet; Take 1 tablet (1 mg total) by mouth 3 (three) times daily.   ALPRAZolam  (XANAX ) 1 MG tablet; Take 1 tablet (1 mg total) by mouth 3 (three) times daily.  "

## 2024-11-02 ENCOUNTER — Other Ambulatory Visit: Payer: Self-pay

## 2024-11-02 LAB — COMPREHENSIVE METABOLIC PANEL WITH GFR
ALT: 31 IU/L (ref 0–32)
AST: 33 IU/L (ref 0–40)
Albumin: 4.3 g/dL (ref 3.8–4.9)
Alkaline Phosphatase: 92 IU/L (ref 49–135)
BUN/Creatinine Ratio: 18 (ref 9–23)
BUN: 17 mg/dL (ref 6–24)
Bilirubin Total: 0.3 mg/dL (ref 0.0–1.2)
CO2: 27 mmol/L (ref 20–29)
Calcium: 10 mg/dL (ref 8.7–10.2)
Chloride: 96 mmol/L (ref 96–106)
Creatinine, Ser: 0.94 mg/dL (ref 0.57–1.00)
Globulin, Total: 2.8 g/dL (ref 1.5–4.5)
Glucose: 115 mg/dL — ABNORMAL HIGH (ref 70–99)
Potassium: 4.6 mmol/L (ref 3.5–5.2)
Sodium: 138 mmol/L (ref 134–144)
Total Protein: 7.1 g/dL (ref 6.0–8.5)
eGFR: 70 mL/min/1.73

## 2024-11-02 LAB — THYROID PANEL WITH TSH
Free Thyroxine Index: 2.8 (ref 1.2–4.9)
T3 Uptake Ratio: 25 % (ref 24–39)
T4, Total: 11.3 ug/dL (ref 4.5–12.0)
TSH: 11.2 u[IU]/mL — ABNORMAL HIGH (ref 0.450–4.500)

## 2024-11-02 LAB — CBC WITH DIFFERENTIAL/PLATELET
Basophils Absolute: 0.1 x10E3/uL (ref 0.0–0.2)
Basos: 1 %
EOS (ABSOLUTE): 0.2 x10E3/uL (ref 0.0–0.4)
Eos: 2 %
Hematocrit: 37.7 % (ref 34.0–46.6)
Hemoglobin: 12 g/dL (ref 11.1–15.9)
Immature Grans (Abs): 0 x10E3/uL (ref 0.0–0.1)
Immature Granulocytes: 0 %
Lymphocytes Absolute: 2 x10E3/uL (ref 0.7–3.1)
Lymphs: 31 %
MCH: 27.5 pg (ref 26.6–33.0)
MCHC: 31.8 g/dL (ref 31.5–35.7)
MCV: 87 fL (ref 79–97)
Monocytes Absolute: 0.5 x10E3/uL (ref 0.1–0.9)
Monocytes: 8 %
Neutrophils Absolute: 3.6 x10E3/uL (ref 1.4–7.0)
Neutrophils: 57 %
Platelets: 608 x10E3/uL — ABNORMAL HIGH (ref 150–450)
RBC: 4.36 x10E6/uL (ref 3.77–5.28)
RDW: 13.2 % (ref 11.7–15.4)
WBC: 6.3 x10E3/uL (ref 3.4–10.8)

## 2024-11-02 LAB — LIPID PANEL
Chol/HDL Ratio: 5.4 ratio — ABNORMAL HIGH (ref 0.0–4.4)
Cholesterol, Total: 269 mg/dL — ABNORMAL HIGH (ref 100–199)
HDL: 50 mg/dL
LDL Chol Calc (NIH): 169 mg/dL — ABNORMAL HIGH (ref 0–99)
Triglycerides: 268 mg/dL — ABNORMAL HIGH (ref 0–149)
VLDL Cholesterol Cal: 50 mg/dL — ABNORMAL HIGH (ref 5–40)

## 2024-11-02 LAB — MAGNESIUM: Magnesium: 2 mg/dL (ref 1.6–2.3)

## 2024-11-02 LAB — PHOSPHORUS: Phosphorus: 4.2 mg/dL (ref 3.0–4.3)

## 2024-11-02 LAB — PARATHYROID HORMONE, INTACT (NO CA): PTH: 38 pg/mL (ref 15–65)

## 2024-11-02 LAB — HEMOGLOBIN A1C
Est. average glucose Bld gHb Est-mCnc: 131 mg/dL
Hgb A1c MFr Bld: 6.2 % — ABNORMAL HIGH (ref 4.8–5.6)

## 2024-11-02 NOTE — Patient Instructions (Signed)
 Visit Information  Thank you for taking time to visit with me today. Please don't hesitate to contact me if I can be of assistance to you before our next scheduled appointment.  Your next care management appointment is by telephone on 11/20/24 at 1:30 pm   Please call the care guide team at [phone] if you need to cancel, schedule, or reschedule an appointment.   Please call the Suicide and Crisis Lifeline: 988 call the [place]  National Suicide Prevention Lifeline: 941-714-4248 or TTY: 985 189 5965 TTY 641 600 0504) to talk to a trained counselor if you are experiencing a Mental Health or Behavioral Health Crisis or need someone to talk to.  Heddy Shutter, RN, MSN, BSN, CCM [facility], Population Health Case Manager Phone: [phone]

## 2024-11-02 NOTE — Patient Outreach (Signed)
 Complex Care Management   Visit Note  11/02/2024  Name:  Laura [name] MRN: [id number] DOB: [birth year]  Situation: Referral received for Complex Care Management related to HTN I obtained verbal consent from Patient.  Visit completed with Patient  on the phone  Background:   Past Medical History:  Diagnosis Date   Acute GI bleeding 09/01/2019   Anxiety and depression    Arthritis    Asthma    Barrett's esophagus 11/02/2016   Dr. Avram, GI   Bowel obstruction [facility])    Chest pain    a. 01/2016 Ex MV: Hypertensive response. Freq PVCs w/ exercise. nl EF. No ST/T changes. No ischemia.   Complex ovarian cyst, left 03/08/2017   COVID    COVID-19 10/2019   Cystocele    Diabetes mellitus without complication (HCC)    Exposure to hepatitis C    Fibromyalgia    GERD (gastroesophageal reflux disease)    Hashimoto's disease    Heart murmur    a. 03/2016 Echo: EF 60-65%, no rwma, mild MR, nl LA size, nl RV fxn.   Herpes zoster without complication 10/11/2021   High cholesterol    History of hiatal hernia    History of kidney stones    Hypertension    Myofascial pain syndrome    Nasal septal perforation 05/12/2018   Hx of cocaine use   Palpitations    a. 03/2016 Holter: Sinus rhythm, avg HR 83, max 123, min 64. 4 PACs. 10,356 isolated PVCs, one vent couplet, 3842 V bigeminy, 4 beats NSVT->prev on BB - dc 2/2 swelling.   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 Patient states she is currently staying with her daughter in [place], [place] until she is ready to return to her home and attend outpatient rehab therapy. Per patient she may transition back to her home as early as this weekend. She states she is currently active with home health Physical therapy. Patient reports BP currently being monitored by home health. Patient denies any specific questions or concerns at this time. Patient Reported Symptoms:  Cognitive Cognitive Status: No symptoms reported, Alert and oriented to person, place, and time      Neurological Neurological Review of Symptoms: No symptoms reported    HEENT HEENT Symptoms Reported: No symptoms reported      Cardiovascular Cardiovascular Symptoms Reported: No symptoms reported    Respiratory Respiratory Symptoms Reported: No symptoms reported    Endocrine Endocrine Symptoms Reported: No symptoms reported Is patient diabetic?: Yes Is patient checking blood sugars at home?: No    Gastrointestinal Gastrointestinal Symptoms Reported: Constipation Additional Gastrointestinal Details: patient reports taking miralax       Genitourinary Genitourinary Symptoms Reported: No symptoms reported    Integumentary Integumentary Symptoms Reported: Incision Additional Integumentary Details: Patient reports Steristrips-She denies any signs/symptoms of  infection.    Musculoskeletal Musculoskelatal Symptoms Reviewed: Joint pain, Limited mobility, Muscle pain Additional Musculoskeletal Details: right total knee replacement on 10/16/24. currently active with home health PT. Patient reports using walker Musculoskeletal Management Strategies: Medication therapy, Medical device, Exercise, Routine screening Musculoskeletal Self-Management Outcome: 4 (good) Falls in the past year?: No    Psychosocial Psychosocial Symptoms Reported: Anxiety - if selected complete GAD, Depression - if selected  complete PHQ 2-9 Additional Psychological Details: patient acknowledges history of depression and anxiety. Reports is active with therapist, but reports due to surgery she has not spoken with therapist since procedure. She states she will call to restart therapy once she transitions back to her home. She denies any specific concerns at this time.          11/02/2024    PHQ2-9 Depression Screening   Little interest or pleasure in doing things Nearly every day  Feeling down, depressed, or hopeless Nearly every day  PHQ-2 - Total Score 6  Trouble falling or staying asleep, or sleeping too much Not at all  Feeling tired or having little energy Nearly every day  Poor appetite or overeating  Not at all  Feeling bad about yourself - or that you are a failure or have let yourself or your family down Not at all  Trouble concentrating on things, such as reading the newspaper or watching television Several days  Moving or speaking so slowly that other people could have noticed.  Or the opposite - being so fidgety or restless that you have been moving around a lot more than usual Nearly every day  Thoughts that you would be better off dead, or hurting yourself in some way Not at all  PHQ2-9 Total Score 13  If you checked off any problems, how difficult have these problems made it for you to do your work, take care of things at home, or get along with other people Somewhat difficult  Depression Interventions/Treatment Currently on Treatment      11/02/2024   11:25 AM 08/30/2024   10:51 AM 05/22/2024   10:06 AM 04/21/2023    3:27 PM  GAD 7 : Generalized Anxiety Score  Nervous, Anxious, on Edge 1 1 2 3   Control/stop worrying 0 1 1 1   Worry too much - different things 0 1 2 1   Trouble relaxing 0 1  2  Restless 0 0 1 0  Easily annoyed or irritable 0 0 0 0  Afraid - awful might happen 0 0 0 0  Total GAD 7 Score 1 4  7   Anxiety Difficulty Not difficult at all Not difficult at all Somewhat difficult  Somewhat difficult      There were no vitals filed for this visit. Pain Scale: 0-10 Pain Score: 8  Pain Location: Knee Pain Orientation: Right  Medications Reviewed Today     Reviewed by Jonne Rote M, RN (Registered Nurse) on 11/02/24 at 1116  Med List Status: <None>   Medication Order Taking? Sig Documenting Provider Last Dose Status Informant  albuterol  (VENTOLIN  HFA) 108 (90 Base) MCG/ACT inhaler [id number] Yes Inhale 2 puffs into the lungs every 4 (four) hours as needed for wheezing or shortness of breath. Tapia, Leisa, PA-C  Active Self  ALPRAZolam  (XANAX ) 1 MG tablet [id number] Yes TAKE ONE TABLET BY MOUTH 3 TIMES DAILY Craft, Nola, [name]  Active   ALPRAZolam  (XANAX ) 1 MG tablet [id number]  Take 1 tablet (1 mg total) by mouth 3 (three) times daily. Milon Nola, PA  Active  ALPRAZolam  (XANAX ) 1 MG tablet [id number]  Take 1 tablet (1 mg total) by mouth 3 (three) times daily. Milon Cleaves, [name]  Active   aspirin  81 MG chewable tablet [id number]  Chew 1 tablet (81 mg total) by mouth 2 (two) times daily.  Patient not taking: Reported on 11/02/2024   Addie Cordella Hamilton, MD  Active   bisacodyl  5 MG EC tablet [id number] Yes Take 5 mg by mouth daily as needed for moderate constipation. [provider]  Active Self  budeson-glycopyrrolate -formoterol  (BREZTRI  AEROSPHERE) 160-9-4.8 MCG/ACT AERO inhaler [id number] Yes Inhale 2 puffs into the lungs 2 (two) times daily. Milon Cleaves, PA  Active Self  carvedilol  (COREG ) 12.5 MG tablet [id number] Yes Take 1 tablet (12.5 mg total) by mouth 2 (two) times daily with a meal. Danton Reyes DASEN, MD  Active   chlorthalidone  (HYGROTON ) 25 MG tablet [id number] Yes Take 0.5 tablets (12.5 mg total) by mouth every morning. Danton Reyes DASEN, MD  Active   docusate sodium  (COLACE) 100 MG capsule [id number] Yes Take 1 capsule (100 mg total) by mouth 2 (two) times daily. Addie Cordella Hamilton, MD  Active   EPINEPHrine  0.3 mg/0.3 mL IJ SOAJ injection [id number] Yes  Inject 0.3 mg into the muscle as needed for anaphylaxis. Milon Cleaves, PA  Active   Evolocumab  (REPATHA  SURECLICK) 140 MG/ML [name] [id number] Yes Inject 140 mg into the skin every 14 (fourteen) days. Cox, Kirsten, MD  Active Self  furosemide  (LASIX ) 20 MG tablet [id number] Yes Take 1 tablet (20 mg total) by mouth 2 (two) times daily. Danton Reyes DASEN, MD  Active   glucose blood (ACCU-CHEK GUIDE) test strip [id number] Yes 1 each by Other route daily in the afternoon. Use as instructed Abran Jerilynn Loving, MD  Active Self  HYDROmorphone  (DILAUDID ) 4 MG tablet [id number] Yes 1 po q 12hr prn pain Addie Cordella Hamilton, MD  Active   ipratropium-albuterol  (DUONEB) 0.5-2.5 (3) MG/3ML SOLN [id number] Yes Take 3 mLs by nebulization every 4 (four) hours as needed. Charlene Clotilda PARAS, NP  Active Self  levothyroxine  (SYNTHROID ) 88 MCG tablet [id number] Yes Take 1 tablet (88 mcg total) by mouth daily. Sirivol, Mamatha, MD  Active Self  magnesium  gluconate (MAGONATE) 500 (27 Mg) MG TABS tablet [id number] Yes Take 1 tablet (500 mg total) by mouth 2 (two) times daily. Danton Reyes DASEN, MD  Active   nystatin  powder [id number] Yes Apply 1 Application topically 2 (two) times daily as needed (for skin irritation- affected areas). [provider]  Active Self  ondansetron  (ZOFRAN -ODT) 4 MG disintegrating tablet [id number] Yes Take 1 tablet (4 mg total) by mouth every 8 (eight) hours as needed for nausea or vomiting. Milon Cleaves, PA  Active Self  potassium chloride  20 MEQ/15ML (10%) SOLN [id number] Yes Take 30 mLs (40 mEq total) by mouth 3 (three) times daily. Abigail Bernardino HERO, PA-C  Active   promethazine  (PHENERGAN ) 25 MG tablet [id number] Yes Take 1 tablet (25 mg total) by mouth every 6 (six) hours as needed for nausea or vomiting. Mannie Fairy DASEN, DO  Active Self  Ruxolitinib  Phosphate (OPZELURA ) 1.5 % CREA [id number] Yes Apply sparingly twice daily if needed Milon Cleaves, [name]  Active Self  Semaglutide , 1 MG/DOSE, (OZEMPIC ,  1 MG/DOSE,) 4 MG/3ML SOPN [id number]  Inject 1 mg into the skin once a week.  Patient not taking: Reported on 11/02/2024   Milon Cleaves, [name]  Active Self  tiZANidine  (ZANAFLEX ) 2 MG tablet [id number] Yes Take 1-2 tablets (2-4 mg total) by mouth every 6 (  six) hours as needed for muscle spasms. Lovorn, Megan, MD  Active Self  Ubrogepant  (UBRELVY ) 100 MG TABS [id number] Yes Take 1 tablet (100 mg total) by mouth 2 (two) times daily as needed (Take one and if symptoms persist after 2 hours take a second one. Do not take more than 2 in 24 hours). Milon Cleaves, PA  Active Self  Vitamin D , Ergocalciferol , (DRISDOL ) 1.25 MG (50000 UNIT) CAPS capsule [id number] Yes Take 1 capsule (50,000 Units total) by mouth every 7 (seven) days. Milon Cleaves, PA  Active Self  Vonoprazan Fumarate  (VOQUEZNA ) 10 MG TABS [id number] Yes Take 10 mg by mouth daily. Milon Cleaves, PA  Active Self  zolpidem  (AMBIEN ) 10 MG tablet [id number] Yes Take 1 tablet (10 mg total) by mouth at bedtime. Milon Cleaves, PA  Active Self          Recommendation:   Continue Current Plan of Care  Follow Up Plan:   Telephone follow up appointment date/time:  11/20/24 at 1:30 pm with newly assigned [facility], RNCM.   Heddy Shutter, RN, MSN, BSN, CCM [facility], Population Health Case Manager Phone: [phone]

## 2024-11-05 ENCOUNTER — Ambulatory Visit: Payer: Self-pay | Admitting: Physician Assistant

## 2024-11-06 NOTE — Assessment & Plan Note (Signed)
 Obstructive sleep apnea Diagnosed with mild obstructive sleep apnea. CPAP not tolerated due to gastrointestinal discomfort. Scheduled for mouth guard evaluation. - Proceed with dental evaluation for mouth guard. - Consider alternative treatments if mouth guard is ineffective.

## 2024-11-06 NOTE — Assessment & Plan Note (Signed)
 Aftercare following knee joint replacement surgery Post-operative recovery with significant pain, swelling, and limited range of motion. Engaged in home physical therapy and using a CPM machine. Pain managed with Dilaudid  and muscle relaxers. - Continue home physical therapy and CPM machine use. - Adjust CPM machine settings daily to improve range of motion. - Use ice machine to manage swelling and heat.

## 2024-11-07 ENCOUNTER — Ambulatory Visit: Admitting: Cardiovascular Disease

## 2024-11-09 NOTE — Telephone Encounter (Signed)
 Results Disclosure Attempt  I attempted to contact Laura  [name]  to disclose their genetic test results, they were not available and a message was left requesting a call back at their convenience.   [name], MS, CGC  Certified Genetic Counselor  Email: [email or web address]  Phone: [phone]

## 2024-11-15 ENCOUNTER — Ambulatory Visit (HOSPITAL_BASED_OUTPATIENT_CLINIC_OR_DEPARTMENT_OTHER): Admitting: Family

## 2024-11-15 ENCOUNTER — Encounter (HOSPITAL_BASED_OUTPATIENT_CLINIC_OR_DEPARTMENT_OTHER): Payer: Self-pay | Admitting: Family

## 2024-11-15 VITALS — BP 128/78 | HR 56 | Ht 65.0 in | Wt 183.0 lb

## 2024-11-15 DIAGNOSIS — I10 Essential (primary) hypertension: Secondary | ICD-10-CM

## 2024-11-15 DIAGNOSIS — G4733 Obstructive sleep apnea (adult) (pediatric): Secondary | ICD-10-CM

## 2024-11-15 MED ORDER — DOXAZOSIN MESYLATE 2 MG PO TABS: ORAL_TABLET | ORAL | 2 refills | Status: AC

## 2024-11-15 MED ORDER — CARVEDILOL 12.5 MG PO TABS: ORAL_TABLET | ORAL | 1 refills | Status: AC

## 2024-11-15 NOTE — Patient Instructions (Addendum)
 Medication Instructions:  START Doxazosin  2mg  as needed for blood pressure more than 150. You can take up to once per day.  *If you need a refill on your cardiac medications before your next appointment, please call your pharmacy*  Follow-Up: At [facility], you and your health needs are our priority.  As part of our continuing mission to provide you with exceptional heart care, our providers are all part of one team.  This team includes your primary Cardiologist (physician) and Advanced Practice Providers or APPs (Physician Assistants and Nurse Practitioners) who all work together to provide you with the care you need, when you need it.  Your next appointment:   As scheduled with Dr. Darron AND  In [facility] in 3-4 months  We recommend signing up for the patient portal called MyChart.  Sign up information is provided on this After Visit Summary.  MyChart is used to connect with patients for Virtual Visits (Telemedicine).  Patients are able to view lab/test results, encounter notes, upcoming appointments, etc.  Non-urgent messages can be sent to your provider as well.   To learn more about what you can do with MyChart, go to [email or web address].   Other Instructions  Dr. Melba for dental device for sleep apnea Address: [address], [place], [place] [address] Phone: [phone]  To prevent or reduce lower extremity swelling: Eat a low salt diet. Salt makes the body hold onto extra fluid which causes swelling. Sit with legs elevated. For example, in the recliner or on an ottoman.  Wear knee-high compression stockings during the daytime. Ones labeled 15-20 mmHg provide good compression.  To prevent palpitations: Make sure you are adequately hydrated.  Avoid and/or limit caffeine containing beverages like soda or tea. Exercise regularly.  Manage stress well. Some over the counter medications can cause palpitations such as Benadryl , AdvilPM,  TylenolPM. Regular Advil  or Tylenol  do not cause palpitations.
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
## 2024-11-16 ENCOUNTER — Encounter: Attending: Physical Medicine and Rehabilitation | Admitting: Physical Medicine and Rehabilitation

## 2024-11-16 ENCOUNTER — Encounter: Payer: Self-pay | Admitting: Physical Medicine and Rehabilitation

## 2024-11-16 VITALS — BP 127/80 | HR 62 | Ht 65.0 in | Wt 183.0 lb

## 2024-11-16 DIAGNOSIS — G8929 Other chronic pain: Secondary | ICD-10-CM | POA: Insufficient documentation

## 2024-11-16 DIAGNOSIS — G894 Chronic pain syndrome: Secondary | ICD-10-CM | POA: Insufficient documentation

## 2024-11-16 DIAGNOSIS — M1711 Unilateral primary osteoarthritis, right knee: Secondary | ICD-10-CM | POA: Diagnosis not present

## 2024-11-16 DIAGNOSIS — M25511 Pain in right shoulder: Secondary | ICD-10-CM | POA: Diagnosis not present

## 2024-11-16 DIAGNOSIS — Z96651 Presence of right artificial knee joint: Secondary | ICD-10-CM | POA: Insufficient documentation

## 2024-11-16 DIAGNOSIS — M545 Low back pain, unspecified: Secondary | ICD-10-CM | POA: Insufficient documentation

## 2024-11-16 DIAGNOSIS — M25512 Pain in left shoulder: Secondary | ICD-10-CM | POA: Insufficient documentation

## 2024-11-16 MED ORDER — HYDROMORPHONE HCL 2 MG PO TABS: 2.0000 mg | ORAL_TABLET | Freq: Four times a day (QID) | ORAL | 0 refills | Status: AC | PRN

## 2024-11-16 NOTE — Patient Instructions (Signed)
 Pt is a 59 yr old female with hx of HTN, moderate persistent asthma; hypothyroidism- latest TSH 1.41, fibromyalgia, Migraines, and BMI 31; Told she has Kidney disease? Thinks due to swelling up so much- -  Latest BUN/Cr 22/0.77.  Has many allergies 30 meds- also has IBS Here for evaluation for pain.  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  Also has trochanteric bursitis.  F/U on chronic pain syndrome.      For incision itching- Suggest using lidocaine  roll on has at home- OR lidocaine  patch- cut in middle of patch 1/4 way so when knee bends, patch bends on each side.    Set mirror up so L leg looks like R leg-  mirror therapy and scratch L leg. Great for nerve pain.    3.   Last refill of Tizanidine - 2-4 mg q6 hours prn- has refills since 10/25.     4. Con't Dilaudid   2 mg- will increase to 4 tabs/day for now- as needed and use for 1-2 months, then go back to prior dosage of 3 tabs/day/#90.    5.  Suggest against reverse shoulder replacement if possible, because usually 10 lb weight limit lifetime. If he thinks he can do with a repair, then go for it, because not a lot of arthritis in R shoulder.  Since  will likely need cane/RW in long term due to insurance.  And works at [employer]- and needs to lift 40-50 lbs at a time.   6.  Oral drug screen done 10/25, so is not due today per clinic policy   7. Signed DMV handicapped placard for pt- and filled it out.    8. F/U in  3 months since doesn't usually take pain meds regularly- only taking more right now, because of R TKR- pt made aware that if she gets filled more regularly, will need to be seen q2 months- alternating with myself and NP.
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
## 2024-11-18 NOTE — Discharge Summary (Signed)
 Physician Discharge Summary      Patient ID: Laura [name] MRN: [id number] DOB/AGE: 1966-01-22 59 y.o.  Admit date: 10/16/2024 Discharge date: 10/22/2024  Admission Diagnoses:  Principal Problem:   S/P total knee replacement, right   Discharge Diagnoses:  Same  Surgeries: Procedures: ARTHROPLASTY, KNEE, TOTAL on 10/16/2024   Consultants: Treatment Team:  Danton Reyes DASEN, MD  Discharged Condition: [facility] Course: Laura [name] is an 59 y.o. female who was admitted 10/16/2024 with a chief complaint of right knee pain, and found to have a diagnosis of right knee arthritis.  They were brought to the operating room on 10/16/2024 and underwent the above named procedures.  Pt awoke from anesthesia without complication and was transferred to the floor. On POD1, patient had a lot of difficulty with pain control.  She also had hypokalemia with potassium with a [place] of 2.2.  Hospitalist was consulted for optimal regimen if her potassium and there were no complications from this.  She had no red flag signs or symptoms throughout her stay in terms of any other pathologies that required consultation..  She required extended hospitalization due to difficulties with mobilization as well as pain control.  She eventually improved enough to be discharged on 12/22.  Pt will f/u with Dr. Addie in clinic in ~1-2 weeks.   Antibiotics given:  Anti-infectives (From admission, onward)    Start     Dose/Rate Route Frequency Ordered Stop   10/16/24 1600  ceFAZolin  (ANCEF ) IVPB 2g/100 mL premix        2 g 200 mL/hr over 30 Minutes Intravenous Every 8 hours 10/16/24 1500 10/16/24 2135   10/16/24 1130  vancomycin  (VANCOCIN ) powder  Status:  Discontinued          As needed 10/16/24 1130 10/16/24 1331   10/16/24 0715  ceFAZolin  (ANCEF ) IVPB 2g/100 mL premix        2 g 200 mL/hr over 30 Minutes Intravenous On call to O.R. 10/16/24 0710 10/16/24 1120     .  Recent vital signs:  Vitals:    10/22/24 0436 10/22/24 0757  BP: 105/69 114/89  Pulse: 78 73  Resp: 18 18  Temp: 98.2 F (36.8 C) 97.6 F (36.4 C)  SpO2: 95% 94%    Recent laboratory studies:  Results for orders placed or performed during the hospital encounter of 10/16/24  Glucose, capillary   Collection Time: 10/16/24  7:31 AM  Result Value Ref Range   Glucose-Capillary 140 (H) 70 - 99 mg/dL  Glucose, capillary   Collection Time: 10/16/24  9:48 AM  Result Value Ref Range   Glucose-Capillary 128 (H) 70 - 99 mg/dL  Glucose, capillary   Collection Time: 10/16/24  1:17 PM  Result Value Ref Range   Glucose-Capillary 141 (H) 70 - 99 mg/dL  Glucose, capillary   Collection Time: 10/16/24  2:40 PM  Result Value Ref Range   Glucose-Capillary 148 (H) 70 - 99 mg/dL  Basic metabolic panel   Collection Time: 10/17/24  5:25 AM  Result Value Ref Range   Sodium 137 135 - 145 mmol/L   Potassium 3.3 (L) 3.5 - 5.1 mmol/L   Chloride 96 (L) 98 - 111 mmol/L   CO2 24 22 - 32 mmol/L   Glucose, Bld 161 (H) 70 - 99 mg/dL   BUN 17 6 - 20 mg/dL   Creatinine, Ser 9.24 0.44 - 1.00 mg/dL   Calcium  9.2 8.9 - 10.3 mg/dL   GFR, Estimated >39 >39 mL/min  Anion gap 16 (H) 5 - 15  Basic metabolic panel   Collection Time: 10/18/24  3:16 AM  Result Value Ref Range   Sodium 138 135 - 145 mmol/L   Potassium 2.2 (LL) 3.5 - 5.1 mmol/L   Chloride 93 (L) 98 - 111 mmol/L   CO2 34 (H) 22 - 32 mmol/L   Glucose, Bld 126 (H) 70 - 99 mg/dL   BUN 14 6 - 20 mg/dL   Creatinine, Ser 9.39 0.44 - 1.00 mg/dL   Calcium  8.9 8.9 - 10.3 mg/dL   GFR, Estimated >39 >39 mL/min   Anion gap 11 5 - 15  Basic metabolic panel   Collection Time: 10/18/24 10:50 AM  Result Value Ref Range   Sodium 135 135 - 145 mmol/L   Potassium 2.8 (L) 3.5 - 5.1 mmol/L   Chloride 90 (L) 98 - 111 mmol/L   CO2 33 (H) 22 - 32 mmol/L   Glucose, Bld 160 (H) 70 - 99 mg/dL   BUN 11 6 - 20 mg/dL   Creatinine, Ser 9.36 0.44 - 1.00 mg/dL   Calcium  9.0 8.9 - 10.3 mg/dL   GFR,  Estimated >39 >39 mL/min   Anion gap 12 5 - 15  Magnesium    Collection Time: 10/18/24 10:50 AM  Result Value Ref Range   Magnesium  1.7 1.7 - 2.4 mg/dL  Basic metabolic panel   Collection Time: 10/18/24  5:43 PM  Result Value Ref Range   Sodium 138 135 - 145 mmol/L   Potassium 3.0 (L) 3.5 - 5.1 mmol/L   Chloride 94 (L) 98 - 111 mmol/L   CO2 33 (H) 22 - 32 mmol/L   Glucose, Bld 122 (H) 70 - 99 mg/dL   BUN 10 6 - 20 mg/dL   Creatinine, Ser 9.40 0.44 - 1.00 mg/dL   Calcium  8.8 (L) 8.9 - 10.3 mg/dL   GFR, Estimated >39 >39 mL/min   Anion gap 11 5 - 15  Basic metabolic panel with GFR   Collection Time: 10/19/24  4:31 AM  Result Value Ref Range   Sodium 138 135 - 145 mmol/L   Potassium 3.5 3.5 - 5.1 mmol/L   Chloride 98 98 - 111 mmol/L   CO2 33 (H) 22 - 32 mmol/L   Glucose, Bld 127 (H) 70 - 99 mg/dL   BUN 9 6 - 20 mg/dL   Creatinine, Ser 9.38 0.44 - 1.00 mg/dL   Calcium  8.7 (L) 8.9 - 10.3 mg/dL   GFR, Estimated >39 >39 mL/min   Anion gap 8 5 - 15  Magnesium    Collection Time: 10/19/24  4:31 AM  Result Value Ref Range   Magnesium  2.2 1.7 - 2.4 mg/dL  Magnesium    Collection Time: 10/20/24  6:36 AM  Result Value Ref Range   Magnesium  2.0 1.7 - 2.4 mg/dL  Basic metabolic panel with GFR   Collection Time: 10/20/24  9:12 AM  Result Value Ref Range   Sodium 140 135 - 145 mmol/L   Potassium 4.1 3.5 - 5.1 mmol/L   Chloride 103 98 - 111 mmol/L   CO2 30 22 - 32 mmol/L   Glucose, Bld 147 (H) 70 - 99 mg/dL   BUN 13 6 - 20 mg/dL   Creatinine, Ser 9.46 0.44 - 1.00 mg/dL   Calcium  8.8 (L) 8.9 - 10.3 mg/dL   GFR, Estimated >39 >39 mL/min   Anion gap 7 5 - 15  CBC   Collection Time: 10/20/24  3:39 PM  Result Value  Ref Range   WBC 7.7 4.0 - 10.5 K/uL   RBC 3.62 (L) 3.87 - 5.11 MIL/uL   Hemoglobin 10.0 (L) 12.0 - 15.0 g/dL   HCT 68.7 (L) 63.9 - 53.9 %   MCV 86.2 80.0 - 100.0 fL   MCH 27.6 26.0 - 34.0 pg   MCHC 32.1 30.0 - 36.0 g/dL   RDW 85.3 88.4 - 84.4 %   Platelets 297 150 -  400 K/uL   nRBC 0.0 0.0 - 0.2 %  Basic metabolic panel with GFR   Collection Time: 10/21/24  6:05 AM  Result Value Ref Range   Sodium 139 135 - 145 mmol/L   Potassium 3.8 3.5 - 5.1 mmol/L   Chloride 103 98 - 111 mmol/L   CO2 26 22 - 32 mmol/L   Glucose, Bld 125 (H) 70 - 99 mg/dL   BUN 13 6 - 20 mg/dL   Creatinine, Ser 9.48 0.44 - 1.00 mg/dL   Calcium  9.9 8.9 - 10.3 mg/dL   GFR, Estimated >39 >39 mL/min   Anion gap 10 5 - 15  Magnesium    Collection Time: 10/21/24  6:05 AM  Result Value Ref Range   Magnesium  1.6 (L) 1.7 - 2.4 mg/dL  CBC   Collection Time: 10/21/24  6:05 AM  Result Value Ref Range   WBC 6.5 4.0 - 10.5 K/uL   RBC 3.66 (L) 3.87 - 5.11 MIL/uL   Hemoglobin 10.2 (L) 12.0 - 15.0 g/dL   HCT 68.9 (L) 63.9 - 53.9 %   MCV 84.7 80.0 - 100.0 fL   MCH 27.9 26.0 - 34.0 pg   MCHC 32.9 30.0 - 36.0 g/dL   RDW 85.2 88.4 - 84.4 %   Platelets 312 150 - 400 K/uL   nRBC 0.0 0.0 - 0.2 %  Magnesium    Collection Time: 10/22/24  3:33 AM  Result Value Ref Range   Magnesium  1.7 1.7 - 2.4 mg/dL  Basic metabolic panel with GFR   Collection Time: 10/22/24  3:33 AM  Result Value Ref Range   Sodium 140 135 - 145 mmol/L   Potassium 4.4 3.5 - 5.1 mmol/L   Chloride 101 98 - 111 mmol/L   CO2 32 22 - 32 mmol/L   Glucose, Bld 117 (H) 70 - 99 mg/dL   BUN 12 6 - 20 mg/dL   Creatinine, Ser 9.43 0.44 - 1.00 mg/dL   Calcium  10.2 8.9 - 10.3 mg/dL   GFR, Estimated >39 >39 mL/min   Anion gap 6 5 - 15   *Note: Due to a large number of results and/or encounters for the requested time period, some results have not been displayed. A complete set of results can be found in Results Review.    Discharge Medications:   Allergies as of 10/22/2024       Reactions   Meperidine Hives, Rash   Minoxidil  Swelling   Prednisone  Anxiety, Other (See Comments)   Severe high anxiety   Shellfish Allergy  Shortness Of Breath, Swelling   Shellfish Protein-containing Drug Products Anaphylaxis   Diltiazem   Swelling   Singulair  [montelukast ] Swelling   Swelling all over.    Zetia  [ezetimibe ] Swelling   Swelling of face.    Acebutolol  Swelling   Amlodipine  Swelling      Celebrex  [celecoxib ] Swelling   Patient began taking for knee pain and started swelling (hands, feet, face).    Dexlansoprazole  Other (See Comments), Nausea And Vomiting   Abdominal pain   Dronedarone  Swelling, Other (See Comments)  Duloxetine  Hcl Other (See Comments)   Made pt feel crazy   Flecainide  Swelling   Lisinopril  Other (See Comments)   Swelling   Metoprolol  Other (See Comments), Swelling   Mexiletine    Swelling - hands, legs, face   Omeprazole  Other (See Comments), Nausea And Vomiting   Abdominal pain   Pregabalin  Other (See Comments)   twitch   Savella [milnacipran]    Depression   Sectral  [acebutolol  Hcl] Swelling   Statins Other (See Comments)   Muscle pain   Tramadol  Nausea Only   Unable to sleep, makes her itch   Valsartan  Swelling   malaise, fatigue, swelling.   Codeine Hives, Nausea And Vomiting, Rash   Hydralazine  Palpitations   Hydrocodone  Other (See Comments), Rash   Keeps patient awake.   Ketorolac  Tromethamine  Itching, Rash   Losartan  Rash   Swelling   Mirtazapine  Swelling, Rash   Oxycodone  Itching, Rash        Medication List     STOP taking these medications    carvedilol  12.5 MG tablet Commonly known as: COREG    HYDROmorphone  2 MG tablet Commonly known as: DILAUDID    methylPREDNISolone  4 MG tablet Commonly known as: MEDROL    prazosin  2 MG capsule Commonly known as: MINIPRESS        TAKE these medications    Accu-Chek Guide test strip Generic drug: glucose blood 1 each by Other route daily in the afternoon. Use as instructed   albuterol  108 (90 Base) MCG/ACT inhaler Commonly known as: VENTOLIN  HFA Inhale 2 puffs into the lungs every 4 (four) hours as needed for wheezing or shortness of breath.   bisacodyl  5 MG EC tablet Generic drug: bisacodyl  Take 5  mg by mouth daily as needed for moderate constipation.   Breztri  Aerosphere 160-9-4.8 MCG/ACT Aero inhaler Generic drug: budesonide -glycopyrrolate -formoterol  Inhale 2 puffs into the lungs 2 (two) times daily.   chlorthalidone  25 MG tablet Commonly known as: HYGROTON  Take 0.5 tablets (12.5 mg total) by mouth every morning. What changed: Another medication with the same name was removed. Continue taking this medication, and follow the directions you see here.   docusate sodium  100 MG capsule Commonly known as: COLACE Take 1 capsule (100 mg total) by mouth 2 (two) times daily.   furosemide  20 MG tablet Commonly known as: LASIX  Take 1 tablet (20 mg total) by mouth 2 (two) times daily.   ipratropium-albuterol  0.5-2.5 (3) MG/3ML Soln Commonly known as: DUONEB Take 3 mLs by nebulization every 4 (four) hours as needed.   levothyroxine  88 MCG tablet Commonly known as: SYNTHROID  Take 1 tablet (88 mcg total) by mouth daily. What changed: additional instructions   magnesium  gluconate 500 (27 Mg) MG Tabs tablet Commonly known as: MAGONATE Take 1 tablet (500 mg total) by mouth 2 (two) times daily.   nystatin  powder Apply 1 Application topically 2 (two) times daily as needed (for skin irritation- affected areas).   ondansetron  4 MG disintegrating tablet Commonly known as: ZOFRAN -ODT Take 1 tablet (4 mg total) by mouth every 8 (eight) hours as needed for nausea or vomiting.   Opzelura  1.5 % Crea Generic drug: Ruxolitinib  Phosphate Apply sparingly twice daily if needed   Ozempic  (1 MG/DOSE) 4 MG/3ML Sopn Generic drug: Semaglutide  (1 MG/DOSE) Inject 1 mg into the skin once a week.   potassium chloride  20 MEQ/15ML (10%) Soln Take 30 mLs (40 mEq total) by mouth 3 (three) times daily.   promethazine  25 MG tablet Commonly known as: PHENERGAN  Take 1 tablet (25 mg total)  by mouth every 6 (six) hours as needed for nausea or vomiting.   Repatha  SureClick 140 MG/ML Soaj Generic drug:  Evolocumab  Inject 140 mg into the skin every 14 (fourteen) days.   tiZANidine  2 MG tablet Commonly known as: ZANAFLEX  Take 1-2 tablets (2-4 mg total) by mouth every 6 (six) hours as needed for muscle spasms.   Ubrelvy  100 MG Tabs Generic drug: Ubrogepant  Take 1 tablet (100 mg total) by mouth 2 (two) times daily as needed (Take one and if symptoms persist after 2 hours take a second one. Do not take more than 2 in 24 hours).   Vitamin D  (Ergocalciferol ) 1.25 MG (50000 UNIT) Caps capsule Commonly known as: DRISDOL  Take 1 capsule (50,000 Units total) by mouth every 7 (seven) days.   Voquezna  10 MG Tabs Generic drug: Vonoprazan Fumarate  Take 10 mg by mouth daily.   zolpidem  10 MG tablet Commonly known as: AMBIEN  Take 1 tablet (10 mg total) by mouth at bedtime.        Diagnostic Studies: XR Knee 1-2 Views Right Result Date: 10/31/2024 AP lateral radiographs right knee reviewed.  Total knee prosthesis in good position alignment with no complicating features   Disposition: Discharge disposition: 01-Home or Self Care       Discharge Instructions     Call MD / Call 911   Complete by: As directed    If you experience chest pain or shortness of breath, CALL 911 and be transported to the hospital emergency room.  If you develope a fever above 101 F, pus (white drainage) or increased drainage or redness at the wound, or calf pain, call your surgeon's office.   Constipation Prevention   Complete by: As directed    Drink plenty of fluids.  Prune juice may be helpful.  You may use a stool softener, such as Colace (over the counter) 100 mg twice a day.  Use MiraLax  (over the counter) for constipation as needed.   Discharge instructions   Complete by: As directed    From Dr. Addie  1.  Use CPM machine 1 to 1-1/2 hours 3 times a day- -increase the degrees daily as tolerated 2.  Weightbearing as tolerated with walker 3.  Okay to shower dressing is waterproof 4.  Continue to work on  straight leg raising exercises   Increase activity slowly as tolerated   Complete by: As directed    Post-operative opioid taper instructions:   Complete by: As directed    POST-OPERATIVE OPIOID TAPER INSTRUCTIONS: It is important to wean off of your opioid medication as soon as possible. If you do not need pain medication after your surgery it is ok to stop day one. Opioids include: Codeine, Hydrocodone (Norco, Vicodin), Oxycodone (Percocet, oxycontin ) and hydromorphone  amongst others.  Long term and even short term use of opiods can cause: Increased pain response Dependence Constipation Depression Respiratory depression And more.  Withdrawal symptoms can include Flu like symptoms Nausea, vomiting And more Techniques to manage these symptoms Hydrate well Eat regular healthy meals Stay active Use relaxation techniques(deep breathing, meditating, yoga) Do Not substitute Alcohol to help with tapering If you have been on opioids for less than two weeks and do not have pain than it is ok to stop all together.  Plan to wean off of opioids This plan should start within one week post op of your joint replacement. Maintain the same interval or time between taking each dose and first decrease the dose.  Cut the total daily intake of opioids  by one tablet each day Next start to increase the time between doses. The last dose that should be eliminated is the evening dose.           Contact information for follow-up providers     Milon Cleaves, [name] Follow up.   Specialty: Physician Assistant Contact information: [address] [place] [address] [phone]              Contact information for after-discharge care     Home Medical Care     [facility]) .   Service: Home Health Services Why: [facility] will provide home health services, start of care within 48 hours post discharge Contact information: [address]  [place] [address] [place]  [phone]                      Signed: Herlene Calix 11/18/2024, 5:59 PM

## 2024-11-19 NOTE — Telephone Encounter (Signed)
 I contacted  Laura [name] to discuss her genetic testing results. No pathogenic variants were identified in the 77 genes analyzed. Discussed that we do not know why she has a family history of cancer. It could be due to a different gene that we are not testing, or maybe our current technology may not be able to pick something up.  It will be important for her to keep in contact with genetics to keep up with whether additional testing may be needed.Detailed clinic note to follow.  Additionally genetic test was also completed for familial hypercortisolemia which was negative. Individual can still have a clinical diagnosis of FH without having an pathogenic variant in one of the associated genes.   Laura [name] will review their results and results disclosure note via MyChart.   The test report will be scanned into EPIC and will be located under the Molecular Pathology section of the Results Review tab.  A portion of the result report is included below for reference.  CancerNext- Expanded+RNA Test Report   Familial Hypercholesterolemia Test Report    Laura Fryer, MS, CGC  Certified Genetic Counselor  Email: [email or web address]  Phone: [phone]

## 2024-11-20 ENCOUNTER — Other Ambulatory Visit: Payer: Self-pay

## 2024-11-20 ENCOUNTER — Ambulatory Visit: Payer: Self-pay

## 2024-11-20 DIAGNOSIS — Z79899 Other long term (current) drug therapy: Secondary | ICD-10-CM

## 2024-11-20 NOTE — Patient Instructions (Signed)
 Visit Information  Thank you for taking time to visit with me today. Please don't hesitate to contact me if I can be of assistance to you before our next scheduled appointment.  Your next care management appointment is by telephone on 12/13/2024 at 0930   Please call the care guide team at [phone] if you need to cancel, schedule, or reschedule an appointment.   Please call the [place]  National Suicide Prevention Lifeline: (289) 780-9248 or TTY: 3522455537 TTY (910)757-0996) to talk to a trained counselor if you are experiencing a Mental Health or Behavioral Health Crisis or need someone to talk to.  Elida Pulse, RNCM Case Manager [facility], Population Health Direct Dial: [phone]

## 2024-11-20 NOTE — Patient Outreach (Addendum)
 Complex Care Management   Visit Note  11/20/2024  Name:  Laura [name] MRN: [id number] DOB: 08/04/1966  Situation: Referral received for Complex Care Management related to Hypertention I obtained verbal consent from Patient.  Visit completed with Laura [name]  on the phone  Background:   Past Medical History:  Diagnosis Date   Acute GI bleeding 09/01/2019   Anxiety and depression    Arthritis    Asthma    Barrett's esophagus 11/02/2016   Dr. Avram, GI   Bowel obstruction [facility])    Chest pain    a. 01/2016 Ex MV: Hypertensive response. Freq PVCs w/ exercise. nl EF. No ST/T changes. No ischemia.   Complex ovarian cyst, left 03/08/2017   COVID    COVID-19 10/2019   Cystocele    Diabetes mellitus without complication (HCC)    Exposure to hepatitis C    Fibromyalgia    GERD (gastroesophageal reflux disease)    Hashimoto's disease    Heart murmur    a. 03/2016 Echo: EF 60-65%, no rwma, mild MR, nl LA size, nl RV fxn.   Herpes zoster without complication 10/11/2021   High cholesterol    History of hiatal hernia    History of kidney stones    Hypertension    Myofascial pain syndrome    Nasal septal perforation 05/12/2018   Hx of cocaine use   Palpitations    a. 03/2016 Holter: Sinus rhythm, avg HR 83, max 123, min 64. 4 PACs. 10,356 isolated PVCs, one vent couplet, 3842 V bigeminy, 4 beats NSVT->prev on BB - dc 2/2 swelling.   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 Took metformin to try to lessen   Raynaud disease    Rectocele    Shingles    Sleep apnea    mild per pt   Status post hysterectomy 03/08/2017   Telangiectasia macularis eruptiva perstans    Torn rotator cuff    left   Urinary retention with incomplete bladder emptying    Vaginal dryness, menopausal    Vaginal enterocele    Vitamin B12 deficiency 07/26/2016   Vitamin D  deficiency 12/03/2014   Wears hearing aid in both ears      Assessment:  Patient has returned to her home. Saw Physical Medicine on 11/16/2024. Patient reports her pain is well controlled and she is not taking pain meds as often. Blood pressure at home today =115/75 with HR of 64. She confirms Cardiology ([facility]) gave her a new medication Doxazosin  Mesylate to use as needed for BP > 150. Reviewed instructions per note from 11/16/2024. Patient has not had a BP reading high enough to take yet but she understands when and how to take. Confirms she is taking her BPs at home and recording so she can share with [facility]. She reports she is doing well post Right Knee Replacement. Home PT has ended and she hopes to start outpatient PT once she sees Orthopedic surgeon on 11/23/2024. She confirmed she is still doing the exercises instructed by Home PT therapist. She continues to use her walker but hopes to graduate to a cane after Orthopedic surgeon visit. Patient needs refill of her EpiPen  which has expired. She had them filled but cannot afford the cost of $278. Will refer to pharmacy for assistance. Laura [name] denies any other new or worsening health concerns today.   Patient Reported Symptoms:  Cognitive Cognitive Status: No  symptoms reported, Alert and oriented to person, place, and time, Insightful and able to interpret abstract concepts, Normal speech and language skills   Health Maintenance Behaviors: Annual physical exam, Sleep adequate, Healthy diet Health Facilitated by: Pain control, Healthy diet, Rest  Neurological Neurological Review of Symptoms: No symptoms reported Neurological Management Strategies: Adequate rest, Medication therapy, Routine screening Neurological Self-Management Outcome: 4 (good)  HEENT HEENT Symptoms Reported: No symptoms reported HEENT Management Strategies: Routine screening HEENT Self-Management Outcome: 4 (good)    Cardiovascular Cardiovascular Symptoms Reported: Swelling in legs or feet Does patient have uncontrolled  Hypertension?: Yes Is patient checking Blood Pressure at home?: Yes Patient's Recent BP reading at home: 115/75 pt reported from yesterday. Patient is keeping a BP log so she can take to [facility] appts. Cardiovascular Management Strategies: Adequate rest, Medication therapy, Fluid modification, Routine screening Cardiovascular Self-Management Outcome: 4 (good)  Respiratory Respiratory Symptoms Reported: No symptoms reported Additional Respiratory Details: has been refered for oral OSA device Respiratory Management Strategies: Adequate rest, Routine screening, Coping strategies Respiratory Self-Management Outcome: 4 (good)  Endocrine Endocrine Symptoms Reported: No symptoms reported Is patient diabetic?: Yes Is patient checking blood sugars at home?: No Endocrine Self-Management Outcome: 4 (good) Endocrine Comment: Last A1c on 10/30/2024= 6.3  Gastrointestinal Gastrointestinal Symptoms Reported: No symptoms reported Additional Gastrointestinal Details: not taking pain meds as often and constipaion has been well managed with Miralax  as needed. Also trying to drink more water . Gastrointestinal Management Strategies: Adequate rest, Fluid modification, Medication therapy Gastrointestinal Self-Management Outcome: 4 (good)    Genitourinary Genitourinary Symptoms Reported: No symptoms reported    Integumentary Integumentary Symptoms Reported: Incision Additional Integumentary Details: Denies any drainage, redness or dehiscence of surgical incision. Skin Management Strategies: Routine screening Skin Self-Management Outcome: 4 (good)  Musculoskeletal Musculoskelatal Symptoms Reviewed: Joint pain, Limited mobility, Muscle pain Additional Musculoskeletal Details: Recent Right Knee replacement. HH PT is over and patient will see Ortho next week and begin outpatient PT. She continues to do the exercises she was doing with home PT. Musculoskeletal Management Strategies: Medical device, Medication  therapy, Routine screening, Exercise, Adequate rest Musculoskeletal Self-Management Outcome: 4 (good)      Psychosocial Psychosocial Symptoms Reported: No symptoms reported          11/20/2024    PHQ2-9 Depression Screening   Little interest or pleasure in doing things    Feeling down, depressed, or hopeless    PHQ-2 - Total Score    Trouble falling or staying asleep, or sleeping too much    Feeling tired or having little energy    Poor appetite or overeating     Feeling bad about yourself - or that you are a failure or have let yourself or your family down    Trouble concentrating on things, such as reading the newspaper or watching television    Moving or speaking so slowly that other people could have noticed.  Or the opposite - being so fidgety or restless that you have been moving around a lot more than usual    Thoughts that you would be better off dead, or hurting yourself in some way    PHQ2-9 Total Score    If you checked off any problems, how difficult have these problems made it for you to do your work, take care of things at home, or get along with other people    Depression Interventions/Treatment      There were no vitals filed for this visit. Pain Score: 4  Pain Type: Surgical  pain Pain Location: Knee Pain Orientation: Right  Medications Reviewed Today   Medications were not reviewed in this encounter     Recommendation:   Referral to: Pharmacy for assistance with cost of EpiPen  Continue Current Plan of Care  Follow Up Plan:   Telephone follow up appointment date/time:  12/13/2024 at 9:30 am  Elida Pulse, [facility] Case Manager [facility], [facility] Direct Dial: [phone]

## 2024-11-21 ENCOUNTER — Encounter: Admitting: Physical Medicine and Rehabilitation

## 2024-11-23 ENCOUNTER — Encounter: Payer: Self-pay | Admitting: Orthopedic Surgery

## 2024-11-23 ENCOUNTER — Encounter: Admitting: Surgical

## 2024-11-23 DIAGNOSIS — Z96651 Presence of right artificial knee joint: Secondary | ICD-10-CM

## 2024-11-26 ENCOUNTER — Telehealth: Payer: Self-pay

## 2024-11-26 NOTE — Progress Notes (Signed)
 Complex Care Management Note  Care Guide Note 11/26/2024 Name: YUNUEN MORDAN MRN: [id number] DOB: [birth year]  Landry DELENA Mccleese is a 59 y.o. year old female who sees [place], [place], [name] for primary care. I reached out to [facility] by phone today to offer complex care management services.  Ms. Millikin was given information about Complex Care Management services today including:   The Complex Care Management services include support from the care team which includes your Nurse Care Manager, Clinical Social Worker, or Pharmacist.  The Complex Care Management team is here to help remove barriers to the health concerns and goals most important to you. Complex Care Management services are voluntary, and the patient may decline or stop services at any time by request to their care team member.   Complex Care Management Consent Status: Patient agreed to services and verbal consent obtained.   Follow up plan:  Telephone appointment with complex care management team member scheduled for:  11/28/24 at 9:00 a.m.   Encounter Outcome:  Patient Scheduled  Dreama [facility]  [facility], [facility] VBCI Assistant Direct Dial: [phone]  Fax: [phone]

## 2024-11-27 ENCOUNTER — Ambulatory Visit: Admitting: Cardiovascular Disease

## 2024-11-27 ENCOUNTER — Ambulatory Visit: Admitting: Physician Assistant

## 2024-11-27 NOTE — Telephone Encounter (Signed)
 Okay to add into my schedule at some point in the next week so we can recheck her range of motion.  Okay to go to outpatient PT, can we send referral in for operative knee range of motion and strengthening status post total knee arthroplasty?

## 2024-11-27 NOTE — Telephone Encounter (Signed)
 PT referral placed for [facility]

## 2024-11-28 ENCOUNTER — Other Ambulatory Visit

## 2024-11-28 NOTE — Telephone Encounter (Signed)
 Referral faxed to [phone]

## 2024-11-28 NOTE — Telephone Encounter (Signed)
 thx

## 2024-11-28 NOTE — Progress Notes (Signed)
 "  11/28/2024 Name: Laura [name] MRN: [id number] DOB: May 28, 1966  No chief complaint on file.   Laura [name] is a 59 y.o. year old female who presented for a telephone visit.   They were referred to the pharmacist by the [facility] Complex Case Management program for assistance in managing medication access.    Subjective:  Care Team: Primary Care Provider: Milon Cleaves, [name] ; Next Scheduled Visit:  Future Appointments  Date Time Provider Department Center  12/03/2024  2:30 PM Magnant, Carlin CROME, PA-C [facility] None  12/04/2024 11:20 AM Jeannetta Lonni ORN, MD [facility] None  12/06/2024  2:45 PM Magnant, Carlin CROME, PA-C [facility] None  12/11/2024  9:00 AM Milon Cleaves, PA [facility]  12/13/2024  9:30 AM Viveca Elida NOVAK, RN [facility] None  01/02/2025  8:30 AM Haldeman-Englert, Chad, MD [facility] None  01/22/2025  8:20 AM Dartha Ernst, MD [facility] None  02/18/2025 10:20 AM Cornelio Bouchard, MD [facility]PR[facility] CPR  02/20/2025 10:40 AM Darron Deatrice DELENA, MD [facility] None  03/18/2025 10:05 AM Vannie Reche RAMAN, NP [facility]-CVD [phone] [place]    Medication Access/Adherence  Current Pharmacy:  [address] - [place], [place] - [address] [place] [address] Phone: [phone] Fax: [phone]  [facility] - [place], [place] - [address] [place] [address] Phone: [phone] Fax: [phone]  [id number] - [place], [place] - [address] AT [facility] [address] [facility] [address] & [address] [place] [address] Phone: [phone] Fax: [phone]  DARRYLE LONG - [facility] [address] [place] [address] Phone: [phone] Fax: [phone]   Patient reports affordability concerns with their medications: Yes  Patient reports access/transportation concerns to their pharmacy: No  Patient reports adherence concerns with their medications:  No      Primary concern is related to cost of epi pen. Would be $262 approximately.    Objective:  Lab Results  Component Value Date   HGBA1C 6.2 (H) 10/30/2024    Lab Results  Component Value Date   CREATININE 0.94 10/30/2024   BUN 17 10/30/2024   NA 138 10/30/2024   K 4.6 10/30/2024   CL 96 10/30/2024   CO2 27 10/30/2024    Lab Results  Component Value Date   CHOL 269 (H) 10/30/2024   HDL 50 10/30/2024   LDLCALC 169 (H) 10/30/2024   LDLDIRECT 222 (H) 04/26/2019   TRIG 268 (H) 10/30/2024   CHOLHDL 5.4 (H) 10/30/2024   Assessment/Plan:   Medication Assistance: - reviewed prescription plan information - would need to reach $440 Rx deductible then patient responsibility would be 18% (Tier 3 - preferred brand status for all epinephrine  products)   Plans to have repatha  inj refilled this month, and has [employer]well grant which will help with reaching her Rx deductible  - reviewed PAP options for epi pen - unfortunately because she has rx insurance she would not qualify   Follow Up Plan: f/u prn, patient has pharmacist contact information to reach out with any questions. Will coordinate order/renewal with [facility] mail order to have healthwell grant coordinated with next order.   Previously approved grant information below: [employer] Prescription ID: [id number] Group: [id number] PCN: [id number] BIN: [id number] Activation Date: 09/19/2024 Expiration Date: 08/19/2025 [employer] Program ID: [id number]   Lang Sieve, PharmD, BCGP Clinical Pharmacist  [phone]    "

## 2024-11-29 ENCOUNTER — Other Ambulatory Visit: Payer: Self-pay

## 2024-11-29 ENCOUNTER — Other Ambulatory Visit (HOSPITAL_COMMUNITY): Payer: Self-pay

## 2024-11-29 MED ORDER — REPATHA SURECLICK 140 MG/ML ~~LOC~~ SOAJ
140.0000 mg | SUBCUTANEOUS | 2 refills | Status: AC
  Filled 2024-11-29: qty 2, 28d supply, fill #0

## 2024-12-02 ENCOUNTER — Telehealth: Payer: Self-pay

## 2024-12-02 NOTE — Telephone Encounter (Signed)
 MYCHART SENT ABOUT R/S APPT

## 2024-12-03 ENCOUNTER — Encounter: Admitting: Surgical

## 2024-12-04 ENCOUNTER — Ambulatory Visit: Admitting: Internal Medicine

## 2024-12-06 ENCOUNTER — Ambulatory Visit: Admitting: Surgical

## 2024-12-07 ENCOUNTER — Other Ambulatory Visit: Payer: Self-pay | Admitting: Physician Assistant

## 2024-12-07 DIAGNOSIS — F5101 Primary insomnia: Secondary | ICD-10-CM

## 2024-12-11 ENCOUNTER — Ambulatory Visit: Admitting: Physician Assistant

## 2024-12-13 ENCOUNTER — Telehealth

## 2025-01-02 ENCOUNTER — Ambulatory Visit: Admitting: Medical Genetics

## 2025-01-04 ENCOUNTER — Encounter: Admitting: Surgical

## 2025-01-16 ENCOUNTER — Ambulatory Visit: Admitting: Internal Medicine

## 2025-01-22 ENCOUNTER — Ambulatory Visit: Admitting: "Endocrinology

## 2025-02-18 ENCOUNTER — Encounter: Admitting: Physical Medicine and Rehabilitation

## 2025-02-20 ENCOUNTER — Ambulatory Visit: Admitting: Cardiovascular Disease

## 2025-03-18 ENCOUNTER — Encounter (HOSPITAL_BASED_OUTPATIENT_CLINIC_OR_DEPARTMENT_OTHER): Admitting: Family
